# Patient Record
Sex: Male | Born: 1945 | Race: White | Hispanic: No | Marital: Married | Smoking: Former smoker
Health system: Southern US, Community
[De-identification: ages and names within clinical notes are randomized; demographics above are authoritative.]

## PROBLEM LIST (undated history)

## (undated) DIAGNOSIS — D649 Anemia, unspecified: Secondary | ICD-10-CM

## (undated) DIAGNOSIS — E46 Unspecified protein-calorie malnutrition: Secondary | ICD-10-CM

## (undated) DIAGNOSIS — J189 Pneumonia, unspecified organism: Secondary | ICD-10-CM

## (undated) DIAGNOSIS — I2699 Other pulmonary embolism without acute cor pulmonale: Secondary | ICD-10-CM

## (undated) DIAGNOSIS — Z8489 Family history of other specified conditions: Secondary | ICD-10-CM

## (undated) DIAGNOSIS — IMO0001 Reserved for inherently not codable concepts without codable children: Secondary | ICD-10-CM

## (undated) DIAGNOSIS — K269 Duodenal ulcer, unspecified as acute or chronic, without hemorrhage or perforation: Secondary | ICD-10-CM

## (undated) DIAGNOSIS — A0472 Enterocolitis due to Clostridium difficile, not specified as recurrent: Secondary | ICD-10-CM

## (undated) DIAGNOSIS — IMO0002 Reserved for concepts with insufficient information to code with codable children: Secondary | ICD-10-CM

## (undated) DIAGNOSIS — C3490 Malignant neoplasm of unspecified part of unspecified bronchus or lung: Secondary | ICD-10-CM

## (undated) DIAGNOSIS — K823 Fistula of gallbladder: Secondary | ICD-10-CM

## (undated) DIAGNOSIS — E119 Type 2 diabetes mellitus without complications: Secondary | ICD-10-CM

## (undated) DIAGNOSIS — E7800 Pure hypercholesterolemia, unspecified: Secondary | ICD-10-CM

## (undated) DIAGNOSIS — Z828 Family history of other disabilities and chronic diseases leading to disablement, not elsewhere classified: Secondary | ICD-10-CM

## (undated) DIAGNOSIS — K3184 Gastroparesis: Secondary | ICD-10-CM

## (undated) DIAGNOSIS — R945 Abnormal results of liver function studies: Secondary | ICD-10-CM

## (undated) DIAGNOSIS — C439 Malignant melanoma of skin, unspecified: Secondary | ICD-10-CM

## (undated) DIAGNOSIS — J449 Chronic obstructive pulmonary disease, unspecified: Secondary | ICD-10-CM

## (undated) DIAGNOSIS — R7989 Other specified abnormal findings of blood chemistry: Secondary | ICD-10-CM

## (undated) HISTORY — DX: Enterocolitis due to Clostridium difficile, not specified as recurrent: A04.72

## (undated) HISTORY — PX: PYLOROPLASTY: SHX418

## (undated) HISTORY — PX: OTHER SURGICAL HISTORY: SHX169

## (undated) HISTORY — DX: Pure hypercholesterolemia, unspecified: E78.00

## (undated) HISTORY — DX: Abnormal results of liver function studies: R94.5

## (undated) HISTORY — DX: Duodenal ulcer, unspecified as acute or chronic, without hemorrhage or perforation: K26.9

## (undated) HISTORY — DX: Pneumonia, unspecified organism: J18.9

## (undated) HISTORY — DX: Other pulmonary embolism without acute cor pulmonale: I26.99

## (undated) HISTORY — DX: Anemia, unspecified: D64.9

## (undated) HISTORY — PX: JEJUNOSTOMY FEEDING TUBE: SUR737

## (undated) HISTORY — DX: Unspecified protein-calorie malnutrition: E46

## (undated) HISTORY — PX: CHOLECYSTECTOMY: SHX55

## (undated) HISTORY — DX: Other specified abnormal findings of blood chemistry: R79.89

## (undated) HISTORY — DX: Malignant melanoma of skin, unspecified: C43.9

## (undated) HISTORY — DX: Fistula of gallbladder: K82.3

## (undated) HISTORY — DX: Chronic obstructive pulmonary disease, unspecified: J44.9

## (undated) HISTORY — DX: Type 2 diabetes mellitus without complications: E11.9

---

## 2014-06-27 ENCOUNTER — Telehealth: Payer: Self-pay | Admitting: Internal Medicine

## 2014-06-27 ENCOUNTER — Ambulatory Visit (INDEPENDENT_AMBULATORY_CARE_PROVIDER_SITE_OTHER): Payer: BC Managed Care – PPO | Admitting: Internal Medicine

## 2014-06-27 ENCOUNTER — Ambulatory Visit (INDEPENDENT_AMBULATORY_CARE_PROVIDER_SITE_OTHER)
Admission: RE | Admit: 2014-06-27 | Discharge: 2014-06-27 | Disposition: A | Discharge status: Home / Self Care | Payer: BC Managed Care – PPO | Source: Ambulatory Visit | Attending: Internal Medicine | Admitting: Internal Medicine

## 2014-06-27 ENCOUNTER — Encounter: Payer: Self-pay | Admitting: Internal Medicine

## 2014-06-27 VITALS — BP 114/76 | HR 88 | Temp 97.7°F | Ht 68.0 in | Wt 173.0 lb

## 2014-06-27 DIAGNOSIS — J189 Pneumonia, unspecified organism: Secondary | ICD-10-CM

## 2014-06-27 DIAGNOSIS — J449 Chronic obstructive pulmonary disease, unspecified: Secondary | ICD-10-CM

## 2014-06-27 MED ORDER — BUDESONIDE-FORMOTEROL FUMARATE 160-4.5 MCG/ACT IN AERO: INHALATION_SPRAY | RESPIRATORY_TRACT | Status: DC

## 2014-06-27 NOTE — Progress Notes (Signed)
   Subjective:    Patient ID: Daryl Vega, male    DOB: 1946/01/28  MRN: [id number]  HPI  76yowm quit smoking 06/12/14 referred by [facility] office 06/27/2014 for eval of ? Recurrent pna   06/27/2014 1st [place] [facility] office visit/ Tikia Skilton  Chief Complaint  Patient presents with  . Pulmonary Consult    Referred per Dr. [name].  Pt states that he has had PNA x 2 recently. He c/o cough and SOB for the past 2 months.  Cough is now non prod.   all started 8-10  weeks ago prior to that  No limitation then fiancee got pna then pt got sick rx levaquin for ? pna but still still tired cough /congestion with nasty mucus then seen again with flare cough/fever rx 06/13/14 doxy/prednisone Last fever over a week prior to [place] c/o tired,  Some cough/wheezing but that's  Much better and breathing just about back to baseline  No obvious other patterns in day to day or daytime variabilty or assoc chronic cough or cp or chest tightness, subjective wheeze overt sinus or hb symptoms. No unusual exp hx or h/o childhood pna/ asthma or knowledge of premature birth.  Sleeping ok without nocturnal  or early am exacerbation  of respiratory  c/o's or need for noct saba. Also denies any obvious fluctuation of symptoms with weather or environmental changes or other aggravating or alleviating factors except as outlined above   Current Medications, Allergies, Complete Past Medical History, Past Surgical History, Family History, and Social History were reviewed in [employer] record.           Review of Systems  Constitutional: Negative for fever, chills, activity change, appetite change and unexpected weight change.  HENT: Positive for congestion. Negative for dental problem, postnasal drip, rhinorrhea, sneezing, sore throat, trouble swallowing and voice change.   Eyes: Negative for visual disturbance.  Respiratory: Negative for cough, choking and shortness of breath.     Cardiovascular: Negative for chest pain and leg swelling.  Gastrointestinal: Negative for nausea, vomiting and abdominal pain.  Genitourinary: Negative for difficulty urinating.  Musculoskeletal: Negative for arthralgias.  Skin: Negative for rash.  Psychiatric/Behavioral: Negative for behavioral problems and confusion.       Objective:   Physical Exam  amb wm nad  Wt Readings from Last 3 Encounters:  06/27/14 173 lb (78.472 kg)      HEENT mild turbinate edema.  Edentulous Oropharynx no thrush or excess pnd or cobblestoning.  No JVD or cervical adenopathy. Mild accessory muscle hypertrophy. Trachea midline, nl thryroid. Chest was hyperinflated by percussion with diminished breath sounds and moderate increased exp time without wheeze. Hoover sign positive at mid inspiration. Regular rate and rhythm without murmur gallop or rub or increase P2 or edema.  Abd: no hsm, nl excursion. Ext warm without cyanosis or clubbing.    cxr 06/13/14  Report: infiltrate thruout the RUL  CXR  06/27/2014 : Vague Ill-defined density in the medial right upper lung. A lesion cannot be excluded in this area. Based on the history of smoking, recommend further evaluation with a chest CT to exclude a lesion in this area.  Hyperinflation consistent with emphysematous changes.       Assessment & Plan:

## 2014-06-27 NOTE — Patient Instructions (Addendum)
Symbicort 160 Take 2 puffs first thing in am and then another 2 puffs about 12 hours later.   Work on inhaler technique:  relax and gently blow all the way out then take a nice smooth deep breath back in, triggering the inhaler at same time you start breathing in.  Hold for up to 5 seconds if you can.  Rinse and gargle with water when done  Please remember to go to the  x-ray department downstairs for your tests - we will call you with the results when they are available.  Please schedule a follow up office visit in 2 weeks, sooner if needed

## 2014-06-27 NOTE — Telephone Encounter (Signed)
Spoke with Daryl Vega, she is informing MW of call report on 7/14 cxr: IMPRESSION:  Ill-defined density in the medial right upper lung. A lesion cannot  be excluded in this area. Based on the history of smoking, recommend  further evaluation with a chest CT to exclude a lesion in this area.  Hyperinflation consistent with emphysematous changes.   MW please advise, thank you.

## 2014-06-28 DIAGNOSIS — J449 Chronic obstructive pulmonary disease, unspecified: Secondary | ICD-10-CM | POA: Insufficient documentation

## 2014-06-28 NOTE — Assessment & Plan Note (Signed)
?   Onset, confirmed on cxr from [facility] on 06/13/14 involving the entire RUL by report > min residual medially on 06/27/14   without comparisons in system  He is by report much improved but not back to baseline and almost completely better symptomatically so best bet is just to follow radiographically for now and work on airway symtoms - see copd

## 2014-06-28 NOTE — Progress Notes (Signed)
Quick Note:  Spoke with pt and notified of results per Dr. Wert. Pt verbalized understanding and denied any questions.  ______ 

## 2014-06-28 NOTE — Assessment & Plan Note (Signed)
DDX of  difficult airways management all start with A and  include Adherence, Ace Inhibitors, Acid Reflux, Active Sinus Disease, Alpha 1 Antitripsin deficiency, Anxiety masquerading as Airways dz,  ABPA,  allergy(esp in young), Aspiration (esp in elderly), Adverse effects of DPI,  Active smokers, plus two Bs  = Bronchiectasis and Beta blocker use..and one C= CHF  Adherence is always the initial "prime suspect" and is a multilayered concern that requires a "trust but verify" approach in every patient - starting with knowing how to use medications, especially inhalers, correctly, keeping up with refills and understanding the fundamental difference between maintenance and prns vs those medications only taken for a very short course and then stopped and not refilled.  The proper method of use, as well as anticipated side effects, of a metered-dose inhaler are discussed and demonstrated to the patient. Improved effectiveness after extensive coaching during this visit to a level of approximately  75% so rec symbiocrt 160 2 bid trial  ? Active smoking > says not.

## 2014-07-11 ENCOUNTER — Ambulatory Visit (INDEPENDENT_AMBULATORY_CARE_PROVIDER_SITE_OTHER)
Admission: RE | Admit: 2014-07-11 | Discharge: 2014-07-11 | Disposition: A | Discharge status: Home / Self Care | Payer: BC Managed Care – PPO | Source: Ambulatory Visit | Attending: Internal Medicine | Admitting: Internal Medicine

## 2014-07-11 ENCOUNTER — Encounter: Payer: Self-pay | Admitting: Internal Medicine

## 2014-07-11 ENCOUNTER — Ambulatory Visit (INDEPENDENT_AMBULATORY_CARE_PROVIDER_SITE_OTHER): Payer: BC Managed Care – PPO | Admitting: Internal Medicine

## 2014-07-11 VITALS — BP 118/70 | HR 88 | Temp 98.8°F | Ht 69.0 in | Wt 175.0 lb

## 2014-07-11 DIAGNOSIS — J449 Chronic obstructive pulmonary disease, unspecified: Secondary | ICD-10-CM

## 2014-07-11 DIAGNOSIS — J189 Pneumonia, unspecified organism: Secondary | ICD-10-CM

## 2014-07-11 NOTE — Patient Instructions (Addendum)
Please remember to go to the   x-ray department downstairs for your tests - we will call you with the results when they are available.  Continue symbicort 160 Take 2 puffs first thing in am and then another 2 puffs about 12 hours later.   Work on inhaler technique:  relax and gently blow all the way out then take a nice smooth deep breath back in, triggering the inhaler at same time you start breathing in.  Hold for up to 5 seconds if you can.  Rinse and gargle with water when done.  Congratulations on not smoking, this is the most important aspect of your care! Late add : rec Ct with contrast and sinus ct

## 2014-07-11 NOTE — Progress Notes (Signed)
Subjective:    Patient ID: Daryl Vega, male    DOB: Feb 14, 1946  MRN: [id number]    Brief patient profile:  67yowm quit smoking 06/12/14 referred by [facility] office 06/27/2014 for eval of ? Recurrent pna   06/27/2014 1st [place] [facility] office visit/ Daryl Vega  Chief Complaint  Patient presents with  . Pulmonary Consult    Referred per Dr. [name].  Pt states that he has had PNA x 2 recently. He c/o cough and SOB for the past 2 months.  Cough is now non prod.   all started 8-10  weeks ago prior to that  No limitation then fiancee got pna then pt got sick rx levaquin for ? pna but still still tired cough /congestion with nasty mucus then seen again with flare cough/fever rx 06/13/14 doxy/prednisone Last fever over a week prior to OV   rec Symbicort 160 Take 2 puffs first thing in am and then another 2 puffs about 12 hours later.  Work on inhaler technique  Please remember to go to the  x-ray department downstairs for your tests    07/11/2014 f/u ov/Daryl Vega re: f/u pna  Chief Complaint  Patient presents with  . Follow-up    Pt states that his SOB and cough have improved some, but not resovled. Cough is non prod. He c/o rhinitis x 3 days.    Main c/o tired,  Some cough/wheezing but that's  Much better and breathing just about back to baseline  No obvious day to day or daytime variabilty or assoc chronic cough or cp or chest tightness, subjective wheeze overt sinus or hb symptoms. No unusual exp hx or h/o childhood pna/ asthma or knowledge of premature birth.  Sleeping ok without nocturnal  or early am exacerbation  of respiratory  c/o's or need for noct saba. Also denies any obvious fluctuation of symptoms with weather or environmental changes or other aggravating or alleviating factors except as outlined above   Current Medications, Allergies, Complete Past Medical History, Past Surgical History, Family History, and Social History were reviewed in [employer] record.  ROS  The following are not active complaints unless bolded sore throat, dysphagia, dental problems, itching, sneezing,  nasal congestion or excess/ purulent secretions, ear ache,   fever, chills, sweats, unintended wt loss, pleuritic or exertional cp, hemoptysis,  orthopnea pnd or leg swelling, presyncope, palpitations, heartburn, abdominal pain, anorexia, nausea, vomiting, diarrhea  or change in bowel or urinary habits, change in stools or urine, dysuria,hematuria,  rash, arthralgias, visual complaints, headache, numbness weakness or ataxia or problems with walking or coordination,  change in mood/affect or memory.                      Objective:   Physical Exam  amb wm nad  Wt Readings from Last 3 Encounters:  07/11/14 175 lb (79.379 kg)  06/27/14 173 lb (78.472 kg)         HEENT mild turbinate edema.  Edentulous Oropharynx no thrush or excess pnd or cobblestoning.  No JVD or cervical adenopathy. Mild accessory muscle hypertrophy. Trachea midline, nl thryroid. Chest was hyperinflated by percussion with diminished breath sounds and moderate increased exp time with insp/exp rhonchi R > L anteriorly. Hoover sign positive at mid inspiration. Regular rate and rhythm without murmur gallop or rub or increase P2 or edema.  Abd: no hsm, nl excursion. Ext warm without cyanosis or clubbing.      CXR  07/11/2014 :  Persistent RUL density medially       Assessment & Plan:

## 2014-07-11 NOTE — Assessment & Plan Note (Addendum)
DDX of  difficult airways management all start with A and  include Adherence, Ace Inhibitors, Acid Reflux, Active Sinus Disease, Alpha 1 Antitripsin deficiency, Anxiety masquerading as Airways dz,  ABPA,  allergy(esp in young), Aspiration (esp in elderly), Adverse effects of DPI,  Active smokers, plus two Bs  = Bronchiectasis and Beta blocker use..and one C= CHF  Adherence is always the initial "prime suspect" and is a multilayered concern that requires a "trust but verify" approach in every patient - starting with knowing how to use medications, especially inhalers, correctly, keeping up with refills and understanding the fundamental difference between maintenance and prns vs those medications only taken for a very short course and then stopped and not refilled.  The proper method of use, as well as anticipated side effects, of a metered-dose inhaler are discussed and demonstrated to the patient. Improved effectiveness after extensive coaching during this visit to a level of approximately  75% so needs to keep working on it.   ? Active smoking > denies x 4 weeks only, reinforced  ? Active sinus dz > check sinus CT next

## 2014-07-11 NOTE — Assessment & Plan Note (Signed)
?   Onset, confirmed on cxr from [facility] on 06/13/14 involving the entire RUL by report > min residual medially on 06/27/14   without comparisons in system> persistent density on cxr  07/11/2014 > CT chest rec

## 2014-07-12 ENCOUNTER — Other Ambulatory Visit: Payer: Self-pay | Admitting: Internal Medicine

## 2014-07-12 DIAGNOSIS — J189 Pneumonia, unspecified organism: Secondary | ICD-10-CM

## 2014-07-12 NOTE — Progress Notes (Signed)
Quick Note:  Spoke with pt and notified of results per Dr. Melvyn Novas. Pt verbalized understanding and denied any questions. Orders sent to [facility] ______

## 2014-07-17 ENCOUNTER — Ambulatory Visit (INDEPENDENT_AMBULATORY_CARE_PROVIDER_SITE_OTHER)
Admission: RE | Admit: 2014-07-17 | Discharge: 2014-07-17 | Disposition: A | Discharge status: Home / Self Care | Payer: BC Managed Care – PPO | Source: Ambulatory Visit | Attending: Internal Medicine | Admitting: Internal Medicine

## 2014-07-17 ENCOUNTER — Other Ambulatory Visit (INDEPENDENT_AMBULATORY_CARE_PROVIDER_SITE_OTHER): Payer: BC Managed Care – PPO

## 2014-07-17 ENCOUNTER — Encounter: Payer: Self-pay | Admitting: Internal Medicine

## 2014-07-17 DIAGNOSIS — R059 Cough, unspecified: Secondary | ICD-10-CM | POA: Insufficient documentation

## 2014-07-17 DIAGNOSIS — J189 Pneumonia, unspecified organism: Secondary | ICD-10-CM

## 2014-07-17 DIAGNOSIS — R05 Cough: Secondary | ICD-10-CM | POA: Insufficient documentation

## 2014-07-17 LAB — BASIC METABOLIC PANEL
BUN: 12 mg/dL (ref 6–23)
CHLORIDE: 108 meq/L (ref 96–112)
CO2: 24 mEq/L (ref 19–32)
Calcium: 9.2 mg/dL (ref 8.4–10.5)
Creatinine, Ser: 0.9 mg/dL (ref 0.4–1.5)
GFR: 85.09 mL/min (ref >60.00)
Glucose, Bld: 128 mg/dL — ABNORMAL HIGH (ref 70–99)
POTASSIUM: 4.4 meq/L (ref 3.5–5.1)
Sodium: 142 mEq/L (ref 135–145)

## 2014-07-17 MED ORDER — IOHEXOL 300 MG/ML  SOLN
80.0000 mL | Freq: Once | INTRAMUSCULAR | Status: AC | PRN
  Administered 2014-07-17: 80 mL via INTRAVENOUS

## 2014-07-20 ENCOUNTER — Ambulatory Visit (HOSPITAL_COMMUNITY)
Admission: RE | Admit: 2014-07-20 | Discharge: 2014-07-20 | Disposition: A | Discharge status: Home / Self Care | Payer: BC Managed Care – PPO | Source: Ambulatory Visit | Attending: Internal Medicine | Admitting: Internal Medicine

## 2014-07-20 ENCOUNTER — Encounter (HOSPITAL_COMMUNITY)
Admission: RE | Disposition: A | Discharge status: Home / Self Care | Payer: Self-pay | Source: Ambulatory Visit | Attending: Internal Medicine

## 2014-07-20 DIAGNOSIS — J449 Chronic obstructive pulmonary disease, unspecified: Secondary | ICD-10-CM | POA: Insufficient documentation

## 2014-07-20 DIAGNOSIS — J4489 Other specified chronic obstructive pulmonary disease: Secondary | ICD-10-CM | POA: Insufficient documentation

## 2014-07-20 DIAGNOSIS — C3410 Malignant neoplasm of upper lobe, unspecified bronchus or lung: Secondary | ICD-10-CM | POA: Diagnosis not present

## 2014-07-20 DIAGNOSIS — R918 Other nonspecific abnormal finding of lung field: Secondary | ICD-10-CM

## 2014-07-20 DIAGNOSIS — R222 Localized swelling, mass and lump, trunk: Secondary | ICD-10-CM | POA: Diagnosis present

## 2014-07-20 HISTORY — PX: VIDEO BRONCHOSCOPY: SHX5072

## 2014-07-20 LAB — GLUCOSE, CAPILLARY: Glucose-Capillary: 163 mg/dL — ABNORMAL HIGH (ref 70–99)

## 2014-07-20 SURGERY — VIDEO BRONCHOSCOPY WITHOUT FLUORO
Anesthesia: Moderate Sedation | Laterality: Bilateral

## 2014-07-20 MED ORDER — MIDAZOLAM HCL 5 MG/ML IJ SOLN: 1.0000 mg | Freq: Once | INTRAMUSCULAR | Status: DC

## 2014-07-20 MED ORDER — LIDOCAINE HCL 1 % IJ SOLN
INTRAMUSCULAR | Status: DC | PRN
  Administered 2014-07-20: 6 mL via RESPIRATORY_TRACT

## 2014-07-20 MED ORDER — MEPERIDINE HCL 25 MG/ML IJ SOLN
INTRAMUSCULAR | Status: DC | PRN
  Administered 2014-07-20: 50 mg via INTRAVENOUS

## 2014-07-20 MED ORDER — MEPERIDINE HCL 100 MG/ML IJ SOLN
INTRAMUSCULAR | Status: AC
  Filled 2014-07-20: qty 2

## 2014-07-20 MED ORDER — LIDOCAINE HCL 2 % EX GEL
CUTANEOUS | Status: DC | PRN
  Administered 2014-07-20: 1

## 2014-07-20 MED ORDER — EPINEPHRINE HCL 0.1 MG/ML IJ SOSY
PREFILLED_SYRINGE | INTRAMUSCULAR | Status: DC | PRN
  Administered 2014-07-20: 1 via ENDOTRACHEOPULMONARY

## 2014-07-20 MED ORDER — MEPERIDINE HCL 100 MG/ML IJ SOLN: 100.0000 mg | Freq: Once | INTRAMUSCULAR | Status: DC

## 2014-07-20 MED ORDER — SODIUM CHLORIDE 0.9 % IV SOLN
INTRAVENOUS | Status: DC
  Administered 2014-07-20: 08:00:00 via INTRAVENOUS

## 2014-07-20 MED ORDER — PHENYLEPHRINE HCL 0.25 % NA SOLN: 1.0000 | Freq: Four times a day (QID) | NASAL | Status: DC | PRN

## 2014-07-20 MED ORDER — MIDAZOLAM HCL 10 MG/2ML IJ SOLN
INTRAMUSCULAR | Status: AC
  Filled 2014-07-20: qty 4

## 2014-07-20 MED ORDER — LIDOCAINE HCL 2 % EX GEL: 1.0000 "application " | Freq: Once | CUTANEOUS | Status: DC

## 2014-07-20 MED ORDER — MIDAZOLAM HCL 10 MG/2ML IJ SOLN
INTRAMUSCULAR | Status: DC | PRN
  Administered 2014-07-20 (×2): 2.5 mg via INTRAVENOUS

## 2014-07-20 MED ORDER — PHENYLEPHRINE HCL 0.25 % NA SOLN
NASAL | Status: DC | PRN
  Administered 2014-07-20: 2 via NASAL

## 2014-07-20 NOTE — Progress Notes (Signed)
Video bronchoscopy performed Intervention bronchial washings Intervention bronchial biopsy Pt tolerated well  Kathie Dike RRT

## 2014-07-20 NOTE — Discharge Instructions (Signed)
Flexible Bronchoscopy, Care After These instructions give you information on caring for yourself after your procedure. Your doctor may also give you more specific instructions. Call your doctor if you have any problems or questions after your procedure. HOME CARE  Do not eat or drink anything for 2 hours after your procedure. If you try to eat or drink before the medicine wears off, food or drink could go into your lungs. You could also burn yourself.  After 2 hours have passed and when you can cough and gag normally, you may eat soft food and drink liquids slowly.  The day after the test, you may eat your normal diet.  You may do your normal activities.  Keep all doctor visits. GET HELP RIGHT AWAY IF:  You get more and more short of breath.  You get light-headed.  You feel like you are going to pass out (faint).  You have chest pain.  You have new problems that worry you.  You cough up more than a little blood.  You cough up more blood than before. MAKE SURE YOU:  Understand these instructions.  Will watch your condition.  Will get help right away if you are not doing well or get worse. Document Released: 09/28/2009 Document Revised: 12/06/2013 Document Reviewed: 08/05/2013 [facility] Patient Information 2015 [place], [place]. This information is not intended to replace advice given to you by your health care provider. Make sure you discuss any questions you have with your health care provider.  Do not eat or drink until after 1015 today 07/20/2014

## 2014-07-20 NOTE — Op Note (Signed)
Bronchoscopy Procedure Note  Date of Operation: 07/20/2014   Pre-op Diagnosis: Lung mass  Post-op Diagnosis: same  Surgeon: Christinia Gully  Anesthesia: Monitored Local Anesthesia with Sedation  Operation: Video Flexible fiberoptic bronchoscopy, diagnostic   Findings: exophytic mass obst RUL and extending to R lat tracheal wall and 50% obst BI  Specimen: washings rul/ endbronchial bx x 6  Estimated Blood Loss: 50 cc   Complications: mod bleeding controlled with epi  Indications and History: See updated H and P same date. The risks, benefits, complications, treatment options and expected outcomes were discussed with the patient.  The possibilities of reaction to medication, pulmonary aspiration, perforation of a viscus, bleeding, failure to diagnose a condition and creating a complication requiring transfusion or operation were discussed with the patient who freely signed the consent.    Description of Procedure: The patient was re-examined in the bronchoscopy suite and the site of surgery properly noted/marked.  The patient was identified  and the procedure verified as Flexible Fiberoptic Bronchoscopy.  A Time Out was held and the above information confirmed.   After the induction of topical nasopharyngeal anesthesia, the patient was positioned  and the bronchoscope was passed through the R naris. The vocal cords were visualized and  1% buffered lidocaine 5 ml was topically placed onto the cords. The cords were nl. The scope was then passed into the trachea.  1% buffered lidocaine given topically. Airways inspected bilaterally to the subsegmental level with the following findings:  All airways nl x exophytic mass obst RUL 100% and extending proximally to R lat wall of trachea and to BI with 50% obst  Procedures 1) Washings RUL 2) Endobronchial bx RUL x 6, bleeding controlled with topical epi     The Patient was taken to the Endoscopy Recovery area in satisfactory  condition.  Attestation: I performed the procedure.  Christinia Gully, MD Pulmonary and [place] [phone] After 5:30 PM or weekends, call [phone]

## 2014-07-20 NOTE — Progress Notes (Signed)
Patient off oxygen for 30 minutes, O2 sats range 88-93% ,spoken with Dr Melvyn Novas ok to discharge.

## 2014-07-20 NOTE — H&P (Signed)
Brief patient profile:  67yowm quit smoking 06/12/14 referred by [facility] office 06/27/2014 for eval of ? Recurrent pna     06/27/2014 1st [place] [facility] office visit/ Rhyanna Sorce   Chief Complaint   Patient presents with   .  Pulmonary Consult       Referred per Dr. [name].  Pt states that he has had PNA x 2 recently. He c/o cough and SOB for the past 2 months.  Cough is now non prod.     all started 8-10  Weeks.prior to OV  -  No limitations then fiancee got pna then pt got sick rx levaquin for ? pna but still still tired cough /congestion with nasty mucus then seen again with flare cough/fever rx 06/13/14 doxy/prednisone Last fever over a week prior to OV    rec Symbicort 160 Take 2 puffs first thing in am and then another 2 puffs about 12 hours later.   Work on inhaler technique   Please remember to go to the  x-ray department downstairs for your tests       07/11/2014 f/u ov/Aram Domzalski re: f/u pna   Chief Complaint   Patient presents with   .  Follow-up       Pt states that his SOB and cough have improved some, but not resovled. Cough is non prod. He c/o rhinitis x 3 days.       Main c/o tired,  Some cough/wheezing but that's  Much better and breathing just about back to baseline   No obvious day to day or daytime variabilty or assoc chronic cough or cp or chest tightness, subjective wheeze overt sinus or hb symptoms. No unusual exp hx or h/o childhood pna/ asthma or knowledge of premature birth.   Sleeping ok without nocturnal  or early am exacerbation  of respiratory  c/o's or need for noct saba. Also denies any obvious fluctuation of symptoms with weather or environmental changes or other aggravating or alleviating factors except as outlined above    Current Medications, Allergies, Complete Past Medical History, Past Surgical History, Family History, and Social History were reviewed in [employer] record.   ROS  The following are not active  complaints unless bolded sore throat, dysphagia, dental problems, itching, sneezing,  nasal congestion or excess/ purulent secretions, ear ache,   fever, chills, sweats, unintended wt loss, pleuritic or exertional cp, hemoptysis,  orthopnea pnd or leg swelling, presyncope, palpitations, heartburn, abdominal pain, anorexia, nausea, vomiting, diarrhea  or change in bowel or urinary habits, change in stools or urine, dysuria,hematuria,  rash, arthralgias, visual complaints, headache, numbness weakness or ataxia or problems with walking or coordination,  change in mood/affect or memory.                            Objective:     Physical Exam   amb wm nad    Wt Readings from Last 3 Encounters:   07/11/14  175 lb (79.379 kg)   06/27/14  173 lb (78.472 kg)             HEENT mild turbinate edema.  Edentulous Oropharynx no thrush or excess pnd or cobblestoning.  No JVD or cervical adenopathy. Mild accessory muscle hypertrophy. Trachea midline, nl thryroid. Chest was hyperinflated by percussion with diminished breath sounds and moderate increased exp time with insp/exp rhonchi R > L anteriorly. Hoover sign positive at mid inspiration. Regular rate and rhythm without murmur  gallop or rub or increase P2 or edema.  Abd: no hsm, nl excursion. Ext warm without cyanosis or clubbing.         CXR  07/11/2014 :   Persistent RUL density medially          Assessment & Plan:               COPD mixed type    DDX of  difficult airways management all start with A and  include Adherence, Ace Inhibitors, Acid Reflux, Active Sinus Disease, Alpha 1 Antitripsin deficiency, Anxiety masquerading as Airways dz,  ABPA,  allergy(esp in young), Aspiration (esp in elderly), Adverse effects of DPI,  Active smokers, plus two Bs  = Bronchiectasis and Beta blocker use..and one C= CHF   Adherence is always the initial "prime suspect" and is a multilayered concern that requires a "trust but verify"  approach in every patient - starting with knowing how to use medications, especially inhalers, correctly, keeping up with refills and understanding the fundamental difference between maintenance and prns vs those medications only taken for a very short course and then stopped and not refilled.   The proper method of use, as well as anticipated side effects, of a metered-dose inhaler are discussed and demonstrated to the patient. Improved effectiveness after extensive coaching during this visit to a level of approximately  75% so needs to keep working on it.    ? Active smoking > denies x 4 weeks only, reinforced   ? Active sinus dz > check sinus CT next            CAP (community acquired pneumonia) -     ? Onset, confirmed on cxr from [facility] on 06/13/14 involving the entire RUL by report > min residual medially on 06/27/14   without comparisons in system> persistent density on cxr  07/11/2014 > CT chest rec                             Patient Instructions      Please remember to go to the   x-ray department downstairs for your tests - we will call you with the results when they are available.   Continue symbicort 160 Take 2 puffs first thing in am and then another 2 puffs about 12 hours later.    Work on inhaler technique:  relax and gently blow all the way out then take a nice smooth deep breath back in, triggering the inhaler at same time you start breathing in.  Hold for up to 5 seconds if you can.  Rinse and gargle with water when done.   Congratulations on not smoking, this is the most important aspect of your care! Late add : rec Ct with contrast and sinus ct > c/w RUL obst  Discussed in detail all the  indications, usual  risks and alternatives  relative to the benefits with patient who agrees to proceed with bronchoscopy with biopsy.     07/20/2014 Day of FOB, no change hx or exam - does take prn ibuprofen - last dose 4 am x 2    Christinia Gully, MD Pulmonary and [place] [phone] After 5:30 PM or weekends, call [phone]

## 2014-07-21 ENCOUNTER — Encounter (HOSPITAL_COMMUNITY): Payer: Self-pay | Admitting: Internal Medicine

## 2014-07-21 ENCOUNTER — Telehealth: Payer: Self-pay | Admitting: *Deleted

## 2014-07-21 ENCOUNTER — Other Ambulatory Visit: Payer: Self-pay | Admitting: Internal Medicine

## 2014-07-21 DIAGNOSIS — R918 Other nonspecific abnormal finding of lung field: Secondary | ICD-10-CM

## 2014-07-21 NOTE — Telephone Encounter (Signed)
Called pt with appt for [place] on 07/27/14 at 1:30 labs 2:00 Dr. Julien Nordmann and 3:00 with Rad Onc.  He verbalized understanding of appt time and place.

## 2014-07-24 ENCOUNTER — Other Ambulatory Visit: Payer: Self-pay | Admitting: Medical Oncology

## 2014-07-24 DIAGNOSIS — R918 Other nonspecific abnormal finding of lung field: Secondary | ICD-10-CM

## 2014-07-26 ENCOUNTER — Telehealth: Payer: Self-pay | Admitting: *Deleted

## 2014-07-26 ENCOUNTER — Encounter (HOSPITAL_COMMUNITY)
Admission: RE | Admit: 2014-07-26 | Discharge: 2014-07-26 | Disposition: A | Discharge status: Home / Self Care | Payer: BC Managed Care – PPO | Source: Ambulatory Visit | Attending: Internal Medicine | Admitting: Internal Medicine

## 2014-07-26 ENCOUNTER — Telehealth: Payer: Self-pay | Admitting: Internal Medicine

## 2014-07-26 ENCOUNTER — Encounter: Payer: Self-pay | Admitting: Internal Medicine

## 2014-07-26 DIAGNOSIS — R918 Other nonspecific abnormal finding of lung field: Secondary | ICD-10-CM

## 2014-07-26 DIAGNOSIS — R222 Localized swelling, mass and lump, trunk: Secondary | ICD-10-CM | POA: Insufficient documentation

## 2014-07-26 LAB — GLUCOSE, CAPILLARY: GLUCOSE-CAPILLARY: 151 mg/dL — AB (ref 70–99)

## 2014-07-26 MED ORDER — AMOXICILLIN-POT CLAVULANATE 875-125 MG PO TABS: 1.0000 | ORAL_TABLET | Freq: Two times a day (BID) | ORAL | Status: DC

## 2014-07-26 MED ORDER — FLUDEOXYGLUCOSE F - 18 (FDG) INJECTION
8.5000 | Freq: Once | INTRAVENOUS | Status: AC | PRN
  Administered 2014-07-26: 8.5 via INTRAVENOUS

## 2014-07-26 NOTE — Telephone Encounter (Signed)
Augmentin 875 mg take one pill twice daily  X 10 days - take at breakfast and supper with large glass of water.  It would help reduce the usual side effects (diarrhea and yeast infections) if you ate cultured yogurt at lunch.   mucinex dm 2 q12 h prn, use tussionex only as a backup to mucinex dm

## 2014-07-26 NOTE — Telephone Encounter (Signed)
Spoke with the pt to give PET scan results  He verbalized understanding of these results  He states "feels like I am getting PNA again" C/o prod cough with moderate green sputum that started just yesterday  Had some CP with cough yesterday  No fever  Please advise any recs thanks! No Known Allergies

## 2014-07-26 NOTE — Telephone Encounter (Signed)
Called and spoke with pt and he is aware of MW recs.  Pt voiced his understanding of MW recs and will take the medications as requested.

## 2014-07-26 NOTE — Progress Notes (Signed)
Quick Note:  Spoke with the pt and notified of results ______ 

## 2014-07-26 NOTE — Telephone Encounter (Signed)
Called pt to update him regarding appt time.  He verbalized understanding.

## 2014-07-27 ENCOUNTER — Telehealth: Payer: Self-pay | Admitting: Internal Medicine

## 2014-07-27 ENCOUNTER — Other Ambulatory Visit: Payer: BC Managed Care – PPO

## 2014-07-27 ENCOUNTER — Encounter: Payer: Self-pay | Admitting: Radiation Oncology

## 2014-07-27 ENCOUNTER — Encounter: Payer: BC Managed Care – PPO | Admitting: *Deleted

## 2014-07-27 ENCOUNTER — Encounter: Payer: Self-pay | Admitting: *Deleted

## 2014-07-27 ENCOUNTER — Ambulatory Visit
Admission: RE | Admit: 2014-07-27 | Discharge: 2014-07-27 | Disposition: A | Discharge status: Home / Self Care | Payer: BC Managed Care – PPO | Source: Ambulatory Visit | Attending: Radiation Oncology | Admitting: Radiation Oncology

## 2014-07-27 ENCOUNTER — Encounter: Payer: Self-pay | Admitting: Internal Medicine

## 2014-07-27 ENCOUNTER — Ambulatory Visit: Payer: BC Managed Care – PPO | Attending: Physical Therapy | Admitting: Physical Therapy

## 2014-07-27 ENCOUNTER — Ambulatory Visit: Payer: BC Managed Care – PPO | Admitting: Internal Medicine

## 2014-07-27 ENCOUNTER — Ambulatory Visit (HOSPITAL_BASED_OUTPATIENT_CLINIC_OR_DEPARTMENT_OTHER): Payer: BC Managed Care – PPO | Admitting: Internal Medicine

## 2014-07-27 VITALS — BP 102/68 | HR 117 | Temp 99.2°F | Resp 21 | Ht 69.0 in | Wt 168.5 lb

## 2014-07-27 DIAGNOSIS — F17200 Nicotine dependence, unspecified, uncomplicated: Secondary | ICD-10-CM

## 2014-07-27 DIAGNOSIS — C3410 Malignant neoplasm of upper lobe, unspecified bronchus or lung: Secondary | ICD-10-CM

## 2014-07-27 DIAGNOSIS — C771 Secondary and unspecified malignant neoplasm of intrathoracic lymph nodes: Secondary | ICD-10-CM

## 2014-07-27 DIAGNOSIS — IMO0001 Reserved for inherently not codable concepts without codable children: Secondary | ICD-10-CM | POA: Insufficient documentation

## 2014-07-27 DIAGNOSIS — Z87891 Personal history of nicotine dependence: Secondary | ICD-10-CM | POA: Diagnosis not present

## 2014-07-27 DIAGNOSIS — R52 Pain, unspecified: Secondary | ICD-10-CM

## 2014-07-27 DIAGNOSIS — C3490 Malignant neoplasm of unspecified part of unspecified bronchus or lung: Secondary | ICD-10-CM | POA: Diagnosis not present

## 2014-07-27 DIAGNOSIS — R5381 Other malaise: Secondary | ICD-10-CM | POA: Insufficient documentation

## 2014-07-27 DIAGNOSIS — R918 Other nonspecific abnormal finding of lung field: Secondary | ICD-10-CM

## 2014-07-27 DIAGNOSIS — C770 Secondary and unspecified malignant neoplasm of lymph nodes of head, face and neck: Secondary | ICD-10-CM

## 2014-07-27 DIAGNOSIS — C3411 Malignant neoplasm of upper lobe, right bronchus or lung: Secondary | ICD-10-CM

## 2014-07-27 MED ORDER — HYDROCODONE-ACETAMINOPHEN 5-325 MG PO TABS: 1.0000 | ORAL_TABLET | Freq: Four times a day (QID) | ORAL | Status: DC | PRN

## 2014-07-27 NOTE — Progress Notes (Signed)
[place] [phone]   [phone] Multidisciplinary thoracic oncology clinic ([place])  CONSULT NOTE  REFERRING PHYSICIAN:Dr. Christinia Gully  REASON FOR CONSULTATION:  68 years old white male recently diagnosed with lung cancer.  HPI Daryl Vega is a 68 y.o. male was past medical history significant for pneumonia, COPD, diabetes mellitus as well as history of melanoma status post resection from the face and cheek in 2003. The patient also has long history of heavy smoking. The patient mentions that he had pneumonia twice over the last few months one in March of 2015 and the other one was in June of 2015. Recently had chest x-ray on 06/28/2012 for evaluation of his persistent shortness of breath and weight loss. The chest x-ray showed ill-defined density in the medial right upper lung. He was referred to Dr. Melvyn Novas and CT scan of the chest with contrast was performed on 07/17/2014. It showed a right suprahilar mass is seen measuring 3.0 x 4.0 cm which encompasses the right mainstem bronchus and causes obstruction of the central right upper lobe bronchus. This is suspicious for primary bronchogenic carcinoma. There is ill-defined opacity in the anterior right upper lobe containing air bronchograms as well as other patchy and nodular areas of opacity in the right upper lobe. This is likely due to postobstructive pneumonitis, with neoplasm considered less likely. Small less than 1 cm right paratracheal mediastinal lymph nodes are  seen, largest measuring 7 mm. There is also a poorly defined focus of opacity with a somewhat nodular appearance in the anterior left lower lobe measuring 18 mm. On 07/20/2014 the patient underwent a video flexible fiberoptic bronchoscopy under the care of Dr. Melvyn Novas and it showed exophytic mass obstructing the right upper lobe and extending into the right lateral tracheal wall with 50% obstruction. Washing of the right upper lobe as well  as endobronchial biopsies were performed.  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 A PET scan was performed on 07/26/2014 and it showed hypermetabolic right hilar mass was hypermetabolic See the total mediastinum and right supraclavicular lymph nodes, most consistent with primary bronchogenic carcinoma. Given the apparent distance of less than 2 CM from the carina the findings are worrisome for T3, N3, M0 lung cancer. There was also complete obstruction of the right upper lobe bronchus with marked narrowing of the bronchus intermedius and right lower lobe bronchus progressive postobstructive pneumonitis in the right upper lobe with new postobstructive changes in the right lower lobe.there was also persistent groundglass and nodular airspace disease in the left lower lobe. Dr. Melvyn Novas kindly referred the patient to me today for evaluation and recommendation regarding treatment of his condition. When seen today the patient continues to complain of cough productive of yellowish sputum with occasional blood-tinged sputum as well as shortness of breath at baseline and increased with exertion. He also has around 10 pounds of weight loss over the last 2 months. He denied having any significant nausea or vomiting or change in his bowel movement. The patient has occasional headache but no blurry vision.  Family history significant for a mother who had cancer of unknown type and heart disease. Father died during his sleep. The patient is single and was accompanied by his [place]. He has a son and daughter. He is a Administrator and currently off work. He has a history of smoking 2 pack per day for around 60 years and quit in June of 2015. He has no history of alcohol  or drug abuse.  HPI  Past Medical History  Diagnosis Date  . DM (diabetes mellitus)   . Pneumonia   . Hypercholesteremia   . Melanoma     Past Surgical History  Procedure Laterality Date  . Video  bronchoscopy Bilateral 07/20/2014    Procedure: VIDEO BRONCHOSCOPY WITHOUT FLUORO;  Surgeon: Tanda Rockers, MD;  Location: [facility];  Service: Cardiopulmonary;  Laterality: Bilateral;    Family History  Problem Relation Age of Onset  . Heart disease Mother   . Cancer Mother     ? type    Social History History  Substance Use Topics  . Smoking status: Former Smoker -- 2.00 packs/day for 50 years    Types: Cigarettes, Cigars    Quit date: 06/12/2014  . Smokeless tobacco: Current User    Types: Chew  . Alcohol Use: No    No Known Allergies  Current Outpatient Prescriptions  Medication Sig Dispense Refill  . atorvastatin (LIPITOR) 80 MG tablet Take 80 mg by mouth daily.      . budesonide-formoterol (SYMBICORT) 160-4.5 MCG/ACT inhaler Take 2 puffs first thing in am and then another 2 puffs about 12 hours later.      . chlorpheniramine-HYDROcodone (TUSSIONEX) 10-8 MG/5ML LQCR Take 5 mLs by mouth every 12 (twelve) hours as needed for cough.      . Dapagliflozin Propanediol (FARXIGA) 5 MG TABS Take 5 mg by mouth 2 (two) times daily.      [name] MILK THISTLE PO Take by mouth daily.      . Pseudoeph-Doxylamine-DM-APAP (NYQUIL PO) Take by mouth as needed.      [name] amoxicillin-clavulanate (AUGMENTIN) 875-125 MG per tablet Take 1 tablet by mouth 2 (two) times daily.  20 tablet  0  . metFORMIN (GLUCOPHAGE) 500 MG tablet Take 500 mg by mouth 2 (two) times daily with a meal.       No current facility-administered medications for this visit.    Review of Systems  Constitutional: positive for anorexia, fatigue and weight loss Eyes: negative Ears, nose, mouth, throat, and face: negative Respiratory: positive for cough, dyspnea on exertion, hemoptysis and pleurisy/chest pain Cardiovascular: negative Gastrointestinal: negative Genitourinary:negative Integument/breast: negative Hematologic/lymphatic: negative Musculoskeletal:negative Neurological: negative Behavioral/Psych:  negative Endocrine: negative Allergic/Immunologic: negative  Physical Exam  EPP:IRJJO, healthy, no distress, well nourished, well developed and anxious SKIN: skin color, texture, turgor are normal, no rashes or significant lesions HEAD: Normocephalic, No masses, lesions, tenderness or abnormalities EYES: normal, PERRLA EARS: External ears normal, Canals clear OROPHARYNX:no exudate, no erythema and lips, buccal mucosa, and tongue normal  NECK: supple, no adenopathy, no JVD LYMPH:  no palpable lymphadenopathy, no hepatosplenomegaly LUNGS: prolonged expiratory phase, expiratory wheezes bilaterally, scattered rales bilaterally HEART: regular rate & rhythm, no murmurs and no gallops ABDOMEN:abdomen soft, non-tender, normal bowel sounds and no masses or organomegaly BACK: Back symmetric, no curvature., No CVA tenderness EXTREMITIES:no joint deformities, effusion, or inflammation, no edema, no skin discoloration  NEURO: alert & oriented x 3 with fluent speech, no focal motor/sensory deficits  PERFORMANCE STATUS: ECOG 1-2  LABORATORY DATA: No results found for this basename: WBC, HGB, HCT, MCV, PLT      Chemistry      Component Value Date/Time   NA 142 07/17/2014 0904   K 4.4 07/17/2014 0904   CL 108 07/17/2014 0904   CO2 24 07/17/2014 0904   BUN 12 07/17/2014 0904   CREATININE 0.9 07/17/2014 0904      Component Value Date/Time   CALCIUM 9.2  07/17/2014 2458       RADIOGRAPHIC STUDIES: Dg Chest 2 View  07/12/2014   CLINICAL DATA:  Followup pneumonia, diabetes  EXAM: CHEST  2 VIEW  COMPARISON:  06/27/2014  FINDINGS: Cardiomediastinal silhouette is stable. Persistent ill-defined density in right upper lobe paratracheal. Further correlation with enhanced CT scan of the chest is recommended to exclude mass or adenopathy. Hyperinflation again noted. No segmental infiltrate or pulmonary edema.  IMPRESSION: Persistent ill-defined density in right upper lobe paratracheal suspicious for mass or  adenopathy. Further correlation with enhanced CT scan of the chest is again recommended. No segmental infiltrate or pulmonary edema.   Electronically Signed   By: Lahoma Crocker M.D.   On: 07/12/2014 08:20   Ct Chest W Contrast  07/17/2014   CLINICAL DATA:  Recent pneumonia. Persistent abnormal right lung opacity on chest radiograph.  EXAM: CT CHEST WITH CONTRAST  TECHNIQUE: Multidetector CT imaging of the chest was performed during intravenous contrast administration.  CONTRAST:  9mL OMNIPAQUE IOHEXOL 300 MG/ML  SOLN  COMPARISON:  Chest radiograph on 07/11/2014  FINDINGS: A right suprahilar mass is seen measuring 3.0 x 4.0 cm on image 25, which encompasses the right mainstem bronchus and causes obstruction of the central right upper lobe bronchus. This is suspicious for primary bronchogenic carcinoma.  There is ill-defined opacity in the anterior right upper lobe containing air bronchograms as well as other patchy and nodular areas of opacity in the right upper lobe. This is likely due to postobstructive pneumonitis, with neoplasm considered less likely.  Small less than 1 cm right paratracheal mediastinal lymph nodes are seen, largest measuring 7 mm on image 20. No pathologically enlarged nodes are seen elsewhere within the thorax. No evidence of pleural or pericardial effusion.  There is also a poorly defined focus of opacity with a somewhat nodular appearance in the anterior left lower lobe on image 36 measuring 18 mm. This may represent an infectious or inflammatory process, however neoplasm cannot definitely be excluded.  Both adrenal glands are normal in appearance. No suspicious bone lesions identified.  IMPRESSION: 4 cm right suprahilar mass, highly suspicious for primary bronchogenic carcinoma. Probable postobstructive pneumonitis in the right upper lobe, with neoplasm considered less likely.  Small less than 1 cm right paratracheal mediastinal lymph nodes. No pathologically enlarged mediastinal lymph nodes  identified. PET-CT scan recommended for further evaluation.  18 mm ill-defined opacity in the anterior left lower lobe, which may represent infectious or inflammatory process, although neoplasm cannot definitely be excluded. This can also be further assessed at time of PET-CT, or with follow-up chest CT in 2-3 months.   Electronically Signed   By: Earle Gell M.D.   On: 07/17/2014 12:51   Nm Pet Image Initial (pi) Skull Base To Thigh  07/26/2014   CLINICAL DATA:  Initial treatment strategy for lung nodule.  EXAM: NUCLEAR MEDICINE PET SKULL BASE TO THIGH  TECHNIQUE: 8.5 mCi F-18 FDG was injected intravenously. Full-ring PET imaging was performed from the skull base to thigh after the radiotracer. CT data was obtained and used for attenuation correction and anatomic localization.  FASTING BLOOD GLUCOSE:  Value: 151 mg/dl  COMPARISON:  CT chest 07/17/2014.  FINDINGS: NECK  No hypermetabolic lymph nodes in the neck. CT images show no acute findings.  CHEST  A right supraclavicular lymph node measures 9 mm (CT image 42) with an SUV max of 2.8. Mediastinal lymph nodes measure up to 12 mm in the low right paratracheal station with an SUV max  of 3.8. Right hilar mass was better measured on 07/17/2014 and has an SUV max of 7.2.  When coronal images from diagnostic examination performed 07/17/2014 are reviewed, the right hilar mass appears to come within 1.7 cm of the carina. Right upper lobe bronchus is completely obstructed with marked progression of postobstructive consolidation and pneumonitis in the right upper lobe, which has an SUV max of 9.2 anteriorly. There is narrowing of the bronchus intermedius and right lower lobe bronchus as well, with new postobstructive pneumonitis in the right lower lobe.  CT images show atherosclerotic calcification of the arterial vasculature, including three-vessel involvement of the coronary arteries. Heart size normal. Tiny amount of pericardial fluid may be physiologic. No pleural  fluid. Respiratory motion degrades image quality upon review of the lung windows. There is persistent ground-glass airspace disease in the left lower lobe. Nodular consolidation in the left lower lobe (series 8, image 57) is seen as well.  ABDOMEN/PELVIS  No abnormal hypermetabolism within the liver, adrenal glands, spleen or pancreas. No hypermetabolic lymph nodes.  CT images show the liver to be grossly unremarkable. A large stone fills the fundus of the gallbladder, measuring approximately 3.6 cm. A 1.6 cm low density nodule in the right adrenal gland is stable. Left adrenal gland is unremarkable. Low-attenuation lesions in the kidneys measure up to 1.5 cm on the right and are difficult to definitively characterize without post-contrast imaging. Tiny stones in the right kidney. Spleen, pancreas, stomach and bowel are grossly unremarkable.  Atherosclerotic calcification of the arterial vasculature without abdominal aortic aneurysm. Small bilateral inguinal hernias contain fat. No free fluid.  SKELETON  No abnormal osseous hypermetabolism.  IMPRESSION: 1. Hypermetabolic right hilar mass with hypermetabolic ipsilateral mediastinal and right supraclavicular lymph nodes, most consistent with primary bronchogenic carcinoma. Given apparent distance of less than 2 cm from the carina, findings are worrisome for T3 N3 M0 or stage IIIB disease. 2. Complete obstruction of the right upper lobe bronchus with marked narrowing of the bronchus intermedius and right lower lobe bronchus. Progressive postobstructive pneumonitis in the right upper lobe with new postobstructive changes in the right lower lobe. 3. Persistent ground-glass and nodular airspace disease in the left lower lobe, better imaged on 07/17/2014. 4. Tiny amount of pericardial fluid may be physiologic. 5. Three-vessel coronary artery calcification. 6. Right adrenal adenoma. 7. Tiny right renal stones. 8. Small bilateral inguinal hernias contain fat.    Electronically Signed   By: Lorin Picket M.D.   On: 07/26/2014 09:16   [place] Cm  07/17/2014   CLINICAL DATA:  Persistent cough.  EXAM: CT PARANASAL SINUS WITHOUT CONTRAST  TECHNIQUE: Multidetector CT images of the paranasal sinuses were obtained using the standard protocol without intravenous contrast.  COMPARISON:  None.  FINDINGS: The paranasal sinuses are clear of acute process. Small polyps or mucous retention cysts are noted in the ethmoid sinuses. No air-fluid levels. There is a hypoplastic right maxillary sinus. The mastoid air cells and middle ear cavities are clear. Hearing aids are noted in both ear canals.  The visualized brain is unremarkable. No extra-axial fluid collections. The globes are intact.  IMPRESSION: 1. Small scattered polyps or mucous retention cysts in the ethmoid sinuses. 2. No findings for acute sinusitis. 3. Hypoplastic right maxillary sinus.   Electronically Signed   By: Kalman Jewels M.D.   On: 07/17/2014 12:40    ASSESSMENT: This is a very pleasant 68 years old white male recently diagnosed with stage IIIB (T3, N3, M0)  non-small cell lung cancer presenting with right upper lobe obstructing mass as well as mediastinal and right supraclavicular lymphadenopathy diagnosed in August of 2015. The patient also has suspicious groundglass opacity on the left lung.    PLAN: I had a lengthy discussion with the patient and his fianc today about his current disease is stage, prognosis and treatment options.I gave the patient the option of palliative care and hospice referral versus consideration of a course of systemic chemotherapy as well as palliative radiation to the obstructing right upper lobe lung mass. I will complete the staging workup by ordering MRI of the brain to rule out brain metastasis. The patient is interested in proceeding with systemic treatment and radiation. He could be also considered for concurrent chemoradiation but with the suspicious  lesion on the left lung, I think the patient would benefit from a course of full dose systemic chemotherapy after the palliative radiation. He will initially undergo a course of palliative radiotherapy to the right upper lobe lung mass. He was seen earlier today by Dr. [name] for discussion of this option. This is expected to last around 3 weeks. I would consider the patient for systemic chemotherapy with carboplatin and Abraxane after completion of his palliative radiation. I discussed with the patient adverse effect of this treatment including but not limited to alopecia, myelosuppression, nausea and vomiting, peripheral neuropathy, liver or renal dysfunction. For pain management I started the patient on Norco 5/325 mg by mouth every 6 hours as needed. I would see the patient back for followup visit in 3 weeks for reevaluation and more detailed discussion of his systemic treatment options. For smoke cessation I strongly encouraged the patient to quit smoking and he started working on his smoke cessation since June of 2015. I also recommended for the patient to stay off work at least during the course of his chemotherapy and radiation. He was advised to call immediately if he has any concerning symptoms in the interval. The patient was seen during his multidisciplinary thoracic oncology clinic today by medical oncology, radiation oncology, social worker and physical therapist. The patient voices understanding of current disease status and treatme nt options and is in agreement with the current care plan.  All questions were answered. The patient knows to call the clinic with any problems, questions or concerns. We can certainly see the patient much sooner if necessary.  Thank you so much for allowing me to participate in the care of [place]. I will continue to follow up the patient with you and assist in his care.  I spent 55 minutes counseling the patient face to face. The total time  spent in the appointment was 80 minutes.  Disclaimer: This note was dictated with voice recognition software. Similar sounding words can inadvertently be transcribed and may not be corrected upon review.   Janeice Stegall K. 07/27/2014, 4:06 PM

## 2014-07-27 NOTE — Progress Notes (Signed)
[place] Clinical Social Work  Clinical Social Work met with patient after [facility] manager and briefly discussed Clinical Social Work role and [employer] support programs/services.  Clinical Social Work encouraged patient to call with any additional questions or concerns.  Mr. Cabello currently experiencing many physical symptoms he reports may be attributed to pnuemonia- unable to share information with CSW at this time.   Polo Riley, MSW, LCSW, OSW-C Clinical Social Worker [facility] [phone]

## 2014-07-27 NOTE — Telephone Encounter (Signed)
, °

## 2014-07-27 NOTE — Progress Notes (Signed)
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 male who is seen out of the courtesy of Dr. Christinia Gully for an opinion concerning radiation therapy as part of the multidisciplinary thoracic oncology clinic. Patient presented with a several month history of cough and shortness of breath. More recently this progressed and the patient underwent evaluation..  The chest CT scan showed a 4 cm right suprahilar mass suspicious for malignancy. He was also noted to have postobstructive pneumonitis in the right upper lung region. The patient underwent bronchoscopy and the right upper lobe bronchus was completely obstructed by tumor. In addition approximately 50% of the bronchus intermedius was obstructed. Biopsy of the right upper lobe orifice was performed which revealed squamous cell carcinoma. The patient underwent a PET scan yesterday. It showed the right hilar mass to be hypermetabolic with associated ipsilateral mediastinal and right supraclavicular lymphadenopathy. This was noted to have complete obstruction of the right upper lobe bronchus with marked narrowing of the bronchus intermedius and right lower lobe bronchus. There was noted to have pneumonitis in the right upper lobe as well as postobstructive changes in the right lower lobe which was new compared to most recent chest CT scan. A prescription for Augmentin has been placed in light of the PET scan findings. The patient has not started this medication as of yet.   PREVIOUS RADIATION THERAPY: No  PAST MEDICAL HISTORY:  has a past medical history of DM (diabetes  mellitus); Pneumonia; Hypercholesteremia; and Melanoma.     PAST SURGICAL HISTORY: Past Surgical History  Procedure Laterality Date  . Video bronchoscopy Bilateral 07/20/2014    Procedure: VIDEO BRONCHOSCOPY WITHOUT FLUORO;  Surgeon: Tanda Rockers, MD;  Location: [facility];  Service: Cardiopulmonary;  Laterality: Bilateral;    FAMILY HISTORY: family history includes Cancer in his mother; Heart disease in his mother.  SOCIAL HISTORY:  reports that he quit smoking about 6 weeks ago. His smoking use included Cigarettes and Cigars. He has a 100 pack-year smoking history. His smokeless tobacco use includes Chew. He reports that he does not drink alcohol or use illicit drugs. 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 He is engaged and accompanied by his fianc on evaluation today  ALLERGIES: Review of patient's allergies indicates no known allergies.  MEDICATIONS:  Current Outpatient Prescriptions  Medication Sig Dispense Refill  . amoxicillin-clavulanate (AUGMENTIN) 875-125 MG per tablet Take 1 tablet by mouth 2 (two) times daily.  20 tablet  0  . atorvastatin (LIPITOR) 80 MG tablet Take 80 mg by mouth daily.      . budesonide-formoterol (SYMBICORT) 160-4.5 MCG/ACT inhaler Take 2 puffs first thing in am and then another 2 puffs about 12 hours later.      . chlorpheniramine-HYDROcodone (TUSSIONEX) 10-8 MG/5ML LQCR Take 5 mLs by mouth every 12 (twelve) hours as needed for cough.      . Dapagliflozin Propanediol (FARXIGA) 5 MG TABS Take 5 mg by mouth 2 (two) times daily.      . metFORMIN (GLUCOPHAGE) 500 MG tablet Take 500 mg by mouth 2 (two) times daily with a meal.      .  MILK THISTLE PO Take by mouth daily.      . Pseudoeph-Doxylamine-DM-APAP (NYQUIL PO) Take by mouth as needed.       No current facility-administered medications for this encounter.    REVIEW OF SYSTEMS:  A 15 point review of systems is documented in the electronic medical record. This was  obtained by the nursing staff. However, I reviewed this with the patient to discuss relevant findings and make appropriate changes. He feels poorly today with significant fatigue. Patient is also developed significant coughing and coughing up green sputum. He developed a fever to 101.8 over the weekend. He has sinus pressure/headaches no double vision or blurred vision. He has pain along the right anterior chest area particularly with coughing. He required a wheelchair to be transported into the Department today.   PHYSICAL EXAM:  Vitals - 1 value per visit 8/84/1660  SYSTOLIC 630  DIASTOLIC 68  Pulse 160  Temperature 99.2  Respirations 21  Weight (lb) 168.5  Height 5\' 9"   BMI 24.87  VISIT REPORT    This is a pleasant 68 year old gentleman sitting in Hospital wheelchair who appears ill. He coughs frequently during the evaluation. He responds appropriately to questions. Evaluation of the pupils reveals them to be equal round and reactive to light. The extraocular eye movements are intact. The tongue is midline. No secondary infection noted the oral cavity or posterior pharynx. Patient is edentulous. He has scars along the left upper face and forehead region from prior melanoma surgery. Examination of the neck and supraclavicular region reveals no palpable adenopathy. The axillary areas are free of adenopathy. Examination of the lungs reveals significant upper airway congestion. Patient has wheezing in the right lung field with expiration, mildly decreased breath sounds in the right lung field. The left lung is clear except for upper airway noise. The heart has a regular rhythm with increased rate. The abdomen is soft and nontender with normal bowel sounds. On neurological examination motor strength is 5 out of 5 in the proximal and distal muscle groups of the upper and lower extremities. Peripheral pulses are good. No appreciable edema noted in the extremities.   ECOG = 1    1 - Symptomatic but  completely ambulatory (Restricted in physically strenuous activity but ambulatory and able to carry out work of a light or sedentary nature. For example, light housework, office work)  LABORATORY DATA:  No results found for this basename: WBC, HGB, HCT, MCV, PLT, NEUTROABS   Lab Results  Component Value Date   NA 142 07/17/2014   K 4.4 07/17/2014   CL 108 07/17/2014   CO2 24 07/17/2014   GLUCOSE 128* 07/17/2014   CREATININE 0.9 07/17/2014   CALCIUM 9.2 07/17/2014      RADIOGRAPHY: Dg Chest 2 View  07/12/2014   CLINICAL DATA:  Followup pneumonia, diabetes  EXAM: CHEST  2 VIEW  COMPARISON:  06/27/2014  FINDINGS: Cardiomediastinal silhouette is stable. Persistent ill-defined density in right upper lobe paratracheal. Further correlation with enhanced CT scan of the chest is recommended to exclude mass or adenopathy. Hyperinflation again noted. No segmental infiltrate or pulmonary edema.  IMPRESSION: Persistent ill-defined density in right upper lobe paratracheal suspicious for mass or adenopathy. Further correlation with enhanced CT scan of the chest is again recommended. No segmental infiltrate or pulmonary edema.   Electronically Signed   By: Lahoma Crocker M.D.   On: 07/12/2014 08:20   Ct Chest W Contrast  07/17/2014   CLINICAL DATA:  Recent pneumonia. Persistent abnormal  right lung opacity on chest radiograph.  EXAM: CT CHEST WITH CONTRAST  TECHNIQUE: Multidetector CT imaging of the chest was performed during intravenous contrast administration.  CONTRAST:  55mL OMNIPAQUE IOHEXOL 300 MG/ML  SOLN  COMPARISON:  Chest radiograph on 07/11/2014  FINDINGS: A right suprahilar mass is seen measuring 3.0 x 4.0 cm on image 25, which encompasses the right mainstem bronchus and causes obstruction of the central right upper lobe bronchus. This is suspicious for primary bronchogenic carcinoma.  There is ill-defined opacity in the anterior right upper lobe containing air bronchograms as well as other patchy and nodular areas of  opacity in the right upper lobe. This is likely due to postobstructive pneumonitis, with neoplasm considered less likely.  Small less than 1 cm right paratracheal mediastinal lymph nodes are seen, largest measuring 7 mm on image 20. No pathologically enlarged nodes are seen elsewhere within the thorax. No evidence of pleural or pericardial effusion.  There is also a poorly defined focus of opacity with a somewhat nodular appearance in the anterior left lower lobe on image 36 measuring 18 mm. This may represent an infectious or inflammatory process, however neoplasm cannot definitely be excluded.  Both adrenal glands are normal in appearance. No suspicious bone lesions identified.  IMPRESSION: 4 cm right suprahilar mass, highly suspicious for primary bronchogenic carcinoma. Probable postobstructive pneumonitis in the right upper lobe, with neoplasm considered less likely.  Small less than 1 cm right paratracheal mediastinal lymph nodes. No pathologically enlarged mediastinal lymph nodes identified. PET-CT scan recommended for further evaluation.  18 mm ill-defined opacity in the anterior left lower lobe, which may represent infectious or inflammatory process, although neoplasm cannot definitely be excluded. This can also be further assessed at time of PET-CT, or with follow-up chest CT in 2-3 months.   Electronically Signed   By: Earle Gell M.D.   On: 07/17/2014 12:51   Nm Pet Image Initial (pi) Skull Base To Thigh  07/26/2014   CLINICAL DATA:  Initial treatment strategy for lung nodule.  EXAM: NUCLEAR MEDICINE PET SKULL BASE TO THIGH  TECHNIQUE: 8.5 mCi F-18 FDG was injected intravenously. Full-ring PET imaging was performed from the skull base to thigh after the radiotracer. CT data was obtained and used for attenuation correction and anatomic localization.  FASTING BLOOD GLUCOSE:  Value: 151 mg/dl  COMPARISON:  CT chest 07/17/2014.  FINDINGS: NECK  No hypermetabolic lymph nodes in the neck. CT images show no  acute findings.  CHEST  A right supraclavicular lymph node measures 9 mm (CT image 42) with an SUV max of 2.8. Mediastinal lymph nodes measure up to 12 mm in the low right paratracheal station with an SUV max of 3.8. Right hilar mass was better measured on 07/17/2014 and has an SUV max of 7.2.  When coronal images from diagnostic examination performed 07/17/2014 are reviewed, the right hilar mass appears to come within 1.7 cm of the carina. Right upper lobe bronchus is completely obstructed with marked progression of postobstructive consolidation and pneumonitis in the right upper lobe, which has an SUV max of 9.2 anteriorly. There is narrowing of the bronchus intermedius and right lower lobe bronchus as well, with new postobstructive pneumonitis in the right lower lobe.  CT images show atherosclerotic calcification of the arterial vasculature, including three-vessel involvement of the coronary arteries. Heart size normal. Tiny amount of pericardial fluid may be physiologic. No pleural fluid. Respiratory motion degrades image quality upon review of the lung windows. There is persistent ground-glass airspace  disease in the left lower lobe. Nodular consolidation in the left lower lobe (series 8, image 57) is seen as well.  ABDOMEN/PELVIS  No abnormal hypermetabolism within the liver, adrenal glands, spleen or pancreas. No hypermetabolic lymph nodes.  CT images show the liver to be grossly unremarkable. A large stone fills the fundus of the gallbladder, measuring approximately 3.6 cm. A 1.6 cm low density nodule in the right adrenal gland is stable. Left adrenal gland is unremarkable. Low-attenuation lesions in the kidneys measure up to 1.5 cm on the right and are difficult to definitively characterize without post-contrast imaging. Tiny stones in the right kidney. Spleen, pancreas, stomach and bowel are grossly unremarkable.  Atherosclerotic calcification of the arterial vasculature without abdominal aortic aneurysm.  Small bilateral inguinal hernias contain fat. No free fluid.  SKELETON  No abnormal osseous hypermetabolism.  IMPRESSION: 1. Hypermetabolic right hilar mass with hypermetabolic ipsilateral mediastinal and right supraclavicular lymph nodes, most consistent with primary bronchogenic carcinoma. Given apparent distance of less than 2 cm from the carina, findings are worrisome for T3 N3 M0 or stage IIIB disease. 2. Complete obstruction of the right upper lobe bronchus with marked narrowing of the bronchus intermedius and right lower lobe bronchus. Progressive postobstructive pneumonitis in the right upper lobe with new postobstructive changes in the right lower lobe. 3. Persistent ground-glass and nodular airspace disease in the left lower lobe, better imaged on 07/17/2014. 4. Tiny amount of pericardial fluid may be physiologic. 5. Three-vessel coronary artery calcification. 6. Right adrenal adenoma. 7. Tiny right renal stones. 8. Small bilateral inguinal hernias contain fat.   Electronically Signed   By: Lorin Picket M.D.   On: 07/26/2014 09:16   [place] Cm  07/17/2014   CLINICAL DATA:  Persistent cough.  EXAM: CT PARANASAL SINUS WITHOUT CONTRAST  TECHNIQUE: Multidetector CT images of the paranasal sinuses were obtained using the standard protocol without intravenous contrast.  COMPARISON:  None.  FINDINGS: The paranasal sinuses are clear of acute process. Small polyps or mucous retention cysts are noted in the ethmoid sinuses. No air-fluid levels. There is a hypoplastic right maxillary sinus. The mastoid air cells and middle ear cavities are clear. Hearing aids are noted in both ear canals.  The visualized brain is unremarkable. No extra-axial fluid collections. The globes are intact.  IMPRESSION: 1. Small scattered polyps or mucous retention cysts in the ethmoid sinuses. 2. No findings for acute sinusitis. 3. Hypoplastic right maxillary sinus.   Electronically Signed   By: Kalman Jewels M.D.    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 Patient would be at good candidate for a short course of palliative radiation therapy directed at the right central chest area in light of his bronchial obstruction. At this time the patient is coughing too much and quite symptomatic from his pneumonia to allow for simulation. I will postpone simulation planning until early next week. Patient will tentatively start his radiation therapy in one week on August 20. Anticipate 2-3 weeks of radiation therapy as part of his management.   PLAN:Simulation on August 18. He will also be scheduled for a MRI of the brain to complete his staging workup.  I spent 60 minutes minutes face to face with the patient and more than 50% of that time was spent in counseling and/or coordination of care.   ------------------------------------------------  Blair Promise, PhD, MD

## 2014-07-28 ENCOUNTER — Telehealth: Payer: Self-pay | Admitting: *Deleted

## 2014-07-28 DIAGNOSIS — C3490 Malignant neoplasm of unspecified part of unspecified bronchus or lung: Secondary | ICD-10-CM | POA: Insufficient documentation

## 2014-07-28 NOTE — Telephone Encounter (Signed)
CALLED PATIENT TO INFORM OF TEST FOR 08-04-14, SPOKE WITH PATIENT AND HE IS AWARE OF THIS TEST

## 2014-07-31 ENCOUNTER — Telehealth: Payer: Self-pay | Admitting: Internal Medicine

## 2014-07-31 ENCOUNTER — Ambulatory Visit (INDEPENDENT_AMBULATORY_CARE_PROVIDER_SITE_OTHER)
Admission: RE | Admit: 2014-07-31 | Discharge: 2014-07-31 | Disposition: A | Discharge status: Home / Self Care | Payer: BC Managed Care – PPO | Source: Ambulatory Visit | Attending: Internal Medicine | Admitting: Internal Medicine

## 2014-07-31 ENCOUNTER — Encounter: Payer: Self-pay | Admitting: Internal Medicine

## 2014-07-31 ENCOUNTER — Ambulatory Visit (INDEPENDENT_AMBULATORY_CARE_PROVIDER_SITE_OTHER): Payer: BC Managed Care – PPO | Admitting: Internal Medicine

## 2014-07-31 VITALS — BP 100/62 | HR 100 | Temp 97.1°F | Ht 69.0 in | Wt 171.0 lb

## 2014-07-31 DIAGNOSIS — R918 Other nonspecific abnormal finding of lung field: Secondary | ICD-10-CM

## 2014-07-31 DIAGNOSIS — R222 Localized swelling, mass and lump, trunk: Secondary | ICD-10-CM

## 2014-07-31 DIAGNOSIS — J4489 Other specified chronic obstructive pulmonary disease: Secondary | ICD-10-CM

## 2014-07-31 DIAGNOSIS — J449 Chronic obstructive pulmonary disease, unspecified: Secondary | ICD-10-CM

## 2014-07-31 DIAGNOSIS — J189 Pneumonia, unspecified organism: Secondary | ICD-10-CM

## 2014-07-31 MED ORDER — HYDROCODONE-ACETAMINOPHEN 5-325 MG PO TABS: ORAL_TABLET | ORAL | Status: DC

## 2014-07-31 MED ORDER — METHYLPREDNISOLONE ACETATE 80 MG/ML IJ SUSP
120.0000 mg | Freq: Once | INTRAMUSCULAR | Status: AC
  Administered 2014-07-31: 120 mg via INTRAMUSCULAR

## 2014-07-31 NOTE — Patient Instructions (Addendum)
Depomedrol 120 mg IM today  For cough or pain> take narco up to 2 every 5  hours and call if not effective(do not take tylenol)   Finish augmentin   Call if a medication you are taking is not on this list so we complete  For indigestion or heartburn or stomach pain try maalox plus or pepto bismol  Please remember to go to the x-ray department downstairs for your tests - we will call you with the results when they are available.

## 2014-07-31 NOTE — Assessment & Plan Note (Signed)
-   FOB 07/20/2014 > exophytic mass obst rul/50% obst BI with R lateral tracheal wall involvement>  POS Sq cell ca > PET 07/26/2014 >>> c/w IIIB> MTOC to see 07/27/14> RT to start 08/03/14 >>>  See concerns above > needs RT ASAP

## 2014-07-31 NOTE — Telephone Encounter (Signed)
Called spoke with family medicine. They reports pt is very SOB. appt scheduled to see MW today. Nothing furhter needed

## 2014-07-31 NOTE — Telephone Encounter (Signed)
Called # provided to call and was advised i had the wrong # Called [phone]. Spoke with Dewaine Oats. She was calling to update pt medication list. I have done so. Will forward to MW as an Pharmacist, hospital

## 2014-07-31 NOTE — Assessment & Plan Note (Signed)
?   Onset, confirmed on cxr from [facility] on 06/13/14 involving the entire RUL by report > min residual medially on 06/27/14   without comparisons in system> persistent density on cxr  07/11/2014 > CT chest 07/17/2014 ? obst RUL > FOB 07/20/14 >>> completely obst RUL  He is not going to be able to clear the pna until RT starts this week, in meantime rx with augmentin should be adequate but needs better symptom control with oxycodone up to 2 every 4 hr prn, reviewed

## 2014-07-31 NOTE — Assessment & Plan Note (Signed)
The proper method of use, as well as anticipated side effects, of a metered-dose inhaler are discussed and demonstrated to the patient. Improved effectiveness after extensive coaching during this visit to a level of approximately  90% so should continue symbicort 160 2bid  Depomedrol 120 IM today

## 2014-07-31 NOTE — Progress Notes (Signed)
Subjective:    Patient ID: Daryl Vega, male    DOB: 1946/08/04  MRN: [id number]    Brief patient profile:  52 yowm quit smoking 06/12/14 referred by [facility] 06/27/2014 for eval of ? Recurrent pna   06/27/2014 1st [facility] office visit/ [facility]  Chief Complaint  Patient presents with  . Pulmonary Consult    Referred per Dr. [name].  Pt states that he has had PNA x 2 recently. He c/o cough and SOB for the past 2 months.  Cough is now non prod.   all started 8-10  weeks ago prior to that  No limitation then fiancee got pna then pt got sick rx levaquin for ? pna but still still tired cough /congestion with nasty mucus then seen again with flare cough/fever rx 06/13/14 doxy/prednisone Last fever over a week prior to OV   rec Symbicort 160 Take 2 puffs first thing in am and then another 2 puffs about 12 hours later.  Work on inhaler technique     07/11/2014 f/u ov/[facility] re: f/u pna  Chief Complaint  Patient presents with  . Follow-up    Pt states that his SOB and cough have improved some, but not resovled. Cough is non prod. He c/o rhinitis x 3 days.   Main c/o tired,  Some cough/wheezing but that's  Much better and breathing just about back to baseline rec - FOB 07/20/2014 > exophytic mass obst rul/50% obst BI with R lateral tracheal wall involvement>  POS Sq cell ca > PET 07/26/2014 >>> c/w IIIB> MTOC to see 07/27/14> RT to start 08/03/14     07/31/2014 f/u ov/[facility] re: f/u sp bronchoscopy/ RT/Onc eval  Chief Complaint  Patient presents with  . Acute Visit    Increased SOB, prod cough with green mucus, temp up to 101.8 x 4 days ago, and right side sharp chest pain.   started on augmentin pm 8/14, main c/o harsh cough and pain on R side with coughing and hasn't started any oxycodone yet "they told me not to use it"  -  Thoroughly confused with instructions from different sources, eg still not taking glucophage as was instructed to hold it am of fob)  Comfortable  at rest if not coughing   Fasting am labs today at Dr Teofilo Pod office    No obvious day to day or daytime variabilty or assoc   chest tightness, subjective wheeze overt sinus or hb symptoms. No unusual exp hx or h/o childhood pna/ asthma or knowledge of premature birth.  Sleeping ok without nocturnal  or early am exacerbation  of respiratory  c/o's or need for noct saba. Also denies any obvious fluctuation of symptoms with weather or environmental changes or other aggravating or alleviating factors except as outlined above   Current Medications, Allergies, Complete Past Medical History, Past Surgical History, Family History, and Social History were reviewed in [employer] record.  ROS  The following are not active complaints unless bolded sore throat, dysphagia, dental problems, itching, sneezing,  nasal congestion or excess/ purulent secretions, ear ache,   fever, chills, sweats, unintended wt loss,  exertional cp, hemoptysis,  orthopnea pnd or leg swelling, presyncope, palpitations, heartburn, abdominal pain, anorexia, nausea, vomiting, diarrhea  or change in bowel or urinary habits, change in stools or urine, dysuria,hematuria,  rash, arthralgias, visual complaints, headache, numbness weakness or ataxia or problems with walking or coordination,  change in mood/affect or memory.  Objective:   Physical Exam  amb wm nad in wheelchair   07/31/2014  Wt Readings from Last 3 Encounters:  07/11/14 175 lb (79.379 kg)  06/27/14 173 lb (78.472 kg)         HEENT mild turbinate edema.  Edentulous Oropharynx no thrush or excess pnd or cobblestoning.  No JVD or cervical adenopathy. Mild accessory muscle hypertrophy. Trachea midline, nl thryroid. Chest was hyperinflated by percussion with diminished breath sounds and moderate increased exp time with insp/exp rhonchi R > L anteriorly. Hoover sign positive at mid inspiration. Regular rate and rhythm without  murmur gallop or rub or increase P2 or edema.  Abd: no hsm, nl excursion. Ext warm without cyanosis or clubbing.      CXR  07/31/2014 :  1. Dense consolidation in the right upper lobe reflects post  obstruction change, likely postobstructive pneumonitis. Secondary  pneumonia is possible. The extent of the consolidation is similar to  the recent PET-CT.  2. No other areas of lung consolidation. No pulmonary edema. Mild  basilar subsegmental atelectasis. No pleural effusion.       Assessment & Plan:

## 2014-08-01 ENCOUNTER — Ambulatory Visit (HOSPITAL_COMMUNITY)
Admission: RE | Admit: 2014-08-01 | Discharge: 2014-08-01 | Disposition: A | Discharge status: Home / Self Care | Payer: BC Managed Care – PPO | Source: Ambulatory Visit | Attending: Radiation Oncology | Admitting: Radiation Oncology

## 2014-08-01 ENCOUNTER — Ambulatory Visit
Admission: RE | Admit: 2014-08-01 | Discharge: 2014-08-01 | Disposition: A | Discharge status: Home / Self Care | Payer: BC Managed Care – PPO | Source: Ambulatory Visit | Attending: Radiation Oncology | Admitting: Radiation Oncology

## 2014-08-01 ENCOUNTER — Other Ambulatory Visit: Payer: Self-pay | Admitting: Internal Medicine

## 2014-08-01 ENCOUNTER — Ambulatory Visit (HOSPITAL_COMMUNITY)
Admission: RE | Admit: 2014-08-01 | Discharge: 2014-08-01 | Disposition: A | Discharge status: Home / Self Care | Payer: BC Managed Care – PPO | Source: Ambulatory Visit | Attending: Internal Medicine | Admitting: Internal Medicine

## 2014-08-01 ENCOUNTER — Ambulatory Visit (HOSPITAL_COMMUNITY): Payer: BC Managed Care – PPO

## 2014-08-01 VITALS — BP 123/76 | HR 91 | Temp 98.1°F | Ht 69.0 in | Wt 168.0 lb

## 2014-08-01 DIAGNOSIS — Z9889 Other specified postprocedural states: Secondary | ICD-10-CM | POA: Insufficient documentation

## 2014-08-01 DIAGNOSIS — R059 Cough, unspecified: Secondary | ICD-10-CM | POA: Diagnosis not present

## 2014-08-01 DIAGNOSIS — Z85820 Personal history of malignant melanoma of skin: Secondary | ICD-10-CM | POA: Diagnosis not present

## 2014-08-01 DIAGNOSIS — F411 Generalized anxiety disorder: Secondary | ICD-10-CM | POA: Insufficient documentation

## 2014-08-01 DIAGNOSIS — R109 Unspecified abdominal pain: Secondary | ICD-10-CM | POA: Diagnosis not present

## 2014-08-01 DIAGNOSIS — C3410 Malignant neoplasm of upper lobe, unspecified bronchus or lung: Secondary | ICD-10-CM | POA: Diagnosis not present

## 2014-08-01 DIAGNOSIS — R5383 Other fatigue: Secondary | ICD-10-CM

## 2014-08-01 DIAGNOSIS — M25519 Pain in unspecified shoulder: Secondary | ICD-10-CM | POA: Insufficient documentation

## 2014-08-01 DIAGNOSIS — Z1389 Encounter for screening for other disorder: Secondary | ICD-10-CM | POA: Insufficient documentation

## 2014-08-01 DIAGNOSIS — R918 Other nonspecific abnormal finding of lung field: Secondary | ICD-10-CM

## 2014-08-01 DIAGNOSIS — E7800 Pure hypercholesterolemia, unspecified: Secondary | ICD-10-CM | POA: Insufficient documentation

## 2014-08-01 DIAGNOSIS — C3411 Malignant neoplasm of upper lobe, right bronchus or lung: Secondary | ICD-10-CM

## 2014-08-01 DIAGNOSIS — Z510 Encounter for antineoplastic radiation therapy: Secondary | ICD-10-CM | POA: Insufficient documentation

## 2014-08-01 DIAGNOSIS — R5381 Other malaise: Secondary | ICD-10-CM | POA: Insufficient documentation

## 2014-08-01 DIAGNOSIS — R599 Enlarged lymph nodes, unspecified: Secondary | ICD-10-CM | POA: Diagnosis not present

## 2014-08-01 DIAGNOSIS — E119 Type 2 diabetes mellitus without complications: Secondary | ICD-10-CM | POA: Insufficient documentation

## 2014-08-01 DIAGNOSIS — R05 Cough: Secondary | ICD-10-CM | POA: Diagnosis not present

## 2014-08-01 MED ORDER — GADOBENATE DIMEGLUMINE 529 MG/ML IV SOLN
15.0000 mL | Freq: Once | INTRAVENOUS | Status: AC | PRN
  Administered 2014-08-01: 15 mL via INTRAVENOUS

## 2014-08-01 MED ORDER — PROCHLORPERAZINE MALEATE 10 MG PO TABS: 10.0000 mg | ORAL_TABLET | Freq: Four times a day (QID) | ORAL | Status: DC | PRN

## 2014-08-01 NOTE — Progress Notes (Signed)
Daryl Vega is here with his daughter for [facility] nurse evaluation.  He reports having a cough and also has stomach pain with nausea.  Dr. [name] was notified.

## 2014-08-01 NOTE — Progress Notes (Signed)
  Radiation Oncology         (336) [phone] ________________________________  Name: Daryl Vega MRN: [id number]  Date: 08/01/2014  DOB: [birth year]  SIMULATION AND TREATMENT PLANNING NOTE  DIAGNOSIS:  Lung cancer   Primary site: Lung (Right)   Staging method: AJCC 7th Edition   Clinical: Stage IIIB (T3, N3, M0)    Summary: Stage IIIB (T3, N3, M0)   NARRATIVE:  The patient was brought to the [facility].  Identity was confirmed.  All relevant records and images related to the planned course of therapy were reviewed.  The patient freely provided informed written consent to proceed with treatment after reviewing the details related to the planned course of therapy. The consent form was witnessed and verified by the simulation staff.  Then, the patient was set-up in a stable reproducible  supine position for radiation therapy.  CT images were obtained.  Surface markings were placed.  The CT images were loaded into the planning software.  Then the target and avoidance structures were contoured.  Treatment planning then occurred.  The radiation prescription was entered and confirmed.  Then, I designed and supervised the construction of a total of 3 medically necessary complex treatment devices.  I have requested : 3D Simulation  I have requested a DVH of the following structures: Heart, lungs, GTV, PTV.  I have ordered:dose calc.  PLAN:  The patient will receive 35 Gy in 14 fractions.  ________________________________  -----------------------------------  Blair Promise, PhD, MD

## 2014-08-02 ENCOUNTER — Telehealth: Payer: Self-pay | Admitting: Oncology

## 2014-08-02 DIAGNOSIS — Z510 Encounter for antineoplastic radiation therapy: Secondary | ICD-10-CM | POA: Diagnosis not present

## 2014-08-02 NOTE — Telephone Encounter (Signed)
Called [place] and let him know the good results per Dr. [name] on his MRI from yesterday.  [place] verbalized understanding.  He said he is feeling better today with less nausea.

## 2014-08-03 ENCOUNTER — Ambulatory Visit
Admission: RE | Admit: 2014-08-03 | Discharge: 2014-08-03 | Disposition: A | Discharge status: Home / Self Care | Payer: BC Managed Care – PPO | Source: Ambulatory Visit | Attending: Radiation Oncology | Admitting: Radiation Oncology

## 2014-08-03 DIAGNOSIS — Z510 Encounter for antineoplastic radiation therapy: Secondary | ICD-10-CM | POA: Diagnosis not present

## 2014-08-03 DIAGNOSIS — C3411 Malignant neoplasm of upper lobe, right bronchus or lung: Secondary | ICD-10-CM

## 2014-08-03 NOTE — Progress Notes (Signed)
  Radiation Oncology         [phone] ________________________________  Name: Daryl Vega MRN: [id number]  Date: 08/03/2014  DOB: [birth year]  Simulation Verification Note  Status: outpatient  NARRATIVE: The patient was brought to the treatment unit and placed in the planned treatment position. The clinical setup was verified. Then port films were obtained and uploaded to the radiation oncology medical record software.  The treatment beams were carefully compared against the planned radiation fields. The position location and shape of the radiation fields was reviewed. They targeted volume of tissue appears to be appropriately covered by the radiation beams. Organs at risk appear to be excluded as planned.  Based on my personal review, I approved the simulation verification. The patient's treatment will proceed as planned.  -----------------------------------  Blair Promise, PhD, MD

## 2014-08-04 ENCOUNTER — Ambulatory Visit
Admission: RE | Admit: 2014-08-04 | Discharge: 2014-08-04 | Disposition: A | Discharge status: Home / Self Care | Payer: BC Managed Care – PPO | Source: Ambulatory Visit | Attending: Radiation Oncology | Admitting: Radiation Oncology

## 2014-08-04 ENCOUNTER — Ambulatory Visit (HOSPITAL_COMMUNITY): Admission: RE | Admit: 2014-08-04 | Payer: BC Managed Care – PPO | Source: Ambulatory Visit

## 2014-08-04 ENCOUNTER — Telehealth: Payer: Self-pay | Admitting: Oncology

## 2014-08-04 ENCOUNTER — Telehealth: Payer: Self-pay | Admitting: Internal Medicine

## 2014-08-04 ENCOUNTER — Ambulatory Visit (HOSPITAL_COMMUNITY): Payer: BC Managed Care – PPO

## 2014-08-04 DIAGNOSIS — Z510 Encounter for antineoplastic radiation therapy: Secondary | ICD-10-CM | POA: Diagnosis not present

## 2014-08-04 MED ORDER — AMOXICILLIN-POT CLAVULANATE 875-125 MG PO TABS: 1.0000 | ORAL_TABLET | Freq: Two times a day (BID) | ORAL | Status: DC

## 2014-08-04 NOTE — Telephone Encounter (Signed)
Left a message for Dr. Gustavus Bryant office regarding Spencer's request for a refill on his Augmentin.  Mindy from Dr. Gustavus Bryant office called back and will ask Dr. Melvyn Novas.

## 2014-08-04 NOTE — Telephone Encounter (Signed)
Called Daryl Vega to let him know Dr. Gustavus Bryant office had called and were deciding on refilling the Augmentin.  Advised him to call the on-call doctor or go to the ER over the weekend if he felt worse. Daryl Vega verbalized agreement.

## 2014-08-04 NOTE — Progress Notes (Signed)
Daryl Vega is asking for a refill on Augmentin.  He finished it yesterday.  He said that he felt better when taking it.  He is coughing frequently which wakes him up at night gasping for air.  He reports the cough is the same, it is not worse.  He is bringing up white sputum today and yesterday.  It was green before yesterday.  He is audibly wheezing and he says this is not new.  His oxygen saturation is 95% on room air.  Called Dr. Gustavus Bryant office and left a message asking if Dr. Melvyn Novas would like to refill the Augmentin.  Seward Grater that Dr. Gustavus Bryant office has been called and he feels comfortable going home and being called when Dr. Gustavus Bryant office calls back.

## 2014-08-04 NOTE — Telephone Encounter (Signed)
Fine to give another 10 days Augmentin 875 mg take one pill twice daily  X 10 days - take at breakfast and supper with large glass of water.  It would help reduce the usual side effects (diarrhea and yeast infections) if you ate cultured yogurt at lunch.

## 2014-08-04 NOTE — Telephone Encounter (Signed)
Called spoke with Santiago Glad from Dr. Lois Huxley office. She reports pt stopped by their office and told them he needs refill on augment. Dr. [name] is in a procedure so she called [place]. She reports pt is still coughing quit a bit-prod w/ white phlem, audible wheezing. Pt finished the augmentin yesterday and reports he felt better when he was taking this. Please advise MW thanks  No Known Allergies    --call pt when calling back

## 2014-08-04 NOTE — Telephone Encounter (Signed)
Called and spoke with pt and he is aware of augmentin that has been sent to his pharmacy and nothing further is needed.

## 2014-08-07 ENCOUNTER — Ambulatory Visit
Admission: RE | Admit: 2014-08-07 | Discharge: 2014-08-07 | Disposition: A | Discharge status: Home / Self Care | Payer: BC Managed Care – PPO | Source: Ambulatory Visit | Attending: Radiation Oncology | Admitting: Radiation Oncology

## 2014-08-07 DIAGNOSIS — R918 Other nonspecific abnormal finding of lung field: Secondary | ICD-10-CM

## 2014-08-07 DIAGNOSIS — Z510 Encounter for antineoplastic radiation therapy: Secondary | ICD-10-CM | POA: Diagnosis not present

## 2014-08-07 MED ORDER — RADIAPLEXRX EX GEL
Freq: Once | CUTANEOUS | Status: AC
  Administered 2014-08-07: 12:00:00 via TOPICAL

## 2014-08-07 NOTE — Progress Notes (Signed)
Daryl Vega was given the Radiation Therapy and You book and discussed potential side effects/management of fatigue, skin irritation, throat changes and cough.  He was given radiaplex gel and was instructed to apply it to his right chest/right back, twice a day after treatment and at bedtime.  He was educated about under treat day with Dr. [name] on Tuesday's.  He was given a calender and was advised to call with any questions or concerns.

## 2014-08-08 ENCOUNTER — Ambulatory Visit
Admission: RE | Admit: 2014-08-08 | Discharge: 2014-08-08 | Disposition: A | Discharge status: Home / Self Care | Payer: BC Managed Care – PPO | Source: Ambulatory Visit | Attending: Radiation Oncology | Admitting: Radiation Oncology

## 2014-08-08 ENCOUNTER — Encounter: Payer: Self-pay | Admitting: Radiation Oncology

## 2014-08-08 VITALS — BP 107/78 | HR 98 | Temp 97.8°F | Resp 16 | Ht 68.0 in | Wt 164.7 lb

## 2014-08-08 DIAGNOSIS — C3411 Malignant neoplasm of upper lobe, right bronchus or lung: Secondary | ICD-10-CM

## 2014-08-08 DIAGNOSIS — Z510 Encounter for antineoplastic radiation therapy: Secondary | ICD-10-CM | POA: Diagnosis not present

## 2014-08-08 MED ORDER — HYDROCODONE-ACETAMINOPHEN 5-325 MG PO TABS: ORAL_TABLET | ORAL | Status: DC

## 2014-08-08 MED ORDER — PROCHLORPERAZINE MALEATE 10 MG PO TABS: 10.0000 mg | ORAL_TABLET | Freq: Four times a day (QID) | ORAL | Status: DC | PRN

## 2014-08-08 MED ORDER — LORAZEPAM 0.5 MG PO TABS: 0.5000 mg | ORAL_TABLET | Freq: Three times a day (TID) | ORAL | Status: DC

## 2014-08-08 NOTE — Progress Notes (Signed)
Daryl Vega has completed 4/14 fractions to his right chest.  He reports pain in his left lower abdomen that feels like needles.  He notices it more when getting up from a sitting position.  He is rating it at a 5/10 and takes norco twice a day.  He Has lost 4 lbs since 08/01/14.  He reports a poor appetitive.  Orthostatic vitals done: bp sitting 107/78, hr 98 and standing bp 94/67, hr 101.  He reports a productive cough with clear mucus.  He is taking augmentin.  He denies hemoptysis.  He reports shortness of breath.  His oxygen saturation is 92% today.

## 2014-08-08 NOTE — Progress Notes (Signed)
  Radiation Oncology         [phone] ________________________________  Name: Renaldo Gornick MRN: [id number]  Date: 08/08/2014  DOB: 1946/09/25  Weekly Radiation Therapy Management  Lung cancer   Primary site: Lung (Right)   Staging method: AJCC 7th Edition   Clinical: Stage IIIB (T3, N3, M0)    Summary: Stage IIIB (T3, N3, M0)   Current Dose: 10 Gy     Planned Dose:  35 Gy  Narrative . . . . . . . . The patient presents for routine under treatment assessment.                                   The patient continues to have a lot of coughing but no hemoptysis. He also complains of anxiety and wife says he is fidgety.                                 Set-up films were reviewed.                                 The chart was checked. Physical Findings. . .  height is 5\' 8"  (1.727 m) and weight is 164 lb 11.2 oz (74.707 kg). His oral temperature is 97.8 F (36.6 C). His blood pressure is 107/78 and his pulse is 98. His respiration is 16 and oxygen saturation is 92%. . Decreased breath sounds are noted in the right upper lung field area. No wheezing Impression . . . . . . . The patient is tolerating radiation. Plan . . . . . . . . . . . . Continue treatment as planned. I refilled his pain medication as well as Compazine. I've also given him low-dose Ativan in light of his anxiety and agitation. He will obtain additional cough medicine through his pulmonologist.  ________________________________   Blair Promise, PhD, MD

## 2014-08-09 ENCOUNTER — Ambulatory Visit
Admission: RE | Admit: 2014-08-09 | Discharge: 2014-08-09 | Disposition: A | Discharge status: Home / Self Care | Payer: BC Managed Care – PPO | Source: Ambulatory Visit | Attending: Radiation Oncology | Admitting: Radiation Oncology

## 2014-08-09 DIAGNOSIS — Z510 Encounter for antineoplastic radiation therapy: Secondary | ICD-10-CM | POA: Diagnosis not present

## 2014-08-10 ENCOUNTER — Ambulatory Visit
Admission: RE | Admit: 2014-08-10 | Discharge: 2014-08-10 | Disposition: A | Discharge status: Home / Self Care | Payer: BC Managed Care – PPO | Source: Ambulatory Visit | Attending: Radiation Oncology | Admitting: Radiation Oncology

## 2014-08-10 DIAGNOSIS — Z510 Encounter for antineoplastic radiation therapy: Secondary | ICD-10-CM | POA: Diagnosis not present

## 2014-08-11 ENCOUNTER — Ambulatory Visit
Admission: RE | Admit: 2014-08-11 | Discharge: 2014-08-11 | Disposition: A | Discharge status: Home / Self Care | Payer: BC Managed Care – PPO | Source: Ambulatory Visit | Attending: Radiation Oncology | Admitting: Radiation Oncology

## 2014-08-11 DIAGNOSIS — Z510 Encounter for antineoplastic radiation therapy: Secondary | ICD-10-CM | POA: Diagnosis not present

## 2014-08-14 ENCOUNTER — Ambulatory Visit
Admission: RE | Admit: 2014-08-14 | Discharge: 2014-08-14 | Disposition: A | Discharge status: Home / Self Care | Payer: BC Managed Care – PPO | Source: Ambulatory Visit | Attending: Radiation Oncology | Admitting: Radiation Oncology

## 2014-08-14 DIAGNOSIS — Z510 Encounter for antineoplastic radiation therapy: Secondary | ICD-10-CM | POA: Diagnosis not present

## 2014-08-15 ENCOUNTER — Ambulatory Visit
Admission: RE | Admit: 2014-08-15 | Discharge: 2014-08-15 | Disposition: A | Discharge status: Home / Self Care | Payer: BC Managed Care – PPO | Source: Ambulatory Visit | Attending: Radiation Oncology | Admitting: Radiation Oncology

## 2014-08-15 ENCOUNTER — Encounter: Payer: Self-pay | Admitting: Radiation Oncology

## 2014-08-15 ENCOUNTER — Ambulatory Visit: Payer: BC Managed Care – PPO | Admitting: Nutrition

## 2014-08-15 VITALS — BP 118/77 | HR 98 | Temp 97.8°F | Resp 16 | Ht 68.0 in | Wt 166.7 lb

## 2014-08-15 DIAGNOSIS — C3411 Malignant neoplasm of upper lobe, right bronchus or lung: Secondary | ICD-10-CM

## 2014-08-15 DIAGNOSIS — Z510 Encounter for antineoplastic radiation therapy: Secondary | ICD-10-CM | POA: Diagnosis not present

## 2014-08-15 NOTE — Progress Notes (Signed)
68 year old male diagnosed with lung cancer receiving radiation therapy.  Past medical history includes diabetes, pneumonia, hypercholesterolemia, and melanoma.  Medications include Lipitor, vitamin D3, Amaryl, Ativan, Glucophage, Prilosec, and Compazine.  Labs include glucose 151 on August 12.  Height: 68 inches. Weight: 166.7 pounds Usual body weight 200-210 pounds March 2015.  BMI: 25.35.  Patient reports 35-40 pound weight loss secondary to poor appetite and taste alterations.  Patient complains of nausea.  However, he has only vomited several times.  Only takes nausea medication twice a day. He does report fatigue.  Patient has to force himself to eat meals.  Typically eats cereal or sausage for breakfast along with juice and milk.  Patient grazes the rest of the day.  He enjoys donuts and cookies.  He drinks whole milk.  Nutrition Diagnosis: Unintended weight loss related to inadequate oral intake as evidenced by 16% weight loss from usual body weight.  Intervention: Educated patient on increasing calories and protein throughout the day.  Fact sheets were provided. Recommended patient consume oral nutrition supplements twice a day.  Provided samples. Educated patient on strategies for eating with nausea.  Fact sheets provided. Questions were answered.  Teach back method used.  Contact information was given.  Monitoring, Evaluation, Goals: Patient will increase oral intake to minimize further weight loss.  Next visit: Patient will contact me for questions or concerns.  **Disclaimer: This note was dictated with voice recognition software. Similar sounding words can inadvertently be transcribed and this note may contain transcription errors which may not have been corrected upon publication of note.**

## 2014-08-15 NOTE — Progress Notes (Signed)
Daryl Vega has completed 9/14 fractions to his right chest.  He reports pain after treatment in both of his shoulders.  He is taking norco twice a day.  He reports his cough is a little better.  He reports coughing up clear sputum with occasional green sputum.  He continues to take Augmentin and has 2 left.  He reports a sore throat.  He reports that he is not eating very much.  His weight is up 2 lbs today and he will see the dietician.  His skin is red on his right chest and right upper back.  Advised him to start using radiaplex.  He reports fatigue.  He reports his shortness of breath with activity is worse.  His oxygen saturation today was 98% on room air.

## 2014-08-15 NOTE — Progress Notes (Signed)
  Radiation Oncology         [phone] ________________________________  Name: Daryl Vega MRN: [id number]  Date: 08/15/2014  DOB: 1946-07-16  Weekly Radiation Therapy Management  Lung cancer   Primary site: Lung (Right)   Staging method: AJCC 7th Edition   Clinical: Stage IIIB (T3, N3, M0)   Summary: Stage IIIB (T3, N3, M0)    Current Dose: 22.5 Gy     Planned Dose:  35 Gy  Narrative . . . . . . . . The patient presents for routine under treatment assessment.                                   The patient is without complaint he does feel he is breathing a little better at this time. He denies any esophageal problems.                                 Set-up films were reviewed.                                 The chart was checked. Physical Findings. . .  height is 5\' 8"  (1.727 m) and weight is 166 lb 11.2 oz (75.615 kg). His oral temperature is 97.8 F (36.6 C). His blood pressure is 118/77 and his pulse is 98. His respiration is 16 and oxygen saturation is 98%. . The lungs are clear. The heart has regular rhythm and rate.  No wheezing appreciated Impression . . . . . . . The patient is tolerating radiation. Plan . . . . . . . . . . . . Continue treatment as planned. He will continue on Augmentin for the next couple of days.  ________________________________   Blair Promise, PhD, MD

## 2014-08-16 ENCOUNTER — Telehealth: Payer: Self-pay | Admitting: Internal Medicine

## 2014-08-16 ENCOUNTER — Ambulatory Visit
Admission: RE | Admit: 2014-08-16 | Discharge: 2014-08-16 | Disposition: A | Discharge status: Home / Self Care | Payer: BC Managed Care – PPO | Source: Ambulatory Visit | Attending: Radiation Oncology | Admitting: Radiation Oncology

## 2014-08-16 ENCOUNTER — Other Ambulatory Visit (HOSPITAL_BASED_OUTPATIENT_CLINIC_OR_DEPARTMENT_OTHER): Payer: BC Managed Care – PPO

## 2014-08-16 ENCOUNTER — Encounter: Payer: Self-pay | Admitting: Internal Medicine

## 2014-08-16 ENCOUNTER — Ambulatory Visit (HOSPITAL_BASED_OUTPATIENT_CLINIC_OR_DEPARTMENT_OTHER): Payer: BC Managed Care – PPO | Admitting: Internal Medicine

## 2014-08-16 VITALS — BP 129/74 | HR 85 | Temp 97.9°F | Resp 21 | Ht 68.0 in | Wt 165.1 lb

## 2014-08-16 DIAGNOSIS — C3410 Malignant neoplasm of upper lobe, unspecified bronchus or lung: Secondary | ICD-10-CM

## 2014-08-16 DIAGNOSIS — R918 Other nonspecific abnormal finding of lung field: Secondary | ICD-10-CM

## 2014-08-16 DIAGNOSIS — Z510 Encounter for antineoplastic radiation therapy: Secondary | ICD-10-CM | POA: Diagnosis not present

## 2014-08-16 DIAGNOSIS — R109 Unspecified abdominal pain: Secondary | ICD-10-CM

## 2014-08-16 DIAGNOSIS — C3491 Malignant neoplasm of unspecified part of right bronchus or lung: Secondary | ICD-10-CM

## 2014-08-16 LAB — CBC WITH DIFFERENTIAL/PLATELET
BASO%: 1.9 % (ref 0.0–2.0)
Basophils Absolute: 0.1 10*3/uL (ref 0.0–0.1)
EOS ABS: 0.4 10*3/uL (ref 0.0–0.5)
EOS%: 5.8 % (ref 0.0–7.0)
HEMATOCRIT: 40.3 % (ref 38.4–49.9)
HEMOGLOBIN: 12.8 g/dL — AB (ref 13.0–17.1)
LYMPH#: 1 10*3/uL (ref 0.9–3.3)
LYMPH%: 13.6 % — ABNORMAL LOW (ref 14.0–49.0)
MCH: 27.4 pg (ref 27.2–33.4)
MCHC: 31.7 g/dL — ABNORMAL LOW (ref 32.0–36.0)
MCV: 86.4 fL (ref 79.3–98.0)
MONO#: 0.7 10*3/uL (ref 0.1–0.9)
MONO%: 8.6 % (ref 0.0–14.0)
NEUT%: 70.1 % (ref 39.0–75.0)
NEUTROS ABS: 5.3 10*3/uL (ref 1.5–6.5)
PLATELETS: 241 10*3/uL (ref 140–400)
RBC: 4.66 10*6/uL (ref 4.20–5.82)
RDW: 18 % — ABNORMAL HIGH (ref 11.0–14.6)
WBC: 7.6 10*3/uL (ref 4.0–10.3)

## 2014-08-16 LAB — COMPREHENSIVE METABOLIC PANEL (CC13)
ALT: 25 U/L (ref 0–55)
ANION GAP: 10 meq/L (ref 3–11)
AST: 12 U/L (ref 5–34)
Albumin: 2.9 g/dL — ABNORMAL LOW (ref 3.5–5.0)
Alkaline Phosphatase: 118 U/L (ref 40–150)
BUN: 17.4 mg/dL (ref 7.0–26.0)
CALCIUM: 9 mg/dL (ref 8.4–10.4)
CHLORIDE: 104 meq/L (ref 98–109)
CO2: 24 meq/L (ref 22–29)
Creatinine: 0.8 mg/dL (ref 0.7–1.3)
GLUCOSE: 106 mg/dL (ref 70–140)
Potassium: 3.8 mEq/L (ref 3.5–5.1)
Sodium: 138 mEq/L (ref 136–145)
TOTAL PROTEIN: 7 g/dL (ref 6.4–8.3)
Total Bilirubin: 0.41 mg/dL (ref 0.20–1.20)

## 2014-08-16 MED ORDER — OXYCODONE-ACETAMINOPHEN 5-325 MG PO TABS
ORAL_TABLET | ORAL | Status: AC
  Filled 2014-08-16: qty 1

## 2014-08-16 MED ORDER — OXYCODONE-ACETAMINOPHEN 5-325 MG PO TABS
1.0000 | ORAL_TABLET | ORAL | Status: AC
Start: 2014-08-16 — End: 2014-08-16
  Administered 2014-08-16: 1 via ORAL

## 2014-08-16 NOTE — Progress Notes (Signed)
Per Dr Vista Mink, give 1 tablet percocet for 5/10 stomach pain.

## 2014-08-16 NOTE — Progress Notes (Signed)
Called back to the machine because Daryl Vega was acting groggy.  He reports feeling sick to his stomach this morning.  He took metformin, glimepiride, norco, ativan and compazine this morning.  He said that is what he usually takes in the morning.  Advised him not to take the compazine and ativan together.  He said he did eat breakfast and did not throw up.  Checked his blood sugar and it was 72.  He said that this is low for him.  Gave him a coke to drink.  He said he feels OK to have treatment today.  He does not want to see the doctor or go to the ER.  He has an appointment with Dr. Julien Nordmann this afternoon.

## 2014-08-16 NOTE — Progress Notes (Signed)
[facility] [phone]   [phone]) [phone]  OFFICE PROGRESS NOTE  [name], MD [place] [address]  DIAGNOSIS: Stage IIIB (T3, N3, M0) non-small cell lung cancer presenting with right upper lobe obstructing mass as well as mediastinal and right supraclavicular lymphadenopathy diagnosed in August of 2015.  PRIOR THERAPY: None  CURRENT THERAPY: 1) palliative radiotherapy to the obstructing lung mass in the right upper lobe under the care of Dr. [name] expected to be completed on 08/23/2014. 2) systemic chemotherapy with carboplatin for AUC of 5 on day 1 and Abraxane 100 mg/M2 on days 1, 8 and 15 every 3 weeks. First dose 08/30/2014.  INTERVAL HISTORY: Daryl Vega 68 y.o. male returns to the clinic today for followup visit accompanied by his girlfriend. The patient is feeling fine today with no specific complaints except for mild right upper quadrant abdominal pain that started an hour before the visit. He is tolerating his course of palliative radiotherapy fairly well with no significant adverse effects. He continues to have shortness of breath at baseline and increased with exertion. He denied having any significant chest pain. He has no fever or chills, no nausea or vomiting. He had MRI of the brain performed recently. He is here today for evaluation and discussion of his treatment options.  MEDICAL HISTORY: Past Medical History  Diagnosis Date  . DM (diabetes mellitus)   . Pneumonia   . Hypercholesteremia   . Melanoma     ALLERGIES:  has No Known Allergies.  MEDICATIONS:  Current Outpatient Prescriptions  Medication Sig Dispense Refill  . amoxicillin-clavulanate (AUGMENTIN) 875-125 MG per tablet Take 1 tablet by mouth 2 (two) times daily.  20 tablet  0  . atorvastatin (LIPITOR) 80 MG tablet Take 80 mg by mouth daily.      . budesonide-formoterol (SYMBICORT) 160-4.5 MCG/ACT inhaler Take 2  puffs first thing in am and then another 2 puffs about 12 hours later.      . Cholecalciferol (VITAMIN D3 PO) Take 10,000 Units by mouth daily.      . Cinnamon 500 MG capsule Take 500 mg by mouth daily.      . Dapagliflozin Propanediol (FARXIGA) 5 MG TABS Take 5 mg by mouth 2 (two) times daily.      [name] glimepiride (AMARYL) 4 MG tablet Take 4 mg by mouth 2 (two) times daily. Take before meals      . hyaluronate sodium (RADIAPLEXRX) GEL Apply 1 application topically once.      [name] HYDROcodone-acetaminophen (NORCO) 5-325 MG per tablet 1-2 up to every 4 hours for pain or cough  40 tablet  0  . LORazepam (ATIVAN) 0.5 MG tablet Take 1 tablet (0.5 mg total) by mouth every 8 (eight) hours.  30 tablet  0  . metFORMIN (GLUCOPHAGE) 500 MG tablet Take 500 mg by mouth 2 (two) times daily with a meal.      . omeprazole (PRILOSEC) 20 MG capsule Take 20 mg by mouth daily.      . prochlorperazine (COMPAZINE) 10 MG tablet Take 1 tablet (10 mg total) by mouth every 6 (six) hours as needed for nausea or vomiting.  30 tablet  0  . temazepam (RESTORIL) 15 MG capsule Take 15 mg by mouth at bedtime as needed for sleep.       No current facility-administered medications for this visit.    SURGICAL HISTORY:  Past Surgical History  Procedure Laterality  Date  . Video bronchoscopy Bilateral 07/20/2014    Procedure: VIDEO BRONCHOSCOPY WITHOUT FLUORO;  Surgeon: Tanda Rockers, MD;  Location: [facility];  Service: Cardiopulmonary;  Laterality: Bilateral;    REVIEW OF SYSTEMS:  Constitutional: positive for fatigue Eyes: negative Ears, nose, mouth, throat, and face: negative Respiratory: positive for cough and dyspnea on exertion Cardiovascular: negative Gastrointestinal: negative Genitourinary:negative Integument/breast: negative Hematologic/lymphatic: negative Musculoskeletal:negative Neurological: negative Behavioral/Psych: negative Endocrine: negative Allergic/Immunologic: negative   PHYSICAL EXAMINATION:  General appearance: alert, cooperative, fatigued and no distress Head: Normocephalic, without obvious abnormality, atraumatic Neck: no adenopathy, no JVD, supple, symmetrical, trachea midline and thyroid not enlarged, symmetric, no tenderness/mass/nodules Lymph nodes: Cervical, supraclavicular, and axillary nodes normal. Resp: rales RML and RUL and wheezes RML and RUL Back: symmetric, no curvature. ROM normal. No CVA tenderness. Cardio: regular rate and rhythm, S1, S2 normal, no murmur, click, rub or gallop GI: soft, non-tender; bowel sounds normal; no masses,  no organomegaly Extremities: extremities normal, atraumatic, no cyanosis or edema Neurologic: Alert and oriented X 3, normal strength and tone. Normal symmetric reflexes. Normal coordination and gait  ECOG PERFORMANCE STATUS: 1 - Symptomatic but completely ambulatory  Blood pressure 129/74, pulse 85, temperature 97.9 F (36.6 C), temperature source Oral, resp. rate 21, height 5\' 8"  (1.727 m), weight 165 lb 1.6 oz (74.889 kg), SpO2 97.00%.  LABORATORY DATA: Lab Results  Component Value Date   WBC 7.6 08/16/2014   HGB 12.8* 08/16/2014   HCT 40.3 08/16/2014   MCV 86.4 08/16/2014   PLT 241 08/16/2014      Chemistry      Component Value Date/Time   NA 142 07/17/2014 0904   K 4.4 07/17/2014 0904   CL 108 07/17/2014 0904   CO2 24 07/17/2014 0904   BUN 12 07/17/2014 0904   CREATININE 0.9 07/17/2014 0904      Component Value Date/Time   CALCIUM 9.2 07/17/2014 0904       RADIOGRAPHIC STUDIES: Dg Eye Foreign Body  08/01/2014   CLINICAL DATA:  Implanted hearing aids.  Pre MRI evaluation.  EXAM: ORBITS FOR FOREIGN BODY - 2 VIEW  COMPARISON:  Sinus CT 07/17/2014  FINDINGS: Evidence for bilateral electronic hearing aid devices. No radiopaque foreign bodies or metallic structures in the region of the orbits. Small metallic structures associated with the maxilla.  IMPRESSION: No evidence of metallic foreign body within the orbits.  Bilateral hearing aid  devices.   Electronically Signed   By: Markus Daft M.D.   On: 08/01/2014 11:59   Dg Chest 2 View  07/31/2014   CLINICAL DATA:  Cough.  Short of breath.  Fever.  EXAM: CHEST  2 VIEW  COMPARISON:  PET-CT, 07/26/2014.  Chest radiograph, 07/11/2014.  FINDINGS: Dense consolidation in the right upper lobe reflects postobstructive pneumonitis. Lobe could be secondarily infected. Mild reticular lung base opacity, mostly on the left, is likely subsegmental atelectasis. No other areas of lung consolidation. No pleural effusion.  Known right hilar mass is obscured by the contiguous right upper lobe consolidation. No left hilar mass. Mild thickening along the right peritracheal stripe. Cardiac silhouette is normal in size.  Bony thorax is intact.  IMPRESSION: 1. Dense consolidation in the right upper lobe reflects post obstruction change, likely postobstructive pneumonitis. Secondary pneumonia is possible. The extent of the consolidation is similar to the recent PET-CT. 2. No other areas of lung consolidation. No pulmonary edema. Mild basilar subsegmental atelectasis. No pleural effusion.   Electronically Signed   By: Lajean Manes  M.D.   On: 07/31/2014 13:09   Mr Kizzie Fantasia Contrast  08/01/2014   CLINICAL DATA:  68 year old male with confusion and recent diagnosis of lung cancer. Staging. Subsequent encounter.  EXAM: MRI HEAD WITHOUT AND WITH CONTRAST  TECHNIQUE: Multiplanar, multiecho pulse sequences of the brain and surrounding structures were obtained without and with intravenous contrast.  CONTRAST:  29mL MULTIHANCE GADOBENATE DIMEGLUMINE 529 MG/ML IV SOLN  COMPARISON:  Paranasal sinus CT 07/17/2014.  FINDINGS: No abnormal enhancement identified. No midline shift, mass effect, or evidence of intracranial mass lesion.  Susceptibility artifact related to dental hardware, mildly degrades some portions of this exam. Cerebral volume is within normal limits for age. No restricted diffusion to suggest acute infarction. No  ventriculomegaly, extra-axial collection or acute intracranial hemorrhage. Cervicomedullary junction and pituitary are within normal limits. Major intracranial vascular flow voids are preserved, dominant distal left vertebral artery. Pearline Cables and white matter signal is within normal limits for age throughout the brain.  Degenerative disc and endplate degeneration at C3-C4 resulting in cervical spinal stenosis, appears to be mild.  Visualized bone marrow signal is within normal limits. Visible internal auditory structures appear normal. Trace inferior right mastoid effusion. Negative visible paranasal sinuses. Postoperative changes to the globes. Visualized scalp soft tissues are within normal limits.  IMPRESSION: No acute or metastatic intracranial abnormality.   Electronically Signed   By: Lars Pinks M.D.   On: 08/01/2014 20:29   Nm Pet Image Initial (pi) Skull Base To Thigh  07/26/2014   CLINICAL DATA:  Initial treatment strategy for lung nodule.  EXAM: NUCLEAR MEDICINE PET SKULL BASE TO THIGH  TECHNIQUE: 8.5 mCi F-18 FDG was injected intravenously. Full-ring PET imaging was performed from the skull base to thigh after the radiotracer. CT data was obtained and used for attenuation correction and anatomic localization.  FASTING BLOOD GLUCOSE:  Value: 151 mg/dl  COMPARISON:  CT chest 07/17/2014.  FINDINGS: NECK  No hypermetabolic lymph nodes in the neck. CT images show no acute findings.  CHEST  A right supraclavicular lymph node measures 9 mm (CT image 42) with an SUV max of 2.8. Mediastinal lymph nodes measure up to 12 mm in the low right paratracheal station with an SUV max of 3.8. Right hilar mass was better measured on 07/17/2014 and has an SUV max of 7.2.  When coronal images from diagnostic examination performed 07/17/2014 are reviewed, the right hilar mass appears to come within 1.7 cm of the carina. Right upper lobe bronchus is completely obstructed with marked progression of postobstructive consolidation and  pneumonitis in the right upper lobe, which has an SUV max of 9.2 anteriorly. There is narrowing of the bronchus intermedius and right lower lobe bronchus as well, with new postobstructive pneumonitis in the right lower lobe.  CT images show atherosclerotic calcification of the arterial vasculature, including three-vessel involvement of the coronary arteries. Heart size normal. Tiny amount of pericardial fluid may be physiologic. No pleural fluid. Respiratory motion degrades image quality upon review of the lung windows. There is persistent ground-glass airspace disease in the left lower lobe. Nodular consolidation in the left lower lobe (series 8, image 57) is seen as well.  ABDOMEN/PELVIS  No abnormal hypermetabolism within the liver, adrenal glands, spleen or pancreas. No hypermetabolic lymph nodes.  CT images show the liver to be grossly unremarkable. A large stone fills the fundus of the gallbladder, measuring approximately 3.6 cm. A 1.6 cm low density nodule in the right adrenal gland is stable. Left adrenal gland  is unremarkable. Low-attenuation lesions in the kidneys measure up to 1.5 cm on the right and are difficult to definitively characterize without post-contrast imaging. Tiny stones in the right kidney. Spleen, pancreas, stomach and bowel are grossly unremarkable.  Atherosclerotic calcification of the arterial vasculature without abdominal aortic aneurysm. Small bilateral inguinal hernias contain fat. No free fluid.  SKELETON  No abnormal osseous hypermetabolism.  IMPRESSION: 1. Hypermetabolic right hilar mass with hypermetabolic ipsilateral mediastinal and right supraclavicular lymph nodes, most consistent with primary bronchogenic carcinoma. Given apparent distance of less than 2 cm from the carina, findings are worrisome for T3 N3 M0 or stage IIIB disease. 2. Complete obstruction of the right upper lobe bronchus with marked narrowing of the bronchus intermedius and right lower lobe bronchus.  Progressive postobstructive pneumonitis in the right upper lobe with new postobstructive changes in the right lower lobe. 3. Persistent ground-glass and nodular airspace disease in the left lower lobe, better imaged on 07/17/2014. 4. Tiny amount of pericardial fluid may be physiologic. 5. Three-vessel coronary artery calcification. 6. Right adrenal adenoma. 7. Tiny right renal stones. 8. Small bilateral inguinal hernias contain fat.   Electronically Signed   By: Lorin Picket M.D.   On: 07/26/2014 09:16    ASSESSMENT AND PLAN: This is a very pleasant 68 years old white male recently diagnosed with a stage IIIB non-small cell lung cancer currently undergoing a short course of palliative radiotherapy to the obstructing mass in the right upper lobe which is expected to be completed next week. I have a lengthy discussion with the patient and his girlfriend today about his current disease status and treatment options. I recommended for the patient systemic chemotherapy with carboplatin for AUC of 5 on day 1 and Abraxane 100 mg/M2 on days 1, 8 and 15 every 3 weeks. He was also given him the option of palliative care and hospice referral. The patient is interested in proceeding with systemic chemotherapy and he gives a verbal consent for the treatment. I reminded him with the adverse effect of this treatment including but not limited to alopecia, myelosuppression, nausea and vomiting, peripheral neuropathy, liver or renal dysfunction. I will arrange for the patient to have a chemotherapy education class before starting the first cycle of his treatment on 08/30/2014. I will call his pharmacy with prescription for Compazine 10 mg by mouth every 6 hours as needed for nausea. He would come back for followup visit in 3 weeks for evaluation and management any adverse effect of his treatment. For pain management, the patient was given Percocet 5/325 mg one tablet daily for the abdominal pain. He was advised to call  immediately if he has any concerning symptoms in the interval. The patient voices understanding of current disease status and treatment options and is in agreement with the current care plan.  All questions were answered. The patient knows to call the clinic with any problems, questions or concerns. We can certainly see the patient much sooner if necessary.  I spent 20 minutes counseling the patient face to face. The total time spent in the appointment was 30 minutes.  Disclaimer: This note was dictated with voice recognition software. Similar sounding words can inadvertently be transcribed and may not be corrected upon review.

## 2014-08-16 NOTE — Telephone Encounter (Signed)
Pt confirmed labs/ov per 09/02 POF, sent msg to add chemo, gave pt AVS....KJ

## 2014-08-17 ENCOUNTER — Ambulatory Visit
Admission: RE | Admit: 2014-08-17 | Discharge: 2014-08-17 | Disposition: A | Discharge status: Home / Self Care | Payer: BC Managed Care – PPO | Source: Ambulatory Visit | Attending: Radiation Oncology | Admitting: Radiation Oncology

## 2014-08-17 ENCOUNTER — Telehealth: Payer: Self-pay | Admitting: *Deleted

## 2014-08-17 ENCOUNTER — Encounter: Payer: Self-pay | Admitting: Radiation Oncology

## 2014-08-17 VITALS — BP 112/76 | HR 102 | Temp 98.3°F | Resp 20 | Ht 68.0 in | Wt 165.3 lb

## 2014-08-17 DIAGNOSIS — Z510 Encounter for antineoplastic radiation therapy: Secondary | ICD-10-CM | POA: Diagnosis not present

## 2014-08-17 DIAGNOSIS — C3411 Malignant neoplasm of upper lobe, right bronchus or lung: Secondary | ICD-10-CM

## 2014-08-17 MED ORDER — LORAZEPAM 0.5 MG PO TABS: 0.5000 mg | ORAL_TABLET | Freq: Three times a day (TID) | ORAL | Status: DC

## 2014-08-17 MED ORDER — PROCHLORPERAZINE MALEATE 10 MG PO TABS: 10.0000 mg | ORAL_TABLET | Freq: Four times a day (QID) | ORAL | Status: DC | PRN

## 2014-08-17 MED ORDER — LEVOFLOXACIN 750 MG PO TABS: 750.0000 mg | ORAL_TABLET | Freq: Every day | ORAL | Status: DC

## 2014-08-17 NOTE — Telephone Encounter (Signed)
Per staff message and POF I have scheduled appts. Advised scheduler of appts. Advised scheduler to move lab due the first treatment needs to start before 12pm.  JMW

## 2014-08-17 NOTE — Progress Notes (Signed)
Daryl Vega has completed 11/14 fractions to his right chest.  He reports pain in his right upper abdomen at a 6/10.  He was seen by Dr. Julien Nordmann yesterday and was told it may be his gallbladder.  He denies an increase in coughing and shortness of breath.  He finished Augmentin yesterday.  He reports he feels weak and ran into a door this morning.  He has a cut over his left eyebrow.  He denies a headache and dizziness.

## 2014-08-17 NOTE — Progress Notes (Signed)
  Radiation Oncology         [phone] ________________________________  Name: Daryl Vega MRN: [id number]  Date: 08/17/2014  DOB: 06-Mar-1946  Weekly Radiation Therapy Management  Lung cancer   Primary site: Lung (Right)   Staging method: AJCC 7th Edition   Clinical: Stage IIIB (T3, N3, M0)    Summary: Stage IIIB (T3, N3, M0)   Current Dose: 27.5 Gy     Planned Dose:  35 Gy  Narrative . . . . . . . . The patient presents for routine under treatment assessment.                                   The patient asked to be seen again this week. He is overall feeling poorly. He denies any obvious chills or fever. He has finished his Augmentin prescription. Since completion of this prescription he feels he has gotten worse. He complains of abdominal pain but no significant nausea constipation or diarrhea. On treatment planning CT scan today the patient's right upper lung is collapsed or shows persistent significant pneumonia.                                 Set-up films were reviewed.                                 The chart was checked. Physical Findings. . .  height is 5\' 8"  (1.727 m) and weight is 165 lb 4.8 oz (74.98 kg). His oral temperature is 98.3 F (36.8 C). His blood pressure is 112/76 and his pulse is 102. His respiration is 20 and oxygen saturation is 96%. . The lungs are clear. The heart has a regular rhythm and rate. The abdomen is soft and nontender with with normal bowel sounds. No rebound or guarding Impression . . . . . . . The patient is tolerating radiation. Plan . . . . . . . . . . . . Continue treatment as planned. Patient had refill on his Ativan and Compazine. I will place him on Levaquin for  persistent pneumonia.  ________________________________   Blair Promise, PhD, MD

## 2014-08-18 ENCOUNTER — Ambulatory Visit
Admission: RE | Admit: 2014-08-18 | Discharge: 2014-08-18 | Disposition: A | Discharge status: Home / Self Care | Payer: BC Managed Care – PPO | Source: Ambulatory Visit | Attending: Radiation Oncology | Admitting: Radiation Oncology

## 2014-08-18 DIAGNOSIS — Z510 Encounter for antineoplastic radiation therapy: Secondary | ICD-10-CM | POA: Diagnosis not present

## 2014-08-22 ENCOUNTER — Ambulatory Visit
Admission: RE | Admit: 2014-08-22 | Discharge: 2014-08-22 | Disposition: A | Discharge status: Home / Self Care | Payer: BC Managed Care – PPO | Source: Ambulatory Visit | Attending: Radiation Oncology | Admitting: Radiation Oncology

## 2014-08-22 DIAGNOSIS — Z510 Encounter for antineoplastic radiation therapy: Secondary | ICD-10-CM | POA: Diagnosis not present

## 2014-08-23 ENCOUNTER — Encounter: Payer: Self-pay | Admitting: Radiation Oncology

## 2014-08-23 ENCOUNTER — Ambulatory Visit
Admission: RE | Admit: 2014-08-23 | Discharge: 2014-08-23 | Disposition: A | Discharge status: Home / Self Care | Payer: BC Managed Care – PPO | Source: Ambulatory Visit | Attending: Radiation Oncology | Admitting: Radiation Oncology

## 2014-08-23 VITALS — BP 106/72 | HR 108 | Temp 97.7°F | Resp 20 | Wt 165.0 lb

## 2014-08-23 DIAGNOSIS — C3411 Malignant neoplasm of upper lobe, right bronchus or lung: Secondary | ICD-10-CM

## 2014-08-23 DIAGNOSIS — Z510 Encounter for antineoplastic radiation therapy: Secondary | ICD-10-CM | POA: Diagnosis not present

## 2014-08-23 MED ORDER — LORAZEPAM 0.5 MG PO TABS
0.5000 mg | ORAL_TABLET | Freq: Three times a day (TID) | ORAL | Status: DC
Start: 2014-08-23 — End: 2014-09-18

## 2014-08-23 MED ORDER — PROCHLORPERAZINE MALEATE 10 MG PO TABS: 10.0000 mg | ORAL_TABLET | Freq: Four times a day (QID) | ORAL | Status: DC | PRN

## 2014-08-23 MED ORDER — HYDROCODONE-ACETAMINOPHEN 5-325 MG PO TABS: ORAL_TABLET | ORAL | Status: DC

## 2014-08-23 NOTE — Progress Notes (Signed)
  Radiation Oncology         [phone] ________________________________  Name: Daryl Vega MRN: [id number]  Date: 08/23/2014  DOB: [birth year]  Weekly Radiation Therapy Management  Current Dose: 35 Gy     Planned Dose:  35 Gy  Narrative . . . . . . . . The patient presents for routine under treatment assessment.                                   The patient is is happy to complete his radiation therapy today. He denies any swallowing problems. He continues to have dyspnea on exertion. He denies any hemoptysis or significant cough at this time.                                 Set-up films were reviewed.                                 The chart was checked. Physical Findings. . .  weight is 165 lb (74.844 kg). His oral temperature is 97.7 F (36.5 C). His blood pressure is 106/72 and his pulse is 108. His respiration is 20 and oxygen saturation is 96%. . The lungs are clear. The heart has a regular rhythm and rate.. Impression . . . . . . . The patient is tolerating radiation. Plan . . . . . . . . . . . [name] routine followup in one month. Patient will proceed with chemotherapy in mid  September.  ________________________________   Blair Promise, PhD, MD

## 2014-08-23 NOTE — Progress Notes (Addendum)
Weekly rad tx rt chest,14/14 completed ,  occasional cough stated coughs up whitish phelgm, c/o abdomen pain right lower area, stated he only slept 2 hours last night, pain 3-4 on 10 scale, appetite still good, yesterday ate sausage dog, 1/2 chicken sandwich, yogurt, ice cream, cantelope, hamburger,  No c/o nausea Ate sausage dog, cereal, bites of cantelope,drinking water, l/2 cheese sandwich today, says he feels better now than yeterday, room air sats 96%  Sats, fatigued, has f/u appt for Oct 12,2015 @ 4:40pm, has completed levaquin and Augmentin   1:28 PM

## 2014-08-24 ENCOUNTER — Other Ambulatory Visit: Payer: BC Managed Care – PPO

## 2014-08-30 ENCOUNTER — Other Ambulatory Visit (HOSPITAL_BASED_OUTPATIENT_CLINIC_OR_DEPARTMENT_OTHER): Payer: BC Managed Care – PPO

## 2014-08-30 ENCOUNTER — Ambulatory Visit (HOSPITAL_BASED_OUTPATIENT_CLINIC_OR_DEPARTMENT_OTHER): Payer: BC Managed Care – PPO

## 2014-08-30 ENCOUNTER — Other Ambulatory Visit: Payer: Self-pay | Admitting: Internal Medicine

## 2014-08-30 VITALS — BP 111/75 | HR 100 | Temp 98.5°F | Resp 18

## 2014-08-30 DIAGNOSIS — Z23 Encounter for immunization: Secondary | ICD-10-CM

## 2014-08-30 DIAGNOSIS — C3491 Malignant neoplasm of unspecified part of right bronchus or lung: Secondary | ICD-10-CM

## 2014-08-30 DIAGNOSIS — C3411 Malignant neoplasm of upper lobe, right bronchus or lung: Secondary | ICD-10-CM

## 2014-08-30 DIAGNOSIS — Z5111 Encounter for antineoplastic chemotherapy: Secondary | ICD-10-CM

## 2014-08-30 DIAGNOSIS — C3410 Malignant neoplasm of upper lobe, unspecified bronchus or lung: Secondary | ICD-10-CM

## 2014-08-30 DIAGNOSIS — C3490 Malignant neoplasm of unspecified part of unspecified bronchus or lung: Secondary | ICD-10-CM

## 2014-08-30 LAB — CBC WITH DIFFERENTIAL/PLATELET
BASO%: 0.5 % (ref 0.0–2.0)
Basophils Absolute: 0.1 10*3/uL (ref 0.0–0.1)
EOS%: 1.9 % (ref 0.0–7.0)
Eosinophils Absolute: 0.2 10*3/uL (ref 0.0–0.5)
HEMATOCRIT: 40.4 % (ref 38.4–49.9)
HGB: 13 g/dL (ref 13.0–17.1)
LYMPH#: 0.6 10*3/uL — AB (ref 0.9–3.3)
LYMPH%: 5.1 % — ABNORMAL LOW (ref 14.0–49.0)
MCH: 27.6 pg (ref 27.2–33.4)
MCHC: 32.2 g/dL (ref 32.0–36.0)
MCV: 85.7 fL (ref 79.3–98.0)
MONO#: 1.1 10*3/uL — AB (ref 0.1–0.9)
MONO%: 9.5 % (ref 0.0–14.0)
NEUT#: 9.8 10*3/uL — ABNORMAL HIGH (ref 1.5–6.5)
NEUT%: 83 % — AB (ref 39.0–75.0)
Platelets: 159 10*3/uL (ref 140–400)
RBC: 4.71 10*6/uL (ref 4.20–5.82)
RDW: 17.9 % — AB (ref 11.0–14.6)
WBC: 11.8 10*3/uL — AB (ref 4.0–10.3)

## 2014-08-30 LAB — COMPREHENSIVE METABOLIC PANEL (CC13)
ALBUMIN: 3.2 g/dL — AB (ref 3.5–5.0)
ALT: 15 U/L (ref 0–55)
ANION GAP: 13 meq/L — AB (ref 3–11)
AST: 12 U/L (ref 5–34)
Alkaline Phosphatase: 129 U/L (ref 40–150)
BUN: 14.9 mg/dL (ref 7.0–26.0)
CALCIUM: 10 mg/dL (ref 8.4–10.4)
CHLORIDE: 107 meq/L (ref 98–109)
CO2: 20 mEq/L — ABNORMAL LOW (ref 22–29)
Creatinine: 0.7 mg/dL (ref 0.7–1.3)
Glucose: 67 mg/dl — ABNORMAL LOW (ref 70–140)
POTASSIUM: 3.6 meq/L (ref 3.5–5.1)
Sodium: 139 mEq/L (ref 136–145)
TOTAL PROTEIN: 7.5 g/dL (ref 6.4–8.3)
Total Bilirubin: 0.92 mg/dL (ref 0.20–1.20)

## 2014-08-30 MED ORDER — INFLUENZA VAC SPLIT QUAD 0.5 ML IM SUSY
0.5000 mL | PREFILLED_SYRINGE | Freq: Once | INTRAMUSCULAR | Status: AC
  Administered 2014-08-30: 0.5 mL via INTRAMUSCULAR
  Filled 2014-08-30: qty 0.5

## 2014-08-30 MED ORDER — DEXAMETHASONE SODIUM PHOSPHATE 20 MG/5ML IJ SOLN
20.0000 mg | Freq: Once | INTRAMUSCULAR | Status: AC
  Administered 2014-08-30: 20 mg via INTRAVENOUS

## 2014-08-30 MED ORDER — ONDANSETRON 16 MG/50ML IVPB (CHCC)
16.0000 mg | Freq: Once | INTRAVENOUS | Status: AC
  Administered 2014-08-30: 16 mg via INTRAVENOUS

## 2014-08-30 MED ORDER — SODIUM CHLORIDE 0.9 % IV SOLN
504.5000 mg | Freq: Once | INTRAVENOUS | Status: AC
  Administered 2014-08-30: 500 mg via INTRAVENOUS
  Filled 2014-08-30: qty 50

## 2014-08-30 MED ORDER — ONDANSETRON 16 MG/50ML IVPB (CHCC)
INTRAVENOUS | Status: AC
  Filled 2014-08-30: qty 16

## 2014-08-30 MED ORDER — SODIUM CHLORIDE 0.9 % IV SOLN
Freq: Once | INTRAVENOUS | Status: AC
  Administered 2014-08-30: 10:00:00 via INTRAVENOUS

## 2014-08-30 MED ORDER — PACLITAXEL PROTEIN-BOUND CHEMO INJECTION 100 MG
100.0000 mg/m2 | Freq: Once | INTRAVENOUS | Status: AC
  Administered 2014-08-30: 200 mg via INTRAVENOUS
  Filled 2014-08-30: qty 40

## 2014-08-30 MED ORDER — DEXAMETHASONE SODIUM PHOSPHATE 20 MG/5ML IJ SOLN
INTRAMUSCULAR | Status: AC
  Filled 2014-08-30: qty 5

## 2014-08-30 NOTE — Patient Instructions (Signed)
[facility] Discharge Instructions for Patients Receiving Chemotherapy  Today you received the following chemotherapy agents Abraxane, Carboplatin.  To help prevent nausea and vomiting after your treatment, we encourage you to take your nausea medication Compazine 10 mg as ordered   If you develop nausea and vomiting that is not controlled by your nausea medication, call the clinic.   BELOW ARE SYMPTOMS THAT SHOULD BE REPORTED IMMEDIATELY:  *FEVER GREATER THAN 100.5 F  *CHILLS WITH OR WITHOUT FEVER  NAUSEA AND VOMITING THAT IS NOT CONTROLLED WITH YOUR NAUSEA MEDICATION  *UNUSUAL SHORTNESS OF BREATH  *UNUSUAL BRUISING OR BLEEDING  TENDERNESS IN MOUTH AND THROAT WITH OR WITHOUT PRESENCE OF ULCERS  *URINARY PROBLEMS  *BOWEL PROBLEMS  UNUSUAL RASH Items with * indicate a potential emergency and should be followed up as soon as possible.  Feel free to call the clinic you have any questions or concerns. The clinic phone number is [phone].

## 2014-08-30 NOTE — Progress Notes (Signed)
Discharged at 29 with male friend, ambulatory in no distress.

## 2014-08-31 ENCOUNTER — Telehealth: Payer: Self-pay | Admitting: *Deleted

## 2014-08-31 ENCOUNTER — Telehealth: Payer: Self-pay | Admitting: Internal Medicine

## 2014-08-31 NOTE — Telephone Encounter (Signed)
Spoke with pt today for post chemo follow up.  Stated he was doing fine.  Denied nausea/vomiting, stated good appetite and drinking lots of fluids as tolerated.  Has mild problem with constipation - instructed pt to eat dietary foods such as prunes, drink prune juice, take benefiber , and/or stool softener to help with bowel.  Reinforced need to increase water intake.  Stated bladder functions fine.  Denied pain.  Gave pt appt date and time for next f/u appt  09/06/14.  Pt understood to call office with any new issues.

## 2014-08-31 NOTE — Telephone Encounter (Signed)
Daryl Vega returning call.  [phone]

## 2014-08-31 NOTE — Telephone Encounter (Signed)
Called spoke with Cecille Rubin. She reports pt had bronch done by [facility] on 8/6. [employer] denied it, stated they did not feel this type of procedure is medical necessary. Cecille Rubin wants [place] to do an appeal on members behalf to try and get this approved. The letter needs to state why this was medically necessary, include any medical records, reasons this procedure was chose over anything else.  Letter needs to be sent to [facility] at fax # [phone]. Per Cecille Rubin they also require an appeal form for this as well. The form has been placed in [facility] look at. Cecille Rubin would like an update  Please advise [facility] thanks

## 2014-08-31 NOTE — Telephone Encounter (Signed)
LMTCB for [employer]. Need to know what procedure was denied, what the appeal letter needs to state, and where to fax it to.

## 2014-09-01 ENCOUNTER — Telehealth: Payer: Self-pay | Admitting: Oncology

## 2014-09-01 ENCOUNTER — Telehealth: Payer: Self-pay | Admitting: Medical Oncology

## 2014-09-01 ENCOUNTER — Encounter: Payer: Self-pay | Admitting: Medical Oncology

## 2014-09-01 NOTE — Telephone Encounter (Signed)
Called Daryl Vega back and advised him that per Dr. [name], to check with Dr. Earlie Server about returning to work on Monday because he is still receiving chemotherapy treatments.  Daryl Vega verbalized agreement.

## 2014-09-01 NOTE — Telephone Encounter (Addendum)
Requests return to work note. Will pick it up Monday. I sent note to [facility]. Pt still taking pain med . I told pt to call back Monday re Mohamed's decision.

## 2014-09-01 NOTE — Telephone Encounter (Signed)
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 [name] releasing him to go back to work.  She said he is feeling good.  Advised her that Dr. [name] will be notified and that we will call her back.

## 2014-09-01 NOTE — Telephone Encounter (Signed)
Called Daryl Vega back to clarify questions about going back to work on Monday.  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  He denies any dizziness and falls.  He is taking 2 HYDROcodone-acetaminophen (NORCO) 5-325 MG per tablets per day.  He is getting chemotherapy once a week for the next 8 weeks and said he felt fine after his last infusion.  Will forward information to Dr. [name]

## 2014-09-06 ENCOUNTER — Ambulatory Visit (HOSPITAL_BASED_OUTPATIENT_CLINIC_OR_DEPARTMENT_OTHER): Payer: BC Managed Care – PPO | Admitting: Physician Assistant

## 2014-09-06 ENCOUNTER — Other Ambulatory Visit (HOSPITAL_BASED_OUTPATIENT_CLINIC_OR_DEPARTMENT_OTHER): Payer: BC Managed Care – PPO

## 2014-09-06 ENCOUNTER — Other Ambulatory Visit: Payer: BC Managed Care – PPO

## 2014-09-06 ENCOUNTER — Encounter: Payer: Self-pay | Admitting: Physician Assistant

## 2014-09-06 ENCOUNTER — Ambulatory Visit (HOSPITAL_BASED_OUTPATIENT_CLINIC_OR_DEPARTMENT_OTHER): Payer: BC Managed Care – PPO

## 2014-09-06 ENCOUNTER — Telehealth: Payer: Self-pay | Admitting: Internal Medicine

## 2014-09-06 ENCOUNTER — Telehealth: Payer: Self-pay | Admitting: *Deleted

## 2014-09-06 VITALS — BP 119/67 | HR 98 | Temp 98.2°F | Resp 18 | Ht 68.0 in | Wt 165.2 lb

## 2014-09-06 DIAGNOSIS — C3410 Malignant neoplasm of upper lobe, unspecified bronchus or lung: Secondary | ICD-10-CM

## 2014-09-06 DIAGNOSIS — C3491 Malignant neoplasm of unspecified part of right bronchus or lung: Secondary | ICD-10-CM

## 2014-09-06 DIAGNOSIS — Z5111 Encounter for antineoplastic chemotherapy: Secondary | ICD-10-CM

## 2014-09-06 DIAGNOSIS — C3411 Malignant neoplasm of upper lobe, right bronchus or lung: Secondary | ICD-10-CM

## 2014-09-06 DIAGNOSIS — C3490 Malignant neoplasm of unspecified part of unspecified bronchus or lung: Secondary | ICD-10-CM

## 2014-09-06 LAB — CBC WITH DIFFERENTIAL/PLATELET
BASO%: 0.9 % (ref 0.0–2.0)
Basophils Absolute: 0 10*3/uL (ref 0.0–0.1)
EOS%: 3 % (ref 0.0–7.0)
Eosinophils Absolute: 0.2 10*3/uL (ref 0.0–0.5)
HCT: 36.4 % — ABNORMAL LOW (ref 38.4–49.9)
HGB: 12 g/dL — ABNORMAL LOW (ref 13.0–17.1)
LYMPH#: 0.7 10*3/uL — AB (ref 0.9–3.3)
LYMPH%: 13.3 % — ABNORMAL LOW (ref 14.0–49.0)
MCH: 28.1 pg (ref 27.2–33.4)
MCHC: 33 g/dL (ref 32.0–36.0)
MCV: 85.2 fL (ref 79.3–98.0)
MONO#: 0.4 10*3/uL (ref 0.1–0.9)
MONO%: 7.7 % (ref 0.0–14.0)
NEUT#: 4 10*3/uL (ref 1.5–6.5)
NEUT%: 75.1 % — ABNORMAL HIGH (ref 39.0–75.0)
Platelets: 216 10*3/uL (ref 140–400)
RBC: 4.27 10*6/uL (ref 4.20–5.82)
RDW: 17.4 % — AB (ref 11.0–14.6)
WBC: 5.3 10*3/uL (ref 4.0–10.3)

## 2014-09-06 LAB — COMPREHENSIVE METABOLIC PANEL (CC13)
ALBUMIN: 3.1 g/dL — AB (ref 3.5–5.0)
ALT: 21 U/L (ref 0–55)
AST: 13 U/L (ref 5–34)
Alkaline Phosphatase: 90 U/L (ref 40–150)
Anion Gap: 10 mEq/L (ref 3–11)
BUN: 12.9 mg/dL (ref 7.0–26.0)
CALCIUM: 9.5 mg/dL (ref 8.4–10.4)
CHLORIDE: 108 meq/L (ref 98–109)
CO2: 23 mEq/L (ref 22–29)
Creatinine: 0.8 mg/dL (ref 0.7–1.3)
Glucose: 151 mg/dl — ABNORMAL HIGH (ref 70–140)
POTASSIUM: 3.7 meq/L (ref 3.5–5.1)
Sodium: 141 mEq/L (ref 136–145)
Total Bilirubin: 0.51 mg/dL (ref 0.20–1.20)
Total Protein: 7 g/dL (ref 6.4–8.3)

## 2014-09-06 MED ORDER — ONDANSETRON 8 MG/NS 50 ML IVPB
INTRAVENOUS | Status: AC
  Filled 2014-09-06: qty 8

## 2014-09-06 MED ORDER — DEXAMETHASONE SODIUM PHOSPHATE 10 MG/ML IJ SOLN
INTRAMUSCULAR | Status: AC
  Filled 2014-09-06: qty 1

## 2014-09-06 MED ORDER — PROCHLORPERAZINE MALEATE 10 MG PO TABS: 10.0000 mg | ORAL_TABLET | Freq: Four times a day (QID) | ORAL | Status: DC | PRN

## 2014-09-06 MED ORDER — HYDROCODONE-ACETAMINOPHEN 5-325 MG PO TABS
ORAL_TABLET | ORAL | Status: DC
Start: 2014-09-06 — End: 2014-09-27

## 2014-09-06 MED ORDER — SODIUM CHLORIDE 0.9 % IV SOLN
Freq: Once | INTRAVENOUS | Status: AC
  Administered 2014-09-06: 11:00:00 via INTRAVENOUS

## 2014-09-06 MED ORDER — PACLITAXEL PROTEIN-BOUND CHEMO INJECTION 100 MG
100.0000 mg/m2 | Freq: Once | INTRAVENOUS | Status: AC
  Administered 2014-09-06: 200 mg via INTRAVENOUS
  Filled 2014-09-06: qty 40

## 2014-09-06 MED ORDER — ONDANSETRON 8 MG/50ML IVPB (CHCC)
8.0000 mg | Freq: Once | INTRAVENOUS | Status: AC
  Administered 2014-09-06: 8 mg via INTRAVENOUS

## 2014-09-06 MED ORDER — DEXAMETHASONE SODIUM PHOSPHATE 10 MG/ML IJ SOLN
10.0000 mg | Freq: Once | INTRAMUSCULAR | Status: AC
  Administered 2014-09-06: 10 mg via INTRAVENOUS

## 2014-09-06 NOTE — Telephone Encounter (Signed)
Message copied by Reyah Streeter, [place] on Wed Sep 06, 2014 10:38 AM ------      Message from: Curt Bears      Created: Fri Sep 01, 2014  9:03 PM       Yes. No driving.      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  He is still taking pain med. Radiation nurse called and said Dr [name] thought pt was still a little shaky and probably should not go back to work at this point.       Do you agree?            Colletta Maryland please call him Monday with answer.       ------

## 2014-09-06 NOTE — Progress Notes (Addendum)
[facility] [phone]   [phone]  SHARED VISIT PROGRESS NOTE  [name], MD [place] [address]  DIAGNOSIS: Stage IIIB (T3, N3, M0) non-small cell lung cancer, squamous cell carcinoma presenting with right upper lobe obstructing mass as well as mediastinal and right supraclavicular lymphadenopathy diagnosed in August of 2015.  PRIOR THERAPY: status post palliative radiotherapy to the obstructing lung mass in the right upper lobe under the care of Dr. [name]  completed on 08/23/2014.  CURRENT THERAPY: 1)  systemic chemotherapy with carboplatin for AUC of 5 on day 1 and Abraxane 100 mg/M2 on days 1, 8 and 15 every 3 weeks. First dose 08/30/2014.  INTERVAL HISTORY: Daryl Vega 68 y.o. male returns to the clinic today for followup visit accompanied by his girlfriend. He is status post day 1 of cycle #1. He reports constipation that is well managed with Hardin Negus capsules. He alo reports some fatigue and generalized malaise. He has had decreased appetite, stating that food just doesn't taste right. He reports cough, occasionally productive of green secretions not associated with fever or chills.ain that started an hour before the visit. He is tolerating his course of palliative radiotherapy fairly well with no significant adverse effects. He continues to have shortness of breath at baseline and increased with exertion. He denied having any significant chest pain. He has no fever or chills, no nausea or vomiting. 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  He has noticed a significant improvement in his breathing.   MEDICAL HISTORY: Past Medical History  Diagnosis Date  . DM (diabetes mellitus)   . Pneumonia   . Hypercholesteremia   . Melanoma     ALLERGIES:  has No Known Allergies.  MEDICATIONS:  Current Outpatient Prescriptions  Medication Sig Dispense Refill  .  atorvastatin (LIPITOR) 80 MG tablet Take 80 mg by mouth daily.      . budesonide-formoterol (SYMBICORT) 160-4.5 MCG/ACT inhaler Take 2 puffs first thing in am and then another 2 puffs about 12 hours later.      . Cholecalciferol (VITAMIN D3 PO) Take 10,000 Units by mouth daily.      . Cinnamon 500 MG capsule Take 500 mg by mouth daily.      . Dapagliflozin Propanediol (FARXIGA) 5 MG TABS Take 5 mg by mouth 2 (two) times daily.      [name] glimepiride (AMARYL) 4 MG tablet Take 4 mg by mouth 2 (two) times daily. Take before meals      . hyaluronate sodium (RADIAPLEXRX) GEL Apply 1 application topically once.      [name] HYDROcodone-acetaminophen (NORCO) 5-325 MG per tablet 1-2 up to every 4 hours for pain or cough  40 tablet  0  . LORazepam (ATIVAN) 0.5 MG tablet Take 1 tablet (0.5 mg total) by mouth every 8 (eight) hours.  30 tablet  0  . metFORMIN (GLUCOPHAGE) 500 MG tablet Take 500 mg by mouth 2 (two) times daily with a meal.      . omeprazole (PRILOSEC) 20 MG capsule Take 20 mg by mouth daily.      . prochlorperazine (COMPAZINE) 10 MG tablet Take 1 tablet (10 mg total) by mouth every 6 (six) hours as needed for nausea or vomiting.  30 tablet  1  . temazepam (RESTORIL) 15 MG capsule Take 15 mg by mouth at bedtime as needed for sleep.  No current facility-administered medications for this visit.    SURGICAL HISTORY:  Past Surgical History  Procedure Laterality Date  . Video bronchoscopy Bilateral 07/20/2014    Procedure: VIDEO BRONCHOSCOPY WITHOUT FLUORO;  Surgeon: Tanda Rockers, MD;  Location: [facility];  Service: Cardiopulmonary;  Laterality: Bilateral;    REVIEW OF SYSTEMS:  Constitutional: positive for fatigue and malaise Eyes: negative Ears, nose, mouth, throat, and face: negative Respiratory: positive for cough and dyspnea on exertion Cardiovascular: negative Gastrointestinal: positive for constipation Genitourinary:negative Integument/breast: negative Hematologic/lymphatic:  negative Musculoskeletal:negative Neurological: negative Behavioral/Psych: negative Endocrine: negative Allergic/Immunologic: negative   PHYSICAL EXAMINATION: General appearance: alert, cooperative, fatigued and no distress Head: Normocephalic, without obvious abnormality, atraumatic Neck: no adenopathy, no JVD, supple, symmetrical, trachea midline and thyroid not enlarged, symmetric, no tenderness/mass/nodules Lymph nodes: Cervical, supraclavicular, and axillary nodes normal. Resp: rales RML and RUL and wheezes RML and RUL Back: symmetric, no curvature. ROM normal. No CVA tenderness. Cardio: regular rate and rhythm, S1, S2 normal, no murmur, click, rub or gallop GI: soft, non-tender; bowel sounds normal; no masses,  no organomegaly Extremities: extremities normal, atraumatic, no cyanosis or edema Neurologic: Alert and oriented X 3, normal strength and tone. Normal symmetric reflexes. Normal coordination and gait  ECOG PERFORMANCE STATUS: 1 - Symptomatic but completely ambulatory  Blood pressure 119/67, pulse 98, temperature 98.2 F (36.8 C), temperature source Oral, resp. rate 18, height 5\' 8"  (1.727 m), weight 165 lb 3.2 oz (74.934 kg).  LABORATORY DATA: Lab Results  Component Value Date   WBC 5.3 09/06/2014   HGB 12.0* 09/06/2014   HCT 36.4* 09/06/2014   MCV 85.2 09/06/2014   PLT 216 09/06/2014      Chemistry      Component Value Date/Time   NA 141 09/06/2014 0905   NA 142 07/17/2014 0904   K 3.7 09/06/2014 0905   K 4.4 07/17/2014 0904   CL 108 07/17/2014 0904   CO2 23 09/06/2014 0905   CO2 24 07/17/2014 0904   BUN 12.9 09/06/2014 0905   BUN 12 07/17/2014 0904   CREATININE 0.8 09/06/2014 0905   CREATININE 0.9 07/17/2014 0904      Component Value Date/Time   CALCIUM 9.5 09/06/2014 0905   CALCIUM 9.2 07/17/2014 0904   ALKPHOS 90 09/06/2014 0905   AST 13 09/06/2014 0905   ALT 21 09/06/2014 0905   BILITOT 0.51 09/06/2014 0905       RADIOGRAPHIC STUDIES: Dg Eye Foreign Body  08/01/2014    CLINICAL DATA:  Implanted hearing aids.  Pre MRI evaluation.  EXAM: ORBITS FOR FOREIGN BODY - 2 VIEW  COMPARISON:  Sinus CT 07/17/2014  FINDINGS: Evidence for bilateral electronic hearing aid devices. No radiopaque foreign bodies or metallic structures in the region of the orbits. Small metallic structures associated with the maxilla.  IMPRESSION: No evidence of metallic foreign body within the orbits.  Bilateral hearing aid devices.   Electronically Signed   By: Markus Daft M.D.   On: 08/01/2014 11:59   Dg Chest 2 View  07/31/2014   CLINICAL DATA:  Cough.  Short of breath.  Fever.  EXAM: CHEST  2 VIEW  COMPARISON:  PET-CT, 07/26/2014.  Chest radiograph, 07/11/2014.  FINDINGS: Dense consolidation in the right upper lobe reflects postobstructive pneumonitis. Lobe could be secondarily infected. Mild reticular lung base opacity, mostly on the left, is likely subsegmental atelectasis. No other areas of lung consolidation. No pleural effusion.  Known right hilar mass is obscured by the contiguous right upper lobe consolidation.  No left hilar mass. Mild thickening along the right peritracheal stripe. Cardiac silhouette is normal in size.  Bony thorax is intact.  IMPRESSION: 1. Dense consolidation in the right upper lobe reflects post obstruction change, likely postobstructive pneumonitis. Secondary pneumonia is possible. The extent of the consolidation is similar to the recent PET-CT. 2. No other areas of lung consolidation. No pulmonary edema. Mild basilar subsegmental atelectasis. No pleural effusion.   Electronically Signed   By: Lajean Manes M.D.   On: 07/31/2014 13:09   Mr Jeri Cos SV Contrast  08/01/2014   CLINICAL DATA:  68 year old male with confusion and recent diagnosis of lung cancer. Staging. Subsequent encounter.  EXAM: MRI HEAD WITHOUT AND WITH CONTRAST  TECHNIQUE: Multiplanar, multiecho pulse sequences of the brain and surrounding structures were obtained without and with intravenous contrast.   CONTRAST:  25mL MULTIHANCE GADOBENATE DIMEGLUMINE 529 MG/ML IV SOLN  COMPARISON:  Paranasal sinus CT 07/17/2014.  FINDINGS: No abnormal enhancement identified. No midline shift, mass effect, or evidence of intracranial mass lesion.  Susceptibility artifact related to dental hardware, mildly degrades some portions of this exam. Cerebral volume is within normal limits for age. No restricted diffusion to suggest acute infarction. No ventriculomegaly, extra-axial collection or acute intracranial hemorrhage. Cervicomedullary junction and pituitary are within normal limits. Major intracranial vascular flow voids are preserved, dominant distal left vertebral artery. Pearline Cables and white matter signal is within normal limits for age throughout the brain.  Degenerative disc and endplate degeneration at C3-C4 resulting in cervical spinal stenosis, appears to be mild.  Visualized bone marrow signal is within normal limits. Visible internal auditory structures appear normal. Trace inferior right mastoid effusion. Negative visible paranasal sinuses. Postoperative changes to the globes. Visualized scalp soft tissues are within normal limits.  IMPRESSION: No acute or metastatic intracranial abnormality.   Electronically Signed   By: Lars Pinks M.D.   On: 08/01/2014 20:29   Nm Pet Image Initial (pi) Skull Base To Thigh  07/26/2014   CLINICAL DATA:  Initial treatment strategy for lung nodule.  EXAM: NUCLEAR MEDICINE PET SKULL BASE TO THIGH  TECHNIQUE: 8.5 mCi F-18 FDG was injected intravenously. Full-ring PET imaging was performed from the skull base to thigh after the radiotracer. CT data was obtained and used for attenuation correction and anatomic localization.  FASTING BLOOD GLUCOSE:  Value: 151 mg/dl  COMPARISON:  CT chest 07/17/2014.  FINDINGS: NECK  No hypermetabolic lymph nodes in the neck. CT images show no acute findings.  CHEST  A right supraclavicular lymph node measures 9 mm (CT image 42) with an SUV max of 2.8. Mediastinal  lymph nodes measure up to 12 mm in the low right paratracheal station with an SUV max of 3.8. Right hilar mass was better measured on 07/17/2014 and has an SUV max of 7.2.  When coronal images from diagnostic examination performed 07/17/2014 are reviewed, the right hilar mass appears to come within 1.7 cm of the carina. Right upper lobe bronchus is completely obstructed with marked progression of postobstructive consolidation and pneumonitis in the right upper lobe, which has an SUV max of 9.2 anteriorly. There is narrowing of the bronchus intermedius and right lower lobe bronchus as well, with new postobstructive pneumonitis in the right lower lobe.  CT images show atherosclerotic calcification of the arterial vasculature, including three-vessel involvement of the coronary arteries. Heart size normal. Tiny amount of pericardial fluid may be physiologic. No pleural fluid. Respiratory motion degrades image quality upon review of the lung windows. There is  persistent ground-glass airspace disease in the left lower lobe. Nodular consolidation in the left lower lobe (series 8, image 57) is seen as well.  ABDOMEN/PELVIS  No abnormal hypermetabolism within the liver, adrenal glands, spleen or pancreas. No hypermetabolic lymph nodes.  CT images show the liver to be grossly unremarkable. A large stone fills the fundus of the gallbladder, measuring approximately 3.6 cm. A 1.6 cm low density nodule in the right adrenal gland is stable. Left adrenal gland is unremarkable. Low-attenuation lesions in the kidneys measure up to 1.5 cm on the right and are difficult to definitively characterize without post-contrast imaging. Tiny stones in the right kidney. Spleen, pancreas, stomach and bowel are grossly unremarkable.  Atherosclerotic calcification of the arterial vasculature without abdominal aortic aneurysm. Small bilateral inguinal hernias contain fat. No free fluid.  SKELETON  No abnormal osseous hypermetabolism.  IMPRESSION:  1. Hypermetabolic right hilar mass with hypermetabolic ipsilateral mediastinal and right supraclavicular lymph nodes, most consistent with primary bronchogenic carcinoma. Given apparent distance of less than 2 cm from the carina, findings are worrisome for T3 N3 M0 or stage IIIB disease. 2. Complete obstruction of the right upper lobe bronchus with marked narrowing of the bronchus intermedius and right lower lobe bronchus. Progressive postobstructive pneumonitis in the right upper lobe with new postobstructive changes in the right lower lobe. 3. Persistent ground-glass and nodular airspace disease in the left lower lobe, better imaged on 07/17/2014. 4. Tiny amount of pericardial fluid may be physiologic. 5. Three-vessel coronary artery calcification. 6. Right adrenal adenoma. 7. Tiny right renal stones. 8. Small bilateral inguinal hernias contain fat.   Electronically Signed   By: Lorin Picket M.D.   On: 07/26/2014 09:16    ASSESSMENT AND PLAN: This is a very pleasant 68 years old white male recently diagnosed with a stage IIIB non-small cell lung cancer status post a short course of palliative radiotherapy to the obstructing mass in the right upper lobe. He is currently being treated with systemic chemotherapy with carboplatin for AUC of 5 on day 1 and Abraxane 100 mg/M2 on days 1, 8 and 15 every 3 weeks. He is status post day 1 of cycle# 1.The patient was discussed with and also seen by Dr. Julien Nordmann. He will continue with his chemotherapy as scheduled and return in 2 weeks for another symptom management visit at the start of cycle #2.REgarding returning to work, he was advised to see how he tolerates chemotherapy and perhaps to wait until after the first restaging CT scan. He was agreeable to this recommendation. He was advised to call immediately if he has any concerning symptoms in the interval. The patient voices understanding of current disease status and treatment options and is in agreement with the  current care plan.  All questions were answered. The patient knows to call the clinic with any problems, questions or concerns. We can certainly see the patient much sooner if necessary.  Carlton Adam PA-C  ADDENDUM: Hematology/Oncology Attending:  I had a face to face encounter with the patient. I recommended to his care plan. This is a very pleasant 68 years old white male with history of stage IIIB non-small cell lung cancer, squamous cell carcinoma status post short course of palliative radiotherapy to the obstructing right upper lobe lung mass. He is currently undergoing systemic chemotherapy with carboplatin and Abraxane status post 1 weekly treatment and tolerating his treatment fairly well. 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 I strongly advise him not to go back to work at this point until completion of his treatment and significant improvement of his disease. He would come back for followup visit in 2 weeks with the start of cycle #2 of his chemotherapy. He was advised to call immediately if he has any concerning symptoms in the interval.   Disclaimer: This note was dictated with voice recognition software. Similar sounding words can inadvertently be transcribed and may not be corrected upon review. Eilleen Kempf., MD 09/11/2014

## 2014-09-06 NOTE — Patient Instructions (Signed)
[facility] Discharge Instructions for Patients Receiving Chemotherapy  Today you received the following chemotherapy agents Abraxane   To help prevent nausea and vomiting after your treatment, we encourage you to take your nausea medication Compazine 10 mg every 6 hours as needed   If you develop nausea and vomiting that is not controlled by your nausea medication, call the clinic.   BELOW ARE SYMPTOMS THAT SHOULD BE REPORTED IMMEDIATELY:  *FEVER GREATER THAN 100.5 F  *CHILLS WITH OR WITHOUT FEVER  NAUSEA AND VOMITING THAT IS NOT CONTROLLED WITH YOUR NAUSEA MEDICATION  *UNUSUAL SHORTNESS OF BREATH  *UNUSUAL BRUISING OR BLEEDING  TENDERNESS IN MOUTH AND THROAT WITH OR WITHOUT PRESENCE OF ULCERS  *URINARY PROBLEMS  *BOWEL PROBLEMS  UNUSUAL RASH Items with * indicate a potential emergency and should be followed up as soon as possible.  Feel free to call the clinic you have any questions or concerns. The clinic phone number is [phone].

## 2014-09-06 NOTE — Telephone Encounter (Signed)
Daryl Vega is following up on this- [phone]

## 2014-09-06 NOTE — Telephone Encounter (Signed)
Let them know I am sending his office visits which speak for themselves - he had a suspected post obstructive pneumonia by CT proved to be a squamous cell lung ca at FOB: the standard way to evaluate this was correctly used

## 2014-09-06 NOTE — Telephone Encounter (Signed)
gv adn printed appt sched and avs for pt for Sept thru Dec.....sed added tx.

## 2014-09-06 NOTE — Telephone Encounter (Signed)
Please advise if this has been taken care of. Thanks.

## 2014-09-06 NOTE — Telephone Encounter (Signed)
Pt is in the office today for a f/u visit.  Awilda Metro addressed not driving with patient at his f/u visit.

## 2014-09-06 NOTE — Telephone Encounter (Signed)
Per MW- fax the last ov note  This was done and nothing further needed

## 2014-09-06 NOTE — Telephone Encounter (Signed)
Called spoke with Cecille Rubin and discussed MW's response with her.  Cecille Rubin verbalized her understanding but stated that her employer is the broker for pt's employer.  [employer] is requiring that we prove the medical necessity for the bronchoscopy - the ov notes and form obtained by Mindy on 9.17.15 will need to be faxed directly to [facility].  Because the fax was obtained nearly 1 week ago, I have reprinted this and filled it out to the best of my ability and have dictated MW's response into the "comments" section.  The form does request CPT codes and a claim ID number, will need to speak with Vallarie Mare when she returns to the office tomorrow -- also needs date of notification of payment so we may need to call [employer] to obtain this info.  CT Chest and ov notes printed and attached to the form. Lori requests update once this is completed. Will place form in MW's lookat.

## 2014-09-08 ENCOUNTER — Telehealth: Payer: Self-pay | Admitting: Internal Medicine

## 2014-09-08 NOTE — Telephone Encounter (Signed)
lmtcb x1 for Daryl Vega

## 2014-09-10 NOTE — Patient Instructions (Signed)
Continue with lab and chemotherapy as scheduled Follow up in 2 weeks

## 2014-09-11 NOTE — Telephone Encounter (Signed)
[name] returned call & can be reached at number previously left.  Daryl Vega

## 2014-09-11 NOTE — Telephone Encounter (Signed)
Called spoke with laurie. She wanted to make sure we have faxed all the info in. I advised her it was taken care of per 9/17 phone note. Nothing further needed

## 2014-09-13 ENCOUNTER — Other Ambulatory Visit (HOSPITAL_BASED_OUTPATIENT_CLINIC_OR_DEPARTMENT_OTHER): Payer: BC Managed Care – PPO

## 2014-09-13 ENCOUNTER — Ambulatory Visit (HOSPITAL_BASED_OUTPATIENT_CLINIC_OR_DEPARTMENT_OTHER): Payer: BC Managed Care – PPO

## 2014-09-13 VITALS — BP 108/75 | HR 99 | Temp 97.6°F | Resp 18

## 2014-09-13 DIAGNOSIS — C3410 Malignant neoplasm of upper lobe, unspecified bronchus or lung: Secondary | ICD-10-CM

## 2014-09-13 DIAGNOSIS — C3491 Malignant neoplasm of unspecified part of right bronchus or lung: Secondary | ICD-10-CM

## 2014-09-13 DIAGNOSIS — C3411 Malignant neoplasm of upper lobe, right bronchus or lung: Secondary | ICD-10-CM

## 2014-09-13 DIAGNOSIS — Z5111 Encounter for antineoplastic chemotherapy: Secondary | ICD-10-CM

## 2014-09-13 LAB — CBC WITH DIFFERENTIAL/PLATELET
BASO%: 1.2 % (ref 0.0–2.0)
BASOS ABS: 0.1 10*3/uL (ref 0.0–0.1)
EOS ABS: 0 10*3/uL (ref 0.0–0.5)
EOS%: 0.9 % (ref 0.0–7.0)
HCT: 36.3 % — ABNORMAL LOW (ref 38.4–49.9)
HEMOGLOBIN: 12.3 g/dL — AB (ref 13.0–17.1)
LYMPH%: 22.6 % (ref 14.0–49.0)
MCH: 28.7 pg (ref 27.2–33.4)
MCHC: 33.9 g/dL (ref 32.0–36.0)
MCV: 84.8 fL (ref 79.3–98.0)
MONO#: 0.4 10*3/uL (ref 0.1–0.9)
MONO%: 8.8 % (ref 0.0–14.0)
NEUT%: 66.5 % (ref 39.0–75.0)
NEUTROS ABS: 2.9 10*3/uL (ref 1.5–6.5)
PLATELETS: 213 10*3/uL (ref 140–400)
RBC: 4.28 10*6/uL (ref 4.20–5.82)
RDW: 16.1 % — ABNORMAL HIGH (ref 11.0–14.6)
WBC: 4.3 10*3/uL (ref 4.0–10.3)
lymph#: 1 10*3/uL (ref 0.9–3.3)
nRBC: 1 % — ABNORMAL HIGH (ref 0–0)

## 2014-09-13 LAB — COMPREHENSIVE METABOLIC PANEL (CC13)
ALK PHOS: 84 U/L (ref 40–150)
ALT: 21 U/L (ref 0–55)
ANION GAP: 6 meq/L (ref 3–11)
AST: 11 U/L (ref 5–34)
Albumin: 3.3 g/dL — ABNORMAL LOW (ref 3.5–5.0)
BILIRUBIN TOTAL: 0.48 mg/dL (ref 0.20–1.20)
BUN: 11.8 mg/dL (ref 7.0–26.0)
CO2: 24 meq/L (ref 22–29)
CREATININE: 0.7 mg/dL (ref 0.7–1.3)
Calcium: 9.2 mg/dL (ref 8.4–10.4)
Chloride: 110 mEq/L — ABNORMAL HIGH (ref 98–109)
Glucose: 78 mg/dl (ref 70–140)
Potassium: 3.8 mEq/L (ref 3.5–5.1)
Sodium: 139 mEq/L (ref 136–145)
Total Protein: 6.9 g/dL (ref 6.4–8.3)

## 2014-09-13 LAB — TECHNOLOGIST REVIEW

## 2014-09-13 MED ORDER — ONDANSETRON 8 MG/50ML IVPB (CHCC)
8.0000 mg | Freq: Once | INTRAVENOUS | Status: AC
  Administered 2014-09-13: 8 mg via INTRAVENOUS

## 2014-09-13 MED ORDER — DEXAMETHASONE SODIUM PHOSPHATE 10 MG/ML IJ SOLN
INTRAMUSCULAR | Status: AC
  Filled 2014-09-13: qty 1

## 2014-09-13 MED ORDER — SODIUM CHLORIDE 0.9 % IV SOLN
Freq: Once | INTRAVENOUS | Status: AC
  Administered 2014-09-13: 14:00:00 via INTRAVENOUS

## 2014-09-13 MED ORDER — DEXAMETHASONE SODIUM PHOSPHATE 10 MG/ML IJ SOLN
10.0000 mg | Freq: Once | INTRAMUSCULAR | Status: AC
  Administered 2014-09-13: 10 mg via INTRAVENOUS

## 2014-09-13 MED ORDER — ONDANSETRON 8 MG/NS 50 ML IVPB
INTRAVENOUS | Status: AC
  Filled 2014-09-13: qty 8

## 2014-09-13 MED ORDER — PACLITAXEL PROTEIN-BOUND CHEMO INJECTION 100 MG
100.0000 mg/m2 | Freq: Once | INTRAVENOUS | Status: AC
  Administered 2014-09-13: 200 mg via INTRAVENOUS
  Filled 2014-09-13: qty 40

## 2014-09-13 NOTE — Patient Instructions (Signed)
[facility] Discharge Instructions for Patients Receiving Chemotherapy  Today you received the following chemotherapy agents: Abraxane.  To help prevent nausea and vomiting after your treatment, we encourage you to take your nausea medication as prescribed.   If you develop nausea and vomiting that is not controlled by your nausea medication, call the clinic.   BELOW ARE SYMPTOMS THAT SHOULD BE REPORTED IMMEDIATELY:  *FEVER GREATER THAN 100.5 F  *CHILLS WITH OR WITHOUT FEVER  NAUSEA AND VOMITING THAT IS NOT CONTROLLED WITH YOUR NAUSEA MEDICATION  *UNUSUAL SHORTNESS OF BREATH  *UNUSUAL BRUISING OR BLEEDING  TENDERNESS IN MOUTH AND THROAT WITH OR WITHOUT PRESENCE OF ULCERS  *URINARY PROBLEMS  *BOWEL PROBLEMS  UNUSUAL RASH Items with * indicate a potential emergency and should be followed up as soon as possible.  Feel free to call the clinic you have any questions or concerns. The clinic phone number is [phone].

## 2014-09-18 ENCOUNTER — Telehealth: Payer: Self-pay | Admitting: Oncology

## 2014-09-18 NOTE — Telephone Encounter (Signed)
Called in refill per Dr. [name] to [facility], [place] for LORazepam (ATIVAN) 0.5 MG tablet 09/18/2014 Sig - Route: Take 0.5 mg by mouth every 8 (eight) hours. 30 tablets. No refills.  Called Mr. Degollado and let him know that it had been called in.

## 2014-09-19 ENCOUNTER — Other Ambulatory Visit (HOSPITAL_BASED_OUTPATIENT_CLINIC_OR_DEPARTMENT_OTHER): Payer: BC Managed Care – PPO

## 2014-09-19 ENCOUNTER — Encounter: Payer: Self-pay | Admitting: Physician Assistant

## 2014-09-19 ENCOUNTER — Telehealth: Payer: Self-pay | Admitting: Physician Assistant

## 2014-09-19 ENCOUNTER — Ambulatory Visit (HOSPITAL_BASED_OUTPATIENT_CLINIC_OR_DEPARTMENT_OTHER): Payer: BC Managed Care – PPO | Admitting: Physician Assistant

## 2014-09-19 VITALS — BP 116/75 | HR 119 | Temp 98.2°F | Resp 17 | Ht 68.0 in | Wt 165.0 lb

## 2014-09-19 DIAGNOSIS — C3491 Malignant neoplasm of unspecified part of right bronchus or lung: Secondary | ICD-10-CM

## 2014-09-19 DIAGNOSIS — C3411 Malignant neoplasm of upper lobe, right bronchus or lung: Secondary | ICD-10-CM

## 2014-09-19 DIAGNOSIS — R918 Other nonspecific abnormal finding of lung field: Secondary | ICD-10-CM

## 2014-09-19 LAB — COMPREHENSIVE METABOLIC PANEL (CC13)
ALT: 32 U/L (ref 0–55)
ANION GAP: 7 meq/L (ref 3–11)
AST: 15 U/L (ref 5–34)
Albumin: 3.4 g/dL — ABNORMAL LOW (ref 3.5–5.0)
Alkaline Phosphatase: 107 U/L (ref 40–150)
BUN: 16 mg/dL (ref 7.0–26.0)
CHLORIDE: 108 meq/L (ref 98–109)
CO2: 22 meq/L (ref 22–29)
CREATININE: 0.9 mg/dL (ref 0.7–1.3)
Calcium: 9.3 mg/dL (ref 8.4–10.4)
Glucose: 227 mg/dl — ABNORMAL HIGH (ref 70–140)
Potassium: 3.9 mEq/L (ref 3.5–5.1)
Sodium: 137 mEq/L (ref 136–145)
Total Bilirubin: 0.64 mg/dL (ref 0.20–1.20)
Total Protein: 6.9 g/dL (ref 6.4–8.3)

## 2014-09-19 LAB — CBC WITH DIFFERENTIAL/PLATELET
BASO%: 1.3 % (ref 0.0–2.0)
BASOS ABS: 0.1 10*3/uL (ref 0.0–0.1)
EOS%: 0.6 % (ref 0.0–7.0)
Eosinophils Absolute: 0 10*3/uL (ref 0.0–0.5)
HEMATOCRIT: 37.2 % — AB (ref 38.4–49.9)
HGB: 12.3 g/dL — ABNORMAL LOW (ref 13.0–17.1)
LYMPH#: 0.9 10*3/uL (ref 0.9–3.3)
LYMPH%: 22 % (ref 14.0–49.0)
MCH: 28.3 pg (ref 27.2–33.4)
MCHC: 33.1 g/dL (ref 32.0–36.0)
MCV: 85.4 fL (ref 79.3–98.0)
MONO#: 0.3 10*3/uL (ref 0.1–0.9)
MONO%: 7.7 % (ref 0.0–14.0)
NEUT#: 2.7 10*3/uL (ref 1.5–6.5)
NEUT%: 68.4 % (ref 39.0–75.0)
Platelets: 207 10*3/uL (ref 140–400)
RBC: 4.35 10*6/uL (ref 4.20–5.82)
RDW: 17.8 % — ABNORMAL HIGH (ref 11.0–14.6)
WBC: 3.9 10*3/uL — AB (ref 4.0–10.3)

## 2014-09-19 LAB — TECHNOLOGIST REVIEW

## 2014-09-19 NOTE — Telephone Encounter (Signed)
, °

## 2014-09-19 NOTE — Progress Notes (Addendum)
[facility] Telephone:([phone]) [phone]   Fax:(336) [phone]  SHARED VISIT PROGRESS NOTE  Daryl Ou, MD [place] [address]  DIAGNOSIS: Stage IIIB (T3, N3, M0) non-small cell lung cancer, squamous cell carcinoma presenting with right upper lobe obstructing mass as well as mediastinal and right supraclavicular lymphadenopathy diagnosed in August of 2015.  PRIOR THERAPY: status post palliative radiotherapy to the obstructing lung mass in the right upper lobe under the care of Dr. [name]  completed on 08/23/2014.  CURRENT THERAPY: 1)  systemic chemotherapy with carboplatin for AUC of 5 on day 1 and Abraxane 100 mg/M2 on days 1, 8 and 15 every 3 weeks. First dose 08/30/2014. Status post 1 cycle.  INTERVAL HISTORY: Daryl Vega 68 y.o. male returns to the clinic today for followup visit unaccompanied.  He is status post 1 cycle of his systemic chemotherapy with carboplatin and Abraxane.Daryl Vega He reports constipation that is well managed with Hardin Negus capsules. He presents to proceed with cycle #2. He also reports some fatigue and generalized malaise. His appetite has improved slightly. He continues to have shortness of breath at baseline and increased with exertion. He denied having any significant chest pain. He has no fever or chills, no nausea or vomiting. 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  He has noticed a significant improvement in his breathing.   MEDICAL HISTORY: Past Medical History  Diagnosis Date  . DM (diabetes mellitus)   . Pneumonia   . Hypercholesteremia   . Melanoma     ALLERGIES:  has No Known Allergies.  MEDICATIONS:  Current Outpatient Prescriptions  Medication Sig Dispense Refill  . atorvastatin (LIPITOR) 80 MG tablet Take 80 mg by mouth daily.      . budesonide-formoterol (SYMBICORT) 160-4.5 MCG/ACT inhaler Take 2 puffs first thing in am and then another 2  puffs about 12 hours later.      . Cholecalciferol (VITAMIN D3 PO) Take 10,000 Units by mouth daily.      . Cinnamon 500 MG capsule Take 500 mg by mouth daily.      . Dapagliflozin Propanediol (FARXIGA) 5 MG TABS Take 5 mg by mouth 2 (two) times daily.      Daryl Vega glimepiride (AMARYL) 4 MG tablet Take 4 mg by mouth 2 (two) times daily. Take before meals      . hyaluronate sodium (RADIAPLEXRX) GEL Apply 1 application topically once.      Daryl Vega HYDROcodone-acetaminophen (NORCO) 5-325 MG per tablet 1-2 up to every 4 hours for pain or cough  40 tablet  0  . LORazepam (ATIVAN) 0.5 MG tablet Take 0.5 mg by mouth every 8 (eight) hours. Called in to [facility], [place] per Dr. [name].      . metFORMIN (GLUCOPHAGE) 500 MG tablet Take 500 mg by mouth 2 (two) times daily with a meal.      . omeprazole (PRILOSEC) 20 MG capsule Take 20 mg by mouth daily.      . prochlorperazine (COMPAZINE) 10 MG tablet Take 1 tablet (10 mg total) by mouth every 6 (six) hours as needed for nausea or vomiting.  30 tablet  1  . temazepam (RESTORIL) 15 MG capsule Take 15 mg by mouth at bedtime as needed for sleep.       No current facility-administered medications for this visit.    SURGICAL HISTORY:  Past Surgical History  Procedure Laterality Date  .  Video bronchoscopy Bilateral 07/20/2014    Procedure: VIDEO BRONCHOSCOPY WITHOUT FLUORO;  Surgeon: Tanda Rockers, MD;  Location: [facility];  Service: Cardiopulmonary;  Laterality: Bilateral;    REVIEW OF SYSTEMS:  Constitutional: positive for fatigue and malaise Eyes: negative Ears, nose, mouth, throat, and face: negative Respiratory: positive for cough and dyspnea on exertion Cardiovascular: negative Gastrointestinal: positive for constipation Genitourinary:negative Integument/breast: negative Hematologic/lymphatic: negative Musculoskeletal:negative Neurological: negative Behavioral/Psych: negative Endocrine: negative Allergic/Immunologic: negative   PHYSICAL  EXAMINATION: General appearance: alert, cooperative, fatigued and no distress Head: Normocephalic, without obvious abnormality, atraumatic Neck: no adenopathy, no JVD, supple, symmetrical, trachea midline and thyroid not enlarged, symmetric, no tenderness/mass/nodules Lymph nodes: Cervical, supraclavicular, and axillary nodes normal. Resp: rales RML and RUL and wheezes RML and RUL Back: symmetric, no curvature. ROM normal. No CVA tenderness. Cardio: regular rate and rhythm, S1, S2 normal, no murmur, click, rub or gallop GI: soft, non-tender; bowel sounds normal; no masses,  no organomegaly Extremities: extremities normal, atraumatic, no cyanosis or edema Neurologic: Alert and oriented X 3, normal strength and tone. Normal symmetric reflexes. Normal coordination and gait  ECOG PERFORMANCE STATUS: 1 - Symptomatic but completely ambulatory  Blood pressure 116/75, pulse 119, temperature 98.2 F (36.8 C), temperature source Oral, resp. rate 17, height 5\' 8"  (1.727 m), weight 165 lb (74.844 kg), SpO2 96.00%.  LABORATORY DATA: Lab Results  Component Value Date   WBC 3.9* 09/19/2014   HGB 12.3* 09/19/2014   HCT 37.2* 09/19/2014   MCV 85.4 09/19/2014   PLT 207 09/19/2014      Chemistry      Component Value Date/Time   NA 139 09/13/2014 1247   NA 142 07/17/2014 0904   K 3.8 09/13/2014 1247   K 4.4 07/17/2014 0904   CL 108 07/17/2014 0904   CO2 24 09/13/2014 1247   CO2 24 07/17/2014 0904   BUN 11.8 09/13/2014 1247   BUN 12 07/17/2014 0904   CREATININE 0.7 09/13/2014 1247   CREATININE 0.9 07/17/2014 0904      Component Value Date/Time   CALCIUM 9.2 09/13/2014 1247   CALCIUM 9.2 07/17/2014 0904   ALKPHOS 84 09/13/2014 1247   AST 11 09/13/2014 1247   ALT 21 09/13/2014 1247   BILITOT 0.48 09/13/2014 1247       RADIOGRAPHIC STUDIES: Dg Eye Foreign Body  08/01/2014   CLINICAL DATA:  Implanted hearing aids.  Pre MRI evaluation.  EXAM: ORBITS FOR FOREIGN BODY - 2 VIEW  COMPARISON:  Sinus CT 07/17/2014   FINDINGS: Evidence for bilateral electronic hearing aid devices. No radiopaque foreign bodies or metallic structures in the region of the orbits. Small metallic structures associated with the maxilla.  IMPRESSION: No evidence of metallic foreign body within the orbits.  Bilateral hearing aid devices.   Electronically Signed   By: Markus Daft M.D.   On: 08/01/2014 11:59   Dg Chest 2 View  07/31/2014   CLINICAL DATA:  Cough.  Short of breath.  Fever.  EXAM: CHEST  2 VIEW  COMPARISON:  PET-CT, 07/26/2014.  Chest radiograph, 07/11/2014.  FINDINGS: Dense consolidation in the right upper lobe reflects postobstructive pneumonitis. Lobe could be secondarily infected. Mild reticular lung base opacity, mostly on the left, is likely subsegmental atelectasis. No other areas of lung consolidation. No pleural effusion.  Known right hilar mass is obscured by the contiguous right upper lobe consolidation. No left hilar mass. Mild thickening along the right peritracheal stripe. Cardiac silhouette is normal in size.  Bony thorax is intact.  IMPRESSION: 1. Dense consolidation in the right upper lobe reflects post obstruction change, likely postobstructive pneumonitis. Secondary pneumonia is possible. The extent of the consolidation is similar to the recent PET-CT. 2. No other areas of lung consolidation. No pulmonary edema. Mild basilar subsegmental atelectasis. No pleural effusion.   Electronically Signed   By: Lajean Manes M.D.   On: 07/31/2014 13:09   Mr Jeri Cos QM Contrast  08/01/2014   CLINICAL DATA:  68 year old male with confusion and recent diagnosis of lung cancer. Staging. Subsequent encounter.  EXAM: MRI HEAD WITHOUT AND WITH CONTRAST  TECHNIQUE: Multiplanar, multiecho pulse sequences of the brain and surrounding structures were obtained without and with intravenous contrast.  CONTRAST:  33mL MULTIHANCE GADOBENATE DIMEGLUMINE 529 MG/ML IV SOLN  COMPARISON:  Paranasal sinus CT 07/17/2014.  FINDINGS: No abnormal  enhancement identified. No midline shift, mass effect, or evidence of intracranial mass lesion.  Susceptibility artifact related to dental hardware, mildly degrades some portions of this exam. Cerebral volume is within normal limits for age. No restricted diffusion to suggest acute infarction. No ventriculomegaly, extra-axial collection or acute intracranial hemorrhage. Cervicomedullary junction and pituitary are within normal limits. Major intracranial vascular flow voids are preserved, dominant distal left vertebral artery. Pearline Cables and white matter signal is within normal limits for age throughout the brain.  Degenerative disc and endplate degeneration at C3-C4 resulting in cervical spinal stenosis, appears to be mild.  Visualized bone marrow signal is within normal limits. Visible internal auditory structures appear normal. Trace inferior right mastoid effusion. Negative visible paranasal sinuses. Postoperative changes to the globes. Visualized scalp soft tissues are within normal limits.  IMPRESSION: No acute or metastatic intracranial abnormality.   Electronically Signed   By: Lars Pinks M.D.   On: 08/01/2014 20:29   Nm Pet Image Initial (pi) Skull Base To Thigh  07/26/2014   CLINICAL DATA:  Initial treatment strategy for lung nodule.  EXAM: NUCLEAR MEDICINE PET SKULL BASE TO THIGH  TECHNIQUE: 8.5 mCi F-18 FDG was injected intravenously. Full-ring PET imaging was performed from the skull base to thigh after the radiotracer. CT data was obtained and used for attenuation correction and anatomic localization.  FASTING BLOOD GLUCOSE:  Value: 151 mg/dl  COMPARISON:  CT chest 07/17/2014.  FINDINGS: NECK  No hypermetabolic lymph nodes in the neck. CT images show no acute findings.  CHEST  A right supraclavicular lymph node measures 9 mm (CT image 42) with an SUV max of 2.8. Mediastinal lymph nodes measure up to 12 mm in the low right paratracheal station with an SUV max of 3.8. Right hilar mass was better measured on  07/17/2014 and has an SUV max of 7.2.  When coronal images from diagnostic examination performed 07/17/2014 are reviewed, the right hilar mass appears to come within 1.7 cm of the carina. Right upper lobe bronchus is completely obstructed with marked progression of postobstructive consolidation and pneumonitis in the right upper lobe, which has an SUV max of 9.2 anteriorly. There is narrowing of the bronchus intermedius and right lower lobe bronchus as well, with new postobstructive pneumonitis in the right lower lobe.  CT images show atherosclerotic calcification of the arterial vasculature, including three-vessel involvement of the coronary arteries. Heart size normal. Tiny amount of pericardial fluid may be physiologic. No pleural fluid. Respiratory motion degrades image quality upon review of the lung windows. There is persistent ground-glass airspace disease in the left lower lobe. Nodular consolidation in the left lower lobe (series 8, image 57) is seen as  well.  ABDOMEN/PELVIS  No abnormal hypermetabolism within the liver, adrenal glands, spleen or pancreas. No hypermetabolic lymph nodes.  CT images show the liver to be grossly unremarkable. A large stone fills the fundus of the gallbladder, measuring approximately 3.6 cm. A 1.6 cm low density nodule in the right adrenal gland is stable. Left adrenal gland is unremarkable. Low-attenuation lesions in the kidneys measure up to 1.5 cm on the right and are difficult to definitively characterize without post-contrast imaging. Tiny stones in the right kidney. Spleen, pancreas, stomach and bowel are grossly unremarkable.  Atherosclerotic calcification of the arterial vasculature without abdominal aortic aneurysm. Small bilateral inguinal hernias contain fat. No free fluid.  SKELETON  No abnormal osseous hypermetabolism.  IMPRESSION: 1. Hypermetabolic right hilar mass with hypermetabolic ipsilateral mediastinal and right supraclavicular lymph nodes, most consistent  with primary bronchogenic carcinoma. Given apparent distance of less than 2 cm from the carina, findings are worrisome for T3 N3 M0 or stage IIIB disease. 2. Complete obstruction of the right upper lobe bronchus with marked narrowing of the bronchus intermedius and right lower lobe bronchus. Progressive postobstructive pneumonitis in the right upper lobe with new postobstructive changes in the right lower lobe. 3. Persistent ground-glass and nodular airspace disease in the left lower lobe, better imaged on 07/17/2014. 4. Tiny amount of pericardial fluid may be physiologic. 5. Three-vessel coronary artery calcification. 6. Right adrenal adenoma. 7. Tiny right renal stones. 8. Small bilateral inguinal hernias contain fat.   Electronically Signed   By: Lorin Picket M.D.   On: 07/26/2014 09:16    ASSESSMENT AND PLAN: This is a very pleasant 68 years old white male recently diagnosed with a stage IIIB non-small cell lung cancer status post a short course of palliative radiotherapy to the obstructing mass in the right upper lobe. He is currently being treated with systemic chemotherapy with carboplatin for AUC of 5 on day 1 and Abraxane 100 mg/M2 on days 1, 8 and 15 every 3 weeks. He is status post day 1 of cycle# 1.The patient was discussed with and also seen by Dr. Julien Nordmann. He will proceed with cycle #2 as scheduled. He will return in 3 weeks for another symptom management visit at the start of cycle #3. Regarding returning to work, he was again advised to see how he tolerates chemotherapy and perhaps to wait until after the first restaging CT scan. He was agreeable to this recommendation. He was advised to call immediately if he has any concerning symptoms in the interval. The patient voices understanding of current disease status and treatment options and is in agreement with the current care plan.  All questions were answered. The patient knows to call the clinic with any problems, questions or concerns. We  can certainly see the patient much sooner if necessary.  Disclaimer: This note was dictated with voice recognition software. Similar sounding words can inadvertently be transcribed and may not be corrected upon review. Carlton Adam, PA-C 09/19/2014  ADDENDUM: Hematology/Oncology Attending: I had a face to face encounter with the patient. I recommended his care plan. This is a very pleasant 68 years old white male with history of stage IIIB non-small cell lung cancer, squamous cell carcinoma. The patient is currently undergoing systemic chemotherapy with carboplatin and Abraxane status post 1 cycle. He tolerated the first cycle of his treatment fairly well except for mild fatigue. He has significant improvement in his breathing.  Will proceed with cycle #2 today as scheduled. The patient would come back  for followup visit in 3 weeks with the next cycle of his treatment. He was advised to call immediately if he has any concerning symptoms in the interval.  Disclaimer: This note was dictated with voice recognition software. Similar sounding words can inadvertently be transcribed and may be missed upon review. Eilleen Kempf., MD 09/23/2014

## 2014-09-20 ENCOUNTER — Ambulatory Visit (HOSPITAL_BASED_OUTPATIENT_CLINIC_OR_DEPARTMENT_OTHER): Payer: BC Managed Care – PPO

## 2014-09-20 ENCOUNTER — Other Ambulatory Visit: Payer: BC Managed Care – PPO

## 2014-09-20 ENCOUNTER — Telehealth: Payer: Self-pay | Admitting: *Deleted

## 2014-09-20 VITALS — BP 106/69 | HR 103 | Temp 98.8°F | Resp 20

## 2014-09-20 DIAGNOSIS — C3411 Malignant neoplasm of upper lobe, right bronchus or lung: Secondary | ICD-10-CM

## 2014-09-20 DIAGNOSIS — Z5111 Encounter for antineoplastic chemotherapy: Secondary | ICD-10-CM

## 2014-09-20 MED ORDER — ONDANSETRON 16 MG/50ML IVPB (CHCC)
16.0000 mg | Freq: Once | INTRAVENOUS | Status: AC
  Administered 2014-09-20: 16 mg via INTRAVENOUS

## 2014-09-20 MED ORDER — SODIUM CHLORIDE 0.9 % IV SOLN: Freq: Once | INTRAVENOUS | Status: DC

## 2014-09-20 MED ORDER — PACLITAXEL PROTEIN-BOUND CHEMO INJECTION 100 MG
100.0000 mg/m2 | Freq: Once | INTRAVENOUS | Status: AC
  Administered 2014-09-20: 200 mg via INTRAVENOUS
  Filled 2014-09-20: qty 40

## 2014-09-20 MED ORDER — DEXAMETHASONE SODIUM PHOSPHATE 20 MG/5ML IJ SOLN
INTRAMUSCULAR | Status: AC
  Filled 2014-09-20: qty 5

## 2014-09-20 MED ORDER — DEXAMETHASONE SODIUM PHOSPHATE 20 MG/5ML IJ SOLN
20.0000 mg | Freq: Once | INTRAMUSCULAR | Status: AC
  Administered 2014-09-20: 20 mg via INTRAVENOUS

## 2014-09-20 MED ORDER — ONDANSETRON 16 MG/50ML IVPB (CHCC)
INTRAVENOUS | Status: AC
  Filled 2014-09-20: qty 16

## 2014-09-20 MED ORDER — CARBOPLATIN CHEMO INJECTION 600 MG/60ML
504.5000 mg | Freq: Once | INTRAVENOUS | Status: AC
  Administered 2014-09-20: 500 mg via INTRAVENOUS
  Filled 2014-09-20: qty 50

## 2014-09-20 NOTE — Patient Instructions (Signed)
[facility] Discharge Instructions for Patients Receiving Chemotherapy  Today you received the following chemotherapy agents: Abraxane and Carboplatin.  To help prevent nausea and vomiting after your treatment, we encourage you to take your nausea medication: Compazine 10mg  every 6 hours.   If you develop nausea and vomiting that is not controlled by your nausea medication, call the clinic.   BELOW ARE SYMPTOMS THAT SHOULD BE REPORTED IMMEDIATELY:  *FEVER GREATER THAN 100.5 F  *CHILLS WITH OR WITHOUT FEVER  NAUSEA AND VOMITING THAT IS NOT CONTROLLED WITH YOUR NAUSEA MEDICATION  *UNUSUAL SHORTNESS OF BREATH  *UNUSUAL BRUISING OR BLEEDING  TENDERNESS IN MOUTH AND THROAT WITH OR WITHOUT PRESENCE OF ULCERS  *URINARY PROBLEMS  *BOWEL PROBLEMS  UNUSUAL RASH Items with * indicate a potential emergency and should be followed up as soon as possible.  Feel free to call the clinic you have any questions or concerns. The clinic phone number is [phone].

## 2014-09-20 NOTE — Telephone Encounter (Signed)
I have adjusted 10/28 appt

## 2014-09-22 NOTE — Patient Instructions (Signed)
Continue labs and chemotherapy as scheduled Follow up in 3 weeks

## 2014-09-25 ENCOUNTER — Encounter: Payer: Self-pay | Admitting: Internal Medicine

## 2014-09-25 ENCOUNTER — Encounter: Payer: Self-pay | Admitting: Radiation Oncology

## 2014-09-25 ENCOUNTER — Ambulatory Visit
Admission: RE | Admit: 2014-09-25 | Discharge: 2014-09-25 | Disposition: A | Discharge status: Home / Self Care | Payer: BC Managed Care – PPO | Source: Ambulatory Visit | Attending: Radiation Oncology | Admitting: Radiation Oncology

## 2014-09-25 ENCOUNTER — Encounter: Payer: Self-pay | Admitting: Oncology

## 2014-09-25 VITALS — BP 109/69 | HR 113 | Temp 97.4°F | Resp 20 | Wt 162.4 lb

## 2014-09-25 DIAGNOSIS — C3411 Malignant neoplasm of upper lobe, right bronchus or lung: Secondary | ICD-10-CM

## 2014-09-25 HISTORY — DX: Reserved for concepts with insufficient information to code with codable children: IMO0002

## 2014-09-25 HISTORY — DX: Reserved for inherently not codable concepts without codable children: IMO0001

## 2014-09-25 MED ORDER — LORAZEPAM 0.5 MG PO TABS: 0.5000 mg | ORAL_TABLET | Freq: Three times a day (TID) | ORAL | Status: DC

## 2014-09-25 NOTE — Progress Notes (Signed)
  Radiation Oncology         [phone] ________________________________  Name: Daryl Vega MRN: [id number]  Date: 09/25/2014  DOB: May 04, 1946  End of Treatment Note  Diagnosis:     Stage IIIB (T3, N3, M0) non-small cell lung cancer, squamous cell carcinoma presenting with right upper lobe obstructing mass as well as mediastinal and right supraclavicular lymphadenopathy diagnosed in August of 2015.    Indication for treatment:  Cough, breathing problems and right upper lobe bronchial obstruction       Radiation treatment dates:   August 20 through September 9  Site/dose:   Right upper central chest 35 gray in 14 fractions  Beams/energy:   3-D conformal therapy  Narrative: The patient tolerated radiation treatment relatively well.   The patient's coughing improved significantly during the course of treatment.  Plan: The patient has completed radiation treatment. The patient will return to radiation oncology clinic for routine followup in one month. I advised them to call or return sooner if they have any questions or concerns related to their recovery or treatment.  -----------------------------------  Blair Promise, PhD, MD

## 2014-09-25 NOTE — Progress Notes (Signed)
Patient for one month follow up completion of radiation for non-small cell lung cancer-squamous cell carcinoma.Denies pain.Has been "sick on stomach and feeling bad"Patient is courrently under going chemotherapy which is due again this Wednesday.Has compazine and lorazepam.requesting refill on lorazepam.No longer coughing up blood.

## 2014-09-25 NOTE — Progress Notes (Signed)
Radiation Oncology         [phone] ________________________________  Name: Daryl Vega MRN: [id number]  Date: 09/25/2014  DOB: [birth year]  Follow-Up Visit Note  CC: [name], MD  Tanda Rockers, MD    ICD-9-CM ICD-10-CM  1. Malignant neoplasm of upper lobe of right lung 162.3 C34.11    Diagnosis:   Stage IIIB (T3, N3, M0) non-small cell lung cancer, squamous cell carcinoma presenting with right upper lobe obstructing mass as well as mediastinal and right supraclavicular lymphadenopathy diagnosed in August of 2015.   Interval Since Last Radiation:  1  months  Narrative:  The patient returns today for routine follow-up.  He completed palliative radiation therapy to the right upper lobe for bronchial obstruction. Patient admits his coughing has improved. He denies any hemoptysis. His breathing has not improved appreciably however. Patient has had some nausea over the past few days related to his chemotherapy. I did refill his Ativan today.                             ALLERGIES:  has No Known Allergies.  Meds: Current Outpatient Prescriptions  Medication Sig Dispense Refill  . atorvastatin (LIPITOR) 80 MG tablet Take 80 mg by mouth daily.      . budesonide-formoterol (SYMBICORT) 160-4.5 MCG/ACT inhaler Take 2 puffs first thing in am and then another 2 puffs about 12 hours later.      . Cholecalciferol (VITAMIN D3 PO) Take 10,000 Units by mouth daily.      . Cinnamon 500 MG capsule Take 500 mg by mouth daily.      . Dapagliflozin Propanediol (FARXIGA) 5 MG TABS Take 5 mg by mouth 2 (two) times daily.      [name] glimepiride (AMARYL) 4 MG tablet Take 4 mg by mouth 2 (two) times daily. Take before meals      . hyaluronate sodium (RADIAPLEXRX) GEL Apply 1 application topically once.      [name] HYDROcodone-acetaminophen (NORCO) 5-325 MG per tablet 1-2 up to every 4 hours for pain or cough  40 tablet  0  . LORazepam (ATIVAN) 0.5 MG tablet Take 1 tablet (0.5 mg total) by mouth every  8 (eight) hours. Called in to [facility], [place] per Dr. [name].  30 tablet  0  . metFORMIN (GLUCOPHAGE) 500 MG tablet Take 500 mg by mouth 2 (two) times daily with a meal.      . omeprazole (PRILOSEC) 20 MG capsule Take 20 mg by mouth daily.      . prochlorperazine (COMPAZINE) 10 MG tablet Take 1 tablet (10 mg total) by mouth every 6 (six) hours as needed for nausea or vomiting.  30 tablet  1  . temazepam (RESTORIL) 15 MG capsule Take 15 mg by mouth at bedtime as needed for sleep.       No current facility-administered medications for this encounter.    Physical Findings: The patient is in no acute distress. Patient is alert and oriented.  weight is 162 lb 6.4 oz (73.664 kg). His temperature is 97.4 F (36.3 C). His blood pressure is 109/69 and his pulse is 113. His respiration is 20 and oxygen saturation is 97%. .  No palpable supraclavicular or axillary adenopathy. The lungs are clear to auscultation. The heart has a regular rhythm and rate  Lab Findings: Lab Results  Component Value Date   WBC 3.9* 09/19/2014   HGB 12.3* 09/19/2014   HCT 37.2*  09/19/2014   MCV 85.4 09/19/2014   PLT 207 09/19/2014    Radiographic Findings: No results found.  Impression:  The patient is recovering from the effects of radiation.  Coughing improved with treatment  Plan:  When necessary followup in radiation oncology. Patient will continue close followup with medical oncology with ongoing chemotherapy.  ____________________________________ Blair Promise, MD

## 2014-09-27 ENCOUNTER — Ambulatory Visit (HOSPITAL_BASED_OUTPATIENT_CLINIC_OR_DEPARTMENT_OTHER): Payer: BC Managed Care – PPO

## 2014-09-27 ENCOUNTER — Other Ambulatory Visit: Payer: Self-pay | Admitting: Nurse Practitioner

## 2014-09-27 ENCOUNTER — Other Ambulatory Visit (HOSPITAL_BASED_OUTPATIENT_CLINIC_OR_DEPARTMENT_OTHER): Payer: BC Managed Care – PPO

## 2014-09-27 VITALS — BP 106/73 | HR 96 | Temp 98.3°F | Resp 19

## 2014-09-27 DIAGNOSIS — C3411 Malignant neoplasm of upper lobe, right bronchus or lung: Secondary | ICD-10-CM

## 2014-09-27 DIAGNOSIS — C3490 Malignant neoplasm of unspecified part of unspecified bronchus or lung: Secondary | ICD-10-CM

## 2014-09-27 DIAGNOSIS — Z5111 Encounter for antineoplastic chemotherapy: Secondary | ICD-10-CM

## 2014-09-27 DIAGNOSIS — C3491 Malignant neoplasm of unspecified part of right bronchus or lung: Secondary | ICD-10-CM

## 2014-09-27 LAB — COMPREHENSIVE METABOLIC PANEL (CC13)
ALK PHOS: 85 U/L (ref 40–150)
ALT: 28 U/L (ref 0–55)
AST: 16 U/L (ref 5–34)
Albumin: 3.4 g/dL — ABNORMAL LOW (ref 3.5–5.0)
Anion Gap: 10 mEq/L (ref 3–11)
BUN: 12.4 mg/dL (ref 7.0–26.0)
CALCIUM: 9.6 mg/dL (ref 8.4–10.4)
CHLORIDE: 108 meq/L (ref 98–109)
CO2: 20 mEq/L — ABNORMAL LOW (ref 22–29)
Creatinine: 0.8 mg/dL (ref 0.7–1.3)
Glucose: 150 mg/dl — ABNORMAL HIGH (ref 70–140)
POTASSIUM: 3.3 meq/L — AB (ref 3.5–5.1)
SODIUM: 138 meq/L (ref 136–145)
Total Bilirubin: 0.67 mg/dL (ref 0.20–1.20)
Total Protein: 6.6 g/dL (ref 6.4–8.3)

## 2014-09-27 LAB — CBC WITH DIFFERENTIAL/PLATELET
BASO%: 0.6 % (ref 0.0–2.0)
BASOS ABS: 0 10*3/uL (ref 0.0–0.1)
EOS%: 0.8 % (ref 0.0–7.0)
Eosinophils Absolute: 0 10*3/uL (ref 0.0–0.5)
HCT: 32.1 % — ABNORMAL LOW (ref 38.4–49.9)
HGB: 11.1 g/dL — ABNORMAL LOW (ref 13.0–17.1)
LYMPH#: 0.9 10*3/uL (ref 0.9–3.3)
LYMPH%: 18.1 % (ref 14.0–49.0)
MCH: 28.5 pg (ref 27.2–33.4)
MCHC: 34.6 g/dL (ref 32.0–36.0)
MCV: 82.5 fL (ref 79.3–98.0)
MONO#: 0.5 10*3/uL (ref 0.1–0.9)
MONO%: 9.5 % (ref 0.0–14.0)
NEUT#: 3.5 10*3/uL (ref 1.5–6.5)
NEUT%: 71 % (ref 39.0–75.0)
Platelets: 144 10*3/uL (ref 140–400)
RBC: 3.89 10*6/uL — AB (ref 4.20–5.82)
RDW: 16.8 % — AB (ref 11.0–14.6)
WBC: 4.9 10*3/uL (ref 4.0–10.3)

## 2014-09-27 LAB — TECHNOLOGIST REVIEW

## 2014-09-27 MED ORDER — DEXAMETHASONE SODIUM PHOSPHATE 10 MG/ML IJ SOLN
INTRAMUSCULAR | Status: AC
  Filled 2014-09-27: qty 1

## 2014-09-27 MED ORDER — PACLITAXEL PROTEIN-BOUND CHEMO INJECTION 100 MG
100.0000 mg/m2 | Freq: Once | INTRAVENOUS | Status: AC
  Administered 2014-09-27: 200 mg via INTRAVENOUS
  Filled 2014-09-27: qty 40

## 2014-09-27 MED ORDER — ONDANSETRON 8 MG/NS 50 ML IVPB
INTRAVENOUS | Status: AC
  Filled 2014-09-27: qty 8

## 2014-09-27 MED ORDER — DEXAMETHASONE SODIUM PHOSPHATE 10 MG/ML IJ SOLN
10.0000 mg | Freq: Once | INTRAMUSCULAR | Status: AC
  Administered 2014-09-27: 10 mg via INTRAVENOUS

## 2014-09-27 MED ORDER — HYDROCODONE-ACETAMINOPHEN 5-325 MG PO TABS
ORAL_TABLET | ORAL | Status: DC
Start: 2014-09-27 — End: 2014-10-11

## 2014-09-27 MED ORDER — SODIUM CHLORIDE 0.9 % IV SOLN
Freq: Once | INTRAVENOUS | Status: AC
Start: 2014-09-27 — End: 2014-09-27
  Administered 2014-09-27: 10:00:00 via INTRAVENOUS

## 2014-09-27 MED ORDER — ONDANSETRON 8 MG/50ML IVPB (CHCC)
8.0000 mg | Freq: Once | INTRAVENOUS | Status: AC
  Administered 2014-09-27: 8 mg via INTRAVENOUS

## 2014-09-27 MED ORDER — PROCHLORPERAZINE MALEATE 10 MG PO TABS: 10.0000 mg | ORAL_TABLET | Freq: Four times a day (QID) | ORAL | Status: DC | PRN

## 2014-09-27 NOTE — Patient Instructions (Signed)
[facility] Discharge Instructions for Patients Receiving Chemotherapy  Today you received the following chemotherapy agents: Abraxane  To help prevent nausea and vomiting after your treatment, we encourage you to take your nausea medication as prescribed by your physician.   If you develop nausea and vomiting that is not controlled by your nausea medication, call the clinic.   BELOW ARE SYMPTOMS THAT SHOULD BE REPORTED IMMEDIATELY:  *FEVER GREATER THAN 100.5 F  *CHILLS WITH OR WITHOUT FEVER  NAUSEA AND VOMITING THAT IS NOT CONTROLLED WITH YOUR NAUSEA MEDICATION  *UNUSUAL SHORTNESS OF BREATH  *UNUSUAL BRUISING OR BLEEDING  TENDERNESS IN MOUTH AND THROAT WITH OR WITHOUT PRESENCE OF ULCERS  *URINARY PROBLEMS  *BOWEL PROBLEMS  UNUSUAL RASH Items with * indicate a potential emergency and should be followed up as soon as possible.  Feel free to call the clinic you have any questions or concerns. The clinic phone number is [phone].

## 2014-10-04 ENCOUNTER — Ambulatory Visit (HOSPITAL_BASED_OUTPATIENT_CLINIC_OR_DEPARTMENT_OTHER): Payer: BC Managed Care – PPO

## 2014-10-04 ENCOUNTER — Other Ambulatory Visit (HOSPITAL_BASED_OUTPATIENT_CLINIC_OR_DEPARTMENT_OTHER): Payer: BC Managed Care – PPO

## 2014-10-04 VITALS — BP 120/73 | HR 100 | Temp 97.5°F | Resp 18

## 2014-10-04 DIAGNOSIS — Z5111 Encounter for antineoplastic chemotherapy: Secondary | ICD-10-CM

## 2014-10-04 DIAGNOSIS — C3411 Malignant neoplasm of upper lobe, right bronchus or lung: Secondary | ICD-10-CM

## 2014-10-04 DIAGNOSIS — C3491 Malignant neoplasm of unspecified part of right bronchus or lung: Secondary | ICD-10-CM

## 2014-10-04 LAB — COMPREHENSIVE METABOLIC PANEL
ALT: 17 U/L (ref 0–53)
AST: 13 U/L (ref 0–37)
Albumin: 3.5 g/dL (ref 3.5–5.2)
Alkaline Phosphatase: 77 U/L (ref 39–117)
BILIRUBIN TOTAL: 0.4 mg/dL (ref 0.3–1.2)
BUN: 11 mg/dL (ref 6–23)
CO2: 23 mEq/L (ref 19–32)
Calcium: 9.1 mg/dL (ref 8.4–10.5)
Chloride: 104 mEq/L (ref 96–112)
Creatinine, Ser: 0.77 mg/dL (ref 0.50–1.35)
Glucose, Bld: 151 mg/dL — ABNORMAL HIGH (ref 70–99)
Potassium: 3.8 mEq/L (ref 3.5–5.3)
Sodium: 139 mEq/L (ref 135–145)
Total Protein: 6.5 g/dL (ref 6.0–8.3)

## 2014-10-04 LAB — CBC WITH DIFFERENTIAL/PLATELET
BASO%: 1 % (ref 0.0–2.0)
BASOS ABS: 0 10*3/uL (ref 0.0–0.1)
EOS%: 1 % (ref 0.0–7.0)
Eosinophils Absolute: 0 10*3/uL (ref 0.0–0.5)
HEMATOCRIT: 29.4 % — AB (ref 38.4–49.9)
HEMOGLOBIN: 9.9 g/dL — AB (ref 13.0–17.1)
LYMPH#: 1 10*3/uL (ref 0.9–3.3)
LYMPH%: 24.8 % (ref 14.0–49.0)
MCH: 28.7 pg (ref 27.2–33.4)
MCHC: 33.7 g/dL (ref 32.0–36.0)
MCV: 85.2 fL (ref 79.3–98.0)
MONO#: 0.4 10*3/uL (ref 0.1–0.9)
MONO%: 9.8 % (ref 0.0–14.0)
NEUT#: 2.6 10*3/uL (ref 1.5–6.5)
NEUT%: 63.4 % (ref 39.0–75.0)
Platelets: 182 10*3/uL (ref 140–400)
RBC: 3.45 10*6/uL — ABNORMAL LOW (ref 4.20–5.82)
RDW: 18 % — ABNORMAL HIGH (ref 11.0–14.6)
WBC: 4.1 10*3/uL (ref 4.0–10.3)
nRBC: 3 % — ABNORMAL HIGH (ref 0–0)

## 2014-10-04 LAB — TECHNOLOGIST REVIEW

## 2014-10-04 MED ORDER — SODIUM CHLORIDE 0.9 % IV SOLN
Freq: Once | INTRAVENOUS | Status: AC
  Administered 2014-10-04: 10:00:00 via INTRAVENOUS

## 2014-10-04 MED ORDER — ONDANSETRON 8 MG/50ML IVPB (CHCC)
8.0000 mg | Freq: Once | INTRAVENOUS | Status: AC
  Administered 2014-10-04: 8 mg via INTRAVENOUS

## 2014-10-04 MED ORDER — DEXAMETHASONE SODIUM PHOSPHATE 10 MG/ML IJ SOLN
10.0000 mg | Freq: Once | INTRAMUSCULAR | Status: AC
  Administered 2014-10-04: 10 mg via INTRAVENOUS

## 2014-10-04 MED ORDER — DEXAMETHASONE SODIUM PHOSPHATE 10 MG/ML IJ SOLN
INTRAMUSCULAR | Status: AC
  Filled 2014-10-04: qty 1

## 2014-10-04 MED ORDER — PACLITAXEL PROTEIN-BOUND CHEMO INJECTION 100 MG
100.0000 mg/m2 | Freq: Once | INTRAVENOUS | Status: AC
  Administered 2014-10-04: 200 mg via INTRAVENOUS
  Filled 2014-10-04: qty 40

## 2014-10-04 MED ORDER — ONDANSETRON 8 MG/NS 50 ML IVPB
INTRAVENOUS | Status: AC
  Filled 2014-10-04: qty 8

## 2014-10-04 NOTE — Patient Instructions (Signed)
[facility] Discharge Instructions for Patients Receiving Chemotherapy  Today you received the following chemotherapy agents: Abraxane  To help prevent nausea and vomiting after your treatment, we encourage you to take your nausea medication as prescribed by your physician.   If you develop nausea and vomiting that is not controlled by your nausea medication, call the clinic.   BELOW ARE SYMPTOMS THAT SHOULD BE REPORTED IMMEDIATELY:  *FEVER GREATER THAN 100.5 F  *CHILLS WITH OR WITHOUT FEVER  NAUSEA AND VOMITING THAT IS NOT CONTROLLED WITH YOUR NAUSEA MEDICATION  *UNUSUAL SHORTNESS OF BREATH  *UNUSUAL BRUISING OR BLEEDING  TENDERNESS IN MOUTH AND THROAT WITH OR WITHOUT PRESENCE OF ULCERS  *URINARY PROBLEMS  *BOWEL PROBLEMS  UNUSUAL RASH Items with * indicate a potential emergency and should be followed up as soon as possible.  Feel free to call the clinic you have any questions or concerns. The clinic phone number is [phone].

## 2014-10-10 ENCOUNTER — Telehealth: Payer: Self-pay | Admitting: Internal Medicine

## 2014-10-10 NOTE — Telephone Encounter (Addendum)
Spoke with Daryl Vega.  States that [facility] never received the appeal that was faxed on 09/06/14--Laurie states that she spoke with [facility] on 09/11/14 and was advised that this was faxed.  Daryl Vega requests that we just re-fax her the appeal paperwork and she will ensure that someone with [employer] receives this.  This has been refaxed.  Fax # [phone] ATTN: Daryl Vega Nothing further needed.

## 2014-10-11 ENCOUNTER — Ambulatory Visit (HOSPITAL_BASED_OUTPATIENT_CLINIC_OR_DEPARTMENT_OTHER): Payer: BC Managed Care – PPO

## 2014-10-11 ENCOUNTER — Telehealth: Payer: Self-pay | Admitting: Internal Medicine

## 2014-10-11 ENCOUNTER — Ambulatory Visit (HOSPITAL_BASED_OUTPATIENT_CLINIC_OR_DEPARTMENT_OTHER): Payer: BC Managed Care – PPO | Admitting: Physician Assistant

## 2014-10-11 ENCOUNTER — Other Ambulatory Visit: Payer: BC Managed Care – PPO

## 2014-10-11 ENCOUNTER — Other Ambulatory Visit (HOSPITAL_BASED_OUTPATIENT_CLINIC_OR_DEPARTMENT_OTHER): Payer: BC Managed Care – PPO

## 2014-10-11 VITALS — BP 124/62 | HR 103 | Temp 98.3°F | Resp 18 | Ht 68.0 in | Wt 170.9 lb

## 2014-10-11 DIAGNOSIS — C3490 Malignant neoplasm of unspecified part of unspecified bronchus or lung: Secondary | ICD-10-CM

## 2014-10-11 DIAGNOSIS — R5383 Other fatigue: Secondary | ICD-10-CM

## 2014-10-11 DIAGNOSIS — C3411 Malignant neoplasm of upper lobe, right bronchus or lung: Secondary | ICD-10-CM

## 2014-10-11 DIAGNOSIS — Z5111 Encounter for antineoplastic chemotherapy: Secondary | ICD-10-CM

## 2014-10-11 DIAGNOSIS — R0602 Shortness of breath: Secondary | ICD-10-CM

## 2014-10-11 DIAGNOSIS — R918 Other nonspecific abnormal finding of lung field: Secondary | ICD-10-CM

## 2014-10-11 LAB — COMPREHENSIVE METABOLIC PANEL (CC13)
ALBUMIN: 3.3 g/dL — AB (ref 3.5–5.0)
ALK PHOS: 74 U/L (ref 40–150)
ALT: 18 U/L (ref 0–55)
AST: 10 U/L (ref 5–34)
Anion Gap: 8 mEq/L (ref 3–11)
BILIRUBIN TOTAL: 0.47 mg/dL (ref 0.20–1.20)
BUN: 12.3 mg/dL (ref 7.0–26.0)
CO2: 21 mEq/L — ABNORMAL LOW (ref 22–29)
Calcium: 9 mg/dL (ref 8.4–10.4)
Chloride: 110 mEq/L — ABNORMAL HIGH (ref 98–109)
Creatinine: 0.8 mg/dL (ref 0.7–1.3)
Glucose: 263 mg/dl — ABNORMAL HIGH (ref 70–140)
POTASSIUM: 3.7 meq/L (ref 3.5–5.1)
Sodium: 139 mEq/L (ref 136–145)
TOTAL PROTEIN: 6.1 g/dL — AB (ref 6.4–8.3)

## 2014-10-11 LAB — CBC WITH DIFFERENTIAL/PLATELET
BASO%: 1.2 % (ref 0.0–2.0)
BASOS ABS: 0.1 10*3/uL (ref 0.0–0.1)
EOS%: 1.1 % (ref 0.0–7.0)
Eosinophils Absolute: 0 10*3/uL (ref 0.0–0.5)
HEMATOCRIT: 29 % — AB (ref 38.4–49.9)
HEMOGLOBIN: 9.9 g/dL — AB (ref 13.0–17.1)
LYMPH%: 23.4 % (ref 14.0–49.0)
MCH: 29.8 pg (ref 27.2–33.4)
MCHC: 34.1 g/dL (ref 32.0–36.0)
MCV: 87.2 fL (ref 79.3–98.0)
MONO#: 0.4 10*3/uL (ref 0.1–0.9)
MONO%: 9.9 % (ref 0.0–14.0)
NEUT#: 2.6 10*3/uL (ref 1.5–6.5)
NEUT%: 64.4 % (ref 39.0–75.0)
Platelets: 201 10*3/uL (ref 140–400)
RBC: 3.33 10*6/uL — ABNORMAL LOW (ref 4.20–5.82)
RDW: 20.1 % — ABNORMAL HIGH (ref 11.0–14.6)
WBC: 4.1 10*3/uL (ref 4.0–10.3)
lymph#: 0.9 10*3/uL (ref 0.9–3.3)

## 2014-10-11 LAB — TECHNOLOGIST REVIEW

## 2014-10-11 MED ORDER — LORAZEPAM 0.5 MG PO TABS: 0.5000 mg | ORAL_TABLET | Freq: Three times a day (TID) | ORAL | Status: DC | PRN

## 2014-10-11 MED ORDER — ONDANSETRON 16 MG/50ML IVPB (CHCC)
16.0000 mg | Freq: Once | INTRAVENOUS | Status: AC
  Administered 2014-10-11: 16 mg via INTRAVENOUS

## 2014-10-11 MED ORDER — ALTEPLASE 2 MG IJ SOLR
2.0000 mg | Freq: Once | INTRAMUSCULAR | Status: DC | PRN
  Filled 2014-10-11: qty 2

## 2014-10-11 MED ORDER — HEPARIN SOD (PORK) LOCK FLUSH 100 UNIT/ML IV SOLN
250.0000 [IU] | Freq: Once | INTRAVENOUS | Status: DC | PRN
  Filled 2014-10-11: qty 5

## 2014-10-11 MED ORDER — HYDROCODONE-ACETAMINOPHEN 5-325 MG PO TABS: ORAL_TABLET | ORAL | Status: DC

## 2014-10-11 MED ORDER — DEXAMETHASONE SODIUM PHOSPHATE 20 MG/5ML IJ SOLN
INTRAMUSCULAR | Status: AC
  Filled 2014-10-11: qty 5

## 2014-10-11 MED ORDER — PACLITAXEL PROTEIN-BOUND CHEMO INJECTION 100 MG
100.0000 mg/m2 | Freq: Once | INTRAVENOUS | Status: AC
  Administered 2014-10-11: 200 mg via INTRAVENOUS
  Filled 2014-10-11: qty 40

## 2014-10-11 MED ORDER — SODIUM CHLORIDE 0.9 % IV SOLN
Freq: Once | INTRAVENOUS | Status: AC
  Administered 2014-10-11: 13:00:00 via INTRAVENOUS

## 2014-10-11 MED ORDER — ONDANSETRON 16 MG/50ML IVPB (CHCC)
INTRAVENOUS | Status: AC
  Filled 2014-10-11: qty 16

## 2014-10-11 MED ORDER — SODIUM CHLORIDE 0.9 % IV SOLN
500.0000 mg | Freq: Once | INTRAVENOUS | Status: AC
  Administered 2014-10-11: 500 mg via INTRAVENOUS
  Filled 2014-10-11: qty 50

## 2014-10-11 MED ORDER — SODIUM CHLORIDE 0.9 % IJ SOLN
3.0000 mL | INTRAMUSCULAR | Status: DC | PRN
Start: 2014-10-11 — End: 2014-10-11
  Filled 2014-10-11: qty 10

## 2014-10-11 MED ORDER — HEPARIN SOD (PORK) LOCK FLUSH 100 UNIT/ML IV SOLN
500.0000 [IU] | Freq: Once | INTRAVENOUS | Status: DC | PRN
  Filled 2014-10-11: qty 5

## 2014-10-11 MED ORDER — DEXAMETHASONE SODIUM PHOSPHATE 20 MG/5ML IJ SOLN
20.0000 mg | Freq: Once | INTRAMUSCULAR | Status: AC
  Administered 2014-10-11: 20 mg via INTRAVENOUS

## 2014-10-11 MED ORDER — SODIUM CHLORIDE 0.9 % IJ SOLN
10.0000 mL | INTRAMUSCULAR | Status: DC | PRN
  Filled 2014-10-11: qty 10

## 2014-10-11 NOTE — Patient Instructions (Signed)
[facility] Discharge Instructions for Patients Receiving Chemotherapy  Today you received the following chemotherapy agents Carboplatin, Abraxane  To help prevent nausea and vomiting after your treatment, we encourage you to take your nausea medication as directed.   If you develop nausea and vomiting that is not controlled by your nausea medication, call the clinic.   BELOW ARE SYMPTOMS THAT SHOULD BE REPORTED IMMEDIATELY:  *FEVER GREATER THAN 100.5 F  *CHILLS WITH OR WITHOUT FEVER  NAUSEA AND VOMITING THAT IS NOT CONTROLLED WITH YOUR NAUSEA MEDICATION  *UNUSUAL SHORTNESS OF BREATH  *UNUSUAL BRUISING OR BLEEDING  TENDERNESS IN MOUTH AND THROAT WITH OR WITHOUT PRESENCE OF ULCERS  *URINARY PROBLEMS  *BOWEL PROBLEMS  UNUSUAL RASH Items with * indicate a potential emergency and should be followed up as soon as possible.  Feel free to call the clinic you have any questions or concerns. The clinic phone number is [phone].

## 2014-10-11 NOTE — Telephone Encounter (Addendum)
I spoke with Daryl Vega and she states the issue is that form is blank that was faxed to her and [employer] for the appeal and that is why the appeal was not processed. She states all sections need to be completed and there needs to be a letter stating why the Bronch was medically necessary, state that without the bronch they would not have know the pt had cancer. I have printed a copy of the form that we have in the pt chart and am working on completing the form. There are several questions that I am unable to answer and will need to call billing in the AM. I have the form with me and will follow-up on this. [place] Bing, CMA

## 2014-10-11 NOTE — Progress Notes (Addendum)
[facility] [phone]   [phone]  SHARED VISIT PROGRESS NOTE  [name], MD [place] [address]  DIAGNOSIS: Stage IIIB (T3, N3, M0) non-small cell lung cancer, squamous cell carcinoma presenting with right upper lobe obstructing mass as well as mediastinal and right supraclavicular lymphadenopathy diagnosed in August of 2015.  PRIOR THERAPY: status post palliative radiotherapy to the obstructing lung mass in the right upper lobe under the care of Dr. [name]  completed on 08/23/2014.  CURRENT THERAPY: 1)  systemic chemotherapy with carboplatin for AUC of 5 on day 1 and Abraxane 100 mg/M2 on days 1, 8 and 15 every 3 weeks. First dose 08/30/2014. Status post 2 cycles.  INTERVAL HISTORY: Daryl Vega 68 y.o. male returns to the clinic today for followup visit unaccompanied.  He is status post 2 cycles of his systemic chemotherapy with carboplatin and Abraxane.[name] He reports constipation that is well managed with Hardin Negus capsules. He presents to proceed with cycle #3. He also reports some fatigue and generalized malaise. His appetite has improved slightly. He continues to have shortness of breath at baseline and increased with exertion. He requests refill prescriptions for his hydrocodone tablets and Ativan. He denied having any significant chest pain. He has no fever or chills, no nausea or vomiting. 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  He has noticed a significant improvement in his breathing.   MEDICAL HISTORY: Past Medical History  Diagnosis Date  . DM (diabetes mellitus)   . Pneumonia   . Hypercholesteremia   . Melanoma   . Radiation 08/03/14-08/23/14    36 gray to right chest    ALLERGIES:  has No Known Allergies.  MEDICATIONS:  Current Outpatient Prescriptions  Medication Sig Dispense Refill  . atorvastatin (LIPITOR) 80 MG tablet Take 80 mg by  mouth daily.      . budesonide-formoterol (SYMBICORT) 160-4.5 MCG/ACT inhaler Take 2 puffs first thing in am and then another 2 puffs about 12 hours later.      . Cholecalciferol (VITAMIN D3 PO) Take 10,000 Units by mouth daily.      . Cinnamon 500 MG capsule Take 500 mg by mouth daily.      . Dapagliflozin Propanediol (FARXIGA) 5 MG TABS Take 5 mg by mouth 2 (two) times daily.      [name] glimepiride (AMARYL) 4 MG tablet Take 4 mg by mouth 2 (two) times daily. Take before meals      . hyaluronate sodium (RADIAPLEXRX) GEL Apply 1 application topically once.      [name] HYDROcodone-acetaminophen (NORCO) 5-325 MG per tablet 1-2 up to every 4 hours for pain or cough  40 tablet  0  . LORazepam (ATIVAN) 0.5 MG tablet Take 1 tablet (0.5 mg total) by mouth every 8 (eight) hours as needed for anxiety.  30 tablet  0  . metFORMIN (GLUCOPHAGE) 500 MG tablet Take 500 mg by mouth 2 (two) times daily with a meal.      . prochlorperazine (COMPAZINE) 10 MG tablet Take 1 tablet (10 mg total) by mouth every 6 (six) hours as needed for nausea or vomiting.  30 tablet  1  . temazepam (RESTORIL) 15 MG capsule Take 15 mg by mouth at bedtime as needed for sleep.      [name] omeprazole (PRILOSEC) 20 MG capsule Take 20 mg by mouth daily.       No  current facility-administered medications for this visit.   Facility-Administered Medications Ordered in Other Visits  Medication Dose Route Frequency Provider Last Rate Last Dose  . alteplase (CATHFLO ACTIVASE) injection 2 mg  2 mg Intracatheter Once PRN Carlton Adam, PA-C      . heparin lock flush 100 unit/mL  500 Units Intracatheter Once PRN Waldron Gerry E Dell Hurtubise, PA-C      . heparin lock flush 100 unit/mL  250 Units Intracatheter Once PRN Derico Mitton E Willine Schwalbe, PA-C      . sodium chloride 0.9 % injection 10 mL  10 mL Intracatheter PRN Oree Hislop E Amire Gossen, PA-C      . sodium chloride 0.9 % injection 3 mL  3 mL Intravenous PRN Carlton Adam, PA-C        SURGICAL HISTORY:  Past Surgical  History  Procedure Laterality Date  . Video bronchoscopy Bilateral 07/20/2014    Procedure: VIDEO BRONCHOSCOPY WITHOUT FLUORO;  Surgeon: Tanda Rockers, MD;  Location: [facility];  Service: Cardiopulmonary;  Laterality: Bilateral;    REVIEW OF SYSTEMS:  Constitutional: positive for fatigue and malaise Eyes: negative Ears, nose, mouth, throat, and face: negative Respiratory: positive for cough and dyspnea on exertion Cardiovascular: negative Gastrointestinal: positive for constipation Genitourinary:negative Integument/breast: negative Hematologic/lymphatic: negative Musculoskeletal:negative Neurological: negative Behavioral/Psych: negative Endocrine: negative Allergic/Immunologic: negative   PHYSICAL EXAMINATION: General appearance: alert, cooperative, fatigued and no distress Head: Normocephalic, without obvious abnormality, atraumatic Neck: no adenopathy, no JVD, supple, symmetrical, trachea midline and thyroid not enlarged, symmetric, no tenderness/mass/nodules Lymph nodes: Cervical, supraclavicular, and axillary nodes normal. Resp: rales RML and RUL and wheezes RML and RUL Back: symmetric, no curvature. ROM normal. No CVA tenderness. Cardio: regular rate and rhythm, S1, S2 normal, no murmur, click, rub or gallop GI: soft, non-tender; bowel sounds normal; no masses,  no organomegaly Extremities: extremities normal, atraumatic, no cyanosis or edema Neurologic: Alert and oriented X 3, normal strength and tone. Normal symmetric reflexes. Normal coordination and gait  ECOG PERFORMANCE STATUS: 1 - Symptomatic but completely ambulatory  Blood pressure 124/62, pulse 103, temperature 98.3 F (36.8 C), temperature source Oral, resp. rate 18, height 5\' 8"  (1.727 m), weight 170 lb 14.4 oz (77.52 kg), SpO2 98.00%.  LABORATORY DATA: Lab Results  Component Value Date   WBC 4.1 10/11/2014   HGB 9.9* 10/11/2014   HCT 29.0* 10/11/2014   MCV 87.2 10/11/2014   PLT 201 10/11/2014       Chemistry      Component Value Date/Time   NA 139 10/11/2014 0937   NA 139 10/04/2014 0904   K 3.7 10/11/2014 0937   K 3.8 10/04/2014 0904   CL 104 10/04/2014 0904   CO2 21* 10/11/2014 0937   CO2 23 10/04/2014 0904   BUN 12.3 10/11/2014 0937   BUN 11 10/04/2014 0904   CREATININE 0.8 10/11/2014 0937   CREATININE 0.77 10/04/2014 0904      Component Value Date/Time   CALCIUM 9.0 10/11/2014 0937   CALCIUM 9.1 10/04/2014 0904   ALKPHOS 74 10/11/2014 0937   ALKPHOS 77 10/04/2014 0904   AST 10 10/11/2014 0937   AST 13 10/04/2014 0904   ALT 18 10/11/2014 0937   ALT 17 10/04/2014 0904   BILITOT 0.47 10/11/2014 0937   BILITOT 0.4 10/04/2014 0904       RADIOGRAPHIC STUDIES: Dg Eye Foreign Body  08/01/2014   CLINICAL DATA:  Implanted hearing aids.  Pre MRI evaluation.  EXAM: ORBITS FOR FOREIGN BODY - 2 VIEW  COMPARISON:  Sinus  CT 07/17/2014  FINDINGS: Evidence for bilateral electronic hearing aid devices. No radiopaque foreign bodies or metallic structures in the region of the orbits. Small metallic structures associated with the maxilla.  IMPRESSION: No evidence of metallic foreign body within the orbits.  Bilateral hearing aid devices.   Electronically Signed   By: Markus Daft M.D.   On: 08/01/2014 11:59   Dg Chest 2 View  07/31/2014   CLINICAL DATA:  Cough.  Short of breath.  Fever.  EXAM: CHEST  2 VIEW  COMPARISON:  PET-CT, 07/26/2014.  Chest radiograph, 07/11/2014.  FINDINGS: Dense consolidation in the right upper lobe reflects postobstructive pneumonitis. Lobe could be secondarily infected. Mild reticular lung base opacity, mostly on the left, is likely subsegmental atelectasis. No other areas of lung consolidation. No pleural effusion.  Known right hilar mass is obscured by the contiguous right upper lobe consolidation. No left hilar mass. Mild thickening along the right peritracheal stripe. Cardiac silhouette is normal in size.  Bony thorax is intact.  IMPRESSION: 1. Dense consolidation  in the right upper lobe reflects post obstruction change, likely postobstructive pneumonitis. Secondary pneumonia is possible. The extent of the consolidation is similar to the recent PET-CT. 2. No other areas of lung consolidation. No pulmonary edema. Mild basilar subsegmental atelectasis. No pleural effusion.   Electronically Signed   By: Lajean Manes M.D.   On: 07/31/2014 13:09   Mr Daryl Vega [place] Contrast  08/01/2014   CLINICAL DATA:  68 year old male with confusion and recent diagnosis of lung cancer. Staging. Subsequent encounter.  EXAM: MRI HEAD WITHOUT AND WITH CONTRAST  TECHNIQUE: Multiplanar, multiecho pulse sequences of the brain and surrounding structures were obtained without and with intravenous contrast.  CONTRAST:  60mL MULTIHANCE GADOBENATE DIMEGLUMINE 529 MG/ML IV SOLN  COMPARISON:  Paranasal sinus CT 07/17/2014.  FINDINGS: No abnormal enhancement identified. No midline shift, mass effect, or evidence of intracranial mass lesion.  Susceptibility artifact related to dental hardware, mildly degrades some portions of this exam. Cerebral volume is within normal limits for age. No restricted diffusion to suggest acute infarction. No ventriculomegaly, extra-axial collection or acute intracranial hemorrhage. Cervicomedullary junction and pituitary are within normal limits. Major intracranial vascular flow voids are preserved, dominant distal left vertebral artery. Pearline Cables and white matter signal is within normal limits for age throughout the brain.  Degenerative disc and endplate degeneration at C3-C4 resulting in cervical spinal stenosis, appears to be mild.  Visualized bone marrow signal is within normal limits. Visible internal auditory structures appear normal. Trace inferior right mastoid effusion. Negative visible paranasal sinuses. Postoperative changes to the globes. Visualized scalp soft tissues are within normal limits.  IMPRESSION: No acute or metastatic intracranial abnormality.   Electronically  Signed   By: Lars Pinks M.D.   On: 08/01/2014 20:29   Nm Pet Image Initial (pi) Skull Base To Thigh  07/26/2014   CLINICAL DATA:  Initial treatment strategy for lung nodule.  EXAM: NUCLEAR MEDICINE PET SKULL BASE TO THIGH  TECHNIQUE: 8.5 mCi F-18 FDG was injected intravenously. Full-ring PET imaging was performed from the skull base to thigh after the radiotracer. CT data was obtained and used for attenuation correction and anatomic localization.  FASTING BLOOD GLUCOSE:  Value: 151 mg/dl  COMPARISON:  CT chest 07/17/2014.  FINDINGS: NECK  No hypermetabolic lymph nodes in the neck. CT images show no acute findings.  CHEST  A right supraclavicular lymph node measures 9 mm (CT image 42) with an SUV max of 2.8. Mediastinal lymph nodes  measure up to 12 mm in the low right paratracheal station with an SUV max of 3.8. Right hilar mass was better measured on 07/17/2014 and has an SUV max of 7.2.  When coronal images from diagnostic examination performed 07/17/2014 are reviewed, the right hilar mass appears to come within 1.7 cm of the carina. Right upper lobe bronchus is completely obstructed with marked progression of postobstructive consolidation and pneumonitis in the right upper lobe, which has an SUV max of 9.2 anteriorly. There is narrowing of the bronchus intermedius and right lower lobe bronchus as well, with new postobstructive pneumonitis in the right lower lobe.  CT images show atherosclerotic calcification of the arterial vasculature, including three-vessel involvement of the coronary arteries. Heart size normal. Tiny amount of pericardial fluid may be physiologic. No pleural fluid. Respiratory motion degrades image quality upon review of the lung windows. There is persistent ground-glass airspace disease in the left lower lobe. Nodular consolidation in the left lower lobe (series 8, image 57) is seen as well.  ABDOMEN/PELVIS  No abnormal hypermetabolism within the liver, adrenal glands, spleen or pancreas. No  hypermetabolic lymph nodes.  CT images show the liver to be grossly unremarkable. A large stone fills the fundus of the gallbladder, measuring approximately 3.6 cm. A 1.6 cm low density nodule in the right adrenal gland is stable. Left adrenal gland is unremarkable. Low-attenuation lesions in the kidneys measure up to 1.5 cm on the right and are difficult to definitively characterize without post-contrast imaging. Tiny stones in the right kidney. Spleen, pancreas, stomach and bowel are grossly unremarkable.  Atherosclerotic calcification of the arterial vasculature without abdominal aortic aneurysm. Small bilateral inguinal hernias contain fat. No free fluid.  SKELETON  No abnormal osseous hypermetabolism.  IMPRESSION: 1. Hypermetabolic right hilar mass with hypermetabolic ipsilateral mediastinal and right supraclavicular lymph nodes, most consistent with primary bronchogenic carcinoma. Given apparent distance of less than 2 cm from the carina, findings are worrisome for T3 N3 M0 or stage IIIB disease. 2. Complete obstruction of the right upper lobe bronchus with marked narrowing of the bronchus intermedius and right lower lobe bronchus. Progressive postobstructive pneumonitis in the right upper lobe with new postobstructive changes in the right lower lobe. 3. Persistent ground-glass and nodular airspace disease in the left lower lobe, better imaged on 07/17/2014. 4. Tiny amount of pericardial fluid may be physiologic. 5. Three-vessel coronary artery calcification. 6. Right adrenal adenoma. 7. Tiny right renal stones. 8. Small bilateral inguinal hernias contain fat.   Electronically Signed   By: Lorin Picket M.D.   On: 07/26/2014 09:16    ASSESSMENT AND PLAN: This is a very pleasant 68 years old white male recently diagnosed with a stage IIIB non-small cell lung cancer status post a short course of palliative radiotherapy to the obstructing mass in the right upper lobe. He is currently being treated with  systemic chemotherapy with carboplatin for AUC of 5 on day 1 and Abraxane 100 mg/M2 on days 1, 8 and 15 every 3 weeks. He is status post 2 cycles.The patient was discussed with and also seen by Dr. Julien Nordmann. He will proceed with cycle #3 as scheduled. He will return in 3 weeks for another symptom management visit at the start of cycle #4 with a restaging CT of the chest with contrast to reevaluate his disease. Regarding returning to work, he was again advised to see how he tolerates chemotherapy and perhaps to wait until after the first restaging CT scan. He was agreeable to this  recommendation. He was advised to call immediately if he has any concerning symptoms in the interval. The patient voices understanding of current disease status and treatment options and is in agreement with the current care plan.  All questions were answered. The patient knows to call the clinic with any problems, questions or concerns. We can certainly see the patient much sooner if necessary.  Disclaimer: This note was dictated with voice recognition software. Similar sounding words can inadvertently be transcribed and may not be corrected upon review.  Carlton Adam, PA-C 10/11/2014  ADDENDUM: Hematology/Oncology Attending: I had a face to face encounter with the patient. I recommended his care plan.  This is a very pleasant 68 years old white male with history of stage IIIB non-small cell lung cancer status post short course of palliative radiotherapy and currently undergoing systemic chemotherapy with carboplatin and Abraxane status post 2 cycles. The patient is rating his treatment well except for mild fatigue. He denied having any significant nausea or vomiting. He denied having any chest pain but continues to have shortness breath with exertion. I recommended for the patient to proceed with cycle #3 today as scheduled. He would come back for followup visit in 3 weeks with the start of cycle #4 after repeating CT  scan of the chest for restaging of his disease. He was advised to call immediately if he has any concerning symptoms in the interval.  Disclaimer: This note was dictated with voice recognition software. Similar sounding words can inadvertently be transcribed and may be missed upon review. Eilleen Kempf., MD 10/15/2014

## 2014-10-12 NOTE — Telephone Encounter (Signed)
This has been given to Maryann Conners to follow-up on. Per TD close phone note.[place] Bing, CMA

## 2014-10-12 NOTE — Telephone Encounter (Signed)
Maryann Conners spoke with me directly regarding this patient. I have the number of [employer] for Inpatient [phone]. This is the person that Cecille Rubin will need to speak with about the billing concerns and any possible appeals needed. This is out of our hands now. All information needed for this should come from [employer].   Cecille Rubin has been told the above information and given Terrial Rhodes number. Nothing more needed from our office.

## 2014-10-14 NOTE — Patient Instructions (Signed)
Continue labs and chemotherapy is scheduled Follow-up in 3 weeks with restaging CT scan of the chest to reevaluate your disease

## 2014-10-17 ENCOUNTER — Telehealth: Payer: Self-pay | Admitting: Internal Medicine

## 2014-10-17 MED ORDER — BUDESONIDE-FORMOTEROL FUMARATE 160-4.5 MCG/ACT IN AERO
INHALATION_SPRAY | RESPIRATORY_TRACT | Status: DC
Start: 2014-10-17 — End: 2015-01-11

## 2014-10-17 NOTE — Telephone Encounter (Signed)
Called pt. He needed refill on his symbicort. I have done so. Nothing further needed

## 2014-10-18 ENCOUNTER — Ambulatory Visit (HOSPITAL_BASED_OUTPATIENT_CLINIC_OR_DEPARTMENT_OTHER): Payer: BC Managed Care – PPO

## 2014-10-18 ENCOUNTER — Other Ambulatory Visit (HOSPITAL_BASED_OUTPATIENT_CLINIC_OR_DEPARTMENT_OTHER): Payer: BC Managed Care – PPO

## 2014-10-18 DIAGNOSIS — C3411 Malignant neoplasm of upper lobe, right bronchus or lung: Secondary | ICD-10-CM

## 2014-10-18 DIAGNOSIS — Z5111 Encounter for antineoplastic chemotherapy: Secondary | ICD-10-CM

## 2014-10-18 DIAGNOSIS — C3491 Malignant neoplasm of unspecified part of right bronchus or lung: Secondary | ICD-10-CM

## 2014-10-18 LAB — COMPREHENSIVE METABOLIC PANEL (CC13)
ALBUMIN: 3.3 g/dL — AB (ref 3.5–5.0)
ALK PHOS: 85 U/L (ref 40–150)
ALT: 13 U/L (ref 0–55)
AST: 13 U/L (ref 5–34)
Anion Gap: 12 mEq/L — ABNORMAL HIGH (ref 3–11)
BILIRUBIN TOTAL: 0.49 mg/dL (ref 0.20–1.20)
BUN: 10.5 mg/dL (ref 7.0–26.0)
CO2: 18 mEq/L — ABNORMAL LOW (ref 22–29)
Calcium: 9.2 mg/dL (ref 8.4–10.4)
Chloride: 110 mEq/L — ABNORMAL HIGH (ref 98–109)
Creatinine: 0.9 mg/dL (ref 0.7–1.3)
GLUCOSE: 193 mg/dL — AB (ref 70–140)
POTASSIUM: 3.5 meq/L (ref 3.5–5.1)
SODIUM: 140 meq/L (ref 136–145)
TOTAL PROTEIN: 6.2 g/dL — AB (ref 6.4–8.3)

## 2014-10-18 LAB — CBC WITH DIFFERENTIAL/PLATELET
BASO%: 0.9 % (ref 0.0–2.0)
Basophils Absolute: 0 10*3/uL (ref 0.0–0.1)
EOS ABS: 0 10*3/uL (ref 0.0–0.5)
EOS%: 0.8 % (ref 0.0–7.0)
HCT: 27.4 % — ABNORMAL LOW (ref 38.4–49.9)
HGB: 9.3 g/dL — ABNORMAL LOW (ref 13.0–17.1)
LYMPH%: 22.7 % (ref 14.0–49.0)
MCH: 29.8 pg (ref 27.2–33.4)
MCHC: 34 g/dL (ref 32.0–36.0)
MCV: 87.7 fL (ref 79.3–98.0)
MONO#: 0.3 10*3/uL (ref 0.1–0.9)
MONO%: 8.9 % (ref 0.0–14.0)
NEUT#: 2.4 10*3/uL (ref 1.5–6.5)
NEUT%: 66.7 % (ref 39.0–75.0)
Platelets: 173 10*3/uL (ref 140–400)
RBC: 3.12 10*6/uL — AB (ref 4.20–5.82)
RDW: 20.7 % — ABNORMAL HIGH (ref 11.0–14.6)
WBC: 3.6 10*3/uL — ABNORMAL LOW (ref 4.0–10.3)
lymph#: 0.8 10*3/uL — ABNORMAL LOW (ref 0.9–3.3)

## 2014-10-18 MED ORDER — ONDANSETRON 8 MG/NS 50 ML IVPB
INTRAVENOUS | Status: AC
  Filled 2014-10-18: qty 8

## 2014-10-18 MED ORDER — DEXAMETHASONE SODIUM PHOSPHATE 10 MG/ML IJ SOLN
10.0000 mg | Freq: Once | INTRAMUSCULAR | Status: AC
  Administered 2014-10-18: 10 mg via INTRAVENOUS

## 2014-10-18 MED ORDER — DEXAMETHASONE SODIUM PHOSPHATE 10 MG/ML IJ SOLN
INTRAMUSCULAR | Status: AC
  Filled 2014-10-18: qty 1

## 2014-10-18 MED ORDER — PACLITAXEL PROTEIN-BOUND CHEMO INJECTION 100 MG
100.0000 mg/m2 | Freq: Once | INTRAVENOUS | Status: AC
  Administered 2014-10-18: 200 mg via INTRAVENOUS
  Filled 2014-10-18: qty 40

## 2014-10-18 MED ORDER — SODIUM CHLORIDE 0.9 % IV SOLN
Freq: Once | INTRAVENOUS | Status: AC
  Administered 2014-10-18: 10:00:00 via INTRAVENOUS

## 2014-10-18 MED ORDER — ONDANSETRON 8 MG/50ML IVPB (CHCC)
8.0000 mg | Freq: Once | INTRAVENOUS | Status: AC
  Administered 2014-10-18: 8 mg via INTRAVENOUS

## 2014-10-18 NOTE — Patient Instructions (Signed)
[facility] Discharge Instructions for Patients Receiving Chemotherapy  Today you received the following chemotherapy agents Abraxane.  To help prevent nausea and vomiting after your treatment, we encourage you to take your nausea medication.   If you develop nausea and vomiting that is not controlled by your nausea medication, call the clinic.   BELOW ARE SYMPTOMS THAT SHOULD BE REPORTED IMMEDIATELY:  *FEVER GREATER THAN 100.5 F  *CHILLS WITH OR WITHOUT FEVER  NAUSEA AND VOMITING THAT IS NOT CONTROLLED WITH YOUR NAUSEA MEDICATION  *UNUSUAL SHORTNESS OF BREATH  *UNUSUAL BRUISING OR BLEEDING  TENDERNESS IN MOUTH AND THROAT WITH OR WITHOUT PRESENCE OF ULCERS  *URINARY PROBLEMS  *BOWEL PROBLEMS  UNUSUAL RASH Items with * indicate a potential emergency and should be followed up as soon as possible.  Feel free to call the clinic you have any questions or concerns. The clinic phone number is [phone].

## 2014-10-22 ENCOUNTER — Observation Stay (HOSPITAL_COMMUNITY)
Admission: EM | Admit: 2014-10-22 | Discharge: 2014-10-23 | Disposition: A | Discharge status: Home / Self Care | Payer: BC Managed Care – PPO | Attending: Internal Medicine | Admitting: Internal Medicine

## 2014-10-22 ENCOUNTER — Encounter (HOSPITAL_COMMUNITY): Payer: Self-pay | Admitting: Emergency Medicine

## 2014-10-22 ENCOUNTER — Emergency Department (HOSPITAL_COMMUNITY): Payer: BC Managed Care – PPO

## 2014-10-22 DIAGNOSIS — G4700 Insomnia, unspecified: Secondary | ICD-10-CM | POA: Diagnosis not present

## 2014-10-22 DIAGNOSIS — R0602 Shortness of breath: Principal | ICD-10-CM | POA: Insufficient documentation

## 2014-10-22 DIAGNOSIS — E780 Pure hypercholesterolemia: Secondary | ICD-10-CM | POA: Diagnosis not present

## 2014-10-22 DIAGNOSIS — Z923 Personal history of irradiation: Secondary | ICD-10-CM | POA: Insufficient documentation

## 2014-10-22 DIAGNOSIS — E119 Type 2 diabetes mellitus without complications: Secondary | ICD-10-CM | POA: Diagnosis not present

## 2014-10-22 DIAGNOSIS — C3490 Malignant neoplasm of unspecified part of unspecified bronchus or lung: Secondary | ICD-10-CM | POA: Insufficient documentation

## 2014-10-22 DIAGNOSIS — Z87891 Personal history of nicotine dependence: Secondary | ICD-10-CM | POA: Insufficient documentation

## 2014-10-22 DIAGNOSIS — C3431 Malignant neoplasm of lower lobe, right bronchus or lung: Secondary | ICD-10-CM

## 2014-10-22 DIAGNOSIS — R000 Tachycardia, unspecified: Secondary | ICD-10-CM | POA: Diagnosis present

## 2014-10-22 DIAGNOSIS — Z79899 Other long term (current) drug therapy: Secondary | ICD-10-CM | POA: Insufficient documentation

## 2014-10-22 DIAGNOSIS — E785 Hyperlipidemia, unspecified: Secondary | ICD-10-CM | POA: Insufficient documentation

## 2014-10-22 DIAGNOSIS — J449 Chronic obstructive pulmonary disease, unspecified: Secondary | ICD-10-CM | POA: Insufficient documentation

## 2014-10-22 DIAGNOSIS — R918 Other nonspecific abnormal finding of lung field: Secondary | ICD-10-CM | POA: Diagnosis present

## 2014-10-22 DIAGNOSIS — I471 Supraventricular tachycardia: Secondary | ICD-10-CM

## 2014-10-22 HISTORY — DX: Malignant neoplasm of unspecified part of unspecified bronchus or lung: C34.90

## 2014-10-22 LAB — BASIC METABOLIC PANEL
Anion gap: 19 — ABNORMAL HIGH (ref 5–15)
BUN: 14 mg/dL (ref 6–23)
CHLORIDE: 100 meq/L (ref 96–112)
CO2: 18 mEq/L — ABNORMAL LOW (ref 19–32)
Calcium: 9.6 mg/dL (ref 8.4–10.5)
Creatinine, Ser: 0.77 mg/dL (ref 0.50–1.35)
GFR calc non Af Amer: >90 mL/min (ref >90)
Glucose, Bld: 154 mg/dL — ABNORMAL HIGH (ref 70–99)
POTASSIUM: 3 meq/L — AB (ref 3.7–5.3)
Sodium: 137 mEq/L (ref 137–147)

## 2014-10-22 LAB — I-STAT TROPONIN, ED: TROPONIN I, POC: 0 ng/mL (ref 0.00–0.08)

## 2014-10-22 LAB — CBC
HCT: 30.9 % — ABNORMAL LOW (ref 39.0–52.0)
HEMOGLOBIN: 10.6 g/dL — AB (ref 13.0–17.0)
MCH: 29.8 pg (ref 26.0–34.0)
MCHC: 34.3 g/dL (ref 30.0–36.0)
MCV: 86.8 fL (ref 78.0–100.0)
Platelets: 181 10*3/uL (ref 150–400)
RBC: 3.56 MIL/uL — AB (ref 4.22–5.81)
RDW: 19.4 % — ABNORMAL HIGH (ref 11.5–15.5)
WBC: 3.8 10*3/uL — AB (ref 4.0–10.5)

## 2014-10-22 MED ORDER — SODIUM CHLORIDE 0.9 % IJ SOLN: 3.0000 mL | Freq: Two times a day (BID) | INTRAMUSCULAR | Status: DC

## 2014-10-22 MED ORDER — ENOXAPARIN SODIUM 30 MG/0.3ML ~~LOC~~ SOLN
30.0000 mg | SUBCUTANEOUS | Status: DC
  Administered 2014-10-22: 30 mg via SUBCUTANEOUS
  Filled 2014-10-22 (×2): qty 0.3

## 2014-10-22 MED ORDER — ONDANSETRON HCL 4 MG/2ML IJ SOLN
4.0000 mg | Freq: Four times a day (QID) | INTRAMUSCULAR | Status: DC | PRN
Start: 2014-10-22 — End: 2014-10-23
  Administered 2014-10-22: 4 mg via INTRAVENOUS
  Filled 2014-10-22: qty 2

## 2014-10-22 MED ORDER — LORAZEPAM 2 MG/ML IJ SOLN
0.5000 mg | INTRAMUSCULAR | Status: DC | PRN
  Administered 2014-10-22 – 2014-10-23 (×2): 0.5 mg via INTRAVENOUS
  Filled 2014-10-22 (×2): qty 1

## 2014-10-22 MED ORDER — IOHEXOL 350 MG/ML SOLN
100.0000 mL | Freq: Once | INTRAVENOUS | Status: AC | PRN
  Administered 2014-10-22: 100 mL via INTRAVENOUS

## 2014-10-22 MED ORDER — IPRATROPIUM-ALBUTEROL 0.5-2.5 (3) MG/3ML IN SOLN
3.0000 mL | RESPIRATORY_TRACT | Status: DC
  Administered 2014-10-22 – 2014-10-23 (×3): 3 mL via RESPIRATORY_TRACT
  Filled 2014-10-22 (×3): qty 3

## 2014-10-22 MED ORDER — ATORVASTATIN CALCIUM 80 MG PO TABS
80.0000 mg | ORAL_TABLET | Freq: Every day | ORAL | Status: DC
  Administered 2014-10-22 – 2014-10-23 (×2): 80 mg via ORAL
  Filled 2014-10-22 (×2): qty 1

## 2014-10-22 MED ORDER — TEMAZEPAM 15 MG PO CAPS
15.0000 mg | ORAL_CAPSULE | Freq: Every evening | ORAL | Status: DC | PRN
  Administered 2014-10-22: 15 mg via ORAL
  Filled 2014-10-22: qty 1

## 2014-10-22 MED ORDER — OXYCODONE HCL 5 MG PO TABS
5.0000 mg | ORAL_TABLET | ORAL | Status: DC | PRN
  Administered 2014-10-23 (×2): 5 mg via ORAL
  Filled 2014-10-22 (×2): qty 1

## 2014-10-22 MED ORDER — ACETAMINOPHEN 650 MG RE SUPP: 650.0000 mg | Freq: Four times a day (QID) | RECTAL | Status: DC | PRN

## 2014-10-22 MED ORDER — ACETAMINOPHEN 325 MG PO TABS: 650.0000 mg | ORAL_TABLET | Freq: Four times a day (QID) | ORAL | Status: DC | PRN

## 2014-10-22 MED ORDER — PANTOPRAZOLE SODIUM 40 MG PO TBEC
40.0000 mg | DELAYED_RELEASE_TABLET | Freq: Every day | ORAL | Status: DC
  Administered 2014-10-23: 40 mg via ORAL
  Filled 2014-10-22: qty 1

## 2014-10-22 MED ORDER — BUDESONIDE-FORMOTEROL FUMARATE 160-4.5 MCG/ACT IN AERO
2.0000 | INHALATION_SPRAY | Freq: Two times a day (BID) | RESPIRATORY_TRACT | Status: DC
  Administered 2014-10-23: 2 via RESPIRATORY_TRACT
  Filled 2014-10-22: qty 6

## 2014-10-22 MED ORDER — ALUM & MAG HYDROXIDE-SIMETH 200-200-20 MG/5ML PO SUSP: 30.0000 mL | Freq: Four times a day (QID) | ORAL | Status: DC | PRN

## 2014-10-22 MED ORDER — HYDROMORPHONE HCL 1 MG/ML IJ SOLN
0.5000 mg | INTRAMUSCULAR | Status: DC | PRN
  Administered 2014-10-22 – 2014-10-23 (×3): 1 mg via INTRAVENOUS
  Filled 2014-10-22 (×3): qty 1

## 2014-10-22 MED ORDER — ONDANSETRON HCL 4 MG PO TABS: 4.0000 mg | ORAL_TABLET | Freq: Four times a day (QID) | ORAL | Status: DC | PRN

## 2014-10-22 MED ORDER — SODIUM CHLORIDE 0.9 % IV SOLN
INTRAVENOUS | Status: DC
  Administered 2014-10-22: 75 mL/h via INTRAVENOUS
  Administered 2014-10-23: 11:00:00 via INTRAVENOUS

## 2014-10-22 NOTE — ED Notes (Signed)
Pt has lung CA and has had progressive SOB since yesterday. R lung is collapsed per family. Alert and oriented.

## 2014-10-22 NOTE — H&P (Signed)
[facility] Admission History and Physical       Daryl Vega WVP:[id number] DOB: November 03, 1946 DOA: 10/22/2014  Referring physician: EDP PCP: [name], MD  Specialists:   Chief Complaint:  SOB  HPI: Daryl Vega is a 68 y.o. male with Stage III b Lung Cancer S/P Radiation Rx, and currently on Chemo Rx with last Chemo Rx  4 days ago who presents to the ED with complaints of increased and persistent SOB x 1 day.  He denies fevers and chills and chest pain.   A CTA of the chest was negative for a pulmonary embolism and revealed treatment related changes.  He was also found to have tachycardia and was referred for Observation.       Review of Systems:  Constitutional: No Weight Loss, No Weight Gain, Night Sweats, Fevers, Chills, Dizziness, Fatigue, or Generalized Weakness HEENT: No Headaches, Difficulty Swallowing,Tooth/Dental Problems,Sore Throat,  No Sneezing, Rhinitis, Ear Ache, Nasal Congestion, or Post Nasal Drip,  Cardio-vascular:  No Chest pain, Orthopnea, PND, Edema in Lower Extremities, Anasarca, Dizziness, Palpitations  Resp:   +Dyspnea, No DOE,  +Non-Productive Cough, No Hemoptysis, No Wheezing.    GI: No Heartburn, Indigestion, Abdominal Pain, Nausea, Vomiting, Diarrhea, Hematemesis, Hematochezia, Melena, Change in Bowel Habits,  +Loss of Appetite  GU: No Dysuria, Change in Color of Urine, No Urgency or Frequency, No Flank pain.  Musculoskeletal: No Joint Pain or Swelling, No Decreased Range of Motion, No Back Pain.  Neurologic: No Syncope, No Seizures, Muscle Weakness, Paresthesia, Vision Disturbance or Loss, No Diplopia, No Vertigo, No Difficulty Walking,  Skin: No Rash or Lesions. Psych: No Change in Mood or Affect, No Depression or Anxiety, No Memory loss, No Confusion, or Hallucinations   Past Medical History  Diagnosis Date  . DM (diabetes mellitus)   . Pneumonia   . Hypercholesteremia   . Melanoma   . Radiation 08/03/14-08/23/14    35 gray to  right chest  . Lung cancer     Past Surgical History  Procedure Laterality Date  . Video bronchoscopy Bilateral 07/20/2014    Procedure: VIDEO BRONCHOSCOPY WITHOUT FLUORO;  Surgeon: Tanda Rockers, MD;  Location: [facility];  Service: Cardiopulmonary;  Laterality: Bilateral;       Prior to Admission medications   Medication Sig Start Date End Date Taking? Authorizing Provider  atorvastatin (LIPITOR) 80 MG tablet Take 80 mg by mouth daily.   Yes Historical Provider, MD  budesonide-formoterol (SYMBICORT) 160-4.5 MCG/ACT inhaler Take 2 puffs first thing in am and then another 2 puffs about 12 hours later. 10/17/14  Yes Tanda Rockers, MD  Cholecalciferol (VITAMIN D3 PO) Take 10,000 Units by mouth daily.   Yes Historical Provider, MD  Cinnamon 500 MG capsule Take 500 mg by mouth daily.   Yes Historical Provider, MD  Dapagliflozin Propanediol (FARXIGA) 5 MG TABS Take 5 mg by mouth 2 (two) times daily.   Yes Historical Provider, MD  glimepiride (AMARYL) 4 MG tablet Take 4 mg by mouth 2 (two) times daily. Take before meals   Yes Historical Provider, MD  hyaluronate sodium (RADIAPLEXRX) GEL Apply 1 application topically once.   Yes Historical Provider, MD  HYDROcodone-acetaminophen (NORCO) 5-325 MG per tablet 1-2 up to every 4 hours for pain or cough Patient taking differently: 2 tablets. 1-2 up to every 4 hours for pain or cough 10/11/14  Yes Adrena E Johnson, PA-C  LORazepam (ATIVAN) 0.5 MG tablet Take 1 tablet (0.5 mg total) by mouth every 8 (eight) hours  as needed for anxiety. 10/11/14  Yes Adrena E Johnson, PA-C  metFORMIN (GLUCOPHAGE) 500 MG tablet Take 500 mg by mouth 2 (two) times daily with a meal.   Yes Historical Provider, MD  omeprazole (PRILOSEC) 20 MG capsule Take 20 mg by mouth daily.   Yes Historical Provider, MD  prochlorperazine (COMPAZINE) 10 MG tablet Take 1 tablet (10 mg total) by mouth every 6 (six) hours as needed for nausea or vomiting. 09/27/14  Yes Curt Bears, MD    temazepam (RESTORIL) 15 MG capsule Take 15 mg by mouth at bedtime as needed for sleep (sleep).    Yes Historical Provider, MD      No Known Allergies   Social History:  reports that he quit smoking about 4 months ago. His smoking use included Cigarettes and Cigars. He has a 100 pack-year smoking history. His smokeless tobacco use includes Chew. He reports that he does not drink alcohol or use illicit drugs.     Family History  Problem Relation Age of Onset  . Heart disease Mother   . Cancer Mother     ? type       Physical Exam:  GEN:  Pleasant Thin Ill Apearing  68 y.o. Caucasian male examined  and in no acute distress; cooperative with exam Filed Vitals:   10/22/14 1628 10/22/14 1641 10/22/14 1817 10/22/14 1951  BP: 128/74  125/88 105/63  Pulse: 119  119 112  Temp:      TempSrc:      Resp:   21 19  SpO2: 95% 98% 99% 96%   Blood pressure 105/63, pulse 112, temperature 97.4 F (36.3 C), temperature source Oral, resp. rate 19, SpO2 96 %. PSYCH: He is alert and oriented x4; does not appear anxious does not appear depressed; affect is normal HEENT: Normocephalic and Atraumatic, Mucous membranes pink; PERRLA; EOM intact; Fundi:  Benign;  No scleral icterus, Nares: Patent, Oropharynx: Clear,     Neck:  FROM, No Cervical Lymphadenopathy nor Thyromegaly or Carotid Bruit; No JVD; Breasts:: Not examined CHEST WALL: No tenderness CHEST: Normal respiration, clear to auscultation bilaterally HEART: Tachycardic Regular rate and rhythm; no murmurs rubs or gallops BACK: No kyphosis or scoliosis; No CVA tenderness ABDOMEN: Positive Bowel Sounds, Scaphoid, Soft Non-Tender; No Masses, No Organomegaly. Rectal Exam: Not done EXTREMITIES: No Cyanosis, Clubbing, or Edema; No Ulcerations. Genitalia: not examined PULSES: 2+ and symmetric SKIN: Normal hydration no rash or ulceration CNS:  Alert and Oriented X 4, No Focal Deficits Vascular: pulses palpable throughout    Labs on Admission:   Basic Metabolic Panel:  Recent Labs Lab 10/18/14 0915 10/22/14 1637  NA 140 137  K 3.5 3.0*  CL  --  100  CO2 18* 18*  GLUCOSE 193* 154*  BUN 10.5 14  CREATININE 0.9 0.77  CALCIUM 9.2 9.6   Liver Function Tests:  Recent Labs Lab 10/18/14 0915  AST 13  ALT 13  ALKPHOS 85  BILITOT 0.49  PROT 6.2*  ALBUMIN 3.3*   No results for input(s): LIPASE, AMYLASE in the last 168 hours. No results for input(s): AMMONIA in the last 168 hours. CBC:  Recent Labs Lab 10/18/14 0915 10/22/14 1637  WBC 3.6* 3.8*  NEUTROABS 2.4  --   HGB 9.3* 10.6*  HCT 27.4* 30.9*  MCV 87.7 86.8  PLT 173 181   Cardiac Enzymes: No results for input(s): CKTOTAL, CKMB, CKMBINDEX, TROPONINI in the last 168 hours.  BNP (last 3 results) No results for input(s): PROBNP in the  last 8760 hours. CBG: No results for input(s): GLUCAP in the last 168 hours.  Radiological Exams on Admission: Dg Chest 2 View (if Patient Has Fever And/or Copd)  10/22/2014   CLINICAL DATA:  Pt has lung CA (diagnosed in June) and has had progressive SOB since yesterday, pt tries to cough but struggles with getting a full cough out (more like short grunts) Hx DM - on meds and ex-smoker.  EXAM: CHEST  2 VIEW  COMPARISON:  07/31/2014  FINDINGS: Linear opacity with associated volume loss extends superiorly and laterally from the right hilum retracting the hilar structures superiorly. This is consistent with treatment related scarring.  Remainder of the lungs is clear. Lungs are mildly hyperexpanded. No pleural effusion or pneumothorax.  Cardiac silhouette is normal in size and configuration. Aorta is mildly uncoiled. No mediastinal masses. Left hilum is unremarkable.  IMPRESSION: 1. No acute cardiopulmonary disease. 2. 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   Electronically Signed   By: Lajean Manes M.D.   On: 10/22/2014 17:12   Ct Angio Chest Pe W/cm &/or Wo Cm  10/22/2014   CLINICAL DATA:   Patient with history of lung cancer and progressive shortness of breath.  EXAM: CT ANGIOGRAPHY CHEST WITH CONTRAST  TECHNIQUE: Multidetector CT imaging of the chest was performed using the standard protocol during bolus administration of intravenous contrast. Multiplanar CT image reconstructions and MIPs were obtained to evaluate the vascular anatomy.  CONTRAST:  140mL OMNIPAQUE IOHEXOL 350 MG/ML SOLN  COMPARISON:  CT chest 07/17/2014; PET-CT 07/26/2014.  FINDINGS: Motion artifact limits evaluation of the distal segmental and subsegmental pulmonary arteries particularly within the bilateral lower lobes. Within the above limitation, no definite evidence for pulmonary embolism.  Visualized thyroid is unremarkable. No enlarged axillary, mediastinal or hilar lymphadenopathy. Unchanged 9 mm left periaortic lymph node (image 67; series 4). There is a small amount of pericardial fluid.  There is persistent narrowing of the right upper lobe bronchus. Interval decrease in size of right suprahilar mass measuring 2.2 x 2.2 cm, previously 4.0 x 3.0 cm. Additionally there has been interval retraction of right upper lobe with increased consolidation, favored to represent post radiation change. Within the superior aspect of the right lower lobe there is associated retraction. Additionally there is suggestion of a 7 mm irregular nodule (image 34; series 7). Persistent ground-glass opacity within the left lower lobe. Additionally there is peripheral bandlike consolidative and ground-glass opacity within the left lower lobe. Small right pleural effusion. No definite pneumothorax.  Visualization of the upper abdomen demonstrates no focal abnormality. There is circumferential wall thickening of the mid and distal esophagus. Small amount of fluid visualize within the esophagus. No aggressive or acute appearing osseous lesions.  Review of the MIP images confirms the above findings.  IMPRESSION: Motion artifact limits evaluation of the  distal segmental and subsegmental pulmonary arteries, particularly within the bilateral lower lobes. Within the above limitation, no evidence for pulmonary embolism.  Interval decrease in size of right suprahilar mass.  Interval retraction of the right upper lobe with associated consolidation, favored to be post treatment in etiology. Attention on follow-up is recommended.  Interval development of retraction within the superior segment of the right lower lobe however there is suggestion of a central 7 mm nodule. Findings may be posttreatment in etiology however the possibility of metastatic disease to this location is not excluded. Recommend attention on follow-up.  Persistent nonspecific ground-glass and bandlike consolidative opacity within the left lower lobe.  This may represent scarring, treatment effect or potentially an infectious/inflammatory process. Attention on follow-up is recommended.  Small pericardial effusion.  Small right pleural effusion.  Wall thickening of the esophagus as can be seen with esophagitis. Fluid within the esophagus as can be seen with reflux.   Electronically Signed   By: Lovey Newcomer M.D.   On: 10/22/2014 18:29     EKG: Independently reviewed.    Assessment/Plan:   68 y.o. male with   Principal Problem:   1.   Shortness of breath- due to Lung Ca, and COPD, however Sats are good without O2 supp   DOUNebs   O2 PRN        Active Problems:   2.   Sinus tachycardia   Check TSH        3.   COPD mixed type   DUONeb     O2 PRN        4.   Lung mass/ Sq cell ca 100% obst RUL with Daryl lateral wall and BI 50% obst    Completed Radiation Rx   On Chemo Rx , last Chemo Rx 10/18/2014     5.   Diabetes mellitus   Hold Oral Anti-Hypoglycemics especially Metformin   SSI Coverage PRN   Check HbA1C     6.   Hyperlipidemia   Continue Atorvastatin Rx     7.   DVT Prophylaxis   Lovenox     Code Status:   FULL CODE  Family Communication:    Wife at  Bedside Disposition Plan:      Observation  Time spent:  [address] [facility] Pager [phone]   If [place] Please Contact the Day Rounding Team MD for [facility]  If 7PM-7AM, Please Contact Night-Floor Coverage  [email or web address] Password [facility] 10/22/2014, 8:28 PM

## 2014-10-22 NOTE — Progress Notes (Signed)
Chaplain referred to see pt while rounding. Daryl Vega was in a slight panic when chaplain arrived. He stated his panic had lessen from the time he arrived. Chaplain talked with him and through a modified relaxation session his panic was lessened and he began breathing more evenly. He stated that he was receiving excellent care and appreciated the staff's attention to his needs. Daryl Vega states he is an outpatient at the [facility] He attributed his panic to not being able to breath or cough up anything when he coughs. Chaplains are available should Daryl Vega desire further spiritual and emotional care.  [name]. [name], [place]

## 2014-10-22 NOTE — Plan of Care (Signed)
Problem: Phase I Progression Outcomes Goal: Hemodynamically stable Outcome: Completed/Met Date Met:  10/22/14

## 2014-10-22 NOTE — Plan of Care (Signed)
Problem: Phase I Progression Outcomes Goal: Pain controlled Outcome: Completed/Met Date Met:  10/22/14

## 2014-10-22 NOTE — Plan of Care (Signed)
Problem: Phase I Progression Outcomes Goal: Dyspnea controlled at rest Outcome: Completed/Met Date Met:  10/22/14     

## 2014-10-22 NOTE — ED Notes (Signed)
Called floor to state patient was about to be transported to floor. Secretary informed room "has not been zapped". Will call back when room is ready.

## 2014-10-22 NOTE — ED Provider Notes (Signed)
CSN: [id number]     Arrival date & time 10/22/14  1617 History   First MD Initiated Contact with Patient 10/22/14 1626     Chief Complaint  Patient presents with  . Lung Cancer  . Shortness of Breath     (Consider location/radiation/quality/duration/timing/severity/associated sxs/prior Treatment) Patient is a 68 y.o. male presenting with shortness of breath. The history is provided by the patient.  Shortness of Breath Severity:  Moderate Onset quality:  Gradual Duration:  2 days Timing:  Constant Progression:  Unchanged Chronicity:  New Context: not URI   Relieved by:  Nothing Worsened by:  Nothing tried Associated symptoms: no abdominal pain, no cough, no fever and no vomiting     Past Medical History  Diagnosis Date  . DM (diabetes mellitus)   . Pneumonia   . Hypercholesteremia   . Melanoma   . Radiation 08/03/14-08/23/14    35 gray to right chest  . Lung cancer    Past Surgical History  Procedure Laterality Date  . Video bronchoscopy Bilateral 07/20/2014    Procedure: VIDEO BRONCHOSCOPY WITHOUT FLUORO;  Surgeon: Tanda Rockers, MD;  Location: [facility];  Service: Cardiopulmonary;  Laterality: Bilateral;   Family History  Problem Relation Age of Onset  . Heart disease Mother   . Cancer Mother     ? type   History  Substance Use Topics  . Smoking status: Former Smoker -- 2.00 packs/day for 50 years    Types: Cigarettes, Cigars    Quit date: 06/12/2014  . Smokeless tobacco: Current User    Types: Chew  . Alcohol Use: No    Review of Systems  Constitutional: Negative for fever.  Respiratory: Negative for cough and shortness of breath.   Gastrointestinal: Negative for vomiting and abdominal pain.  All other systems reviewed and are negative.     Allergies  Review of patient's allergies indicates no known allergies.  Home Medications   Prior to Admission medications   Medication Sig Start Date End Date Taking? Authorizing Provider  atorvastatin  (LIPITOR) 80 MG tablet Take 80 mg by mouth daily.   Yes Historical Provider, MD  budesonide-formoterol (SYMBICORT) 160-4.5 MCG/ACT inhaler Take 2 puffs first thing in am and then another 2 puffs about 12 hours later. 10/17/14  Yes Tanda Rockers, MD  Cholecalciferol (VITAMIN D3 PO) Take 10,000 Units by mouth daily.   Yes Historical Provider, MD  Cinnamon 500 MG capsule Take 500 mg by mouth daily.   Yes Historical Provider, MD  Dapagliflozin Propanediol (FARXIGA) 5 MG TABS Take 5 mg by mouth 2 (two) times daily.   Yes Historical Provider, MD  glimepiride (AMARYL) 4 MG tablet Take 4 mg by mouth 2 (two) times daily. Take before meals   Yes Historical Provider, MD  hyaluronate sodium (RADIAPLEXRX) GEL Apply 1 application topically once.   Yes Historical Provider, MD  HYDROcodone-acetaminophen (NORCO) 5-325 MG per tablet 1-2 up to every 4 hours for pain or cough Patient taking differently: 2 tablets. 1-2 up to every 4 hours for pain or cough 10/11/14  Yes Adrena E Johnson, PA-C  LORazepam (ATIVAN) 0.5 MG tablet Take 1 tablet (0.5 mg total) by mouth every 8 (eight) hours as needed for anxiety. 10/11/14  Yes Adrena E Johnson, PA-C  metFORMIN (GLUCOPHAGE) 500 MG tablet Take 500 mg by mouth 2 (two) times daily with a meal.   Yes Historical Provider, MD  omeprazole (PRILOSEC) 20 MG capsule Take 20 mg by mouth daily.   Yes Historical Provider,  MD  prochlorperazine (COMPAZINE) 10 MG tablet Take 1 tablet (10 mg total) by mouth every 6 (six) hours as needed for nausea or vomiting. 09/27/14  Yes Curt Bears, MD  temazepam (RESTORIL) 15 MG capsule Take 15 mg by mouth at bedtime as needed for sleep (sleep).    Yes Historical Provider, MD   BP 128/74 mmHg  Pulse 119  Temp(Src) 97.4 F (36.3 C) (Oral)  Resp 20  SpO2 98% Physical Exam  Constitutional: He is oriented to person, place, and time. He appears well-developed and well-nourished. No distress.  HENT:  Head: Normocephalic and atraumatic.   Mouth/Throat: Oropharynx is clear and moist. No oropharyngeal exudate.  Eyes: EOM are normal. Pupils are equal, round, and reactive to light.  Neck: Normal range of motion. Neck supple.  Cardiovascular: Regular rhythm.  Tachycardia present.  Exam reveals no friction rub.   No murmur heard. Pulmonary/Chest: Effort normal and breath sounds normal. No respiratory distress. He has no wheezes. He has no rales.  Abdominal: Soft. He exhibits no distension. There is no tenderness. There is no rebound.  Musculoskeletal: Normal range of motion. He exhibits no edema.  Neurological: He is alert and oriented to person, place, and time.  Skin: No rash noted. He is not diaphoretic.  Nursing note and vitals reviewed.   ED Course  Procedures (including critical care time) Labs Review Labs Reviewed  BASIC METABOLIC PANEL - Abnormal; Notable for the following:    Potassium 3.0 (*)    CO2 18 (*)    Glucose, Bld 154 (*)    Anion gap 19 (*)    All other components within normal limits  CBC - Abnormal; Notable for the following:    WBC 3.8 (*)    RBC 3.56 (*)    Hemoglobin 10.6 (*)    HCT 30.9 (*)    RDW 19.4 (*)    All other components within normal limits  I-STAT TROPOININ, ED    Imaging Review Dg Chest 2 View (if Patient Has Fever And/or Copd)  10/22/2014   CLINICAL DATA:  Pt has lung CA (diagnosed in June) and has had progressive SOB since yesterday, pt tries to cough but struggles with getting a full cough out (more like short grunts) Hx DM - on meds and ex-smoker.  EXAM: CHEST  2 VIEW  COMPARISON:  07/31/2014  FINDINGS: Linear opacity with associated volume loss extends superiorly and laterally from the right hilum retracting the hilar structures superiorly. This is consistent with treatment related scarring.  Remainder of the lungs is clear. Lungs are mildly hyperexpanded. No pleural effusion or pneumothorax.  Cardiac silhouette is normal in size and configuration. Aorta is mildly uncoiled. No  mediastinal masses. Left hilum is unremarkable.  IMPRESSION: 1. No acute cardiopulmonary disease. 2. 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   Electronically Signed   By: Lajean Manes M.D.   On: 10/22/2014 17:12     EKG Interpretation   Date/Time:  Sunday October 22 2014 16:37:03 EST Ventricular Rate:  115 PR Interval:  148 QRS Duration: 100 QT Interval:  425 QTC Calculation: 588 R Axis:   61 Text Interpretation:  Sinus tachycardia Borderline repolarization  abnormality Prolonged QT interval Baseline wander in lead(s) V1 No prior  for comparison Confirmed by Mingo Amber  MD, Alisah Grandberry ([id number]) on 10/22/2014 4:45:41  PM      MDM   Final diagnoses:  SOB (shortness of breath)    68 year old male with history of stage III  lung cancer currently on chemotherapy presents with shortness of breath since yesterday. Denies chest pain, fever, productive cough, hemoptysis. He's having some mild labored respirations here on exam. Lung sounds are clear. Chest x-ray shows some right lung scarring without any gross pneumothorax. With his tachycardia almost over 120, will scan for PE. No hx of PE. PE scan negative. I spoke with Dr. Alen Blew who agreed that admission is reasonable for observation. Admitted by Dr. Arnoldo Morale.    Evelina Bucy, MD 10/22/14 2221

## 2014-10-22 NOTE — Plan of Care (Signed)
Problem: Phase I Progression Outcomes Goal: O2 sats > or equal 90% or at baseline Outcome: Completed/Met Date Met:  10/22/14

## 2014-10-23 ENCOUNTER — Encounter (HOSPITAL_COMMUNITY): Payer: Self-pay | Admitting: *Deleted

## 2014-10-23 ENCOUNTER — Ambulatory Visit (HOSPITAL_COMMUNITY): Admission: RE | Admit: 2014-10-23 | Payer: BC Managed Care – PPO | Source: Ambulatory Visit

## 2014-10-23 DIAGNOSIS — R918 Other nonspecific abnormal finding of lung field: Secondary | ICD-10-CM

## 2014-10-23 LAB — CBC
HCT: 24.5 % — ABNORMAL LOW (ref 39.0–52.0)
Hemoglobin: 8.5 g/dL — ABNORMAL LOW (ref 13.0–17.0)
MCH: 30.1 pg (ref 26.0–34.0)
MCHC: 34.7 g/dL (ref 30.0–36.0)
MCV: 86.9 fL (ref 78.0–100.0)
Platelets: 179 10*3/uL (ref 150–400)
RBC: 2.82 MIL/uL — ABNORMAL LOW (ref 4.22–5.81)
RDW: 19.7 % — AB (ref 11.5–15.5)
WBC: 3 10*3/uL — ABNORMAL LOW (ref 4.0–10.5)

## 2014-10-23 LAB — GLUCOSE, CAPILLARY
Glucose-Capillary: 111 mg/dL — ABNORMAL HIGH (ref 70–99)
Glucose-Capillary: 245 mg/dL — ABNORMAL HIGH (ref 70–99)
Glucose-Capillary: 192 mg/dL — ABNORMAL HIGH (ref 70–99)

## 2014-10-23 LAB — BASIC METABOLIC PANEL
ANION GAP: 14 (ref 5–15)
BUN: 9 mg/dL (ref 6–23)
CALCIUM: 8.5 mg/dL (ref 8.4–10.5)
CO2: 22 meq/L (ref 19–32)
CREATININE: 0.83 mg/dL (ref 0.50–1.35)
Chloride: 104 mEq/L (ref 96–112)
GFR calc Af Amer: >90 mL/min (ref >90)
GFR calc non Af Amer: 89 mL/min — ABNORMAL LOW (ref >90)
Glucose, Bld: 125 mg/dL — ABNORMAL HIGH (ref 70–99)
Potassium: 3 mEq/L — ABNORMAL LOW (ref 3.7–5.3)
Sodium: 140 mEq/L (ref 137–147)

## 2014-10-23 LAB — TSH: TSH: 1.37 u[IU]/mL (ref 0.350–4.500)

## 2014-10-23 LAB — HEMOGLOBIN A1C
HEMOGLOBIN A1C: 7.3 % — AB (ref <5.7)
Mean Plasma Glucose: 163 mg/dL — ABNORMAL HIGH (ref <117)

## 2014-10-23 MED ORDER — INSULIN ASPART 100 UNIT/ML ~~LOC~~ SOLN
0.0000 [IU] | Freq: Three times a day (TID) | SUBCUTANEOUS | Status: DC
Start: 2014-10-23 — End: 2014-10-23
  Administered 2014-10-23: 3 [IU] via SUBCUTANEOUS

## 2014-10-23 MED ORDER — IPRATROPIUM-ALBUTEROL 0.5-2.5 (3) MG/3ML IN SOLN
3.0000 mL | Freq: Three times a day (TID) | RESPIRATORY_TRACT | Status: DC
  Administered 2014-10-23: 3 mL via RESPIRATORY_TRACT
  Filled 2014-10-23 (×2): qty 3

## 2014-10-23 MED ORDER — POTASSIUM CHLORIDE ER 10 MEQ PO TBCR: 20.0000 meq | EXTENDED_RELEASE_TABLET | Freq: Two times a day (BID) | ORAL | Status: DC

## 2014-10-23 NOTE — Progress Notes (Signed)
UR completed 

## 2014-10-23 NOTE — Progress Notes (Signed)
Pt much improved after having a second BM around 0530.  No longer complaining of epigastric or back pain, denies nausea now.  Pt did have some high anxiety/confused periods earlier in shift after respiratory tx.  Pt o2 sats remained 96 to 99% on Ra since admission.  Requesting an increase in his diet from clears.  He has tolerated clears well this AM.

## 2014-10-23 NOTE — Discharge Summary (Signed)
Physician Discharge Summary  Daryl Vega [facility] IRJ:[id number] DOB: October 09, 1946 DOA: 10/22/2014  PCP: [name], MD  Admit date: 10/22/2014 Discharge date: 10/23/2014  Time spent: >30 minutes  Recommendations for Outpatient Follow-up:  Follow resp status and symptoms resolution Patient needs to avoid second hand smoking  Discharge Diagnoses:  Principal Problem:   Shortness of breath Active Problems:   COPD mixed type   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 and BI 50% obst    Sinus tachycardia   Diabetes mellitus   Hyperlipidemia   Discharge Condition: stable and improved. Will discharge home with family care and follow up with PCP in 1 week.  Diet recommendation: low carb diet  Filed Weights   10/22/14 2040  Weight: 70.988 kg (156 lb 8 oz)    History of present illness:  68 y.o. male with Stage III b Lung Cancer S/P Radiation Rx, and currently on Chemo Rx with last Chemo Rx 4 days prior to admission; presented to the ED with complaints of increased and persistent SOB x 1 day. He denies fevers, cough, N/V, chills and chest pain. A CTA of the chest done in ED, was negative for pulmonary embolism and revealed treatment related changes.    Hospital Course:  1. Shortness of breath- due to Lung Ca, and COPD, however Sats are good without O2 supplementation and no signs of infiltrates or superimposed infection on CXR. -no fever, no tachypnea and no elevation of WBC's -patient responded to use of nebulizer treatments and no smoking environment -advise to avoid second hand smoking -discussed use of humidifier during cold temp; to prevent dry air from heating -follow up with PCP in 1 week  2. Sinus tachycardia: slight dehydration and use of albuterol -improved with IVF's -patient and family educated about correct use of rescue inhalers -TSH WNL  3. COPD mixed type: no acute exacerbation to  required steroids. -will continue correct use of home inhaler regimen -patient advised to avoid second hand smoking  4. 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 and BI 50% obst  -patient has Completed Radiation Rx; repeat CT chest with signs of improvement (decrease size of suprahilar mass) -continue follow up with oncology and continue checmotherapy  5. Diabetes mellitus: continue low carbohydrates diet -patient advise to continue home hypoglycemic regimen and to follow with PCP  6. Hyperlipidemia: -Continue lipitor   7-insomnia: continue restoril  Procedures:  See below for x-ray reports   Consultations:  None  Discharge Exam: Filed Vitals:   10/23/14 0530  BP: 105/60  Pulse: 105  Temp: 97.8 F (36.6 C)  Resp: 18    General: no CP, no SOB. Reports he is feeling back to baseline and will like to be discharge. No fever Cardiovascular: mild tachycardia (HR105); no rubs, no gallops, no murmurs  Respiratory: no wheezing; scattered rhonchi Abd: soft, NT, ND, positive BS Neuro: non focal deficit appreciated  Discharge Instructions You were cared for by a hospitalist during your hospital stay. If you have any questions about your discharge medications or the care you received while you were in the hospital after you are discharged, you can call the unit and asked to speak with the hospitalist on call if the hospitalist that took care of you is not available. Once you are discharged, your primary care physician will handle any further medical issues. Please note that NO REFILLS for any discharge medications will be authorized once you are discharged, as it  is imperative that you return to your primary care physician (or establish a relationship with a primary care physician if you do not have one) for your aftercare needs so that they can reassess your need for medications and monitor your lab values.  Discharge  Instructions    Discharge instructions    Complete by:  As directed   Take medications as prescribed Maintain good hydration Avoid second hand smoking, extreme temperatures and maintain good level of humidity with upcoming use of heating Arrange follow up with PCP in 1 week          Current Discharge Medication List    START taking these medications   Details  potassium chloride (K-DUR) 10 MEQ tablet Take 2 tablets (20 mEq total) by mouth 2 (two) times daily. Qty: 12 tablet, Refills: 0      CONTINUE these medications which have NOT CHANGED   Details  atorvastatin (LIPITOR) 80 MG tablet Take 80 mg by mouth daily.    budesonide-formoterol (SYMBICORT) 160-4.5 MCG/ACT inhaler Take 2 puffs first thing in am and then another 2 puffs about 12 hours later. Qty: 1 Inhaler, Refills: 3    Cholecalciferol (VITAMIN D3 PO) Take 10,000 Units by mouth daily.    Cinnamon 500 MG capsule Take 500 mg by mouth daily.    Dapagliflozin Propanediol (FARXIGA) 5 MG TABS Take 5 mg by mouth 2 (two) times daily.    glimepiride (AMARYL) 4 MG tablet Take 4 mg by mouth 2 (two) times daily. Take before meals    hyaluronate sodium (RADIAPLEXRX) GEL Apply 1 application topically once.    HYDROcodone-acetaminophen (NORCO) 5-325 MG per tablet 1-2 up to every 4 hours for pain or cough Qty: 40 tablet, Refills: 0   Associated Diagnoses: Malignant neoplasm of lung, unspecified laterality, unspecified part of lung    LORazepam (ATIVAN) 0.5 MG tablet Take 1 tablet (0.5 mg total) by mouth every 8 (eight) hours as needed for anxiety. Qty: 30 tablet, Refills: 0    metFORMIN (GLUCOPHAGE) 500 MG tablet Take 500 mg by mouth 2 (two) times daily with a meal.    omeprazole (PRILOSEC) 20 MG capsule Take 20 mg by mouth daily.    prochlorperazine (COMPAZINE) 10 MG tablet Take 1 tablet (10 mg total) by mouth every 6 (six) hours as needed for nausea or vomiting. Qty: 30 tablet, Refills: 1   Associated Diagnoses: Malignant  neoplasm of lung, unspecified laterality, unspecified part of lung    temazepam (RESTORIL) 15 MG capsule Take 15 mg by mouth at bedtime as needed for sleep (sleep).        No Known Allergies Follow-up Information    Follow up with [name], MD. Schedule an appointment as soon as possible for a visit in 1 week.   Specialty:  Family Medicine   Contact information:   [place] [address] [place] [address] [phone]        The results of significant diagnostics from this hospitalization (including imaging, microbiology, ancillary and laboratory) are listed below for reference.    Significant Diagnostic Studies: Dg Chest 2 View (if Patient Has Fever And/or Copd)  10/22/2014   CLINICAL DATA:  Pt has lung CA (diagnosed in June) and has had progressive SOB since yesterday, pt tries to cough but struggles with getting a full cough out (more like short grunts) Hx DM - on meds and ex-smoker.  EXAM: CHEST  2 VIEW  COMPARISON:  07/31/2014  FINDINGS: Linear opacity with associated  volume loss extends superiorly and laterally from the right hilum retracting the hilar structures superiorly. This is consistent with treatment related scarring.  Remainder of the lungs is clear. Lungs are mildly hyperexpanded. No pleural effusion or pneumothorax.  Cardiac silhouette is normal in size and configuration. Aorta is mildly uncoiled. No mediastinal masses. Left hilum is unremarkable.  IMPRESSION: 1. No acute cardiopulmonary disease. 2. 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   Electronically Signed   By: Lajean Manes M.D.   On: 10/22/2014 17:12   Ct Angio Chest Pe W/cm &/or Wo Cm  10/22/2014   CLINICAL DATA:  Patient with history of lung cancer and progressive shortness of breath.  EXAM: CT ANGIOGRAPHY CHEST WITH CONTRAST  TECHNIQUE: Multidetector CT imaging of the chest was performed using the standard protocol during bolus administration  of intravenous contrast. Multiplanar CT image reconstructions and MIPs were obtained to evaluate the vascular anatomy.  CONTRAST:  155mL OMNIPAQUE IOHEXOL 350 MG/ML SOLN  COMPARISON:  CT chest 07/17/2014; PET-CT 07/26/2014.  FINDINGS: Motion artifact limits evaluation of the distal segmental and subsegmental pulmonary arteries particularly within the bilateral lower lobes. Within the above limitation, no definite evidence for pulmonary embolism.  Visualized thyroid is unremarkable. No enlarged axillary, mediastinal or hilar lymphadenopathy. Unchanged 9 mm left periaortic lymph node (image 67; series 4). There is a small amount of pericardial fluid.  There is persistent narrowing of the right upper lobe bronchus. Interval decrease in size of right suprahilar mass measuring 2.2 x 2.2 cm, previously 4.0 x 3.0 cm. Additionally there has been interval retraction of right upper lobe with increased consolidation, favored to represent post radiation change. Within the superior aspect of the right lower lobe there is associated retraction. Additionally there is suggestion of a 7 mm irregular nodule (image 34; series 7). Persistent ground-glass opacity within the left lower lobe. Additionally there is peripheral bandlike consolidative and ground-glass opacity within the left lower lobe. Small right pleural effusion. No definite pneumothorax.  Visualization of the upper abdomen demonstrates no focal abnormality. There is circumferential wall thickening of the mid and distal esophagus. Small amount of fluid visualize within the esophagus. No aggressive or acute appearing osseous lesions.  Review of the MIP images confirms the above findings.  IMPRESSION: Motion artifact limits evaluation of the distal segmental and subsegmental pulmonary arteries, particularly within the bilateral lower lobes. Within the above limitation, no evidence for pulmonary embolism.  Interval decrease in size of right suprahilar mass.  Interval  retraction of the right upper lobe with associated consolidation, favored to be post treatment in etiology. Attention on follow-up is recommended.  Interval development of retraction within the superior segment of the right lower lobe however there is suggestion of a central 7 mm nodule. Findings may be posttreatment in etiology however the possibility of metastatic disease to this location is not excluded. Recommend attention on follow-up.  Persistent nonspecific ground-glass and bandlike consolidative opacity within the left lower lobe. This may represent scarring, treatment effect or potentially an infectious/inflammatory process. Attention on follow-up is recommended.  Small pericardial effusion.  Small right pleural effusion.  Wall thickening of the esophagus as can be seen with esophagitis. Fluid within the esophagus as can be seen with reflux.   Electronically Signed   By: Lovey Newcomer M.D.   On: 10/22/2014 18:29   Labs: Basic Metabolic Panel:  Recent Labs Lab 10/18/14 0915 10/22/14 1637 10/23/14 0415  NA 140 137 140  K 3.5 3.0* 3.0*  CL  --  100 104  CO2 18* 18* 22  GLUCOSE 193* 154* 125*  BUN 10.5 14 9   CREATININE 0.9 0.77 0.83  CALCIUM 9.2 9.6 8.5   Liver Function Tests:  Recent Labs Lab 10/18/14 0915  AST 13  ALT 13  ALKPHOS 85  BILITOT 0.49  PROT 6.2*  ALBUMIN 3.3*   CBC:  Recent Labs Lab 10/18/14 0915 10/22/14 1637 10/23/14 0415  WBC 3.6* 3.8* 3.0*  NEUTROABS 2.4  --   --   HGB 9.3* 10.6* 8.5*  HCT 27.4* 30.9* 24.5*  MCV 87.7 86.8 86.9  PLT 173 181 179   CBG:  Recent Labs Lab 10/22/14 2046 10/23/14 0941 10/23/14 1156  GLUCAP 111* 245* 192*    Signed:  Barton Dubois  [facility] 10/23/2014, 1:24 PM

## 2014-10-25 ENCOUNTER — Other Ambulatory Visit (HOSPITAL_BASED_OUTPATIENT_CLINIC_OR_DEPARTMENT_OTHER): Payer: BC Managed Care – PPO

## 2014-10-25 ENCOUNTER — Telehealth: Payer: Self-pay | Admitting: *Deleted

## 2014-10-25 ENCOUNTER — Telehealth: Payer: Self-pay | Admitting: Internal Medicine

## 2014-10-25 ENCOUNTER — Other Ambulatory Visit: Payer: Self-pay | Admitting: *Deleted

## 2014-10-25 ENCOUNTER — Ambulatory Visit (HOSPITAL_BASED_OUTPATIENT_CLINIC_OR_DEPARTMENT_OTHER): Payer: BC Managed Care – PPO | Admitting: Nurse Practitioner

## 2014-10-25 ENCOUNTER — Encounter: Payer: Self-pay | Admitting: Nurse Practitioner

## 2014-10-25 ENCOUNTER — Ambulatory Visit (HOSPITAL_BASED_OUTPATIENT_CLINIC_OR_DEPARTMENT_OTHER): Payer: BC Managed Care – PPO

## 2014-10-25 DIAGNOSIS — Z5111 Encounter for antineoplastic chemotherapy: Secondary | ICD-10-CM

## 2014-10-25 DIAGNOSIS — C3490 Malignant neoplasm of unspecified part of unspecified bronchus or lung: Secondary | ICD-10-CM

## 2014-10-25 DIAGNOSIS — R197 Diarrhea, unspecified: Secondary | ICD-10-CM

## 2014-10-25 DIAGNOSIS — D6481 Anemia due to antineoplastic chemotherapy: Secondary | ICD-10-CM

## 2014-10-25 DIAGNOSIS — C3411 Malignant neoplasm of upper lobe, right bronchus or lung: Secondary | ICD-10-CM

## 2014-10-25 DIAGNOSIS — T451X5A Adverse effect of antineoplastic and immunosuppressive drugs, initial encounter: Secondary | ICD-10-CM

## 2014-10-25 DIAGNOSIS — C3491 Malignant neoplasm of unspecified part of right bronchus or lung: Secondary | ICD-10-CM

## 2014-10-25 DIAGNOSIS — J449 Chronic obstructive pulmonary disease, unspecified: Secondary | ICD-10-CM

## 2014-10-25 DIAGNOSIS — E876 Hypokalemia: Secondary | ICD-10-CM

## 2014-10-25 LAB — COMPREHENSIVE METABOLIC PANEL (CC13)
ALT: 17 U/L (ref 0–55)
ANION GAP: 9 meq/L (ref 3–11)
AST: 14 U/L (ref 5–34)
Albumin: 3.3 g/dL — ABNORMAL LOW (ref 3.5–5.0)
Alkaline Phosphatase: 75 U/L (ref 40–150)
BILIRUBIN TOTAL: 0.72 mg/dL (ref 0.20–1.20)
BUN: 10.8 mg/dL (ref 7.0–26.0)
CALCIUM: 8.4 mg/dL (ref 8.4–10.4)
CHLORIDE: 108 meq/L (ref 98–109)
CO2: 22 meq/L (ref 22–29)
CREATININE: 0.9 mg/dL (ref 0.7–1.3)
GLUCOSE: 205 mg/dL — AB (ref 70–140)
Potassium: 2.8 mEq/L — CL (ref 3.5–5.1)
Sodium: 139 mEq/L (ref 136–145)
Total Protein: 6.3 g/dL — ABNORMAL LOW (ref 6.4–8.3)

## 2014-10-25 LAB — CBC WITH DIFFERENTIAL/PLATELET
BASO%: 1.3 % (ref 0.0–2.0)
BASOS ABS: 0 10*3/uL (ref 0.0–0.1)
EOS%: 0.5 % (ref 0.0–7.0)
Eosinophils Absolute: 0 10*3/uL (ref 0.0–0.5)
HEMATOCRIT: 26.8 % — AB (ref 38.4–49.9)
HGB: 9 g/dL — ABNORMAL LOW (ref 13.0–17.1)
LYMPH%: 15.9 % (ref 14.0–49.0)
MCH: 30 pg (ref 27.2–33.4)
MCHC: 33.7 g/dL (ref 32.0–36.0)
MCV: 89 fL (ref 79.3–98.0)
MONO#: 0.3 10*3/uL (ref 0.1–0.9)
MONO%: 8.6 % (ref 0.0–14.0)
NEUT#: 2.8 10*3/uL (ref 1.5–6.5)
NEUT%: 73.7 % (ref 39.0–75.0)
Platelets: 214 10*3/uL (ref 140–400)
RBC: 3.01 10*6/uL — ABNORMAL LOW (ref 4.20–5.82)
RDW: 21.6 % — ABNORMAL HIGH (ref 11.0–14.6)
WBC: 3.8 10*3/uL — AB (ref 4.0–10.3)
lymph#: 0.6 10*3/uL — ABNORMAL LOW (ref 0.9–3.3)

## 2014-10-25 LAB — TECHNOLOGIST REVIEW

## 2014-10-25 MED ORDER — POTASSIUM CHLORIDE CRYS ER 20 MEQ PO TBCR: 20.0000 meq | EXTENDED_RELEASE_TABLET | ORAL | Status: DC

## 2014-10-25 MED ORDER — HYDROCODONE-ACETAMINOPHEN 5-325 MG PO TABS: ORAL_TABLET | ORAL | Status: DC

## 2014-10-25 MED ORDER — ONDANSETRON 8 MG/50ML IVPB (CHCC)
8.0000 mg | Freq: Once | INTRAVENOUS | Status: AC
  Administered 2014-10-25: 8 mg via INTRAVENOUS

## 2014-10-25 MED ORDER — LORAZEPAM 0.5 MG PO TABS: 0.5000 mg | ORAL_TABLET | Freq: Three times a day (TID) | ORAL | Status: DC | PRN

## 2014-10-25 MED ORDER — POTASSIUM CHLORIDE CRYS ER 20 MEQ PO TBCR
40.0000 meq | EXTENDED_RELEASE_TABLET | Freq: Once | ORAL | Status: AC
  Administered 2014-10-25: 40 meq via ORAL
  Filled 2014-10-25: qty 2

## 2014-10-25 MED ORDER — PACLITAXEL PROTEIN-BOUND CHEMO INJECTION 100 MG
100.0000 mg/m2 | Freq: Once | INTRAVENOUS | Status: AC
  Administered 2014-10-25: 200 mg via INTRAVENOUS
  Filled 2014-10-25: qty 40

## 2014-10-25 MED ORDER — SODIUM CHLORIDE 0.9 % IV SOLN
Freq: Once | INTRAVENOUS | Status: AC
  Administered 2014-10-25: 10:00:00 via INTRAVENOUS

## 2014-10-25 MED ORDER — DEXAMETHASONE SODIUM PHOSPHATE 10 MG/ML IJ SOLN
10.0000 mg | Freq: Once | INTRAMUSCULAR | Status: AC
  Administered 2014-10-25: 10 mg via INTRAVENOUS

## 2014-10-25 MED ORDER — DEXAMETHASONE SODIUM PHOSPHATE 10 MG/ML IJ SOLN
INTRAMUSCULAR | Status: AC
  Filled 2014-10-25: qty 1

## 2014-10-25 MED ORDER — ONDANSETRON 8 MG/NS 50 ML IVPB
INTRAVENOUS | Status: AC
  Filled 2014-10-25: qty 8

## 2014-10-25 MED ORDER — PROCHLORPERAZINE MALEATE 10 MG PO TABS: 10.0000 mg | ORAL_TABLET | Freq: Four times a day (QID) | ORAL | Status: DC | PRN

## 2014-10-25 NOTE — Assessment & Plan Note (Signed)
Reviewed all of patient's lab results with both patient and his wife today in detail.  Also reviewed recent chest CT results as well.  It does appear that there has been overall improvement with interval decrease in the size of a right suprahilar mass.  Patient will proceed today with cycle 3, day 15 of the Abraxane only portion of his carboplatin/Abraxane chemotherapy regimen.  He has plans to return for cycle 4, day 1 of the same regimen on 11/01/2014.  He will return on 11/08/2014 for labs/follow up visit/chemotherapy as well.

## 2014-10-25 NOTE — Assessment & Plan Note (Signed)
Potassium today was 2.8.  This is most likely secondary to diarrhea/dehydration. Patient was given potassium 40 mEq orally while at the [facility] today.  Also, patient is getting any new prescription for potassium 20 mEq twice a day to take at home as well.

## 2014-10-25 NOTE — Assessment & Plan Note (Signed)
Patient has been experiencing some intermittent diarrhea for the past several days.  He states he is having between 3 and 6 diarrhea episodes per 24 hour period.  He has not been taking any Imodium as previously directed.  Ice patient to try Imodium over-the-counter as directed.

## 2014-10-25 NOTE — Assessment & Plan Note (Signed)
Chemotherapy associated anemia; with hemoglobin improved from 8.5 up to 9.0.  Patient denies any worsening issues with either fatigue or shortness of breath.  Will continue to monitor closely.

## 2014-10-25 NOTE — Telephone Encounter (Signed)
K 2.8, Per Dr Vista Mink, rx written for 20meq daily x 7 days.  He is currently taking 66meq BID x 3 days.  Per Dr Vista Mink, pt is to start the 7 days supply once she is finished with his current K rx.  Infusion RN will communicate this to patient.

## 2014-10-25 NOTE — Progress Notes (Signed)
SYMPTOM MANAGEMENT CLINIC   HPI: Daryl Vega 68 y.o. male diagnosed with lung cancer.  Currently undergoing carboplatin/Abraxane chemotherapy regimen.  Patient was admitted with complaint of increased dyspnea secondary to both COPD and lung cancer from 10/22/2014 to 10/23/2014.  He is here for post admission followup; as well as to initiate cycle 3, day 15 of his carboplatin/Abraxane chemotherapy regimen.  Patient states he is doing much better since release from the hospital.  He states that both his energy level and his appetite remains stable.  He is complaining of some chronic diarrhea for the past few days.  He feels he may be slightly dehydrated from the chronic diarrhea.  He states he is not taking any over-the-counter antidiarrheal medications whatsoever.  He denies any recent fevers or chills.  He also denies any recent nausea or vomiting.   HPI  CURRENT THERAPY: Upcoming Treatment Dates - LUNG Abraxane / Carboplatin q21d Days with orders from any treatment category:  11/01/2014      SCHEDULING COMMUNICATION      ondansetron (ZOFRAN) IVPB 16 mg      Dexamethasone Sodium Phosphate (DECADRON) injection 20 mg      PACLitaxel-protein bound (ABRAXANE) chemo infusion 200 mg      CARBOplatin (PARAPLATIN) 500 mg in sodium chloride 0.9 % 250 mL chemo infusion      sodium chloride 0.9 % injection 10 mL      heparin lock flush 100 unit/mL      heparin lock flush 100 unit/mL      alteplase (CATHFLO ACTIVASE) injection 2 mg      sodium chloride 0.9 % injection 3 mL      0.9 %  sodium chloride infusion      TREATMENT CONDITIONS 11/08/2014      SCHEDULING COMMUNICATION      ondansetron (ZOFRAN) IVPB 8 mg      dexamethasone (DECADRON) injection 10 mg      PACLitaxel-protein bound (ABRAXANE) chemo infusion 200 mg      sodium chloride 0.9 % injection 10 mL      heparin lock flush 100 unit/mL      heparin lock flush 100 unit/mL      alteplase (CATHFLO ACTIVASE) injection 2 mg      sodium chloride 0.9 % injection 3 mL      0.9 %  sodium chloride infusion      TREATMENT CONDITIONS 11/15/2014      SCHEDULING COMMUNICATION      ondansetron (ZOFRAN) IVPB 8 mg      dexamethasone (DECADRON) injection 10 mg      PACLitaxel-protein bound (ABRAXANE) chemo infusion 200 mg      sodium chloride 0.9 % injection 10 mL      heparin lock flush 100 unit/mL      heparin lock flush 100 unit/mL      alteplase (CATHFLO ACTIVASE) injection 2 mg      sodium chloride 0.9 % injection 3 mL      0.9 %  sodium chloride infusion      TREATMENT CONDITIONS    ROS  Past Medical History  Diagnosis Date  . DM (diabetes mellitus)   . Pneumonia   . Hypercholesteremia   . Melanoma   . Radiation 08/03/14-08/23/14    35 gray to right chest  . Lung cancer     Past Surgical History  Procedure Laterality Date  . Video bronchoscopy Bilateral 07/20/2014    Procedure: VIDEO BRONCHOSCOPY WITHOUT FLUORO;  Surgeon:  Tanda Rockers, MD;  Location: [facility];  Service: Cardiopulmonary;  Laterality: Bilateral;    has Post obstructive pna; COPD mixed type; Cough; Lung mass/ Sq cell ca 100% obst RUL with Daryl lateral wall and BI 50% obst ; Lung cancer; Shortness of breath; SOB (shortness of breath); Sinus tachycardia; Diabetes mellitus; Hyperlipidemia; Malignant neoplasm of lower lobe of right lung; Hypokalemia; Diarrhea; and Anemia associated with chemotherapy on his problem list.     has No Known Allergies.    Medication List       This list is accurate as of: 10/25/14 11:59 AM.  Always use your most recent med list.               atorvastatin 80 MG tablet  Commonly known as:  LIPITOR  Take 80 mg by mouth daily.     budesonide-formoterol 160-4.5 MCG/ACT inhaler  Commonly known as:  SYMBICORT  Take 2 puffs first thing in am and then another 2 puffs about 12 hours later.     Cinnamon 500 MG capsule  Take 500 mg by mouth daily.     FARXIGA 5 MG Tabs tablet  Generic drug:  dapagliflozin  propanediol  Take 5 mg by mouth 2 (two) times daily.     glimepiride 4 MG tablet  Commonly known as:  AMARYL  Take 4 mg by mouth 2 (two) times daily. Take before meals     hyaluronate sodium Gel  Apply 1 application topically once.     HYDROcodone-acetaminophen 5-325 MG per tablet  Commonly known as:  NORCO  1-2 up to every 4 hours for pain or cough     LORazepam 0.5 MG tablet  Commonly known as:  ATIVAN  Take 1 tablet (0.5 mg total) by mouth every 8 (eight) hours as needed for anxiety.     metFORMIN 500 MG tablet  Commonly known as:  GLUCOPHAGE  Take 500 mg by mouth 2 (two) times daily with a meal.     omeprazole 20 MG capsule  Commonly known as:  PRILOSEC  Take 20 mg by mouth daily.     potassium chloride 10 MEQ tablet  Commonly known as:  K-DUR  Take 2 tablets (20 mEq total) by mouth 2 (two) times daily.     potassium chloride SA 20 MEQ tablet  Commonly known as:  K-DUR,KLOR-CON  Take 1 tablet (20 mEq total) by mouth as directed. 20meq daily x 7 days.  Take for 7 days after completion of current rx for potassium     prochlorperazine 10 MG tablet  Commonly known as:  COMPAZINE  Take 1 tablet (10 mg total) by mouth every 6 (six) hours as needed for nausea or vomiting.     temazepam 15 MG capsule  Commonly known as:  RESTORIL  Take 15 mg by mouth at bedtime as needed for sleep (sleep).     VITAMIN D3 PO  Take 10,000 Units by mouth daily.         PHYSICAL EXAMINATION  Vitals: BP   Physical Exam  LABORATORY DATA:. Appointment on 10/25/2014  Component Date Value Ref Range Status  . WBC 10/25/2014 3.8* 4.0 - 10.3 10e3/uL Final  . NEUT# 10/25/2014 2.8  1.5 - 6.5 10e3/uL Final  . HGB 10/25/2014 9.0* 13.0 - 17.1 g/dL Final  . HCT 10/25/2014 26.8* 38.4 - 49.9 % Final  . Platelets 10/25/2014 214  140 - 400 10e3/uL Final  . MCV 10/25/2014 89.0  79.3 - 98.0 fL Final  .  MCH 10/25/2014 30.0  27.2 - 33.4 pg Final  . MCHC 10/25/2014 33.7  32.0 - 36.0 g/dL Final  . RBC  10/25/2014 3.01* 4.20 - 5.82 10e6/uL Final  . RDW 10/25/2014 21.6* 11.0 - 14.6 % Final  . lymph# 10/25/2014 0.6* 0.9 - 3.3 10e3/uL Final  . MONO# 10/25/2014 0.3  0.1 - 0.9 10e3/uL Final  . Eosinophils Absolute 10/25/2014 0.0  0.0 - 0.5 10e3/uL Final  . Basophils Absolute 10/25/2014 0.0  0.0 - 0.1 10e3/uL Final  . NEUT% 10/25/2014 73.7  39.0 - 75.0 % Final  . LYMPH% 10/25/2014 15.9  14.0 - 49.0 % Final  . MONO% 10/25/2014 8.6  0.0 - 14.0 % Final  . EOS% 10/25/2014 0.5  0.0 - 7.0 % Final  . BASO% 10/25/2014 1.3  0.0 - 2.0 % Final  . Sodium 10/25/2014 139  136 - 145 mEq/L Final  . Potassium 10/25/2014 2.8* 3.5 - 5.1 mEq/L Final  . Chloride 10/25/2014 108  98 - 109 mEq/L Final  . CO2 10/25/2014 22  22 - 29 mEq/L Final  . Glucose 10/25/2014 205* 70 - 140 mg/dl Final  . BUN 10/25/2014 10.8  7.0 - 26.0 mg/dL Final  . Creatinine 10/25/2014 0.9  0.7 - 1.3 mg/dL Final  . Total Bilirubin 10/25/2014 0.72  0.20 - 1.20 mg/dL Final  . Alkaline Phosphatase 10/25/2014 75  40 - 150 U/L Final  . AST 10/25/2014 14  5 - 34 U/L Final  . ALT 10/25/2014 17  0 - 55 U/L Final  . Total Protein 10/25/2014 6.3* 6.4 - 8.3 g/dL Final  . Albumin 10/25/2014 3.3* 3.5 - 5.0 g/dL Final  . Calcium 10/25/2014 8.4  8.4 - 10.4 mg/dL Final  . Anion Gap 10/25/2014 9  3 - 11 mEq/L Final  . Technologist Review 10/25/2014 few Metas and Myelocytes present   Final  Admission on 10/22/2014, Discharged on 10/23/2014  Component Date Value Ref Range Status  . Sodium 10/22/2014 137  137 - 147 mEq/L Final  . Potassium 10/22/2014 3.0* 3.7 - 5.3 mEq/L Final  . Chloride 10/22/2014 100  96 - 112 mEq/L Final  . CO2 10/22/2014 18* 19 - 32 mEq/L Final  . Glucose, Bld 10/22/2014 154* 70 - 99 mg/dL Final  . BUN 10/22/2014 14  6 - 23 mg/dL Final  . Creatinine, Ser 10/22/2014 0.77  0.50 - 1.35 mg/dL Final  . Calcium 10/22/2014 9.6  8.4 - 10.5 mg/dL Final  . GFR calc non Af Amer 10/22/2014 >90  >90 mL/min Final  . GFR calc Af Amer 10/22/2014  >90  >90 mL/min Final   Comment: (NOTE) The eGFR has been calculated using the CKD EPI equation. This calculation has not been validated in all clinical situations. eGFR's persistently <90 mL/min signify possible Chronic Kidney Disease.   . Anion gap 10/22/2014 19* 5 - 15 Final  . WBC 10/22/2014 3.8* 4.0 - 10.5 K/uL Final  . RBC 10/22/2014 3.56* 4.22 - 5.81 MIL/uL Final  . Hemoglobin 10/22/2014 10.6* 13.0 - 17.0 g/dL Final  . HCT 10/22/2014 30.9* 39.0 - 52.0 % Final  . MCV 10/22/2014 86.8  78.0 - 100.0 fL Final  . MCH 10/22/2014 29.8  26.0 - 34.0 pg Final  . MCHC 10/22/2014 34.3  30.0 - 36.0 g/dL Final  . RDW 10/22/2014 19.4* 11.5 - 15.5 % Final  . Platelets 10/22/2014 181  150 - 400 K/uL Final  . Troponin i, poc 10/22/2014 0.00  0.00 - 0.08 ng/mL Final  . Comment  3 10/22/2014          Final   Comment: Due to the release kinetics of cTnI, a negative result within the first hours of the onset of symptoms does not rule out myocardial infarction with certainty. If myocardial infarction is still suspected, repeat the test at appropriate intervals.   [name] TSH 10/23/2014 1.370  0.350 - 4.500 uIU/mL Final   Performed at [facility]  . Sodium 10/23/2014 140  137 - 147 mEq/L Final  . Potassium 10/23/2014 3.0* 3.7 - 5.3 mEq/L Final  . Chloride 10/23/2014 104  96 - 112 mEq/L Final  . CO2 10/23/2014 22  19 - 32 mEq/L Final  . Glucose, Bld 10/23/2014 125* 70 - 99 mg/dL Final  . BUN 10/23/2014 9  6 - 23 mg/dL Final  . Creatinine, Ser 10/23/2014 0.83  0.50 - 1.35 mg/dL Final  . Calcium 10/23/2014 8.5  8.4 - 10.5 mg/dL Final  . GFR calc non Af Amer 10/23/2014 89* >90 mL/min Final  . GFR calc Af Amer 10/23/2014 >90  >90 mL/min Final   Comment: (NOTE) The eGFR has been calculated using the CKD EPI equation. This calculation has not been validated in all clinical situations. eGFR's persistently <90 mL/min signify possible Chronic Kidney Disease.   . Anion gap 10/23/2014 14  5 - 15 Final   . WBC 10/23/2014 3.0* 4.0 - 10.5 K/uL Final  . RBC 10/23/2014 2.82* 4.22 - 5.81 MIL/uL Final  . Hemoglobin 10/23/2014 8.5* 13.0 - 17.0 g/dL Final   Comment: DELTA CHECK NOTED REPEATED TO VERIFY   . HCT 10/23/2014 24.5* 39.0 - 52.0 % Final  . MCV 10/23/2014 86.9  78.0 - 100.0 fL Final  . MCH 10/23/2014 30.1  26.0 - 34.0 pg Final  . MCHC 10/23/2014 34.7  30.0 - 36.0 g/dL Final  . RDW 10/23/2014 19.7* 11.5 - 15.5 % Final  . Platelets 10/23/2014 179  150 - 400 K/uL Final  . Glucose-Capillary 10/22/2014 111* 70 - 99 mg/dL Final  . Hgb A1c MFr Bld 10/23/2014 7.3* <5.7 % Final   Comment: (NOTE)                                                                       According to the ADA Clinical Practice Recommendations for 2011, when HbA1c is used as a screening test:  >=6.5%   Diagnostic of Diabetes Mellitus           (if abnormal result is confirmed) 5.7-6.4%   Increased risk of developing Diabetes Mellitus References:Diagnosis and Classification of Diabetes Mellitus,Diabetes BMWU,1324,40(NUUVO 1):S62-S69 and Standards of Medical Care in         Diabetes - 2011,Diabetes Care,2011,34 (Suppl 1):S11-S61.   . Mean Plasma Glucose 10/23/2014 163* <117 mg/dL Final   Performed at [facility]  . Glucose-Capillary 10/23/2014 245* 70 - 99 mg/dL Final  . Glucose-Capillary 10/23/2014 192* 70 - 99 mg/dL Final     RADIOGRAPHIC STUDIES: Dg Chest 2 View (if Patient Has Fever And/or Copd)  10/22/2014   CLINICAL DATA:  Pt has lung CA (diagnosed in June) and has had progressive SOB since yesterday, pt tries to cough but struggles with getting a full cough out (more like short grunts) Hx DM -  on meds and ex-smoker.  EXAM: CHEST  2 VIEW  COMPARISON:  07/31/2014  FINDINGS: Linear opacity with associated volume loss extends superiorly and laterally from the right hilum retracting the hilar structures superiorly. This is consistent with treatment related scarring.  Remainder of the lungs is clear. Lungs  are mildly hyperexpanded. No pleural effusion or pneumothorax.  Cardiac silhouette is normal in size and configuration. Aorta is mildly uncoiled. No mediastinal masses. Left hilum is unremarkable.  IMPRESSION: 1. No acute cardiopulmonary disease. 2. 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   Electronically Signed   By: Lajean Manes M.D.   On: 10/22/2014 17:12   Ct Angio Chest Pe W/cm &/or Wo Cm  10/22/2014   CLINICAL DATA:  Patient with history of lung cancer and progressive shortness of breath.  EXAM: CT ANGIOGRAPHY CHEST WITH CONTRAST  TECHNIQUE: Multidetector CT imaging of the chest was performed using the standard protocol during bolus administration of intravenous contrast. Multiplanar CT image reconstructions and MIPs were obtained to evaluate the vascular anatomy.  CONTRAST:  154mL OMNIPAQUE IOHEXOL 350 MG/ML SOLN  COMPARISON:  CT chest 07/17/2014; PET-CT 07/26/2014.  FINDINGS: Motion artifact limits evaluation of the distal segmental and subsegmental pulmonary arteries particularly within the bilateral lower lobes. Within the above limitation, no definite evidence for pulmonary embolism.  Visualized thyroid is unremarkable. No enlarged axillary, mediastinal or hilar lymphadenopathy. Unchanged 9 mm left periaortic lymph node (image 67; series 4). There is a small amount of pericardial fluid.  There is persistent narrowing of the right upper lobe bronchus. Interval decrease in size of right suprahilar mass measuring 2.2 x 2.2 cm, previously 4.0 x 3.0 cm. Additionally there has been interval retraction of right upper lobe with increased consolidation, favored to represent post radiation change. Within the superior aspect of the right lower lobe there is associated retraction. Additionally there is suggestion of a 7 mm irregular nodule (image 34; series 7). Persistent ground-glass opacity within the left lower lobe. Additionally there is peripheral bandlike  consolidative and ground-glass opacity within the left lower lobe. Small right pleural effusion. No definite pneumothorax.  Visualization of the upper abdomen demonstrates no focal abnormality. There is circumferential wall thickening of the mid and distal esophagus. Small amount of fluid visualize within the esophagus. No aggressive or acute appearing osseous lesions.  Review of the MIP images confirms the above findings.  IMPRESSION: Motion artifact limits evaluation of the distal segmental and subsegmental pulmonary arteries, particularly within the bilateral lower lobes. Within the above limitation, no evidence for pulmonary embolism.  Interval decrease in size of right suprahilar mass.  Interval retraction of the right upper lobe with associated consolidation, favored to be post treatment in etiology. Attention on follow-up is recommended.  Interval development of retraction within the superior segment of the right lower lobe however there is suggestion of a central 7 mm nodule. Findings may be posttreatment in etiology however the possibility of metastatic disease to this location is not excluded. Recommend attention on follow-up.  Persistent nonspecific ground-glass and bandlike consolidative opacity within the left lower lobe. This may represent scarring, treatment effect or potentially an infectious/inflammatory process. Attention on follow-up is recommended.  Small pericardial effusion.  Small right pleural effusion.  Wall thickening of the esophagus as can be seen with esophagitis. Fluid within the esophagus as can be seen with reflux.   Electronically Signed   By: Lovey Newcomer M.D.   On: 10/22/2014 18:29    ASSESSMENT/PLAN:  Lung cancer: Reviewed all of patient's lab results with both patient and his wife today in detail.  Also reviewed recent chest CT results as well.  It does appear that there has been overall improvement with interval decrease in the size of a right suprahilar mass.  Patient  will proceed today with cycle 3, day 15 of the Abraxane only portion of his carboplatin/Abraxane chemotherapy regimen.  He has plans to return for cycle 4, day 1 of the same regimen on 11/01/2014.  He will return on 11/08/2014 for labs/follow up visit/chemotherapy as well.     Diarrhea: Patient has been experiencing some intermittent diarrhea for the past several days.  He states he is having between 3 and 6 diarrhea episodes per 24 hour period.  He has not been taking any Imodium as previously directed.  Ice patient to try Imodium over-the-counter as directed.   Hypokalemia: Potassium today was 2.8.  This is most likely secondary to diarrhea/dehydration. Patient was given potassium 40 mEq orally while at the [facility] today.  Also, patient is getting any new prescription for potassium 20 mEq twice a day to take at home as well.   Anemia: Chemotherapy associated anemia; with hemoglobin improved from 8.5 up to 9.0.  Patient denies any worsening issues with either fatigue or shortness of breath.  Will continue to monitor closely.  Patient stated understanding of all instructions; and was in agreement with this plan of care. The patient knows to call the clinic with any problems, questions or concerns.   This was a shared visit with Dr. Julien Nordmann today.  Total time spent with patient was 25 minutes;  with greater than 75 percent of that time spent in face to face counseling regarding his symptoms, and coordination of care and follow up.  Disclaimer: This note was dictated with voice recognition software. Similar sounding words can inadvertently be transcribed and may not be corrected upon review.   Drue Second, NP 10/25/2014   ADDENDUM: Hematology/Oncology Attending: I had a face to face encounter with the patient. I recommended his care plan. This is a very pleasant 68 years old white male with stage IIIB non-small cell lung cancer, squamous cell carcinoma. The patient is currently  undergoing systemic chemotherapy with carboplatin and Abraxane status post 3 cycles. He had a recent CT angiogram performed during a visit to the emergency department that showed improvement in his disease. I discussed the scan results with the patient today. I recommended for him to proceed with 3 more cycles of the same regimen. He would come back for follow-up visit in 2 weeks for reevaluation. The patient was advised to call immediately if he has any concerning symptoms in the interval.  Disclaimer: This note was dictated with voice recognition software. Similar sounding words can inadvertently be transcribed and may be missed upon review. Eilleen Kempf., MD 10/28/2014

## 2014-10-25 NOTE — Patient Instructions (Signed)
[facility] Discharge Instructions for Patients Receiving Chemotherapy  Today you received the following chemotherapy agents Abraxane  To help prevent nausea and vomiting after your treatment, we encourage you to take your nausea medication    If you develop nausea and vomiting that is not controlled by your nausea medication, call the clinic.   BELOW ARE SYMPTOMS THAT SHOULD BE REPORTED IMMEDIATELY:  *FEVER GREATER THAN 100.5 F  *CHILLS WITH OR WITHOUT FEVER  NAUSEA AND VOMITING THAT IS NOT CONTROLLED WITH YOUR NAUSEA MEDICATION  *UNUSUAL SHORTNESS OF BREATH  *UNUSUAL BRUISING OR BLEEDING  TENDERNESS IN MOUTH AND THROAT WITH OR WITHOUT PRESENCE OF ULCERS  *URINARY PROBLEMS  *BOWEL PROBLEMS  UNUSUAL RASH Items with * indicate a potential emergency and should be followed up as soon as possible.  Feel free to call the clinic you have any questions or concerns. The clinic phone number is [phone].

## 2014-10-27 ENCOUNTER — Telehealth: Payer: Self-pay | Admitting: Oncology

## 2014-10-27 NOTE — Telephone Encounter (Signed)
Pt reports his right ear is stopped up and he is having trouble breathing on the " right side of my lung". He does not sound to be in acute distress. He denies fever, chills, pain. I recommended he go to urgent care for evaluation . Chemo given on 10/25/14. 'I 'll see what i can do".

## 2014-10-27 NOTE — Telephone Encounter (Signed)
Daryl Vega called and was wondering if we can call in some antibiotics for him.  He said he has a "stopped up right ear and trouble breathing on his right side."  Advised him that he would need to see a doctor for antibiotics and that Dr. [name] is out of the office today.  Advised that he either see his primary care doctor or contact Dr. Worthy Flank office.  He said he would like to talk to Dr. Worthy Flank nurse.  Transferred him to Shauna Hugh, Therapist, sports.

## 2014-10-28 ENCOUNTER — Encounter (HOSPITAL_COMMUNITY): Payer: Self-pay | Admitting: *Deleted

## 2014-10-28 ENCOUNTER — Emergency Department (HOSPITAL_COMMUNITY): Payer: BC Managed Care – PPO

## 2014-10-28 ENCOUNTER — Emergency Department (HOSPITAL_COMMUNITY)
Admission: EM | Admit: 2014-10-28 | Discharge: 2014-10-28 | Disposition: A | Discharge status: Home / Self Care | Payer: BC Managed Care – PPO | Attending: Emergency Medicine | Admitting: Emergency Medicine

## 2014-10-28 DIAGNOSIS — Y9389 Activity, other specified: Secondary | ICD-10-CM | POA: Diagnosis not present

## 2014-10-28 DIAGNOSIS — C3490 Malignant neoplasm of unspecified part of unspecified bronchus or lung: Secondary | ICD-10-CM

## 2014-10-28 DIAGNOSIS — Z85820 Personal history of malignant melanoma of skin: Secondary | ICD-10-CM | POA: Diagnosis not present

## 2014-10-28 DIAGNOSIS — S0990XA Unspecified injury of head, initial encounter: Secondary | ICD-10-CM | POA: Insufficient documentation

## 2014-10-28 DIAGNOSIS — E780 Pure hypercholesterolemia: Secondary | ICD-10-CM | POA: Diagnosis not present

## 2014-10-28 DIAGNOSIS — R0602 Shortness of breath: Secondary | ICD-10-CM | POA: Insufficient documentation

## 2014-10-28 DIAGNOSIS — R0981 Nasal congestion: Secondary | ICD-10-CM

## 2014-10-28 DIAGNOSIS — W19XXXA Unspecified fall, initial encounter: Secondary | ICD-10-CM

## 2014-10-28 DIAGNOSIS — W06XXXA Fall from bed, initial encounter: Secondary | ICD-10-CM | POA: Diagnosis not present

## 2014-10-28 DIAGNOSIS — Y92092 Bedroom in other non-institutional residence as the place of occurrence of the external cause: Secondary | ICD-10-CM | POA: Insufficient documentation

## 2014-10-28 DIAGNOSIS — Z79899 Other long term (current) drug therapy: Secondary | ICD-10-CM | POA: Diagnosis not present

## 2014-10-28 DIAGNOSIS — Z87891 Personal history of nicotine dependence: Secondary | ICD-10-CM | POA: Insufficient documentation

## 2014-10-28 DIAGNOSIS — W01198A Fall on same level from slipping, tripping and stumbling with subsequent striking against other object, initial encounter: Secondary | ICD-10-CM | POA: Diagnosis not present

## 2014-10-28 DIAGNOSIS — E876 Hypokalemia: Secondary | ICD-10-CM | POA: Diagnosis not present

## 2014-10-28 DIAGNOSIS — Y998 Other external cause status: Secondary | ICD-10-CM | POA: Insufficient documentation

## 2014-10-28 DIAGNOSIS — Z8701 Personal history of pneumonia (recurrent): Secondary | ICD-10-CM | POA: Diagnosis not present

## 2014-10-28 DIAGNOSIS — J3489 Other specified disorders of nose and nasal sinuses: Secondary | ICD-10-CM | POA: Diagnosis not present

## 2014-10-28 LAB — I-STAT CHEM 8, ED
BUN: 10 mg/dL (ref 6–23)
CREATININE: 0.8 mg/dL (ref 0.50–1.35)
Calcium, Ion: 1.2 mmol/L (ref 1.13–1.30)
Chloride: 102 mEq/L (ref 96–112)
Glucose, Bld: 106 mg/dL — ABNORMAL HIGH (ref 70–99)
HCT: 26 % — ABNORMAL LOW (ref 39.0–52.0)
Hemoglobin: 8.8 g/dL — ABNORMAL LOW (ref 13.0–17.0)
POTASSIUM: 3.2 meq/L — AB (ref 3.7–5.3)
SODIUM: 140 meq/L (ref 137–147)
TCO2: 24 mmol/L (ref 0–100)

## 2014-10-28 LAB — CBC
HCT: 24.8 % — ABNORMAL LOW (ref 39.0–52.0)
HEMOGLOBIN: 8.6 g/dL — AB (ref 13.0–17.0)
MCH: 30.5 pg (ref 26.0–34.0)
MCHC: 34.7 g/dL (ref 30.0–36.0)
MCV: 87.9 fL (ref 78.0–100.0)
PLATELETS: 235 10*3/uL (ref 150–400)
RBC: 2.82 MIL/uL — AB (ref 4.22–5.81)
RDW: 20 % — ABNORMAL HIGH (ref 11.5–15.5)
WBC: 3.2 10*3/uL — AB (ref 4.0–10.5)

## 2014-10-28 MED ORDER — POTASSIUM CHLORIDE CRYS ER 20 MEQ PO TBCR
40.0000 meq | EXTENDED_RELEASE_TABLET | Freq: Once | ORAL | Status: AC
  Administered 2014-10-28: 40 meq via ORAL
  Filled 2014-10-28: qty 2

## 2014-10-28 MED ORDER — ONDANSETRON 4 MG PO TBDP
4.0000 mg | ORAL_TABLET | Freq: Once | ORAL | Status: AC
  Administered 2014-10-28: 4 mg via ORAL
  Filled 2014-10-28: qty 1

## 2014-10-28 MED ORDER — POTASSIUM CHLORIDE CRYS ER 20 MEQ PO TBCR: 20.0000 meq | EXTENDED_RELEASE_TABLET | Freq: Every day | ORAL | Status: DC

## 2014-10-28 MED ORDER — IPRATROPIUM BROMIDE 0.03 % NA SOLN
2.0000 | Freq: Once | NASAL | Status: AC
  Administered 2014-10-28: 2 via NASAL
  Filled 2014-10-28: qty 30

## 2014-10-28 NOTE — ED Notes (Signed)
Pt ambulating independently w/ steady gait on d/c in no acute distress, A&Ox4. D/c instructions reviewed w/ pt and family - pt and family deny any further questions or concerns at present. Rx given x2  

## 2014-10-28 NOTE — ED Notes (Signed)
Patient transported to CT 

## 2014-10-28 NOTE — ED Notes (Signed)
Pt reports shortness of breath and nasal congestion pt noted this a.m. - pt took a nebulizer treatment approx 2300 yesterday evening w/ minimal improvement in symptoms. Per pt's family pt also fell out of bed yesterday evening approx 2am. Pt w/ hx of lung CA, last chemo treatment on Wednesday, receives them weekly. Pt denies any chest pain, or fever.

## 2014-10-28 NOTE — ED Provider Notes (Addendum)
CSN: [id number]     Arrival date & time 10/28/14  0007 History   First MD Initiated Contact with Patient 10/28/14 0013     Chief Complaint  Patient presents with  . Shortness of Breath  . Nasal Congestion     (Consider location/radiation/quality/duration/timing/severity/associated sxs/prior Treatment) HPI Comments: Patient complaining of nasal congestion and hard time breathing through  his nose  has not taken any medication for symptoms    Also reports falling out of bed last night hitting his head on the floor  No LOC but persistent HA since.  Currently in the middle of chemotherapy for lung cancer-- denying chest SOB cough, fever, chest pain   Patient is a 68 y.o. male presenting with shortness of breath. The history is provided by the patient.  Shortness of Breath Severity:  Mild Onset quality:  Gradual Duration:  12 hours Timing:  Constant Progression:  Unchanged Chronicity:  New Relieved by:  None tried Worsened by:  Nothing tried Ineffective treatments:  None tried Associated symptoms: headaches and sore throat   Associated symptoms: no cough, no ear pain, no fever, no neck pain and no wheezing   Sore throat:    Severity:  Mild   Onset quality:  Gradual   Duration:  1 day   Timing:  Constant   Progression:  Unchanged Risk factors: hx of cancer     Past Medical History  Diagnosis Date  . DM (diabetes mellitus)   . Pneumonia   . Hypercholesteremia   . Melanoma   . Radiation 08/03/14-08/23/14    35 gray to right chest  . Lung cancer    Past Surgical History  Procedure Laterality Date  . Video bronchoscopy Bilateral 07/20/2014    Procedure: VIDEO BRONCHOSCOPY WITHOUT FLUORO;  Surgeon: Tanda Rockers, MD;  Location: [facility];  Service: Cardiopulmonary;  Laterality: Bilateral;   Family History  Problem Relation Age of Onset  . Heart disease Mother   . Cancer Mother     ? type   History  Substance Use Topics  . Smoking status: Former Smoker -- 2.00  packs/day for 50 years    Types: Cigarettes, Cigars    Quit date: 06/12/2014  . Smokeless tobacco: Current User    Types: Chew  . Alcohol Use: No    Review of Systems  Constitutional: Negative for fever and chills.  HENT: Positive for congestion, rhinorrhea and sore throat. Negative for ear pain.   Respiratory: Negative for cough, shortness of breath and wheezing.   Musculoskeletal: Negative for back pain, arthralgias and neck pain.  Neurological: Positive for headaches.  All other systems reviewed and are negative.     Allergies  Review of patient's allergies indicates no known allergies.  Home Medications   Prior to Admission medications   Medication Sig Start Date End Date Taking? Authorizing Provider  atorvastatin (LIPITOR) 80 MG tablet Take 80 mg by mouth daily.   Yes Historical Provider, MD  budesonide-formoterol (SYMBICORT) 160-4.5 MCG/ACT inhaler Take 2 puffs first thing in am and then another 2 puffs about 12 hours later. 10/17/14  Yes Tanda Rockers, MD  Cholecalciferol (VITAMIN D3 PO) Take 10,000 Units by mouth daily.   Yes Historical Provider, MD  Cinnamon 500 MG capsule Take 500 mg by mouth daily.   Yes Historical Provider, MD  Dapagliflozin Propanediol (FARXIGA) 5 MG TABS Take 5 mg by mouth 2 (two) times daily.   Yes Historical Provider, MD  glimepiride (AMARYL) 4 MG tablet Take 4 mg by  mouth 2 (two) times daily. Take before meals   Yes Historical Provider, MD  LORazepam (ATIVAN) 0.5 MG tablet Take 1 tablet (0.5 mg total) by mouth every 8 (eight) hours as needed for anxiety. 10/25/14  Yes Drue Second, NP  omeprazole (PRILOSEC) 20 MG capsule Take 20 mg by mouth daily.   Yes Historical Provider, MD  prochlorperazine (COMPAZINE) 10 MG tablet Take 1 tablet (10 mg total) by mouth every 6 (six) hours as needed for nausea or vomiting. 10/25/14  Yes Drue Second, NP  temazepam (RESTORIL) 15 MG capsule Take 15 mg by mouth at bedtime as needed for sleep (sleep).    Yes  Historical Provider, MD  hyaluronate sodium (RADIAPLEXRX) GEL Apply 1 application topically once.    Historical Provider, MD  metFORMIN (GLUCOPHAGE) 500 MG tablet Take 500 mg by mouth 2 (two) times daily with a meal.    Historical Provider, MD  potassium chloride (K-DUR) 10 MEQ tablet Take 2 tablets (20 mEq total) by mouth 2 (two) times daily. 10/23/14 10/26/14  Barton Dubois, MD  potassium chloride SA (K-DUR,KLOR-CON) 20 MEQ tablet Take 1 tablet (20 mEq total) by mouth daily. 10/28/14   Garald Balding, NP  potassium chloride SA (K-DUR,KLOR-CON) 20 MEQ tablet Take 1 tablet (20 mEq total) by mouth daily. 10/28/14   Garald Balding, NP   BP 121/64 mmHg  Pulse 95  Temp(Src) 97.4 F (36.3 C) (Oral)  Resp 16  SpO2 95% Physical Exam  Constitutional: He is oriented to person, place, and time. He appears well-developed and well-nourished. He appears distressed.  HENT:  Head: Normocephalic.  Right Ear: External ear normal.  Left Ear: External ear normal.  Eyes: Pupils are equal, round, and reactive to light.  Neck: Normal range of motion.  Cardiovascular: Normal rate and regular rhythm.   Abdominal: Soft.  Musculoskeletal: Normal range of motion. He exhibits no edema or tenderness.  Neurological: He is alert and oriented to person, place, and time.  Skin: There is pallor.  Nursing note and vitals reviewed.   ED Course  Procedures (including critical care time) Labs Review Labs Reviewed  CBC - Abnormal; Notable for the following:    WBC 3.2 (*)    RBC 2.82 (*)    Hemoglobin 8.6 (*)    HCT 24.8 (*)    RDW 20.0 (*)    All other components within normal limits  I-STAT CHEM 8, ED - Abnormal; Notable for the following:    Potassium 3.2 (*)    Glucose, Bld 106 (*)    Hemoglobin 8.8 (*)    HCT 26.0 (*)    All other components within normal limits    Imaging Review Dg Chest 2 View  10/28/2014   CLINICAL DATA:  Initial evaluation for shortness of breath chest congestion, personal history  of lung cancer  EXAM: CHEST  2 VIEW  COMPARISON:  10/22/2014 CT scan and 10/22/2014 plain film  FINDINGS: Right upper lobe parenchymal retraction related to radiation therapy stable. Right lung otherwise clear.  Heart size and vascular pattern are normal. Left lung is clear. No effusion. No change from prior study.  IMPRESSION: No active cardiopulmonary disease.   Electronically Signed   By: Skipper Cliche M.D.   On: 10/28/2014 01:20   Ct Head Wo Contrast  10/28/2014   CLINICAL DATA:  Initial evaluation after fall out of bed 24 hr ago, personal history of lung cancer  EXAM: CT HEAD WITHOUT CONTRAST  TECHNIQUE: Contiguous axial images were obtained from  the base of the skull through the vertex without intravenous contrast.  COMPARISON:  07/17/2014, and 08/01/2014  FINDINGS: Mild to moderate diffuse atrophy. No evidence of mass infarct hemorrhage or extra-axial fluid. No hydrocephalus. No significant opacification the visualized portions of the paranasal sinuses. No skull fracture.  IMPRESSION: No acute findings   Electronically Signed   By: Skipper Cliche M.D.   On: 10/28/2014 01:40     EKG Interpretation None      MDM   Final diagnoses:  Fall  SOB (shortness of breath)  Nasal congestion  Hypokalemia         Garald Balding, NP 10/28/14 2141  Merryl Hacker, MD 10/30/14 1623  Garald Balding, NP 11/02/14 [place], MD 11/04/14 [phone]

## 2014-11-01 ENCOUNTER — Other Ambulatory Visit (HOSPITAL_BASED_OUTPATIENT_CLINIC_OR_DEPARTMENT_OTHER): Payer: BLUE CROSS/BLUE SHIELD

## 2014-11-01 ENCOUNTER — Ambulatory Visit (HOSPITAL_BASED_OUTPATIENT_CLINIC_OR_DEPARTMENT_OTHER): Payer: BLUE CROSS/BLUE SHIELD

## 2014-11-01 ENCOUNTER — Telehealth: Payer: Self-pay | Admitting: Medical Oncology

## 2014-11-01 DIAGNOSIS — G4700 Insomnia, unspecified: Secondary | ICD-10-CM

## 2014-11-01 DIAGNOSIS — Z5111 Encounter for antineoplastic chemotherapy: Secondary | ICD-10-CM

## 2014-11-01 DIAGNOSIS — C3491 Malignant neoplasm of unspecified part of right bronchus or lung: Secondary | ICD-10-CM

## 2014-11-01 DIAGNOSIS — C3411 Malignant neoplasm of upper lobe, right bronchus or lung: Secondary | ICD-10-CM

## 2014-11-01 LAB — CBC WITH DIFFERENTIAL/PLATELET
BASO%: 1 % (ref 0.0–2.0)
BASOS ABS: 0 10*3/uL (ref 0.0–0.1)
EOS ABS: 0 10*3/uL (ref 0.0–0.5)
EOS%: 0.8 % (ref 0.0–7.0)
HCT: 29.4 % — ABNORMAL LOW (ref 38.4–49.9)
HGB: 9.7 g/dL — ABNORMAL LOW (ref 13.0–17.1)
LYMPH%: 22.3 % (ref 14.0–49.0)
MCH: 29.4 pg (ref 27.2–33.4)
MCHC: 33 g/dL (ref 32.0–36.0)
MCV: 89.1 fL (ref 79.3–98.0)
MONO#: 0.5 10*3/uL (ref 0.1–0.9)
MONO%: 11.3 % (ref 0.0–14.0)
NEUT%: 64.6 % (ref 39.0–75.0)
NEUTROS ABS: 2.6 10*3/uL (ref 1.5–6.5)
NRBC: 1 % — AB (ref 0–0)
PLATELETS: 254 10*3/uL (ref 140–400)
RBC: 3.3 10*6/uL — ABNORMAL LOW (ref 4.20–5.82)
RDW: 21.1 % — AB (ref 11.0–14.6)
WBC: 4 10*3/uL (ref 4.0–10.3)
lymph#: 0.9 10*3/uL (ref 0.9–3.3)

## 2014-11-01 LAB — COMPREHENSIVE METABOLIC PANEL (CC13)
ALT: 14 U/L (ref 0–55)
AST: 15 U/L (ref 5–34)
Albumin: 3.6 g/dL (ref 3.5–5.0)
Alkaline Phosphatase: 80 U/L (ref 40–150)
Anion Gap: 11 mEq/L (ref 3–11)
BUN: 7.7 mg/dL (ref 7.0–26.0)
CALCIUM: 9.9 mg/dL (ref 8.4–10.4)
CO2: 22 meq/L (ref 22–29)
Chloride: 107 mEq/L (ref 98–109)
Creatinine: 1 mg/dL (ref 0.7–1.3)
Glucose: 143 mg/dl — ABNORMAL HIGH (ref 70–140)
Potassium: 3.9 mEq/L (ref 3.5–5.1)
SODIUM: 141 meq/L (ref 136–145)
TOTAL PROTEIN: 6.6 g/dL (ref 6.4–8.3)
Total Bilirubin: 0.68 mg/dL (ref 0.20–1.20)

## 2014-11-01 LAB — TECHNOLOGIST REVIEW: Technologist Review: 2

## 2014-11-01 MED ORDER — TEMAZEPAM 15 MG PO CAPS: 15.0000 mg | ORAL_CAPSULE | Freq: Every evening | ORAL | Status: DC | PRN

## 2014-11-01 MED ORDER — PACLITAXEL PROTEIN-BOUND CHEMO INJECTION 100 MG
100.0000 mg/m2 | Freq: Once | INTRAVENOUS | Status: AC
  Administered 2014-11-01: 200 mg via INTRAVENOUS
  Filled 2014-11-01: qty 40

## 2014-11-01 MED ORDER — DEXAMETHASONE SODIUM PHOSPHATE 20 MG/5ML IJ SOLN
20.0000 mg | Freq: Once | INTRAMUSCULAR | Status: AC
  Administered 2014-11-01: 20 mg via INTRAVENOUS

## 2014-11-01 MED ORDER — ONDANSETRON 16 MG/50ML IVPB (CHCC)
16.0000 mg | Freq: Once | INTRAVENOUS | Status: AC
  Administered 2014-11-01: 16 mg via INTRAVENOUS

## 2014-11-01 MED ORDER — SODIUM CHLORIDE 0.9 % IV SOLN
Freq: Once | INTRAVENOUS | Status: AC
  Administered 2014-11-01: 10:00:00 via INTRAVENOUS

## 2014-11-01 MED ORDER — CARBOPLATIN CHEMO INJECTION 600 MG/60ML
504.5000 mg | Freq: Once | INTRAVENOUS | Status: AC
  Administered 2014-11-01: 500 mg via INTRAVENOUS
  Filled 2014-11-01: qty 50

## 2014-11-01 MED ORDER — DEXAMETHASONE SODIUM PHOSPHATE 20 MG/5ML IJ SOLN
INTRAMUSCULAR | Status: AC
  Filled 2014-11-01: qty 5

## 2014-11-01 MED ORDER — ONDANSETRON 16 MG/50ML IVPB (CHCC)
INTRAVENOUS | Status: AC
  Filled 2014-11-01: qty 16

## 2014-11-01 NOTE — Patient Instructions (Signed)
[facility] Discharge Instructions for Patients Receiving Chemotherapy  Today you received the following chemotherapy agents abraxane/carboplatin.   To help prevent nausea and vomiting after your treatment, we encourage you to take your nausea medication as directed   If you develop nausea and vomiting that is not controlled by your nausea medication, call the clinic.   BELOW ARE SYMPTOMS THAT SHOULD BE REPORTED IMMEDIATELY:  *FEVER GREATER THAN 100.5 F  *CHILLS WITH OR WITHOUT FEVER  NAUSEA AND VOMITING THAT IS NOT CONTROLLED WITH YOUR NAUSEA MEDICATION  *UNUSUAL SHORTNESS OF BREATH  *UNUSUAL BRUISING OR BLEEDING  TENDERNESS IN MOUTH AND THROAT WITH OR WITHOUT PRESENCE OF ULCERS  *URINARY PROBLEMS  *BOWEL PROBLEMS  UNUSUAL RASH Items with * indicate a potential emergency and should be followed up as soon as possible.  Feel free to call the clinic you have any questions or concerns. The clinic phone number is [phone].

## 2014-11-01 NOTE — Telephone Encounter (Signed)
Per pts pharmacist, Restoril last filled 2014. Pt cannot remember if he took Restoril. Per Julien Nordmann I called in rx for restoril and pt notified.

## 2014-11-08 ENCOUNTER — Other Ambulatory Visit (HOSPITAL_BASED_OUTPATIENT_CLINIC_OR_DEPARTMENT_OTHER): Payer: BLUE CROSS/BLUE SHIELD

## 2014-11-08 ENCOUNTER — Ambulatory Visit (HOSPITAL_BASED_OUTPATIENT_CLINIC_OR_DEPARTMENT_OTHER): Payer: BLUE CROSS/BLUE SHIELD

## 2014-11-08 ENCOUNTER — Ambulatory Visit (HOSPITAL_BASED_OUTPATIENT_CLINIC_OR_DEPARTMENT_OTHER): Payer: BLUE CROSS/BLUE SHIELD | Admitting: Physician Assistant

## 2014-11-08 ENCOUNTER — Telehealth: Payer: Self-pay | Admitting: Internal Medicine

## 2014-11-08 ENCOUNTER — Other Ambulatory Visit: Payer: BC Managed Care – PPO

## 2014-11-08 ENCOUNTER — Encounter: Payer: Self-pay | Admitting: Physician Assistant

## 2014-11-08 VITALS — BP 123/71 | HR 107 | Temp 97.6°F | Resp 18 | Ht 68.0 in | Wt 155.7 lb

## 2014-11-08 DIAGNOSIS — C3491 Malignant neoplasm of unspecified part of right bronchus or lung: Secondary | ICD-10-CM

## 2014-11-08 DIAGNOSIS — C3431 Malignant neoplasm of lower lobe, right bronchus or lung: Secondary | ICD-10-CM

## 2014-11-08 DIAGNOSIS — C3411 Malignant neoplasm of upper lobe, right bronchus or lung: Secondary | ICD-10-CM

## 2014-11-08 DIAGNOSIS — Z5111 Encounter for antineoplastic chemotherapy: Secondary | ICD-10-CM

## 2014-11-08 LAB — CBC WITH DIFFERENTIAL/PLATELET
BASO%: 1.1 % (ref 0.0–2.0)
BASOS ABS: 0 10*3/uL (ref 0.0–0.1)
EOS%: 0.3 % (ref 0.0–7.0)
Eosinophils Absolute: 0 10*3/uL (ref 0.0–0.5)
HEMATOCRIT: 29 % — AB (ref 38.4–49.9)
HEMOGLOBIN: 9.8 g/dL — AB (ref 13.0–17.1)
LYMPH#: 0.7 10*3/uL — AB (ref 0.9–3.3)
LYMPH%: 17.1 % (ref 14.0–49.0)
MCH: 30.3 pg (ref 27.2–33.4)
MCHC: 33.8 g/dL (ref 32.0–36.0)
MCV: 89.7 fL (ref 79.3–98.0)
MONO#: 0.4 10*3/uL (ref 0.1–0.9)
MONO%: 8.7 % (ref 0.0–14.0)
NEUT#: 3.1 10*3/uL (ref 1.5–6.5)
NEUT%: 72.8 % (ref 39.0–75.0)
PLATELETS: 228 10*3/uL (ref 140–400)
RBC: 3.24 10*6/uL — ABNORMAL LOW (ref 4.20–5.82)
RDW: 22.1 % — ABNORMAL HIGH (ref 11.0–14.6)
WBC: 4.3 10*3/uL (ref 4.0–10.3)

## 2014-11-08 LAB — COMPREHENSIVE METABOLIC PANEL (CC13)
ALT: 16 U/L (ref 0–55)
ANION GAP: 11 meq/L (ref 3–11)
AST: 13 U/L (ref 5–34)
Albumin: 3.6 g/dL (ref 3.5–5.0)
Alkaline Phosphatase: 77 U/L (ref 40–150)
BUN: 19.7 mg/dL (ref 7.0–26.0)
CALCIUM: 9.4 mg/dL (ref 8.4–10.4)
CO2: 21 meq/L — AB (ref 22–29)
CREATININE: 1 mg/dL (ref 0.7–1.3)
Chloride: 104 mEq/L (ref 98–109)
Glucose: 195 mg/dl — ABNORMAL HIGH (ref 70–140)
Potassium: 3.4 mEq/L — ABNORMAL LOW (ref 3.5–5.1)
Sodium: 136 mEq/L (ref 136–145)
Total Bilirubin: 0.73 mg/dL (ref 0.20–1.20)
Total Protein: 6.5 g/dL (ref 6.4–8.3)

## 2014-11-08 MED ORDER — ONDANSETRON 8 MG/NS 50 ML IVPB
INTRAVENOUS | Status: AC
  Filled 2014-11-08: qty 8

## 2014-11-08 MED ORDER — ONDANSETRON 8 MG/50ML IVPB (CHCC)
8.0000 mg | Freq: Once | INTRAVENOUS | Status: AC
  Administered 2014-11-08: 8 mg via INTRAVENOUS

## 2014-11-08 MED ORDER — HYDROCODONE-ACETAMINOPHEN 5-325 MG PO TABS: 1.0000 | ORAL_TABLET | Freq: Four times a day (QID) | ORAL | Status: DC | PRN

## 2014-11-08 MED ORDER — DEXAMETHASONE SODIUM PHOSPHATE 10 MG/ML IJ SOLN
INTRAMUSCULAR | Status: AC
  Filled 2014-11-08: qty 1

## 2014-11-08 MED ORDER — LORAZEPAM 0.5 MG PO TABS: ORAL_TABLET | ORAL | Status: DC

## 2014-11-08 MED ORDER — PACLITAXEL PROTEIN-BOUND CHEMO INJECTION 100 MG
100.0000 mg/m2 | Freq: Once | INTRAVENOUS | Status: AC
  Administered 2014-11-08: 200 mg via INTRAVENOUS
  Filled 2014-11-08: qty 40

## 2014-11-08 MED ORDER — SODIUM CHLORIDE 0.9 % IV SOLN
Freq: Once | INTRAVENOUS | Status: AC
  Administered 2014-11-08: 11:00:00 via INTRAVENOUS

## 2014-11-08 MED ORDER — DEXAMETHASONE SODIUM PHOSPHATE 10 MG/ML IJ SOLN
10.0000 mg | Freq: Once | INTRAMUSCULAR | Status: AC
  Administered 2014-11-08: 10 mg via INTRAVENOUS

## 2014-11-08 NOTE — Progress Notes (Addendum)
[facility] [phone]   [phone]  SHARED VISIT PROGRESS NOTE  [name], MD [place] [address]  DIAGNOSIS: Stage IIIB (T3, N3, M0) non-small cell lung cancer, squamous cell carcinoma presenting with right upper lobe obstructing mass as well as mediastinal and right supraclavicular lymphadenopathy diagnosed in August of 2015.  PRIOR THERAPY: status post palliative radiotherapy to the obstructing lung mass in the right upper lobe under the care of Dr. [name]  completed on 08/23/2014.  CURRENT THERAPY: 1)  systemic chemotherapy with carboplatin for AUC of 5 on day 1 and Abraxane 100 mg/M2 on days 1, 8 and 15 every 3 weeks. First dose 08/30/2014. Status post 3 cycles, as well as day 1 of cycle #4.  INTERVAL HISTORY: [name] Cart 68 y.o. male returns to the clinic today for followup visit accompanied by his friend.  He is status post 3 cycles as well as day 1 of cycle #4 of his systemic chemotherapy with carboplatin and Abraxane. He reports anxiety and is taking his Ativan 2 to 3 times daily, mostly 3 times daily.  He presents to proceed with day 8 cycle #4. He also reports some fatigue and generalized malaise. His appetite has improved slightly. He continues to have shortness of breath at baseline and increased with exertion. He requests refill prescriptions for his hydrocodone tablets and Ativan. He denied having any significant chest pain. He has no fever or chills, no nausea or vomiting.   MEDICAL HISTORY: Past Medical History  Diagnosis Date  . DM (diabetes mellitus)   . Pneumonia   . Hypercholesteremia   . Melanoma   . Radiation 08/03/14-08/23/14    35 gray to right chest  . Lung cancer     ALLERGIES:  has No Known Allergies.  MEDICATIONS:  Current Outpatient Prescriptions  Medication Sig Dispense Refill  . atorvastatin (LIPITOR) 80 MG tablet Take 80 mg by mouth daily.    .  budesonide-formoterol (SYMBICORT) 160-4.5 MCG/ACT inhaler Take 2 puffs first thing in am and then another 2 puffs about 12 hours later. 1 Inhaler 3  . Cholecalciferol (VITAMIN D3 PO) Take 10,000 Units by mouth daily.    . Cinnamon 500 MG capsule Take 500 mg by mouth daily.    . Dapagliflozin Propanediol (FARXIGA) 5 MG TABS Take 5 mg by mouth 2 (two) times daily.    [name] glimepiride (AMARYL) 4 MG tablet Take 4 mg by mouth 2 (two) times daily. Take before meals    . hyaluronate sodium (RADIAPLEXRX) GEL Apply 1 application topically once.    [name] LORazepam (ATIVAN) 0.5 MG tablet Take 1 table by mouth every 8 to 12 hours as needed for anxiety 40 tablet 0  . metFORMIN (GLUCOPHAGE) 500 MG tablet Take 500 mg by mouth 2 (two) times daily with a meal.    . omeprazole (PRILOSEC) 20 MG capsule Take 20 mg by mouth daily.    . potassium chloride SA (K-DUR,KLOR-CON) 20 MEQ tablet Take 1 tablet (20 mEq total) by mouth daily. 20 tablet 0  . prochlorperazine (COMPAZINE) 10 MG tablet Take 1 tablet (10 mg total) by mouth every 6 (six) hours as needed for nausea or vomiting. 30 tablet 1  . temazepam (RESTORIL) 15 MG capsule Take 1 capsule (15 mg total) by mouth at bedtime as needed for sleep. 20 capsule 0  . HYDROcodone-acetaminophen (NORCO) 5-325 MG per tablet Take 1 tablet by mouth every 6 (six)  hours as needed for moderate pain. 30 tablet 0  . potassium chloride (K-DUR) 10 MEQ tablet Take 2 tablets (20 mEq total) by mouth 2 (two) times daily. 12 tablet 0  . potassium chloride SA (K-DUR,KLOR-CON) 20 MEQ tablet Take 1 tablet (20 mEq total) by mouth daily. (Patient not taking: Reported on 11/08/2014) 7 tablet 0   No current facility-administered medications for this visit.    SURGICAL HISTORY:  Past Surgical History  Procedure Laterality Date  . Video bronchoscopy Bilateral 07/20/2014    Procedure: VIDEO BRONCHOSCOPY WITHOUT FLUORO;  Surgeon: Tanda Rockers, MD;  Location: [facility];  Service: Cardiopulmonary;   Laterality: Bilateral;    REVIEW OF SYSTEMS:  Constitutional: positive for fatigue and malaise Eyes: negative Ears, nose, mouth, throat, and face: negative Respiratory: positive for dyspnea on exertion Cardiovascular: negative Gastrointestinal: negative Genitourinary:negative Integument/breast: negative Hematologic/lymphatic: negative Musculoskeletal:negative Neurological: negative Behavioral/Psych: positive for anxiety Endocrine: negative Allergic/Immunologic: negative   PHYSICAL EXAMINATION: General appearance: alert, cooperative, fatigued and no distress Head: Normocephalic, without obvious abnormality, atraumatic Neck: no adenopathy, no JVD, supple, symmetrical, trachea midline and thyroid not enlarged, symmetric, no tenderness/mass/nodules Lymph nodes: Cervical, supraclavicular, and axillary nodes normal. Resp: rales RML and RUL and wheezes RML and RUL Back: symmetric, no curvature. ROM normal. No CVA tenderness. Cardio: regular rate and rhythm, S1, S2 normal, no murmur, click, rub or gallop GI: soft, non-tender; bowel sounds normal; no masses,  no organomegaly Extremities: extremities normal, atraumatic, no cyanosis or edema Neurologic: Alert and oriented X 3, normal strength and tone. Normal symmetric reflexes. Normal coordination and gait Skin: dry and cracked in general with a raw area in the web space between the second and third fingers, no evidence of infection  ECOG PERFORMANCE STATUS: 1 - Symptomatic but completely ambulatory  Blood pressure 123/71, pulse 107, temperature 97.6 F (36.4 C), temperature source Oral, resp. rate 18, height 5\' 8"  (1.727 m), weight 155 lb 11.2 oz (70.625 kg), SpO2 99 %.  LABORATORY DATA: Lab Results  Component Value Date   WBC 4.3 11/08/2014   HGB 9.8* 11/08/2014   HCT 29.0* 11/08/2014   MCV 89.7 11/08/2014   PLT 228 11/08/2014      Chemistry      Component Value Date/Time   NA 136 11/08/2014 0933   NA 140 10/28/2014 0202   K  3.4* 11/08/2014 0933   K 3.2* 10/28/2014 0202   CL 102 10/28/2014 0202   CO2 21* 11/08/2014 0933   CO2 22 10/23/2014 0415   BUN 19.7 11/08/2014 0933   BUN 10 10/28/2014 0202   CREATININE 1.0 11/08/2014 0933   CREATININE 0.80 10/28/2014 0202      Component Value Date/Time   CALCIUM 9.4 11/08/2014 0933   CALCIUM 8.5 10/23/2014 0415   ALKPHOS 77 11/08/2014 0933   ALKPHOS 77 10/04/2014 0904   AST 13 11/08/2014 0933   AST 13 10/04/2014 0904   ALT 16 11/08/2014 0933   ALT 17 10/04/2014 0904   BILITOT 0.73 11/08/2014 0933   BILITOT 0.4 10/04/2014 0904       RADIOGRAPHIC STUDIES: Dg Eye Foreign Body  08/01/2014   CLINICAL DATA:  Implanted hearing aids.  Pre MRI evaluation.  EXAM: ORBITS FOR FOREIGN BODY - 2 VIEW  COMPARISON:  Sinus CT 07/17/2014  FINDINGS: Evidence for bilateral electronic hearing aid devices. No radiopaque foreign bodies or metallic structures in the region of the orbits. Small metallic structures associated with the maxilla.  IMPRESSION: No evidence of metallic foreign body within the  orbits.  Bilateral hearing aid devices.   Electronically Signed   By: Markus Daft M.D.   On: 08/01/2014 11:59   Dg Chest 2 View  07/31/2014   CLINICAL DATA:  Cough.  Short of breath.  Fever.  EXAM: CHEST  2 VIEW  COMPARISON:  PET-CT, 07/26/2014.  Chest radiograph, 07/11/2014.  FINDINGS: Dense consolidation in the right upper lobe reflects postobstructive pneumonitis. Lobe could be secondarily infected. Mild reticular lung base opacity, mostly on the left, is likely subsegmental atelectasis. No other areas of lung consolidation. No pleural effusion.  Known right hilar mass is obscured by the contiguous right upper lobe consolidation. No left hilar mass. Mild thickening along the right peritracheal stripe. Cardiac silhouette is normal in size.  Bony thorax is intact.  IMPRESSION: 1. Dense consolidation in the right upper lobe reflects post obstruction change, likely postobstructive pneumonitis.  Secondary pneumonia is possible. The extent of the consolidation is similar to the recent PET-CT. 2. No other areas of lung consolidation. No pulmonary edema. Mild basilar subsegmental atelectasis. No pleural effusion.   Electronically Signed   By: Lajean Manes M.D.   On: 07/31/2014 13:09   Mr Jeri Cos ZO Contrast  08/01/2014   CLINICAL DATA:  68 year old male with confusion and recent diagnosis of lung cancer. Staging. Subsequent encounter.  EXAM: MRI HEAD WITHOUT AND WITH CONTRAST  TECHNIQUE: Multiplanar, multiecho pulse sequences of the brain and surrounding structures were obtained without and with intravenous contrast.  CONTRAST:  76mL MULTIHANCE GADOBENATE DIMEGLUMINE 529 MG/ML IV SOLN  COMPARISON:  Paranasal sinus CT 07/17/2014.  FINDINGS: No abnormal enhancement identified. No midline shift, mass effect, or evidence of intracranial mass lesion.  Susceptibility artifact related to dental hardware, mildly degrades some portions of this exam. Cerebral volume is within normal limits for age. No restricted diffusion to suggest acute infarction. No ventriculomegaly, extra-axial collection or acute intracranial hemorrhage. Cervicomedullary junction and pituitary are within normal limits. Major intracranial vascular flow voids are preserved, dominant distal left vertebral artery. Pearline Cables and white matter signal is within normal limits for age throughout the brain.  Degenerative disc and endplate degeneration at C3-C4 resulting in cervical spinal stenosis, appears to be mild.  Visualized bone marrow signal is within normal limits. Visible internal auditory structures appear normal. Trace inferior right mastoid effusion. Negative visible paranasal sinuses. Postoperative changes to the globes. Visualized scalp soft tissues are within normal limits.  IMPRESSION: No acute or metastatic intracranial abnormality.   Electronically Signed   By: Lars Pinks M.D.   On: 08/01/2014 20:29   Nm Pet Image Initial (pi) Skull Base To  Thigh  07/26/2014   CLINICAL DATA:  Initial treatment strategy for lung nodule.  EXAM: NUCLEAR MEDICINE PET SKULL BASE TO THIGH  TECHNIQUE: 8.5 mCi F-18 FDG was injected intravenously. Full-ring PET imaging was performed from the skull base to thigh after the radiotracer. CT data was obtained and used for attenuation correction and anatomic localization.  FASTING BLOOD GLUCOSE:  Value: 151 mg/dl  COMPARISON:  CT chest 07/17/2014.  FINDINGS: NECK  No hypermetabolic lymph nodes in the neck. CT images show no acute findings.  CHEST  A right supraclavicular lymph node measures 9 mm (CT image 42) with an SUV max of 2.8. Mediastinal lymph nodes measure up to 12 mm in the low right paratracheal station with an SUV max of 3.8. Right hilar mass was better measured on 07/17/2014 and has an SUV max of 7.2.  When coronal images from diagnostic examination performed 07/17/2014  are reviewed, the right hilar mass appears to come within 1.7 cm of the carina. Right upper lobe bronchus is completely obstructed with marked progression of postobstructive consolidation and pneumonitis in the right upper lobe, which has an SUV max of 9.2 anteriorly. There is narrowing of the bronchus intermedius and right lower lobe bronchus as well, with new postobstructive pneumonitis in the right lower lobe.  CT images show atherosclerotic calcification of the arterial vasculature, including three-vessel involvement of the coronary arteries. Heart size normal. Tiny amount of pericardial fluid may be physiologic. No pleural fluid. Respiratory motion degrades image quality upon review of the lung windows. There is persistent ground-glass airspace disease in the left lower lobe. Nodular consolidation in the left lower lobe (series 8, image 57) is seen as well.  ABDOMEN/PELVIS  No abnormal hypermetabolism within the liver, adrenal glands, spleen or pancreas. No hypermetabolic lymph nodes.  CT images show the liver to be grossly unremarkable. A large  stone fills the fundus of the gallbladder, measuring approximately 3.6 cm. A 1.6 cm low density nodule in the right adrenal gland is stable. Left adrenal gland is unremarkable. Low-attenuation lesions in the kidneys measure up to 1.5 cm on the right and are difficult to definitively characterize without post-contrast imaging. Tiny stones in the right kidney. Spleen, pancreas, stomach and bowel are grossly unremarkable.  Atherosclerotic calcification of the arterial vasculature without abdominal aortic aneurysm. Small bilateral inguinal hernias contain fat. No free fluid.  SKELETON  No abnormal osseous hypermetabolism.  IMPRESSION: 1. Hypermetabolic right hilar mass with hypermetabolic ipsilateral mediastinal and right supraclavicular lymph nodes, most consistent with primary bronchogenic carcinoma. Given apparent distance of less than 2 cm from the carina, findings are worrisome for T3 N3 M0 or stage IIIB disease. 2. Complete obstruction of the right upper lobe bronchus with marked narrowing of the bronchus intermedius and right lower lobe bronchus. Progressive postobstructive pneumonitis in the right upper lobe with new postobstructive changes in the right lower lobe. 3. Persistent ground-glass and nodular airspace disease in the left lower lobe, better imaged on 07/17/2014. 4. Tiny amount of pericardial fluid may be physiologic. 5. Three-vessel coronary artery calcification. 6. Right adrenal adenoma. 7. Tiny right renal stones. 8. Small bilateral inguinal hernias contain fat.   Electronically Signed   By: Lorin Picket M.D.   On: 07/26/2014 09:16    ASSESSMENT AND PLAN: This is a very pleasant 68 years old white male recently diagnosed with a stage IIIB non-small cell lung cancer status post a short course of palliative radiotherapy to the obstructing mass in the right upper lobe. He is currently being treated with systemic chemotherapy with carboplatin for AUC of 5 on day 1 and Abraxane 100 mg/M2 on days 1,  8 and 15 every 3 weeks. He is status post 3 cycles.The patient was discussed with and also seen by Dr. Julien Nordmann. His recent CT scan showed interval improvement in his disease. He will proceed with cycle  #4 as scheduled. He will return in 2 weeks for another symptom management visit at the start of cycle #5. He was given refill prescriptions for his hydrocodone tablets ( 5/325 mg #30 x 0 refills) and Ativan (0.5 mg #40 x 0 refills) tablets.  He was advised to call immediately if he has any concerning symptoms in the interval. The patient voices understanding of current disease status and treatment options and is in agreement with the current care plan.  All questions were answered. The patient knows to call the clinic  with any problems, questions or concerns. We can certainly see the patient much sooner if necessary.  Disclaimer: This note was dictated with voice recognition software. Similar sounding words can inadvertently be transcribed and may not be corrected upon review.  Carlton Adam, PA-C 11/08/2014  ADDENDUM: Hematology/Oncology Attending: I had a face to face encounter with the patient. I recommended his care plan. This is a very pleasant 68 years old white male with a stage IIIB non-small cell lung cancer, squamous cell carcinoma. The patient is currently undergoing systemic chemotherapy with carboplatin and Abraxane and currently undergoing cycle #4. The most recent CT scan of the chest showed evidence for disease improvement. I discussed the scan results with the patient and his girlfriend today. I recommended for him to continue his current treatment with carboplatin and Abraxane as scheduled. He would come back for follow-up visit in 2 weeks with the start of cycle #5. He was given refill of hydrocodone and Ativan. The patient was advised to call immediately if he has any concerning symptoms in the interval.  Disclaimer: This note was dictated with voice recognition software.  Similar sounding words can inadvertently be transcribed and may be missed upon review. Eilleen Kempf., MD 11/09/2014

## 2014-11-08 NOTE — Patient Instructions (Signed)
Continue with labs and chemotherapy as scheduled Follow up in 2 weeks for another symptom management visit

## 2014-11-08 NOTE — Patient Instructions (Signed)
[facility] Discharge Instructions for Patients Receiving Chemotherapy  Today you received the following chemotherapy agents Abraxane.  To help prevent nausea and vomiting after your treatment, we encourage you to take your nausea medication as prescribed.   If you develop nausea and vomiting that is not controlled by your nausea medication, call the clinic.   BELOW ARE SYMPTOMS THAT SHOULD BE REPORTED IMMEDIATELY:  *FEVER GREATER THAN 100.5 F  *CHILLS WITH OR WITHOUT FEVER  NAUSEA AND VOMITING THAT IS NOT CONTROLLED WITH YOUR NAUSEA MEDICATION  *UNUSUAL SHORTNESS OF BREATH  *UNUSUAL BRUISING OR BLEEDING  TENDERNESS IN MOUTH AND THROAT WITH OR WITHOUT PRESENCE OF ULCERS  *URINARY PROBLEMS  *BOWEL PROBLEMS  UNUSUAL RASH Items with * indicate a potential emergency and should be followed up as soon as possible.  Feel free to call the clinic you have any questions or concerns. The clinic phone number is [phone].

## 2014-11-08 NOTE — Telephone Encounter (Signed)
lvm for pt regarding to DEc appts....mailed pt appt sched/avs and letter

## 2014-11-15 ENCOUNTER — Ambulatory Visit: Payer: BC Managed Care – PPO | Admitting: Physician Assistant

## 2014-11-15 ENCOUNTER — Other Ambulatory Visit (HOSPITAL_BASED_OUTPATIENT_CLINIC_OR_DEPARTMENT_OTHER): Payer: Medicare Other

## 2014-11-15 ENCOUNTER — Telehealth: Payer: Self-pay | Admitting: *Deleted

## 2014-11-15 ENCOUNTER — Ambulatory Visit (HOSPITAL_BASED_OUTPATIENT_CLINIC_OR_DEPARTMENT_OTHER): Payer: Medicare Other

## 2014-11-15 DIAGNOSIS — C3411 Malignant neoplasm of upper lobe, right bronchus or lung: Secondary | ICD-10-CM

## 2014-11-15 DIAGNOSIS — Z5111 Encounter for antineoplastic chemotherapy: Secondary | ICD-10-CM

## 2014-11-15 DIAGNOSIS — C3491 Malignant neoplasm of unspecified part of right bronchus or lung: Secondary | ICD-10-CM

## 2014-11-15 LAB — COMPREHENSIVE METABOLIC PANEL (CC13)
ALT: 19 U/L (ref 0–55)
AST: 15 U/L (ref 5–34)
Albumin: 3.4 g/dL — ABNORMAL LOW (ref 3.5–5.0)
Alkaline Phosphatase: 92 U/L (ref 40–150)
Anion Gap: 12 mEq/L — ABNORMAL HIGH (ref 3–11)
BUN: 14.4 mg/dL (ref 7.0–26.0)
CALCIUM: 9.3 mg/dL (ref 8.4–10.4)
CHLORIDE: 101 meq/L (ref 98–109)
CO2: 25 mEq/L (ref 22–29)
CREATININE: 0.9 mg/dL (ref 0.7–1.3)
Glucose: 122 mg/dl (ref 70–140)
Potassium: 3.1 mEq/L — ABNORMAL LOW (ref 3.5–5.1)
SODIUM: 139 meq/L (ref 136–145)
Total Bilirubin: 0.65 mg/dL (ref 0.20–1.20)
Total Protein: 6.5 g/dL (ref 6.4–8.3)

## 2014-11-15 LAB — CBC WITH DIFFERENTIAL/PLATELET
BASO%: 0.8 % (ref 0.0–2.0)
Basophils Absolute: 0 10*3/uL (ref 0.0–0.1)
EOS%: 0.2 % (ref 0.0–7.0)
Eosinophils Absolute: 0 10*3/uL (ref 0.0–0.5)
HEMATOCRIT: 29.8 % — AB (ref 38.4–49.9)
HGB: 9.9 g/dL — ABNORMAL LOW (ref 13.0–17.1)
LYMPH#: 0.9 10*3/uL (ref 0.9–3.3)
LYMPH%: 20.5 % (ref 14.0–49.0)
MCH: 30.7 pg (ref 27.2–33.4)
MCHC: 33.3 g/dL (ref 32.0–36.0)
MCV: 92.3 fL (ref 79.3–98.0)
MONO#: 0.4 10*3/uL (ref 0.1–0.9)
MONO%: 10.3 % (ref 0.0–14.0)
NEUT#: 2.9 10*3/uL (ref 1.5–6.5)
NEUT%: 68.2 % (ref 39.0–75.0)
Platelets: 210 10*3/uL (ref 140–400)
RBC: 3.23 10*6/uL — ABNORMAL LOW (ref 4.20–5.82)
RDW: 22.9 % — AB (ref 11.0–14.6)
WBC: 4.3 10*3/uL (ref 4.0–10.3)

## 2014-11-15 MED ORDER — ONDANSETRON 8 MG/50ML IVPB (CHCC)
8.0000 mg | Freq: Once | INTRAVENOUS | Status: AC
  Administered 2014-11-15: 8 mg via INTRAVENOUS

## 2014-11-15 MED ORDER — DEXAMETHASONE SODIUM PHOSPHATE 10 MG/ML IJ SOLN
10.0000 mg | Freq: Once | INTRAMUSCULAR | Status: AC
  Administered 2014-11-15: 10 mg via INTRAVENOUS

## 2014-11-15 MED ORDER — DEXAMETHASONE SODIUM PHOSPHATE 10 MG/ML IJ SOLN
INTRAMUSCULAR | Status: AC
  Filled 2014-11-15: qty 1

## 2014-11-15 MED ORDER — ONDANSETRON 8 MG/NS 50 ML IVPB
INTRAVENOUS | Status: AC
  Filled 2014-11-15: qty 8

## 2014-11-15 MED ORDER — SODIUM CHLORIDE 0.9 % IV SOLN
Freq: Once | INTRAVENOUS | Status: AC
  Administered 2014-11-15: 10:00:00 via INTRAVENOUS

## 2014-11-15 MED ORDER — PACLITAXEL PROTEIN-BOUND CHEMO INJECTION 100 MG
100.0000 mg/m2 | Freq: Once | INTRAVENOUS | Status: AC
  Administered 2014-11-15: 200 mg via INTRAVENOUS
  Filled 2014-11-15: qty 40

## 2014-11-15 NOTE — Telephone Encounter (Signed)
Pt already takes 60meq daily of K.  Per Dr Vista Mink okay to take 83meq daily x 7 days then resume 86meq.  Infusion RN Mayra Reel will inform pt.

## 2014-11-15 NOTE — Patient Instructions (Addendum)
[facility] Discharge Instructions for Patients Receiving Chemotherapy  Today you received the following chemotherapy agents Abraxane.  To help prevent nausea and vomiting after your treatment, we encourage you to take your nausea medication as prescribed.   If you develop nausea and vomiting that is not controlled by your nausea medication, call the clinic.   BELOW ARE SYMPTOMS THAT SHOULD BE REPORTED IMMEDIATELY:  *FEVER GREATER THAN 100.5 F  *CHILLS WITH OR WITHOUT FEVER  NAUSEA AND VOMITING THAT IS NOT CONTROLLED WITH YOUR NAUSEA MEDICATION  *UNUSUAL SHORTNESS OF BREATH  *UNUSUAL BRUISING OR BLEEDING  TENDERNESS IN MOUTH AND THROAT WITH OR WITHOUT PRESENCE OF ULCERS  *URINARY PROBLEMS  *BOWEL PROBLEMS  UNUSUAL RASH Items with * indicate a potential emergency and should be followed up as soon as possible.  Feel free to call the clinic you have any questions or concerns. The clinic phone number is [phone].   Hypokalemia Hypokalemia means that the amount of potassium in the blood is lower than normal.Potassium is a chemical, called an electrolyte, that helps regulate the amount of fluid in the body. It also stimulates muscle contraction and helps nerves function properly.Most of the body's potassium is inside of cells, and only a very small amount is in the blood. Because the amount in the blood is so small, minor changes can be life-threatening. CAUSES  Antibiotics.  Diarrhea or vomiting.  Using laxatives too much, which can cause diarrhea.  Chronic kidney disease.  Water pills (diuretics).  Eating disorders (bulimia).  Low magnesium level.  Sweating a lot. SIGNS AND SYMPTOMS  Weakness.  Constipation.  Fatigue.  Muscle cramps.  Mental confusion.  Skipped heartbeats or irregular heartbeat (palpitations).  Tingling or numbness. DIAGNOSIS  Your health care provider can diagnose hypokalemia with blood tests. In addition to  checking your potassium level, your health care provider may also check other lab tests. TREATMENT Hypokalemia can be treated with potassium supplements taken by mouth or adjustments in your current medicines. If your potassium level is very low, you may need to get potassium through a vein (IV) and be monitored in the hospital. A diet high in potassium is also helpful. Foods high in potassium are:  Nuts, such as peanuts and pistachios.  Seeds, such as sunflower seeds and pumpkin seeds.  Peas, lentils, and lima beans.  Whole grain and bran cereals and breads.  Fresh fruit and vegetables, such as apricots, avocado, bananas, cantaloupe, kiwi, oranges, tomatoes, asparagus, and potatoes.  Orange and tomato juices.  Red meats.  Fruit yogurt. HOME CARE INSTRUCTIONS  Take all medicines as prescribed by your health care provider.  Maintain a healthy diet by including nutritious food, such as fruits, vegetables, nuts, whole grains, and lean meats.  If you are taking a laxative, be sure to follow the directions on the label. SEEK MEDICAL CARE IF:  Your weakness gets worse.  You feel your heart pounding or racing.  You are vomiting or having diarrhea.  You are diabetic and having trouble keeping your blood glucose in the normal range. SEEK IMMEDIATE MEDICAL CARE IF:  You have chest pain, shortness of breath, or dizziness.  You are vomiting or having diarrhea for more than 2 days.  You faint. MAKE SURE YOU:   Understand these instructions.  Will watch your condition.  Will get help right away if you are not doing well or get worse. Document Released: 12/01/2005 Document Revised: 09/21/2013 Document Reviewed: 06/03/2013 [facility] Patient Information 2015 [place], [place]. This information  is not intended to replace advice given to you by your health care provider. Make sure you discuss any questions you have with your health care provider.  Increase K DUR tabs to   1 tab by mouth,  twice a day for  7 days, then resume one tab by mouth every dayPotassium Salts tablets, extended-release tablets or capsules Qu es este medicamento? El POTASIO es una sal natural importante para el funcionamiento normal del corazn, los msculos y los nervios. Se encuentra en muchos alimentos y generalmente se suministra a travs de [place]. Este medicamento se [place] para tratar niveles bajos de potasio. Este medicamento puede ser utilizado para otros usos; si tiene alguna pregunta consulte con su proveedor de atencin mdica o con su farmacutico. MARCAS COMERCIALES DISPONIBLES: ED-K+10, Glu-K, K-10, K-8, K-Dur, K-Tab, Kaon-CL, Klor-Con, Klor-Con M10, Klor-Con M15, Klor-Con M20, Klotrix, Micro-K, Micro-K Extencaps, Slow-K Qu le debo informar a mi profesional de la salud antes de tomar este medicamento? Necesita saber si usted presenta alguno de los siguientes problemas o situaciones: -deshidratacin -diarrea -latidos cardiacos irregulares -enfermedad renal -lceras estomacales u otros problemas estomacales -una reaccin alrgica o inusual a las sales de potasio, a otros medicamentos, alimentos, Scientist, water quality o conservantes -si est embarazada o buscando quedar embarazada -si est amamantando a un beb Cmo debo Insurance account manager medicamento? Tome este medicamento por va oral con un vaso lleno de agua. Siga las instrucciones de la etiqueta del [place]. Tomar con alimentos. No chupe, triture ni [employer]. Si tiene dificultad para tragarlo, pregunte a su farmacutico cmo lo puede tomar. Tome sus dosis a intervalos regulares. No tome su medicamento con una frecuencia mayor a la indicada. No deje de tomarlo excepto si as lo indica su mdico. Hable con su pediatra para informarse acerca del uso de este medicamento en nios. Puede requerir atencin especial. Sobredosis: Pngase en contacto inmediatamente con un centro toxicolgico o una sala de urgencia si usted cree  que haya tomado demasiado medicamento. ATENCIN: [employer] es solo para usted. No comparta este medicamento con nadie. Qu sucede si me olvido de una dosis? Si olvida una dosis, tmela lo antes posible. Si es casi la hora de la prxima dosis, tome slo esa dosis. No tome dosis adicionales o dobles. Qu puede interactuar con este medicamento? No tome esta medicina con ninguno de los siguientes medicamentos: -eplerenona -sulfonato sdico de poliestireno Esta medicina tambin puede interactuar con los siguientes medicamentos: -medicamentos para la alta presin sangunea o enfermedad cardiaca, como lisinopril, losartn, quinapril, valsartn -medicamentos para resfros o alergias -medicamentos para la inflamacin, tales como ibuprofeno, indometacina -medicamentos para la enfermedad de Parkinson -medicamentos para el estmago, tales como metoclopramida, diciclomina, glucopirrolato -algunos diurticos Puede ser que esta lista no menciona todas las posibles interacciones. Informe a su profesional de [employer]	[place] de [employer] productos a base de hierbas, medicamentos de [place] o suplementos nutritivos que est tomando. Si usted fuma, consume bebidas alcohlicas o si utiliza drogas ilegales, indqueselo tambin a su profesional de [employer]	[place]. Algunas sustancias pueden interactuar con su medicamento. A qu debo estar atento al usar [employer]? Visite a su mdico o a su profesional de la salud para chequear su evolucin peridicamente. Necesitar realizarse anlisis de laboratorio regularmente. Puede ser necesario seguir una dieta especial mientras est tomando este medicamento. Consulte a su mdico acerca de esto. Qu efectos secundarios puedo tener al [employer] este medicamento? Efectos secundarios que debe informar a su mdico o a Barrister's clerk de la salud tan pronto  como sea posible: -reacciones alrgicas como erupcin cutnea, picazn o urticarias, hinchazn de la cara, labios o  lengua -heces de color oscuro o con aspecto alquitranado -acidez de estmago -pulso cardiaco irregular -hormigueo o entumecimiento de manos o pies -dolor para tragar -cansancio o debilidad inusual Efectos secundarios que, por lo general, no requieren atencin mdica (debe informarlos a su mdico o a su profesional de la salud si persisten o si son molestos): -diarrea -nuseas -gas de estmago -vmito Puede ser que esta lista no menciona todos los posibles efectos secundarios. Comunquese a su mdico por asesoramiento mdico [employer] Usted puede informar los efectos secundarios a la FDA por telfono al 1-800-FDA-1088. Dnde debo guardar mi medicina? Mantngala fuera del alcance de los nios. Gurdela a temperatura ambiente de entre 15 y 17 grados C (17 y 48 grados F). Mantenga el envase bien cerrado para proteger el medicamento de la luz y de la humedad. Deseche todo el medicamento que no haya utilizado, despus de la fecha de vencimiento. ATENCIN: Este folleto es un resumen. Puede ser que no cubra toda la posible informacin. Si usted tiene preguntas acerca de esta medicina, consulte con su mdico, su farmacutico o su profesional de Technical sales engineer.  2015, [employer]. (2008-04-28 12:54:36)

## 2014-11-15 NOTE — Progress Notes (Signed)
Pt instructed per Dr. Julien Nordmann  To increse  K-Dur tabs to  One tablet, twice a day for 7 days then continue on one tab by mouth for  After 7 days.

## 2014-11-15 NOTE — Progress Notes (Signed)
Quick Note:  Call patient with the result and order K dur 20 meq po qd X 7 days ______

## 2014-11-15 NOTE — Telephone Encounter (Signed)
-----   Message from Curt Bears, MD sent at 11/15/2014 11:36 AM EST ----- Call patient with the result and order K dur 20 meq po qd X 7 days

## 2014-11-21 ENCOUNTER — Telehealth: Payer: Self-pay | Admitting: Physician Assistant

## 2014-11-21 ENCOUNTER — Other Ambulatory Visit (HOSPITAL_BASED_OUTPATIENT_CLINIC_OR_DEPARTMENT_OTHER): Payer: Medicare Other

## 2014-11-21 ENCOUNTER — Ambulatory Visit (HOSPITAL_BASED_OUTPATIENT_CLINIC_OR_DEPARTMENT_OTHER): Payer: BC Managed Care – PPO | Admitting: Physician Assistant

## 2014-11-21 ENCOUNTER — Encounter: Payer: Self-pay | Admitting: Physician Assistant

## 2014-11-21 VITALS — BP 118/69 | HR 118 | Temp 97.5°F | Resp 18 | Ht 68.0 in | Wt 154.1 lb

## 2014-11-21 DIAGNOSIS — C3491 Malignant neoplasm of unspecified part of right bronchus or lung: Secondary | ICD-10-CM

## 2014-11-21 DIAGNOSIS — C3431 Malignant neoplasm of lower lobe, right bronchus or lung: Secondary | ICD-10-CM

## 2014-11-21 DIAGNOSIS — R630 Anorexia: Secondary | ICD-10-CM

## 2014-11-21 LAB — COMPREHENSIVE METABOLIC PANEL (CC13)
ALK PHOS: 95 U/L (ref 40–150)
ALT: 18 U/L (ref 0–55)
AST: 15 U/L (ref 5–34)
Albumin: 3.6 g/dL (ref 3.5–5.0)
Anion Gap: 13 mEq/L — ABNORMAL HIGH (ref 3–11)
BILIRUBIN TOTAL: 0.83 mg/dL (ref 0.20–1.20)
BUN: 14.5 mg/dL (ref 7.0–26.0)
CO2: 22 mEq/L (ref 22–29)
CREATININE: 0.9 mg/dL (ref 0.7–1.3)
Calcium: 9.8 mg/dL (ref 8.4–10.4)
Chloride: 104 mEq/L (ref 98–109)
EGFR: 86 mL/min/{1.73_m2} — AB (ref >90)
Glucose: 150 mg/dl — ABNORMAL HIGH (ref 70–140)
Potassium: 3.8 mEq/L (ref 3.5–5.1)
Sodium: 139 mEq/L (ref 136–145)
TOTAL PROTEIN: 6.8 g/dL (ref 6.4–8.3)

## 2014-11-21 LAB — CBC WITH DIFFERENTIAL/PLATELET
BASO%: 2.1 % — ABNORMAL HIGH (ref 0.0–2.0)
Basophils Absolute: 0.1 10*3/uL (ref 0.0–0.1)
EOS%: 0.3 % (ref 0.0–7.0)
Eosinophils Absolute: 0 10*3/uL (ref 0.0–0.5)
HCT: 29.7 % — ABNORMAL LOW (ref 38.4–49.9)
HGB: 10 g/dL — ABNORMAL LOW (ref 13.0–17.1)
LYMPH%: 25.3 % (ref 14.0–49.0)
MCH: 31 pg (ref 27.2–33.4)
MCHC: 33.7 g/dL (ref 32.0–36.0)
MCV: 92 fL (ref 79.3–98.0)
MONO#: 0.4 10*3/uL (ref 0.1–0.9)
MONO%: 10.4 % (ref 0.0–14.0)
NEUT%: 61.9 % (ref 39.0–75.0)
NEUTROS ABS: 2.1 10*3/uL (ref 1.5–6.5)
NRBC: 0 % (ref 0–0)
Platelets: 214 10*3/uL (ref 140–400)
RBC: 3.23 10*6/uL — ABNORMAL LOW (ref 4.20–5.82)
RDW: 20.1 % — AB (ref 11.0–14.6)
WBC: 3.4 10*3/uL — ABNORMAL LOW (ref 4.0–10.3)
lymph#: 0.9 10*3/uL (ref 0.9–3.3)

## 2014-11-21 MED ORDER — HYDROCODONE-ACETAMINOPHEN 5-325 MG PO TABS
1.0000 | ORAL_TABLET | Freq: Four times a day (QID) | ORAL | Status: DC | PRN
Start: 2014-11-21 — End: 2014-12-21

## 2014-11-21 MED ORDER — METHYLPREDNISOLONE (PAK) 4 MG PO TABS
ORAL_TABLET | ORAL | Status: DC
Start: 2014-11-21 — End: 2014-12-21

## 2014-11-21 NOTE — Telephone Encounter (Signed)
Gave avs & cal forDec. Sent mess to sch tx. °

## 2014-11-21 NOTE — Progress Notes (Addendum)
[facility] [phone]   [phone]  SHARED VISIT PROGRESS NOTE  Daryl Ou, MD [name] [address]  DIAGNOSIS: Stage IIIB (T3, N3, M0) non-small cell lung cancer, squamous cell carcinoma presenting with right upper lobe obstructing mass as well as mediastinal and right supraclavicular lymphadenopathy diagnosed in August of 2015.  PRIOR THERAPY: status post palliative radiotherapy to the obstructing lung mass in the right upper lobe under the care of Dr. [name]  completed on 08/23/2014.  CURRENT THERAPY: 1)  systemic chemotherapy with carboplatin for AUC of 5 on day 1 and Abraxane 100 mg/M2 on days 1, 8 and 15 every 3 weeks. First dose 08/30/2014. Status post 4 cycles.   INTERVAL HISTORY: Daryl Vega 68 y.o. male returns to the clinic today for followup visit accompanied by his wife. He reports that they got married 2 weeks ago.  He is status post 4 cycles of his systemic chemotherapy with carboplatin and Abraxane.He complains of right mid back and bilateral wrist pain for the past few weeks. 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 His ain medication does help and he requests a refill for the Norco.  He presents prior to proceeding  with day 1 cycle #5. He also reports some fatigue and generalized malaise. His appetite has decreased slightly. He continues to have shortness of breath at baseline and increased with exertion. He denied having any significant chest pain. He has no fever or chills, no nausea or vomiting.   MEDICAL HISTORY: Past Medical History  Diagnosis Date  . DM (diabetes mellitus)   . Pneumonia   . Hypercholesteremia   . Melanoma   . Radiation 08/03/14-08/23/14    36 gray to right chest  . Lung cancer     ALLERGIES:  has No Known Allergies.  MEDICATIONS:  Current Outpatient Prescriptions  Medication Sig Dispense Refill  . atorvastatin (LIPITOR) 80 MG  tablet Take 80 mg by mouth daily.    . Cholecalciferol (VITAMIN D3 PO) Take 10,000 Units by mouth daily.    . Cinnamon 500 MG capsule Take 500 mg by mouth daily.    . Dapagliflozin Propanediol (FARXIGA) 5 MG TABS Take 5 mg by mouth 2 (two) times daily.    [name] glimepiride (AMARYL) 4 MG tablet Take 4 mg by mouth 2 (two) times daily. Take before meals    . hyaluronate sodium (RADIAPLEXRX) GEL Apply 1 application topically once.    [name] HYDROcodone-acetaminophen (NORCO) 5-325 MG per tablet Take 1 tablet by mouth every 6 (six) hours as needed for moderate pain. 30 tablet 0  . LORazepam (ATIVAN) 0.5 MG tablet Take 1 table by mouth every 8 to 12 hours as needed for anxiety 40 tablet 0  . omeprazole (PRILOSEC) 20 MG capsule Take 20 mg by mouth daily.    . potassium chloride SA (K-DUR,KLOR-CON) 20 MEQ tablet Take 1 tablet (20 mEq total) by mouth daily. 7 tablet 0  . budesonide-formoterol (SYMBICORT) 160-4.5 MCG/ACT inhaler Take 2 puffs first thing in am and then another 2 puffs about 12 hours later. (Patient not taking: Reported on 11/21/2014) 1 Inhaler 3  . metFORMIN (GLUCOPHAGE) 500 MG tablet Take 500 mg by mouth 2 (two) times daily with a meal.    . methylPREDNIsolone (MEDROL DOSPACK) 4 MG tablet follow package directions 21 tablet 0  . potassium chloride (K-DUR) 10 MEQ tablet Take 2 tablets (20 mEq total) by mouth  2 (two) times daily. 12 tablet 0  . potassium chloride SA (K-DUR,KLOR-CON) 20 MEQ tablet Take 1 tablet (20 mEq total) by mouth daily. (Patient not taking: Reported on 11/21/2014) 20 tablet 0  . prochlorperazine (COMPAZINE) 10 MG tablet Take 1 tablet (10 mg total) by mouth every 6 (six) hours as needed for nausea or vomiting. (Patient not taking: Reported on 11/21/2014) 30 tablet 1  . temazepam (RESTORIL) 15 MG capsule Take 1 capsule (15 mg total) by mouth at bedtime as needed for sleep. (Patient not taking: Reported on 11/21/2014) 20 capsule 0   No current facility-administered medications for this  visit.    SURGICAL HISTORY:  Past Surgical History  Procedure Laterality Date  . Video bronchoscopy Bilateral 07/20/2014    Procedure: VIDEO BRONCHOSCOPY WITHOUT FLUORO;  Surgeon: Tanda Rockers, MD;  Location: [facility];  Service: Cardiopulmonary;  Laterality: Bilateral;    REVIEW OF SYSTEMS:  Constitutional: positive for anorexia, fatigue, malaise and weight loss Eyes: negative Ears, nose, mouth, throat, and face: negative Respiratory: positive for dyspnea on exertion Cardiovascular: negative Gastrointestinal: negative Genitourinary:negative Integument/breast: negative Hematologic/lymphatic: negative Musculoskeletal:positive for arthralgias and back pain Neurological: negative Behavioral/Psych: positive for anxiety Endocrine: negative Allergic/Immunologic: negative   PHYSICAL EXAMINATION: General appearance: alert, cooperative, fatigued and no distress Head: Normocephalic, without obvious abnormality, atraumatic Neck: no adenopathy, no JVD, supple, symmetrical, trachea midline and thyroid not enlarged, symmetric, no tenderness/mass/nodules Lymph nodes: Cervical, supraclavicular, and axillary nodes normal. Resp: clear to auscultation bilaterally Back: symmetric, no curvature. ROM normal. No CVA tenderness., point tenderness right posterior rib cage Cardio: regular rate and rhythm, S1, S2 normal, no murmur, click, rub or gallop GI: soft, non-tender; bowel sounds normal; no masses,  no organomegaly Extremities: extremities normal, atraumatic, no cyanosis or edema Neurologic: Alert and oriented X 3, normal strength and tone. Normal symmetric reflexes. Normal coordination and gait Skin: dry and cracked in general with a raw area in the web space between the second and third fingers, no evidence of infection  ECOG PERFORMANCE STATUS: 1 - Symptomatic but completely ambulatory  Blood pressure 118/69, pulse 118, temperature 97.5 F (36.4 C), temperature source Oral, resp. rate 18,  height 5\' 8"  (1.727 m), weight 154 lb 1.6 oz (69.899 kg).  LABORATORY DATA: Lab Results  Component Value Date   WBC 3.4* 11/21/2014   HGB 10.0* 11/21/2014   HCT 29.7* 11/21/2014   MCV 92.0 11/21/2014   PLT 214 11/21/2014      Chemistry      Component Value Date/Time   NA 139 11/21/2014 1440   NA 140 10/28/2014 0202   K 3.8 11/21/2014 1440   K 3.2* 10/28/2014 0202   CL 102 10/28/2014 0202   CO2 22 11/21/2014 1440   CO2 22 10/23/2014 0415   BUN 14.5 11/21/2014 1440   BUN 10 10/28/2014 0202   CREATININE 0.9 11/21/2014 1440   CREATININE 0.80 10/28/2014 0202      Component Value Date/Time   CALCIUM 9.8 11/21/2014 1440   CALCIUM 8.5 10/23/2014 0415   ALKPHOS 95 11/21/2014 1440   ALKPHOS 77 10/04/2014 0904   AST 15 11/21/2014 1440   AST 13 10/04/2014 0904   ALT 18 11/21/2014 1440   ALT 17 10/04/2014 0904   BILITOT 0.83 11/21/2014 1440   BILITOT 0.4 10/04/2014 0904       RADIOGRAPHIC STUDIES: Dg Eye Foreign Body  08/01/2014   CLINICAL DATA:  Implanted hearing aids.  Pre MRI evaluation.  EXAM: ORBITS FOR FOREIGN BODY - 2 VIEW  COMPARISON:  Sinus CT 07/17/2014  FINDINGS: Evidence for bilateral electronic hearing aid devices. No radiopaque foreign bodies or metallic structures in the region of the orbits. Small metallic structures associated with the maxilla.  IMPRESSION: No evidence of metallic foreign body within the orbits.  Bilateral hearing aid devices.   Electronically Signed   By: Markus Daft M.D.   On: 08/01/2014 11:59   Dg Chest 2 View  07/31/2014   CLINICAL DATA:  Cough.  Short of breath.  Fever.  EXAM: CHEST  2 VIEW  COMPARISON:  PET-CT, 07/26/2014.  Chest radiograph, 07/11/2014.  FINDINGS: Dense consolidation in the right upper lobe reflects postobstructive pneumonitis. Lobe could be secondarily infected. Mild reticular lung base opacity, mostly on the left, is likely subsegmental atelectasis. No other areas of lung consolidation. No pleural effusion.  Known right hilar  mass is obscured by the contiguous right upper lobe consolidation. No left hilar mass. Mild thickening along the right peritracheal stripe. Cardiac silhouette is normal in size.  Bony thorax is intact.  IMPRESSION: 1. Dense consolidation in the right upper lobe reflects post obstruction change, likely postobstructive pneumonitis. Secondary pneumonia is possible. The extent of the consolidation is similar to the recent PET-CT. 2. No other areas of lung consolidation. No pulmonary edema. Mild basilar subsegmental atelectasis. No pleural effusion.   Electronically Signed   By: Lajean Manes M.D.   On: 07/31/2014 13:09   Mr Jeri Cos HE Contrast  08/01/2014   CLINICAL DATA:  68 year old male with confusion and recent diagnosis of lung cancer. Staging. Subsequent encounter.  EXAM: MRI HEAD WITHOUT AND WITH CONTRAST  TECHNIQUE: Multiplanar, multiecho pulse sequences of the brain and surrounding structures were obtained without and with intravenous contrast.  CONTRAST:  29mL MULTIHANCE GADOBENATE DIMEGLUMINE 529 MG/ML IV SOLN  COMPARISON:  Paranasal sinus CT 07/17/2014.  FINDINGS: No abnormal enhancement identified. No midline shift, mass effect, or evidence of intracranial mass lesion.  Susceptibility artifact related to dental hardware, mildly degrades some portions of this exam. Cerebral volume is within normal limits for age. No restricted diffusion to suggest acute infarction. No ventriculomegaly, extra-axial collection or acute intracranial hemorrhage. Cervicomedullary junction and pituitary are within normal limits. Major intracranial vascular flow voids are preserved, dominant distal left vertebral artery. Pearline Cables and white matter signal is within normal limits for age throughout the brain.  Degenerative disc and endplate degeneration at C3-C4 resulting in cervical spinal stenosis, appears to be mild.  Visualized bone marrow signal is within normal limits. Visible internal auditory structures appear normal. Trace  inferior right mastoid effusion. Negative visible paranasal sinuses. Postoperative changes to the globes. Visualized scalp soft tissues are within normal limits.  IMPRESSION: No acute or metastatic intracranial abnormality.   Electronically Signed   By: Lars Pinks M.D.   On: 08/01/2014 20:29   Nm Pet Image Initial (pi) Skull Base To Thigh  07/26/2014   CLINICAL DATA:  Initial treatment strategy for lung nodule.  EXAM: NUCLEAR MEDICINE PET SKULL BASE TO THIGH  TECHNIQUE: 8.5 mCi F-18 FDG was injected intravenously. Full-ring PET imaging was performed from the skull base to thigh after the radiotracer. CT data was obtained and used for attenuation correction and anatomic localization.  FASTING BLOOD GLUCOSE:  Value: 151 mg/dl  COMPARISON:  CT chest 07/17/2014.  FINDINGS: NECK  No hypermetabolic lymph nodes in the neck. CT images show no acute findings.  CHEST  A right supraclavicular lymph node measures 9 mm (CT image 42) with an SUV max of 2.8.  Mediastinal lymph nodes measure up to 12 mm in the low right paratracheal station with an SUV max of 3.8. Right hilar mass was better measured on 07/17/2014 and has an SUV max of 7.2.  When coronal images from diagnostic examination performed 07/17/2014 are reviewed, the right hilar mass appears to come within 1.7 cm of the carina. Right upper lobe bronchus is completely obstructed with marked progression of postobstructive consolidation and pneumonitis in the right upper lobe, which has an SUV max of 9.2 anteriorly. There is narrowing of the bronchus intermedius and right lower lobe bronchus as well, with new postobstructive pneumonitis in the right lower lobe.  CT images show atherosclerotic calcification of the arterial vasculature, including three-vessel involvement of the coronary arteries. Heart size normal. Tiny amount of pericardial fluid may be physiologic. No pleural fluid. Respiratory motion degrades image quality upon review of the lung windows. There is  persistent ground-glass airspace disease in the left lower lobe. Nodular consolidation in the left lower lobe (series 8, image 57) is seen as well.  ABDOMEN/PELVIS  No abnormal hypermetabolism within the liver, adrenal glands, spleen or pancreas. No hypermetabolic lymph nodes.  CT images show the liver to be grossly unremarkable. A large stone fills the fundus of the gallbladder, measuring approximately 3.6 cm. A 1.6 cm low density nodule in the right adrenal gland is stable. Left adrenal gland is unremarkable. Low-attenuation lesions in the kidneys measure up to 1.5 cm on the right and are difficult to definitively characterize without post-contrast imaging. Tiny stones in the right kidney. Spleen, pancreas, stomach and bowel are grossly unremarkable.  Atherosclerotic calcification of the arterial vasculature without abdominal aortic aneurysm. Small bilateral inguinal hernias contain fat. No free fluid.  SKELETON  No abnormal osseous hypermetabolism.  IMPRESSION: 1. Hypermetabolic right hilar mass with hypermetabolic ipsilateral mediastinal and right supraclavicular lymph nodes, most consistent with primary bronchogenic carcinoma. Given apparent distance of less than 2 cm from the carina, findings are worrisome for T3 N3 M0 or stage IIIB disease. 2. Complete obstruction of the right upper lobe bronchus with marked narrowing of the bronchus intermedius and right lower lobe bronchus. Progressive postobstructive pneumonitis in the right upper lobe with new postobstructive changes in the right lower lobe. 3. Persistent ground-glass and nodular airspace disease in the left lower lobe, better imaged on 07/17/2014. 4. Tiny amount of pericardial fluid may be physiologic. 5. Three-vessel coronary artery calcification. 6. Right adrenal adenoma. 7. Tiny right renal stones. 8. Small bilateral inguinal hernias contain fat.   Electronically Signed   By: Lorin Picket M.D.   On: 07/26/2014 09:16    ASSESSMENT AND PLAN: This  is a very pleasant 68 years old white male recently diagnosed with a stage IIIB non-small cell lung cancer status post a short course of palliative radiotherapy to the obstructing mass in the right upper lobe. He is currently being treated with systemic chemotherapy with carboplatin for AUC of 5 on day 1 and Abraxane 100 mg/M2 on days 1, 8 and 15 every 3 weeks. He is status post 4 cycles.The patient was discussed with and also seen by Dr. Julien Nordmann. His recent CT scan showed interval improvement in his disease. He will proceed with cycle  #5 as scheduled. He will return in 3 weeks for another symptom management visit at the start of cycle #6. He was given refill prescriptions for his hydrocodone tablets ( 5/325 mg #30 x 0 refills). A prescription for Medrol Dosepak was sent to his pharmacy of record via  E scribed for  appetite stimulation and this may also calm his joints down and offer some relief from his wrist pain and posterior rib pain. He was advised to call immediately if he has any concerning symptoms in the interval. The patient voices understanding of current disease status and treatment options and is in agreement with the current care plan.  All questions were answered. The patient knows to call the clinic with any problems, questions or concerns. We can certainly see the patient much sooner if necessary.  Disclaimer: This note was dictated with voice recognition software. Similar sounding words can inadvertently be transcribed and may not be corrected upon review.  Carlton Adam, PA-C 11/21/2014  ADDENDUM: Hematology/Oncology Attending: I had a face to face encounter with the patient today. I recommended his care plan. This is a very pleasant 68 years old white male with history of stage IIIB non-small cell lung cancer, squamous cell carcinoma. He is currently undergoing systemic chemotherapy was carboplatin and Abraxane status post 4 cycles. He is tolerating his treatment fairly well  with no significant adverse effect septa for mild fatigue. He recently got married to his girlfriend 2 weeks ago. He complains of lack of appetite and lost few pounds recently. I recommended for the patient to proceed with his systemic chemotherapy as scheduled. He will start cycle #5 tomorrow and the patient would come back for follow-up visit in 3 weeks with the next cycle of his treatment. For the lack of appetite, we will start the patient on Medrol Dosepak.[name] He was advised to call immediately if he has any concerning symptoms in the interval.  Disclaimer: This note was dictated with voice recognition software. Similar sounding words can inadvertently be transcribed and may be missed upon review. Eilleen Kempf., MD 11/21/2014

## 2014-11-21 NOTE — Patient Instructions (Signed)
Take the medrol dosepak as prescribed for appetite stimulation. This may also offer some relief from your joint pains Continue with labs and chemotherapy as scheduled Follow up in 3 weeks, prior to the start of your next scheduled cycle of chemotherapy

## 2014-11-22 ENCOUNTER — Other Ambulatory Visit: Payer: BC Managed Care – PPO

## 2014-11-22 ENCOUNTER — Telehealth: Payer: Self-pay | Admitting: *Deleted

## 2014-11-22 ENCOUNTER — Ambulatory Visit (HOSPITAL_BASED_OUTPATIENT_CLINIC_OR_DEPARTMENT_OTHER): Payer: Medicare Other

## 2014-11-22 DIAGNOSIS — Z5111 Encounter for antineoplastic chemotherapy: Secondary | ICD-10-CM

## 2014-11-22 DIAGNOSIS — C3411 Malignant neoplasm of upper lobe, right bronchus or lung: Secondary | ICD-10-CM

## 2014-11-22 MED ORDER — ONDANSETRON 16 MG/50ML IVPB (CHCC)
16.0000 mg | Freq: Once | INTRAVENOUS | Status: AC
  Administered 2014-11-22: 16 mg via INTRAVENOUS

## 2014-11-22 MED ORDER — ONDANSETRON 16 MG/50ML IVPB (CHCC)
INTRAVENOUS | Status: AC
  Filled 2014-11-22: qty 16

## 2014-11-22 MED ORDER — SODIUM CHLORIDE 0.9 % IV SOLN
Freq: Once | INTRAVENOUS | Status: AC
  Administered 2014-11-22: 10:00:00 via INTRAVENOUS

## 2014-11-22 MED ORDER — OXYCODONE-ACETAMINOPHEN 5-325 MG PO TABS
1.0000 | ORAL_TABLET | Freq: Once | ORAL | Status: AC
  Administered 2014-11-22: 1 via ORAL

## 2014-11-22 MED ORDER — DEXAMETHASONE SODIUM PHOSPHATE 20 MG/5ML IJ SOLN
INTRAMUSCULAR | Status: AC
  Filled 2014-11-22: qty 5

## 2014-11-22 MED ORDER — OXYCODONE-ACETAMINOPHEN 5-325 MG PO TABS
ORAL_TABLET | ORAL | Status: AC
  Filled 2014-11-22: qty 1

## 2014-11-22 MED ORDER — PACLITAXEL PROTEIN-BOUND CHEMO INJECTION 100 MG
100.0000 mg/m2 | Freq: Once | INTRAVENOUS | Status: AC
  Administered 2014-11-22: 200 mg via INTRAVENOUS
  Filled 2014-11-22: qty 40

## 2014-11-22 MED ORDER — DEXAMETHASONE SODIUM PHOSPHATE 20 MG/5ML IJ SOLN
20.0000 mg | Freq: Once | INTRAMUSCULAR | Status: AC
  Administered 2014-11-22: 20 mg via INTRAVENOUS

## 2014-11-22 MED ORDER — SODIUM CHLORIDE 0.9 % IV SOLN
500.0000 mg | Freq: Once | INTRAVENOUS | Status: AC
  Administered 2014-11-22: 500 mg via INTRAVENOUS
  Filled 2014-11-22: qty 50

## 2014-11-22 NOTE — Progress Notes (Signed)
@   1000 am pt. C/o right upper quadrant pain. He takes hydrocodone/apap at home but did not take this morning. Notified Dr. Julien Nordmann and received order for oxycodone/apap 5/325. This was given to pt.  Pt. Voiced understanding.  @ 1130 pt. Sleeping. Appears comfortable.

## 2014-11-22 NOTE — Telephone Encounter (Signed)
Per staff message and POF I have scheduled appts. Advised scheduler of appts. JMW  

## 2014-11-22 NOTE — Patient Instructions (Signed)
[facility] Discharge Instructions for Patients Receiving Chemotherapy  Today you received the following chemotherapy agents: Abraxane and Carboplatin.  To help prevent nausea and vomiting after your treatment, we encourage you to take your nausea medication: Compazine 10 mg every 6 hours.   If you develop nausea and vomiting that is not controlled by your nausea medication, call the clinic.   BELOW ARE SYMPTOMS THAT SHOULD BE REPORTED IMMEDIATELY:  *FEVER GREATER THAN 100.5 F  *CHILLS WITH OR WITHOUT FEVER  NAUSEA AND VOMITING THAT IS NOT CONTROLLED WITH YOUR NAUSEA MEDICATION  *UNUSUAL SHORTNESS OF BREATH  *UNUSUAL BRUISING OR BLEEDING  TENDERNESS IN MOUTH AND THROAT WITH OR WITHOUT PRESENCE OF ULCERS  *URINARY PROBLEMS  *BOWEL PROBLEMS  UNUSUAL RASH Items with * indicate a potential emergency and should be followed up as soon as possible.  Feel free to call the clinic you have any questions or concerns. The clinic phone number is [phone].

## 2014-11-29 ENCOUNTER — Telehealth: Payer: Self-pay | Admitting: *Deleted

## 2014-11-29 ENCOUNTER — Ambulatory Visit: Payer: BC Managed Care – PPO

## 2014-11-29 ENCOUNTER — Other Ambulatory Visit: Payer: BC Managed Care – PPO

## 2014-11-29 ENCOUNTER — Encounter (HOSPITAL_COMMUNITY): Payer: Self-pay | Admitting: Family Medicine

## 2014-11-29 ENCOUNTER — Inpatient Hospital Stay (HOSPITAL_COMMUNITY)
Admission: EM | Admit: 2014-11-29 | Discharge: 2014-12-21 | DRG: 326 | Disposition: A | Discharge status: Home Health | Payer: Medicare Other | Attending: Internal Medicine | Admitting: Internal Medicine

## 2014-11-29 DIAGNOSIS — K921 Melena: Secondary | ICD-10-CM

## 2014-11-29 DIAGNOSIS — E876 Hypokalemia: Secondary | ICD-10-CM | POA: Diagnosis not present

## 2014-11-29 DIAGNOSIS — J811 Chronic pulmonary edema: Secondary | ICD-10-CM

## 2014-11-29 DIAGNOSIS — C3490 Malignant neoplasm of unspecified part of unspecified bronchus or lung: Secondary | ICD-10-CM

## 2014-11-29 DIAGNOSIS — Z79899 Other long term (current) drug therapy: Secondary | ICD-10-CM

## 2014-11-29 DIAGNOSIS — Z9221 Personal history of antineoplastic chemotherapy: Secondary | ICD-10-CM

## 2014-11-29 DIAGNOSIS — D696 Thrombocytopenia, unspecified: Secondary | ICD-10-CM | POA: Diagnosis present

## 2014-11-29 DIAGNOSIS — N390 Urinary tract infection, site not specified: Secondary | ICD-10-CM | POA: Diagnosis not present

## 2014-11-29 DIAGNOSIS — K823 Fistula of gallbladder: Secondary | ICD-10-CM | POA: Diagnosis present

## 2014-11-29 DIAGNOSIS — K264 Chronic or unspecified duodenal ulcer with hemorrhage: Secondary | ICD-10-CM | POA: Diagnosis not present

## 2014-11-29 DIAGNOSIS — C3411 Malignant neoplasm of upper lobe, right bronchus or lung: Secondary | ICD-10-CM | POA: Diagnosis present

## 2014-11-29 DIAGNOSIS — R571 Hypovolemic shock: Secondary | ICD-10-CM | POA: Diagnosis present

## 2014-11-29 DIAGNOSIS — J9601 Acute respiratory failure with hypoxia: Secondary | ICD-10-CM | POA: Diagnosis not present

## 2014-11-29 DIAGNOSIS — A0472 Enterocolitis due to Clostridium difficile, not specified as recurrent: Secondary | ICD-10-CM | POA: Diagnosis not present

## 2014-11-29 DIAGNOSIS — J449 Chronic obstructive pulmonary disease, unspecified: Secondary | ICD-10-CM | POA: Diagnosis not present

## 2014-11-29 DIAGNOSIS — K260 Acute duodenal ulcer with hemorrhage: Secondary | ICD-10-CM | POA: Diagnosis present

## 2014-11-29 DIAGNOSIS — A047 Enterocolitis due to Clostridium difficile: Secondary | ICD-10-CM | POA: Diagnosis not present

## 2014-11-29 DIAGNOSIS — C3400 Malignant neoplasm of unspecified main bronchus: Secondary | ICD-10-CM | POA: Diagnosis not present

## 2014-11-29 DIAGNOSIS — K922 Gastrointestinal hemorrhage, unspecified: Secondary | ICD-10-CM | POA: Diagnosis present

## 2014-11-29 DIAGNOSIS — E785 Hyperlipidemia, unspecified: Secondary | ICD-10-CM | POA: Diagnosis present

## 2014-11-29 DIAGNOSIS — E43 Unspecified severe protein-calorie malnutrition: Secondary | ICD-10-CM | POA: Diagnosis present

## 2014-11-29 DIAGNOSIS — Z9889 Other specified postprocedural states: Secondary | ICD-10-CM

## 2014-11-29 DIAGNOSIS — Z923 Personal history of irradiation: Secondary | ICD-10-CM

## 2014-11-29 DIAGNOSIS — B961 Klebsiella pneumoniae [K. pneumoniae] as the cause of diseases classified elsewhere: Secondary | ICD-10-CM | POA: Diagnosis not present

## 2014-11-29 DIAGNOSIS — C3401 Malignant neoplasm of right main bronchus: Secondary | ICD-10-CM | POA: Diagnosis not present

## 2014-11-29 DIAGNOSIS — I10 Essential (primary) hypertension: Secondary | ICD-10-CM | POA: Diagnosis present

## 2014-11-29 DIAGNOSIS — E872 Acidosis: Secondary | ICD-10-CM | POA: Diagnosis present

## 2014-11-29 DIAGNOSIS — K920 Hematemesis: Secondary | ICD-10-CM | POA: Diagnosis not present

## 2014-11-29 DIAGNOSIS — R000 Tachycardia, unspecified: Secondary | ICD-10-CM | POA: Diagnosis present

## 2014-11-29 DIAGNOSIS — R739 Hyperglycemia, unspecified: Secondary | ICD-10-CM | POA: Diagnosis present

## 2014-11-29 DIAGNOSIS — E1165 Type 2 diabetes mellitus with hyperglycemia: Secondary | ICD-10-CM | POA: Diagnosis present

## 2014-11-29 DIAGNOSIS — Z87891 Personal history of nicotine dependence: Secondary | ICD-10-CM | POA: Diagnosis not present

## 2014-11-29 DIAGNOSIS — D62 Acute posthemorrhagic anemia: Secondary | ICD-10-CM | POA: Diagnosis not present

## 2014-11-29 DIAGNOSIS — D649 Anemia, unspecified: Secondary | ICD-10-CM | POA: Diagnosis present

## 2014-11-29 DIAGNOSIS — E119 Type 2 diabetes mellitus without complications: Secondary | ICD-10-CM

## 2014-11-29 DIAGNOSIS — Z6823 Body mass index (BMI) 23.0-23.9, adult: Secondary | ICD-10-CM

## 2014-11-29 DIAGNOSIS — J9811 Atelectasis: Secondary | ICD-10-CM

## 2014-11-29 DIAGNOSIS — R0603 Acute respiratory distress: Secondary | ICD-10-CM

## 2014-11-29 DIAGNOSIS — D500 Iron deficiency anemia secondary to blood loss (chronic): Secondary | ICD-10-CM | POA: Diagnosis present

## 2014-11-29 DIAGNOSIS — R918 Other nonspecific abnormal finding of lung field: Secondary | ICD-10-CM | POA: Diagnosis present

## 2014-11-29 DIAGNOSIS — N3000 Acute cystitis without hematuria: Secondary | ICD-10-CM | POA: Diagnosis not present

## 2014-11-29 HISTORY — DX: Family history of other disabilities and chronic diseases leading to disablement, not elsewhere classified: Z82.8

## 2014-11-29 HISTORY — DX: Family history of other specified conditions: Z84.89

## 2014-11-29 LAB — HEMOGLOBIN A1C
HEMOGLOBIN A1C: 6.1 % — AB (ref <5.7)
Mean Plasma Glucose: 128 mg/dL — ABNORMAL HIGH (ref <117)

## 2014-11-29 LAB — I-STAT CHEM 8, ED
BUN: 26 mg/dL — ABNORMAL HIGH (ref 6–23)
CALCIUM ION: 1.05 mmol/L — AB (ref 1.13–1.30)
Chloride: 102 mEq/L (ref 96–112)
Creatinine, Ser: 0.8 mg/dL (ref 0.50–1.35)
Glucose, Bld: 232 mg/dL — ABNORMAL HIGH (ref 70–99)
HCT: 18 % — ABNORMAL LOW (ref 39.0–52.0)
HEMOGLOBIN: 6.1 g/dL — AB (ref 13.0–17.0)
Potassium: 3.1 mEq/L — ABNORMAL LOW (ref 3.7–5.3)
SODIUM: 136 meq/L — AB (ref 137–147)
TCO2: 18 mmol/L (ref 0–100)

## 2014-11-29 LAB — GLUCOSE, CAPILLARY
GLUCOSE-CAPILLARY: 159 mg/dL — AB (ref 70–99)
GLUCOSE-CAPILLARY: 134 mg/dL — AB (ref 70–99)

## 2014-11-29 LAB — COMPREHENSIVE METABOLIC PANEL
ALK PHOS: 58 U/L (ref 39–117)
ALT: 11 U/L (ref 0–53)
ANION GAP: 13 (ref 5–15)
AST: 10 U/L (ref 0–37)
Albumin: 2.9 g/dL — ABNORMAL LOW (ref 3.5–5.2)
BUN: 30 mg/dL — AB (ref 6–23)
CHLORIDE: 97 meq/L (ref 96–112)
CO2: 24 meq/L (ref 19–32)
Calcium: 8.7 mg/dL (ref 8.4–10.5)
Creatinine, Ser: 0.9 mg/dL (ref 0.50–1.35)
GFR calc non Af Amer: 86 mL/min — ABNORMAL LOW (ref >90)
GLUCOSE: 250 mg/dL — AB (ref 70–99)
POTASSIUM: 3.3 meq/L — AB (ref 3.7–5.3)
Sodium: 134 mEq/L — ABNORMAL LOW (ref 137–147)
Total Bilirubin: 0.6 mg/dL (ref 0.3–1.2)
Total Protein: 5.6 g/dL — ABNORMAL LOW (ref 6.0–8.3)

## 2014-11-29 LAB — HEPATIC FUNCTION PANEL
ALT: 11 U/L (ref 0–53)
AST: 10 U/L (ref 0–37)
Albumin: 2.6 g/dL — ABNORMAL LOW (ref 3.5–5.2)
Alkaline Phosphatase: 48 U/L (ref 39–117)
BILIRUBIN DIRECT: 0.3 mg/dL (ref 0.0–0.3)
BILIRUBIN TOTAL: 1.6 mg/dL — AB (ref 0.3–1.2)
Indirect Bilirubin: 1.3 mg/dL — ABNORMAL HIGH (ref 0.3–0.9)
Total Protein: 4.9 g/dL — ABNORMAL LOW (ref 6.0–8.3)

## 2014-11-29 LAB — CBC WITH DIFFERENTIAL/PLATELET
Basophils Absolute: 0 10*3/uL (ref 0.0–0.1)
Basophils Relative: 0 % (ref 0–1)
Eosinophils Absolute: 0 10*3/uL (ref 0.0–0.7)
Eosinophils Relative: 0 % (ref 0–5)
HCT: 17.4 % — ABNORMAL LOW (ref 39.0–52.0)
HEMOGLOBIN: 5.9 g/dL — AB (ref 13.0–17.0)
LYMPHS ABS: 0.4 10*3/uL — AB (ref 0.7–4.0)
LYMPHS PCT: 9 % — AB (ref 12–46)
MCH: 31.4 pg (ref 26.0–34.0)
MCHC: 33.9 g/dL (ref 30.0–36.0)
MCV: 92.6 fL (ref 78.0–100.0)
Monocytes Absolute: 0.4 10*3/uL (ref 0.1–1.0)
Monocytes Relative: 8 % (ref 3–12)
NEUTROS ABS: 3.8 10*3/uL (ref 1.7–7.7)
NEUTROS PCT: 82 % — AB (ref 43–77)
Platelets: 200 10*3/uL (ref 150–400)
RBC: 1.88 MIL/uL — AB (ref 4.22–5.81)
RDW: 18.8 % — ABNORMAL HIGH (ref 11.5–15.5)
WBC: 4.7 10*3/uL (ref 4.0–10.5)

## 2014-11-29 LAB — TSH: TSH: 0.951 u[IU]/mL (ref 0.350–4.500)

## 2014-11-29 LAB — ABO/RH: ABO/RH(D): B POS

## 2014-11-29 LAB — TROPONIN I

## 2014-11-29 LAB — CBC
HCT: 22.5 % — ABNORMAL LOW (ref 39.0–52.0)
HEMOGLOBIN: 7.7 g/dL — AB (ref 13.0–17.0)
MCH: 30.6 pg (ref 26.0–34.0)
MCHC: 34.2 g/dL (ref 30.0–36.0)
MCV: 89.3 fL (ref 78.0–100.0)
PLATELETS: 143 10*3/uL — AB (ref 150–400)
RBC: 2.52 MIL/uL — ABNORMAL LOW (ref 4.22–5.81)
RDW: 18.2 % — ABNORMAL HIGH (ref 11.5–15.5)
WBC: 3.6 10*3/uL — ABNORMAL LOW (ref 4.0–10.5)

## 2014-11-29 LAB — MRSA PCR SCREENING: MRSA by PCR: NEGATIVE

## 2014-11-29 LAB — LIPASE, BLOOD: Lipase: 41 U/L (ref 11–59)

## 2014-11-29 LAB — APTT: aPTT: 24 seconds (ref 24–37)

## 2014-11-29 LAB — PREPARE RBC (CROSSMATCH)

## 2014-11-29 LAB — MAGNESIUM: Magnesium: 1.8 mg/dL (ref 1.5–2.5)

## 2014-11-29 LAB — PROTIME-INR
INR: 1.21 (ref 0.00–1.49)
Prothrombin Time: 15.5 seconds — ABNORMAL HIGH (ref 11.6–15.2)

## 2014-11-29 LAB — POC OCCULT BLOOD, ED: Fecal Occult Bld: POSITIVE — AB

## 2014-11-29 MED ORDER — HYDROCODONE-ACETAMINOPHEN 5-325 MG PO TABS
1.0000 | ORAL_TABLET | Freq: Four times a day (QID) | ORAL | Status: DC | PRN
  Administered 2014-11-29: 1 via ORAL
  Filled 2014-11-29: qty 1

## 2014-11-29 MED ORDER — MAGNESIUM SULFATE 2 GM/50ML IV SOLN
2.0000 g | Freq: Once | INTRAVENOUS | Status: AC
  Administered 2014-11-29: 2 g via INTRAVENOUS
  Filled 2014-11-29: qty 50

## 2014-11-29 MED ORDER — INSULIN ASPART 100 UNIT/ML ~~LOC~~ SOLN: 0.0000 [IU] | Freq: Three times a day (TID) | SUBCUTANEOUS | Status: DC

## 2014-11-29 MED ORDER — SODIUM CHLORIDE 0.9 % IV SOLN
80.0000 mg | Freq: Once | INTRAVENOUS | Status: AC
  Administered 2014-11-29: 80 mg via INTRAVENOUS
  Filled 2014-11-29: qty 80

## 2014-11-29 MED ORDER — SODIUM CHLORIDE 0.9 % IV SOLN
Freq: Once | INTRAVENOUS | Status: AC
  Administered 2014-11-29: 10 mL/h via INTRAVENOUS

## 2014-11-29 MED ORDER — PANTOPRAZOLE SODIUM 40 MG IV SOLR: 40.0000 mg | Freq: Two times a day (BID) | INTRAVENOUS | Status: DC

## 2014-11-29 MED ORDER — SODIUM CHLORIDE 0.9 % IV SOLN
INTRAVENOUS | Status: DC
  Administered 2014-11-29 – 2014-11-30 (×3): via INTRAVENOUS
  Filled 2014-11-29 (×8): qty 1000

## 2014-11-29 MED ORDER — SODIUM CHLORIDE 0.9 % IJ SOLN
3.0000 mL | Freq: Two times a day (BID) | INTRAMUSCULAR | Status: DC
  Administered 2014-11-29 – 2014-12-06 (×12): 3 mL via INTRAVENOUS

## 2014-11-29 MED ORDER — OXYCODONE HCL 5 MG PO TABS: 5.0000 mg | ORAL_TABLET | ORAL | Status: DC | PRN

## 2014-11-29 MED ORDER — TEMAZEPAM 15 MG PO CAPS: 15.0000 mg | ORAL_CAPSULE | Freq: Every evening | ORAL | Status: DC | PRN

## 2014-11-29 MED ORDER — INSULIN ASPART 100 UNIT/ML ~~LOC~~ SOLN
0.0000 [IU] | Freq: Four times a day (QID) | SUBCUTANEOUS | Status: DC
  Administered 2014-11-29: 2 [IU] via SUBCUTANEOUS
  Administered 2014-11-29 – 2014-11-30 (×2): 1 [IU] via SUBCUTANEOUS
  Administered 2014-11-30: 2 [IU] via SUBCUTANEOUS

## 2014-11-29 MED ORDER — BUDESONIDE-FORMOTEROL FUMARATE 160-4.5 MCG/ACT IN AERO
2.0000 | INHALATION_SPRAY | Freq: Two times a day (BID) | RESPIRATORY_TRACT | Status: AC
  Administered 2014-11-29 – 2014-12-08 (×12): 2 via RESPIRATORY_TRACT
  Filled 2014-11-29 (×2): qty 6

## 2014-11-29 MED ORDER — LEVALBUTEROL HCL 0.63 MG/3ML IN NEBU: 0.6300 mg | INHALATION_SOLUTION | Freq: Four times a day (QID) | RESPIRATORY_TRACT | Status: DC | PRN

## 2014-11-29 MED ORDER — SODIUM CHLORIDE 0.9 % IV BOLUS (SEPSIS)
1000.0000 mL | Freq: Once | INTRAVENOUS | Status: AC
  Administered 2014-11-29: 1000 mL via INTRAVENOUS

## 2014-11-29 MED ORDER — ONDANSETRON HCL 4 MG/2ML IJ SOLN: 4.0000 mg | Freq: Four times a day (QID) | INTRAMUSCULAR | Status: DC | PRN

## 2014-11-29 MED ORDER — ONDANSETRON HCL 4 MG/2ML IJ SOLN
4.0000 mg | Freq: Once | INTRAMUSCULAR | Status: AC
  Administered 2014-11-29: 4 mg via INTRAVENOUS
  Filled 2014-11-29: qty 2

## 2014-11-29 MED ORDER — CHLORHEXIDINE GLUCONATE 0.12 % MT SOLN
15.0000 mL | Freq: Two times a day (BID) | OROMUCOSAL | Status: DC
  Administered 2014-11-29 – 2014-11-30 (×4): 15 mL via OROMUCOSAL
  Filled 2014-11-29 (×3): qty 15

## 2014-11-29 MED ORDER — PANTOPRAZOLE SODIUM 40 MG IV SOLR
40.0000 mg | Freq: Once | INTRAVENOUS | Status: AC
  Administered 2014-11-29: 40 mg via INTRAVENOUS
  Filled 2014-11-29: qty 40

## 2014-11-29 MED ORDER — PROCHLORPERAZINE MALEATE 10 MG PO TABS
10.0000 mg | ORAL_TABLET | Freq: Four times a day (QID) | ORAL | Status: DC | PRN
  Filled 2014-11-29: qty 1

## 2014-11-29 MED ORDER — CETYLPYRIDINIUM CHLORIDE 0.05 % MT LIQD: 7.0000 mL | Freq: Two times a day (BID) | OROMUCOSAL | Status: DC

## 2014-11-29 MED ORDER — PANTOPRAZOLE SODIUM 40 MG IV SOLR
8.0000 mg/h | INTRAVENOUS | Status: DC
  Administered 2014-11-29 – 2014-11-30 (×3): 8 mg/h via INTRAVENOUS
  Filled 2014-11-29 (×6): qty 80

## 2014-11-29 MED ORDER — SODIUM CHLORIDE 0.9 % IV SOLN: INTRAVENOUS | Status: DC

## 2014-11-29 MED ORDER — LORAZEPAM 0.5 MG PO TABS: 0.5000 mg | ORAL_TABLET | Freq: Four times a day (QID) | ORAL | Status: DC | PRN

## 2014-11-29 MED ORDER — SODIUM CHLORIDE 0.9 % IV SOLN
1000.0000 mL | INTRAVENOUS | Status: DC
  Administered 2014-11-29: 1000 mL via INTRAVENOUS

## 2014-11-29 MED ORDER — ONDANSETRON HCL 4 MG PO TABS: 4.0000 mg | ORAL_TABLET | Freq: Four times a day (QID) | ORAL | Status: DC | PRN

## 2014-11-29 NOTE — ED Notes (Signed)
Per EMS, patient vomited and had blood in emesis. Unknown if it is dark or bright red. EMS reports when moving patient from his bed to stretcher, he was weak.

## 2014-11-29 NOTE — Consult Note (Signed)
Consultation  Referring Provider:  [facility]    Primary Care Physician:  [name], MD Primary Gastroenterologist: none        Reason for Consultation:  GI bleed             HPI:   [name] is a 68 y.o. male with a history of Stage IIIB (T3, N3, M0) non-small cell lung cancer diagnosed August 2015. He is s/p radiation, currently undergoing chemotherapy. He presented to ED this last night with weakness, hematemesis and falls at home (? Syncopal episode).  Hgb around baseline (10) on 11/21/14, it was 5.9 in ED last night. BUN 30.   Patient fell in bathroom last night. After returning to bed he vomited a large amount of dark red blood. He admits to black stools over last few days. No NSAID use. No history of PUD. No abdominal pain. His appetite has been poor resulting in weight loss of 60 pounds since September. No chronic GI complaints other than recent development of constipation after starting pain medication.  No [place] of GI diseases.  Past Medical History  Diagnosis Date  . DM (diabetes mellitus)   . Pneumonia   . Hypercholesteremia   . Melanoma   . Radiation 08/03/14-08/23/14    35 gray to right chest  . Lung cancer   . FH: chemotherapy     Past Surgical History  Procedure Laterality Date  . Video bronchoscopy Bilateral 07/20/2014    Procedure: VIDEO BRONCHOSCOPY WITHOUT FLUORO;  Surgeon: Tanda Rockers, MD;  Location: [facility];  Service: Cardiopulmonary;  Laterality: Bilateral;    Family History  Problem Relation Age of Onset  . Heart disease Mother   . Cancer Mother     ? type     History  Substance Use Topics  . Smoking status: Former Smoker -- 2.00 packs/day for 50 years    Types: Cigarettes, Cigars    Quit date: 06/12/2014  . Smokeless tobacco: Current User    Types: Chew  . Alcohol Use: No    Prior to Admission medications   Medication Sig Start Date End Date Taking? Authorizing Provider  atorvastatin (LIPITOR) 80 MG tablet Take 80 mg  by mouth daily.   Yes Historical Provider, MD  budesonide-formoterol (SYMBICORT) 160-4.5 MCG/ACT inhaler Take 2 puffs first thing in am and then another 2 puffs about 12 hours later. Patient taking differently: Inhale 2 puffs into the lungs 2 (two) times daily as needed.  10/17/14  Yes Tanda Rockers, MD  Cholecalciferol (VITAMIN D3 PO) Take 10,000 Units by mouth daily.   Yes Historical Provider, MD  Cinnamon 500 MG capsule Take 500 mg by mouth daily.   Yes Historical Provider, MD  Dapagliflozin Propanediol (FARXIGA) 5 MG TABS Take 5 mg by mouth 2 (two) times daily.   Yes Historical Provider, MD  glimepiride (AMARYL) 4 MG tablet Take 4 mg by mouth 2 (two) times daily. Take before meals   Yes Historical Provider, MD  HYDROcodone-acetaminophen (NORCO) 5-325 MG per tablet Take 1 tablet by mouth every 6 (six) hours as needed for moderate pain. 11/21/14  Yes Carlton Adam, PA-C  LORazepam (ATIVAN) 0.5 MG tablet Take 1 table by mouth every 8 to 12 hours as needed for anxiety 11/08/14  Yes Adrena E Johnson, PA-C  metFORMIN (GLUCOPHAGE) 500 MG tablet Take 500 mg by mouth 2 (two) times daily with a meal.   Yes Historical Provider, MD  methylPREDNIsolone (MEDROL DOSPACK) 4 MG tablet  follow package directions 11/21/14  Yes Adrena E Johnson, PA-C  omeprazole (PRILOSEC) 20 MG capsule Take 20 mg by mouth daily.   Yes Historical Provider, MD  potassium chloride SA (K-DUR,KLOR-CON) 20 MEQ tablet Take 1 tablet (20 mEq total) by mouth daily. 10/28/14  Yes Garald Balding, NP  PRESCRIPTION MEDICATION once a week.   Yes Historical Provider, MD  prochlorperazine (COMPAZINE) 10 MG tablet Take 1 tablet (10 mg total) by mouth every 6 (six) hours as needed for nausea or vomiting. 10/25/14  Yes Drue Second, NP  temazepam (RESTORIL) 15 MG capsule Take 1 capsule (15 mg total) by mouth at bedtime as needed for sleep. 11/01/14  Yes Curt Bears, MD  potassium chloride (K-DUR) 10 MEQ tablet Take 2 tablets (20 mEq total) by  mouth 2 (two) times daily. 10/23/14 10/26/14  Barton Dubois, MD  potassium chloride SA (K-DUR,KLOR-CON) 20 MEQ tablet Take 1 tablet (20 mEq total) by mouth daily. Patient not taking: Reported on 11/21/2014 10/28/14   Garald Balding, NP    Current Facility-Administered Medications  Medication Dose Route Frequency Provider Last Rate Last Dose  . [START ON 11/30/2014] antiseptic oral rinse (CPC / CETYLPYRIDINIUM CHLORIDE 0.05%) solution 7 mL  7 mL Mouth Rinse q12n4p [name], MD      . budesonide-formoterol (SYMBICORT) 160-4.5 MCG/ACT inhaler 2 puff  2 puff Inhalation BID [name], MD      . chlorhexidine (PERIDEX) 0.12 % solution 15 mL  15 mL Mouth Rinse BID [name], MD      . HYDROcodone-acetaminophen (NORCO/VICODIN) 5-325 MG per tablet 1 tablet  1 tablet Oral Q6H PRN [name], MD      . insulin aspart (novoLOG) injection 0-9 Units  0-9 Units Subcutaneous QID [name], MD      . levalbuterol (XOPENEX) nebulizer solution 0.63 mg  0.63 mg Nebulization Q6H PRN [name], MD      . LORazepam (ATIVAN) tablet 0.5 mg  0.5 mg Oral Q6H PRN [name], MD      . ondansetron (ZOFRAN) tablet 4 mg  4 mg Oral Q6H PRN [name], MD       Or  . ondansetron (ZOFRAN) injection 4 mg  4 mg Intravenous Q6H PRN [name], MD      . oxyCODONE (Oxy IR/ROXICODONE) immediate release tablet 5 mg  5 mg Oral Q4H PRN [name], MD      . pantoprazole (PROTONIX) 80 mg in sodium chloride 0.9 % 250 mL (0.32 mg/mL) infusion  8 mg/hr Intravenous Continuous [name], MD 25 mL/hr at 11/29/14 1000 8 mg/hr at 11/29/14 1000  . [START ON 12/02/2014] pantoprazole (PROTONIX) injection 40 mg  40 mg Intravenous Q12H [name], MD      . prochlorperazine (COMPAZINE) tablet 10 mg  10 mg Oral Q6H PRN [name], MD      . sodium chloride 0.9 % 1,000 mL with potassium chloride 60 mEq infusion   Intravenous Continuous [name], MD 100 mL/hr at 11/29/14 0930    . sodium chloride 0.9 % injection 3 mL   3 mL Intravenous Q12H [name], MD   3 mL at 11/29/14 0953  . temazepam (RESTORIL) capsule 15 mg  15 mg Oral QHS PRN [name], MD        Allergies as of 11/29/2014  . (No Known Allergies)    Review of Systems:    All systems reviewed and negative except where noted in HPI.    Physical Exam:  Vital signs in last 24 hours: Temp:  [98 F (36.7 C)-98.6 F (37 C)] 98.3 F (36.8 C) (12/16 1135) Pulse Rate:  [106-120] 106 (12/16 1135) Resp:  [13-24] 16 (12/16 1135) BP: (78-95)/(56-69) 91/61 mmHg (12/16 1135) SpO2:  [92 %-100 %] 97 % (12/16 1135) Weight:  [155 lb (70.308 kg)] 155 lb (70.308 kg) (12/16 7619) Last BM Date: 11/29/14 General:   Pleasant white male in NAD Head:  Normocephalic and atraumatic. Eyes:   No icterus.   Conjunctiva pale. Ears:  Normal auditory acuity. Neck:  Supple; no masses felt Lungs:  Respirations even and unlabored. RUL rhonchi.  Heart:  Regular rate and rhythm Abdomen:  Soft, nondistended, nontender. Normal bowel sounds. No appreciable masses or hepatomegaly.  Msk:  Symmetrical without gross deformities.  Extremities:  Without edema. Neurologic:  Alert and  oriented x4;  grossly normal neurologically. Skin:  Intact without significant lesions or rashes. Cervical Nodes:  No significant cervical adenopathy. Psych:  Alert and cooperative. Normal affect.  LAB RESULTS:  Recent Labs  11/29/14 0644 11/29/14 0713  WBC 4.7  --   HGB 5.9* 6.1*  HCT 17.4* 18.0*  PLT 200  --    BMET  Recent Labs  11/29/14 0644 11/29/14 0713  NA 134* 136*  K 3.3* 3.1*  CL 97 102  CO2 24  --   GLUCOSE 250* 232*  BUN 30* 26*  CREATININE 0.90 0.80  CALCIUM 8.7  --    LFT  Recent Labs  11/29/14 0644  PROT 5.6*  ALBUMIN 2.9*  AST 10  ALT 11  ALKPHOS 58  BILITOT 0.6   PT/INR  Recent Labs  11/29/14 0644  LABPROT 15.5*  INR 1.21    PREVIOUS ENDOSCOPIES:            none   Impression / Plan:   54. 68 year old male with NSCLC, s/p radiation  and undergoing chemo since September. Patient presents with episode of hematemesis, black stools over last few days (heme +) and profound anemia. Rule out PUD, AVMs, or neoplasm. Radiation injury of esophagus seem unlikely based on location of treatment and absence of esophageal symptoms.  Mallory Weiss tear possible given preceding melena this seems unlikely. Patient will need EGD, probably in am. Continue PPI drip.  2. Anemia of acute blood loss (hgb 5.9).  He is getting second unit of blood now.    Thanks   LOS: 0 days   Tye Savoy  11/29/2014, 11:53 AM    ________________________________________________________________________  [facility] MD note:  I personally examined the patient, reviewed the data and agree with the assessment and plan described above.  He takes a fair amount of NSAID per his report.  Planning on EGD tomorrow morning.  IV PPI for now.   Owens Loffler, MD [facility] Pager [phone]

## 2014-11-29 NOTE — H&P (Addendum)
[facility] History and Physical  Daryl Vega HYQ:[id number] DOB: [birth year] DOA: 11/29/2014  Referring physician: * PCP: [name], MD   Chief Complaint: Hematemesis HPI:  68 y.o. male with a PMHx of DM2, HLD, melanoma, NSCLC currently undergoing radiation/chemotherapy, who presents to the ED with complaints of lightheadedness , hematemesis, melena, syncopal episode with standing which has been ongoing for ~1week and caused him to fall back into the bathroom wall this morning around 3:30 am. Pt's wife reports that when she came to assist him, he was on the floor of the bathroom. The wife and son had left him on the floor.  Reports that he's been feeling weak for some time, since starting chemo. He had 2 episodes of melena over the course of the last 2 days. One episode of bloody hematemesis this morning. They called the oncology office 2 days ago but never heard back.  Last chemotherapy was on 11/22/14, and is scheduled for an infusion today. Oncologist is Dr. Aundra Dubin.    Pt denies currently feeling nauseated or any ongoing emesis. Patient does not recall when was her last colonoscopy or endoscopy. He denies taking any aspirin or NSAIDs at home. Fecal occult blood positive. Denies fever, chills, HA, vision changes, tinnitus, syncope, CP, SOB, abd pain, constipation, obstipation, hematochezia, hematuria, dysuria, paresthesias, numbness, or focal weakness.    Wife reports that since starting chemo he hasn't had an appetite, uses compazine for nausea. Wife also reports that pt looks more pale than normal.  Hemoglobin has been trending down, 10.0 on 12/8, 5.9 today. Platelet count is 200. Blood pressure was soft with systolic in the 95M upon arrival. Patient is emaciated and a proton extra in the ER. Currently receiving 2 units of blood    Review of Systems  Constitutional: Positive for appetite change (decreased). Negative for fever and chills.  HENT: Negative for hearing loss,  nosebleeds and tinnitus.  Eyes: Negative for visual disturbance.  Respiratory: Negative for shortness of breath.  Cardiovascular: Positive for syncope. Negative for chest pain and palpitations.  Gastrointestinal: Positive for nausea, vomiting, diarrhea and blood in stool (melena). Negative for abdominal pain.  Genitourinary: Negative for dysuria, urgency, hematuria and flank pain.  Musculoskeletal: Negative for myalgias, back pain and arthralgias.  Skin: Positive for pallor. Negative for color change.  Neurological: Positive for dizziness, weakness and light-headedness. Negative for syncope, numbness and headaches.  Psychiatric/Behavioral: Negative for confusion.   10 Systems reviewed and are negative for acute change except as noted in the HPI.       Past Medical History  Diagnosis Date  . DM (diabetes mellitus)   . Pneumonia   . Hypercholesteremia   . Melanoma   . Radiation 08/03/14-08/23/14    35 gray to right chest  . Lung cancer   . FH: chemotherapy      Past Surgical History  Procedure Laterality Date  . Video bronchoscopy Bilateral 07/20/2014    Procedure: VIDEO BRONCHOSCOPY WITHOUT FLUORO;  Surgeon: Tanda Rockers, MD;  Location: [facility];  Service: Cardiopulmonary;  Laterality: Bilateral;      Social History:  reports that he quit smoking about 5 months ago. His smoking use included Cigarettes and Cigars. He has a 100 pack-year smoking history. His smokeless tobacco use includes Chew. He reports that he does not drink alcohol or use illicit drugs.    No Known Allergies  Family History  Problem Relation Age of Onset  . Heart disease Mother   . Cancer Mother     ?  type     Prior to Admission medications   Medication Sig Start Date End Date Taking? Authorizing Provider  atorvastatin (LIPITOR) 80 MG tablet Take 80 mg by mouth daily.   Yes Historical Provider, MD  budesonide-formoterol (SYMBICORT) 160-4.5 MCG/ACT inhaler Take 2 puffs first thing in am and  then another 2 puffs about 12 hours later. Patient taking differently: Inhale 2 puffs into the lungs 2 (two) times daily as needed.  10/17/14  Yes Tanda Rockers, MD  Cholecalciferol (VITAMIN D3 PO) Take 10,000 Units by mouth daily.   Yes Historical Provider, MD  Cinnamon 500 MG capsule Take 500 mg by mouth daily.   Yes Historical Provider, MD  Dapagliflozin Propanediol (FARXIGA) 5 MG TABS Take 5 mg by mouth 2 (two) times daily.   Yes Historical Provider, MD  glimepiride (AMARYL) 4 MG tablet Take 4 mg by mouth 2 (two) times daily. Take before meals   Yes Historical Provider, MD  HYDROcodone-acetaminophen (NORCO) 5-325 MG per tablet Take 1 tablet by mouth every 6 (six) hours as needed for moderate pain. 11/21/14  Yes Carlton Adam, PA-C  LORazepam (ATIVAN) 0.5 MG tablet Take 1 table by mouth every 8 to 12 hours as needed for anxiety 11/08/14  Yes Adrena E Johnson, PA-C  metFORMIN (GLUCOPHAGE) 500 MG tablet Take 500 mg by mouth 2 (two) times daily with a meal.   Yes Historical Provider, MD  methylPREDNIsolone (MEDROL DOSPACK) 4 MG tablet follow package directions 11/21/14  Yes Adrena E Johnson, PA-C  omeprazole (PRILOSEC) 20 MG capsule Take 20 mg by mouth daily.   Yes Historical Provider, MD  potassium chloride SA (K-DUR,KLOR-CON) 20 MEQ tablet Take 1 tablet (20 mEq total) by mouth daily. 10/28/14  Yes Garald Balding, NP  PRESCRIPTION MEDICATION once a week.   Yes Historical Provider, MD  prochlorperazine (COMPAZINE) 10 MG tablet Take 1 tablet (10 mg total) by mouth every 6 (six) hours as needed for nausea or vomiting. 10/25/14  Yes Drue Second, NP  temazepam (RESTORIL) 15 MG capsule Take 1 capsule (15 mg total) by mouth at bedtime as needed for sleep. 11/01/14  Yes Curt Bears, MD  potassium chloride (K-DUR) 10 MEQ tablet Take 2 tablets (20 mEq total) by mouth 2 (two) times daily. 10/23/14 10/26/14  Barton Dubois, MD  potassium chloride SA (K-DUR,KLOR-CON) 20 MEQ tablet Take 1 tablet (20 mEq  total) by mouth daily. Patient not taking: Reported on 11/21/2014 10/28/14   Garald Balding, NP     Physical Exam: Filed Vitals:   11/29/14 0900 11/29/14 1000 11/29/14 1040 11/29/14 1045  BP: 94/69 94/63 93/59  93/59  Pulse: 113 110 107 110  Temp:   98.4 F (36.9 C) 98.4 F (36.9 C)  TempSrc:   Oral Oral  Resp: 16 21 16 19   Height:      Weight:      SpO2: 92% 99% 97% 98%   Constitutional: He is oriented to person, place, and time. He appears well-developed. Non-toxic appearance. No distress.  Thin frail appearing male with pallor, tachycardic in the 110s and hypotensive BP 89/63  HENT:  Head: Normocephalic and atraumatic. Head is without raccoon's eyes, without Battle's sign and without contusion.  Mouth/Throat: Oropharynx is clear and moist and mucous membranes are normal.  [place]/AT, no scalp TTP or crepitus Dried blood at nares, no ongoing epistaxis  Eyes: Conjunctivae and EOM are normal. Pupils are equal, round, and reactive to light. Right eye exhibits no discharge. Left eye exhibits no discharge.  PERRL, EOMI  Neck: Normal range of motion. Neck supple.  Cardiovascular: Regular rhythm, normal heart sounds and intact distal pulses. Tachycardia present. Exam reveals no gallop and no friction rub.  No murmur heard. Tachycardic, regular rhythm, no m/r/g  Pulmonary/Chest: Effort normal. No respiratory distress. He has no decreased breath sounds. He has no wheezes. He has rhonchi in the right upper field and the right middle field. He has no rales.  RUF/RMF rhonchi with all other fields clear to auscultation No increased WOB, SpO2 100% on RA  Abdominal: Soft. Normal appearance and bowel sounds are normal. He exhibits no distension. There is no tenderness. There is no rigidity, no rebound, no guarding, no tenderness at McBurney's point and negative Murphy's sign.  Soft, NTND, +BS throughout, no r/g/r, neg murphy's, neg mcburney's  Genitourinary: Rectal exam shows no external  hemorrhoid, no internal hemorrhoid, no fissure, no mass, no tenderness and anal tone normal. Guaiac positive stool.  Darker greenish brown/black stool visualized on digital exam, FOBT+, normal tone, no tenderness, no masses, no hemorrhoids or fissure.  Musculoskeletal: Normal range of motion.  Strength 5/5 in all extremities Sensation grossly intact in all extremities Distal pulses intact  Neurological: He is alert and oriented to person, place, and time. He has normal strength. No sensory deficit.  A&O x4 CN 2-12 grossly intact Sensation and strength intact  Skin: Skin is warm, dry and intact. No rash noted. There is pallor.  No abrasions or bruising +Pallor  Psychiatric: He has a normal mood and affect.  Nursing note and vitals reviewed.     Labs on Admission:    Basic Metabolic Panel:  Recent Labs Lab 11/29/14 0644 11/29/14 0713  NA 134* 136*  K 3.3* 3.1*  CL 97 102  CO2 24  --   GLUCOSE 250* 232*  BUN 30* 26*  CREATININE 0.90 0.80  CALCIUM 8.7  --    Liver Function Tests:  Recent Labs Lab 11/29/14 0644  AST 10  ALT 11  ALKPHOS 58  BILITOT 0.6  PROT 5.6*  ALBUMIN 2.9*    Recent Labs Lab 11/29/14 0644  LIPASE 41   No results for input(s): AMMONIA in the last 168 hours. CBC:  Recent Labs Lab 11/29/14 0644 11/29/14 0713  WBC 4.7  --   NEUTROABS 3.8  --   HGB 5.9* 6.1*  HCT 17.4* 18.0*  MCV 92.6  --   PLT 200  --    Cardiac Enzymes: No results for input(s): CKTOTAL, CKMB, CKMBINDEX, TROPONINI in the last 168 hours.  BNP (last 3 results) No results for input(s): PROBNP in the last 8760 hours.    CBG: No results for input(s): GLUCAP in the last 168 hours.  Radiological Exams on Admission: No results found.  EKG: Independently reviewed. Assessment/Plan Active Problems:   Anemia   GI bleed  Hematemesis/melena Hemoglobin has dropped significantly, but patient also receiving chemotherapy Start the patient on Protonix drip Follow  CBC closely Transfusion of 2 units of packed red blood cells [facility] contacted Keep the patient nothing by mouth for now, until seen by gastroenterology  Diabetes Hold farxiga, metformin Start the patient on sliding scale insulin  ,Hypovolemic shock Hopefully will improve with blood transfusions  Dyslipidemia Hold Lipitor  COPD continue Symbicort    Code Status:   full Family Communication: bedside Disposition Plan: admit   Time spent: 70 mins   [facility] Pager [phone]  If 7PM-7AM, please contact night-coverage [email or web address] Password [email or web address] 11/29/2014, 10:56 AM

## 2014-11-29 NOTE — Progress Notes (Signed)
MD [name] at bedside and advised patient plan is to do EGD tomorrow at 10:15.

## 2014-11-29 NOTE — ED Notes (Signed)
Bed: JK82 Expected date: 11/29/14 Expected time: 5:41 AM Means of arrival: Ambulance Comments: Vomiting blood, weak

## 2014-11-29 NOTE — ED Provider Notes (Signed)
The patient has a history of lung cancer undergoing chemotherapy. He noticed some dark stools the last couple of days. This morning he woke up feeling very weak and had a near syncopal event. He did vomit blood as well. Denies any abdominal pain he has not noticed any bright red blood in his stool. He denies any history of prior GI bleeding. Physical Exam  BP 117/61 mmHg  Pulse 87  Temp(Src) 98.2 F (36.8 C) (Oral)  Resp 18  Ht 5\' 7"  (1.702 m)  Wt 173 lb (78.472 kg)  BMI 27.09 kg/m2  SpO2 100%  Physical Exam  Constitutional: No distress.  HENT:  Head: Normocephalic and atraumatic.  Right Ear: External ear normal.  Left Ear: External ear normal.  Some dried blood in the oropharynx and left nares  Eyes: Conjunctivae are normal. Right eye exhibits no discharge. Left eye exhibits no discharge. No scleral icterus.  Neck: Neck supple. No tracheal deviation present.  Cardiovascular: Normal rate.  Pulmonary/Chest: Effort normal. No stridor. No respiratory distress.  Abdominal: Soft. Bowel sounds are normal. He exhibits no distension. There is no tenderness. There is no rebound.  Musculoskeletal: He exhibits no edema.  Neurological: He is alert. Cranial nerve deficit: no gross deficits.  Skin: Skin is warm and dry. No rash noted. He is not diaphoretic. There is pallor.  Psychiatric: He has a normal mood and affect.  Nursing note and vitals reviewed.   ED Course  Procedures CRITICAL CARE Performed by: GUYQI,HKV Total critical care time: 30 Critical care time was exclusive of separately billable procedures and treating other patients. Critical care was necessary to treat or prevent imminent or life-threatening deterioration. Critical care was time spent personally by me on the following activities: development of treatment plan with patient and/or surrogate as well as nursing, discussions with consultants, evaluation of patient's response to treatment, examination of patient,  obtaining history from patient or surrogate, ordering and performing treatments and interventions, ordering and review of laboratory studies, ordering and review of radiographic studies, pulse oximetry and re-evaluation of patient's condition.  MDM Patient's hemoglobin is significantly decreased. Patient appears to be having an upper GI bleed. I have contacted the blood bank. Plan to transfuse 2 units initially. Patient will be admitted to the stepdown unit. Consult with gastroenterology     Dorie Rank, MD 11/29/14 [phone]

## 2014-11-29 NOTE — ED Provider Notes (Signed)
CSN: [id number]     Arrival date & time 11/29/14  0559 History   First MD Initiated Contact with Patient 11/29/14 [phone]     Chief Complaint  Patient presents with  . Hematemesis  . Weakness    Generalized     (Consider location/radiation/quality/duration/timing/severity/associated sxs/prior Treatment) HPI Comments: Daryl Vega is a 68 y.o. male with a PMHx of DM2, HLD, melanoma, NSCLC currently undergoing radiation/chemotherapy, who presents to the ED with complaints of lightheadedness with standing which has been ongoing for ~1week and caused him to fall back into the bathroom wall this morning around 3:30 am. Pt's wife reports that when she came to assist him, he was on the floor of the bathroom. Pt states he hit his back and nothing else, and denies pain. Reports that he's been feeling weak for some time, since starting chemo. Last infusion 11/22/14, and is scheduled for an infusion today. Oncologist is Dr. Aundra Dubin. Pt reports that when he returned to bed, he felt nauseated and vomited a large amount of bloody emesis. Wife states it wasn't bright red but it wasn't dark either. Somewhat maroon. This occurred once and has since subsided. Pt denies currently feeling nauseated or any ongoing emesis. Wife reports some darker stools recently, cannot describe any further characteristics, and pt states he didn't look at his stool. Denies fever, chills, HA, vision changes, tinnitus, syncope, CP, SOB, abd pain, constipation, obstipation, hematochezia, hematuria, dysuria, paresthesias, numbness, or focal weakness. Has not tried anything for his symptoms today. Wife reports that since starting chemo he hasn't had an appetite, uses compazine for nausea. Wife also reports that pt looks more pale than normal  Patient is a 68 y.o. male presenting with dizziness. The history is provided by the patient and the spouse. No language interpreter was used.  Dizziness Quality:  Lightheadedness Severity:   Mild Onset quality:  Gradual Duration:  1 week Timing:  Intermittent Progression:  Unchanged Chronicity:  New Context: standing up   Relieved by:  Lying down Worsened by:  Standing up Ineffective treatments:  None tried Associated symptoms: blood in stool (melena), diarrhea, nausea, syncope, vomiting and weakness   Associated symptoms: no chest pain, no headaches, no hearing loss, no palpitations, no shortness of breath, no tinnitus and no vision changes     Past Medical History  Diagnosis Date  . DM (diabetes mellitus)   . Pneumonia   . Hypercholesteremia   . Melanoma   . Radiation 08/03/14-08/23/14    35 gray to right chest  . Lung cancer   . FH: chemotherapy    Past Surgical History  Procedure Laterality Date  . Video bronchoscopy Bilateral 07/20/2014    Procedure: VIDEO BRONCHOSCOPY WITHOUT FLUORO;  Surgeon: Tanda Rockers, MD;  Location: [facility];  Service: Cardiopulmonary;  Laterality: Bilateral;   Family History  Problem Relation Age of Onset  . Heart disease Mother   . Cancer Mother     ? type   History  Substance Use Topics  . Smoking status: Former Smoker -- 2.00 packs/day for 50 years    Types: Cigarettes, Cigars    Quit date: 06/12/2014  . Smokeless tobacco: Current User    Types: Chew  . Alcohol Use: No    Review of Systems  Constitutional: Positive for appetite change (decreased). Negative for fever and chills.  HENT: Negative for hearing loss, nosebleeds and tinnitus.   Eyes: Negative for visual disturbance.  Respiratory: Negative for shortness of breath.   Cardiovascular: Positive for syncope.  Negative for chest pain and palpitations.  Gastrointestinal: Positive for nausea, vomiting, diarrhea and blood in stool (melena). Negative for abdominal pain.  Genitourinary: Negative for dysuria, urgency, hematuria and flank pain.  Musculoskeletal: Negative for myalgias, back pain and arthralgias.  Skin: Positive for pallor. Negative for color change.   Neurological: Positive for dizziness, weakness and light-headedness. Negative for syncope, numbness and headaches.  Psychiatric/Behavioral: Negative for confusion.   10 Systems reviewed and are negative for acute change except as noted in the HPI.    Allergies  Review of patient's allergies indicates no known allergies.  Home Medications   Prior to Admission medications   Medication Sig Start Date End Date Taking? Authorizing Provider  atorvastatin (LIPITOR) 80 MG tablet Take 80 mg by mouth daily.    Historical Provider, MD  budesonide-formoterol (SYMBICORT) 160-4.5 MCG/ACT inhaler Take 2 puffs first thing in am and then another 2 puffs about 12 hours later. Patient not taking: Reported on 11/21/2014 10/17/14   Tanda Rockers, MD  Cholecalciferol (VITAMIN D3 PO) Take 10,000 Units by mouth daily.    Historical Provider, MD  Cinnamon 500 MG capsule Take 500 mg by mouth daily.    Historical Provider, MD  Dapagliflozin Propanediol (FARXIGA) 5 MG TABS Take 5 mg by mouth 2 (two) times daily.    Historical Provider, MD  glimepiride (AMARYL) 4 MG tablet Take 4 mg by mouth 2 (two) times daily. Take before meals    Historical Provider, MD  hyaluronate sodium (RADIAPLEXRX) GEL Apply 1 application topically once.    Historical Provider, MD  HYDROcodone-acetaminophen (NORCO) 5-325 MG per tablet Take 1 tablet by mouth every 6 (six) hours as needed for moderate pain. 11/21/14   Carlton Adam, PA-C  LORazepam (ATIVAN) 0.5 MG tablet Take 1 table by mouth every 8 to 12 hours as needed for anxiety 11/08/14   Adrena E Johnson, PA-C  metFORMIN (GLUCOPHAGE) 500 MG tablet Take 500 mg by mouth 2 (two) times daily with a meal.    Historical Provider, MD  methylPREDNIsolone (MEDROL DOSPACK) 4 MG tablet follow package directions 11/21/14   Carlton Adam, PA-C  omeprazole (PRILOSEC) 20 MG capsule Take 20 mg by mouth daily.    Historical Provider, MD  potassium chloride (K-DUR) 10 MEQ tablet Take 2 tablets (20 mEq  total) by mouth 2 (two) times daily. 10/23/14 10/26/14  Barton Dubois, MD  potassium chloride SA (K-DUR,KLOR-CON) 20 MEQ tablet Take 1 tablet (20 mEq total) by mouth daily. Patient not taking: Reported on 11/21/2014 10/28/14   Garald Balding, NP  potassium chloride SA (K-DUR,KLOR-CON) 20 MEQ tablet Take 1 tablet (20 mEq total) by mouth daily. 10/28/14   Garald Balding, NP  prochlorperazine (COMPAZINE) 10 MG tablet Take 1 tablet (10 mg total) by mouth every 6 (six) hours as needed for nausea or vomiting. Patient not taking: Reported on 11/21/2014 10/25/14   Drue Second, NP  temazepam (RESTORIL) 15 MG capsule Take 1 capsule (15 mg total) by mouth at bedtime as needed for sleep. Patient not taking: Reported on 11/21/2014 11/01/14   Curt Bears, MD   BP 89/63 mmHg  Pulse 120  Temp(Src) 98 F (36.7 C) (Oral)  Resp 18  Ht 5\' 8"  (1.727 m)  Wt 155 lb (70.308 kg)  BMI 23.57 kg/m2  SpO2 100% Physical Exam  Constitutional: He is oriented to person, place, and time. He appears well-developed.  Non-toxic appearance. No distress.  Thin frail appearing male with pallor, tachycardic in the 110s and  hypotensive BP 89/63  HENT:  Head: Normocephalic and atraumatic. Head is without raccoon's eyes, without Battle's sign and without contusion.  Mouth/Throat: Oropharynx is clear and moist and mucous membranes are normal.  [place]/AT, no scalp TTP or crepitus Dried blood at nares, no ongoing epistaxis  Eyes: Conjunctivae and EOM are normal. Pupils are equal, round, and reactive to light. Right eye exhibits no discharge. Left eye exhibits no discharge.  PERRL, EOMI  Neck: Normal range of motion. Neck supple.  Cardiovascular: Regular rhythm, normal heart sounds and intact distal pulses.  Tachycardia present.  Exam reveals no gallop and no friction rub.   No murmur heard. Tachycardic, regular rhythm, no m/Daryl/g  Pulmonary/Chest: Effort normal. No respiratory distress. He has no decreased breath sounds. He has no  wheezes. He has rhonchi in the right upper field and the right middle field. He has no rales.  RUF/RMF rhonchi with all other fields clear to auscultation No increased WOB, SpO2 100% on RA  Abdominal: Soft. Normal appearance and bowel sounds are normal. He exhibits no distension. There is no tenderness. There is no rigidity, no rebound, no guarding, no tenderness at McBurney's point and negative Murphy's sign.  Soft, NTND, +BS throughout, no Daryl/g/Daryl, neg murphy's, neg mcburney's  Genitourinary: Rectal exam shows no external hemorrhoid, no internal hemorrhoid, no fissure, no mass, no tenderness and anal tone normal. Guaiac positive stool.  Darker greenish brown/black stool visualized on digital exam, FOBT+, normal tone, no tenderness, no masses, no hemorrhoids or fissure.   Musculoskeletal: Normal range of motion.  Strength 5/5 in all extremities Sensation grossly intact in all extremities Distal pulses intact  Neurological: He is alert and oriented to person, place, and time. He has normal strength. No sensory deficit.  A&O x4 CN 2-12 grossly intact Sensation and strength intact  Skin: Skin is warm, dry and intact. No rash noted. There is pallor.  No abrasions or bruising +Pallor  Psychiatric: He has a normal mood and affect.  Nursing note and vitals reviewed.   ED Course  Procedures (including critical care time) Labs Review Labs Reviewed  CBC WITH DIFFERENTIAL - Abnormal; Notable for the following:    RBC 1.88 (*)    Hemoglobin 5.9 (*)    HCT 17.4 (*)    RDW 18.8 (*)    Neutrophils Relative % 82 (*)    Lymphocytes Relative 9 (*)    Lymphs Abs 0.4 (*)    All other components within normal limits  COMPREHENSIVE METABOLIC PANEL - Abnormal; Notable for the following:    Sodium 134 (*)    Potassium 3.3 (*)    Glucose, Bld 250 (*)    BUN 30 (*)    Total Protein 5.6 (*)    Albumin 2.9 (*)    GFR calc non Af Amer 86 (*)    All other components within normal limits  PROTIME-INR -  Abnormal; Notable for the following:    Prothrombin Time 15.5 (*)    All other components within normal limits  I-STAT CHEM 8, ED - Abnormal; Notable for the following:    Sodium 136 (*)    Potassium 3.1 (*)    BUN 26 (*)    Glucose, Bld 232 (*)    Calcium, Ion 1.05 (*)    Hemoglobin 6.1 (*)    HCT 18.0 (*)    All other components within normal limits  POC OCCULT BLOOD, ED - Abnormal; Notable for the following:    Fecal Occult Bld POSITIVE (*)  All other components within normal limits  LIPASE, BLOOD  APTT  OCCULT BLOOD X 1 CARD TO LAB, STOOL  TYPE AND SCREEN  PREPARE RBC (CROSSMATCH)  ABO/RH    Imaging Review No results found.   EKG Interpretation   Date/Time:  Wednesday November 29 2014 06:05:35 EST Ventricular Rate:  121 PR Interval:  108 QRS Duration: 99 QT Interval:  409 QTC Calculation: 580 Daryl Axis:   72 Text Interpretation:  Sinus tachycardia Borderline ST depression, diffuse  leads Abnormal T, consider ischemia, diffuse leads Prolonged QT interval  No significant change since last tracing Confirmed by KNAPP  MD-J, JON  ([id number]) on 11/29/2014 6:19:49 AM      MDM   Final diagnoses:  Anemia, unspecified anemia type  Hematemesis with nausea    68 y.o. male here with lightheadedness. Appears pale. Tachycardic and hypotensive. Currently no longer vomiting and stable at this time. Will start IVs and give fluid boluses. Will obtain labs and coags. Will monitor closely. Likely anemia, likely admission.   7:22 AM CBC w/diff showing Hgb 5.9, will transfuse. Chem 8 showing mildly low Na 136, mildly low K 3.1. CMP pending. Lipase pending. APTT and INR WNL. EKG with sinus tachy. FOBT+. BPs improved with 1L fluid up to 94/61, HR 116. Will consult hospitalist for admission. Likely will need GI support for endoscopy since likely upper GI bleed. Protonix and zofran given. Pt stable.   7:42 AM Dr. Allyson Sabal returning page. Will admit. Requested that GI be consulted now.    7:51 AM Dr. Germain Osgood of GI returning page. Will come see pt. Please see Dr. Allyson Sabal and Dr. Mignon Pine notes for ongoing documentation of care. Pt stable and family updated at this time.  BP 94/61 mmHg  Pulse 116  Temp(Src) 98 F (36.7 C) (Oral)  Resp 24  Ht 5\' 8"  (1.727 m)  Wt 155 lb (70.308 kg)  BMI 23.57 kg/m2  SpO2 99%  Meds ordered this encounter  Medications  . sodium chloride 0.9 % bolus 1,000 mL    Sig:   . ondansetron (ZOFRAN) injection 4 mg    Sig:   . 0.9 %  sodium chloride infusion    Sig:   . pantoprazole (PROTONIX) injection 40 mg    Sig:   . 0.9 %  sodium chloride infusion    Sig:   . PRESCRIPTION MEDICATION    Sig: once a week.     [place], [place] 11/29/14 [phone]

## 2014-11-29 NOTE — Telephone Encounter (Signed)
Dewaine Oats called and stated that patient is in the hospital and would not be making it to treatment today 11/29/14.

## 2014-11-29 NOTE — Progress Notes (Signed)
Utilization review completed.  

## 2014-11-29 NOTE — ED Notes (Signed)
Patient states he fell this morning while ambulating back from restroom. Unknown about if patient lost conscious. Patients spouse reports pt was holding on her shoulders from behind her while ambulating. All of sudden, he feel. Pt's spouse thinks he hit his back on the toilet and toilet paper holder. Vomited started right after the fall. Also, states he fell 4 times yesterday. Spouse attempted to speak with oncologist yesterday concerning patient falling and being weak.

## 2014-11-30 ENCOUNTER — Encounter (HOSPITAL_COMMUNITY): Payer: Self-pay | Admitting: Gastroenterology

## 2014-11-30 ENCOUNTER — Inpatient Hospital Stay (HOSPITAL_COMMUNITY): Payer: Medicare Other

## 2014-11-30 ENCOUNTER — Encounter (HOSPITAL_COMMUNITY)
Admission: EM | Disposition: A | Discharge status: Home Health | Payer: Self-pay | Source: Home / Self Care | Attending: Internal Medicine

## 2014-11-30 ENCOUNTER — Inpatient Hospital Stay (HOSPITAL_COMMUNITY): Payer: Medicare Other | Admitting: *Deleted

## 2014-11-30 DIAGNOSIS — E43 Unspecified severe protein-calorie malnutrition: Secondary | ICD-10-CM | POA: Diagnosis present

## 2014-11-30 DIAGNOSIS — K264 Chronic or unspecified duodenal ulcer with hemorrhage: Secondary | ICD-10-CM | POA: Diagnosis present

## 2014-11-30 DIAGNOSIS — K823 Fistula of gallbladder: Secondary | ICD-10-CM | POA: Diagnosis not present

## 2014-11-30 DIAGNOSIS — J9601 Acute respiratory failure with hypoxia: Secondary | ICD-10-CM | POA: Diagnosis not present

## 2014-11-30 DIAGNOSIS — C3401 Malignant neoplasm of right main bronchus: Secondary | ICD-10-CM

## 2014-11-30 HISTORY — PX: LAPAROTOMY: SHX154

## 2014-11-30 HISTORY — PX: ESOPHAGOGASTRODUODENOSCOPY: SHX5428

## 2014-11-30 LAB — CBC WITH DIFFERENTIAL/PLATELET
Basophils Absolute: 0 10*3/uL (ref 0.0–0.1)
Basophils Relative: 0 % (ref 0–1)
EOS ABS: 0 10*3/uL (ref 0.0–0.7)
Eosinophils Relative: 0 % (ref 0–5)
HCT: 22.9 % — ABNORMAL LOW (ref 39.0–52.0)
Hemoglobin: 7.5 g/dL — ABNORMAL LOW (ref 13.0–17.0)
LYMPHS PCT: 26 % (ref 12–46)
Lymphs Abs: 0.7 10*3/uL (ref 0.7–4.0)
MCH: 28.7 pg (ref 26.0–34.0)
MCHC: 32.8 g/dL (ref 30.0–36.0)
MCV: 87.7 fL (ref 78.0–100.0)
Monocytes Absolute: 0.2 10*3/uL (ref 0.1–1.0)
Monocytes Relative: 8 % (ref 3–12)
NEUTROS PCT: 66 % (ref 43–77)
Neutro Abs: 1.7 10*3/uL (ref 1.7–7.7)
Platelets: 80 10*3/uL — ABNORMAL LOW (ref 150–400)
RBC: 2.61 MIL/uL — ABNORMAL LOW (ref 4.22–5.81)
RDW: 18.4 % — ABNORMAL HIGH (ref 11.5–15.5)
WBC: 2.6 10*3/uL — ABNORMAL LOW (ref 4.0–10.5)

## 2014-11-30 LAB — BLOOD GAS, ARTERIAL
Acid-base deficit: 12.1 mmol/L — ABNORMAL HIGH (ref 0.0–2.0)
BICARBONATE: 13.5 meq/L — AB (ref 20.0–24.0)
Drawn by: 42624
FIO2: 0.4 %
MECHVT: 500 mL
O2 Saturation: 98.7 %
PCO2 ART: 31.1 mmHg — AB (ref 35.0–45.0)
PEEP: 5 cmH2O
PH ART: 7.261 — AB (ref 7.350–7.450)
Patient temperature: 98.6
RATE: 14 resp/min
TCO2: 12.7 mmol/L (ref 0–100)
pO2, Arterial: 124 mmHg — ABNORMAL HIGH (ref 80.0–100.0)

## 2014-11-30 LAB — COMPREHENSIVE METABOLIC PANEL
ALK PHOS: 41 U/L (ref 39–117)
ALK PHOS: 47 U/L (ref 39–117)
ALT: 9 U/L (ref 0–53)
ALT: 97 U/L — ABNORMAL HIGH (ref 0–53)
AST: 10 U/L (ref 0–37)
AST: 97 U/L — ABNORMAL HIGH (ref 0–37)
Albumin: 2.1 g/dL — ABNORMAL LOW (ref 3.5–5.2)
Albumin: 2.1 g/dL — ABNORMAL LOW (ref 3.5–5.2)
Anion gap: 12 (ref 5–15)
Anion gap: 14 (ref 5–15)
BUN: 26 mg/dL — ABNORMAL HIGH (ref 6–23)
BUN: 25 mg/dL — AB (ref 6–23)
CHLORIDE: 105 meq/L (ref 96–112)
CO2: 18 meq/L — AB (ref 19–32)
CO2: 14 mEq/L — ABNORMAL LOW (ref 19–32)
Calcium: 7.2 mg/dL — ABNORMAL LOW (ref 8.4–10.5)
Calcium: 6.6 mg/dL — ABNORMAL LOW (ref 8.4–10.5)
Chloride: 112 mEq/L (ref 96–112)
Creatinine, Ser: 0.87 mg/dL (ref 0.50–1.35)
Creatinine, Ser: 0.75 mg/dL (ref 0.50–1.35)
GFR calc Af Amer: >90 mL/min (ref >90)
GFR calc non Af Amer: >90 mL/min (ref >90)
GFR, EST NON AFRICAN AMERICAN: 87 mL/min — AB (ref >90)
GLUCOSE: 167 mg/dL — AB (ref 70–99)
Glucose, Bld: 250 mg/dL — ABNORMAL HIGH (ref 70–99)
POTASSIUM: 3.7 meq/L (ref 3.7–5.3)
POTASSIUM: 4.6 meq/L (ref 3.7–5.3)
SODIUM: 135 meq/L — AB (ref 137–147)
SODIUM: 140 meq/L (ref 137–147)
Total Bilirubin: 0.8 mg/dL (ref 0.3–1.2)
Total Bilirubin: 1.8 mg/dL — ABNORMAL HIGH (ref 0.3–1.2)
Total Protein: 4.1 g/dL — ABNORMAL LOW (ref 6.0–8.3)
Total Protein: 4 g/dL — ABNORMAL LOW (ref 6.0–8.3)

## 2014-11-30 LAB — POCT I-STAT 7, (LYTES, BLD GAS, ICA,H+H)
ACID-BASE DEFICIT: 10 mmol/L — AB (ref 0.0–2.0)
BICARBONATE: 17.3 meq/L — AB (ref 20.0–24.0)
Calcium, Ion: 1.16 mmol/L (ref 1.13–1.30)
HEMATOCRIT: 19 % — AB (ref 39.0–52.0)
Hemoglobin: 6.5 g/dL — CL (ref 13.0–17.0)
O2 Saturation: 100 %
PCO2 ART: 44.7 mmHg (ref 35.0–45.0)
PO2 ART: 284 mmHg — AB (ref 80.0–100.0)
Patient temperature: 37.1
Potassium: 4.4 mEq/L (ref 3.7–5.3)
Sodium: 137 mEq/L (ref 137–147)
TCO2: 19 mmol/L (ref 0–100)
pH, Arterial: 7.195 — CL (ref 7.350–7.450)

## 2014-11-30 LAB — CBC
HCT: 16.8 % — ABNORMAL LOW (ref 39.0–52.0)
HCT: 33.6 % — ABNORMAL LOW (ref 39.0–52.0)
HEMOGLOBIN: 5.7 g/dL — AB (ref 13.0–17.0)
Hemoglobin: 11.3 g/dL — ABNORMAL LOW (ref 13.0–17.0)
MCH: 30.5 pg (ref 26.0–34.0)
MCH: 29.2 pg (ref 26.0–34.0)
MCHC: 33.9 g/dL (ref 30.0–36.0)
MCHC: 33.6 g/dL (ref 30.0–36.0)
MCV: 89.8 fL (ref 78.0–100.0)
MCV: 86.8 fL (ref 78.0–100.0)
PLATELETS: 127 10*3/uL — AB (ref 150–400)
Platelets: 86 10*3/uL — ABNORMAL LOW (ref 150–400)
RBC: 1.87 MIL/uL — AB (ref 4.22–5.81)
RBC: 3.87 MIL/uL — AB (ref 4.22–5.81)
RDW: 19.2 % — ABNORMAL HIGH (ref 11.5–15.5)
RDW: 15.3 % (ref 11.5–15.5)
WBC: 3.1 10*3/uL — ABNORMAL LOW (ref 4.0–10.5)
WBC: 4.2 10*3/uL (ref 4.0–10.5)

## 2014-11-30 LAB — LACTIC ACID, PLASMA: Lactic Acid, Venous: 1.3 mmol/L (ref 0.5–2.2)

## 2014-11-30 LAB — GLUCOSE, CAPILLARY
GLUCOSE-CAPILLARY: 122 mg/dL — AB (ref 70–99)
GLUCOSE-CAPILLARY: 162 mg/dL — AB (ref 70–99)
GLUCOSE-CAPILLARY: 231 mg/dL — AB (ref 70–99)
Glucose-Capillary: 212 mg/dL — ABNORMAL HIGH (ref 70–99)

## 2014-11-30 LAB — PREPARE RBC (CROSSMATCH)

## 2014-11-30 LAB — PROTIME-INR
INR: 1.32 (ref 0.00–1.49)
PROTHROMBIN TIME: 16.5 s — AB (ref 11.6–15.2)

## 2014-11-30 SURGERY — LAPAROTOMY, EXPLORATORY
Anesthesia: General | Site: Abdomen

## 2014-11-30 SURGERY — EGD (ESOPHAGOGASTRODUODENOSCOPY)
Anesthesia: General

## 2014-11-30 MED ORDER — SUCCINYLCHOLINE CHLORIDE 20 MG/ML IJ SOLN
INTRAMUSCULAR | Status: DC | PRN
  Administered 2014-11-30: 100 mg via INTRAVENOUS

## 2014-11-30 MED ORDER — MIDAZOLAM HCL 2 MG/2ML IJ SOLN
INTRAMUSCULAR | Status: AC
  Filled 2014-11-30: qty 2

## 2014-11-30 MED ORDER — SODIUM CHLORIDE 0.9 % IV SOLN
Freq: Once | INTRAVENOUS | Status: AC
  Administered 2014-11-30: 07:00:00 via INTRAVENOUS

## 2014-11-30 MED ORDER — FENTANYL CITRATE 0.05 MG/ML IJ SOLN: 50.0000 ug | Freq: Once | INTRAMUSCULAR | Status: DC

## 2014-11-30 MED ORDER — FENTANYL CITRATE 0.05 MG/ML IJ SOLN
INTRAMUSCULAR | Status: AC
  Filled 2014-11-30: qty 2

## 2014-11-30 MED ORDER — CEFAZOLIN SODIUM-DEXTROSE 2-3 GM-% IV SOLR: 2.0000 g | Freq: Once | INTRAVENOUS | Status: DC

## 2014-11-30 MED ORDER — PROPOFOL 10 MG/ML IV BOLUS
INTRAVENOUS | Status: DC | PRN
  Administered 2014-11-30: 100 mg via INTRAVENOUS

## 2014-11-30 MED ORDER — FENTANYL BOLUS VIA INFUSION
25.0000 ug | INTRAVENOUS | Status: DC | PRN
  Filled 2014-11-30: qty 25

## 2014-11-30 MED ORDER — SODIUM CHLORIDE 0.9 % IV BOLUS (SEPSIS)
500.0000 mL | Freq: Once | INTRAVENOUS | Status: AC
  Administered 2014-11-30: 500 mL via INTRAVENOUS

## 2014-11-30 MED ORDER — LACTATED RINGERS IV SOLN
INTRAVENOUS | Status: DC | PRN
  Administered 2014-11-30: 13:00:00 via INTRAVENOUS

## 2014-11-30 MED ORDER — CEFAZOLIN SODIUM-DEXTROSE 2-3 GM-% IV SOLR
INTRAVENOUS | Status: DC | PRN
  Administered 2014-11-30: 2 g via INTRAVENOUS

## 2014-11-30 MED ORDER — LIDOCAINE HCL 1 % IJ SOLN
INTRAMUSCULAR | Status: DC | PRN
  Administered 2014-11-30: 50 mg via INTRADERMAL

## 2014-11-30 MED ORDER — MORPHINE SULFATE 2 MG/ML IJ SOLN
2.0000 mg | INTRAMUSCULAR | Status: DC | PRN
  Administered 2014-12-01 (×6): 2 mg via INTRAVENOUS
  Administered 2014-12-02 (×2): 4 mg via INTRAVENOUS
  Filled 2014-11-30 (×2): qty 1
  Filled 2014-11-30: qty 2
  Filled 2014-11-30 (×2): qty 1
  Filled 2014-11-30: qty 2
  Filled 2014-11-30 (×2): qty 1

## 2014-11-30 MED ORDER — MIDAZOLAM HCL 5 MG/5ML IJ SOLN
INTRAMUSCULAR | Status: DC | PRN
  Administered 2014-11-30: 1 mg via INTRAVENOUS
  Administered 2014-11-30: 2 mg via INTRAVENOUS
  Administered 2014-11-30: 1 mg via INTRAVENOUS

## 2014-11-30 MED ORDER — SODIUM CHLORIDE 0.9 % IV SOLN
INTRAVENOUS | Status: DC | PRN
  Administered 2014-11-30: 14:00:00 via INTRAVENOUS

## 2014-11-30 MED ORDER — FENTANYL CITRATE 0.05 MG/ML IJ SOLN
INTRAMUSCULAR | Status: AC
Start: 2014-11-30 — End: 2014-11-30
  Filled 2014-11-30: qty 2

## 2014-11-30 MED ORDER — CHLORHEXIDINE GLUCONATE 0.12 % MT SOLN
15.0000 mL | Freq: Two times a day (BID) | OROMUCOSAL | Status: DC
  Administered 2014-12-01 – 2014-12-21 (×32): 15 mL via OROMUCOSAL
  Filled 2014-11-30 (×40): qty 15

## 2014-11-30 MED ORDER — SODIUM CHLORIDE 0.9 % IJ SOLN
INTRAMUSCULAR | Status: DC | PRN
  Administered 2014-11-30: 4 mL

## 2014-11-30 MED ORDER — INSULIN ASPART 100 UNIT/ML ~~LOC~~ SOLN
0.0000 [IU] | SUBCUTANEOUS | Status: DC
  Administered 2014-11-30 (×2): 5 [IU] via SUBCUTANEOUS
  Administered 2014-12-01 (×4): 3 [IU] via SUBCUTANEOUS
  Administered 2014-12-01: 2 [IU] via SUBCUTANEOUS
  Administered 2014-12-01 – 2014-12-02 (×2): 3 [IU] via SUBCUTANEOUS
  Administered 2014-12-02 (×2): 2 [IU] via SUBCUTANEOUS
  Administered 2014-12-02: 3 [IU] via SUBCUTANEOUS
  Administered 2014-12-02 – 2014-12-03 (×3): 2 [IU] via SUBCUTANEOUS
  Administered 2014-12-03: 3 [IU] via SUBCUTANEOUS
  Administered 2014-12-03 (×2): 2 [IU] via SUBCUTANEOUS
  Administered 2014-12-03: 3 [IU] via SUBCUTANEOUS
  Administered 2014-12-03: 2 [IU] via SUBCUTANEOUS
  Administered 2014-12-04 (×2): 3 [IU] via SUBCUTANEOUS
  Administered 2014-12-04: 2 [IU] via SUBCUTANEOUS
  Administered 2014-12-04 – 2014-12-05 (×8): 3 [IU] via SUBCUTANEOUS
  Administered 2014-12-06 (×3): 5 [IU] via SUBCUTANEOUS
  Administered 2014-12-06 – 2014-12-07 (×3): 3 [IU] via SUBCUTANEOUS
  Administered 2014-12-07 (×2): 2 [IU] via SUBCUTANEOUS
  Administered 2014-12-07: 5 [IU] via SUBCUTANEOUS
  Administered 2014-12-07: 3 [IU] via SUBCUTANEOUS
  Administered 2014-12-07: 5 [IU] via SUBCUTANEOUS
  Administered 2014-12-07 – 2014-12-08 (×2): 3 [IU] via SUBCUTANEOUS
  Administered 2014-12-08 (×2): 2 [IU] via SUBCUTANEOUS
  Administered 2014-12-08: 3 [IU] via SUBCUTANEOUS
  Administered 2014-12-09 – 2014-12-12 (×14): 2 [IU] via SUBCUTANEOUS
  Administered 2014-12-13 (×4): 3 [IU] via SUBCUTANEOUS
  Administered 2014-12-13 – 2014-12-14 (×3): 2 [IU] via SUBCUTANEOUS
  Administered 2014-12-14 (×2): 3 [IU] via SUBCUTANEOUS
  Administered 2014-12-14 – 2014-12-15 (×3): 2 [IU] via SUBCUTANEOUS
  Administered 2014-12-15 (×4): 3 [IU] via SUBCUTANEOUS
  Administered 2014-12-15 – 2014-12-16 (×2): 2 [IU] via SUBCUTANEOUS
  Administered 2014-12-16: 3 [IU] via SUBCUTANEOUS
  Administered 2014-12-16: 5 [IU] via SUBCUTANEOUS
  Administered 2014-12-16 – 2014-12-17 (×2): 2 [IU] via SUBCUTANEOUS
  Administered 2014-12-17 (×2): 3 [IU] via SUBCUTANEOUS
  Administered 2014-12-17 (×3): 2 [IU] via SUBCUTANEOUS
  Administered 2014-12-18: 3 [IU] via SUBCUTANEOUS
  Administered 2014-12-18: 5 [IU] via SUBCUTANEOUS
  Administered 2014-12-18 – 2014-12-19 (×4): 3 [IU] via SUBCUTANEOUS
  Administered 2014-12-19 (×2): 2 [IU] via SUBCUTANEOUS
  Administered 2014-12-19 (×2): 3 [IU] via SUBCUTANEOUS
  Administered 2014-12-19: 2 [IU] via SUBCUTANEOUS
  Administered 2014-12-20 (×3): 3 [IU] via SUBCUTANEOUS
  Administered 2014-12-20: 2 [IU] via SUBCUTANEOUS
  Administered 2014-12-20: 3 [IU] via SUBCUTANEOUS
  Administered 2014-12-20: 2 [IU] via SUBCUTANEOUS
  Administered 2014-12-21: 5 [IU] via SUBCUTANEOUS
  Administered 2014-12-21: 2 [IU] via SUBCUTANEOUS
  Administered 2014-12-21: 5 [IU] via SUBCUTANEOUS

## 2014-11-30 MED ORDER — CISATRACURIUM BESYLATE 20 MG/10ML IV SOLN
INTRAVENOUS | Status: AC
  Filled 2014-11-30: qty 10

## 2014-11-30 MED ORDER — SODIUM CHLORIDE 0.9 % IV SOLN
INTRAVENOUS | Status: DC
  Administered 2014-11-30 – 2014-12-01 (×2): via INTRAVENOUS

## 2014-11-30 MED ORDER — FENTANYL CITRATE 0.05 MG/ML IJ SOLN
INTRAMUSCULAR | Status: DC | PRN
  Administered 2014-11-30 (×2): 50 ug via INTRAVENOUS
  Administered 2014-11-30: 100 ug via INTRAVENOUS
  Administered 2014-11-30 (×2): 50 ug via INTRAVENOUS

## 2014-11-30 MED ORDER — LABETALOL HCL 5 MG/ML IV SOLN
20.0000 mg | INTRAVENOUS | Status: DC | PRN
  Filled 2014-11-30: qty 4

## 2014-11-30 MED ORDER — SODIUM CHLORIDE 0.9 % IV SOLN
INTRAVENOUS | Status: DC | PRN
  Administered 2014-11-30 (×2): via INTRAVENOUS

## 2014-11-30 MED ORDER — ONDANSETRON HCL 4 MG/2ML IJ SOLN
4.0000 mg | INTRAMUSCULAR | Status: DC | PRN
Start: 2014-11-30 — End: 2014-12-21
  Administered 2014-12-01 – 2014-12-12 (×6): 4 mg via INTRAVENOUS
  Filled 2014-11-30 (×6): qty 2

## 2014-11-30 MED ORDER — CETYLPYRIDINIUM CHLORIDE 0.05 % MT LIQD
7.0000 mL | Freq: Four times a day (QID) | OROMUCOSAL | Status: DC
  Administered 2014-12-01 (×2): 7 mL via OROMUCOSAL

## 2014-11-30 MED ORDER — SODIUM CHLORIDE 0.9 % IV SOLN: Freq: Once | INTRAVENOUS | Status: DC

## 2014-11-30 MED ORDER — PANTOPRAZOLE SODIUM 40 MG IV SOLR
40.0000 mg | Freq: Two times a day (BID) | INTRAVENOUS | Status: DC
  Administered 2014-11-30 – 2014-12-15 (×30): 40 mg via INTRAVENOUS
  Filled 2014-11-30 (×30): qty 40

## 2014-11-30 MED ORDER — PHENYLEPHRINE HCL 10 MG/ML IJ SOLN
INTRAMUSCULAR | Status: AC
  Filled 2014-11-30: qty 1

## 2014-11-30 MED ORDER — SODIUM CHLORIDE 0.9 % IV SOLN
INTRAVENOUS | Status: DC | PRN
  Administered 2014-11-30: 13:00:00 via INTRAVENOUS

## 2014-11-30 MED ORDER — PHENYLEPHRINE HCL 10 MG/ML IJ SOLN
INTRAMUSCULAR | Status: DC | PRN
  Administered 2014-11-30 (×2): 100 ug via INTRAVENOUS
  Administered 2014-11-30: 80 ug via INTRAVENOUS

## 2014-11-30 MED ORDER — ONDANSETRON HCL 4 MG PO TABS: 4.0000 mg | ORAL_TABLET | Freq: Four times a day (QID) | ORAL | Status: DC | PRN

## 2014-11-30 MED ORDER — CEFAZOLIN SODIUM 1-5 GM-% IV SOLN
1.0000 g | Freq: Four times a day (QID) | INTRAVENOUS | Status: AC
  Administered 2014-11-30 – 2014-12-01 (×3): 1 g via INTRAVENOUS
  Filled 2014-11-30 (×4): qty 50

## 2014-11-30 MED ORDER — SODIUM CHLORIDE 0.9 % IV SOLN
25.0000 ug/h | INTRAVENOUS | Status: DC
  Administered 2014-11-30: 50 ug/h via INTRAVENOUS
  Administered 2014-11-30: 100 ug/h via INTRAVENOUS
  Administered 2014-11-30: 50 ug/h via INTRAVENOUS
  Administered 2014-11-30: 100 ug/h via INTRAVENOUS
  Administered 2014-11-30: 50 ug/h via INTRAVENOUS
  Administered 2014-12-01: 250 ug/h via INTRAVENOUS
  Filled 2014-11-30 (×2): qty 50

## 2014-11-30 MED ORDER — CHLORHEXIDINE GLUCONATE 0.12 % MT SOLN: 15.0000 mL | Freq: Two times a day (BID) | OROMUCOSAL | Status: DC

## 2014-11-30 MED ORDER — MIDAZOLAM HCL 2 MG/2ML IJ SOLN
1.0000 mg | INTRAMUSCULAR | Status: DC | PRN
  Administered 2014-11-30 – 2014-12-01 (×3): 2 mg via INTRAVENOUS
  Filled 2014-11-30 (×2): qty 2

## 2014-11-30 MED ORDER — LACTATED RINGERS IV SOLN
INTRAVENOUS | Status: DC | PRN
Start: 2014-11-30 — End: 2014-11-30
  Administered 2014-11-30: 16:00:00 via INTRAVENOUS

## 2014-11-30 MED ORDER — LIDOCAINE HCL (CARDIAC) 20 MG/ML IV SOLN
INTRAVENOUS | Status: AC
  Filled 2014-11-30: qty 5

## 2014-11-30 MED ORDER — CISATRACURIUM BESYLATE (PF) 10 MG/5ML IV SOLN
INTRAVENOUS | Status: DC | PRN
  Administered 2014-11-30: 4 mg via INTRAVENOUS
  Administered 2014-11-30: 10 mg via INTRAVENOUS
  Administered 2014-11-30: 4 mg via INTRAVENOUS
  Administered 2014-11-30 (×2): 6 mg via INTRAVENOUS

## 2014-11-30 MED ORDER — SODIUM CHLORIDE 0.9 % IV SOLN
INTRAVENOUS | Status: DC
  Administered 2014-11-30: 10 mL via INTRAVENOUS
  Administered 2014-12-05 – 2014-12-17 (×2): via INTRAVENOUS

## 2014-11-30 MED ORDER — 0.9 % SODIUM CHLORIDE (POUR BTL) OPTIME
TOPICAL | Status: DC | PRN
  Administered 2014-11-30 (×6): 1000 mL

## 2014-11-30 SURGICAL SUPPLY — 55 items
APPLICATOR COTTON TIP 6IN STRL (MISCELLANEOUS) ×6 IMPLANT
APPLIER CLIP 5 13 M/L LIGAMAX5 (MISCELLANEOUS) ×3
APPLIER CLIP ROT 10 11.4 M/L (STAPLE) ×3
BAG URINE DRAINAGE (UROLOGICAL SUPPLIES) ×3 IMPLANT
BLADE EXTENDED COATED 6.5IN (ELECTRODE) ×3 IMPLANT
BLADE HEX COATED 2.75 (ELECTRODE) ×3 IMPLANT
CANISTER SUCT 3000ML (MISCELLANEOUS) IMPLANT
CATH FOLEY 2WAY SLVR  5CC 22FR (CATHETERS) ×2
CATH FOLEY 2WAY SLVR 30CC 24FR (CATHETERS) ×3 IMPLANT
CATH FOLEY 2WAY SLVR 5CC 22FR (CATHETERS) ×1 IMPLANT
CATH ROBINSON RED A/P 14FR (CATHETERS) ×3 IMPLANT
CLIP APPLIE 5 13 M/L LIGAMAX5 (MISCELLANEOUS) ×1 IMPLANT
CLIP APPLIE ROT 10 11.4 M/L (STAPLE) ×1 IMPLANT
COVER MAYO STAND STRL (DRAPES) ×3 IMPLANT
DRAIN CHANNEL 19F RND (DRAIN) ×3 IMPLANT
DRAPE LAPAROSCOPIC ABDOMINAL (DRAPES) ×3 IMPLANT
DRAPE UTILITY XL STRL (DRAPES) ×3 IMPLANT
DRAPE WARM FLUID 44X44 (DRAPE) ×3 IMPLANT
ELECT REM PT RETURN 9FT ADLT (ELECTROSURGICAL) ×3
ELECTRODE REM PT RTRN 9FT ADLT (ELECTROSURGICAL) ×1 IMPLANT
EVACUATOR DRAINAGE 10X20 100CC (DRAIN) ×1 IMPLANT
EVACUATOR SILICONE 100CC (DRAIN) ×2
GAUZE SPONGE 4X4 12PLY STRL (GAUZE/BANDAGES/DRESSINGS) ×3 IMPLANT
GLOVE ECLIPSE 8.0 STRL XLNG CF (GLOVE) ×6 IMPLANT
GLOVE INDICATOR 8.0 STRL GRN (GLOVE) ×9 IMPLANT
GOWN STRL REUS W/TWL XL LVL3 (GOWN DISPOSABLE) ×6 IMPLANT
KIT BASIN OR (CUSTOM PROCEDURE TRAY) ×3 IMPLANT
NS IRRIG 1000ML POUR BTL (IV SOLUTION) ×15 IMPLANT
PACK GENERAL/GYN (CUSTOM PROCEDURE TRAY) ×3 IMPLANT
SPONGE LAP 18X18 X RAY DECT (DISPOSABLE) ×6 IMPLANT
STAPLER VISISTAT 35W (STAPLE) ×3 IMPLANT
SUCTION POOLE TIP (SUCTIONS) ×3 IMPLANT
SUT ETHILON 3 0 PS 1 (SUTURE) ×3 IMPLANT
SUT ETHILON 4 0 PS 2 18 (SUTURE) ×3 IMPLANT
SUT PDS AB 1 CTX 36 (SUTURE) IMPLANT
SUT PDS AB 1 TP1 96 (SUTURE) ×6 IMPLANT
SUT PROLENE 4 0 RB 1 (SUTURE) ×10
SUT PROLENE 4-0 RB1 .5 CRCL 36 (SUTURE) ×5 IMPLANT
SUT SILK 2 0 (SUTURE) ×2
SUT SILK 2 0 SH CR/8 (SUTURE) ×3 IMPLANT
SUT SILK 2 0SH CR/8 30 (SUTURE) ×12 IMPLANT
SUT SILK 2-0 18XBRD TIE 12 (SUTURE) ×1 IMPLANT
SUT SILK 3 0 (SUTURE) ×2
SUT SILK 3 0 SH CR/8 (SUTURE) ×3 IMPLANT
SUT SILK 3-0 18XBRD TIE 12 (SUTURE) ×1 IMPLANT
SUT VIC AB 2-0 SH 27 (SUTURE) ×2
SUT VIC AB 2-0 SH 27X BRD (SUTURE) ×1 IMPLANT
SUT VIC AB 3-0 SH 18 (SUTURE) IMPLANT
SUT VICRYL 2 0 18  UND BR (SUTURE)
SUT VICRYL 2 0 18 UND BR (SUTURE) IMPLANT
SYRINGE 10CC LL (SYRINGE) ×3 IMPLANT
SYRINGE 60CC LL (MISCELLANEOUS) ×3 IMPLANT
TOWEL OR 17X26 10 PK STRL BLUE (TOWEL DISPOSABLE) ×6 IMPLANT
TOWEL OR NON WOVEN STRL DISP B (DISPOSABLE) ×3 IMPLANT
TRAY FOLEY CATH 14FRSI W/METER (CATHETERS) ×3 IMPLANT

## 2014-11-30 NOTE — Progress Notes (Signed)
CRITICAL VALUE ALERT  Critical value received:  5.7 hgb  Date of notification:  11/30/14  Time of notification:  0413  Critical value read back:Yes.    Nurse who received alert:  Cecile Sheerer, RN  NP notified (1st page): Lamar Blinks, NP  Time of first page: [phone]  See blood transfusion orders

## 2014-11-30 NOTE — Progress Notes (Signed)
   11/30/14 0000  Vitals  BP (!) 81/47 mmHg  MAP (mmHg) 57  Pulse Rate 91  ECG Heart Rate 94  Resp 15  Oxygen Therapy  SpO2 95 %   NP on call notified of blood pressure. Order given for 500cc NS bolus.

## 2014-11-30 NOTE — Op Note (Signed)
Operative Note  Daryl Vega male 68 y.o. 11/30/2014  PREOPERATIVE DX:  Bleeding duodenal ulcer  POSTOPERATIVE DX:  Same with cholecystoduodenal fistula  PROCEDURE:   Emergency exploratory laparotomy, oversewing of posterior bleeding duodenal ulcer, Heineke Mikulicz pyloroplasty, cholecystectomy, repair of right hepatic artery, repair of cholecystoduodenal fistula and placement of duodenostomy tube, feeding jejunostomy.         Surgeon: Odis Hollingshead   Assistants: Erroll Luna, M.D.  Anesthesia: General endotracheal anesthesia  Indications:   This is a 68 year old male with lung cancer currently being treated with chemotherapy. He was admitted with an upper GI bleed. Upper endoscopy was performed which demonstrated duodenal ulcer with an actively bleeding blood vessel that could not be controlled with endoscopic intervention. He was in hemorrhagic shock. He is now brought to the operating room for emergency  laparotomy.    Procedure Detail:  He was already intubated and endoscopy. Brought to the operating and placed upon the operating table and general anesthetic was given. Foley catheter was inserted. The abdomen was widely sterilely prepped and draped. An upper midline incision was made and extended just below the umbilicus. Skin, subcutaneous tissue, fascia, and peritoneum were divided. Upon entering the peritoneal cavity and the transverse colon was discolored bu intraluminal blood. A self-retaining retractor was placed. The gallbladder is noted to have a very large stone in it. The gallbladder is densely adherent to the anterior part of the second portion the duodenum. This area is very fibrotic and adherent to the portal triad structures. I carefully dissected the gallbladder free from the duodenum.  A cholecystoduodenal fistula was noted.  I performed a Kocher maneuver. And then made an incision in the anterior aspect of the first portion of duodenum and extended this  through the pylorus onto the stomach. There is very dense fibrotic ulcerated duodenal areas anteriorly. Deep posteriorly at the junction of the first and second portions of the duodenum was an actively bleeding ulcer. This appeared to be an ulcer that had eroded into the gastroduodenal artery. I controlled the bleeding with interrupted 2-0 silk sutures.  Once this was done, a Heineke-Mikulicz pyloroplasty was performed to close the defect using interrupted 2-0 silk sutures.  Next, I started the cholecystectomy given the fact that there was a cholecystoduodenal fistula. A large stone was present but I believe the fistula was secondary to chronic ulcer disease. I isolated the cystic duct and created a window around it. It was divided and ligated with silk suture and a clip. I then began at the fundus and dissected the gallbladder free from the liver. A cystic artery was identified and appeared to be fairly friable and very close to the right hepatic artery. This was clipped but the lateral aspect of the right hepatic artery  began bleeding. I repaired the small lateral defect in the right hepatic artery with interrupted 4-0 Prolene sutures.  Following this, the cholecystoduodenal fistula was approached. This whole area was very fibrotic. I did not feel that primary closure would be successful. Subsequently a 96 French Foley was placed into the fistula tract. I then primarily closed as much as the fistula defect in the duodenum as I could using interrupted silk sutures. A pursestring suture of 0 Vicryl was placed around the duodenal area and tied loosely to the 22 [place] Foley. This was then brought out the right upper quadrant anterior abdominal wall. 5 cc was placed in the balloon. A stab incision was made in the right lower quadrant. A 19 Blake  drain was then placed into the peritoneal cavity adjacent to the cholecystoduodenal fistula repair.  The abdominal cavity was then copiously irrigated with saline  solution. The area was hemostatic. There is no evidence of bowel injury or bile leak at this time. I decided to perform a feeding jejunostomy as I do not think he is going to be able to have oral intake for a prolonged period of time. The ligament of Treitz was identified and I went approximately 40-50 cm distal to this. A pursestring suture was in place in the antimesenteric aspect of the jejunum. A stab incision was made in the left mid abdomen. A 14 French red rubber tube was passed into the abdomen. Small extra holes were cut into it. An enterotomy was made and the tube was placed into the jejunum and threaded approximately 15-20 cm distally. The pursestring suture was then tied down. A 2 cm Witzel maneuver was done. I then anchored the jejunum to the anterior abdominal wall with silk sutures.  The jejunostomy tube, the Blake drain, and the duodenostomy tube were all anchored to the abdominal wall with nylon suture. The NG tube was verified in the stomach. The fascia was then closed with running double looped #1 PDS suture. Needle sponge and its counts reported be correct. Subcutaneous tissues irrigated and the skin was closed with staples.  He tolerated the procedure well and was taken to the intensive care unit intubated and in critical condition.  Estimated Blood Loss:  2000 ml         Drains: duodenostomy, jejunostomy, 19 Blake         Specimens: gallbladder

## 2014-11-30 NOTE — Transfer of Care (Addendum)
Immediate Anesthesia Transfer of Care Note  Patient: Daryl Vega  Procedure(s) Performed: Procedure(s): ESOPHAGOGASTRODUODENOSCOPY (EGD) (N/A)  Patient Location: ICU 1231  Anesthesia Type:General  Level of Consciousness: sedated and unresponsive  Airway & Oxygen Therapy: Patient remains intubated per anesthesia plan  Post-op Assessment: Post -op Vital signs reviewed and stable  Post vital signs: Reviewed and stable  Complications: No apparent anesthesia complications

## 2014-11-30 NOTE — Progress Notes (Signed)
eLink Physician-Brief Progress Note Patient Name: Slayton Lubitz DOB: [birth year] MRN: [id number]   Date of Service  11/30/2014  HPI/Events of Note  Consult received from [name]; massive GI bleed from cholecystoduodenal ulcer/fistula now resected, vented post op.  Received 8 U PRBC and 4 U FFP  eICU Interventions  Cbc and CMET now Vent orders and sedation orders placed On ground PCCM MD to see Will change primary service to PCCM     Intervention Category Evaluation Type: New Patient Evaluation  MCQUAID, DOUGLAS 11/30/2014, 5:57 PM

## 2014-11-30 NOTE — Progress Notes (Signed)
DIAGNOSIS: Stage IIIB (T3, N3, M0) non-small cell lung cancer, squamous cell carcinoma presenting with right upper lobe obstructing mass as well as mediastinal and right supraclavicular lymphadenopathy diagnosed in August of 2015.  PRIOR THERAPY: status post palliative radiotherapy to the obstructing lung mass in the right upper lobe under the care of Dr. [name] completed on 08/23/2014.  CURRENT THERAPY: 1) systemic chemotherapy with carboplatin for AUC of 5 on day 1 and Abraxane 100 mg/M2 on days 1, 8 and 15 every 3 weeks. First dose 08/30/2014. Status post 4 cycles.   Subjective: The patient is seen and examined today. He continues to complain of increasing fatigue and weakness. He was admitted yesterday with severe anemia secondary to gastrointestinal bleed. He is scheduled to have upper endoscopy by Dr. [name] later today. He denied having any fever or chills, no nausea or vomiting.  Objective: Vital signs in last 24 hours: Temp:  [97.7 F (36.5 C)-99 F (37.2 C)] 97.7 F (36.5 C) (12/17 0745) Pulse Rate:  [89-122] 105 (12/17 0800) Resp:  [11-45] 18 (12/17 0800) BP: (72-108)/(34-69) 90/54 mmHg (12/17 0800) SpO2:  [91 %-99 %] 97 % (12/17 0842)  Intake/Output from previous day: 12/16 0701 - 12/17 0700 In: 4634.2 [P.O.:320; I.V.:2734.7; Blood:1429.5; IV Piggyback:150] Out: 200 [Urine:200] Intake/Output this shift: Total I/O In: 418.5 [I.V.:125; Blood:293.5] Out: 300 [Urine:300]  General appearance: alert, cooperative, fatigued and no distress Resp: clear to auscultation bilaterally Cardio: regular rate and rhythm, S1, S2 normal, no murmur, click, rub or gallop GI: soft, non-tender; bowel sounds normal; no masses,  no organomegaly Extremities: extremities normal, atraumatic, no cyanosis or edema  Lab Results:   Recent Labs  11/29/14 1556 11/30/14 0326  WBC 3.6* 3.1*  HGB 7.7* 5.7*  HCT 22.5* 16.8*  PLT 143* 127*   BMET  Recent Labs  11/29/14 0644 11/29/14 0713  11/30/14 0326  NA 134* 136* 135*  K 3.3* 3.1* 3.7  CL 97 102 105  CO2 24  --  18*  GLUCOSE 250* 232* 167*  BUN 30* 26* 26*  CREATININE 0.90 0.80 0.87  CALCIUM 8.7  --  7.2*    Studies/Results: No results found.  Medications: I have reviewed the patient's current medications.  Assessment/Plan: 1) stage IIIB non-small cell lung cancer currently undergoing systemic chemotherapy was carboplatin and Abraxane status post 4 cycles and currently undergoing cycle #5. I would hold his systemic chemotherapy for now until improvement in his condition. 2) severe anemia secondary to gastrointestinal bleed. The patient is scheduled for upper endoscopy today. His wife told the emergency department physician and hospitalist that she had called the [place] for 2 days before the admission regarding rectal bleeding. I checked with the desk nurses as well as the triage nurses regarding any missed messages from the wife and they indicated that they have not received any communication from the wife since his last visit to the office a week before. The patient may need PRBCs transfusion to keep his hemoglobin above 8.0 g/dL. Thank you for taking good care of Mr. Marple, I will continue to follow up the patient with you and assist in his management on as-needed basis.  LOS: 1 day    Philipe Laswell K. 11/30/2014

## 2014-11-30 NOTE — Interval H&P Note (Signed)
History and Physical Interval Note:  11/30/2014 12:21 PM  Daryl Vega  has presented today for surgery, with the diagnosis of hematemesis, anemia  The various methods of treatment have been discussed with the patient and family. After consideration of risks, benefits and other options for treatment, the patient has consented to  Procedure(s): ESOPHAGOGASTRODUODENOSCOPY (EGD) (N/A) as a surgical intervention .  The patient's history has been reviewed, patient examined, no change in status, stable for surgery.  I have reviewed the patient's chart and labs.  Questions were answered to the patient's satisfaction.     [name]

## 2014-11-30 NOTE — Anesthesia Postprocedure Evaluation (Signed)
Anesthesia Post Note  Patient: Daryl Vega  Procedure(s) Performed: Procedure(s) (LRB): ESOPHAGOGASTRODUODENOSCOPY (EGD) (N/A)  Anesthesia type: General  Patient location: ICU  Post pain: Sedated and intubated  Post assessment: Post-op Vital signs reviewed  Last Vitals: BP 168/98 mmHg  Pulse 125  Temp(Src) 37.1 C (Oral)  Resp 22  Ht 5\' 8"  (1.727 m)  Wt 155 lb (70.308 kg)  BMI 23.57 kg/m2  SpO2 97%  Post vital signs: Reviewed  Level of consciousness: sedated  Complications: No apparent anesthesia complications

## 2014-11-30 NOTE — Anesthesia Preprocedure Evaluation (Addendum)
Anesthesia Evaluation  Patient identified by MRN, date of birth, ID band Patient awake    Reviewed: Allergy & Precautions, H&P , NPO status , Patient's Chart, lab work & pertinent test results  Airway Mallampati: II  TM Distance: >3 FB Neck ROM: Full    Dental no notable dental hx.    Pulmonary shortness of breath, pneumonia -, COPDformer smoker,  breath sounds clear to auscultation  Pulmonary exam normal       Cardiovascular negative cardio ROS  Rhythm:Regular Rate:Normal     Neuro/Psych negative neurological ROS  negative psych ROS   GI/Hepatic negative GI ROS, Neg liver ROS,   Endo/Other  diabetes  Renal/GU negative Renal ROS     Musculoskeletal negative musculoskeletal ROS (+)   Abdominal   Peds  Hematology  (+) anemia ,   Anesthesia Other Findings   Reproductive/Obstetrics negative OB ROS                           Anesthesia Physical Anesthesia Plan  ASA: III and emergent  Anesthesia Plan: General   Post-op Pain Management:    Induction: Intravenous and Rapid sequence  Airway Management Planned: Oral ETT  Additional Equipment:   Intra-op Plan:   Post-operative Plan: Extubation in OR  Informed Consent: I have reviewed the patients History and Physical, chart, labs and discussed the procedure including the risks, benefits and alternatives for the proposed anesthesia with the patient or authorized representative who has indicated his/her understanding and acceptance.   Dental advisory given  Plan Discussed with: CRNA  Anesthesia Plan Comments:       Anesthesia Quick Evaluation

## 2014-11-30 NOTE — Consult Note (Signed)
Reason for Consult:  Bleeding duodenal ulcer Referring Physician: Dr. Jacqulyn Cane Sivertson is an 68 y.o. male.  HPI:  I was asked to perform an emergency consultation in this gentleman because of a briskly bleeding duodenal ulcer.  He has non-small cell lung cancer and is undergoing chemotherapy. He was having some hematemesis and melena. He was admitted to the hospital yesterday. He was noted to be anemic. He is brought to endoscopy today and noted to have a bleeding duodenal ulcer with a visible vessel. Endoscopic maneuvers were attempted to control the bleeding but the bleeding became more brisk. I was asked to see him at this time. He is intubated and in the endoscopy at this time.  Past Medical History  Diagnosis Date  . DM (diabetes mellitus)   . Pneumonia   . Hypercholesteremia   . Melanoma   . Radiation 08/03/14-08/23/14    35 gray to right chest  . Lung cancer   . FH: chemotherapy     Past Surgical History  Procedure Laterality Date  . Video bronchoscopy Bilateral 07/20/2014    Procedure: VIDEO BRONCHOSCOPY WITHOUT FLUORO;  Surgeon: Tanda Rockers, MD;  Location: [facility];  Service: Cardiopulmonary;  Laterality: Bilateral;    Family History  Problem Relation Age of Onset  . Heart disease Mother   . Cancer Mother     ? type    Social History:  reports that he quit smoking about 5 months ago. His smoking use included Cigarettes and Cigars. He has a 100 pack-year smoking history. His smokeless tobacco use includes Chew. He reports that he does not drink alcohol or use illicit drugs.  Allergies: No Known Allergies  Prior to Admission medications   Medication Sig Start Date End Date Taking? Authorizing Provider  atorvastatin (LIPITOR) 80 MG tablet Take 80 mg by mouth daily.   Yes Historical Provider, MD  budesonide-formoterol (SYMBICORT) 160-4.5 MCG/ACT inhaler Take 2 puffs first thing in am and then another 2 puffs about 12 hours later. Patient taking differently:  Inhale 2 puffs into the lungs 2 (two) times daily as needed.  10/17/14  Yes Tanda Rockers, MD  Cholecalciferol (VITAMIN D3 PO) Take 10,000 Units by mouth daily.   Yes Historical Provider, MD  Cinnamon 500 MG capsule Take 500 mg by mouth daily.   Yes Historical Provider, MD  Dapagliflozin Propanediol (FARXIGA) 5 MG TABS Take 5 mg by mouth 2 (two) times daily.   Yes Historical Provider, MD  glimepiride (AMARYL) 4 MG tablet Take 4 mg by mouth 2 (two) times daily. Take before meals   Yes Historical Provider, MD  HYDROcodone-acetaminophen (NORCO) 5-325 MG per tablet Take 1 tablet by mouth every 6 (six) hours as needed for moderate pain. 11/21/14  Yes Carlton Adam, PA-C  LORazepam (ATIVAN) 0.5 MG tablet Take 1 table by mouth every 8 to 12 hours as needed for anxiety 11/08/14  Yes Adrena E Johnson, PA-C  metFORMIN (GLUCOPHAGE) 500 MG tablet Take 500 mg by mouth 2 (two) times daily with a meal.   Yes Historical Provider, MD  methylPREDNIsolone (MEDROL DOSPACK) 4 MG tablet follow package directions 11/21/14  Yes Adrena E Johnson, PA-C  omeprazole (PRILOSEC) 20 MG capsule Take 20 mg by mouth daily.   Yes Historical Provider, MD  potassium chloride SA (K-DUR,KLOR-CON) 20 MEQ tablet Take 1 tablet (20 mEq total) by mouth daily. 10/28/14  Yes Garald Balding, NP  PRESCRIPTION MEDICATION once a week.   Yes Historical Provider, MD  prochlorperazine (COMPAZINE) 10 MG tablet Take 1 tablet (10 mg total) by mouth every 6 (six) hours as needed for nausea or vomiting. 10/25/14  Yes Drue Second, NP  temazepam (RESTORIL) 15 MG capsule Take 1 capsule (15 mg total) by mouth at bedtime as needed for sleep. 11/01/14  Yes Curt Bears, MD  potassium chloride (K-DUR) 10 MEQ tablet Take 2 tablets (20 mEq total) by mouth 2 (two) times daily. 10/23/14 10/26/14  Barton Dubois, MD  potassium chloride SA (K-DUR,KLOR-CON) 20 MEQ tablet Take 1 tablet (20 mEq total) by mouth daily. Patient not taking: Reported on 11/21/2014 10/28/14    Garald Balding, NP     Results for orders placed or performed during the hospital encounter of 11/29/14 (from the past 48 hour(s))  CBC with Differential     Status: Abnormal   Collection Time: 11/29/14  6:44 AM  Result Value Ref Range   WBC 4.7 4.0 - 10.5 K/uL   RBC 1.88 (L) 4.22 - 5.81 MIL/uL   Hemoglobin 5.9 (LL) 13.0 - 17.0 g/dL    Comment: REPEATED TO VERIFY CRITICAL RESULT CALLED TO, READ BACK BY AND VERIFIED WITH: T.NEILSON RN AT 0705 ON 12.16.15 BY SHUEA    HCT 17.4 (L) 39.0 - 52.0 %   MCV 92.6 78.0 - 100.0 fL   MCH 31.4 26.0 - 34.0 pg   MCHC 33.9 30.0 - 36.0 g/dL   RDW 18.8 (H) 11.5 - 15.5 %   Platelets 200 150 - 400 K/uL   Neutrophils Relative % 82 (H) 43 - 77 %   Neutro Abs 3.8 1.7 - 7.7 K/uL   Lymphocytes Relative 9 (L) 12 - 46 %   Lymphs Abs 0.4 (L) 0.7 - 4.0 K/uL   Monocytes Relative 8 3 - 12 %   Monocytes Absolute 0.4 0.1 - 1.0 K/uL   Eosinophils Relative 0 0 - 5 %   Eosinophils Absolute 0.0 0.0 - 0.7 K/uL   Basophils Relative 0 0 - 1 %   Basophils Absolute 0.0 0.0 - 0.1 K/uL  Comprehensive metabolic panel     Status: Abnormal   Collection Time: 11/29/14  6:44 AM  Result Value Ref Range   Sodium 134 (L) 137 - 147 mEq/L   Potassium 3.3 (L) 3.7 - 5.3 mEq/L   Chloride 97 96 - 112 mEq/L   CO2 24 19 - 32 mEq/L   Glucose, Bld 250 (H) 70 - 99 mg/dL   BUN 30 (H) 6 - 23 mg/dL   Creatinine, Ser 0.90 0.50 - 1.35 mg/dL   Calcium 8.7 8.4 - 10.5 mg/dL   Total Protein 5.6 (L) 6.0 - 8.3 g/dL   Albumin 2.9 (L) 3.5 - 5.2 g/dL   AST 10 0 - 37 U/L   ALT 11 0 - 53 U/L   Alkaline Phosphatase 58 39 - 117 U/L   Total Bilirubin 0.6 0.3 - 1.2 mg/dL   GFR calc non Af Amer 86 (L) >90 mL/min   GFR calc Af Amer >90 >90 mL/min    Comment: (NOTE) The eGFR has been calculated using the CKD EPI equation. This calculation has not been validated in all clinical situations. eGFR's persistently <90 mL/min signify possible Chronic Kidney Disease.    Anion gap 13 5 - 15  Lipase, blood      Status: None   Collection Time: 11/29/14  6:44 AM  Result Value Ref Range   Lipase 41 11 - 59 U/L  APTT     Status: None  Collection Time: 11/29/14  6:44 AM  Result Value Ref Range   aPTT 24 24 - 37 seconds  Protime-INR     Status: Abnormal   Collection Time: 11/29/14  6:44 AM  Result Value Ref Range   Prothrombin Time 15.5 (H) 11.6 - 15.2 seconds   INR 1.21 0.00 - 1.49  Type and screen     Status: None (Preliminary result)   Collection Time: 11/29/14  6:47 AM  Result Value Ref Range   ABO/RH(D) B POS    Antibody Screen NEG    Sample Expiration 12/02/2014    Unit Number [id number]    Blood Component Type RED CELLS,LR    Unit division 00    Status of Unit ISSUED,FINAL    Transfusion Status OK TO TRANSFUSE    Crossmatch Result Compatible    Unit Number [id number]    Blood Component Type RED CELLS,LR    Unit division 00    Status of Unit ISSUED,FINAL    Transfusion Status OK TO TRANSFUSE    Crossmatch Result Compatible    Unit Number [id number]    Blood Component Type RED CELLS,LR    Unit division 00    Status of Unit ISSUED    Transfusion Status OK TO TRANSFUSE    Crossmatch Result Compatible    Unit Number [id number]    Blood Component Type RED CELLS,LR    Unit division 00    Status of Unit ISSUED    Transfusion Status OK TO TRANSFUSE    Crossmatch Result Compatible    Unit Number [id number]    Blood Component Type RED CELLS,LR    Unit division 00    Status of Unit ISSUED    Transfusion Status OK TO TRANSFUSE    Crossmatch Result Compatible    Unit Number [id number]    Blood Component Type RED CELLS,LR    Unit division 00    Status of Unit ISSUED    Transfusion Status OK TO TRANSFUSE    Crossmatch Result Compatible    Unit Number [id number]    Blood Component Type RED CELLS,LR    Unit division 00    Status of Unit ALLOCATED    Transfusion Status OK TO TRANSFUSE    Crossmatch Result Compatible    Unit Number [id number]    Blood  Component Type RED CELLS,LR    Unit division 00    Status of Unit ALLOCATED    Transfusion Status OK TO TRANSFUSE    Crossmatch Result Compatible   ABO/Rh     Status: None   Collection Time: 11/29/14  6:57 AM  Result Value Ref Range   ABO/RH(D) B POS   I-Stat Chem 8, ED     Status: Abnormal   Collection Time: 11/29/14  7:13 AM  Result Value Ref Range   Sodium 136 (L) 137 - 147 mEq/L   Potassium 3.1 (L) 3.7 - 5.3 mEq/L   Chloride 102 96 - 112 mEq/L   BUN 26 (H) 6 - 23 mg/dL   Creatinine, Ser 0.80 0.50 - 1.35 mg/dL   Glucose, Bld 232 (H) 70 - 99 mg/dL   Calcium, Ion 1.05 (L) 1.13 - 1.30 mmol/L   TCO2 18 0 - 100 mmol/L   Hemoglobin 6.1 (LL) 13.0 - 17.0 g/dL   HCT 18.0 (L) 39.0 - 52.0 %   Comment NOTIFIED PHYSICIAN   POC occult blood, ED     Status: Abnormal   Collection Time: 11/29/14  7:24 AM  Result Value Ref Range  Fecal Occult Bld POSITIVE (A) NEGATIVE  Prepare RBC     Status: None   Collection Time: 11/29/14  7:30 AM  Result Value Ref Range   Order Confirmation ORDER PROCESSED BY BLOOD BANK   MRSA PCR Screening     Status: None   Collection Time: 11/29/14  9:03 AM  Result Value Ref Range   MRSA by PCR NEGATIVE NEGATIVE    Comment:        The GeneXpert MRSA Assay (FDA approved for NASAL specimens only), is one component of a comprehensive MRSA colonization surveillance program. It is not intended to diagnose MRSA infection nor to guide or monitor treatment for MRSA infections.   Glucose, capillary     Status: Abnormal   Collection Time: 11/29/14  1:07 PM  Result Value Ref Range   Glucose-Capillary 159 (H) 70 - 99 mg/dL   Comment 1 Documented in Chart    Comment 2 Notify RN   Hepatic function panel     Status: Abnormal   Collection Time: 11/29/14  3:54 PM  Result Value Ref Range   Total Protein 4.9 (L) 6.0 - 8.3 g/dL   Albumin 2.6 (L) 3.5 - 5.2 g/dL   AST 10 0 - 37 U/L   ALT 11 0 - 53 U/L   Alkaline Phosphatase 48 39 - 117 U/L   Total Bilirubin 1.6 (H) 0.3  - 1.2 mg/dL   Bilirubin, Direct 0.3 0.0 - 0.3 mg/dL   Indirect Bilirubin 1.3 (H) 0.3 - 0.9 mg/dL  Magnesium     Status: None   Collection Time: 11/29/14  3:54 PM  Result Value Ref Range   Magnesium 1.8 1.5 - 2.5 mg/dL  Troponin I     Status: None   Collection Time: 11/29/14  3:54 PM  Result Value Ref Range   Troponin I <0.30 <0.30 ng/mL    Comment:        Due to the release kinetics of cTnI, a negative result within the first hours of the onset of symptoms does not rule out myocardial infarction with certainty. If myocardial infarction is still suspected, repeat the test at appropriate intervals.   Hemoglobin A1c     Status: Abnormal   Collection Time: 11/29/14  3:54 PM  Result Value Ref Range   Hgb A1c MFr Bld 6.1 (H) <5.7 %    Comment: (NOTE)                                                                       According to the ADA Clinical Practice Recommendations for 2011, when HbA1c is used as a screening test:  >=6.5%   Diagnostic of Diabetes Mellitus           (if abnormal result is confirmed) 5.7-6.4%   Increased risk of developing Diabetes Mellitus References:Diagnosis and Classification of Diabetes Mellitus,Diabetes JOAC,1660,63(KZSWF 1):S62-S69 and Standards of Medical Care in         Diabetes - 2011,Diabetes UXNA,3557,32 (Suppl 1):S11-S61.    Mean Plasma Glucose 128 (H) <117 mg/dL    Comment: Performed at [facility]  CBC     Status: Abnormal   Collection Time: 11/29/14  3:56 PM  Result Value Ref Range   WBC 3.6 (L) 4.0 -  10.5 K/uL   RBC 2.52 (L) 4.22 - 5.81 MIL/uL   Hemoglobin 7.7 (L) 13.0 - 17.0 g/dL    Comment: DELTA CHECK NOTED POST TRANSFUSION SPECIMEN    HCT 22.5 (L) 39.0 - 52.0 %   MCV 89.3 78.0 - 100.0 fL   MCH 30.6 26.0 - 34.0 pg   MCHC 34.2 30.0 - 36.0 g/dL   RDW 18.2 (H) 11.5 - 15.5 %   Platelets 143 (L) 150 - 400 K/uL    Comment: DELTA CHECK NOTED POST TRANSFUSION SPECIMEN SPECIMEN CHECKED FOR CLOTS   TSH     Status: None    Collection Time: 11/29/14  3:57 PM  Result Value Ref Range   TSH 0.951 0.350 - 4.500 uIU/mL    Comment: Performed at [facility]  Glucose, capillary     Status: Abnormal   Collection Time: 11/29/14  4:14 PM  Result Value Ref Range   Glucose-Capillary 134 (H) 70 - 99 mg/dL   Comment 1 Notify RN    Comment 2 Documented in Chart   Troponin I     Status: None   Collection Time: 11/29/14 10:11 PM  Result Value Ref Range   Troponin I <0.30 <0.30 ng/mL    Comment:        Due to the release kinetics of cTnI, a negative result within the first hours of the onset of symptoms does not rule out myocardial infarction with certainty. If myocardial infarction is still suspected, repeat the test at appropriate intervals.   Glucose, capillary     Status: Abnormal   Collection Time: 11/30/14 12:13 AM  Result Value Ref Range   Glucose-Capillary 122 (H) 70 - 99 mg/dL  Comprehensive metabolic panel     Status: Abnormal   Collection Time: 11/30/14  3:26 AM  Result Value Ref Range   Sodium 135 (L) 137 - 147 mEq/L   Potassium 3.7 3.7 - 5.3 mEq/L   Chloride 105 96 - 112 mEq/L   CO2 18 (L) 19 - 32 mEq/L   Glucose, Bld 167 (H) 70 - 99 mg/dL   BUN 26 (H) 6 - 23 mg/dL   Creatinine, Ser 0.87 0.50 - 1.35 mg/dL   Calcium 7.2 (L) 8.4 - 10.5 mg/dL   Total Protein 4.1 (L) 6.0 - 8.3 g/dL   Albumin 2.1 (L) 3.5 - 5.2 g/dL   AST 10 0 - 37 U/L   ALT 9 0 - 53 U/L   Alkaline Phosphatase 41 39 - 117 U/L   Total Bilirubin 0.8 0.3 - 1.2 mg/dL   GFR calc non Af Amer 87 (L) >90 mL/min   GFR calc Af Amer >90 >90 mL/min    Comment: (NOTE) The eGFR has been calculated using the CKD EPI equation. This calculation has not been validated in all clinical situations. eGFR's persistently <90 mL/min signify possible Chronic Kidney Disease.    Anion gap 12 5 - 15  CBC     Status: Abnormal   Collection Time: 11/30/14  3:26 AM  Result Value Ref Range   WBC 3.1 (L) 4.0 - 10.5 K/uL   RBC 1.87 (L) 4.22 - 5.81  MIL/uL   Hemoglobin 5.7 (LL) 13.0 - 17.0 g/dL    Comment: DELTA CHECK NOTED REPEATED TO VERIFY CRITICAL RESULT CALLED TO, READ BACK BY AND VERIFIED WITH: GRAHAM,T RN [phone] [id number] COVINGTON,N    HCT 16.8 (L) 39.0 - 52.0 %   MCV 89.8 78.0 - 100.0 fL   MCH 30.5 26.0 - 34.0  pg   MCHC 33.9 30.0 - 36.0 g/dL   RDW 19.2 (H) 11.5 - 15.5 %   Platelets 127 (L) 150 - 400 K/uL  Prepare RBC     Status: None   Collection Time: 11/30/14  5:00 AM  Result Value Ref Range   Order Confirmation ORDER PROCESSED BY BLOOD BANK   Glucose, capillary     Status: Abnormal   Collection Time: 11/30/14  5:53 AM  Result Value Ref Range   Glucose-Capillary 162 (H) 70 - 99 mg/dL  CBC with Differential     Status: Abnormal   Collection Time: 11/30/14 11:45 AM  Result Value Ref Range   WBC 2.6 (L) 4.0 - 10.5 K/uL   RBC 2.61 (L) 4.22 - 5.81 MIL/uL   Hemoglobin 7.5 (L) 13.0 - 17.0 g/dL    Comment: REPEATED TO VERIFY DELTA CHECK NOTED POST TRANSFUSION SPECIMEN    HCT 22.9 (L) 39.0 - 52.0 %   MCV 87.7 78.0 - 100.0 fL   MCH 28.7 26.0 - 34.0 pg   MCHC 32.8 30.0 - 36.0 g/dL   RDW 18.4 (H) 11.5 - 15.5 %   Platelets 80 (L) 150 - 400 K/uL    Comment: REPEATED TO VERIFY DELTA CHECK NOTED SPECIMEN CHECKED FOR CLOTS PLATELET COUNT CONFIRMED BY SMEAR POST TRANSFUSION SPECIMEN    Neutrophils Relative % 66 43 - 77 %   Lymphocytes Relative 26 12 - 46 %   Monocytes Relative 8 3 - 12 %   Eosinophils Relative 0 0 - 5 %   Basophils Relative 0 0 - 1 %   Neutro Abs 1.7 1.7 - 7.7 K/uL   Lymphs Abs 0.7 0.7 - 4.0 K/uL   Monocytes Absolute 0.2 0.1 - 1.0 K/uL   Eosinophils Absolute 0.0 0.0 - 0.7 K/uL   Basophils Absolute 0.0 0.0 - 0.1 K/uL   RBC Morphology POLYCHROMASIA PRESENT     Comment: RARE NRBCs   WBC Morphology MILD LEFT SHIFT (1-5% METAS, OCC MYELO, OCC BANDS)     No results found.  Review of Systems  Unable to perform ROS: intubated   Blood pressure 93/52, pulse 102, temperature 98 F (36.7 C), temperature  source Oral, resp. rate 22, height _0  (1.727 m), weight 155 lb (70.308 kg), SpO2 96 %. Physical Exam  Constitutional:  Elderly, pale appearing male.  HENT:  Endotracheal tube in place.  GI: Soft. He exhibits no distension and no mass.  Neurological:  Intubated and sedated.    Assessment/Plan: Bleeding duodenal ulcer with hemorrhagic shock. Unable to be controlled with endoscopic methods.  Plan: To the operating room for emergency exploratory laparotomy and oversewing of bleeding duodenal ulcer. I spoke with his wife about the procedure, rationale, and risks. The risks include but are not limited to bleeding, infection, wound healing problems, anesthesia, need for recurrent surgery, injury to intra-abdominal organs, death.  I spoke with her about the fact that given the emergency nature of the operation as well as the fact he is on chemotherapy his risks were increased. She seems to understand this and agrees with the plan.  Jett Fukuda J 11/30/2014, 2:02 PM

## 2014-11-30 NOTE — Progress Notes (Signed)
Pt taken to operating room due to uncontrolled bleeding of a duodenal bulb ulcer.

## 2014-11-30 NOTE — Progress Notes (Addendum)
[facility] PROGRESS NOTE  Daryl Vega LNL:[id number] DOB: 09/29/1946 DOA: 11/29/2014 PCP: [name], MD  Assessment/Plan: Active Problems:   Anemia   GI bleed     Hematemesis/melena Hemoglobin has dropped significantly again overnight after receiving 2 units of packed red blood cells yesterday, but patient also receiving chemotherapy. Discussed with oncology most likely the significant hemoglobin drop because of ongoing bleeding Continue proton extra Follow CBC closely Transfusion of 2 units of packed red blood cells today-total of 4 units this admission [facility] do EGD today    Diabetes Hold farxiga, metformin Start the patient on sliding scale insulin  ,Hypovolemic shock Hopefully will improve with blood transfusions, Receiving generous fluid boluses  Dyslipidemia Hold Lipitor  COPD continue Symbicort  Code Status: full Family Communication: family updated about patient's clinical progress Disposition Plan:  As above    Brief narrative: 68 y.o. male with a PMHx of DM2, HLD, melanoma, NSCLC currently undergoing radiation/chemotherapy, who presents to the ED with complaints of lightheadedness , hematemesis, melena, syncopal episode with standing which has been ongoing for ~1week and caused him to fall back into the bathroom wall this morning around 3:30 am. Pt's wife reports that when she came to assist him, he was on the floor of the bathroom. The wife and son had left him on the floor. Reports that he's been feeling weak for some time, since starting chemo. He had 2 episodes of melena over the course of the last 2 days. One episode of bloody hematemesis this morning. They called the oncology office 2 days ago but never heard back. Last chemotherapy was on 11/22/14, and is scheduled for an infusion today. Oncologist is Dr. Aundra Dubin.   Pt denies currently feeling nauseated or any ongoing emesis. Patient does not recall when was her last colonoscopy or  endoscopy. He denies taking any aspirin or NSAIDs at home. Fecal occult blood positive. Denies fever, chills, HA, vision changes, tinnitus, syncope, CP, SOB, abd pain, constipation, obstipation, hematochezia, hematuria, dysuria, paresthesias, numbness, or focal weakness.   Wife reports that since starting chemo he hasn't had an appetite, uses compazine for nausea. Wife also reports that pt looks more pale than normal.  Hemoglobin has been trending down, 10.0 on 12/8, 5.9 today. Platelet count is 200. Blood pressure was soft with systolic in the 40C upon arrival. Patient is emaciated and a proton extra in the ER. Currently receiving 2 units of blood  Consultants:  Gastroenterology  Oncology  Procedures:  EGD 12/17  Antibiotics: None  HPI/Subjective: Apparently the patient still has ongoing melena. Hemoglobin dropped again. Hypertensive this morning  Objective: Filed Vitals:   11/30/14 0800 11/30/14 0830 11/30/14 0842 11/30/14 0900  BP: 90/54 83/59  100/49  Pulse: 105 106  107  Temp:      TempSrc: Oral     Resp: 18 13  22   Height:      Weight:      SpO2: 96% 98% 97% 98%    Intake/Output Summary (Last 24 hours) at 11/30/14 1448 Last data filed at 11/30/14 0800  Gross per 24 hour  Intake 4382.66 ml  Output    500 ml  Net 3882.66 ml    Exam:  General: alert & oriented x 3 In NAD  Cardiovascular: RRR, nl S1 s2  Respiratory: Decreased breath sounds at the bases, scattered rhonchi, no crackles  Abdomen: soft +BS NT/ND, no masses palpable  Extremities: No cyanosis and no edema      Data Reviewed: Basic Metabolic  Panel:  Recent Labs Lab 11/29/14 0644 11/29/14 0713 11/29/14 1554 11/30/14 0326  NA 134* 136*  --  135*  K 3.3* 3.1*  --  3.7  CL 97 102  --  105  CO2 24  --   --  18*  GLUCOSE 250* 232*  --  167*  BUN 30* 26*  --  26*  CREATININE 0.90 0.80  --  0.87  CALCIUM 8.7  --   --  7.2*  MG  --   --  1.8  --     Liver Function Tests:  Recent  Labs Lab 11/29/14 0644 11/29/14 1554 11/30/14 0326  AST 10 10 10   ALT 11 11 9   ALKPHOS 58 48 41  BILITOT 0.6 1.6* 0.8  PROT 5.6* 4.9* 4.1*  ALBUMIN 2.9* 2.6* 2.1*    Recent Labs Lab 11/29/14 0644  LIPASE 41   No results for input(s): AMMONIA in the last 168 hours.  CBC:  Recent Labs Lab 11/29/14 0644 11/29/14 0713 11/29/14 1556 11/30/14 0326  WBC 4.7  --  3.6* 3.1*  NEUTROABS 3.8  --   --   --   HGB 5.9* 6.1* 7.7* 5.7*  HCT 17.4* 18.0* 22.5* 16.8*  MCV 92.6  --  89.3 89.8  PLT 200  --  143* 127*    Cardiac Enzymes:  Recent Labs Lab 11/29/14 1554 11/29/14 2211  TROPONINI <0.30 <0.30   BNP (last 3 results) No results for input(s): PROBNP in the last 8760 hours.   CBG:  Recent Labs Lab 11/29/14 1307 11/29/14 1614 11/30/14 0013 11/30/14 0553  GLUCAP 159* 134* 122* 162*    Recent Results (from the past 240 hour(s))  MRSA PCR Screening     Status: None   Collection Time: 11/29/14  9:03 AM  Result Value Ref Range Status   MRSA by PCR NEGATIVE NEGATIVE Final    Comment:        The GeneXpert MRSA Assay (FDA approved for NASAL specimens only), is one component of a comprehensive MRSA colonization surveillance program. It is not intended to diagnose MRSA infection nor to guide or monitor treatment for MRSA infections.      Studies: No results found.  Scheduled Meds: . antiseptic oral rinse  7 mL Mouth Rinse q12n4p  . budesonide-formoterol  2 puff Inhalation BID  . chlorhexidine  15 mL Mouth Rinse BID  . insulin aspart  0-9 Units Subcutaneous QID  . [START ON 12/02/2014] pantoprazole (PROTONIX) IV  40 mg Intravenous Q12H  . sodium chloride  3 mL Intravenous Q12H   Continuous Infusions: . pantoprozole (PROTONIX) infusion 8 mg/hr (11/30/14 0600)  . 0.9 % sodium chloride with kcl 100 mL/hr at 11/30/14 1610    Active Problems:   Anemia   GI bleed     Time spent: 40 minutes   [facility] Pager [phone]. If  7PM-7AM, please contact night-coverage at [email or web address], password [facility] 11/30/2014, 9:26 AM  LOS: 1 day

## 2014-11-30 NOTE — Progress Notes (Signed)
INITIAL NUTRITION ASSESSMENT  DOCUMENTATION CODES Per approved criteria  -Severe malnutrition in the context of chronic illness  Pt meets criteria for severe MALNUTRITION in the context of chronic illness as evidenced by PO intake < 75% for > one month, 25% body weight loss in < one year   INTERVENTION: -Recommend Boost Plus Q24H with diet advancement, each supplement provides 360 kcal, 14 gram protein -Recommend snacks BID -Recommend anti-emetics and use of oral rinses to assist with taste -RD to continue to monitor  NUTRITION DIAGNOSIS: Inadequate oral intake related to nausea/taste changes as evidenced by PO intake < 75%, 50 lb weight loss.   Goal: Pt to meet >/= 90% of their estimated nutrition needs    Monitor: Diet order, total protein/energy intake, labs, weight, supplement/snack acceptance  Reason for Assessment: MST  68 y.o. male  Admitting Dx: <principal problem not specified>  ASSESSMENT: 68 y.o. male with a PMHx of DM2, HLD, melanoma, NSCLC currently undergoing radiation/chemotherapy, who presents to the ED with complaints of lightheadedness , hematemesis, melena, syncopal episode with standing which has been ongoing for ~1week and caused him to fall back into the bathroom wall this morning around 3:30 am.  -Pt reported an unintentional wt loss of ~50 lbs since beginning of chemo and radiation treatments, approximately 8 months ago (25% body weight loss, severe for time frame) -Endorsed ongoing nausea and taste changes that have inhibited appetite. Pt consumes one Boost daily, and minimal intake of meals or snacks. Enjoys sweets and snacks vs meals -RD encouraged use of anti-emetics, as well as implementing use of oral rinses to assist with taste changes -Has been evaluated by outpatient oncology RD in 08/2014 for persistent nausea and vomiting. Was educated and provided nutrition handouts/resources. Has experienced ~10 lb weight loss since outpatient appointment. -Pt  NPO for EGD d/t possible GI bleed. -RD to order Boost once daily + snacks w/diet advancement. Pt willing to consume MagicCup and pudding inbetween meals  Height: Ht Readings from Last 1 Encounters:  11/29/14 5\' 8"  (1.727 m)    Weight: Wt Readings from Last 1 Encounters:  11/29/14 155 lb (70.308 kg)    Ideal Body Weight: 154 lb  % Ideal Body Weight: 100%  Wt Readings from Last 10 Encounters:  11/29/14 155 lb (70.308 kg)  11/21/14 154 lb 1.6 oz (69.899 kg)  11/08/14 155 lb 11.2 oz (70.625 kg)  10/22/14 156 lb 8 oz (70.988 kg)  10/11/14 170 lb 14.4 oz (77.52 kg)  09/25/14 162 lb 6.4 oz (73.664 kg)  09/19/14 165 lb (74.844 kg)  09/06/14 165 lb 3.2 oz (74.934 kg)  08/23/14 165 lb (74.844 kg)  08/17/14 165 lb 4.8 oz (74.98 kg)    Usual Body Weight: 200-210 lb  % Usual Body Weight: 75%  BMI:  Body mass index is 23.57 kg/(m^2).  Estimated Nutritional Needs: Kcal: 1900-2100 Protein: 85-100 gram Fluid: >/=2100 ml daily  Skin: WDL  Diet Order: Diet NPO time specified  EDUCATION NEEDS: -Education needs addressed   Intake/Output Summary (Last 24 hours) at 11/30/14 1153 Last data filed at 11/30/14 1050  Gross per 24 hour  Intake 4112.25 ml  Output   1000 ml  Net 3112.25 ml    Last BM: PTA   Labs:   Recent Labs Lab 11/29/14 0644 11/29/14 0713 11/29/14 1554 11/30/14 0326  NA 134* 136*  --  135*  K 3.3* 3.1*  --  3.7  CL 97 102  --  105  CO2 24  --   --  18*  BUN 30* 26*  --  26*  CREATININE 0.90 0.80  --  0.87  CALCIUM 8.7  --   --  7.2*  MG  --   --  1.8  --   GLUCOSE 250* 232*  --  167*    CBG (last 3)   Recent Labs  11/29/14 1614 11/30/14 0013 11/30/14 0553  GLUCAP 134* 122* 162*    Scheduled Meds: . [facility] Hold] antiseptic oral rinse  7 mL Mouth Rinse q12n4p  . [MAR Hold] budesonide-formoterol  2 puff Inhalation BID  . [MAR Hold] chlorhexidine  15 mL Mouth Rinse BID  . [MAR Hold] insulin aspart  0-9 Units Subcutaneous QID  . [MAR Hold]  pantoprazole (PROTONIX) IV  40 mg Intravenous Q12H  . [MAR Hold] sodium chloride  3 mL Intravenous Q12H    Continuous Infusions: . pantoprozole (PROTONIX) infusion 8 mg/hr (11/30/14 0600)  . 0.9 % sodium chloride with kcl 100 mL/hr at 11/30/14 7124    Past Medical History  Diagnosis Date  . DM (diabetes mellitus)   . Pneumonia   . Hypercholesteremia   . Melanoma   . Radiation 08/03/14-08/23/14    35 gray to right chest  . Lung cancer   . FH: chemotherapy     Past Surgical History  Procedure Laterality Date  . Video bronchoscopy Bilateral 07/20/2014    Procedure: VIDEO BRONCHOSCOPY WITHOUT FLUORO;  Surgeon: Tanda Rockers, MD;  Location: [facility] ENDOSCOPY;  Service: Cardiopulmonary;  Laterality: Bilateral;    [place] LDN Clinical Dietitian [phone]

## 2014-11-30 NOTE — Progress Notes (Signed)
eLink Physician-Brief Progress Note Patient Name: Daryl Vega DOB: [birth year] MRN: [id number]   Date of Service  11/30/2014  HPI/Events of Note  Pulling at tubes lines on vent despite fentanyl gtt Metabolic acidosis  eICU Interventions  Check lactic acid Repeat ABG in AM Add prn versed     Intervention Category Minor Interventions: Agitation / anxiety - evaluation and management  MCQUAID, DOUGLAS 11/30/2014, 7:22 PM

## 2014-11-30 NOTE — Op Note (Signed)
[facility] [place], [address]   ENDOSCOPY PROCEDURE REPORT  PATIENT: Daryl Vega, Daryl Vega  MR#: [id number] BIRTHDATE: January 28, 1946 , 70  yrs. old GENDER: male ENDOSCOPIST: [name], MD PROCEDURE DATE:  11/30/2014 PROCEDURE:  EGD w/ control of bleeding ASA CLASS:     Class III INDICATIONS:  melena, hematochezia, significant anemia. MEDICATIONS: Per Anesthesia TOPICAL ANESTHETIC: none  DESCRIPTION OF PROCEDURE: After the risks benefits and alternatives of the procedure were thoroughly explained, informed consent was obtained.  The Pentax Gastroscope [id number] endoscope was introduced through the mouth and advanced to the second portion of the duodenum , Without limitations.  The instrument was slowly withdrawn as the mucosa was fully examined.  There were several small, clean based ulcers in the distal stomach among pan-gastritis.  The distal stomach was biopsied and sent to pathology.  There was a large duodenal bulb ulcer with a very clear, medium to large sized non-bleeding visible vessel.  The surrounding mucosa around the ulcer was inflammed, heaped up.  I injected dilute epinephrine in several locations around the visible vessle.  7Fr Gold Probe was then used to attempt to destroy the vessel.  Cautery resulted in signficant, pulsatile bleeding from the vessel.  Visibility was nearly completely lost.  I attempted to place endoclip at the site, rather blindly.  This did not slow the bleeding.  I determined further endoscopic therapy to be futile and called general surgeon Jackolyn Confer to evaluated the patient. He was in endoscopy within 5 minutes and we agreed he needed exploratory laparotomy.  Retroflexed views revealed no abnormalities.     The scope was then withdrawn from the patient and the procedure completed.  COMPLICATIONS: There were no immediate complications.  ENDOSCOPIC IMPRESSION: There were several small, clean based  ulcers in the distal stomach among pan-gastritis.  The distal stomach was biopsied and sent to pathology.  There was a large duodenal bulb ulcer with a very clear, medium to large sized non-bleeding visible vessel.  The surrounding mucosa around the ulcer was inflammed, heaped up.  I injected dilute epinephrine in several locations around the visible vessle.  7Fr Gold Probe was then used to attempt to destroy the vessel.  Cautery resulted in signficant, pulsatile bleeding from the vessel.  Visibility was nearly completely lost.  I attempted to place endoclip at the site, rather blindly.  This did not slow the bleeding.  I determined further endoscopic therapy to be futile and called general surgeon Jackolyn Confer to evaluated the patient. He was in endoscopy within 5 minutes and we agreed he needed exploratory laparotomy  RECOMMENDATIONS: To the operating room for exploratory laparotomy for likely arterial bleeding at site of duodenal bulb ulcer.   eSigned:  [name], MD 11/30/2014 2:01 PM     PATIENT NAME:  Daryl Vega, Daryl Vega MR#: [id number]

## 2014-11-30 NOTE — Consult Note (Signed)
PULMONARY / CRITICAL CARE MEDICINE   Name: Daryl Vega MRN: [id number] DOB: 03/28/1946    ADMISSION DATE:  11/29/2014 CONSULTATION DATE:  11/29/2014  REFERRING MD :  Zella Richer  CHIEF COMPLAINT:  Post op vent management  INITIAL PRESENTATION: 68 y/o male admitted on 12/16 with a GI bleed.  He went for an endoscopy on 12/17 and was found to have a bleeding duodenal ulcer which rapidly progressed and he was taken to the OR on 12/17 emergently.  There he had an emergency exploratory laparotomy, oversewing of a posterior bleeding duodenal ulcer, pyloroplasty, cholecystectomy, repair of R hepatic artery, repair of cholecystoduodenal fistula and placement of a duodenostomy tube and feeding J-tube.  PCCM consulted for post op ventilator management.  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  has past surgical history that includes Video bronchoscopy (Bilateral, 07/20/2014). Prior to Admission medications   Medication Sig Start Date End Date Taking? Authorizing Provider  atorvastatin (LIPITOR) 80 MG tablet Take 80 mg by mouth daily.   Yes Historical Provider, MD  budesonide-formoterol (SYMBICORT) 160-4.5 MCG/ACT inhaler Take 2 puffs first thing in am and then another 2 puffs about 12 hours later. Patient taking differently: Inhale 2 puffs into the lungs 2 (two) times daily as needed.  10/17/14  Yes Tanda Rockers, MD  Cholecalciferol (VITAMIN D3 PO) Take 10,000 Units by mouth daily.   Yes Historical Provider, MD  Cinnamon 500 MG capsule Take 500 mg by mouth daily.   Yes Historical Provider, MD  Dapagliflozin Propanediol (FARXIGA) 5 MG TABS Take 5 mg by mouth 2 (two) times daily.   Yes Historical Provider, MD  glimepiride (AMARYL) 4 MG tablet Take 4 mg by mouth 2 (two) times daily. Take before meals   Yes Historical Provider, MD  HYDROcodone-acetaminophen (NORCO) 5-325 MG per tablet Take 1 tablet by mouth every 6 (six) hours as needed for moderate pain. 11/21/14  Yes Carlton Adam, PA-C  LORazepam (ATIVAN) 0.5 MG tablet Take 1 table by mouth every 8 to 12 hours as needed for anxiety 11/08/14  Yes Adrena E Johnson, PA-C  metFORMIN (GLUCOPHAGE) 500 MG tablet Take 500 mg by mouth 2 (two) times daily with a meal.   Yes Historical Provider, MD  methylPREDNIsolone (MEDROL DOSPACK) 4 MG tablet follow package directions 11/21/14  Yes Adrena E Johnson, PA-C  omeprazole (PRILOSEC) 20 MG capsule Take 20 mg by mouth daily.   Yes Historical Provider, MD  potassium chloride SA (K-DUR,KLOR-CON) 20 MEQ tablet Take 1 tablet (20 mEq total) by mouth daily. 10/28/14  Yes Garald Balding, NP  PRESCRIPTION MEDICATION once a week.   Yes Historical Provider, MD  prochlorperazine (COMPAZINE) 10 MG tablet Take 1 tablet (10 mg total) by mouth every 6 (six) hours as needed for nausea or vomiting. 10/25/14  Yes Drue Second, NP  temazepam (RESTORIL) 15 MG capsule Take 1 capsule (15 mg total) by mouth at bedtime as needed for sleep. 11/01/14  Yes Curt Bears, MD  potassium chloride (K-DUR) 10 MEQ tablet Take 2 tablets (20 mEq total) by mouth 2 (two) times daily. 10/23/14 10/26/14  Barton Dubois, MD  potassium chloride SA (K-DUR,KLOR-CON) 20 MEQ tablet Take 1 tablet (20 mEq total) by mouth daily. Patient not taking: Reported on 11/21/2014 10/28/14   Garald Balding, NP   No Known Allergies  FAMILY HISTORY:  has no family status  information on file.  SOCIAL HISTORY:  reports that he quit smoking about 5 months ago. His smoking use included Cigarettes and Cigars. He has a 100 pack-year smoking history. His smokeless tobacco use includes Chew. He reports that he does not drink alcohol or use illicit drugs.  REVIEW OF SYSTEMS:  Cannot obtain due to intubation  SUBJECTIVE:   VITAL SIGNS: Temp:  [97.6 F (36.4 C)-98.3 F (36.8 C)] 98 F (36.7 C) (12/17 1114) Pulse Rate:  [89-122] 102 (12/17 1114) Resp:  [13-45] 22 (12/17 1114) BP: (72-100)/(34-61) 93/52 mmHg (12/17 1114) SpO2:  [95 %-99 %] 96 % (12/17 1114) HEMODYNAMICS:   VENTILATOR SETTINGS: Vent Mode:  [-] PRVC Set Rate:  [14 bmp] 14 bmp Vt Set:  [500 mL] 500 mL PEEP:  [5 cmH20] 5 cmH20 Plateau Pressure:  [13 cmH20] 13 cmH20 INTAKE / OUTPUT:  Intake/Output Summary (Last 24 hours) at 11/30/14 1731 Last data filed at 11/30/14 1623  Gross per 24 hour  Intake 7023.25 ml  Output   4100 ml  Net 2923.25 ml    PHYSICAL EXAMINATION: General:  Pale elderly ill appearing man Neuro:  Sedated, moves UE's spontaneously and tracks on fentanyl gtt HEENT:  ETT in place, pupils small B, no lesions Cardiovascular:  Regular, no M Lungs:  L clear, decreased BS on the R Abdomen:  Soft, dressings intact, no BS Musculoskeletal:  No lesions, no edema Skin:  No rash  LABS:  CBC  Recent Labs Lab 11/29/14 1556 11/30/14 0326 11/30/14 1145 11/30/14 1429  WBC 3.6* 3.1* 2.6*  --   HGB 7.7* 5.7* 7.5* 6.5*  HCT 22.5* 16.8* 22.9* 19.0*  PLT 143* 127* 80*  --    Coag's  Recent Labs Lab 11/29/14 0644  APTT 24  INR 1.21   BMET  Recent Labs Lab 11/29/14 0644 11/29/14 0713 11/30/14 0326 11/30/14 1429  NA 134* 136* 135* 137  K 3.3* 3.1* 3.7 4.4  CL 97 102 105  --   CO2 24  --  18*  --   BUN 30* 26* 26*  --   CREATININE 0.90 0.80 0.87  --   GLUCOSE 250* 232* 167*  --    Electrolytes  Recent Labs Lab 11/29/14 0644 11/29/14 1554 11/30/14 0326   CALCIUM 8.7  --  7.2*  MG  --  1.8  --    Sepsis Markers No results for input(s): LATICACIDVEN, PROCALCITON, O2SATVEN in the last 168 hours. ABG  Recent Labs Lab 11/30/14 1429  PHART 7.195*  PCO2ART 44.7  PO2ART 284.0*   Liver Enzymes  Recent Labs Lab 11/29/14 0644 11/29/14 1554 11/30/14 0326  AST 10 10 10   ALT 11 11 9   ALKPHOS 58 48 41  BILITOT 0.6 1.6* 0.8  ALBUMIN 2.9* 2.6* 2.1*   Cardiac Enzymes  Recent Labs Lab 11/29/14 1554 11/29/14 2211  TROPONINI <0.30 <0.30   Glucose  Recent Labs Lab 11/29/14 1307 11/29/14 1614 11/30/14 0013 11/30/14 0553  GLUCAP 159* 134* 122* 162*    Imaging No results found.   ASSESSMENT / PLAN:  PULMONARY OETT 12/17 >>  A: To remain  intubated post operatively Stage IIIB Squamous Cell Lung cancer dx 07/2014, Rx carboplatin and AUC 08/2014 At risk TRALI P:   Full vent support Discuss extubation timing with surgery > is he to go back to OR? 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   - Inter-disciplinary family meet or Palliative Care meeting due by:  12/07/14.     TODAY'S SUMMARY:  To ICU on MV post-op repair of erosive DU involving GB and biliary tract. Massive GIB. Currently stable on MV, hypertensive.   Independent CC time 40 minutes   Baltazar Apo, MD, PhD 11/30/2014, 6:25 PM [facility] [phone] or if no answer [phone]

## 2014-11-30 NOTE — H&P (View-Only) (Signed)
Consultation  Referring Provider:  [facility]    Primary Care Physician:  [name], MD Primary Gastroenterologist: none        Reason for Consultation:  GI bleed             HPI:   Daryl Vega is a 68 y.o. male with a history of Stage IIIB (T3, N3, M0) non-small cell lung cancer diagnosed August 2015. He is s/p radiation, currently undergoing chemotherapy. He presented to ED this last night with weakness, hematemesis and falls at home (? Syncopal episode).  Hgb around baseline (10) on 11/21/14, it was 5.9 in ED last night. BUN 30.   Patient fell in bathroom last night. After returning to bed he vomited a large amount of dark red blood. He admits to black stools over last few days. No NSAID use. No history of PUD. No abdominal pain. His appetite has been poor resulting in weight loss of 60 pounds since September. No chronic GI complaints other than recent development of constipation after starting pain medication.  No Knott of GI diseases.  Past Medical History  Diagnosis Date  . DM (diabetes mellitus)   . Pneumonia   . Hypercholesteremia   . Melanoma   . Radiation 08/03/14-08/23/14    35 gray to right chest  . Lung cancer   . FH: chemotherapy     Past Surgical History  Procedure Laterality Date  . Video bronchoscopy Bilateral 07/20/2014    Procedure: VIDEO BRONCHOSCOPY WITHOUT FLUORO;  Surgeon: Tanda Rockers, MD;  Location: [facility];  Service: Cardiopulmonary;  Laterality: Bilateral;    Family History  Problem Relation Age of Onset  . Heart disease Mother   . Cancer Mother     ? type     History  Substance Use Topics  . Smoking status: Former Smoker -- 2.00 packs/day for 50 years    Types: Cigarettes, Cigars    Quit date: 06/12/2014  . Smokeless tobacco: Current User    Types: Chew  . Alcohol Use: No    Prior to Admission medications   Medication Sig Start Date End Date Taking? Authorizing Provider  atorvastatin (LIPITOR) 80 MG tablet Take 80 mg  by mouth daily.   Yes Historical Provider, MD  budesonide-formoterol (SYMBICORT) 160-4.5 MCG/ACT inhaler Take 2 puffs first thing in am and then another 2 puffs about 12 hours later. Patient taking differently: Inhale 2 puffs into the lungs 2 (two) times daily as needed.  10/17/14  Yes Tanda Rockers, MD  Cholecalciferol (VITAMIN D3 PO) Take 10,000 Units by mouth daily.   Yes Historical Provider, MD  Cinnamon 500 MG capsule Take 500 mg by mouth daily.   Yes Historical Provider, MD  Dapagliflozin Propanediol (FARXIGA) 5 MG TABS Take 5 mg by mouth 2 (two) times daily.   Yes Historical Provider, MD  glimepiride (AMARYL) 4 MG tablet Take 4 mg by mouth 2 (two) times daily. Take before meals   Yes Historical Provider, MD  HYDROcodone-acetaminophen (NORCO) 5-325 MG per tablet Take 1 tablet by mouth every 6 (six) hours as needed for moderate pain. 11/21/14  Yes Carlton Adam, PA-C  LORazepam (ATIVAN) 0.5 MG tablet Take 1 table by mouth every 8 to 12 hours as needed for anxiety 11/08/14  Yes Adrena E Johnson, PA-C  metFORMIN (GLUCOPHAGE) 500 MG tablet Take 500 mg by mouth 2 (two) times daily with a meal.   Yes Historical Provider, MD  methylPREDNIsolone (MEDROL DOSPACK) 4 MG tablet  follow package directions 11/21/14  Yes Adrena E Johnson, PA-C  omeprazole (PRILOSEC) 20 MG capsule Take 20 mg by mouth daily.   Yes Historical Provider, MD  potassium chloride SA (K-DUR,KLOR-CON) 20 MEQ tablet Take 1 tablet (20 mEq total) by mouth daily. 10/28/14  Yes Garald Balding, NP  PRESCRIPTION MEDICATION once a week.   Yes Historical Provider, MD  prochlorperazine (COMPAZINE) 10 MG tablet Take 1 tablet (10 mg total) by mouth every 6 (six) hours as needed for nausea or vomiting. 10/25/14  Yes Drue Second, NP  temazepam (RESTORIL) 15 MG capsule Take 1 capsule (15 mg total) by mouth at bedtime as needed for sleep. 11/01/14  Yes Curt Bears, MD  potassium chloride (K-DUR) 10 MEQ tablet Take 2 tablets (20 mEq total) by  mouth 2 (two) times daily. 10/23/14 10/26/14  Barton Dubois, MD  potassium chloride SA (K-DUR,KLOR-CON) 20 MEQ tablet Take 1 tablet (20 mEq total) by mouth daily. Patient not taking: Reported on 11/21/2014 10/28/14   Garald Balding, NP    Current Facility-Administered Medications  Medication Dose Route Frequency Provider Last Rate Last Dose  . [START ON 11/30/2014] antiseptic oral rinse (CPC / CETYLPYRIDINIUM CHLORIDE 0.05%) solution 7 mL  7 mL Mouth Rinse q12n4p [name], MD      . budesonide-formoterol (SYMBICORT) 160-4.5 MCG/ACT inhaler 2 puff  2 puff Inhalation BID [name], MD      . chlorhexidine (PERIDEX) 0.12 % solution 15 mL  15 mL Mouth Rinse BID [name], MD      . HYDROcodone-acetaminophen (NORCO/VICODIN) 5-325 MG per tablet 1 tablet  1 tablet Oral Q6H PRN [name], MD      . insulin aspart (novoLOG) injection 0-9 Units  0-9 Units Subcutaneous QID [name], MD      . levalbuterol (XOPENEX) nebulizer solution 0.63 mg  0.63 mg Nebulization Q6H PRN [name], MD      . LORazepam (ATIVAN) tablet 0.5 mg  0.5 mg Oral Q6H PRN [name], MD      . ondansetron (ZOFRAN) tablet 4 mg  4 mg Oral Q6H PRN [name], MD       Or  . ondansetron (ZOFRAN) injection 4 mg  4 mg Intravenous Q6H PRN [name], MD      . oxyCODONE (Oxy IR/ROXICODONE) immediate release tablet 5 mg  5 mg Oral Q4H PRN [name], MD      . pantoprazole (PROTONIX) 80 mg in sodium chloride 0.9 % 250 mL (0.32 mg/mL) infusion  8 mg/hr Intravenous Continuous [name], MD 25 mL/hr at 11/29/14 1000 8 mg/hr at 11/29/14 1000  . [START ON 12/02/2014] pantoprazole (PROTONIX) injection 40 mg  40 mg Intravenous Q12H [name], MD      . prochlorperazine (COMPAZINE) tablet 10 mg  10 mg Oral Q6H PRN [name], MD      . sodium chloride 0.9 % 1,000 mL with potassium chloride 60 mEq infusion   Intravenous Continuous [name], MD 100 mL/hr at 11/29/14 0930    . sodium chloride 0.9 % injection 3 mL   3 mL Intravenous Q12H [name], MD   3 mL at 11/29/14 0953  . temazepam (RESTORIL) capsule 15 mg  15 mg Oral QHS PRN [name], MD        Allergies as of 11/29/2014  . (No Known Allergies)    Review of Systems:    All systems reviewed and negative except where noted in HPI.    Physical Exam:  Vital signs in last 24 hours: Temp:  [98 F (36.7 C)-98.6 F (37 C)] 98.3 F (36.8 C) (12/16 1135) Pulse Rate:  [106-120] 106 (12/16 1135) Resp:  [13-24] 16 (12/16 1135) BP: (78-95)/(56-69) 91/61 mmHg (12/16 1135) SpO2:  [92 %-100 %] 97 % (12/16 1135) Weight:  [155 lb (70.308 kg)] 155 lb (70.308 kg) (12/16 5621) Last BM Date: 11/29/14 General:   Pleasant white male in NAD Head:  Normocephalic and atraumatic. Eyes:   No icterus.   Conjunctiva pale. Ears:  Normal auditory acuity. Neck:  Supple; no masses felt Lungs:  Respirations even and unlabored. RUL rhonchi.  Heart:  Regular rate and rhythm Abdomen:  Soft, nondistended, nontender. Normal bowel sounds. No appreciable masses or hepatomegaly.  Msk:  Symmetrical without gross deformities.  Extremities:  Without edema. Neurologic:  Alert and  oriented x4;  grossly normal neurologically. Skin:  Intact without significant lesions or rashes. Cervical Nodes:  No significant cervical adenopathy. Psych:  Alert and cooperative. Normal affect.  LAB RESULTS:  Recent Labs  11/29/14 0644 11/29/14 0713  WBC 4.7  --   HGB 5.9* 6.1*  HCT 17.4* 18.0*  PLT 200  --    BMET  Recent Labs  11/29/14 0644 11/29/14 0713  NA 134* 136*  K 3.3* 3.1*  CL 97 102  CO2 24  --   GLUCOSE 250* 232*  BUN 30* 26*  CREATININE 0.90 0.80  CALCIUM 8.7  --    LFT  Recent Labs  11/29/14 0644  PROT 5.6*  ALBUMIN 2.9*  AST 10  ALT 11  ALKPHOS 58  BILITOT 0.6   PT/INR  Recent Labs  11/29/14 0644  LABPROT 15.5*  INR 1.21    PREVIOUS ENDOSCOPIES:            none   Impression / Plan:   22. 68 year old male with NSCLC, s/p radiation  and undergoing chemo since September. Patient presents with episode of hematemesis, black stools over last few days (heme +) and profound anemia. Rule out PUD, AVMs, or neoplasm. Radiation injury of esophagus seem unlikely based on location of treatment and absence of esophageal symptoms.  Mallory Weiss tear possible given preceding melena this seems unlikely. Patient will need EGD, probably in am. Continue PPI drip.  2. Anemia of acute blood loss (hgb 5.9).  He is getting second unit of blood now.    Thanks   LOS: 0 days   Tye Savoy  11/29/2014, 11:53 AM    ________________________________________________________________________  Vel[facility] Heckler GI MD note:  I personally examined the patient, reviewed the data and agree with the assessment and plan described above.  He takes a fair amount of NSAID per his report.  Planning on EGD tomorrow morning.  IV PPI for now.   Owens Loffler, MD [facility] Pager [phone]

## 2014-12-01 ENCOUNTER — Encounter (HOSPITAL_COMMUNITY): Payer: Self-pay | Admitting: Gastroenterology

## 2014-12-01 DIAGNOSIS — K823 Fistula of gallbladder: Secondary | ICD-10-CM

## 2014-12-01 DIAGNOSIS — D649 Anemia, unspecified: Secondary | ICD-10-CM

## 2014-12-01 DIAGNOSIS — J9601 Acute respiratory failure with hypoxia: Secondary | ICD-10-CM

## 2014-12-01 LAB — CBC WITH DIFFERENTIAL/PLATELET
BASOS ABS: 0 10*3/uL (ref 0.0–0.1)
Basophils Relative: 0 % (ref 0–1)
EOS ABS: 0 10*3/uL (ref 0.0–0.7)
EOS PCT: 0 % (ref 0–5)
HCT: 25 % — ABNORMAL LOW (ref 39.0–52.0)
Hemoglobin: 8.5 g/dL — ABNORMAL LOW (ref 13.0–17.0)
Lymphocytes Relative: 10 % — ABNORMAL LOW (ref 12–46)
Lymphs Abs: 0.3 10*3/uL — ABNORMAL LOW (ref 0.7–4.0)
MCH: 29.5 pg (ref 26.0–34.0)
MCHC: 34 g/dL (ref 30.0–36.0)
MCV: 86.8 fL (ref 78.0–100.0)
Monocytes Absolute: 0.4 10*3/uL (ref 0.1–1.0)
Monocytes Relative: 11 % (ref 3–12)
NEUTROS PCT: 79 % — AB (ref 43–77)
Neutro Abs: 2.5 10*3/uL (ref 1.7–7.7)
Platelets: 71 10*3/uL — ABNORMAL LOW (ref 150–400)
RBC: 2.88 MIL/uL — AB (ref 4.22–5.81)
RDW: 16.5 % — ABNORMAL HIGH (ref 11.5–15.5)
WBC: 3.2 10*3/uL — ABNORMAL LOW (ref 4.0–10.5)

## 2014-12-01 LAB — BLOOD GAS, ARTERIAL
ACID-BASE DEFICIT: 11 mmol/L — AB (ref 0.0–2.0)
Acid-base deficit: 9.7 mmol/L — ABNORMAL HIGH (ref 0.0–2.0)
BICARBONATE: 14.5 meq/L — AB (ref 20.0–24.0)
Bicarbonate: 14.8 mEq/L — ABNORMAL LOW (ref 20.0–24.0)
Drawn by: 422461
FIO2: 0.4 %
FIO2: 0.3 %
MECHVT: 500 mL
Mode: POSITIVE
O2 Saturation: 99 %
O2 Saturation: 99 %
PATIENT TEMPERATURE: 97.8
PATIENT TEMPERATURE: 98.6
PEEP/CPAP: 5 cmH2O
PEEP/CPAP: 5 cmH2O
PH ART: 7.341 — AB (ref 7.350–7.450)
PO2 ART: 128 mmHg — AB (ref 80.0–100.0)
Pressure support: 5 cmH2O
RATE: 14 resp/min
TCO2: 14 mmol/L (ref 0–100)
TCO2: 13.7 mmol/L (ref 0–100)
pCO2 arterial: 33.5 mmHg — ABNORMAL LOW (ref 35.0–45.0)
pCO2 arterial: 27.6 mmHg — ABNORMAL LOW (ref 35.0–45.0)
pH, Arterial: 7.265 — ABNORMAL LOW (ref 7.350–7.450)
pO2, Arterial: 162 mmHg — ABNORMAL HIGH (ref 80.0–100.0)

## 2014-12-01 LAB — CBC
HCT: 22.5 % — ABNORMAL LOW (ref 39.0–52.0)
HEMATOCRIT: 30.5 % — AB (ref 39.0–52.0)
HEMATOCRIT: 29.1 % — AB (ref 39.0–52.0)
Hemoglobin: 10.4 g/dL — ABNORMAL LOW (ref 13.0–17.0)
Hemoglobin: 9.9 g/dL — ABNORMAL LOW (ref 13.0–17.0)
Hemoglobin: 7.9 g/dL — ABNORMAL LOW (ref 13.0–17.0)
MCH: 29.1 pg (ref 26.0–34.0)
MCH: 29.4 pg (ref 26.0–34.0)
MCH: 30.2 pg (ref 26.0–34.0)
MCHC: 34.1 g/dL (ref 30.0–36.0)
MCHC: 34 g/dL (ref 30.0–36.0)
MCHC: 35.1 g/dL (ref 30.0–36.0)
MCV: 85.4 fL (ref 78.0–100.0)
MCV: 86.4 fL (ref 78.0–100.0)
MCV: 85.9 fL (ref 78.0–100.0)
PLATELETS: 61 10*3/uL — AB (ref 150–400)
Platelets: 76 10*3/uL — ABNORMAL LOW (ref 150–400)
Platelets: 82 10*3/uL — ABNORMAL LOW (ref 150–400)
RBC: 3.57 MIL/uL — AB (ref 4.22–5.81)
RBC: 3.37 MIL/uL — ABNORMAL LOW (ref 4.22–5.81)
RBC: 2.62 MIL/uL — ABNORMAL LOW (ref 4.22–5.81)
RDW: 15.8 % — ABNORMAL HIGH (ref 11.5–15.5)
RDW: 16.3 % — ABNORMAL HIGH (ref 11.5–15.5)
RDW: 17 % — AB (ref 11.5–15.5)
WBC: 4.9 10*3/uL (ref 4.0–10.5)
WBC: 4.3 10*3/uL (ref 4.0–10.5)
WBC: 2.8 10*3/uL — ABNORMAL LOW (ref 4.0–10.5)

## 2014-12-01 LAB — BASIC METABOLIC PANEL
Anion gap: 11 (ref 5–15)
Anion gap: 12 (ref 5–15)
BUN: 25 mg/dL — ABNORMAL HIGH (ref 6–23)
BUN: 20 mg/dL (ref 6–23)
CHLORIDE: 114 meq/L — AB (ref 96–112)
CHLORIDE: 112 meq/L (ref 96–112)
CO2: 15 meq/L — AB (ref 19–32)
CO2: 17 meq/L — AB (ref 19–32)
CREATININE: 0.8 mg/dL (ref 0.50–1.35)
Calcium: 6.9 mg/dL — ABNORMAL LOW (ref 8.4–10.5)
Calcium: 7 mg/dL — ABNORMAL LOW (ref 8.4–10.5)
Creatinine, Ser: 0.93 mg/dL (ref 0.50–1.35)
GFR calc Af Amer: >90 mL/min (ref >90)
GFR calc non Af Amer: 85 mL/min — ABNORMAL LOW (ref >90)
GFR calc non Af Amer: >90 mL/min (ref >90)
GLUCOSE: 199 mg/dL — AB (ref 70–99)
Glucose, Bld: 187 mg/dL — ABNORMAL HIGH (ref 70–99)
POTASSIUM: 4.3 meq/L (ref 3.7–5.3)
Potassium: 3.4 mEq/L — ABNORMAL LOW (ref 3.7–5.3)
Sodium: 140 mEq/L (ref 137–147)
Sodium: 141 mEq/L (ref 137–147)

## 2014-12-01 LAB — GLUCOSE, CAPILLARY
GLUCOSE-CAPILLARY: 128 mg/dL — AB (ref 70–99)
GLUCOSE-CAPILLARY: 174 mg/dL — AB (ref 70–99)
GLUCOSE-CAPILLARY: 190 mg/dL — AB (ref 70–99)
GLUCOSE-CAPILLARY: 185 mg/dL — AB (ref 70–99)
Glucose-Capillary: 154 mg/dL — ABNORMAL HIGH (ref 70–99)
Glucose-Capillary: 164 mg/dL — ABNORMAL HIGH (ref 70–99)
Glucose-Capillary: 202 mg/dL — ABNORMAL HIGH (ref 70–99)

## 2014-12-01 LAB — PREPARE FRESH FROZEN PLASMA
UNIT DIVISION: 0
Unit division: 0

## 2014-12-01 LAB — MAGNESIUM: Magnesium: 1.2 mg/dL — ABNORMAL LOW (ref 1.5–2.5)

## 2014-12-01 LAB — PHOSPHORUS: Phosphorus: 2 mg/dL — ABNORMAL LOW (ref 2.3–4.6)

## 2014-12-01 LAB — CALCIUM, IONIZED: Calcium, Ion: 1.1 mmol/L — ABNORMAL LOW (ref 1.13–1.30)

## 2014-12-01 LAB — LACTIC ACID, PLASMA: Lactic Acid, Venous: 1.3 mmol/L (ref 0.5–2.2)

## 2014-12-01 MED ORDER — GLUCERNA 1.2 CAL PO LIQD
1000.0000 mL | ORAL | Status: DC
  Administered 2014-12-01: 1000 mL
  Filled 2014-12-01: qty 1000

## 2014-12-01 MED ORDER — SODIUM BICARBONATE 8.4 % IV SOLN
INTRAVENOUS | Status: DC
  Administered 2014-12-01 – 2014-12-02 (×2): via INTRAVENOUS
  Filled 2014-12-01 (×4): qty 150

## 2014-12-01 MED ORDER — POTASSIUM CHLORIDE 10 MEQ/50ML IV SOLN
10.0000 meq | INTRAVENOUS | Status: AC
  Administered 2014-12-01 – 2014-12-02 (×4): 10 meq via INTRAVENOUS
  Filled 2014-12-01 (×3): qty 50

## 2014-12-01 MED ORDER — MAGNESIUM SULFATE 2 GM/50ML IV SOLN
2.0000 g | Freq: Once | INTRAVENOUS | Status: AC
  Administered 2014-12-01: 2 g via INTRAVENOUS
  Filled 2014-12-01: qty 50

## 2014-12-01 NOTE — Progress Notes (Signed)
NUTRITION FOLLOW UP  Intervention:   As warranted: Initiate Glucerna 1.2 @ 20 ml/hr via Jtube and increase by 10 ml every 4 hours to goal rate of 55 ml/hr.   30 ml Prostat daily.    Tube feeding regimen provides 1684 kcal (98% of needs), 95 grams of protein, and 1063 ml of H2O.    Nutrition Dx:   Inadequate oral intake related to nausea/taste changes as evidenced by PO intake < 75%, 50 lb weight loss; ongoing, now r/t to NPO status  Goal:   TF to meet >/= 90% of their estimated nutrition needs    Monitor:   TF tolerance, total protein/energy intake, labs, weight, GI profile  Assessment:   12/17:  -Pt reported an unintentional wt loss of ~50 lbs since beginning of chemo and radiation treatments, approximately 8 months ago (25% body weight loss, severe for time frame) -Endorsed ongoing nausea and taste changes that have inhibited appetite. Pt consumes one Boost daily, and minimal intake of meals or snacks. Enjoys sweets and snacks vs meals -RD encouraged use of anti-emetics, as well as implementing use of oral rinses to assist with taste changes -Has been evaluated by outpatient oncology RD in 08/2014 for persistent nausea and vomiting. Was educated and provided nutrition handouts/resources. Has experienced ~10 lb weight loss since outpatient appointment. -Pt NPO for EGD d/t possible GI bleed. -RD to order Boost once daily + snacks w/diet advancement. Pt willing to consume MagicCup and pudding inbetween meals  12/18: -Per MD note, pt with bleeding duodenal bulb ulcer and fistula; duodenostomy tube and Jtube was placed -Glucerna 1.2 at 20 ml/hr infusing at 20 ml/hr per surgery recommendations. Providing 576 kcal, 15 gram protein, 386 ml free water -CBG elevated -Day 1 post op.Patient is currently intubated on ventilator support MV: 10.8 L/min Temp (24hrs), Avg:98 F (36.7 C), Min:97.6 F (36.4 C), Max:98.7 F (37.1 C)    Height: Ht Readings from Last 1 Encounters:  11/29/14  5\' 8"  (1.727 m)    Weight Status:   Wt Readings from Last 1 Encounters:  11/29/14 155 lb (70.308 kg)    Re-estimated needs:  Kcal: 1714 Protein: 85-100 gram Fluid: >/=2100 ml daily  Skin: drains in right and left abd  Diet Order: Diet NPO time specified   Intake/Output Summary (Last 24 hours) at 12/01/14 0904 Last data filed at 12/01/14 0600  Gross per 24 hour  Intake 8731.55 ml  Output   5025 ml  Net 3706.55 ml    Last BM: 12/17   Labs:   Recent Labs Lab 11/29/14 1554 11/30/14 0326 11/30/14 1429 11/30/14 1830 12/01/14 0425  NA  --  135* 137 140 140  K  --  3.7 4.4 4.6 4.3  CL  --  105  --  112 114*  CO2  --  18*  --  14* 15*  BUN  --  26*  --  25* 25*  CREATININE  --  0.87  --  0.75 0.93  CALCIUM  --  7.2*  --  6.6* 6.9*  MG 1.8  --   --   --   --   GLUCOSE  --  167*  --  250* 187*    CBG (last 3)   Recent Labs  11/30/14 2330 12/01/14 0447 12/01/14 0728  GLUCAP 202* 128* 154*    Scheduled Meds: . sodium chloride   Intravenous Once  . antiseptic oral rinse  7 mL Mouth Rinse QID  . budesonide-formoterol  2 puff Inhalation  BID  . chlorhexidine  15 mL Mouth Rinse BID  . chlorhexidine  15 mL Mouth Rinse BID  . fentaNYL  50 mcg Intravenous Once  . insulin aspart  0-15 Units Subcutaneous 6 times per day  . pantoprazole (PROTONIX) IV  40 mg Intravenous Q12H  . sodium chloride  3 mL Intravenous Q12H    Continuous Infusions: . sodium chloride 125 mL/hr at 12/01/14 0600  . sodium chloride 10 mL/hr at 12/01/14 0600  . feeding supplement (GLUCERNA 1.2 CAL) 1,000 mL (12/01/14 0859)  . fentaNYL infusion INTRAVENOUS Stopped (12/01/14 0819)  . 0.9 % sodium chloride with kcl 100 mL/hr at 12/01/14 [place] RD LDN Clinical Dietitian [phone]

## 2014-12-01 NOTE — Progress Notes (Signed)
PULMONARY / CRITICAL CARE MEDICINE   Name: Daryl Vega MRN: [id number] DOB: [birth year]    ADMISSION DATE:  11/29/2014 CONSULTATION DATE:  11/29/2014  REFERRING MD :  Zella Richer  CHIEF COMPLAINT:  Post op vent management  INITIAL PRESENTATION: 68 y/o male admitted on 12/16 with a GI bleed.  He went for an endoscopy on 12/17 and was found to have a bleeding duodenal ulcer which rapidly progressed and he was taken to the OR on 12/17 emergently.  There he had an emergency exploratory laparotomy, oversewing of a posterior bleeding duodenal ulcer, pyloroplasty, cholecystectomy, repair of R hepatic artery, repair of cholecystoduodenal fistula and placement of a duodenostomy tube and feeding J-tube.  PCCM consulted for post op ventilator management.  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, duodenal tube placed, J tube placed, pyloroplasty, oversewing of posterior bleeding duodenal ulcer, repair of R hepatic artery 12/18: extubated   SUBJECTIVE:   VITAL SIGNS: Temp:  [97.6 F (36.4 C)-98.7 F (37.1 C)] 98.6 F (37 C) (12/18 0800) Pulse Rate:  [102-132] 111 (12/18 0800) Resp:  [10-28] 13 (12/18 0800) BP: (90-168)/(49-98) 110/57 mmHg (12/18 0318) SpO2:  [95 %-98 %] 97 % (12/18 0800) Arterial Line BP: (94-202)/(49-96) 94/49 mmHg (12/18 0800) FiO2 (%):  [30 %-40 %] 30 % (12/18 0733) HEMODYNAMICS:   VENTILATOR SETTINGS: Vent Mode:  [-] CPAP FiO2 (%):  [30 %-40 %] 30 % Set Rate:  [14 bmp] 14 bmp Vt Set:  [500 mL] 500 mL PEEP:  [5 cmH20] 5 cmH20 Pressure Support:  [5 cmH20] 5 cmH20 Plateau Pressure:  [12 cmH20-14 cmH20] 12 cmH20 INTAKE / OUTPUT:  Intake/Output Summary (Last 24 hours) at 12/01/14 0848 Last data filed at 12/01/14 0600  Gross per 24 hour  Intake 8856.55 ml  Output   5025 ml  Net  3831.55 ml    PHYSICAL EXAMINATION: General:  Pale elderly ill appearing man, sleepy s/p extubation  Neuro:  Awakens to voice. Oriented. HEEN, pupils small B, no lesions Cardiovascular:  Regular, no M Lungs:  L clear, decreased BS on the R, RR a little increased s/p extubation when awake Abdomen:  Soft, dressings intact, no BS Musculoskeletal:  No lesions, no edema Skin:  No rash  LABS:  CBC  Recent Labs Lab 11/30/14 1145 11/30/14 1429 11/30/14 1830 12/01/14 0425  WBC 2.6*  --  4.2 4.9  HGB 7.5* 6.5* 11.3* 10.4*  HCT 22.9* 19.0* 33.6* 30.5*  PLT 80*  --  86* 76*   Coag's  Recent Labs Lab 11/29/14 0644 11/30/14 1830  APTT 24  --   INR 1.21 1.32   BMET  Recent Labs Lab 11/30/14 0326 11/30/14 1429 11/30/14 1830 12/01/14 0425  NA 135* 137 140 140  K 3.7 4.4 4.6 4.3  CL 105  --  112 114*  CO2 18*  --  14* 15*  BUN 26*  --  25* 25*  CREATININE 0.87  --  0.75 0.93  GLUCOSE 167*  --  250* 187*   Electrolytes  Recent Labs Lab 11/29/14 1554 11/30/14 0326 11/30/14 1830 12/01/14 0425  CALCIUM  --  7.2* 6.6* 6.9*  MG 1.8  --   --   --    Sepsis Markers  Recent Labs Lab 11/30/14 2009  LATICACIDVEN 1.3   ABG  Recent Labs Lab 11/30/14 1429 11/30/14 1755  12/01/14 0329  PHART 7.195* 7.261* 7.265*  PCO2ART 44.7 31.1* 33.5*  PO2ART 284.0* 124.0* 162.0*   Liver Enzymes  Recent Labs Lab 11/29/14 1554 11/30/14 0326 11/30/14 1830  AST 10 10 97*  ALT 11 9 97*  ALKPHOS 48 41 47  BILITOT 1.6* 0.8 1.8*  ALBUMIN 2.6* 2.1* 2.1*   Cardiac Enzymes  Recent Labs Lab 11/29/14 1554 11/29/14 2211  TROPONINI <0.30 <0.30   Glucose  Recent Labs Lab 11/30/14 0013 11/30/14 0553 11/30/14 1714 11/30/14 2012 11/30/14 2330 12/01/14 0447  GLUCAP 122* 162* 231* 212* 202* 128*    Imaging Portable Chest Xray  11/30/2014   CLINICAL DATA:  Acute respiratory failure and hypoxia following abdominal surgery today.  EXAM: PORTABLE CHEST - 1 VIEW   COMPARISON:  10/28/2014  FINDINGS: Endotracheal tube tip 2.4 cm above the carina. Right IJ introducer sheath tip: Is SVC. Nasogastric tube enters the stomach.  And in addition to the bandlike presumed scarring in the right upper lobe shown on the prior exam, There is abnormal interstitial accentuation bilaterally is and some subsegmental atelectasis at the left lung base. No overt airspace edema. Heart size within normal limits. Degenerative right glenohumeral arthropathy. No pneumothorax.  IMPRESSION: 1. Increased bilateral interstitial accentuation compared to the prior exam, possibly from noncardiogenic edema. No overt cardiomegaly. 2. Subsegmental atelectasis at the left lung base. 3. Continued scarring in the right upper lobe. 4. Tubes and lines appear satisfactorily positioned.   Electronically Signed   By: Herbie Baltimore M.D.   On: 11/30/2014 18:05     ASSESSMENT / PLAN:  PULMONARY OETT 12/17 >> 12/18 A:  Acute respiratory failure Stage IIIB Squamous Cell Lung cancer dx 07/2014, Rx carboplatin and AUC 08/2014 At risk TRALI Passed SBT. Surgical note states no plan for repeated surgery. Extubated this am 12/18.  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  PRN analgesia    FAMILY  - Updates: Spoke with Wife, son and daughter 12/17.   - Inter-disciplinary family meet or Palliative Care meeting due by:  12/07/14.   NP summary Note Hgb drifted about 1 gm overnight but hemodynamically stable. He does have a mixed anion gap and Non-anion gap acidosis in the setting of his hyperchloremia which is the basis of his metabolic derangements. He had adequate respiratory compensation for this and actually even a small super-imposed resp alk as his expected PCO2 range would be 29-33 and he was 27.6.[name] Went ahead w/ extubation as he passed his SBT. He does have  some increased resp effort s/p extubation but have added bicarb gtt for the non-AG component of his acidosis and expect as we correct it this issue will be rectified. Will cont to monitor in ICU setting. Will need serial chemistries and f/u CBC.   Erick Colace ACNP-BC [place] Pager # [phone] OR # [phone] if no answer

## 2014-12-01 NOTE — Progress Notes (Signed)
[facility] Progress Note    Since last GI note: EGD yesterday to treat large visible vessel within duodenal bulb ulcer, precipitated arterial bleeding into GI tract.  Proceeded to OR with [name].  I spoke with him about the findings, surgical treatments yesterday.  Objective: Vital signs in last 24 hours: Temp:  [97.6 F (36.4 C)-98.7 F (37.1 C)] 97.6 F (36.4 C) (12/18 0400) Pulse Rate:  [102-132] 111 (12/18 0700) Resp:  [10-28] 13 (12/18 0700) BP: (83-168)/(49-98) 110/57 mmHg (12/18 0318) SpO2:  [95 %-98 %] 98 % (12/18 0732) Arterial Line BP: (96-202)/(50-96) 96/50 mmHg (12/18 0700) FiO2 (%):  [30 %-40 %] 30 % (12/18 0733) Last BM Date: 11/30/14 General: intubated, sedated Heart: regular rate and rythm   Lab Results:  Recent Labs  11/30/14 1145 11/30/14 1429 11/30/14 1830 12/01/14 0425  WBC 2.6*  --  4.2 4.9  HGB 7.5* 6.5* 11.3* 10.4*  PLT 80*  --  86* 76*  MCV 87.7  --  86.8 85.4    Recent Labs  11/30/14 0326 11/30/14 1429 11/30/14 1830 12/01/14 0425  NA 135* 137 140 140  K 3.7 4.4 4.6 4.3  CL 105  --  112 114*  CO2 18*  --  14* 15*  GLUCOSE 167*  --  250* 187*  BUN 26*  --  25* 25*  CREATININE 0.87  --  0.75 0.93  CALCIUM 7.2*  --  6.6* 6.9*    Recent Labs  11/29/14 1554 11/30/14 0326 11/30/14 1830  PROT 4.9* 4.1* 4.0*  ALBUMIN 2.6* 2.1* 2.1*  AST 10 10 97*  ALT 11 9 97*  ALKPHOS 48 41 47  BILITOT 1.6* 0.8 1.8*  BILIDIR 0.3  --   --   IBILI 1.3*  --   --     Recent Labs  11/29/14 0644 11/30/14 1830  INR 1.21 1.32     Studies/Results: Portable Chest Xray  11/30/2014   CLINICAL DATA:  Acute respiratory failure and hypoxia following abdominal surgery today.  EXAM: PORTABLE CHEST - 1 VIEW  COMPARISON:  10/28/2014  FINDINGS: Endotracheal tube tip 2.4 cm above the carina. Right IJ introducer sheath tip: Is SVC. Nasogastric tube enters the stomach.  And in addition to the bandlike presumed scarring in the right upper  lobe shown on the prior exam, There is abnormal interstitial accentuation bilaterally is and some subsegmental atelectasis at the left lung base. No overt airspace edema. Heart size within normal limits. Degenerative right glenohumeral arthropathy. No pneumothorax.  IMPRESSION: 1. Increased bilateral interstitial accentuation compared to the prior exam, possibly from noncardiogenic edema. No overt cardiomegaly. 2. Subsegmental atelectasis at the left lung base. 3. Continued scarring in the right upper lobe. 4. Tubes and lines appear satisfactorily positioned.   Electronically Signed   By: Sherryl Barters M.D.   On: 11/30/2014 18:05     Medications: Scheduled Meds: . sodium chloride   Intravenous Once  . antiseptic oral rinse  7 mL Mouth Rinse QID  . budesonide-formoterol  2 puff Inhalation BID  .  ceFAZolin (ANCEF) IV  1 g Intravenous Q6H  . chlorhexidine  15 mL Mouth Rinse BID  . chlorhexidine  15 mL Mouth Rinse BID  . fentaNYL  50 mcg Intravenous Once  . insulin aspart  0-15 Units Subcutaneous 6 times per day  . pantoprazole (PROTONIX) IV  40 mg Intravenous Q12H  . sodium chloride  3 mL Intravenous Q12H   Continuous Infusions: . sodium chloride 125 mL/hr at 12/01/14 0600  .  sodium chloride 10 mL/hr at 12/01/14 0600  . feeding supplement (GLUCERNA 1.2 CAL)    . fentaNYL infusion INTRAVENOUS 175 mcg/hr (12/01/14 0730)  . 0.9 % sodium chloride with kcl 100 mL/hr at 12/01/14 0600   PRN Meds:.fentaNYL, HYDROcodone-acetaminophen, labetalol, levalbuterol, midazolam, morphine injection, ondansetron **OR** ondansetron (ZOFRAN) IV, oxyCODONE, prochlorperazine    Assessment/Plan: 68 y.o. male with duodenal bulb ulcer, bleeding; also choledochoduodenal fistula noted during the case, repaired, j tube placed  Recovering from surgery well so far but could take a long time to fully recover from this.  Looks like bleeding has successfully been stopped.  Greatly appreciate expeditious surgical  support after unsuccessful EGD yesterday.    [name], MD  12/01/2014, 7:58 AM [facility] Pager [phone]

## 2014-12-01 NOTE — Progress Notes (Signed)
[facility] Progress Note Patient Name: Daryl Vega DOB: December 28, 1945 MRN: [id number]   Date of Service  12/01/2014  HPI/Events of Note    eICU Interventions  Hypokalemia, repleted      Intervention Category Minor Interventions: Electrolytes abnormality - evaluation and management  Alto Gandolfo 12/01/2014, 7:46 PM

## 2014-12-01 NOTE — Progress Notes (Signed)
Wasted 130 mL of fentanyl in the med room sink with Jerrel Ivory, RN.

## 2014-12-01 NOTE — Progress Notes (Signed)
1 Day Post-Op  Subjective: Sedated on vent.  Opens eyes to voice.  Objective: Vital signs in last 24 hours: Temp:  [97.6 F (36.4 C)-98.7 F (37.1 C)] 97.6 F (36.4 C) (12/18 0400) Pulse Rate:  [102-132] 111 (12/18 0700) Resp:  [10-28] 13 (12/18 0700) BP: (83-168)/(49-98) 110/57 mmHg (12/18 0318) SpO2:  [95 %-98 %] 98 % (12/18 0732) Arterial Line BP: (96-202)/(50-96) 96/50 mmHg (12/18 0700) FiO2 (%):  [30 %-40 %] 30 % (12/18 0733) Last BM Date: 11/30/14  Intake/Output from previous day: 12/17 0701 - 12/18 0700 In: 9275.1 [I.V.:7057.6; Blood:2057.4; NG/GT:60; IV Piggyback:100] Out: 5325 [Urine:2750; Emesis/NG output:150; Drains:425; Blood:2000] Intake/Output this shift:    PE: General- In NAD Abdomen-soft, dressing dry, serous drain output, bilious duodenstomy output (as expected)  Lab Results:   Recent Labs  11/30/14 1830 12/01/14 0425  WBC 4.2 4.9  HGB 11.3* 10.4*  HCT 33.6* 30.5*  PLT 86* 76*   BMET  Recent Labs  11/30/14 1830 12/01/14 0425  NA 140 140  K 4.6 4.3  CL 112 114*  CO2 14* 15*  GLUCOSE 250* 187*  BUN 25* 25*  CREATININE 0.75 0.93  CALCIUM 6.6* 6.9*   PT/INR  Recent Labs  11/29/14 0644 11/30/14 1830  LABPROT 15.5* 16.5*  INR 1.21 1.32   Comprehensive Metabolic Panel:    Component Value Date/Time   NA 140 12/01/2014 0425   NA 140 11/30/2014 1830   NA 139 11/21/2014 1440   NA 139 11/15/2014 0912   K 4.3 12/01/2014 0425   K 4.6 11/30/2014 1830   K 3.8 11/21/2014 1440   K 3.1* 11/15/2014 0912   CL 114* 12/01/2014 0425   CL 112 11/30/2014 1830   CO2 15* 12/01/2014 0425   CO2 14* 11/30/2014 1830   CO2 22 11/21/2014 1440   CO2 25 11/15/2014 0912   BUN 25* 12/01/2014 0425   BUN 25* 11/30/2014 1830   BUN 14.5 11/21/2014 1440   BUN 14.4 11/15/2014 0912   CREATININE 0.93 12/01/2014 0425   CREATININE 0.75 11/30/2014 1830   CREATININE 0.9 11/21/2014 1440   CREATININE 0.9 11/15/2014 0912   GLUCOSE 187* 12/01/2014 0425   GLUCOSE  250* 11/30/2014 1830   GLUCOSE 150* 11/21/2014 1440   GLUCOSE 122 11/15/2014 0912   CALCIUM 6.9* 12/01/2014 0425   CALCIUM 6.6* 11/30/2014 1830   CALCIUM 9.8 11/21/2014 1440   CALCIUM 9.3 11/15/2014 0912   AST 97* 11/30/2014 1830   AST 10 11/30/2014 0326   AST 15 11/21/2014 1440   AST 15 11/15/2014 0912   ALT 97* 11/30/2014 1830   ALT 9 11/30/2014 0326   ALT 18 11/21/2014 1440   ALT 19 11/15/2014 0912   ALKPHOS 47 11/30/2014 1830   ALKPHOS 41 11/30/2014 0326   ALKPHOS 95 11/21/2014 1440   ALKPHOS 92 11/15/2014 0912   BILITOT 1.8* 11/30/2014 1830   BILITOT 0.8 11/30/2014 0326   BILITOT 0.83 11/21/2014 1440   BILITOT 0.65 11/15/2014 0912   PROT 4.0* 11/30/2014 1830   PROT 4.1* 11/30/2014 0326   PROT 6.8 11/21/2014 1440   PROT 6.5 11/15/2014 0912   ALBUMIN 2.1* 11/30/2014 1830   ALBUMIN 2.1* 11/30/2014 0326   ALBUMIN 3.6 11/21/2014 1440   ALBUMIN 3.4* 11/15/2014 0912     Studies/Results: Portable Chest Xray  11/30/2014   CLINICAL DATA:  Acute respiratory failure and hypoxia following abdominal surgery today.  EXAM: PORTABLE CHEST - 1 VIEW  COMPARISON:  10/28/2014  FINDINGS: Endotracheal tube tip 2.4 cm above  the carina. Right IJ introducer sheath tip: Is SVC. Nasogastric tube enters the stomach.  And in addition to the bandlike presumed scarring in the right upper lobe shown on the prior exam, There is abnormal interstitial accentuation bilaterally is and some subsegmental atelectasis at the left lung base. No overt airspace edema. Heart size within normal limits. Degenerative right glenohumeral arthropathy. No pneumothorax.  IMPRESSION: 1. Increased bilateral interstitial accentuation compared to the prior exam, possibly from noncardiogenic edema. No overt cardiomegaly. 2. Subsegmental atelectasis at the left lung base. 3. Continued scarring in the right upper lobe. 4. Tubes and lines appear satisfactorily positioned.   Electronically Signed   By: Sherryl Barters M.D.   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  Start j-tube feeds.   Errin Whitelaw J 12/01/2014

## 2014-12-02 DIAGNOSIS — R739 Hyperglycemia, unspecified: Secondary | ICD-10-CM

## 2014-12-02 DIAGNOSIS — C3400 Malignant neoplasm of unspecified main bronchus: Secondary | ICD-10-CM

## 2014-12-02 DIAGNOSIS — D649 Anemia, unspecified: Secondary | ICD-10-CM

## 2014-12-02 LAB — BASIC METABOLIC PANEL
Anion gap: 8 (ref 5–15)
BUN: 15 mg/dL (ref 6–23)
CALCIUM: 7.7 mg/dL — AB (ref 8.4–10.5)
CO2: 23 meq/L (ref 19–32)
CREATININE: 0.78 mg/dL (ref 0.50–1.35)
Chloride: 109 mEq/L (ref 96–112)
GFR calc Af Amer: >90 mL/min (ref >90)
GLUCOSE: 164 mg/dL — AB (ref 70–99)
Potassium: 3.5 mEq/L — ABNORMAL LOW (ref 3.7–5.3)
Sodium: 140 mEq/L (ref 137–147)

## 2014-12-02 LAB — CBC
HCT: 25.3 % — ABNORMAL LOW (ref 39.0–52.0)
HCT: 23.7 % — ABNORMAL LOW (ref 39.0–52.0)
HEMOGLOBIN: 8.4 g/dL — AB (ref 13.0–17.0)
Hemoglobin: 7.9 g/dL — ABNORMAL LOW (ref 13.0–17.0)
MCH: 29.1 pg (ref 26.0–34.0)
MCH: 29.4 pg (ref 26.0–34.0)
MCHC: 33.2 g/dL (ref 30.0–36.0)
MCHC: 33.3 g/dL (ref 30.0–36.0)
MCV: 87.5 fL (ref 78.0–100.0)
MCV: 88.1 fL (ref 78.0–100.0)
PLATELETS: 63 10*3/uL — AB (ref 150–400)
Platelets: 68 10*3/uL — ABNORMAL LOW (ref 150–400)
RBC: 2.89 MIL/uL — AB (ref 4.22–5.81)
RBC: 2.69 MIL/uL — ABNORMAL LOW (ref 4.22–5.81)
RDW: 18 % — ABNORMAL HIGH (ref 11.5–15.5)
RDW: 18.4 % — AB (ref 11.5–15.5)
WBC: 2.5 10*3/uL — ABNORMAL LOW (ref 4.0–10.5)
WBC: 2.2 10*3/uL — AB (ref 4.0–10.5)

## 2014-12-02 LAB — GLUCOSE, CAPILLARY
GLUCOSE-CAPILLARY: 237 mg/dL — AB (ref 70–99)
GLUCOSE-CAPILLARY: 139 mg/dL — AB (ref 70–99)
GLUCOSE-CAPILLARY: 135 mg/dL — AB (ref 70–99)
Glucose-Capillary: 164 mg/dL — ABNORMAL HIGH (ref 70–99)
Glucose-Capillary: 156 mg/dL — ABNORMAL HIGH (ref 70–99)
Glucose-Capillary: 146 mg/dL — ABNORMAL HIGH (ref 70–99)

## 2014-12-02 LAB — MAGNESIUM: Magnesium: 1.8 mg/dL (ref 1.5–2.5)

## 2014-12-02 LAB — CALCIUM, IONIZED: Calcium, Ion: 1.14 mmol/L (ref 1.13–1.30)

## 2014-12-02 LAB — LACTIC ACID, PLASMA: Lactic Acid, Venous: 0.9 mmol/L (ref 0.5–2.2)

## 2014-12-02 LAB — PHOSPHORUS: Phosphorus: 1.9 mg/dL — ABNORMAL LOW (ref 2.3–4.6)

## 2014-12-02 MED ORDER — POTASSIUM CHLORIDE 10 MEQ/50ML IV SOLN
10.0000 meq | INTRAVENOUS | Status: AC
  Administered 2014-12-02 (×2): 10 meq via INTRAVENOUS
  Filled 2014-12-02: qty 50

## 2014-12-02 MED ORDER — SODIUM PHOSPHATE 3 MMOLE/ML IV SOLN
20.0000 mmol | Freq: Once | INTRAVENOUS | Status: AC
  Administered 2014-12-02: 20 mmol via INTRAVENOUS
  Filled 2014-12-02: qty 6.67

## 2014-12-02 MED ORDER — GLUCERNA 1.2 CAL PO LIQD
1000.0000 mL | ORAL | Status: DC
  Administered 2014-12-02 (×2): 1000 mL
  Filled 2014-12-02: qty 1000

## 2014-12-02 MED ORDER — SODIUM CHLORIDE 0.45 % IV SOLN
INTRAVENOUS | Status: DC
  Administered 2014-12-02 – 2014-12-05 (×2): via INTRAVENOUS
  Administered 2014-12-06: 10 mL/h via INTRAVENOUS
  Administered 2014-12-09: 21:00:00 via INTRAVENOUS

## 2014-12-02 MED ORDER — HYDROMORPHONE HCL 1 MG/ML IJ SOLN
0.5000 mg | INTRAMUSCULAR | Status: DC | PRN
  Administered 2014-12-02 – 2014-12-03 (×6): 2 mg via INTRAVENOUS
  Administered 2014-12-03 (×2): 1 mg via INTRAVENOUS
  Administered 2014-12-03 – 2014-12-04 (×5): 2 mg via INTRAVENOUS
  Administered 2014-12-04: 1 mg via INTRAVENOUS
  Administered 2014-12-04: 2 mg via INTRAVENOUS
  Administered 2014-12-04 – 2014-12-05 (×7): 1 mg via INTRAVENOUS
  Filled 2014-12-02: qty 1
  Filled 2014-12-02: qty 2
  Filled 2014-12-02 (×5): qty 1
  Filled 2014-12-02: qty 2
  Filled 2014-12-02: qty 1
  Filled 2014-12-02 (×4): qty 2
  Filled 2014-12-02: qty 1
  Filled 2014-12-02: qty 2
  Filled 2014-12-02: qty 1
  Filled 2014-12-02 (×2): qty 2
  Filled 2014-12-02: qty 1
  Filled 2014-12-02 (×3): qty 2

## 2014-12-02 MED ORDER — MAGNESIUM SULFATE 2 GM/50ML IV SOLN
2.0000 g | Freq: Once | INTRAVENOUS | Status: AC
  Administered 2014-12-02: 2 g via INTRAVENOUS
  Filled 2014-12-02: qty 50

## 2014-12-02 NOTE — Progress Notes (Signed)
Subjective: The patient is seen and examined today. His son [name] was at the bedside. He is feeling fine today except for the pain on the right upper part of the abdomen at the incision site. He recently underwent an emergency exploratory laparotomy with oversewing of posterior bleeding duodenal ulcer, Heineke Mikulicz pyloroplasty, cholecystectomy, repair of right hepatic artery, repair of cholecystoduodenal fistula and placement of duodenostomy tube, feeding jejunostomy under the care of [name]. He is recovering slowly from this surgery and currently extubated but has a nasogastric tube suctioning. He denied having any fever or chills.  Objective: Vital signs in last 24 hours: Temp:  [98 F (36.7 C)-98.7 F (37.1 C)] 98 F (36.7 C) (12/19 0820) Pulse Rate:  [102-114] 103 (12/19 0700) Resp:  [9-29] 11 (12/19 0700) BP: (89-125)/(52-70) 98/54 mmHg (12/19 0700) SpO2:  [95 %-100 %] 96 % (12/19 0700) Arterial Line BP: (78)/(52) 78/52 mmHg (12/18 1000) Weight:  [165 lb 12.6 oz (75.2 kg)] 165 lb 12.6 oz (75.2 kg) (12/19 0300)  Intake/Output from previous day: 12/18 0701 - 12/19 0700 In: 3379.5 [I.V.:1993.5; NG/GT:450.3; IV Piggyback:606.7] Out: 2705 [Urine:1985; Drains:720] Intake/Output this shift:    General appearance: alert, cooperative, fatigued and no distress Resp: clear to auscultation bilaterally Cardio: regular rate and rhythm, S1, S2 normal, no murmur, click, rub or gallop GI: Tender at the incision site in the right upper quadrant Extremities: extremities normal, atraumatic, no cyanosis or edema  Lab Results:   Recent Labs  12/01/14 2245 12/02/14 0300  WBC 2.8* 2.5*  HGB 7.9* 8.4*  HCT 22.5* 25.3*  PLT 61* 68*   BMET  Recent Labs  12/01/14 1500 12/02/14 0300  NA 141 140  K 3.4* 3.5*  CL 112 109  CO2 17* 23  GLUCOSE 199* 164*  BUN 20 15  CREATININE 0.80 0.78  CALCIUM 7.0* 7.7*    Studies/Results: Portable Chest Xray  11/30/2014   CLINICAL DATA:   Acute respiratory failure and hypoxia following abdominal surgery today.  EXAM: PORTABLE CHEST - 1 VIEW  COMPARISON:  10/28/2014  FINDINGS: Endotracheal tube tip 2.4 cm above the carina. Right IJ introducer sheath tip: Is SVC. Nasogastric tube enters the stomach.  And in addition to the bandlike presumed scarring in the right upper lobe shown on the prior exam, There is abnormal interstitial accentuation bilaterally is and some subsegmental atelectasis at the left lung base. No overt airspace edema. Heart size within normal limits. Degenerative right glenohumeral arthropathy. No pneumothorax.  IMPRESSION: 1. Increased bilateral interstitial accentuation compared to the prior exam, possibly from noncardiogenic edema. No overt cardiomegaly. 2. Subsegmental atelectasis at the left lung base. 3. Continued scarring in the right upper lobe. 4. Tubes and lines appear satisfactorily positioned.   Electronically Signed   By: Sherryl Barters M.D.   On: 11/30/2014 18:05    Medications: I have reviewed the patient's current medications.  Assessment/Plan: 1) stage IIIB non-small cell lung cancer, squamous cell carcinoma status post 5 cycles of systemic chemotherapy with carboplatin and Abraxane. He tolerated the previous treatment well except for fatigue. Imaging studies after cycle #3 showed evidence for improvement in his disease. I would discontinue his chemotherapy at this point because of his current condition. He will be monitored closely for any disease progression with imaging studies on outpatient basis and few months. 2) bleeding duodenal ulcer: Status post emergency exploratory laparotomy, oversewing of posterior bleeding duodenal ulcer, Heineke Mikulicz pyloroplasty, cholecystectomy, repair of right hepatic artery, repair of cholecystoduodenal fistula and placement of duodenostomy  tube, feeding jejunostomy. I really appreciate the care provided by Dr. [name] and [name] for this patient. They really  saved his life with great team work. The patient is still in critical condition. The patient and his son understand that his condition is still serious. Thank you so much for taking good care of Mr. Daryl Vega, I will continue to follow up the patient with you and assist in his management on as-needed basis.   LOS: 3 days    Dynastee Brummell K. 12/02/2014

## 2014-12-02 NOTE — Progress Notes (Signed)
Pt has 400 cc dark red output from NG tube from 19:00 to 00:00.  Flushed NG tube.  Output is now clear.  Hgb has dropped from 8.5 to 7.9.  Called [facility] and was instructed by Dr. Jimmy Footman to continue to monitor and inform if 05:00 CBC demonstrates continued blood loss or if NG tube continues to produve dark red output.

## 2014-12-02 NOTE — Progress Notes (Signed)
PULMONARY / CRITICAL CARE MEDICINE   Name: Daryl Vega MRN: [id number] DOB: 1946/01/22    ADMISSION DATE:  11/29/2014 CONSULTATION DATE:  11/29/2014  REFERRING MD :  Zella Richer  CHIEF COMPLAINT:  Post op vent management  INITIAL PRESENTATION: 68 y/o male admitted on 12/16 with a GI bleed.  He went for an endoscopy on 12/17 and was found to have a bleeding duodenal ulcer which rapidly progressed and he was taken to the OR on 12/17 emergently.  There he had an emergency exploratory laparotomy, oversewing of a posterior bleeding duodenal ulcer, pyloroplasty, cholecystectomy, repair of R hepatic artery, repair of cholecystoduodenal fistula and placement of a duodenostomy tube and feeding J-tube.  PCCM consulted for post op ventilator management.  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, duodenal tube placed, J tube placed, pyloroplasty, oversewing of posterior bleeding duodenal ulcer, repair of R hepatic artery 12/18: extubated   SUBJECTIVE:  Complaining of pain at site of surgery  VITAL SIGNS: Temp:  [98 F (36.7 C)-98.7 F (37.1 C)] 98 F (36.7 C) (12/19 0820) Pulse Rate:  [102-114] 103 (12/19 0700) Resp:  [9-29] 11 (12/19 0700) BP: (89-125)/(52-70) 98/54 mmHg (12/19 0700) SpO2:  [95 %-100 %] 96 % (12/19 0700) Arterial Line BP: (78)/(52) 78/52 mmHg (12/18 1000) Weight:  [75.2 kg (165 lb 12.6 oz)] 75.2 kg (165 lb 12.6 oz) (12/19 0300) HEMODYNAMICS:   VENTILATOR SETTINGS:   INTAKE / OUTPUT:  Intake/Output Summary (Last 24 hours) at 12/02/14 0918 Last data filed at 12/02/14 0600  Gross per 24 hour  Intake 3082.34 ml  Output   2525 ml  Net 557.34 ml    PHYSICAL EXAMINATION: General:  Chronically ill appearing, no distress HEENT: NCAT, EOMi PULM: CTA B CV: RRR, no mgr AB: BS+,  duodenostomy tube, feeding J, JP drain all in place Ext: warm, no edema Neuro: A&Ox4, maew  LABS:  CBC  Recent Labs Lab 12/01/14 1500 12/01/14 2245 12/02/14 0300  WBC 3.2* 2.8* 2.5*  HGB 8.5* 7.9* 8.4*  HCT 25.0* 22.5* 25.3*  PLT 71* 61* 68*   Coag's  Recent Labs Lab 11/29/14 0644 11/30/14 1830  APTT 24  --   INR 1.21 1.32   BMET  Recent Labs Lab 12/01/14 0425 12/01/14 1500 12/02/14 0300  NA 140 141 140  K 4.3 3.4* 3.5*  CL 114* 112 109  CO2 15* 17* 23  BUN 25* 20 15  CREATININE 0.93 0.80 0.78  GLUCOSE 187* 199* 164*   Electrolytes  Recent Labs Lab 11/29/14 1554  12/01/14 0425 12/01/14 1500 12/02/14 0300  CALCIUM  --   < > 6.9* 7.0* 7.7*  MG 1.8  --   --  1.2* 1.8  PHOS  --   --   --  2.0* 1.9*  < > = values in this interval not displayed. Sepsis Markers  Recent Labs Lab 11/30/14 2009 12/01/14 1500 12/02/14 0446  LATICACIDVEN 1.3 1.3 0.9   ABG  Recent Labs Lab 11/30/14 1755 12/01/14 0329 12/01/14 0910  PHART 7.261* 7.265* 7.341*  PCO2ART 31.1* 33.5* 27.6*  PO2ART 124.0* 162.0* 128.0*   Liver Enzymes  Recent Labs Lab 11/29/14 1554 11/30/14 0326 11/30/14 1830  AST 10 10 97*  ALT 11 9 97*  ALKPHOS 48 41 47  BILITOT 1.6* 0.8 1.8*  ALBUMIN 2.6* 2.1* 2.1*   Cardiac  Enzymes  Recent Labs Lab 11/29/14 1554 11/29/14 2211  TROPONINI <0.30 <0.30   Glucose  Recent Labs Lab 12/01/14 1543 12/01/14 1932 12/01/14 2317 12/02/14 0419 12/02/14 0423 12/02/14 0834  GLUCAP 190* 185* 164* 237* 164* 156*    Imaging No results found.   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  PRN analgesia  > change to dilaudid 12/19   FAMILY  - Updates: Spoke son bedside 12/19  Roselie Awkward, MD [facility] Pager: [phone] Cell: [phone] If no response, call [phone]

## 2014-12-02 NOTE — Progress Notes (Signed)
2 Days Post-Op  Subjective: Extubated.  Has incisional pain.  Wants ice chips.  Conduct of operation discussed with him.  Objective: Vital signs in last 24 hours: Temp:  [98.4 F (36.9 C)-98.7 F (37.1 C)] 98.5 F (36.9 C) (12/19 0300) Pulse Rate:  [102-114] 103 (12/19 0700) Resp:  [9-29] 11 (12/19 0700) BP: (89-125)/(52-74) 98/54 mmHg (12/19 0700) SpO2:  [95 %-100 %] 96 % (12/19 0700) Arterial Line BP: (66-94)/(49-61) 78/52 mmHg (12/18 1000) FiO2 (%):  [30 %] 30 % (12/18 0800) Weight:  [165 lb 12.6 oz (75.2 kg)] 165 lb 12.6 oz (75.2 kg) (12/19 0300) Last BM Date: 11/30/14  Intake/Output from previous day: 12/18 0701 - 12/19 0700 In: 3379.5 [I.V.:1993.5; NG/GT:450.3; IV Piggyback:606.7] Out: 2705 [Urine:1985; Drains:720] Intake/Output this shift:    PE: General- In NAD Abdomen-soft, dressing dry, serous drain output, bilious duodenstomy output (as expected), hypoactive bowel sounds  Lab Results:   Recent Labs  12/01/14 2245 12/02/14 0300  WBC 2.8* 2.5*  HGB 7.9* 8.4*  HCT 22.5* 25.3*  PLT 61* 68*   BMET  Recent Labs  12/01/14 1500 12/02/14 0300  NA 141 140  K 3.4* 3.5*  CL 112 109  CO2 17* 23  GLUCOSE 199* 164*  BUN 20 15  CREATININE 0.80 0.78  CALCIUM 7.0* 7.7*   PT/INR  Recent Labs  11/30/14 1830  LABPROT 16.5*  INR 1.32   Comprehensive Metabolic Panel:    Component Value Date/Time   NA 140 12/02/2014 0300   NA 141 12/01/2014 1500   NA 139 11/21/2014 1440   NA 139 11/15/2014 0912   K 3.5* 12/02/2014 0300   K 3.4* 12/01/2014 1500   K 3.8 11/21/2014 1440   K 3.1* 11/15/2014 0912   CL 109 12/02/2014 0300   CL 112 12/01/2014 1500   CO2 23 12/02/2014 0300   CO2 17* 12/01/2014 1500   CO2 22 11/21/2014 1440   CO2 25 11/15/2014 0912   BUN 15 12/02/2014 0300   BUN 20 12/01/2014 1500   BUN 14.5 11/21/2014 1440   BUN 14.4 11/15/2014 0912   CREATININE 0.78 12/02/2014 0300   CREATININE 0.80 12/01/2014 1500   CREATININE 0.9 11/21/2014 1440   CREATININE 0.9 11/15/2014 0912   GLUCOSE 164* 12/02/2014 0300   GLUCOSE 199* 12/01/2014 1500   GLUCOSE 150* 11/21/2014 1440   GLUCOSE 122 11/15/2014 0912   CALCIUM 7.7* 12/02/2014 0300   CALCIUM 7.0* 12/01/2014 1500   CALCIUM 9.8 11/21/2014 1440   CALCIUM 9.3 11/15/2014 0912   AST 97* 11/30/2014 1830   AST 10 11/30/2014 0326   AST 15 11/21/2014 1440   AST 15 11/15/2014 0912   ALT 97* 11/30/2014 1830   ALT 9 11/30/2014 0326   ALT 18 11/21/2014 1440   ALT 19 11/15/2014 0912   ALKPHOS 47 11/30/2014 1830   ALKPHOS 41 11/30/2014 0326   ALKPHOS 95 11/21/2014 1440   ALKPHOS 92 11/15/2014 0912   BILITOT 1.8* 11/30/2014 1830   BILITOT 0.8 11/30/2014 0326   BILITOT 0.83 11/21/2014 1440   BILITOT 0.65 11/15/2014 0912   PROT 4.0* 11/30/2014 1830   PROT 4.1* 11/30/2014 0326   PROT 6.8 11/21/2014 1440   PROT 6.5 11/15/2014 0912   ALBUMIN 2.1* 11/30/2014 1830   ALBUMIN 2.1* 11/30/2014 0326   ALBUMIN 3.6 11/21/2014 1440   ALBUMIN 3.4* 11/15/2014 0912     Studies/Results: Portable Chest Xray  11/30/2014   CLINICAL DATA:  Acute respiratory failure and hypoxia following abdominal surgery today.  EXAM:  PORTABLE CHEST - 1 VIEW  COMPARISON:  10/28/2014  FINDINGS: Endotracheal tube tip 2.4 cm above the carina. Right IJ introducer sheath tip: Is SVC. Nasogastric tube enters the stomach.  And in addition to the bandlike presumed scarring in the right upper lobe shown on the prior exam, There is abnormal interstitial accentuation bilaterally is and some subsegmental atelectasis at the left lung base. No overt airspace edema. Heart size within normal limits. Degenerative right glenohumeral arthropathy. No pneumothorax.  IMPRESSION: 1. Increased bilateral interstitial accentuation compared to the prior exam, possibly from noncardiogenic edema. No overt cardiomegaly. 2. Subsegmental atelectasis at the left lung base. 3. Continued scarring in the right upper lobe. 4. Tubes and lines appear satisfactorily  positioned.   Electronically Signed   By: Sherryl Barters M.D.   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  Increase tube feeds slowly.   Allayna Erlich J 12/02/2014

## 2014-12-02 NOTE — Progress Notes (Signed)
[facility] ADULT ICU REPLACEMENT PROTOCOL FOR AM LAB REPLACEMENT ONLY  The patient does apply for the [facility] Adult ICU Electrolyte Replacment Protocol based on the criteria listed below:   1. Is GFR >/= 40 ml/min? Yes.    Patient's GFR today is >90 2. Is urine output >/= 0.5 ml/kg/hr for the last 6 hours? Yes.   Patient's UOP is 0.6 ml/kg/hr 3. Is BUN < 60 mg/dL? Yes.    Patient's BUN today is 15 4. Abnormal electrolyte(s): K+3.5  Mg 1.8  Phos 1.9 5. Ordered repletion with: protocol 6. If a panic level lab has been reported, has the CCM MD in charge been notified? No..   Physician:  Nicanor Bake [facility] 12/02/2014 4:58 AM

## 2014-12-03 ENCOUNTER — Inpatient Hospital Stay (HOSPITAL_COMMUNITY): Payer: Medicare Other

## 2014-12-03 LAB — CBC WITH DIFFERENTIAL/PLATELET
BASOS PCT: 0 % (ref 0–1)
Basophils Absolute: 0 10*3/uL (ref 0.0–0.1)
EOS PCT: 0 % (ref 0–5)
Eosinophils Absolute: 0 10*3/uL (ref 0.0–0.7)
HEMATOCRIT: 23.1 % — AB (ref 39.0–52.0)
HEMOGLOBIN: 7.9 g/dL — AB (ref 13.0–17.0)
Lymphocytes Relative: 12 % (ref 12–46)
Lymphs Abs: 0.3 10*3/uL — ABNORMAL LOW (ref 0.7–4.0)
MCH: 30.2 pg (ref 26.0–34.0)
MCHC: 34.2 g/dL (ref 30.0–36.0)
MCV: 88.2 fL (ref 78.0–100.0)
Monocytes Absolute: 0.3 10*3/uL (ref 0.1–1.0)
Monocytes Relative: 12 % (ref 3–12)
NEUTROS ABS: 2.2 10*3/uL (ref 1.7–7.7)
Neutrophils Relative %: 76 % (ref 43–77)
Platelets: 59 10*3/uL — ABNORMAL LOW (ref 150–400)
RBC: 2.62 MIL/uL — ABNORMAL LOW (ref 4.22–5.81)
RDW: 18.8 % — ABNORMAL HIGH (ref 11.5–15.5)
WBC: 2.8 10*3/uL — AB (ref 4.0–10.5)

## 2014-12-03 LAB — TYPE AND SCREEN
ABO/RH(D): B POS
Antibody Screen: NEGATIVE
UNIT DIVISION: 0
UNIT DIVISION: 0
UNIT DIVISION: 0
UNIT DIVISION: 0
UNIT DIVISION: 0
Unit division: 0
Unit division: 0
Unit division: 0
Unit division: 0
Unit division: 0
Unit division: 0

## 2014-12-03 LAB — GLUCOSE, CAPILLARY
GLUCOSE-CAPILLARY: 139 mg/dL — AB (ref 70–99)
GLUCOSE-CAPILLARY: 146 mg/dL — AB (ref 70–99)
GLUCOSE-CAPILLARY: 164 mg/dL — AB (ref 70–99)
Glucose-Capillary: 143 mg/dL — ABNORMAL HIGH (ref 70–99)
Glucose-Capillary: 142 mg/dL — ABNORMAL HIGH (ref 70–99)
Glucose-Capillary: 148 mg/dL — ABNORMAL HIGH (ref 70–99)

## 2014-12-03 LAB — BASIC METABOLIC PANEL
Anion gap: 9 (ref 5–15)
BUN: 13 mg/dL (ref 6–23)
CHLORIDE: 102 meq/L (ref 96–112)
CO2: 27 meq/L (ref 19–32)
CREATININE: 0.68 mg/dL (ref 0.50–1.35)
Calcium: 7.9 mg/dL — ABNORMAL LOW (ref 8.4–10.5)
GFR calc Af Amer: >90 mL/min (ref >90)
GFR calc non Af Amer: >90 mL/min (ref >90)
Glucose, Bld: 155 mg/dL — ABNORMAL HIGH (ref 70–99)
Potassium: 3.2 mEq/L — ABNORMAL LOW (ref 3.7–5.3)
Sodium: 138 mEq/L (ref 137–147)

## 2014-12-03 LAB — PHOSPHORUS: Phosphorus: 2.3 mg/dL (ref 2.3–4.6)

## 2014-12-03 LAB — MAGNESIUM: Magnesium: 1.7 mg/dL (ref 1.5–2.5)

## 2014-12-03 LAB — PROCALCITONIN: Procalcitonin: 0.43 ng/mL

## 2014-12-03 MED ORDER — GLUCERNA 1.2 CAL PO LIQD
1000.0000 mL | ORAL | Status: DC
  Administered 2014-12-03: 1000 mL
  Filled 2014-12-03 (×2): qty 1000

## 2014-12-03 MED ORDER — SODIUM PHOSPHATE 3 MMOLE/ML IV SOLN
10.0000 mmol | Freq: Once | INTRAVENOUS | Status: AC
  Administered 2014-12-03: 10 mmol via INTRAVENOUS
  Filled 2014-12-03: qty 3.33

## 2014-12-03 MED ORDER — VANCOMYCIN HCL IN DEXTROSE 750-5 MG/150ML-% IV SOLN
750.0000 mg | Freq: Once | INTRAVENOUS | Status: AC
  Administered 2014-12-03: 750 mg via INTRAVENOUS
  Filled 2014-12-03: qty 150

## 2014-12-03 MED ORDER — PIPERACILLIN-TAZOBACTAM 3.375 G IVPB
3.3750 g | Freq: Three times a day (TID) | INTRAVENOUS | Status: DC
  Administered 2014-12-03 – 2014-12-05 (×5): 3.375 g via INTRAVENOUS
  Filled 2014-12-03 (×4): qty 50

## 2014-12-03 MED ORDER — LEVALBUTEROL HCL 0.63 MG/3ML IN NEBU: 0.6300 mg | INHALATION_SOLUTION | RESPIRATORY_TRACT | Status: DC | PRN

## 2014-12-03 MED ORDER — POTASSIUM CHLORIDE 20 MEQ/15ML (10%) PO SOLN
30.0000 meq | ORAL | Status: AC
  Administered 2014-12-03 (×2): 30 meq
  Filled 2014-12-03 (×2): qty 30

## 2014-12-03 MED ORDER — PIPERACILLIN-TAZOBACTAM 3.375 G IVPB 30 MIN
3.3750 g | Freq: Once | INTRAVENOUS | Status: AC
  Administered 2014-12-03: 3.375 g via INTRAVENOUS
  Filled 2014-12-03: qty 50

## 2014-12-03 MED ORDER — MAGNESIUM SULFATE 2 GM/50ML IV SOLN
2.0000 g | Freq: Once | INTRAVENOUS | Status: AC
  Administered 2014-12-03: 2 g via INTRAVENOUS
  Filled 2014-12-03: qty 50

## 2014-12-03 MED ORDER — VANCOMYCIN HCL IN DEXTROSE 750-5 MG/150ML-% IV SOLN
750.0000 mg | Freq: Three times a day (TID) | INTRAVENOUS | Status: DC
  Administered 2014-12-03 – 2014-12-05 (×5): 750 mg via INTRAVENOUS
  Filled 2014-12-03 (×6): qty 150

## 2014-12-03 NOTE — Progress Notes (Signed)
3 Days Post-Op  Subjective: Sore in upper abdomen.  States he has passed gas.  Objective: Vital signs in last 24 hours: Temp:  [98 F (36.7 C)-99.5 F (37.5 C)] 98.2 F (36.8 C) (12/20 0300) Pulse Rate:  [103-120] 111 (12/20 0600) Resp:  [10-23] 11 (12/20 0600) BP: (87-116)/(45-67) 87/54 mmHg (12/20 0600) SpO2:  [88 %-97 %] 91 % (12/20 0600) FiO2 (%):  [35 %-50 %] 35 % (12/20 0600) Weight:  [161 lb 2.5 oz (73.1 kg)] 161 lb 2.5 oz (73.1 kg) (12/20 0300) Last BM Date: 11/30/14  Intake/Output from previous day: 12/19 0701 - 12/20 0700 In: 1780.4 [I.V.:687.1; NG/GT:790; IV Piggyback:303.3] Out: 2695 [Urine:1805; Emesis/NG output:450; Drains:440] Intake/Output this shift:    PE: General- In NAD Abdomen-soft, dressing dry, serous drain output, bilious duodenstomy output (as expected) Lab Results:   Recent Labs  12/02/14 0930 12/03/14 0400  WBC 2.2* 2.8*  HGB 7.9* 7.9*  HCT 23.7* 23.1*  PLT 63* 59*   BMET  Recent Labs  12/02/14 0300 12/03/14 0400  NA 140 138  K 3.5* 3.2*  CL 109 102  CO2 23 27  GLUCOSE 164* 155*  BUN 15 13  CREATININE 0.78 0.68  CALCIUM 7.7* 7.9*   PT/INR  Recent Labs  11/30/14 1830  LABPROT 16.5*  INR 1.32   Comprehensive Metabolic Panel:    Component Value Date/Time   NA 138 12/03/2014 0400   NA 140 12/02/2014 0300   NA 139 11/21/2014 1440   NA 139 11/15/2014 0912   K 3.2* 12/03/2014 0400   K 3.5* 12/02/2014 0300   K 3.8 11/21/2014 1440   K 3.1* 11/15/2014 0912   CL 102 12/03/2014 0400   CL 109 12/02/2014 0300   CO2 27 12/03/2014 0400   CO2 23 12/02/2014 0300   CO2 22 11/21/2014 1440   CO2 25 11/15/2014 0912   BUN 13 12/03/2014 0400   BUN 15 12/02/2014 0300   BUN 14.5 11/21/2014 1440   BUN 14.4 11/15/2014 0912   CREATININE 0.68 12/03/2014 0400   CREATININE 0.78 12/02/2014 0300   CREATININE 0.9 11/21/2014 1440   CREATININE 0.9 11/15/2014 0912   GLUCOSE 155* 12/03/2014 0400   GLUCOSE 164* 12/02/2014 0300   GLUCOSE 150*  11/21/2014 1440   GLUCOSE 122 11/15/2014 0912   CALCIUM 7.9* 12/03/2014 0400   CALCIUM 7.7* 12/02/2014 0300   CALCIUM 9.8 11/21/2014 1440   CALCIUM 9.3 11/15/2014 0912   AST 97* 11/30/2014 1830   AST 10 11/30/2014 0326   AST 15 11/21/2014 1440   AST 15 11/15/2014 0912   ALT 97* 11/30/2014 1830   ALT 9 11/30/2014 0326   ALT 18 11/21/2014 1440   ALT 19 11/15/2014 0912   ALKPHOS 47 11/30/2014 1830   ALKPHOS 41 11/30/2014 0326   ALKPHOS 95 11/21/2014 1440   ALKPHOS 92 11/15/2014 0912   BILITOT 1.8* 11/30/2014 1830   BILITOT 0.8 11/30/2014 0326   BILITOT 0.83 11/21/2014 1440   BILITOT 0.65 11/15/2014 0912   PROT 4.0* 11/30/2014 1830   PROT 4.1* 11/30/2014 0326   PROT 6.8 11/21/2014 1440   PROT 6.5 11/15/2014 0912   ALBUMIN 2.1* 11/30/2014 1830   ALBUMIN 2.1* 11/30/2014 0326   ALBUMIN 3.6 11/21/2014 1440   ALBUMIN 3.4* 11/15/2014 0912     Studies/Results: Dg Chest Port 1 View  12/03/2014   CLINICAL DATA:  Acute onset of respiratory distress. Personal history of smoking. Initial encounter.  EXAM: PORTABLE CHEST - 1 VIEW  COMPARISON:  Chest radiograph  performed 11/30/2014  FINDINGS: An enteric tube is seen extending below the diaphragm. A right IJ line is noted ending overlying the proximal to mid SVC.  The lungs are well-aerated. Right apical pleural parenchymal scarring is again noted. Increased interstitial markings are new from November, with mild right perihilar and left basilar airspace opacities. This could reflect mild pneumonia, given the patient's symptoms. There is no evidence of pleural effusion or pneumothorax.  The cardiomediastinal silhouette is within normal limits. No acute osseous abnormalities are seen.  IMPRESSION: Mild right perihilar and left basilar airspace opacities could reflect mild pneumonia, superimposed on the patient's chronic lung changes.   Electronically Signed   By: Garald Balding M.D.   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   Sakara Lehtinen J 12/03/2014

## 2014-12-03 NOTE — Progress Notes (Signed)
[facility] ADULT ICU REPLACEMENT PROTOCOL FOR AM LAB REPLACEMENT ONLY  The patient does apply for the [facility] Adult ICU Electrolyte Replacment Protocol based on the criteria listed below:   1. Is GFR >/= 40 ml/min? Yes.    Patient's GFR today is >90 2. Is urine output >/= 0.5 ml/kg/hr for the last 6 hours? Yes.   Patient's UOP is 0.6 ml/kg/hr 3. Is BUN < 60 mg/dL? Yes.    Patient's BUN today is 13 4. Abnormal electrolyte(s): K+3.2  Mg 1.7  Phos 2.3 5. Ordered replaced per protocol 6. If a panic level lab has been reported, has the CCM MD in charge been notified? No..   Physician:  Nicanor Bake Hilliard 12/03/2014 5:09 AM

## 2014-12-03 NOTE — Progress Notes (Addendum)
PULMONARY / CRITICAL CARE MEDICINE   Name: Daryl Vega MRN: [id number] DOB: 06-Jan-1946    ADMISSION DATE:  11/29/2014 CONSULTATION DATE:  11/29/2014  REFERRING MD :  Zella Richer  CHIEF COMPLAINT:  Post op vent management  INITIAL PRESENTATION: 68 y/o male admitted on 12/16 with a GI bleed.  He went for an endoscopy on 12/17 and was found to have a bleeding duodenal ulcer which rapidly progressed and he was taken to the OR on 12/17 emergently.  There he had an emergency exploratory laparotomy, oversewing of a posterior bleeding duodenal ulcer, pyloroplasty, cholecystectomy, repair of R hepatic artery, repair of cholecystoduodenal fistula and placement of a duodenostomy tube and feeding J-tube.  PCCM consulted for post op ventilator management.  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 pneumonia  SUBJECTIVE:  More hypoxemic, radiology calling pneumonia on CXR but patient denies cough or fever, denies dyspnea; he is not getting out of bed  VITAL SIGNS: Temp:  [98.2 F (36.8 C)-99.5 F (37.5 C)] 98.6 F (37 C) (12/20 0800) Pulse Rate:  [105-120] 112 (12/20 0900) Resp:  [10-23] 14 (12/20 0900) BP: (87-116)/(45-67) 105/52 mmHg (12/20 0900) SpO2:  [88 %-97 %] 91 % (12/20 0900) FiO2 (%):  [35 %-50 %] 35 % (12/20 0800) Weight:  [73.1 kg (161 lb 2.5 oz)] 73.1 kg (161 lb 2.5 oz) (12/20 0300) HEMODYNAMICS:   VENTILATOR SETTINGS: Vent Mode:  [-]  FiO2 (%):  [35 %-50 %] 35 % INTAKE / OUTPUT:  Intake/Output Summary (Last 24 hours) at 12/03/14 0940 Last data filed at 12/03/14 0909  Gross per 24 hour  Intake 1849.24 ml  Output   2880  ml  Net -1030.76 ml    PHYSICAL EXAMINATION: General:  Chronically ill appearing, no distress HEENT: NCAT, EOMi PULM: crackles R lung, otherwise clear CV: RRR, no mgr AB: BS+, duodenostomy tube, feeding J, JP drain all in place Ext: warm, no edema Neuro: A&Ox4, maew  LABS:  CBC  Recent Labs Lab 12/02/14 0300 12/02/14 0930 12/03/14 0400  WBC 2.5* 2.2* 2.8*  HGB 8.4* 7.9* 7.9*  HCT 25.3* 23.7* 23.1*  PLT 68* 63* 59*   Coag's  Recent Labs Lab 11/29/14 0644 11/30/14 1830  APTT 24  --   INR 1.21 1.32   BMET  Recent Labs Lab 12/01/14 1500 12/02/14 0300 12/03/14 0400  NA 141 140 138  K 3.4* 3.5* 3.2*  CL 112 109 102  CO2 17* 23 27  BUN 20 15 13   CREATININE 0.80 0.78 0.68  GLUCOSE 199* 164* 155*   Electrolytes  Recent Labs Lab 12/01/14 1500 12/02/14 0300 12/03/14 0400  CALCIUM 7.0* 7.7* 7.9*  MG 1.2* 1.8 1.7  PHOS 2.0* 1.9* 2.3   Sepsis Markers  Recent Labs Lab 11/30/14 2009 12/01/14 1500 12/02/14 0446  LATICACIDVEN 1.3 1.3 0.9   ABG  Recent Labs Lab 11/30/14 1755 12/01/14 0329 12/01/14 0910  PHART 7.261* 7.265* 7.341*  PCO2ART 31.1* 33.5* 27.6*  PO2ART 124.0* 162.0* 128.0*   Liver Enzymes  Recent Labs Lab 11/29/14 1554 11/30/14 0326 11/30/14 1830  AST 10 10 97*  ALT 11 9 97*  ALKPHOS 48 41 47  BILITOT 1.6* 0.8 1.8*  ALBUMIN 2.6*  2.1* 2.1*   Cardiac Enzymes  Recent Labs Lab 11/29/14 1554 11/29/14 2211  TROPONINI <0.30 <0.30   Glucose  Recent Labs Lab 12/02/14 1228 12/02/14 1653 12/02/14 2031 12/03/14 0040 12/03/14 0330 12/03/14 0813  GLUCAP 146* 139* 135* 146* 143* 142*    Imaging No results found.   ASSESSMENT / PLAN:  PULMONARY OETT 12/17 >> 12/18 A:  Acute respiratory failure with hypoxemia> ddx atelectasis vs pulm edema vs pneumonia; he is at risk for pneumonia given lung cancer, chemo, etc but he lacks fever or cough with mucus production, think CXR changes are chronic; favor atelectasis vs pulm  edema Stage IIIB Squamous Cell Lung cancer dx 07/2014, Rx carboplatin and AUC 08/2014  P:   See ID Diuresis (lasix x1) Pulm hygiene Strongly encouraged him to get OOB O2 as needed for O2 saturation > 90%  CARDIOVASCULAR CVL R IJ Cordis 12/17 >> A:  Hypertension P:  PRN labetalol Cont tele  Place peripheral IV, remove cordis  RENAL A:  Metabolic acidosis resolved P:   D/c bicarb gtt   GASTROINTESTINAL A:   Acute GI bleeding d/t Bleeding DU and 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 s/p undersewing of bleeding DU, repair of fistula via duodenostomy tube, jejunostomy 12/17 Protein calorie malnutrition P:   Npo Tube Feeding and Post op care per Gen surgery  HEMATOLOGIC A:   Massive GI bleed from duodenal ulcer  Thrombocytopenia after massive bleed P:  Trend CBC Transfuse for hgb < 7gm/dL PPI No blood thinners   INFECTIOUS A:  Concern for pneumonia (HCAP) 12/20, see my discussion above P:   Will treat empirically given high risk (leukopenia, hypoxemia, etc) but will plan to d/c abx quickly if he continues to not have fever, sputum production, etc  Blood culture 12/20 >  Vanc 12/20 > Zosyn 12/20 >  ENDOCRINE A:  DM2   P:   SSI  NEUROLOGIC A: Post op pain P: D/c IV gtts.  PRN analgesia  > changed to dilaudid 12/19   FAMILY  - Updates: Spoke wife bedside 12/20  Roselie Awkward, MD [place] [facility] Pager: [phone] Cell: [phone] If no response, call [phone]

## 2014-12-03 NOTE — Progress Notes (Signed)
ANTIBIOTIC CONSULT NOTE - INITIAL  Pharmacy Consult for Vancomycin and Zosyn Indication: HCAP  No Known Allergies  Patient Measurements: Height: 5\' 8"  (172.7 cm) Weight: 161 lb 2.5 oz (73.1 kg) IBW/kg (Calculated) : 68.4  Vital Signs: Temp: 98.6 F (37 C) (12/20 0800) Temp Source: Oral (12/20 0800) BP: 105/52 mmHg (12/20 0900) Pulse Rate: 112 (12/20 0900) Intake/Output from previous day: 12/19 0701 - 12/20 0700 In: 1820.4 [I.V.:697.1; NG/GT:820; IV Piggyback:303.3] Out: 2695 [Urine:1805; Emesis/NG output:450; Drains:440] Intake/Output from this shift: Total I/O In: 101.8 [I.V.:30; NG/GT:71.8] Out: 185 [Urine:185]  Labs:  Recent Labs  12/01/14 1500  12/02/14 0300 12/02/14 0930 12/03/14 0400  WBC 3.2*  < > 2.5* 2.2* 2.8*  HGB 8.5*  < > 8.4* 7.9* 7.9*  PLT 71*  < > 68* 63* 59*  CREATININE 0.80  --  0.78  --  0.68  < > = values in this interval not displayed. Estimated Creatinine Clearance: 86.7 mL/min (by C-G formula based on Cr of 0.68). No results for input(s): VANCOTROUGH, VANCOPEAK, VANCORANDOM, GENTTROUGH, GENTPEAK, GENTRANDOM, TOBRATROUGH, TOBRAPEAK, TOBRARND, AMIKACINPEAK, AMIKACINTROU, AMIKACIN in the last 72 hours.   Microbiology: Recent Results (from the past 720 hour(s))  MRSA PCR Screening     Status: None   Collection Time: 11/29/14  9:03 AM  Result Value Ref Range Status   MRSA by PCR NEGATIVE NEGATIVE Final    Comment:        The GeneXpert MRSA Assay (FDA approved for NASAL specimens only), is one component of a comprehensive MRSA colonization surveillance program. It is not intended to diagnose MRSA infection nor to guide or monitor treatment for MRSA infections.     Medical History: Past Medical History  Diagnosis Date  . DM (diabetes mellitus)   . Pneumonia   . Hypercholesteremia   . Melanoma   . Radiation 08/03/14-08/23/14    35 gray to right chest  . Lung cancer   . FH: chemotherapy     Medications:  Scheduled:  .  budesonide-formoterol  2 puff Inhalation BID  . chlorhexidine  15 mL Mouth Rinse BID  . insulin aspart  0-15 Units Subcutaneous 6 times per day  . pantoprazole (PROTONIX) IV  40 mg Intravenous Q12H  . sodium chloride  3 mL Intravenous Q12H  . sodium phosphate  Dextrose 5% IVPB  10 mmol Intravenous Once   Infusions:  . sodium chloride 10 mL/hr (12/03/14 0013)  . sodium chloride Stopped (12/02/14 0542)  . feeding supplement (GLUCERNA 1.2 CAL) 1,000 mL (12/03/14 0749)   PRN: HYDROcodone-acetaminophen, HYDROmorphone (DILAUDID) injection, labetalol, levalbuterol, ondansetron **OR** ondansetron (ZOFRAN) IV, prochlorperazine  Assessment: 68 y/o male with NSCLC, receiving chemo and XRT, admitted on 12/16 with a GI bleed. He went for an endoscopy on 12/17 and was found to have a bleeding duodenal ulcer which rapidly progressed and he was taken to the OR on 12/17 emergently. There he had an emergency exploratory laparotomy, oversewing of a posterior bleeding duodenal ulcer, pyloroplasty, cholecystectomy, repair of R hepatic artery, repair of cholecystoduodenal fistula and placement of a duodenostomy tube and feeding J-tube. He was extubated on 12/18. On 12/20, became more hypoxic, CXR shows nild right perihilar and left basilar airspace opacities could reflect mild pneumonia. Pharmacy consulted to dose vancomycin and zosyn for presumed HCAP.  12/20 >> Vancomycin >> 12/20 >> Zosyn >>  Tmax: 99.5 WBC: 2.8 (ANC 2.2) Renal: wnl, CrCl 86 CG, 90 N (UOP 1 ml/kg/hr)  No cultures ordered at present  Goal of Therapy:  Vancomycin  trough level 15-20 mcg/ml  Zosyn dose per indication, renal function  Plan:   Vancomycin 750mg  IV q8h Check trough at steady state Zosyn 3.375gm IV q8h (4hr extended infusions) Follow up renal function & cultures, clinical course  Peggyann Juba, PharmD, BCPS Pager: [phone] 12/03/2014,9:58 AM

## 2014-12-04 ENCOUNTER — Inpatient Hospital Stay (HOSPITAL_COMMUNITY): Payer: Medicare Other

## 2014-12-04 LAB — MAGNESIUM: Magnesium: 1.6 mg/dL (ref 1.5–2.5)

## 2014-12-04 LAB — GLUCOSE, CAPILLARY
GLUCOSE-CAPILLARY: 162 mg/dL — AB (ref 70–99)
GLUCOSE-CAPILLARY: 175 mg/dL — AB (ref 70–99)
Glucose-Capillary: 170 mg/dL — ABNORMAL HIGH (ref 70–99)
Glucose-Capillary: 120 mg/dL — ABNORMAL HIGH (ref 70–99)
Glucose-Capillary: 155 mg/dL — ABNORMAL HIGH (ref 70–99)
Glucose-Capillary: 164 mg/dL — ABNORMAL HIGH (ref 70–99)
Glucose-Capillary: 166 mg/dL — ABNORMAL HIGH (ref 70–99)
Glucose-Capillary: 148 mg/dL — ABNORMAL HIGH (ref 70–99)

## 2014-12-04 LAB — BASIC METABOLIC PANEL
ANION GAP: 14 (ref 5–15)
BUN: 16 mg/dL (ref 6–23)
CO2: 27 meq/L (ref 19–32)
Calcium: 8.5 mg/dL (ref 8.4–10.5)
Chloride: 99 mEq/L (ref 96–112)
Creatinine, Ser: 0.7 mg/dL (ref 0.50–1.35)
GFR calc non Af Amer: >90 mL/min (ref >90)
GLUCOSE: 127 mg/dL — AB (ref 70–99)
Potassium: 2.9 mEq/L — CL (ref 3.7–5.3)
Sodium: 140 mEq/L (ref 137–147)

## 2014-12-04 LAB — PHOSPHORUS: Phosphorus: 3.8 mg/dL (ref 2.3–4.6)

## 2014-12-04 LAB — CBC
HCT: 27.5 % — ABNORMAL LOW (ref 39.0–52.0)
HEMATOCRIT: 26.6 % — AB (ref 39.0–52.0)
HEMOGLOBIN: 8.6 g/dL — AB (ref 13.0–17.0)
Hemoglobin: 8.9 g/dL — ABNORMAL LOW (ref 13.0–17.0)
MCH: 29 pg (ref 26.0–34.0)
MCH: 29.2 pg (ref 26.0–34.0)
MCHC: 32.4 g/dL (ref 30.0–36.0)
MCHC: 32.3 g/dL (ref 30.0–36.0)
MCV: 89.6 fL (ref 78.0–100.0)
MCV: 90.2 fL (ref 78.0–100.0)
PLATELETS: 93 10*3/uL — AB (ref 150–400)
Platelets: 85 10*3/uL — ABNORMAL LOW (ref 150–400)
RBC: 3.07 MIL/uL — AB (ref 4.22–5.81)
RBC: 2.95 MIL/uL — ABNORMAL LOW (ref 4.22–5.81)
RDW: 18.5 % — ABNORMAL HIGH (ref 11.5–15.5)
RDW: 18.4 % — AB (ref 11.5–15.5)
WBC: 3 10*3/uL — ABNORMAL LOW (ref 4.0–10.5)
WBC: 3.6 10*3/uL — AB (ref 4.0–10.5)

## 2014-12-04 LAB — PROCALCITONIN: PROCALCITONIN: 0.37 ng/mL

## 2014-12-04 LAB — PROTIME-INR
INR: 1.42 (ref 0.00–1.49)
Prothrombin Time: 17.5 seconds — ABNORMAL HIGH (ref 11.6–15.2)

## 2014-12-04 LAB — APTT: aPTT: 35 seconds (ref 24–37)

## 2014-12-04 MED ORDER — POTASSIUM CHLORIDE 20 MEQ/15ML (10%) PO SOLN: 40.0000 meq | Freq: Once | ORAL | Status: DC

## 2014-12-04 MED ORDER — POTASSIUM CHLORIDE 10 MEQ/100ML IV SOLN
10.0000 meq | INTRAVENOUS | Status: AC
  Administered 2014-12-04 (×4): 10 meq via INTRAVENOUS
  Filled 2014-12-04 (×2): qty 100

## 2014-12-04 MED ORDER — SODIUM CHLORIDE 0.9 % IJ SOLN
10.0000 mL | Freq: Two times a day (BID) | INTRAMUSCULAR | Status: DC
  Administered 2014-12-04 – 2014-12-09 (×7): 10 mL

## 2014-12-04 MED ORDER — POTASSIUM CHLORIDE 20 MEQ/15ML (10%) PO SOLN
40.0000 meq | Freq: Two times a day (BID) | ORAL | Status: AC
  Administered 2014-12-04 (×2): 40 meq
  Filled 2014-12-04 (×2): qty 30

## 2014-12-04 MED ORDER — GLUCERNA 1.2 CAL PO LIQD
1000.0000 mL | ORAL | Status: DC
  Administered 2014-12-04: 1000 mL
  Filled 2014-12-04 (×2): qty 1000

## 2014-12-04 MED ORDER — SODIUM CHLORIDE 0.9 % IV BOLUS (SEPSIS)
1000.0000 mL | Freq: Once | INTRAVENOUS | Status: AC
  Administered 2014-12-04: 1000 mL via INTRAVENOUS

## 2014-12-04 MED ORDER — SODIUM CHLORIDE 0.9 % IJ SOLN
10.0000 mL | INTRAMUSCULAR | Status: DC | PRN
  Administered 2014-12-06 – 2014-12-11 (×5): 10 mL
  Administered 2014-12-12: 20 mL
  Administered 2014-12-13 – 2014-12-19 (×6): 10 mL
  Administered 2014-12-20: 20 mL
  Administered 2014-12-21: 10 mL
  Filled 2014-12-04 (×14): qty 40

## 2014-12-04 NOTE — Progress Notes (Signed)
Peripherally Inserted Central Catheter/Midline Placement  The IV Nurse has discussed with the patient and/or persons authorized to consent for the patient, the purpose of this procedure and the potential benefits and risks involved with this procedure.  The benefits include less needle sticks, lab draws from the catheter and patient may be discharged home with the catheter.  Risks include, but not limited to, infection, bleeding, blood clot (thrombus formation), and puncture of an artery; nerve damage and irregular heat beat.  Alternatives to this procedure were also discussed.  PICC/Midline Placement Documentation  PICC Triple Lumen 52/08/02 PICC Right Basilic 44 cm 0 cm (Active)  Indication for Insertion or Continuance of Line Prolonged intravenous therapies 12/04/2014  5:10 PM  Exposed Catheter (cm) 0 cm 12/04/2014  5:10 PM  Dressing Change Due 12/11/14 12/04/2014  5:10 PM       Biran Mayberry, Maricela Bo 12/04/2014, 5:11 PM

## 2014-12-04 NOTE — Progress Notes (Signed)
4 Days Post-Op  Subjective: Alert. very deconditioned. Feels bad, primarily due to dry mouth. Denies respiratory problems. Says abdominal pain not bad. Dark bloody stool per RN Duodenostomy tube draining gastroduodenal bilious contents. Right upper quadrant JP drain serosanguineous. J-tube feeds being tolerated. Currently not on pharmacologic DVT prophylaxis because of blood in stool.    Objective: Vital signs in last 24 hours: Temp:  [98.1 F (36.7 C)-99 F (37.2 C)] 98.3 F (36.8 C) (12/21 0400) Pulse Rate:  [106-120] 117 (12/21 0700) Resp:  [0-31] 14 (12/21 0700) BP: (86-109)/(52-79) 93/63 mmHg (12/21 0700) SpO2:  [91 %-99 %] 98 % (12/21 0700) FiO2 (%):  [30 %-40 %] 40 % (12/20 1230) Weight:  [160 lb 4.4 oz (72.7 kg)] 160 lb 4.4 oz (72.7 kg) (12/21 0400) Last BM Date: 11/30/14  Intake/Output from previous day: 12/20 0701 - 12/21 0700 In: 1881.8 [P.O.:150; I.V.:270; NG/GT:911.8; IV Piggyback:550] Out: 2800 [WUJWJ:1914; Emesis/NG output:700; Drains:805] Intake/Output this shift:    General appearance: Alert. Markedly deconditioned. Feeble. Answers questions appropriately. Skin warm and dry. Resp: clear to auscultation bilaterally GI: Abdomen soft. Nontender. Midline wound closed with staples and looks clean. Billy S drainage from duodenostomy tube. Serosanguineous drainage from right upper quadrant JP drain. Jejunostomy feeding tube left upper quadrant looks good.  Lab Results:  Results for orders placed or performed during the hospital encounter of 11/29/14 (from the past 24 hour(s))  Glucose, capillary     Status: Abnormal   Collection Time: 12/03/14  8:13 AM  Result Value Ref Range   Glucose-Capillary 142 (H) 70 - 99 mg/dL   Comment 1 Documented in Chart    Comment 2 Notify RN   Procalcitonin - Baseline     Status: None   Collection Time: 12/03/14 10:15 AM  Result Value Ref Range   Procalcitonin 0.43 ng/mL  Glucose, capillary     Status: Abnormal   Collection Time:  12/03/14 12:37 PM  Result Value Ref Range   Glucose-Capillary 164 (H) 70 - 99 mg/dL   Comment 1 Documented in Chart    Comment 2 Notify RN   Glucose, capillary     Status: Abnormal   Collection Time: 12/03/14  3:09 PM  Result Value Ref Range   Glucose-Capillary 148 (H) 70 - 99 mg/dL   Comment 1 Documented in Chart    Comment 2 Notify RN   Glucose, capillary     Status: Abnormal   Collection Time: 12/03/14  7:25 PM  Result Value Ref Range   Glucose-Capillary 139 (H) 70 - 99 mg/dL   Comment 1 Documented in Chart    Comment 2 Notify RN   Glucose, capillary     Status: Abnormal   Collection Time: 12/03/14 11:35 PM  Result Value Ref Range   Glucose-Capillary 175 (H) 70 - 99 mg/dL  Magnesium     Status: None   Collection Time: 12/04/14  3:46 AM  Result Value Ref Range   Magnesium 1.6 1.5 - 2.5 mg/dL  Phosphorus     Status: None   Collection Time: 12/04/14  3:46 AM  Result Value Ref Range   Phosphorus 3.8 2.3 - 4.6 mg/dL  Procalcitonin     Status: None   Collection Time: 12/04/14  3:46 AM  Result Value Ref Range   Procalcitonin 0.37 ng/mL  Glucose, capillary     Status: Abnormal   Collection Time: 12/04/14  4:44 AM  Result Value Ref Range   Glucose-Capillary 162 (H) 70 - 99 mg/dL  Studies/Results: Dg Chest Port 1 View  12/03/2014   CLINICAL DATA:  Acute onset of respiratory distress. Personal history of smoking. Initial encounter.  EXAM: PORTABLE CHEST - 1 VIEW  COMPARISON:  Chest radiograph performed 11/30/2014  FINDINGS: An enteric tube is seen extending below the diaphragm. A right IJ line is noted ending overlying the proximal to mid SVC.  The lungs are well-aerated. Right apical pleural parenchymal scarring is again noted. Increased interstitial markings are new from November, with mild right perihilar and left basilar airspace opacities. This could reflect mild pneumonia, given the patient's symptoms. There is no evidence of pleural effusion or pneumothorax.  The  cardiomediastinal silhouette is within normal limits. No acute osseous abnormalities are seen.  IMPRESSION: Mild right perihilar and left basilar airspace opacities could reflect mild pneumonia, superimposed on the patient's chronic lung changes.   Electronically Signed   By: Garald Balding M.D.   On: 12/03/2014 02:58    . budesonide-formoterol  2 puff Inhalation BID  . chlorhexidine  15 mL Mouth Rinse BID  . insulin aspart  0-15 Units Subcutaneous 6 times per day  . pantoprazole (PROTONIX) IV  40 mg Intravenous Q12H  . piperacillin-tazobactam (ZOSYN)  IV  3.375 g Intravenous Q8H  . sodium chloride  3 mL Intravenous Q12H  . vancomycin  750 mg Intravenous Q8H     Assessment/Plan: s/p Procedure(s): ESOPHAGOGASTRODUODENOSCOPY (EGD)  POD #4.  Oversew bleeding duodenal ulcer, pyloroplasty, cholecystectomy, repair of fistula with duodenostomy tube. Stable.   will need NG and all tubes for some time  will begin TNA today   PT consult  Hematochezia. Not sure whether this is old blood or new bleeding. Hemodynamics stable Check lab stat. Remains in ICU.  DVT prophylaxis. Holding pharmacologic interventions due to blood in stool.  @PROBHOSP @  LOS: 5 days    Daryl Vega M 12/04/2014  . .prob

## 2014-12-04 NOTE — Progress Notes (Addendum)
NUTRITION FOLLOW UP  Intervention:    As warranted per surgery:  Increase Glucerna 1.2 by 10 ml every 4 hours to goal rate of 65 ml/hr.   D/C Prostat daily.    Tube feeding goal regimen provides 1872 kcal (100% of needs), 94 grams of protein, and 1264 ml of H2O.   Nutrition Dx:   Inadequate oral intake related to nausea/taste changes as evidenced by PO intake < 75%, 50 lb weight loss; ongoing, now r/t to NPO status; ongoing  Goal:   TF to meet >/= 90% of their estimated nutrition needs; not met    Monitor:   TF tolerance, total protein/energy intake, labs, weight, GI profile  Assessment:   12/17:  -Pt reported an unintentional wt loss of ~50 lbs since beginning of chemo and radiation treatments, approximately 8 months ago (25% body weight loss, severe for time frame) -Endorsed ongoing nausea and taste changes that have inhibited appetite. Pt consumes one Boost daily, and minimal intake of meals or snacks. Enjoys sweets and snacks vs meals -RD encouraged use of anti-emetics, as well as implementing use of oral rinses to assist with taste changes -Has been evaluated by outpatient oncology RD in 08/2014 for persistent nausea and vomiting. Was educated and provided nutrition handouts/resources. Has experienced ~10 lb weight loss since outpatient appointment. -Pt NPO for EGD d/t possible GI bleed. -RD to order Boost once daily + snacks w/diet advancement. Pt willing to consume MagicCup and pudding inbetween meals  12/18: -Per MD note, pt with bleeding duodenal bulb ulcer and fistula; duodenostomy tube and Jtube was placed -Glucerna 1.2 at 20 ml/hr infusing at 20 ml/hr per surgery recommendations. Providing 576 kcal, 15 gram protein, 386 ml free water -CBG elevated -Day 1 post op.Patient is currently intubated on ventilator support MV: 10.8 L/min Temp (24hrs), Avg:98.5 F (36.9 C), Min:98.1 F (36.7 C), Max:99 F (37.2 C)  12/21: - Pt extubated 12/19. Tolerating TF at 40 ml/hr.  TF advanced this am per surgery. Spoke with RN. Residuals unable to be checked due to collapse of tube upon attempt. Coffee ground material draining from NG tube, does not look like TF. Bowel movements are loose and black.   Current TF is Glucerna 1.2 @ 40 ml/hr providing 1152 kcal (64% of estimated needs) and 58 g protein (68% of estimated needs).   Labs: Na WNL K low BUN WNL Mg and Phos WNL  Height: Ht Readings from Last 1 Encounters:  11/29/14 $RemoveB'5\' 8"'[id number]$  (1.727 m)    Weight Status:   Wt Readings from Last 1 Encounters:  12/04/14 160 lb 4.4 oz (72.7 kg)    Re-estimated needs:  Kcal: 1800-2000 Protein: 85-100 gram Fluid: >/=2100 ml daily  Skin: drains in right and left abd  Diet Order: Diet NPO time specified Except for: Ice Chips   Intake/Output Summary (Last 24 hours) at 12/04/14 1132 Last data filed at 12/04/14 1015  Gross per 24 hour  Intake   1790 ml  Output   2850 ml  Net  -1060 ml    Last BM: 12/21   Labs:   Recent Labs Lab 12/02/14 0300 12/03/14 0400 12/04/14 0346 12/04/14 0744  NA 140 138  --  140  K 3.5* 3.2*  --  2.9*  CL 109 102  --  99  CO2 23 27  --  27  BUN 15 13  --  16  CREATININE 0.78 0.68  --  0.70  CALCIUM 7.7* 7.9*  --  8.5  MG 1.8  1.7 1.6  --   PHOS 1.9* 2.3 3.8  --   GLUCOSE 164* 155*  --  127*    CBG (last 3)   Recent Labs  12/03/14 2335 12/04/14 0444 12/04/14 0735  GLUCAP 175* 162* 120*    Scheduled Meds: . budesonide-formoterol  2 puff Inhalation BID  . chlorhexidine  15 mL Mouth Rinse BID  . insulin aspart  0-15 Units Subcutaneous 6 times per day  . pantoprazole (PROTONIX) IV  40 mg Intravenous Q12H  . piperacillin-tazobactam (ZOSYN)  IV  3.375 g Intravenous Q8H  . potassium chloride  10 mEq Intravenous Q1 Hr x 4  . potassium chloride  40 mEq Per Tube BID  . sodium chloride  3 mL Intravenous Q12H  . vancomycin  750 mg Intravenous Q8H    Continuous Infusions: . sodium chloride 10 mL/hr at 12/04/14 0400  .  sodium chloride 10 mL/hr at 12/04/14 0300  . feeding supplement (GLUCERNA 1.2 CAL)      Laurette Schimke MS, RD, LDN

## 2014-12-04 NOTE — Progress Notes (Signed)
5 Days Post-Op  Subjective: Pt uncomfortable.  Sleepy, barely stays awake to answer my questions.  Pain in abdomen, c/o some nausea, no vomiting.  NG tube putting out 323mL/24hr but is bloody.  D tube with good drainage, JP drain with serosanguinous.  Tolerating tube feedings, but not yet at goal (goal is 42mL/hr)  Objective: Vital signs in last 24 hours: Temp:  [98.1 F (36.7 C)-99.3 F (37.4 C)] 99.2 F (37.3 C) (12/22 0000) Pulse Rate:  [113-125] 120 (12/21 1900) Resp:  [11-30] 15 (12/22 0554) BP: (85-136)/(52-72) 91/56 mmHg (12/22 0554) SpO2:  [97 %-100 %] 99 % (12/22 0554) Weight:  [157 lb 6.5 oz (71.4 kg)] 157 lb 6.5 oz (71.4 kg) (12/22 0400) Last BM Date: 12/05/14  Intake/Output from previous day: 12/21 0701 - 12/22 0700 In: 3841.3 [P.O.:270; I.V.:280; NG/GT:1281.3; IV CHENIDPOE:4235] Out: 3614 [Urine:1295; Emesis/NG output:375; Drains:1785; Stool:800] Intake/Output this shift:    General: pleasant, WD, malnourished white male who is laying in bed in NAD HEENT: head is normocephalic, atraumatic.  Sclera are noninjected.  PERRL.  Ears and nose without any masses or lesions.  Mouth is pink but dry Heart: regular, rate, and rhythm.  Normal s1,s2. No obvious murmurs, gallops, or rubs noted.  Palpable radial and pedal pulses bilaterally Lungs: CTAB, no wheezes, rhonchi, or rales noted.  Respiratory effort nonlabored, good effort. Abd: very thin, soft, mildly tender, ND, +BS, midline incision site clean with staples intact, JP drain with serosanguinous drainage, D tube with stool matter like output, J tube in LUQ Ext:  No tenderness to calfs Psych: A&Ox3 with an appropriate affect.   Lab Results:   Recent Labs  12/05/14 12/05/14 0356  WBC 4.7 4.1  HGB 8.4* 8.2*  HCT 26.5* 26.1*  PLT 99* 101*    BMET  Recent Labs  12/04/14 0744 12/05/14 0356  NA 140 139  K 2.9* 2.8*  CL 99 104  CO2 27 28  GLUCOSE 127* 164*  BUN 16 15  CREATININE 0.70 0.72  CALCIUM 8.5 7.7*    PT/INR  Recent Labs  12/04/14 0744  LABPROT 17.5*  INR 1.42     Recent Labs Lab 11/29/14 0644 11/29/14 1554 11/30/14 0326 11/30/14 1830  AST 10 10 10  97*  ALT 11 11 9  97*  ALKPHOS 58 48 41 47  BILITOT 0.6 1.6* 0.8 1.8*  PROT 5.6* 4.9* 4.1* 4.0*  ALBUMIN 2.9* 2.6* 2.1* 2.1*     Lipase     Component Value Date/Time   LIPASE 41 11/29/2014 0644     Studies/Results: Dg Chest Port 1 View  12/05/2014   CLINICAL DATA:  Lung atelectatic change  EXAM: PORTABLE CHEST - 1 VIEW  COMPARISON:  December 04, 2014  FINDINGS: Tube and catheter positions are unchanged without pneumothorax. There is patchy infiltrate with volume loss right upper lobe. There is also patchy infiltrate left lower lobe. No new opacity. Heart is normal in size and contour. Pulmonary vascular is normal. No adenopathy.  IMPRESSION: Right upper lobe and left lower lobe infiltrate. Volume loss right upper lobe, stable. No change in cardiac silhouette. Tube and catheter positions unchanged. No pneumothorax.   Electronically Signed   By: Lowella Grip M.D.   On: 12/05/2014 07:16   Dg Chest Port 1 View  12/04/2014   CLINICAL DATA:  Central catheter placement  EXAM: PORTABLE CHEST - 1 VIEW  COMPARISON:  December 03, 2014  FINDINGS: Central catheter tip is in the right atrium, slightly beyond the cavoatrial junction. Nasogastric tube  tip and side port are below the diaphragm. Cordis has been removed from the right side. No pneumothorax. There is infiltrate and volume loss the right upper lobe, stable. There is focal infiltrate in the left lower lobe is well. Heart size and pulmonary vascularity are normal. No adenopathy.  IMPRESSION: Tube and catheter positions as described without pneumothorax. Volume loss with patchy infiltrate right upper lobe. Focal area of infiltrate left base as well.   Electronically Signed   By: Lowella Grip M.D.   On: 12/04/2014 19:01    Medications: . budesonide-formoterol  2 puff  Inhalation BID  . chlorhexidine  15 mL Mouth Rinse BID  . insulin aspart  0-15 Units Subcutaneous 6 times per day  . pantoprazole (PROTONIX) IV  40 mg Intravenous Q12H  . piperacillin-tazobactam (ZOSYN)  IV  3.375 g Intravenous Q8H  . potassium chloride  40 mEq Per Tube BID  . sodium chloride  10-40 mL Intracatheter Q12H  . sodium chloride  3 mL Intravenous Q12H  . vancomycin  750 mg Intravenous Q8H    Assessment/Plan 1.  Bleeding duodenal ulcer with cholecystoduodenal fistula -EGD for control of bleeding 11/30/16 [name] -POD #5 Emergency Ex Lap, oversewing of posterior bleeding DU, Heineke Mikulicz pyloroplasty, cholecystectomy, repair of right hepatic artery, repair of cholecystoduodenal fistula and placement of duodenostomy tube, feeding jejunostomy. 11/30/14, Dr. Jackolyn Confer 2.  Non small cell lung carcinoma, Stage IIIB (T3, N3, M0) undergoing chemotherapy and radiation therapy. (07/2014) 3.  AODM 4.  Hx of Melanoma 5.  Dyslipidemia 6.  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  Hypokalemia - 2.8 & Hypomagnesemia 1.4 - ordered K+ supplements, repeat labs after supplements have been given 8.  Hematochezia 9.  C.Diff positive - start liquid Flagyl  Plan: 1.  D/c NG tube, continue NPO and tube feeding 2.  Supplement K and Mg 3.  Ambulate and IS 4.  SCD's and dvt proph being held due to bleeding 5.  Increase TF rate to goal of 35mL/hr 6.  C. Diff positive - start oral flagyl per J tube 500mg  BID     LOS: 6 days    [place], [facility] 12/05/2014

## 2014-12-04 NOTE — Progress Notes (Signed)
PULMONARY / CRITICAL CARE MEDICINE   Name: Daryl Vega MRN: [id number] DOB: September 20, 1946    ADMISSION DATE:  11/29/2014 CONSULTATION DATE:  11/29/2014  REFERRING MD :  Zella Richer  CHIEF COMPLAINT:  Post op vent management  INITIAL PRESENTATION: 68 y/o male admitted on 12/16 with a GI bleed.  He went for an endoscopy on 12/17 and was found to have a bleeding duodenal ulcer which rapidly progressed and he was taken to the OR on 12/17 emergently.  There he had an emergency exploratory laparotomy, oversewing of a posterior bleeding duodenal ulcer, pyloroplasty, cholecystectomy, repair of R hepatic artery, repair of cholecystoduodenal fistula and placement of a duodenostomy tube and feeding J-tube.  PCCM consulted for post op ventilator management.  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 pneumonia  SUBJECTIVE:  RN reports 2+ dark stools and one bright red blood this am / overnight. Coffee ground material draining from NGT.  Low potassium.     VITAL SIGNS: Temp:  [98.1 F (36.7 C)-99 F (37.2 C)] 98.1 F (36.7 C) (12/21 0800) Pulse Rate:  [106-120] 120 (12/21 0800) Resp:  [0-31] 13 (12/21 0800) BP: (86-109)/(59-79) 101/64 mmHg (12/21 0800) SpO2:  [91 %-99 %] 98 % (12/21 0800) FiO2 (%):  [30 %-40 %] 40 % (12/20 1230) Weight:  [160 lb 4.4 oz (72.7 kg)] 160 lb 4.4 oz (72.7 kg) (12/21 0400)   HEMODYNAMICS:     VENTILATOR SETTINGS: Vent Mode:  [-]  FiO2 (%):  [30 %-40 %] 40 %   INTAKE / OUTPUT:  Intake/Output Summary (Last 24 hours) at 12/04/14 0939 Last data filed at 12/04/14 0800  Gross per 24 hour  Intake   1880 ml   Output   2615 ml  Net   -735 ml    PHYSICAL EXAMINATION: General:  Chronically ill appearing, no distress HEENT: NCAT, EOMi PULM:  Even/non-labored, lungs diminished but clear bilaterally  CV: RRR, no mgr AB: BS+, duodenostomy tube, feeding J, JP drain all in place Ext: warm, no edema Neuro: A&Ox4, maew  LABS:  CBC  Recent Labs Lab 12/02/14 0930 12/03/14 0400 12/04/14 0744  WBC 2.2* 2.8* 3.0*  HGB 7.9* 7.9* 8.9*  HCT 23.7* 23.1* 27.5*  PLT 63* 59* 93*   Coag's  Recent Labs Lab 11/29/14 0644 11/30/14 1830 12/04/14 0744  APTT 24  --  35  INR 1.21 1.32 1.42   BMET  Recent Labs Lab 12/02/14 0300 12/03/14 0400 12/04/14 0744  NA 140 138 140  K 3.5* 3.2* 2.9*  CL 109 102 99  CO2 23 27 27   BUN 15 13 16   CREATININE 0.78 0.68 0.70  GLUCOSE 164* 155* 127*   Electrolytes  Recent Labs Lab 12/02/14 0300 12/03/14 0400 12/04/14 0346 12/04/14 0744  CALCIUM 7.7* 7.9*  --  8.5  MG 1.8 1.7 1.6  --   PHOS 1.9* 2.3 3.8  --    Sepsis Markers  Recent Labs Lab 11/30/14 2009 12/01/14 1500 12/02/14 0446 12/03/14 1015 12/04/14 0346  LATICACIDVEN 1.3 1.3 0.9  --   --   PROCALCITON  --   --   --  0.43 0.37   ABG  Recent  Labs Lab 11/30/14 1755 12/01/14 0329 12/01/14 0910  PHART 7.261* 7.265* 7.341*  PCO2ART 31.1* 33.5* 27.6*  PO2ART 124.0* 162.0* 128.0*   Liver Enzymes  Recent Labs Lab 11/29/14 1554 11/30/14 0326 11/30/14 1830  AST 10 10 97*  ALT 11 9 97*  ALKPHOS 48 41 47  BILITOT 1.6* 0.8 1.8*  ALBUMIN 2.6* 2.1* 2.1*   Cardiac Enzymes  Recent Labs Lab 11/29/14 1554 11/29/14 2211  TROPONINI <0.30 <0.30   Glucose  Recent Labs Lab 12/03/14 1237 12/03/14 1509 12/03/14 1925 12/03/14 2335 12/04/14 0444 12/04/14 0735  GLUCAP 164* 148* 139* 175* 162* 120*    Imaging Dg Chest Port 1 View  12/03/2014   CLINICAL DATA:  Acute onset of respiratory distress. Personal history of smoking. Initial encounter.  EXAM: PORTABLE CHEST - 1  VIEW  COMPARISON:  Chest radiograph performed 11/30/2014  FINDINGS: An enteric tube is seen extending below the diaphragm. A right IJ line is noted ending overlying the proximal to mid SVC.  The lungs are well-aerated. Right apical pleural parenchymal scarring is again noted. Increased interstitial markings are new from November, with mild right perihilar and left basilar airspace opacities. This could reflect mild pneumonia, given the patient's symptoms. There is no evidence of pleural effusion or pneumothorax.  The cardiomediastinal silhouette is within normal limits. No acute osseous abnormalities are seen.  IMPRESSION: Mild right perihilar and left basilar airspace opacities could reflect mild pneumonia, superimposed on the patient's chronic lung changes.   Electronically Signed   By: Garald Balding M.D.   On: 12/03/2014 02:58     ASSESSMENT / PLAN:  PULMONARY OETT 12/17 >> 12/18 A:  Acute respiratory failure with hypoxemia - ddx atelectasis vs pulm edema vs pneumonia; he is at risk for pneumonia given lung cancer, chemo, etc but he lacks fever or cough with mucus production, think CXR changes are chronic; favor atelectasis vs pulm edema.   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 s/p undersewing of bleeding DU, repair of fistula via duodenostomy tube, jejunostomy 12/17 Protein calorie malnutrition P:    NPO x ice chips Tube Feeding and Post op care per Gen surgery Hold off on TNA / PICC line for now Advance TF per Nutrition  HEMATOLOGIC A:   Massive GI bleed from duodenal ulcer  Acute Blood Loss Anemia  Thrombocytopenia - after massive GI bleed P:  Trend CBC Transfuse for hgb < 7gm/dL PPI No blood thinners  Follow up CBC @ 1600 given multiple stools  INFECTIOUS A:   Concern for pneumonia (HCAP) - 12/20, see my discussion above P:   Treat empirically given high risk (leukopenia, hypoxemia, etc) but will plan to d/c abx quickly if he continues to not have fever, sputum production, etc Follow PCT, essentially negative thus far  Blood culture 12/20 >  Vanc 12/20 > Zosyn 12/20 >  ENDOCRINE A:   DM2   P:   SSI  NEUROLOGIC A:  Pain - Post op abd surgery Baseline Benzo Dependent - temazepam QHS + lorazepam PRN P: PRN analgesia  > changed to dilaudid 12/19   FAMILY  - Updates: No family available at bedside 12/21.     Noe Gens, NP-C [facility] Pgr: [phone] or [phone]    Attending:  I have seen and examined the patient with nurse practitioner/resident and agree with the note above.   His lungs are more clear today and he is oxygenating better.  I'm worried he is re-bleeding based on the blood in the NG and the bloody stool.  Will check stat CBC, place PICC in case pressors are needed.  Replace potassium today.  Also, I discussed code status with he and his son today as he has been slow to participate in his care and has lung cancer.  While I don't feel that this overall one year prognosis is grim, I want to make sure that he wants aggressive care as he has not been very willing to participate in therapy as straight forward as PT.  At this point he desires full code but will talk about it with his family.  Condition guarded.  Roselie Awkward, MD [place] [facility] Pager: [phone] Cell: [phone] If no response, call [phone]

## 2014-12-05 ENCOUNTER — Inpatient Hospital Stay (HOSPITAL_COMMUNITY): Payer: Medicare Other

## 2014-12-05 DIAGNOSIS — A047 Enterocolitis due to Clostridium difficile: Secondary | ICD-10-CM

## 2014-12-05 LAB — CLOSTRIDIUM DIFFICILE BY PCR: CDIFFPCR: POSITIVE — AB

## 2014-12-05 LAB — BASIC METABOLIC PANEL
Anion gap: 7 (ref 5–15)
BUN: 15 mg/dL (ref 6–23)
CHLORIDE: 104 meq/L (ref 96–112)
CO2: 28 mmol/L (ref 19–32)
Calcium: 7.7 mg/dL — ABNORMAL LOW (ref 8.4–10.5)
Creatinine, Ser: 0.72 mg/dL (ref 0.50–1.35)
GFR calc Af Amer: >90 mL/min (ref >90)
GFR calc non Af Amer: >90 mL/min (ref >90)
GLUCOSE: 164 mg/dL — AB (ref 70–99)
Potassium: 2.8 mmol/L — ABNORMAL LOW (ref 3.5–5.1)
Sodium: 139 mmol/L (ref 135–145)

## 2014-12-05 LAB — CBC
HEMATOCRIT: 26.5 % — AB (ref 39.0–52.0)
HEMATOCRIT: 26.1 % — AB (ref 39.0–52.0)
HEMOGLOBIN: 8.4 g/dL — AB (ref 13.0–17.0)
Hemoglobin: 8.2 g/dL — ABNORMAL LOW (ref 13.0–17.0)
MCH: 28.6 pg (ref 26.0–34.0)
MCH: 28.8 pg (ref 26.0–34.0)
MCHC: 31.4 g/dL (ref 30.0–36.0)
MCHC: 31.7 g/dL (ref 30.0–36.0)
MCV: 90.8 fL (ref 78.0–100.0)
MCV: 90.9 fL (ref 78.0–100.0)
Platelets: 99 10*3/uL — ABNORMAL LOW (ref 150–400)
Platelets: 101 10*3/uL — ABNORMAL LOW (ref 150–400)
RBC: 2.92 MIL/uL — ABNORMAL LOW (ref 4.22–5.81)
RBC: 2.87 MIL/uL — AB (ref 4.22–5.81)
RDW: 18.1 % — ABNORMAL HIGH (ref 11.5–15.5)
RDW: 18.1 % — ABNORMAL HIGH (ref 11.5–15.5)
WBC: 4.7 10*3/uL (ref 4.0–10.5)
WBC: 4.1 10*3/uL (ref 4.0–10.5)

## 2014-12-05 LAB — GLUCOSE, CAPILLARY
GLUCOSE-CAPILLARY: 161 mg/dL — AB (ref 70–99)
GLUCOSE-CAPILLARY: 163 mg/dL — AB (ref 70–99)
GLUCOSE-CAPILLARY: 169 mg/dL — AB (ref 70–99)
Glucose-Capillary: 183 mg/dL — ABNORMAL HIGH (ref 70–99)
Glucose-Capillary: 169 mg/dL — ABNORMAL HIGH (ref 70–99)
Glucose-Capillary: 169 mg/dL — ABNORMAL HIGH (ref 70–99)

## 2014-12-05 LAB — PROCALCITONIN: PROCALCITONIN: 0.38 ng/mL

## 2014-12-05 LAB — PHOSPHORUS: Phosphorus: 3.1 mg/dL (ref 2.3–4.6)

## 2014-12-05 LAB — MAGNESIUM: Magnesium: 1.4 mg/dL — ABNORMAL LOW (ref 1.5–2.5)

## 2014-12-05 LAB — POTASSIUM: POTASSIUM: 3.2 mmol/L — AB (ref 3.5–5.1)

## 2014-12-05 MED ORDER — MAGNESIUM SULFATE 2 GM/50ML IV SOLN
2.0000 g | Freq: Once | INTRAVENOUS | Status: AC
Start: 2014-12-05 — End: 2014-12-05
  Administered 2014-12-05: 2 g via INTRAVENOUS
  Filled 2014-12-05: qty 50

## 2014-12-05 MED ORDER — METRONIDAZOLE 50 MG/ML ORAL SUSPENSION
500.0000 mg | Freq: Three times a day (TID) | ORAL | Status: DC
  Administered 2014-12-05 (×3): 500 mg
  Filled 2014-12-05 (×6): qty 10

## 2014-12-05 MED ORDER — GLUCERNA 1.2 CAL PO LIQD
1000.0000 mL | ORAL | Status: DC
  Administered 2014-12-05 – 2014-12-13 (×12): 1000 mL
  Filled 2014-12-05 (×15): qty 1000

## 2014-12-05 MED ORDER — HYDROMORPHONE HCL 1 MG/ML IJ SOLN
0.5000 mg | INTRAMUSCULAR | Status: DC | PRN
Start: 2014-12-05 — End: 2014-12-06
  Administered 2014-12-05 (×5): 0.5 mg via INTRAVENOUS
  Administered 2014-12-06: 1 mg via INTRAVENOUS
  Filled 2014-12-05 (×6): qty 1

## 2014-12-05 MED ORDER — POTASSIUM CHLORIDE 10 MEQ/100ML IV SOLN
10.0000 meq | INTRAVENOUS | Status: AC
  Administered 2014-12-05 (×5): 10 meq via INTRAVENOUS
  Filled 2014-12-05 (×5): qty 100

## 2014-12-05 MED ORDER — POTASSIUM CHLORIDE 20 MEQ/15ML (10%) PO SOLN
40.0000 meq | Freq: Two times a day (BID) | ORAL | Status: DC
  Administered 2014-12-05 (×2): 40 meq
  Filled 2014-12-05 (×2): qty 30

## 2014-12-05 NOTE — Evaluation (Signed)
Physical Therapy Evaluation Patient Details Name: Daryl Vega MRN: [id number] DOB: [birth year] Today's Date: 12/05/2014   History of Present Illness    68 y/o male admitted on 12/16 with a GI bleed. He went for an endoscopy on 12/17 and was found to have a bleeding duodenal ulcer which rapidly progressed and he was taken to the OR on 12/17 emergently. There he had an emergency exploratory laparotomy, oversewing of a posterior bleeding duodenal ulcer, pyloroplasty, cholecystectomy, repair of R hepatic artery, repair of cholecystoduodenal fistula and placement of a duodenostomy tube and feeding J-tube  Clinical Impression  Pt cooperative but ltd by pain with movement and attempts to sit as well as by fatigue and elevated HR (132bpm).  Pt currently requires assist of 2 for all mobility tasks and would benefit from follow up rehab at SNF level to maximize safety and IND prior to return home.    Follow Up Recommendations SNF    Equipment Recommendations  Rolling walker with 5" wheels    Recommendations for Other Services OT consult     Precautions / Restrictions Precautions Precautions: Fall Precaution Comments: Multiple drains, tubes and catheters Restrictions Weight Bearing Restrictions: No Other Position/Activity Restrictions: WBAT      Mobility  Bed Mobility Overal bed mobility: Needs Assistance;+2 for physical assistance Bed Mobility: Supine to Sit;Sit to Supine     Supine to sit: Mod assist;+2 for physical assistance;+2 for safety/equipment Sit to supine: Mod assist;+2 for physical assistance;+2 for safety/equipment   General bed mobility comments: cues for log roll sequencing side ly to sit  Transfers Overall transfer level: Needs assistance Equipment used: Rolling walker (2 wheeled) Transfers: Sit to/from Stand Sit to Stand: Mod assist         General transfer comment: cues for transition position and use of UEs to self  assist  Ambulation/Gait Ambulation/Gait assistance: Min assist;Mod assist;+2 physical assistance;+2 safety/equipment Ambulation Distance (Feet): 8 Feet (and 4) Assistive device: Rolling walker (2 wheeled) Gait Pattern/deviations: Step-to pattern;Step-through pattern;Decreased step length - left;Decreased step length - right;Shuffle;Trunk flexed Gait velocity: decr   General Gait Details: cues for posture and position from RW - ltd by HR elevated to 132  Stairs            Wheelchair Mobility    Modified Rankin (Stroke Patients Only)       Balance                                             Pertinent Vitals/Pain Pain Assessment: 0-10 Pain Score: 7  Pain Location: buttocks Pain Descriptors / Indicators: Sore Pain Intervention(s): Limited activity within patient's tolerance;Monitored during session;Premedicated before session;Patient requesting pain meds-RN notified    Home Living Family/patient expects to be discharged to:: Private residence Living Arrangements: Spouse/significant other Available Help at Discharge: Family Type of Home: Mobile home Home Access: Stairs to enter Entrance Stairs-Rails: Right Entrance Stairs-Number of Steps: 4 Home Layout: One level Home Equipment: Centre - single point;Wheelchair - manual      Prior Function Level of Independence: Needs assistance         Comments: Pt reports hx of falls at home     Hand Dominance   Dominant Hand: Left    Extremity/Trunk Assessment   Upper Extremity Assessment: Generalized weakness           Lower Extremity Assessment: Generalized weakness  Cervical / Trunk Assessment: Normal  Communication   Communication: HOH  Cognition Arousal/Alertness: Awake/alert Behavior During Therapy: WFL for tasks assessed/performed Overall Cognitive Status: Within Functional Limits for tasks assessed                      General Comments      Exercises         Assessment/Plan    PT Assessment Patient needs continued PT services  PT Diagnosis Difficulty walking;Generalized weakness   PT Problem List Decreased strength;Decreased range of motion;Decreased activity tolerance;Decreased mobility;Decreased knowledge of use of DME;Pain  PT Treatment Interventions DME instruction;Gait training;Stair training;Functional mobility training;Therapeutic activities;Therapeutic exercise;Patient/family education   PT Goals (Current goals can be found in the Care Plan section) Acute Rehab PT Goals Patient Stated Goal: walk PT Goal Formulation: With patient Time For Goal Achievement: 12/19/14 Potential to Achieve Goals: Good    Frequency Min 3X/week   Barriers to discharge        Co-evaluation               End of Session Equipment Utilized During Treatment: Gait belt;Oxygen Activity Tolerance: Patient limited by fatigue;Patient limited by pain Patient left: in bed;with call bell/phone within reach Nurse Communication: Mobility status;Patient requests pain meds         Time: 4142-3953 PT Time Calculation (min) (ACUTE ONLY): 36 min   Charges:   PT Evaluation $Initial PT Evaluation Tier I: 1 Procedure PT Treatments $Gait Training: 8-22 mins $Therapeutic Activity: 8-22 mins   PT G Codes:          Ronson Hagins 12/05/2014, 1:28 PM

## 2014-12-05 NOTE — Care Management Note (Addendum)
Page 1 of 2   12/21/2014     2:33:52 PM CARE MANAGEMENT NOTE 12/21/2014  Patient:  Daryl Vega, Daryl Vega   Account Number:  [id number]  Date Initiated:  12/05/2014  Documentation initiated by:  Dessa Phi  Subjective/Objective Assessment:   68 y/o m admitted w/hematemesis.CX:[id number]     Action/Plan:   From home.   Anticipated DC Date:  12/21/2014   Anticipated DC Plan:  [facility]  In-house referral  Clinical Social Worker      DC Planning Services  CM consult      Choice offered to / List presented to:  C-3 Spouse   DME arranged  TUBE FEEDING  TUBE FEEDING PUMP      DME agency  [place] arranged  HH-1 RN  [place]      [place].   Status of service:  Completed, signed off Medicare Important Message given?  YES (If response is "NO", the following Medicare IM given date fields will be blank) Date Medicare IM given:  12/18/2014 Medicare IM given by:  Dessa Phi Date Additional Medicare IM given:  12/21/2014 Additional Medicare IM given by:  Dessa Phi  Discharge Disposition:  [place]  Per UR Regulation:  Reviewed for med. necessity/level of care/duration of stay  If discussed at [place] of Stay Meetings, dates discussed:   12/05/2014  12/19/2014  12/21/2014    Comments:  12/21/14 Dessa Phi RN BSN NCM [phone] 2:25P-Per spouse lots of confusion about the quotes for the formula from [facility], she was given a price quote, but then she lost the person she was talking to,when she called back, know one in the office could tell who she spoke to. Finally [facility] dme rep Pura Spice called me, & I went into patient's rm so Pura Spice could explain to her the final quote for the formula.  Spouse & [facility] dme rep will make arrangements for delivery of feeding pump,formula,teaching of supplies,& feeding schedule.Spouse frustrated about the process,  but agreeable to d/c home & cost of pump, & formula.MD/Nurse/[facility] updated-aware of spouse's concerns.Spouse able to transport home on own.  [facility] dme rep Lecretia-following on new formula-[employer]-will confirm out of pocket cost to spouse.MD updated.  12/20/14 Dessa Phi RN BSN NCM [phone] 2:50p-Just received call from [facility] dme rep checking inot TF, if able to qualify for insurance to cover since eating soft foods. MD notified of still checking with [facility] to confirm if able to provide another option.  noted additional [place] needs-HH aide,HH Education officer, museum, TF-Feeding pump, need specific feeding tube orders:see RD note. [facility] orders received.TC Kristen aware of orders.Noted d/c summary from attending yesterday.SX also following.  12/18/14 Dessa Phi RN BSN NCM [phone] [facility] chosen for [place].Recommend HHRN/HHPT.TC Kristen aware of referral.Await final [place] orders.  12-13-14 Sunday Spillers RN CM 1300 Recieved a call back from wife after hours, left a vm to call her back. Returned the call but no answer, left vm again. Anticipate home with [facility] now as physical status seems improved but need to confirm with wife ability to care for patient.  12-11-14 Sunday Spillers RN CM 1400 Recieved a referral for possible LTACH for this patient. Spoke with patient at bedside, explained LTACH to him, he stated he would defer decisions to his wife. Contacted wife to discuss, left her a vm. [facility]  and [facility] to review [facility], [facility] unable to offer a bed, [facility] ok to accept patient. Spoke with Dr. Doyle Askew and she states per surgery not ready for d/c at this time. Will continue to follow for d/c needs and await call back from wife.  12/06/14 CM notes PT eval recc is for SNF; CSW following. CM will monitor for change in disposiiton.  Mariane Masters, BSN, [place].  12/05/14 Dessa Phi RN BSN NCM [phone] pod#5 duodenal upcer, cholecystectomy,duodenostomy fistula repair,c diff+,TF,JP,ngt ordered for d/c, iv  abx.Current d/c plan home.

## 2014-12-05 NOTE — Progress Notes (Signed)
PULMONARY / CRITICAL CARE MEDICINE   Name: Daryl Vega MRN: [id number] DOB: 12-25-1945    ADMISSION DATE:  11/29/2014 CONSULTATION DATE:  11/29/2014  REFERRING MD :  Zella Richer  CHIEF COMPLAINT:  Post op vent management  INITIAL PRESENTATION: 68 y/o male admitted on 12/16 with a GI bleed.  He went for an endoscopy on 12/17 and was found to have a bleeding duodenal ulcer which rapidly progressed and he was taken to the OR on 12/17 emergently.  There he had an emergency exploratory laparotomy, oversewing of a posterior bleeding duodenal ulcer, pyloroplasty, cholecystectomy, repair of R hepatic artery, repair of cholecystoduodenal fistula and placement of a duodenostomy tube and feeding J-tube.  PCCM consulted for post op ventilator management.  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 Pneumonia 12/21 dark stool, Hgb stable 12/22 no more dark stool, c.diff pcr positive  SUBJECTIVE:  No more dark stool, c.diff pcr positive   VITAL SIGNS: Temp:  [98.1 F (36.7 C)-99.3 F (37.4 C)] 98.1 F (36.7 C) (12/22 0800) Pulse Rate:  [113-125] 120 (12/21 1900) Resp:  [12-30] 18 (12/22 0900) BP: (83-136)/(52-72) 87/60 mmHg (12/22 0900) SpO2:  [96 %-100 %] 98 % (12/22 0900) Weight:  [71.4 kg (157 lb 6.5 oz)] 71.4 kg (157 lb 6.5 oz) (12/22 0400)   HEMODYNAMICS:     VENTILATOR SETTINGS:     INTAKE / OUTPUT:  Intake/Output Summary (Last 24 hours) at 12/05/14 1042 Last data filed at 12/05/14 1009  Gross per 24 hour  Intake 3721.33 ml  Output   4310 ml  Net -588.67 ml    PHYSICAL EXAMINATION: General:  Chronically ill appearing,  no distress HEENT: NCAT, EOMi PULM:  CTA B today CV: RRR, no mgr AB: BS+, duodenostomy tube, feeding J, JP drain all in place; mild tenderness around incisions but no diffuse belly tenderness Ext: warm, no edema Neuro: A&Ox4, maew  LABS:  CBC  Recent Labs Lab 12/04/14 1650 12/05/14 12/05/14 0356  WBC 3.6* 4.7 4.1  HGB 8.6* 8.4* 8.2*  HCT 26.6* 26.5* 26.1*  PLT 85* 99* 101*   Coag's  Recent Labs Lab 11/29/14 0644 11/30/14 1830 12/04/14 0744  APTT 24  --  35  INR 1.21 1.32 1.42   BMET  Recent Labs Lab 12/03/14 0400 12/04/14 0744 12/05/14 0356  NA 138 140 139  K 3.2* 2.9* 2.8*  CL 102 99 104  CO2 27 27 28   BUN 13 16 15   CREATININE 0.68 0.70 0.72  GLUCOSE 155* 127* 164*   Electrolytes  Recent Labs Lab 12/03/14 0400 12/04/14 0346 12/04/14 0744 12/05/14 0356  CALCIUM 7.9*  --  8.5 7.7*  MG 1.7 1.6  --  1.4*  PHOS 2.3 3.8  --  3.1   Sepsis Markers  Recent Labs Lab 11/30/14 2009 12/01/14 1500 12/02/14 0446 12/03/14 1015 12/04/14 0346 12/05/14 0356  LATICACIDVEN 1.3 1.3 0.9  --   --   --   PROCALCITON  --   --   --  0.43 0.37 0.38   ABG  Recent Labs Lab 11/30/14 1755 12/01/14 0329 12/01/14 0910  PHART 7.261* 7.265* 7.341*  PCO2ART 31.1* 33.5* 27.6*  PO2ART  124.0* 162.0* 128.0*   Liver Enzymes  Recent Labs Lab 11/29/14 1554 11/30/14 0326 11/30/14 1830  AST 10 10 97*  ALT 11 9 97*  ALKPHOS 48 41 47  BILITOT 1.6* 0.8 1.8*  ALBUMIN 2.6* 2.1* 2.1*   Cardiac Enzymes  Recent Labs Lab 11/29/14 1554 11/29/14 2211  TROPONINI <0.30 <0.30   Glucose  Recent Labs Lab 12/04/14 1550 12/04/14 1715 12/04/14 1929 12/05/14 0026 12/05/14 0347 12/05/14 0831  GLUCAP 164* 166* 148* 163* 161* 183*    Imaging Dg Chest Port 1 View  12/04/2014   CLINICAL DATA:  Central catheter placement  EXAM: PORTABLE CHEST - 1 VIEW  COMPARISON:  December 03, 2014  FINDINGS: Central catheter tip is in the right atrium, slightly beyond the cavoatrial  junction. Nasogastric tube tip and side port are below the diaphragm. Cordis has been removed from the right side. No pneumothorax. There is infiltrate and volume loss the right upper lobe, stable. There is focal infiltrate in the left lower lobe is well. Heart size and pulmonary vascularity are normal. No adenopathy.  IMPRESSION: Tube and catheter positions as described without pneumothorax. Volume loss with patchy infiltrate right upper lobe. Focal area of infiltrate left base as well.   Electronically Signed   By: Lowella Grip M.D.   On: 12/04/2014 19:01     ASSESSMENT / PLAN:  PULMONARY OETT 12/17 >> 12/18 A:  Acute respiratory failure with hypoxemia - atelectasis, improving Stage IIIB Squamous Cell Lung cancer - dx 07/2014, Rx carboplatin and AUC 08/2014 P:   Stop antibiotics Pulm hygiene:  IS, mobilize as able O2 as needed for O2 saturation > 90% Continue home symbicort + xopenex PRN  CARDIOVASCULAR CVL R IJ Cordis 12/17 >>12/20 PICC 12/21 >> A:  Hypertension Tachycardia  P:  PRN labetalol Cont tele   RENAL A:  Metabolic acidosis resolved Hypokalemia  P:   Monitor BMET and UOP Replace electrolytes as needed  GASTROINTESTINAL A:   Acute GI Bleeding - d/t bleeding DU and 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 Not clear he actually has c diff colitis as 20% or more of hospitalized patients are colonized; he has no evidence  of colitis (no fever, belly pain, leukocytosis P:   Stop vanc/zosyn now Follow PCT, essentially negative thus far  Blood culture 12/20 >  C.diff pcr 12/22> positive Vanc 12/20 >12/22 Zosyn 12/20 > 12/22 Flagyl 12/22 > current plan is for 14 days, but if he doesn't have fever or leukocytosis would consider stopping as this may just be colonization  ENDOCRINE A:   DM2   P:   SSI  NEUROLOGIC A:  Pain - Post op abd surgery Baseline Benzo Dependent - temazepam QHS + lorazepam PRN Oversedated 12/22 from dilaudid P: PRN analgesia  > changed to dilaudid 12/19, decrease dose    FAMILY  - Updates: updated son at length 12/21   Transfer to [facility] 12/23, PCCM off  Roselie Awkward, MD [place] [facility]M Pager: [phone] Cell: [phone] If no response, call [phone]

## 2014-12-05 NOTE — Addendum Note (Signed)
Addendum  created 12/05/14 0707 by Garrel Ridgel, CRNA   Modules edited: Anesthesia Events, Narrator   Narrator:  Narrator: Event Log Edited

## 2014-12-05 NOTE — Progress Notes (Signed)
[facility] Progress Note Patient Name: Roran Wegner DOB: 23-May-1946 MRN: [id number]   Date of Service  12/05/2014  HPI/Events of Note  Hypokalemia and hypomag  eICU Interventions  Potassium and mag replaced     Intervention Category Intermediate Interventions: Electrolyte abnormality - evaluation and management  Zigmond Trela 12/05/2014, 5:20 AM

## 2014-12-06 ENCOUNTER — Other Ambulatory Visit: Payer: BC Managed Care – PPO

## 2014-12-06 DIAGNOSIS — J9601 Acute respiratory failure with hypoxia: Secondary | ICD-10-CM

## 2014-12-06 LAB — POTASSIUM: Potassium: 3.6 mmol/L (ref 3.5–5.1)

## 2014-12-06 LAB — CBC
HEMATOCRIT: 27 % — AB (ref 39.0–52.0)
HEMOGLOBIN: 8.6 g/dL — AB (ref 13.0–17.0)
MCH: 28.7 pg (ref 26.0–34.0)
MCHC: 31.9 g/dL (ref 30.0–36.0)
MCV: 90 fL (ref 78.0–100.0)
Platelets: 118 10*3/uL — ABNORMAL LOW (ref 150–400)
RBC: 3 MIL/uL — ABNORMAL LOW (ref 4.22–5.81)
RDW: 17.9 % — ABNORMAL HIGH (ref 11.5–15.5)
WBC: 4.4 10*3/uL (ref 4.0–10.5)

## 2014-12-06 LAB — GLUCOSE, CAPILLARY
GLUCOSE-CAPILLARY: 218 mg/dL — AB (ref 70–99)
Glucose-Capillary: 183 mg/dL — ABNORMAL HIGH (ref 70–99)
Glucose-Capillary: 200 mg/dL — ABNORMAL HIGH (ref 70–99)
Glucose-Capillary: 214 mg/dL — ABNORMAL HIGH (ref 70–99)
Glucose-Capillary: 206 mg/dL — ABNORMAL HIGH (ref 70–99)
Glucose-Capillary: 192 mg/dL — ABNORMAL HIGH (ref 70–99)

## 2014-12-06 LAB — BASIC METABOLIC PANEL
Anion gap: 6 (ref 5–15)
BUN: 19 mg/dL (ref 6–23)
CHLORIDE: 108 meq/L (ref 96–112)
CO2: 24 mmol/L (ref 19–32)
CREATININE: 0.73 mg/dL (ref 0.50–1.35)
Calcium: 7.7 mg/dL — ABNORMAL LOW (ref 8.4–10.5)
GFR calc Af Amer: >90 mL/min (ref >90)
GFR calc non Af Amer: >90 mL/min (ref >90)
GLUCOSE: 230 mg/dL — AB (ref 70–99)
Potassium: 2.9 mmol/L — ABNORMAL LOW (ref 3.5–5.1)
Sodium: 138 mmol/L (ref 135–145)

## 2014-12-06 LAB — MAGNESIUM
MAGNESIUM: 1.9 mg/dL (ref 1.5–2.5)
Magnesium: 1.4 mg/dL — ABNORMAL LOW (ref 1.5–2.5)

## 2014-12-06 MED ORDER — SODIUM CHLORIDE 0.9 % IV SOLN
INTRAVENOUS | Status: DC
  Administered 2014-12-06 – 2014-12-10 (×10): via INTRAVENOUS
  Filled 2014-12-06 (×23): qty 1000

## 2014-12-06 MED ORDER — MAGNESIUM SULFATE 4 GM/100ML IV SOLN
4.0000 g | Freq: Once | INTRAVENOUS | Status: AC
  Administered 2014-12-06: 4 g via INTRAVENOUS
  Filled 2014-12-06: qty 100

## 2014-12-06 MED ORDER — VANCOMYCIN 50 MG/ML ORAL SOLUTION: 125.0000 mg | Freq: Four times a day (QID) | ORAL | Status: DC

## 2014-12-06 MED ORDER — POTASSIUM CHLORIDE 20 MEQ/15ML (10%) PO SOLN
40.0000 meq | Freq: Three times a day (TID) | ORAL | Status: DC
  Administered 2014-12-06 – 2014-12-21 (×46): 40 meq
  Filled 2014-12-06 (×49): qty 30

## 2014-12-06 MED ORDER — MORPHINE SULFATE 2 MG/ML IJ SOLN
1.0000 mg | INTRAMUSCULAR | Status: DC | PRN
  Administered 2014-12-06 – 2014-12-19 (×63): 2 mg via INTRAVENOUS
  Filled 2014-12-06 (×65): qty 1

## 2014-12-06 MED ORDER — VANCOMYCIN 50 MG/ML ORAL SOLUTION
125.0000 mg | Freq: Four times a day (QID) | ORAL | Status: DC
  Administered 2014-12-06 – 2014-12-19 (×55): 125 mg
  Filled 2014-12-06 (×69): qty 2.5

## 2014-12-06 MED ORDER — HYDROCODONE-ACETAMINOPHEN 7.5-325 MG/15ML PO SOLN
10.0000 mL | ORAL | Status: DC | PRN
  Administered 2014-12-06 – 2014-12-12 (×17): 10 mL
  Filled 2014-12-06 (×17): qty 15

## 2014-12-06 NOTE — Progress Notes (Signed)
[facility] Progress Note Patient Name: Daryl Vega DOB: [birth year] MRN: [id number]   Date of Service  12/06/2014  HPI/Events of Note  Dilaudid -not working per RN  eICU Interventions  Change to morphine prn     Intervention Category Intermediate Interventions: Pain - evaluation and management  ALVA,RAKESH V. 12/06/2014, 2:17 AM

## 2014-12-06 NOTE — Progress Notes (Signed)
CSW reviewed chart and noted PT recommending SNF upon discharge.  CSW discussed with RN who reports that pt not yet appropriate for discharge planning as pt remains in step down unit, pt has duodenostomy tube, JP drain, rectal tube, and flexiseal and remains NPO with tube feeds.  CSW to follow up when pt more stable to assess for disposition needs. Full assessment to be completed at this time.  Alison Murray, MSW, [place] [employer] [phone]

## 2014-12-06 NOTE — Progress Notes (Signed)
6 Days Post-Op  Subjective: He says he hurts all over, and feels terrible. Despite feeling bad and being in a bad humor he looks pretty stable.  K+, Mag+, fluid balance is still an issue.  Objective: Vital signs in last 24 hours: Temp:  [97 F (36.1 C)-99.2 F (37.3 C)] 98.7 F (37.1 C) (12/23 0400) Resp:  [12-28] 22 (12/23 0600) BP: (84-114)/(52-82) 97/65 mmHg (12/23 0600) SpO2:  [96 %-100 %] 99 % (12/23 0600) Last BM Date: 12/05/14 1200 urine 1705 from duodenostomy tube, 1100 from the rectal tube Afebrile, Tachycardic K+2.9, mag 1.4 WBC 4.4 Drainage from the duodenostomy tube is not bilious. Intake/Output from previous day: 12/22 0701 - 12/23 0700 In: 2405 [I.V.:430; NG/GT:1475; IV Piggyback:500] Out: 5277 [Urine:1205; Emesis/NG output:100; Drains:1705; OEUMP:5361] Intake/Output this shift:    General appearance: alert, cooperative and just feels bad all over. Resp: clear to auscultation bilaterally and anterior GI: both tubes look fine, midline incision is OK, BS Hypoactive, having allot of diarrhea.  Lab Results:   Recent Labs  12/05/14 0356 12/06/14 0400  WBC 4.1 4.4  HGB 8.2* 8.6*  HCT 26.1* 27.0*  PLT 101* 118*    BMET  Recent Labs  12/05/14 0356 12/05/14 1600 12/06/14 0400  NA 139  --  138  K 2.8* 3.2* 2.9*  CL 104  --  108  CO2 28  --  24  GLUCOSE 164*  --  230*  BUN 15  --  19  CREATININE 0.72  --  0.73  CALCIUM 7.7*  --  7.7*   PT/INR  Recent Labs  12/04/14 0744  LABPROT 17.5*  INR 1.42     Recent Labs Lab 11/29/14 1554 11/30/14 0326 11/30/14 1830  AST 10 10 97*  ALT 11 9 97*  ALKPHOS 48 41 47  BILITOT 1.6* 0.8 1.8*  PROT 4.9* 4.1* 4.0*  ALBUMIN 2.6* 2.1* 2.1*     Lipase     Component Value Date/Time   LIPASE 41 11/29/2014 0644     Studies/Results: Dg Chest Port 1 View  12/05/2014   CLINICAL DATA:  Lung atelectatic change  EXAM: PORTABLE CHEST - 1 VIEW  COMPARISON:  December 04, 2014  FINDINGS: Tube and catheter  positions are unchanged without pneumothorax. There is patchy infiltrate with volume loss right upper lobe. There is also patchy infiltrate left lower lobe. No new opacity. Heart is normal in size and contour. Pulmonary vascular is normal. No adenopathy.  IMPRESSION: Right upper lobe and left lower lobe infiltrate. Volume loss right upper lobe, stable. No change in cardiac silhouette. Tube and catheter positions unchanged. No pneumothorax.   Electronically Signed   By: Lowella Grip M.D.   On: 12/05/2014 07:16   Dg Chest Port 1 View  12/04/2014   CLINICAL DATA:  Central catheter placement  EXAM: PORTABLE CHEST - 1 VIEW  COMPARISON:  December 03, 2014  FINDINGS: Central catheter tip is in the right atrium, slightly beyond the cavoatrial junction. Nasogastric tube tip and side port are below the diaphragm. Cordis has been removed from the right side. No pneumothorax. There is infiltrate and volume loss the right upper lobe, stable. There is focal infiltrate in the left lower lobe is well. Heart size and pulmonary vascularity are normal. No adenopathy.  IMPRESSION: Tube and catheter positions as described without pneumothorax. Volume loss with patchy infiltrate right upper lobe. Focal area of infiltrate left base as well.   Electronically Signed   By: Lowella Grip M.D.   On:  12/04/2014 19:01    Medications: . budesonide-formoterol  2 puff Inhalation BID  . chlorhexidine  15 mL Mouth Rinse BID  . insulin aspart  0-15 Units Subcutaneous 6 times per day  . magnesium sulfate 1 - 4 g bolus IVPB  4 g Intravenous Once  . pantoprazole (PROTONIX) IV  40 mg Intravenous Q12H  . potassium chloride  40 mEq Per Tube TID  . sodium chloride  10-40 mL Intracatheter Q12H  . sodium chloride  3 mL Intravenous Q12H  . vancomycin  125 mg Per Tube 4 times per day    Assessment/Plan 1. Bleeding duodenal ulcer with cholecystoduodenal fistula -EGD for control of bleeding 11/30/16 [name] -POD #5  Emergency Ex Lap, oversewing of posterior bleeding DU, Heineke Mikulicz pyloroplasty, cholecystectomy, repair of right hepatic artery, repair of cholecystoduodenal fistula and placement of duodenostomy tube, feeding jejunostomy. 11/30/14, Dr. Jackolyn Confer 2. Non small cell lung carcinoma, Stage IIIB (T3, N3, M0) undergoing chemotherapy and radiation therapy. (07/2014) 3. AODM 4. Hx of Melanoma 5. 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 Hypokalemia - 2.9 & Hypomagnesemia 1.4 - ordered K+ supplements, repeat labs after supplements have been given 8. Hematochezia 9. C.Diff positive - changing to oral vancomycin 10.  Tachycardia/hypotension 11.  SCD for DVT, he has not been on heparin because of the bleed.  Plan:  Dr. Dalbert Batman has increased his fluids for today, his tube feeding is up to goal, but he is almost 2 liters negative.  We have increased his PO KCL and giving IV Mag 4 grams today.  We have ordered repeat labs at 3 PM and in AM.  I am going to give him some ice chips, popsicle, and some flavor over ice chips.  OOB to chair, and we will get PT involved once he has the rectal feeding tube.       LOS: 7 days    Edna Rede 12/06/2014

## 2014-12-06 NOTE — Progress Notes (Signed)
Patient ID: Daryl Vega, male   DOB: 18-Jan-1946, 68 y.o.   MRN: [id number]  [facility] PROGRESS NOTE  [name] [facility] LYY:[id number] DOB: [birth year] DOA: 11/29/2014 PCP: [name], MD  Brief narrative: 68 y/o male admitted on 12/16 with a GI bleed. Underwent endoscopy on 12/17 and was found to have a bleeding duodenal ulcer which rapidly progressed and he was taken to the OR on 12/17 emergently.There, he had an emergency ex laparotomy, oversewing of a posterior bleeding duodenal ulcer, pyloroplasty, cholecystectomy, repair of R hepatic artery, repair of cholecystoduodenal fistula and placement of a duodenostomy tube and feeding J-tube. PCCM consulted for post op ventilator management.  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 Pneumonia 12/21 dark stool, Hgb stable 12-Dec-2023 no more dark stool, c.diff pcr positive  Assessment and Plan:   Acute respiratory failure with hypoxemia - atelectasis, improving Stage IIIB Squamous Cell Lung cancer - dx 07/2014, Rx carboplatin and AUC 08/2014 - VDRF, now extubated (since 12/18), respiratory status stable  - no fever and no leukocytosis  - continue pulm hygiene: IS, mobilize as able - O2 as needed for O2 saturation > 90% - continue home symbicort + xopenex PRN  CVL R IJ Cordis 12/17 >>12/20 PICC 12/21 >> - with HTN and episodes of tachycardia - continue monitoring on telemetry  - provide PRN labetalol   Metabolic acidosis resolved Hypokalemia  - with hypomagnesemia - will need to supplement (surgery team already placed orders)  - repeat BMP and Mg level in AM  Acute GI Bleeding - d/t  bleeding DU and 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 Diff - NPO x ice chips - Tube Feeding and Post op care per Gen surgery - Advance TF per Nutrition   Massive GI bleed from duodenal ulcer > 12/12/2023 no active bleeding Acute Blood Loss Anemia  Thrombocytopenia - improving - Transfuse for hgb < 7gm/dL - continue PPI  C.diff? Not clear he actually has c diff colitis as 20% or more of hospitalized patients are colonized No HCAP - continue oral vancomycin   DM2  - SSI  Pain - Post op abd surgery Baseline Benzo Dependent - temazepam QHS + lorazepam PRN Oversedated December 12, 2023 from dilaudid - PRN analgesia > changed to dilaudid 12/19, decrease dose   DVT prophylaxis - SCD's  Code Status: Full Family Communication: Pt at bedside Disposition Plan: keep in SDU   IV Access:   Peripheral IV Procedures and diagnostic studies:   Dg Chest Port 1 View  December 11, 2014  Right upper lobe and left lower lobe infiltrate. Volume loss right upper lobe, stable. No change in cardiac silhouette. Tube and catheter positions unchanged. No pneumothorax.   Dg Chest Port 1 View  12/04/2014   Tube and catheter positions as described without pneumothorax. Volume loss with patchy infiltrate right upper lobe. Focal area of infiltrate left base as well.     Medical Consultants:   Surgery  Other Consultants:   None  Anti-Infectives/microbiology data:   Oral Vancomycin  Blood culture 12/20 >  C.diff pcr December 12, 2023 positive Vanc 12/20 12/12/2023 Zosyn 12/20 > 12-12-2023 Flagyl  12/22 > current plan is for 14 days, but if he doesn't have fever or leukocytosis, consider stopping as this may just be colonization  [name], MD  [facility] Pager [phone]  If 7PM-7AM, please contact night-coverage [email or web address] Password [email or web address] 12/06/2014, 2:30 PM   LOS: 7 days    HPI/Subjective: No events overnight.   Objective: Filed Vitals:   12/06/14 0800 12/06/14 0839 12/06/14 1043 12/06/14 1311  BP: 97/63   93/63  Pulse:      Temp: 98.1 F (36.7 C)     TempSrc: Axillary     Resp: 24 24  20   Height:      Weight:      SpO2: 99% 99% 97% 97%    Intake/Output Summary (Last 24 hours) at 12/06/14 1430 Last data filed at 12/06/14 1300  Gross per 24 hour  Intake   2645 ml  Output   3015 ml  Net   -370 ml    Exam:   General:  Pt is alert, follows commands appropriately, not in acute distress  Cardiovascular: Regular rate and rhythm, no rubs, no gallops  Respiratory: Clear to auscultation bilaterally, no wheezing, no crackles, no rhonchi  Abdomen: Soft, non tender, no guarding  Extremities: No edema, pulses DP and PT palpable bilaterally   Data Reviewed: Basic Metabolic Panel:  Recent Labs Lab 12/01/14 1500 12/02/14 0300 12/03/14 0400 12/04/14 0346 12/04/14 0744 12/05/14 0356 12/05/14 1600 12/06/14 0400  NA 141 140 138  --  140 139  --  138  K 3.4* 3.5* 3.2*  --  2.9* 2.8* 3.2* 2.9*  CL 112 109 102  --  99 104  --  108  CO2 17* 23 27  --  27 28  --  24  GLUCOSE 199* 164* 155*  --  127* 164*  --  230*  BUN 20 15 13   --  16 15  --  19  CREATININE 0.80 0.78 0.68  --  0.70 0.72  --  0.73  CALCIUM 7.0* 7.7* 7.9*  --  8.5 7.7*  --  7.7*  MG 1.2* 1.8 1.7 1.6  --  1.4*  --  1.4*  PHOS 2.0* 1.9* 2.3 3.8  --  3.1  --   --    Liver Function Tests:  Recent Labs Lab 11/29/14 1554 11/30/14 0326 11/30/14 1830  AST 10 10 97*  ALT 11 9 97*  ALKPHOS 48 41 47  BILITOT 1.6* 0.8 1.8*  PROT 4.9* 4.1* 4.0*  ALBUMIN 2.6* 2.1* 2.1*   CBC:  Recent Labs Lab 11/30/14 1145  12/01/14 1500  12/03/14 0400 12/04/14 0744 12/04/14 1650 12/05/14 12/05/14 0356 12/06/14 0400  WBC 2.6*  < > 3.2*  < > 2.8* 3.0* 3.6* 4.7 4.1 4.4  NEUTROABS 1.7  --  2.5  --  2.2  --   --   --   --   --   HGB 7.5*  < > 8.5*  < > 7.9* 8.9* 8.6* 8.4* 8.2* 8.6*  HCT  22.9*  < > 25.0*  < > 23.1* 27.5* 26.6* 26.5* 26.1* 27.0*  MCV 87.7  < > 86.8  < > 88.2 89.6 90.2 90.8 90.9 90.0  PLT 80*  < > 71*  < > 59* 93* 85* 99* 101* 118*  < > = values in this interval not displayed. Cardiac Enzymes:  Recent Labs Lab 11/29/14 1554 11/29/14 2211  TROPONINI <0.30 <0.30   CBG:  Recent Labs Lab 12/05/14 1943 12/05/14 2343 12/06/14 0403 12/06/14  1740 12/06/14 1202  GLUCAP 169* 183* 218* 200* 214*    Recent Results (from the past 240 hour(s))  MRSA PCR Screening     Status: None   Collection Time: 11/29/14  9:03 AM  Result Value Ref Range Status   MRSA by PCR NEGATIVE NEGATIVE Final  Culture, blood (routine x 2)     Status: None (Preliminary result)   Collection Time: 12/03/14  9:54 AM  Result Value Ref Range Status   Specimen Description BLOOD RIGHT HAND  Final   Report Status PENDING  Incomplete  Culture, blood (routine x 2)     Status: None (Preliminary result)   Collection Time: 12/03/14 10:11 AM  Result Value Ref Range Status   Specimen Description BLOOD LEFT HAND  Final   Special Requests   Final    BOTTLES DRAWN AEROBIC AND ANAEROBIC 10CC BOTH BOTTLES   Report Status PENDING  Incomplete  Clostridium Difficile by PCR     Status: Abnormal   Collection Time: 12/04/14  6:23 PM  Result Value Ref Range Status   C difficile by pcr POSITIVE (A) NEGATIVE Final     Scheduled Meds: . budesonide-formoterol  2 puff Inhalation BID  . insulin aspart  0-15 Units Subcutaneous 6 times per day  . Pantoprazole IV  40 mg Intravenous Q12H  . potassium chloride  40 mEq Per Tube TID  . vancomycin  125 mg Per Tube 4 times per day   Continuous Infusions: . sodium chloride 10 mL/hr at 12/05/14 0216  . sodium chloride 10 mL/hr at 12/05/14 0217  . feeding supplement (GLUCERNA 1.2 CAL) 1,000 mL (12/06/14 0509)  . sodium chloride 0.9 % 1,000 mL with potassium chloride 40 mEq infusion 125 mL/hr at 12/06/14 8144

## 2014-12-06 NOTE — Progress Notes (Signed)
Inpatient Diabetes Program Recommendations  AACE/ADA: New Consensus Statement on Inpatient Glycemic Control (2013)  Target Ranges:  Prepandial:   less than 140 mg/dL      Peak postprandial:   less than 180 mg/dL (1-2 hours)      Critically ill patients:  140 - 180 mg/dL   Reason for Visit: Hyperglycemia  Results for Daryl Vega, Daryl Vega (MRN [id number]) as of 12/06/2014 16:01  Ref. Range 12/06/2014 04:03 12/06/2014 08:17 12/06/2014 12:02  Glucose-Capillary Latest Range: 70-99 mg/dL 218 (H) 200 (H) 214 (H)     Inpatient Diabetes Program Recommendations Insulin - Basal: If FBS >180 mg/dL, may benefit from low-dose basal insulin - Lantus 8 units QHS Insulin - Meal Coverage: Consider addition of Novolog 3 units Q4H for TF coverage  Note: Will continue to follow. Thank you. Lorenda Peck, RD, LDN, CDE Inpatient Diabetes Coordinator [phone]

## 2014-12-06 NOTE — Progress Notes (Signed)
NUTRITION FOLLOW UP  Intervention:    Continue Glucerna 1.2 at goal rate of 65 ml/hr.   Tube feeding goal regimen provides 1872 kcal (100% of needs), 94 grams of protein, and 1264 ml of H2O.   - Diet advancement per surgery as medically appropriate.   Nutrition Dx:   Inadequate oral intake related to nausea/taste changes as evidenced by PO intake < 75%, 50 lb weight loss; ongoing, now r/t to NPO status; ongoing  Goal:   TF to meet >/= 90% of their estimated nutrition needs; met   Monitor:   TF tolerance, total protein/energy intake, labs, weight, GI profile  Assessment:   68 y.o. male with a PMHx of DM2, HLD, melanoma, NSCLC currently undergoing radiation/chemotherapy, who presents to the ED with complaints of lightheadedness , hematemesis, melena, syncopal episode with standing which has been ongoing for ~1week and caused him to fall back into the bathroom wall this morning around 3:30 am.  12/17: -Pt reported an unintentional wt loss of ~50 lbs since beginning of chemo and radiation treatments, approximately 8 months ago (25% body weight loss, severe for time frame) -Endorsed ongoing nausea and taste changes that have inhibited appetite. Pt consumes one Boost daily, and minimal intake of meals or snacks. Enjoys sweets and snacks vs meals -RD encouraged use of anti-emetics, as well as implementing use of oral rinses to assist with taste changes -Has been evaluated by outpatient oncology RD in 08/2014 for persistent nausea and vomiting. Was educated and provided nutrition handouts/resources. Has experienced ~10 lb weight loss since outpatient appointment. -Pt NPO for EGD d/t possible GI bleed. -RD to order Boost once daily + snacks w/diet advancement. Pt willing to consume MagicCup and pudding inbetween meals  12/18: -Per MD note, pt with bleeding duodenal bulb ulcer and fistula; duodenostomy tube and Jtube was placed -Glucerna 1.2 at 20 ml/hr infusing at 20 ml/hr per surgery  recommendations. Providing 576 kcal, 15 gram protein, 386 ml free water -CBG elevated -Day 1 post op.Patient is currently intubated on ventilator support MV: 10.8 L/min Temp (24hrs), Avg:98.4 F (36.9 C), Min:97 F (36.1 C), Max:99.2 F (37.3 C)  12/21: - Pt extubated 12/19. Tolerating TF at 40 ml/hr. TF advanced this am per surgery. Spoke with RN. Residuals unable to be checked due to collapse of tube upon attempt. Coffee ground material draining from NG tube, does not look like TF. Bowel movements are loose and black.   12/23: - Pt tolerating TF at goal rate.  - Pt having diarrhea due to C.Diff - Surgery is allowing pt to have ice chips, flavor over ice, and popsicle which pt is tolerating. He said that he had some yogurt? this morning which he liked and tolerated. He was asking when he will be able to have his diet upgraded.  Will continue to monitor surgery notes.   Labs: Na WNL K low BUN WNL Mg Low  Height: Ht Readings from Last 1 Encounters:  11/29/14 _0  (1.727 m)    Weight Status:   Wt Readings from Last 1 Encounters:  12/05/14 157 lb 6.5 oz (71.4 kg)    Re-estimated needs:  Kcal: 1800-2000 Protein: 85-100 gram Fluid: >/=2000 ml daily  Skin: closed incision on abdomen  Diet Order: Diet NPO time specified Except for: Ice Chips, Other (See Comments)   Intake/Output Summary (Last 24 hours) at 12/06/14 1056 Last data filed at 12/06/14 0600  Gross per 24 hour  Intake   2050 ml  Output   3510 ml  Net  -1460 ml    Last BM: 12/23   Labs:   Recent Labs Lab 12/03/14 0400 12/04/14 0346 12/04/14 0744 12/05/14 0356 12/05/14 1600 12/06/14 0400  NA 138  --  140 139  --  138  K 3.2*  --  2.9* 2.8* 3.2* 2.9*  CL 102  --  99 104  --  108  CO2 27  --  27 28  --  24  BUN 13  --  16 15  --  19  CREATININE 0.68  --  0.70 0.72  --  0.73  CALCIUM 7.9*  --  8.5 7.7*  --  7.7*  MG 1.7 1.6  --  1.4*  --  1.4*  PHOS 2.3 3.8  --  3.1  --   --   GLUCOSE 155*  --   127* 164*  --  230*    CBG (last 3)   Recent Labs  12/05/14 1228 12/05/14 1649 12/05/14 1943  GLUCAP 169* 169* 169*    Scheduled Meds: . budesonide-formoterol  2 puff Inhalation BID  . chlorhexidine  15 mL Mouth Rinse BID  . insulin aspart  0-15 Units Subcutaneous 6 times per day  . pantoprazole (PROTONIX) IV  40 mg Intravenous Q12H  . potassium chloride  40 mEq Per Tube TID  . sodium chloride  10-40 mL Intracatheter Q12H  . sodium chloride  3 mL Intravenous Q12H  . vancomycin  125 mg Per Tube 4 times per day    Continuous Infusions: . sodium chloride 10 mL/hr at 12/05/14 0216  . sodium chloride 10 mL/hr at 12/05/14 0217  . feeding supplement (GLUCERNA 1.2 CAL) 1,000 mL (12/06/14 0509)  . sodium chloride 0.9 % 1,000 mL with potassium chloride 40 mEq infusion 125 mL/hr at 12/06/14 [place] MS, RD, LDN

## 2014-12-07 LAB — COMPREHENSIVE METABOLIC PANEL
ALBUMIN: 1.6 g/dL — AB (ref 3.5–5.2)
ALT: 19 U/L (ref 0–53)
AST: 17 U/L (ref 0–37)
Alkaline Phosphatase: 78 U/L (ref 39–117)
Anion gap: 4 — ABNORMAL LOW (ref 5–15)
BUN: 16 mg/dL (ref 6–23)
CHLORIDE: 114 meq/L — AB (ref 96–112)
CO2: 21 mmol/L (ref 19–32)
Calcium: 7.4 mg/dL — ABNORMAL LOW (ref 8.4–10.5)
Creatinine, Ser: 0.62 mg/dL (ref 0.50–1.35)
GFR calc Af Amer: >90 mL/min (ref >90)
GFR calc non Af Amer: >90 mL/min (ref >90)
Glucose, Bld: 230 mg/dL — ABNORMAL HIGH (ref 70–99)
Potassium: 3.9 mmol/L (ref 3.5–5.1)
Sodium: 139 mmol/L (ref 135–145)
Total Bilirubin: 0.7 mg/dL (ref 0.3–1.2)
Total Protein: 4.5 g/dL — ABNORMAL LOW (ref 6.0–8.3)

## 2014-12-07 LAB — GLUCOSE, CAPILLARY
GLUCOSE-CAPILLARY: 189 mg/dL — AB (ref 70–99)
GLUCOSE-CAPILLARY: 176 mg/dL — AB (ref 70–99)
Glucose-Capillary: 142 mg/dL — ABNORMAL HIGH (ref 70–99)
Glucose-Capillary: 149 mg/dL — ABNORMAL HIGH (ref 70–99)
Glucose-Capillary: 157 mg/dL — ABNORMAL HIGH (ref 70–99)
Glucose-Capillary: 213 mg/dL — ABNORMAL HIGH (ref 70–99)
Glucose-Capillary: 217 mg/dL — ABNORMAL HIGH (ref 70–99)

## 2014-12-07 LAB — AMYLASE, PERITONEAL FLUID: AMYLASE, PERITONEAL FLUID: 16 U/L

## 2014-12-07 LAB — CBC
HEMATOCRIT: 26.1 % — AB (ref 39.0–52.0)
Hemoglobin: 8.1 g/dL — ABNORMAL LOW (ref 13.0–17.0)
MCH: 28.2 pg (ref 26.0–34.0)
MCHC: 31 g/dL (ref 30.0–36.0)
MCV: 90.9 fL (ref 78.0–100.0)
PLATELETS: 125 10*3/uL — AB (ref 150–400)
RBC: 2.87 MIL/uL — AB (ref 4.22–5.81)
RDW: 18.3 % — ABNORMAL HIGH (ref 11.5–15.5)
WBC: 6.6 10*3/uL (ref 4.0–10.5)

## 2014-12-07 LAB — AMYLASE: Amylase: 76 U/L (ref 0–105)

## 2014-12-07 LAB — PREALBUMIN: Prealbumin: 7.3 mg/dL — ABNORMAL LOW (ref 17.0–34.0)

## 2014-12-07 LAB — MAGNESIUM: MAGNESIUM: 1.6 mg/dL (ref 1.5–2.5)

## 2014-12-07 LAB — CREATININE, FLUID (PLEURAL, PERITONEAL, JP DRAINAGE): CREAT FL: 0.7 mg/dL

## 2014-12-07 MED ORDER — WITCH HAZEL-GLYCERIN EX PADS
1.0000 "application " | MEDICATED_PAD | CUTANEOUS | Status: DC | PRN
  Administered 2014-12-15: 1 via TOPICAL
  Filled 2014-12-07: qty 100

## 2014-12-07 MED ORDER — BISMUTH SUBSALICYLATE 262 MG/15ML PO SUSP
30.0000 mL | Freq: Two times a day (BID) | ORAL | Status: DC
  Administered 2014-12-07 – 2014-12-21 (×26): 30 mL
  Filled 2014-12-07 (×4): qty 236

## 2014-12-07 MED ORDER — MAGNESIUM SULFATE 4 GM/100ML IV SOLN
4.0000 g | Freq: Once | INTRAVENOUS | Status: AC
  Administered 2014-12-07: 4 g via INTRAVENOUS
  Filled 2014-12-07: qty 100

## 2014-12-07 MED ORDER — HYDROCORTISONE ACE-PRAMOXINE 2.5-1 % RE CREA
1.0000 "application " | TOPICAL_CREAM | Freq: Four times a day (QID) | RECTAL | Status: DC | PRN
  Filled 2014-12-07: qty 30

## 2014-12-07 NOTE — Progress Notes (Signed)
Physical Therapy Treatment Patient Details Name: Daryl Vega MRN: [id number] DOB: 04/04/1946 Today's Date: 29-Dec-2014    History of Present Illness      PT Comments    Pt pleasant and eager to move from chair.  Increased activity tolerance from previous session.  Follow Up Recommendations  SNF     Equipment Recommendations  Rolling walker with 5" wheels    Recommendations for Other Services OT consult     Precautions / Restrictions Precautions Precautions: Fall Precaution Comments: Multiple drains, tubes and catheters Restrictions Weight Bearing Restrictions: No Other Position/Activity Restrictions: WBAT    Mobility  Bed Mobility Overal bed mobility: Needs Assistance;+2 for physical assistance Bed Mobility: Sit to Supine       Sit to supine: Mod assist;+2 for physical assistance;+2 for safety/equipment   General bed mobility comments: cues for log roll sequencing side ly to sit  Transfers Overall transfer level: Needs assistance Equipment used: Rolling walker (2 wheeled) Transfers: Sit to/from Stand Sit to Stand: Min assist;Mod assist         General transfer comment: cues for transition position and use of UEs to self assist  Ambulation/Gait Ambulation/Gait assistance: +2 safety/equipment;Min assist;Mod assist Ambulation Distance (Feet): 22 Feet Assistive device: Rolling walker (2 wheeled) Gait Pattern/deviations: Step-through pattern;Decreased step length - right;Decreased step length - left;Shuffle;Trunk flexed Gait velocity: decr   General Gait Details: cues for posture and position from RW - several short standing rests to complete trip to opposite side of bed   Stairs            Wheelchair Mobility    Modified Rankin (Stroke Patients Only)       Balance                                    Cognition Arousal/Alertness: Awake/alert Behavior During Therapy: WFL for tasks assessed/performed Overall Cognitive  Status: Within Functional Limits for tasks assessed                      Exercises      General Comments        Pertinent Vitals/Pain Pain Assessment: 0-10 Pain Score: 7  Pain Location: L side Pain Descriptors / Indicators: Sore Pain Intervention(s): Limited activity within patient's tolerance;Monitored during session    Home Living                      Prior Function            PT Goals (current goals can now be found in the care plan section) Acute Rehab PT Goals Patient Stated Goal: walk PT Goal Formulation: With patient Time For Goal Achievement: 12/19/14 Potential to Achieve Goals: Good Progress towards PT goals: Progressing toward goals    Frequency  Min 3X/week    PT Plan Current plan remains appropriate    Co-evaluation             End of Session   Activity Tolerance: Patient limited by fatigue;Patient limited by pain Patient left: in bed;with call bell/phone within reach     Time: 1515-1534 PT Time Calculation (min) (ACUTE ONLY): 19 min  Charges:  $Gait Training: 8-22 mins                    G Codes:      Daryl Vega 12-29-2014, 4:00 PM

## 2014-12-07 NOTE — Progress Notes (Addendum)
Patient ID: Daryl Vega, male   DOB: 1946-03-22, 68 y.o.   MRN: [id number]  [facility] PROGRESS NOTE  [name] [facility] DXA:[id number] DOB: [birth year] DOA: 11/29/2014 PCP: [name], MD  Brief narrative: 68 y/o male admitted on 12/16 with a GI bleed. Underwent endoscopy on 12/17 and was found to have a bleeding duodenal ulcer which rapidly progressed and he was taken to the OR on 12/17 emergently.There, he had an emergency ex laparotomy, oversewing of a posterior bleeding duodenal ulcer, pyloroplasty, cholecystectomy, repair of R hepatic artery, repair of cholecystoduodenal fistula and placement of a duodenostomy tube and feeding J-tube. PCCM consulted for post op ventilator management.  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 Pneumonia 12/21 dark stool, Hgb stable 2023-12-29 no more dark stool, c.diff pcr positive  Assessment and Plan:   Acute respiratory failure with hypoxemia - atelectasis, improving Stage IIIB Squamous Cell Lung cancer - dx 07/2014, Rx carboplatin and AUC 08/2014 - VDRF, now extubated (since 12/18), respiratory status stable  - no fever and no leukocytosis  - continue pulm hygiene: IS, mobilize as able - O2 as needed for O2 saturation > 90% - continue home symbicort + xopenex PRN  CVL R IJ Cordis 12/17 >>12/20 PICC 12/21 >> - with HTN and episodes of tachycardia - continue monitoring on telemetry  - provide PRN labetalol   Metabolic acidosis resolved Hypokalemia  - with hypomagnesemia - K and Mg supplemented and WNL this AM - repeat BMP and Mg level in AM  Acute GI Bleeding - d/t bleeding DU and  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 Diff - EGD for control of bleeding 11/30/16 [name] - POD #5 Emergency Ex Lap, oversewing of posterior bleeding DU, Heineke Mikulicz pyloroplasty, cholecystectomy, repair of right hepatic artery, repair of cholecystoduodenal fistula and placement of duodenostomy tube, feeding jejunostomy. 11/30/14, Dr. Jackolyn Confer - NPO x ice chips - Tube Feeding and Post op care per Gen surgery - Advance TF per Nutrition  Massive GI bleed from duodenal ulcer > 12-29-23 no active bleeding Acute Blood Loss Anemia  Thrombocytopenia - improving - Transfuse for hgb < 7gm/dL - continue PPI  C.diff? Not clear he actually has c diff colitis as 20% or more of hospitalized patients are colonized No HCAP - continue oral vancomycin   DM2  - SSI  Pain - Post op abd surgery Baseline Benzo Dependent - temazepam QHS + lorazepam PRN Oversedated 12/29/23 from dilaudid - PRN analgesia > changed to dilaudid 12/19, decrease dose   DVT prophylaxis - SCD's  Code Status: Full Family Communication: Pt at bedside Disposition Plan: keep in SDU   IV Access:    Peripheral IV Procedures and diagnostic studies:    Dg Chest Port 1 View 12-28-14 Right upper lobe and left lower lobe infiltrate. Volume loss right upper lobe, stable. No change in cardiac silhouette. Tube and catheter positions unchanged. No pneumothorax.   Dg Chest Port 1 View 12/04/2014 Tube and catheter positions as described without pneumothorax. Volume loss with patchy infiltrate right upper lobe. Focal area of infiltrate left base as well.  Medical Consultants:    Surgery  Other Consultants:    None  Anti-Infectives/microbiology data:    Oral Vancomycin   Blood culture 12/20 >   C.diff pcr 12/22> positive  Vanc 12/20  >12/22  Zosyn 12/20 > 12/22  Flagyl 12/22 > current plan is for 14 days, but if he doesn't have fever or leukocytosis, consider stopping as this may just be colonization  [name], MD  [facility] Pager [phone]  If 7PM-7AM, please contact night-coverage [email or web address] Password [email or web address] 12/07/2014, 8:07 AM   LOS: 8 days   HPI/Subjective: No events overnight. Sore "all over".  Objective: Filed Vitals:   12/07/14 0000 12/07/14 0223 12/07/14 0400 12/07/14 0423  BP: 108/62 101/60 114/65   Pulse:      Temp: 99.2 F (37.3 C)  98.8 F (37.1 C)   TempSrc: Oral  Oral   Resp: 24 21 21    Height:      Weight:    71.3 kg (157 lb 3 oz)  SpO2: 94% 97% 98%     Intake/Output Summary (Last 24 hours) at 12/07/14 0807 Last data filed at 12/07/14 0700  Gross per 24 hour  Intake   5255 ml  Output   3150 ml  Net   2105 ml    Exam:   General:  Pt is alert, follows commands appropriately, not in acute distress  Cardiovascular: Regular rate and rhythm, no rubs, no gallops  Respiratory: Clear to auscultation bilaterally, no wheezing, no crackles, no rhonchi  Abdomen: Soft, tender in epigastric area, non distended, bowel sounds present, no guarding  Extremities: No edema, pulses DP and PT palpable bilaterally  Data Reviewed: Basic Metabolic Panel:  Recent Labs Lab 12/01/14 1500 12/02/14 0300 12/03/14 0400 12/04/14 0346 12/04/14 0744 12/05/14 0356 12/05/14 1600 12/06/14 0400 12/06/14 1615 12/07/14 0415  NA 141 140 138  --  140 139  --  138  --  139  K 3.4* 3.5* 3.2*  --  2.9* 2.8* 3.2* 2.9* 3.6 3.9  CL 112 109 102  --  99 104  --  108  --  114*  CO2 17* 23 27  --  27 28  --  24  --  21  GLUCOSE 199* 164* 155*  --  127* 164*  --  230*  --  230*  BUN 20 15 13   --  16 15  --  19  --  16  CREATININE 0.80 0.78 0.68  --  0.70 0.72  --  0.73  --  0.62  CALCIUM 7.0* 7.7* 7.9*  --  8.5 7.7*  --  7.7*  --  7.4*  MG 1.2* 1.8 1.7 1.6  --  1.4*  --  1.4* 1.9 1.6  PHOS 2.0* 1.9*  2.3 3.8  --  3.1  --   --   --   --    Liver Function Tests:  Recent Labs Lab 11/30/14 1830 12/07/14 0415  AST 97* 17  ALT 97* 19  ALKPHOS 47 78  BILITOT 1.8* 0.7  PROT 4.0* 4.5*  ALBUMIN 2.1* 1.6*   CBC:  Recent Labs Lab 11/30/14 1145  12/01/14 1500  12/03/14 0400  12/04/14 1650 12/05/14 12/05/14 0356 12/06/14 0400 12/07/14 0415  WBC 2.6*  < > 3.2*  < > 2.8*  < > 3.6* 4.7 4.1 4.4 6.6  NEUTROABS 1.7  --  2.5  --  2.2  --   --   --   --   --   --  HGB 7.5*  < > 8.5*  < > 7.9*  < > 8.6* 8.4* 8.2* 8.6* 8.1*  HCT 22.9*  < > 25.0*  < > 23.1*  < > 26.6* 26.5* 26.1* 27.0* 26.1*  MCV 87.7  < > 86.8  < > 88.2  < > 90.2 90.8 90.9 90.0 90.9  PLT 80*  < > 71*  < > 59*  < > 85* 99* 101* 118* 125*  < > = values in this interval not displayed.  CBG:  Recent Labs Lab 12/06/14 1202 12/06/14 1652 12/06/14 2000 12/06/14 2321 12/07/14 0349  GLUCAP 214* 206* 192* 157* 189*    Recent Results (from the past 240 hour(s))  MRSA PCR Screening     Status: None   Collection Time: 11/29/14  9:03 AM  Result Value Ref Range Status   MRSA by PCR NEGATIVE NEGATIVE Final    Comment:        The GeneXpert MRSA Assay (FDA approved for NASAL specimens only), is one component of a comprehensive MRSA colonization surveillance program. It is not intended to diagnose MRSA infection nor to guide or monitor treatment for MRSA infections.   Culture, blood (routine x 2)     Status: None (Preliminary result)   Collection Time: 12/03/14  9:54 AM  Result Value Ref Range Status   Specimen Description BLOOD RIGHT HAND  Final   Special Requests   Final    BOTTLES DRAWN AEROBIC AND ANAEROBIC 10CC BOTH BOTTLES   Culture  Setup Time   Final    12/03/2014 16:36 Performed at [facility]    Culture   Final           BLOOD CULTURE RECEIVED NO GROWTH TO DATE CULTURE WILL BE HELD FOR 5 DAYS BEFORE ISSUING A FINAL NEGATIVE REPORT Performed at [facility]    Report Status PENDING   Incomplete  Culture, blood (routine x 2)     Status: None (Preliminary result)   Collection Time: 12/03/14 10:11 AM  Result Value Ref Range Status   Specimen Description BLOOD LEFT HAND  Final   Special Requests   Final    BOTTLES DRAWN AEROBIC AND ANAEROBIC 10CC BOTH BOTTLES   Culture  Setup Time   Final    12/03/2014 16:36 Performed at [facility]    Culture   Final           BLOOD CULTURE RECEIVED NO GROWTH TO DATE CULTURE WILL BE HELD FOR 5 DAYS BEFORE ISSUING A FINAL NEGATIVE REPORT Performed at [facility]    Report Status PENDING  Incomplete  Clostridium Difficile by PCR     Status: Abnormal   Collection Time: 12/04/14  6:23 PM  Result Value Ref Range Status   C difficile by pcr POSITIVE (A) NEGATIVE Final    Comment: CRITICAL RESULT CALLED TO, READ BACK BY AND VERIFIED WITH: [email or web address],RN 62/70/35 0093 BY BSLADE Performed at [facility]      Scheduled Meds: . budesonide-formoterol  2 puff Inhalation BID  . chlorhexidine  15 mL Mouth Rinse BID  . insulin aspart  0-15 Units Subcutaneous 6 times per day  . magnesium sulfate 1 - 4 g bolus IVPB  4 g Intravenous Once  . pantoprazole (PROTONIX) IV  40 mg Intravenous Q12H  . potassium chloride  40 mEq Per Tube TID  . sodium chloride  10-40 mL Intracatheter Q12H  . sodium chloride  3 mL Intravenous Q12H  . vancomycin  125 mg  Per Tube 4 times per day   Continuous Infusions: . sodium chloride 10 mL/hr (12/06/14 1949)  . sodium chloride 10 mL/hr at 12/05/14 0217  . feeding supplement (GLUCERNA 1.2 CAL) 1,000 mL (12/06/14 2107)  . sodium chloride 0.9 % 1,000 mL with potassium chloride 40 mEq infusion 125 mL/hr at 12/07/14 0025

## 2014-12-07 NOTE — Progress Notes (Signed)
[address] [place], [place] [address] Phone: [phone] FAX: [phone]    Daryl Vega [id number] 1946/02/17  CARE TEAM:  PCP: [name], MD  Outpatient Care Team: Patient Care Team: [name], MD as PCP - General (Family Medicine)  Inpatient Treatment Team: Treatment Team: Attending Provider: Theodis Blaze, MD; Consulting Physician: Curt Bears, MD; Consulting Physician: Nolon Nations, MD; Registered Nurse: Lady Gary, RN; Registered Nurse: Peace D Dormon, RN; Rounding Team: Ian Bushman, MD; Registered Nurse: Carl Best, RN; Physical Therapist: Mathis Fare, PT   Subjective:  "My ass hurts" Abd sore but not severe  Objective:  Vital signs:  Filed Vitals:   12/07/14 0000 12/07/14 0223 12/07/14 0400 12/07/14 0423  BP: 108/62 101/60 114/65   Pulse:      Temp: 99.2 F (37.3 C)  98.8 F (37.1 C)   TempSrc: Oral  Oral   Resp: $Remo'24 21 21   'qbRed$ Height:      Weight:    157 lb 3 oz (71.3 kg)  SpO2: 94% 97% 98%     Last BM Date: 12/07/14  Intake/Output   Yesterday:  12/23 0701 - 12/24 0700 In: 5405 [P.O.:510; I.V.:2735; NG/GT:2160] Out: 3150 [Urine:525; Drains:1825; Stool:800] This shift:     Bowel function:  Flatus: y  BM: loose w rectal tube  Drain: serous JP drain.  D tube bilious  Physical Exam:  General: Pt awake/alert/oriented x4 in no acute distress.  Cachectic Eyes: PERRL, normal EOM.  Sclera clear.  No icterus Neuro: CN II-XII intact w/o focal sensory/motor deficits. Lymph: No head/neck/groin lymphadenopathy Psych:  No delerium/psychosis/paranoia HENT: Normocephalic, Mucus membranes moist.  No thrush Neck: Supple, No tracheal deviation Chest: No chest wall pain w good excursion CV:  Pulses intact.  Regular rhythm MS: Normal AROM mjr joints.  No obvious deformity Abdomen: Soft.  Mold/mod distended.  Mildly tender at incisions only.  No evidence of peritonitis.  No  incarcerated hernias. Ext:  SCDs BLE.  No mjr edema.  No cyanosis Skin: No petechiae / purpura   Problem List:   Principal Problem:   Acute respiratory failure Active Problems:   Anemia   GI bleed   Protein-calorie malnutrition, severe   Cholecystoduodenal fistula   Bleeding duodenal ulcer   Acute respiratory failure with hypoxia   Absolute anemia   Lung cancer, main bronchus   Hyperglycemia   Assessment  R Spencer Lupu  68 y.o. male  7 Days Post-Op  Procedure(s): ESOPHAGOGASTRODUODENOSCOPY (EGD)  1. Bleeding duodenal ulcer with cholecystoduodenal fistula  -EGD for control of bleeding 11/30/16 [name] -POD #5 Emergency Ex Lap, oversewing of posterior bleeding DU, Heineke Mikulicz pyloroplasty, cholecystectomy, repair of right hepatic artery, repair of cholecystoduodenal fistula and placement of duodenostomy tube, feeding jejunostomy. 11/30/14, Dr. Jackolyn Confer   Severe protein cal malnutrition - on Tube feedings at goal of 37mL/hr   Hypokalemia - 2.9.   K+ supplements   Hypomagnesemia 1.4 - MORE MAG replacement to help K replaceemnt.     C.Diff positive - changing to oral vancomycin  Try to control diarrhea with pepto BID.  Prob TF & Cdiff combo.  Maybe d/c rectal tube soon if better controlled  Check JP Blake drain fluid for amylase & creatinine.  If both WNL, d/c Blake drain.  -anemia stable - follow   Tachycardia/hypotension.  Support in SDU/ICU   SCD for DVT, he has not been on heparin because of the bleed.  mobilize as tolerated to help recovery   Non small cell lung carcinoma, Stage IIIB (T3, N3, M0) undergoing chemotherapy and radiation therapy. (07/2014)   AODM - SSI   Hx of Melanoma   Dyslipidemia  Adin Hector, M.D., F.A.C.S. Gastrointestinal and [facility]imally Invasive Surgery [facility] [address], [place], [place] [address] [phone] Main / Paging   12/07/2014   Results:    Labs: Results for orders placed or performed during the hospital encounter of 11/29/14 (from the past 48 hour(s))  Glucose, capillary     Status: Abnormal   Collection Time: 12/05/14  8:31 AM  Result Value Ref Range   Glucose-Capillary 183 (H) 70 - 99 mg/dL  Glucose, capillary     Status: Abnormal   Collection Time: 12/05/14 12:28 PM  Result Value Ref Range   Glucose-Capillary 169 (H) 70 - 99 mg/dL  Potassium     Status: Abnormal   Collection Time: 12/05/14  4:00 PM  Result Value Ref Range   Potassium 3.2 (L) 3.5 - 5.1 mmol/L    Comment: Please note change in reference range.  Glucose, capillary     Status: Abnormal   Collection Time: 12/05/14  4:49 PM  Result Value Ref Range   Glucose-Capillary 169 (H) 70 - 99 mg/dL  Glucose, capillary     Status: Abnormal   Collection Time: 12/05/14  7:43 PM  Result Value Ref Range   Glucose-Capillary 169 (H) 70 - 99 mg/dL  Glucose, capillary     Status: Abnormal   Collection Time: 12/05/14 11:43 PM  Result Value Ref Range   Glucose-Capillary 183 (H) 70 - 99 mg/dL   Comment 1 Documented in Chart    Comment 2 Notify RN   CBC     Status: Abnormal   Collection Time: 12/06/14  4:00 AM  Result Value Ref Range   WBC 4.4 4.0 - 10.5 K/uL   RBC 3.00 (L) 4.22 - 5.81 MIL/uL   Hemoglobin 8.6 (L) 13.0 - 17.0 g/dL   HCT 27.0 (L) 39.0 - 52.0 %   MCV 90.0 78.0 - 100.0 fL   MCH 28.7 26.0 - 34.0 pg   MCHC 31.9 30.0 - 36.0 g/dL   RDW 17.9 (H) 11.5 - 15.5 %   Platelets 118 (L) 150 - 400 K/uL    Comment: CONSISTENT WITH PREVIOUS RESULT  Basic metabolic panel     Status: Abnormal   Collection Time: 12/06/14  4:00 AM  Result Value Ref Range   Sodium 138 135 - 145 mmol/L    Comment: Please note change in reference range.   Potassium 2.9 (L) 3.5 - 5.1 mmol/L    Comment: Please note change in reference range.   Chloride 108 96 - 112 mEq/L   CO2 24 19 - 32 mmol/L   Glucose, Bld 230 (H) 70 - 99 mg/dL   BUN 19 6 - 23 mg/dL   Creatinine, Ser 0.73 0.50 -  1.35 mg/dL   Calcium 7.7 (L) 8.4 - 10.5 mg/dL   GFR calc non Af Amer >90 >90 mL/min   GFR calc Af Amer >90 >90 mL/min    Comment: (NOTE) The eGFR has been calculated using the CKD EPI equation. This calculation has not been validated in all clinical situations. eGFR's persistently <90 mL/min signify possible Chronic Kidney Disease.    Anion gap 6 5 - 15  Magnesium     Status: Abnormal   Collection Time: 12/06/14  4:00 AM  Result  Value Ref Range   Magnesium 1.4 (L) 1.5 - 2.5 mg/dL  Glucose, capillary     Status: Abnormal   Collection Time: 12/06/14  4:03 AM  Result Value Ref Range   Glucose-Capillary 218 (H) 70 - 99 mg/dL   Comment 1 Notify RN   Glucose, capillary     Status: Abnormal   Collection Time: 12/06/14  8:17 AM  Result Value Ref Range   Glucose-Capillary 200 (H) 70 - 99 mg/dL  Glucose, capillary     Status: Abnormal   Collection Time: 12/06/14 12:02 PM  Result Value Ref Range   Glucose-Capillary 214 (H) 70 - 99 mg/dL  Magnesium     Status: None   Collection Time: 12/06/14  4:15 PM  Result Value Ref Range   Magnesium 1.9 1.5 - 2.5 mg/dL  Potassium     Status: None   Collection Time: 12/06/14  4:15 PM  Result Value Ref Range   Potassium 3.6 3.5 - 5.1 mmol/L    Comment: Please note change in reference range. REPEATED TO VERIFY DELTA CHECK NOTED NO VISIBLE HEMOLYSIS   Prealbumin     Status: Abnormal   Collection Time: 12/06/14  4:15 PM  Result Value Ref Range   Prealbumin 7.3 (L) 17.0 - 34.0 mg/dL    Comment: Performed at [facility]  Glucose, capillary     Status: Abnormal   Collection Time: 12/06/14  4:52 PM  Result Value Ref Range   Glucose-Capillary 206 (H) 70 - 99 mg/dL  Glucose, capillary     Status: Abnormal   Collection Time: 12/06/14  8:00 PM  Result Value Ref Range   Glucose-Capillary 192 (H) 70 - 99 mg/dL  Glucose, capillary     Status: Abnormal   Collection Time: 12/06/14 11:21 PM  Result Value Ref Range   Glucose-Capillary 157 (H) 70  - 99 mg/dL   Comment 1 Documented in Chart    Comment 2 Notify RN   Glucose, capillary     Status: Abnormal   Collection Time: 12/07/14  3:49 AM  Result Value Ref Range   Glucose-Capillary 189 (H) 70 - 99 mg/dL   Comment 1 Documented in Chart    Comment 2 Notify RN   CBC     Status: Abnormal   Collection Time: 12/07/14  4:15 AM  Result Value Ref Range   WBC 6.6 4.0 - 10.5 K/uL   RBC 2.87 (L) 4.22 - 5.81 MIL/uL   Hemoglobin 8.1 (L) 13.0 - 17.0 g/dL   HCT 26.1 (L) 39.0 - 52.0 %   MCV 90.9 78.0 - 100.0 fL   MCH 28.2 26.0 - 34.0 pg   MCHC 31.0 30.0 - 36.0 g/dL   RDW 18.3 (H) 11.5 - 15.5 %   Platelets 125 (L) 150 - 400 K/uL  Comprehensive metabolic panel     Status: Abnormal   Collection Time: 12/07/14  4:15 AM  Result Value Ref Range   Sodium 139 135 - 145 mmol/L    Comment: Please note change in reference range.   Potassium 3.9 3.5 - 5.1 mmol/L    Comment: Please note change in reference range.   Chloride 114 (H) 96 - 112 mEq/L   CO2 21 19 - 32 mmol/L   Glucose, Bld 230 (H) 70 - 99 mg/dL   BUN 16 6 - 23 mg/dL   Creatinine, Ser 0.62 0.50 - 1.35 mg/dL   Calcium 7.4 (L) 8.4 - 10.5 mg/dL   Total Protein 4.5 (L) 6.0 -  8.3 g/dL   Albumin 1.6 (L) 3.5 - 5.2 g/dL   AST 17 0 - 37 U/L   ALT 19 0 - 53 U/L   Alkaline Phosphatase 78 39 - 117 U/L   Total Bilirubin 0.7 0.3 - 1.2 mg/dL   GFR calc non Af Amer >90 >90 mL/min   GFR calc Af Amer >90 >90 mL/min    Comment: (NOTE) The eGFR has been calculated using the CKD EPI equation. This calculation has not been validated in all clinical situations. eGFR's persistently <90 mL/min signify possible Chronic Kidney Disease.    Anion gap 4 (L) 5 - 15  Magnesium     Status: None   Collection Time: 12/07/14  4:15 AM  Result Value Ref Range   Magnesium 1.6 1.5 - 2.5 mg/dL    Imaging / Studies: No results found.  Medications / Allergies: per chart  Antibiotics: Anti-infectives    Start     Dose/Rate Route Frequency Ordered Stop    12/06/14 0800  vancomycin (VANCOCIN) 50 mg/mL oral solution 125 mg     125 mg Per Tube 4 times per day 12/06/14 0706     12/06/14 0715  vancomycin (VANCOCIN) 50 mg/mL oral solution 125 mg  Status:  Discontinued     125 mg Oral 4 times per day 12/06/14 0704 12/06/14 0706   12/05/14 1100  metroNIDAZOLE (FLAGYL) 50 mg/ml oral suspension 500 mg  Status:  Discontinued     500 mg Per Tube 3 times daily 12/05/14 0941 12/06/14 0704   12/03/14 2000  vancomycin (VANCOCIN) IVPB 750 mg/150 ml premix  Status:  Discontinued     750 mg150 mL/hr over 60 Minutes Intravenous Every 8 hours 12/03/14 1000 12/05/14 0929   12/03/14 1800  piperacillin-tazobactam (ZOSYN) IVPB 3.375 g  Status:  Discontinued     3.375 g12.5 mL/hr over 240 Minutes Intravenous Every 8 hours 12/03/14 1000 12/05/14 0929   12/03/14 1100  vancomycin (VANCOCIN) IVPB 750 mg/150 ml premix     750 mg150 mL/hr over 60 Minutes Intravenous  Once 12/03/14 0959 12/03/14 1128   12/03/14 1100  piperacillin-tazobactam (ZOSYN) IVPB 3.375 g     3.375 g100 mL/hr over 30 Minutes Intravenous  Once 12/03/14 0959 12/03/14 1257   11/30/14 2000  ceFAZolin (ANCEF) IVPB 1 g/50 mL premix     1 g100 mL/hr over 30 Minutes Intravenous Every 6 hours 11/30/14 1738 12/01/14 0842   11/30/14 1400  ceFAZolin (ANCEF) IVPB 2 g/50 mL premix  Status:  Discontinued    Comments:  Stat to OR>   2 g100 mL/hr over 30 Minutes Intravenous  Once 11/30/14 1348 11/30/14 1659       Note: Portions of this report may have been transcribed using voice recognition software. Every effort was made to ensure accuracy; however, inadvertent computerized transcription errors may be present.   Any transcriptional errors that result from this process are unintentional.

## 2014-12-08 LAB — CBC
HCT: 25 % — ABNORMAL LOW (ref 39.0–52.0)
Hemoglobin: 8 g/dL — ABNORMAL LOW (ref 13.0–17.0)
MCH: 29.1 pg (ref 26.0–34.0)
MCHC: 32 g/dL (ref 30.0–36.0)
MCV: 90.9 fL (ref 78.0–100.0)
Platelets: 128 10*3/uL — ABNORMAL LOW (ref 150–400)
RBC: 2.75 MIL/uL — ABNORMAL LOW (ref 4.22–5.81)
RDW: 18.4 % — ABNORMAL HIGH (ref 11.5–15.5)
WBC: 6.4 10*3/uL (ref 4.0–10.5)

## 2014-12-08 LAB — BASIC METABOLIC PANEL
Anion gap: 6 (ref 5–15)
BUN: 13 mg/dL (ref 6–23)
CO2: 17 mmol/L — ABNORMAL LOW (ref 19–32)
Calcium: 7.3 mg/dL — ABNORMAL LOW (ref 8.4–10.5)
Chloride: 112 mEq/L (ref 96–112)
Creatinine, Ser: 0.6 mg/dL (ref 0.50–1.35)
GFR calc Af Amer: >90 mL/min (ref >90)
GFR calc non Af Amer: >90 mL/min (ref >90)
Glucose, Bld: 179 mg/dL — ABNORMAL HIGH (ref 70–99)
Potassium: 4.4 mmol/L (ref 3.5–5.1)
Sodium: 135 mmol/L (ref 135–145)

## 2014-12-08 LAB — GLUCOSE, CAPILLARY
GLUCOSE-CAPILLARY: 171 mg/dL — AB (ref 70–99)
Glucose-Capillary: 158 mg/dL — ABNORMAL HIGH (ref 70–99)
Glucose-Capillary: 136 mg/dL — ABNORMAL HIGH (ref 70–99)
Glucose-Capillary: 105 mg/dL — ABNORMAL HIGH (ref 70–99)
Glucose-Capillary: 143 mg/dL — ABNORMAL HIGH (ref 70–99)

## 2014-12-08 LAB — MAGNESIUM: Magnesium: 1.6 mg/dL (ref 1.5–2.5)

## 2014-12-08 MED ORDER — MAGNESIUM SULFATE 2 GM/50ML IV SOLN
2.0000 g | Freq: Once | INTRAVENOUS | Status: AC
  Administered 2014-12-08: 2 g via INTRAVENOUS
  Filled 2014-12-08: qty 50

## 2014-12-08 MED ORDER — LACTATED RINGERS IV SOLN
INTRAVENOUS | Status: DC
  Administered 2014-12-10: 21:00:00 via INTRAVENOUS
  Administered 2014-12-10: 125 mL/h via INTRAVENOUS
  Administered 2014-12-11 – 2014-12-12 (×3): via INTRAVENOUS

## 2014-12-08 NOTE — Progress Notes (Signed)
Pt was only able to tolerate sitting up in chair for 45 minutes.  Pt stated that his bottom was hurting too much and needed to get back to bed.

## 2014-12-08 NOTE — Progress Notes (Signed)
Patient ID: Daryl Vega, male   DOB: Jul 30, 1946, 68 y.o.   MRN: [id number]  [facility] PROGRESS NOTE  Daryl Vega [facility] RKY:[id number] DOB: Oct 12, 1946 DOA: 11/29/2014 PCP: [name], MD   Brief narrative: 68 y/o male admitted on 12/16 with a GI bleed. Underwent endoscopy on 12/17 and was found to have a bleeding duodenal ulcer which rapidly progressed and he was taken to the OR on 12/17 emergently.There, he had an emergency ex laparotomy, oversewing of a posterior bleeding duodenal ulcer, pyloroplasty, cholecystectomy, repair of R hepatic artery, repair of cholecystoduodenal fistula and placement of a duodenostomy tube and feeding J-tube. PCCM consulted for post op ventilator management.  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 Pneumonia 12/21 dark stool, Hgb stable Dec 14, 2023 no more dark stool, c.diff pcr positive  Assessment and Plan:   Acute respiratory failure with hypoxemia - atelectasis, resolved  Stage IIIB Squamous Cell Lung cancer - dx 07/2014, Rx carboplatin and AUC 08/2014 - VDRF, now extubated (since 12/18), respiratory status stable  - no fever and no leukocytosis  - continue pulm hygiene: IS, mobilize as able to tolerate  - O2 as needed for O2 saturation > 90% - continue home symbicort + xopenex PRN  CVL R IJ Cordis 12/17 >>12/20 PICC 12/21 >> - with HTN and episodes of tachycardia - still tachy but no chest pain or shortness of breath  - provide PRN labetalol   Metabolic acidosis - improving  Hypokalemia  - with hypomagnesemia - K and Mg supplemented, K is WNL but Mg is on low end of normal 1.6 (will give 2 gm IV Mg)  - repeat  BMP and Mg level in AM  Acute GI Bleeding - d/t bleeding DU and 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 Diff - EGD for control of bleeding 11/30/16 [name] - POD #6 Emergency Ex Lap, oversewing of posterior bleeding DU, Heineke Mikulicz pyloroplasty, cholecystectomy, repair of right hepatic artery, repair of cholecystoduodenal fistula, placement of duodenostomy tube, feeding jejunostomy. 11/30/14, [name]  - NPO x ice chips - Tube Feeding and Post op care per Gen surgery  Massive GI bleed from duodenal ulcer > 2023/12/14 no active bleeding Acute Blood Loss Anemia  Thrombocytopenia - improving - Transfuse for hgb < 7gm/dL - Hg remains stable for now ~ 8 - continue PPI  C.diff? Not clear he actually has c diff colitis as 20% or more of hospitalized patients are colonized No HCAP - continue oral vancomycin   DM2  - SSI  Pain - Post op abd surgery Baseline Benzo Dependent - temazepam QHS + lorazepam PRN Oversedated 2023-12-14 from dilaudid - PRN analgesia > changed to dilaudid 12/19, decrease dose   DVT prophylaxis - SCD's  Code Status: Full Family Communication: Pt at bedside Disposition Plan: transfer to telemetry bed due to tachycardia   IV Access:    Peripheral IV Procedures and diagnostic studies:    Dg Chest Port 1 View 12-13-2014 Right upper lobe and left lower lobe infiltrate. Volume loss right upper lobe, stable. No change in cardiac silhouette. Tube and  catheter positions unchanged. No pneumothorax.   Dg Chest Port 1 View 12/04/2014 Tube and catheter positions as described without pneumothorax. Volume loss with patchy infiltrate right upper lobe. Focal area of infiltrate left base as well.  Medical Consultants:    Surgery  Other Consultants:     None  Anti-Infectives/microbiology data:    Oral Vancomycin   Blood culture 12/20 >   C.diff pcr 12/22> positive  Vanc 12/20 >12/22  Zosyn 12/20 > 12/22  Flagyl 12/22 > current plan is for 14 days, but if he doesn't have fever or leukocytosis, consider stopping as this may just be colonization   [name], MD  [facility] Pager [phone]  If 7PM-7AM, please contact night-coverage [email or web address] Password [facility] 12/08/2014, 2:27 PM   LOS: 9 days   HPI/Subjective: No events overnight.   Objective: Filed Vitals:   12/08/14 0400 12/08/14 0800 12/08/14 1058 12/08/14 1138  BP: 109/61 103/57 105/71   Pulse: 112     Temp: 97.1 F (36.2 C) 98.9 F (37.2 C) 98.9 F (37.2 C)   TempSrc: Oral Oral Oral   Resp: 31 26 22    Height:      Weight:      SpO2: 93% 94% 97% 97%    Intake/Output Summary (Last 24 hours) at 12/08/14 1427 Last data filed at 12/08/14 1344  Gross per 24 hour  Intake   3890 ml  Output   4540 ml  Net   -650 ml    Exam:   General:  Pt is alert, follows commands appropriately, not in acute distress  Cardiovascular: Regular rhythm, tachy, S1/S2, no murmurs, no rubs, no gallops  Respiratory: Clear to auscultation bilaterally, no wheezing, no crackles, no rhonchi  Abdomen: Soft, non tender, non distended, no guarding  Extremities: No edema, pulses DP and PT palpable bilaterally  Neuro: Grossly nonfocal  Data Reviewed: Basic Metabolic Panel:  Recent Labs Lab 12/01/14 1500 12/02/14 0300 12/03/14 0400 12/04/14 0346 12/04/14 0744 12/05/14 0356 12/05/14 1600 12/06/14 0400 12/06/14 1615 12/07/14 0415 12/08/14 0356  NA 141 140 138  --  140 139  --  138  --  139 135  K 3.4* 3.5* 3.2*  --  2.9* 2.8* 3.2* 2.9* 3.6 3.9 4.4  CL 112 109 102  --  99 104  --  108  --  114* 112  CO2 17* 23 27  --  27 28  --  24  --  21 17*  GLUCOSE 199* 164* 155*  --  127* 164*  --  230*  --  230* 179*  BUN 20 15 13   --  16 15  --  19  --  16 13   CREATININE 0.80 0.78 0.68  --  0.70 0.72  --  0.73  --  0.62 0.60  CALCIUM 7.0* 7.7* 7.9*  --  8.5 7.7*  --  7.7*  --  7.4* 7.3*  MG 1.2* 1.8 1.7 1.6  --  1.4*  --  1.4* 1.9 1.6 1.6  PHOS 2.0* 1.9* 2.3 3.8  --  3.1  --   --   --   --   --    Liver Function Tests:  Recent Labs Lab 12/07/14 0415  AST 17  ALT 19  ALKPHOS 78  BILITOT 0.7  PROT 4.5*  ALBUMIN 1.6*    Recent Labs Lab 12/07/14 0415  AMYLASE 76   CBC:  Recent Labs Lab 12/01/14 1500  12/03/14 0400  12/05/14 12/05/14 0356 12/06/14 0400 12/07/14 0415 12/08/14  0356  WBC 3.2*  < > 2.8*  < > 4.7 4.1 4.4 6.6 6.4  NEUTROABS 2.5  --  2.2  --   --   --   --   --   --   HGB 8.5*  < > 7.9*  < > 8.4* 8.2* 8.6* 8.1* 8.0*  HCT 25.0*  < > 23.1*  < > 26.5* 26.1* 27.0* 26.1* 25.0*  MCV 86.8  < > 88.2  < > 90.8 90.9 90.0 90.9 90.9  PLT 71*  < > 59*  < > 99* 101* 118* 125* 128*  < > = values in this interval not displayed.  CBG:  Recent Labs Lab 12/07/14 1929 12/07/14 2342 12/08/14 0349 12/08/14 0738 12/08/14 1214  GLUCAP 142* 149* 158* 171* 136*    Recent Results (from the past 240 hour(s))  MRSA PCR Screening     Status: None   Collection Time: 11/29/14  9:03 AM  Result Value Ref Range Status   MRSA by PCR NEGATIVE NEGATIVE Final    Comment:        The GeneXpert MRSA Assay (FDA approved for NASAL specimens only), is one component of a comprehensive MRSA colonization surveillance program. It is not intended to diagnose MRSA infection nor to guide or monitor treatment for MRSA infections.   Culture, blood (routine x 2)     Status: None (Preliminary result)   Collection Time: 12/03/14  9:54 AM  Result Value Ref Range Status   Specimen Description BLOOD RIGHT HAND  Final   Special Requests   Final    BOTTLES DRAWN AEROBIC AND ANAEROBIC 10CC BOTH BOTTLES   Culture  Setup Time   Final    12/03/2014 16:36 Performed at [facility]    Culture   Final           BLOOD CULTURE RECEIVED NO GROWTH TO  DATE CULTURE WILL BE HELD FOR 5 DAYS BEFORE ISSUING A FINAL NEGATIVE REPORT Performed at [facility]    Report Status PENDING  Incomplete  Culture, blood (routine x 2)     Status: None (Preliminary result)   Collection Time: 12/03/14 10:11 AM  Result Value Ref Range Status   Specimen Description BLOOD LEFT HAND  Final   Special Requests   Final    BOTTLES DRAWN AEROBIC AND ANAEROBIC 10CC BOTH BOTTLES   Culture  Setup Time   Final    12/03/2014 16:36 Performed at [facility]    Culture   Final           BLOOD CULTURE RECEIVED NO GROWTH TO DATE CULTURE WILL BE HELD FOR 5 DAYS BEFORE ISSUING A FINAL NEGATIVE REPORT Performed at [facility]    Report Status PENDING  Incomplete  Clostridium Difficile by PCR     Status: Abnormal   Collection Time: 12/04/14  6:23 PM  Result Value Ref Range Status   C difficile by pcr POSITIVE (A) NEGATIVE Final    Comment: CRITICAL RESULT CALLED TO, READ BACK BY AND VERIFIED WITH: A.[email or web address],RN 99/35/70 1779 BY BSLADE Performed at [facility]      Scheduled Meds: . bismuth subsalicylate  30 mL Per Tube BID  . budesonide-formoterol  2 puff Inhalation BID  . chlorhexidine  15 mL Mouth Rinse BID  . insulin aspart  0-15 Units Subcutaneous 6 times per day  . pantoprazole (PROTONIX) IV  40 mg Intravenous Q12H  . potassium chloride  40 mEq Per Tube TID  . sodium  chloride  10-40 mL Intracatheter Q12H  . sodium chloride  3 mL Intravenous Q12H  . vancomycin  125 mg Per Tube 4 times per day   Continuous Infusions: . sodium chloride 10 mL/hr (12/06/14 1949)  . sodium chloride 10 mL/hr at 12/05/14 0217  . feeding supplement (GLUCERNA 1.2 CAL) 1,000 mL (12/07/14 1002)  . sodium chloride 0.9 % 1,000 mL with potassium chloride 40 mEq infusion 125 mL/hr at 12/08/14 [id number]

## 2014-12-08 NOTE — Progress Notes (Signed)
General Surgery Note  LOS: 9 days  POD -  8 Days Post-Op  Assessment/Plan: 1. Bleeding duodenal ulcer with cholecystoduodenal fistula  EGD for control of bleeding 11/30/16 [name]  Ex Lap, oversewing of posterior bleeding DU, Heineke Mikulicz pyloroplasty, cholecystectomy, repair of right hepatic artery, repair of cholecystoduodenal fistula and placement of duodenostomy tube, feeding jejunostomy. 11/30/14, Dr. Jackolyn Confer  1A.  Duodenostomy tube - 750 cc recorded last 24 hours 1B.  Blake drain - amylase and creatinine normal  Dr. Johney Maine mentioned about removing this - but I am going to leave it for now  2.  Malnutrition  On TF through J tube 3.  C. Diff  On vancomycin 4.  Anemia - Hgb - 8.0 - 12/08/2014 5.  Non small cell lung carcinoma, Stage IIIB (T3, N3, M0) undergoing chemotherapy and radiation therapy. (07/2014)  6.  AODM - SSI  BS - 179 - 12/08/2014 7.  DVT prophylaxis - SCD  (chemoprophylaxis on hold secondary to GI bleed)  Principal Problem:   Acute respiratory failure Active Problems:   Anemia   GI bleed   Protein-calorie malnutrition, severe   Cholecystoduodenal fistula   Bleeding duodenal ulcer   Acute respiratory failure with hypoxia   Absolute anemia   Lung cancer, main bronchus   Hyperglycemia  Subjective:  Grumpy, but actually looks pretty good.  Having loose stools. Son, Zenia Resides, in room with patient. Objective:   Filed Vitals:   12/08/14 0400  BP: 109/61  Pulse: 112  Temp: 97.1 F (36.2 C)  Resp: 31     Intake/Output from previous day:  12/24 0701 - 12/25 0700 In: 5476.8 [I.V.:3731.8; NG/GT:1495; IV Piggyback:100] Out: 0254 [Urine:1400; Drains:1040; Stool:200]  Intake/Output this shift:      Physical Exam:   General: WN WM who is alert and oriented.    HEENT: Normal. Pupils equal. .   Lungs: Clear.   Abdomen: Soft.  BS present.  No tenderness   Wound: Incision looks good.  Duodenostomy tube RUQ.  Blake drain right mid abdomen.   J tube LUQ.     Lab Results:    Recent Labs  12/07/14 0415 12/08/14 0356  WBC 6.6 6.4  HGB 8.1* 8.0*  HCT 26.1* 25.0*  PLT 125* 128*    BMET   Recent Labs  12/07/14 0415 12/08/14 0356  NA 139 135  K 3.9 4.4  CL 114* 112  CO2 21 17*  GLUCOSE 230* 179*  BUN 16 13  CREATININE 0.62 0.60  CALCIUM 7.4* 7.3*    PT/INR  No results for input(s): LABPROT, INR in the last 72 hours.  ABG  No results for input(s): PHART, HCO3 in the last 72 hours.  Invalid input(s): PCO2, PO2   Studies/Results:  No results found.   Anti-infectives:   Anti-infectives    Start     Dose/Rate Route Frequency Ordered Stop   12/06/14 0800  vancomycin (VANCOCIN) 50 mg/mL oral solution 125 mg     125 mg Per Tube 4 times per day 12/06/14 0706     12/06/14 0715  vancomycin (VANCOCIN) 50 mg/mL oral solution 125 mg  Status:  Discontinued     125 mg Oral 4 times per day 12/06/14 0704 12/06/14 0706   12/05/14 1100  metroNIDAZOLE (FLAGYL) 50 mg/ml oral suspension 500 mg  Status:  Discontinued     500 mg Per Tube 3 times daily 12/05/14 0941 12/06/14 0704   12/03/14 2000  vancomycin (VANCOCIN) IVPB 750 mg/150 ml premix  Status:  Discontinued     750 mg150 mL/hr over 60 Minutes Intravenous Every 8 hours 12/03/14 1000 12/05/14 0929   12/03/14 1800  piperacillin-tazobactam (ZOSYN) IVPB 3.375 g  Status:  Discontinued     3.375 g12.5 mL/hr over 240 Minutes Intravenous Every 8 hours 12/03/14 1000 12/05/14 0929   12/03/14 1100  vancomycin (VANCOCIN) IVPB 750 mg/150 ml premix     750 mg150 mL/hr over 60 Minutes Intravenous  Once 12/03/14 0959 12/03/14 1128   12/03/14 1100  piperacillin-tazobactam (ZOSYN) IVPB 3.375 g     3.375 g100 mL/hr over 30 Minutes Intravenous  Once 12/03/14 0959 12/03/14 1257   11/30/14 2000  ceFAZolin (ANCEF) IVPB 1 g/50 mL premix     1 g100 mL/hr over 30 Minutes Intravenous Every 6 hours 11/30/14 1738 12/01/14 0842   11/30/14 1400  ceFAZolin (ANCEF) IVPB 2 g/50 mL premix  Status:   Discontinued    Comments:  Stat to OR>   2 g100 mL/hr over 30 Minutes Intravenous  Once 11/30/14 1348 11/30/14 1659      Alphonsa Overall, MD, FACS Pager: [facility] Office: [phone] 12/08/2014

## 2014-12-09 LAB — CULTURE, BLOOD (ROUTINE X 2)
CULTURE: NO GROWTH
Culture: NO GROWTH

## 2014-12-09 LAB — GLUCOSE, CAPILLARY
GLUCOSE-CAPILLARY: 105 mg/dL — AB (ref 70–99)
GLUCOSE-CAPILLARY: 121 mg/dL — AB (ref 70–99)
Glucose-Capillary: 100 mg/dL — ABNORMAL HIGH (ref 70–99)
Glucose-Capillary: 123 mg/dL — ABNORMAL HIGH (ref 70–99)
Glucose-Capillary: 126 mg/dL — ABNORMAL HIGH (ref 70–99)
Glucose-Capillary: 126 mg/dL — ABNORMAL HIGH (ref 70–99)
Glucose-Capillary: 130 mg/dL — ABNORMAL HIGH (ref 70–99)

## 2014-12-09 LAB — CBC
HCT: 25.3 % — ABNORMAL LOW (ref 39.0–52.0)
HEMOGLOBIN: 7.9 g/dL — AB (ref 13.0–17.0)
MCH: 28.9 pg (ref 26.0–34.0)
MCHC: 31.2 g/dL (ref 30.0–36.0)
MCV: 92.7 fL (ref 78.0–100.0)
Platelets: 137 10*3/uL — ABNORMAL LOW (ref 150–400)
RBC: 2.73 MIL/uL — ABNORMAL LOW (ref 4.22–5.81)
RDW: 18.3 % — AB (ref 11.5–15.5)
WBC: 6.4 10*3/uL (ref 4.0–10.5)

## 2014-12-09 LAB — BASIC METABOLIC PANEL
ANION GAP: 4 — AB (ref 5–15)
BUN: 11 mg/dL (ref 6–23)
CO2: 18 mmol/L — ABNORMAL LOW (ref 19–32)
Calcium: 7.6 mg/dL — ABNORMAL LOW (ref 8.4–10.5)
Chloride: 116 mEq/L — ABNORMAL HIGH (ref 96–112)
Creatinine, Ser: 0.64 mg/dL (ref 0.50–1.35)
GLUCOSE: 155 mg/dL — AB (ref 70–99)
Potassium: 4.6 mmol/L (ref 3.5–5.1)
SODIUM: 138 mmol/L (ref 135–145)

## 2014-12-09 LAB — MAGNESIUM: MAGNESIUM: 1.5 mg/dL (ref 1.5–2.5)

## 2014-12-09 MED ORDER — BUDESONIDE-FORMOTEROL FUMARATE 160-4.5 MCG/ACT IN AERO: 2.0000 | INHALATION_SPRAY | Freq: Two times a day (BID) | RESPIRATORY_TRACT | Status: DC

## 2014-12-09 NOTE — Progress Notes (Signed)
Patient ID: Daryl Vega, male   DOB: [birth year], 68 y.o.   MRN: [id number]  [facility] PROGRESS NOTE  [name] [facility] GBT:[id number] DOB: [birth year] DOA: 11/29/2014 PCP: [name], MD  Brief narrative: 68 y/o male admitted on 12/16 with a GI bleed. Underwent endoscopy on 12/17 and was found to have a bleeding duodenal ulcer which rapidly progressed and he was taken to the OR on 12/17 emergently.There, he had an emergency ex laparotomy, oversewing of a posterior bleeding duodenal ulcer, pyloroplasty, cholecystectomy, repair of R hepatic artery, repair of cholecystoduodenal fistula and placement of a duodenostomy tube and feeding J-tube. PCCM consulted for post op ventilator management.  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 Pneumonia 12/21 dark stool, Hgb stable Jan 05, 2024 no more dark stool, c.diff pcr positive 12/25 - transferred from SDU to tele bed   Assessment and Plan:   Acute respiratory failure with hypoxemia - atelectasis, resolved  Stage IIIB Squamous Cell Lung cancer - dx 07/2014, Rx carboplatin and AUC 08/2014 - VDRF, now extubated (since 12/18), respiratory status stable  - no fever and no leukocytosis  - continue pulm hygiene: IS, OOB today  - O2 as needed for O2 saturation > 90% - continue home symbicort + xopenex PRN  CVL R IJ Cordis 12/17 >>12/20 PICC 12/21 >> - with HTN and episodes of tachycardia - still tachy with HR in 110 - 120's, no chest pain or shortness of breath  - continue PRN labetalol   Metabolic acidosis - improving  Hypokalemia  - with hypomagnesemia - K and Mg supplemented, WNL this AM  Acute GI Bleeding  - d/t bleeding DU and 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 Diff - EGD for control of bleeding 11/30/16 [name] - POD #7 Emergency Ex Lap, oversewing of posterior bleeding DU, Heineke Mikulicz pyloroplasty, cholecystectomy, repair of right hepatic artery, repair of cholecystoduodenal fistula, placement of duodenostomy tube, feeding jejunostomy. 11/30/14, [name]  - UGI on Monday per surgery   Massive GI bleed from duodenal ulcer > January 05, 2024 no active bleeding Acute Blood Loss Anemia  Thrombocytopenia - improving - Transfuse for hgb < 7gm/dL - Hg remains stable for now ~ 8 - continue PPI  C.diff? Not clear he actually has c diff colitis as 20% or more of hospitalized patients are colonized No HCAP - continue oral vancomycin   DM2  - SSI  Pain - Post op abd surgery Baseline Benzo Dependent - temazepam QHS + lorazepam PRN Oversedated 01/05/24 from dilaudid - PRN analgesia > changed to dilaudid 12/19, decrease dose   DVT prophylaxis - SCD's  Code Status: Full Family Communication: Pt at bedside Disposition Plan: remains inpatient   IV Access:    Peripheral IV Procedures and diagnostic studies:    Dg Chest Port 1 View January 04, 2015 Right upper lobe and left lower lobe infiltrate. Volume loss right upper lobe, stable. No change in cardiac silhouette. Tube and catheter positions unchanged. No pneumothorax.   Dg Chest Port 1 View 12/04/2014 Tube and catheter positions as described without pneumothorax. Volume loss with patchy infiltrate right  upper lobe. Focal area of infiltrate left base as well.  Medical Consultants:    Surgery  Other Consultants:    None  Anti-Infectives/microbiology data:    Oral Vancomycin (C. DIff +)   Blood culture 12/20  >   C.diff pcr 12/22> positive  [name], MD  [facility] Pager [phone]  If 7PM-7AM, please contact night-coverage [email or web address] Password [facility] 12/09/2014, 12:11 PM   LOS: 10 days   HPI/Subjective: No events overnight.   Objective: Filed Vitals:   12/08/14 1504 12/08/14 2057 12/08/14 2240 12/09/14 0621  BP: 118/77  102/64 102/68  Pulse: 120  109 112  Temp: 98.7 F (37.1 C)  99 F (37.2 C) 99.1 F (37.3 C)  TempSrc: Oral  Oral Oral  Resp: 22  20 20   Height:      Weight:      SpO2: 100% 97% 94% 98%    Intake/Output Summary (Last 24 hours) at 12/09/14 1211 Last data filed at 12/09/14 0956  Gross per 24 hour  Intake   4790 ml  Output   6040 ml  Net  -1250 ml    Exam:   General:  Pt is alert, follows commands appropriately, not in acute distress  Cardiovascular: Regular rhythm, tachycardic, S1/S2, no murmurs, no rubs, no gallops  Respiratory: Clear to auscultation bilaterally, no wheezing, no crackles, no rhonchi  Abdomen: Soft, non tender, non distended, no guarding  Extremities: No edema, pulses DP and PT palpable bilaterally  Neuro: Grossly nonfocal  Data Reviewed: Basic Metabolic Panel:  Recent Labs Lab 12/03/14 0400 12/04/14 0346  12/05/14 0356  12/06/14 0400 12/06/14 1615 12/07/14 0415 12/08/14 0356 12/09/14 0452  NA 138  --   < > 139  --  138  --  139 135 138  K 3.2*  --   < > 2.8*  < > 2.9* 3.6 3.9 4.4 4.6  CL 102  --   < > 104  --  108  --  114* 112 116*  CO2 27  --   < > 28  --  24  --  21 17* 18*  GLUCOSE 155*  --   < > 164*  --  230*  --  230* 179* 155*  BUN 13  --   < > 15  --  19  --  16 13 11   CREATININE 0.68  --   < > 0.72  --  0.73  --  0.62 0.60 0.64  CALCIUM 7.9*  --   < > 7.7*  --  7.7*  --  7.4* 7.3* 7.6*  MG 1.7 1.6  --  1.4*  --  1.4* 1.9 1.6 1.6 1.5  PHOS 2.3 3.8  --  3.1  --   --   --   --   --   --   < > = values in this interval not displayed. Liver Function Tests:  Recent Labs Lab 12/07/14 0415  AST 17   ALT 19  ALKPHOS 78  BILITOT 0.7  PROT 4.5*  ALBUMIN 1.6*    Recent Labs Lab 12/07/14 0415  AMYLASE 76   CBC:  Recent Labs Lab 12/03/14 0400  12/05/14 0356 12/06/14 0400 12/07/14 0415 12/08/14 0356 12/09/14 0452  WBC 2.8*  < > 4.1 4.4 6.6 6.4 6.4  NEUTROABS 2.2  --   --   --   --   --   --   HGB 7.9*  < > 8.2* 8.6* 8.1* 8.0* 7.9*  HCT 23.1*  < >  26.1* 27.0* 26.1* 25.0* 25.3*  MCV 88.2  < > 90.9 90.0 90.9 90.9 92.7  PLT 59*  < > 101* 118* 125* 128* 137*  < > = values in this interval not displayed.  CBG:  Recent Labs Lab 12/08/14 1641 12/08/14 2009 12/08/14 2357 12/09/14 0413 12/09/14 0802  GLUCAP 105* 143* 105* 126* 121*    Recent Results (from the past 240 hour(s))  Culture, blood (routine x 2)     Status: None (Preliminary result)   Collection Time: 12/03/14  9:54 AM  Result Value Ref Range Status   Specimen Description BLOOD RIGHT HAND  Final   Special Requests   Final    BOTTLES DRAWN AEROBIC AND ANAEROBIC 10CC BOTH BOTTLES   Culture  Setup Time   Final    12/03/2014 16:36 Performed at [facility]    Culture   Final           BLOOD CULTURE RECEIVED NO GROWTH TO DATE CULTURE WILL BE HELD FOR 5 DAYS BEFORE ISSUING A FINAL NEGATIVE REPORT Performed at [facility]    Report Status PENDING  Incomplete  Culture, blood (routine x 2)     Status: None (Preliminary result)   Collection Time: 12/03/14 10:11 AM  Result Value Ref Range Status   Specimen Description BLOOD LEFT HAND  Final   Special Requests   Final    BOTTLES DRAWN AEROBIC AND ANAEROBIC 10CC BOTH BOTTLES   Culture  Setup Time   Final    12/03/2014 16:36 Performed at [facility]    Culture   Final           BLOOD CULTURE RECEIVED NO GROWTH TO DATE CULTURE WILL BE HELD FOR 5 DAYS BEFORE ISSUING A FINAL NEGATIVE REPORT Performed at [facility]    Report Status PENDING  Incomplete  Clostridium Difficile by PCR     Status: Abnormal   Collection Time:  12/04/14  6:23 PM  Result Value Ref Range Status   C difficile by pcr POSITIVE (A) NEGATIVE Final    Comment: CRITICAL RESULT CALLED TO, READ BACK BY AND VERIFIED WITH: [email or web address],RN 96/78/93 8101 BY BSLADE Performed at [facility]      Scheduled Meds: . bismuth subsalicylate  30 mL Per Tube BID  . budesonide-formoterol  2 puff Inhalation BID  . chlorhexidine  15 mL Mouth Rinse BID  . insulin aspart  0-15 Units Subcutaneous 6 times per day  . pantoprazole (PROTONIX) IV  40 mg Intravenous Q12H  . potassium chloride  40 mEq Per Tube TID  . sodium chloride  10-40 mL Intracatheter Q12H  . sodium chloride  3 mL Intravenous Q12H  . vancomycin  125 mg Per Tube 4 times per day   Continuous Infusions: . sodium chloride 10 mL/hr (12/06/14 1949)  . sodium chloride 10 mL/hr at 12/05/14 0217  . feeding supplement (GLUCERNA 1.2 CAL) 1,000 mL (12/09/14 0700)  . lactated ringers    . sodium chloride 0.9 % 1,000 mL with potassium chloride 40 mEq infusion 125 mL/hr at 12/09/14 0236

## 2014-12-09 NOTE — Progress Notes (Signed)
General Surgery Note  LOS: 10 days  POD -  9 Days Post-Op  Assessment/Plan: 1. Bleeding duodenal ulcer with cholecystoduodenal fistula  EGD for control of bleeding - 11/30/16 - [name]  Ex Lap, oversewing of posterior bleeding DU, Heineke Mikulicz pyloroplasty, cholecystectomy, repair of right hepatic artery, repair of cholecystoduodenal fistula and placement of duodenostomy tube, feeding jejunostomy - 11/30/14 - Dr. Jackolyn Confer  Taking ice chips.  Looks good.  Plan UGI on Monday, 12/28, to evaluated duodenum  Needs to try to walk in hall  1A.  Duodenostomy tube - 850 cc recorded last 24 hours 1B.  Blake drain - removed  2.  Malnutrition  On TF through J tube 3.  C. Diff  On vancomycin - 12/20>>> 4.  Anemia - Hgb - 7.9 - 12/09/2014 5.  Non small cell lung carcinoma, Stage IIIB (T3, N3, M0) undergoing chemotherapy and radiation therapy. (07/2014)  6.  AODM - SSI  BS - 155- 12/09/2014 7.  DVT prophylaxis - SCD  (chemoprophylaxis on hold secondary to GI bleed) 8.  Foley cath - to remove   Principal Problem:   Bleeding duodenal ulcer s/p ex lap/oversew 11/30/2014 Active Problems:   COPD mixed type   Lung cancer   Diabetes mellitus   Hypokalemia   Anemia   Protein-calorie malnutrition, severe   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   Acute respiratory failure with hypoxia   Absolute anemia   Hyperglycemia  Subjective:  Now on floor.  Doing well, except he wants something to eat.  No family in room. Objective:   Filed Vitals:   12/09/14 0621  BP: 102/68  Pulse: 112  Temp: 99.1 F (37.3 C)  Resp: 20     Intake/Output from previous day:  12/25 0701 - 12/26 0700 In: 5330 [I.V.:3375; NG/GT:1925] Out: 0932 [Urine:2950; Drains:1340; Stool:1650]  Intake/Output this shift:  Total I/O In: -  Out: 125 [Drains:125]   Physical Exam:   General: WN WM who is alert and oriented.    HEENT: Normal. Pupils equal. .   Lungs:  Clear.   Abdomen: Soft.  BS present.  No tenderness   Wound: Incision looks good.  Duodenostomy tube RUQ.  Blake drain removed.  J tube LUQ.     Lab Results:     Recent Labs  12/08/14 0356 12/09/14 0452  WBC 6.4 6.4  HGB 8.0* 7.9*  HCT 25.0* 25.3*  PLT 128* 137*    BMET    Recent Labs  12/08/14 0356 12/09/14 0452  NA 135 138  K 4.4 4.6  CL 112 116*  CO2 17* 18*  GLUCOSE 179* 155*  BUN 13 11  CREATININE 0.60 0.64  CALCIUM 7.3* 7.6*    PT/INR  No results for input(s): LABPROT, INR in the last 72 hours.  ABG  No results for input(s): PHART, HCO3 in the last 72 hours.  Invalid input(s): PCO2, PO2   Studies/Results:  No results found.   Anti-infectives:   Anti-infectives    Start     Dose/Rate Route Frequency Ordered Stop   12/06/14 0800  vancomycin (VANCOCIN) 50 mg/mL oral solution 125 mg     125 mg Per Tube 4 times per day 12/06/14 0706     12/06/14 0715  vancomycin (VANCOCIN) 50 mg/mL oral solution 125 mg  Status:  Discontinued     125 mg Oral 4 times per day 12/06/14 0704 12/06/14 0706   12/05/14 1100  metroNIDAZOLE (FLAGYL) 50 mg/ml oral suspension 500 mg  Status:  Discontinued     500 mg Per Tube 3 times daily 12/05/14 0941 12/06/14 0704   12/03/14 2000  vancomycin (VANCOCIN) IVPB 750 mg/150 ml premix  Status:  Discontinued     750 mg150 mL/hr over 60 Minutes Intravenous Every 8 hours 12/03/14 1000 12/05/14 0929   12/03/14 1800  piperacillin-tazobactam (ZOSYN) IVPB 3.375 g  Status:  Discontinued     3.375 g12.5 mL/hr over 240 Minutes Intravenous Every 8 hours 12/03/14 1000 12/05/14 0929   12/03/14 1100  vancomycin (VANCOCIN) IVPB 750 mg/150 ml premix     750 mg150 mL/hr over 60 Minutes Intravenous  Once 12/03/14 0959 12/03/14 1128   12/03/14 1100  piperacillin-tazobactam (ZOSYN) IVPB 3.375 g     3.375 g100 mL/hr over 30 Minutes Intravenous  Once 12/03/14 0959 12/03/14 1257   11/30/14 2000  ceFAZolin (ANCEF) IVPB 1 g/50 mL premix     1 g100 mL/hr over 30  Minutes Intravenous Every 6 hours 11/30/14 1738 12/01/14 0842   11/30/14 1400  ceFAZolin (ANCEF) IVPB 2 g/50 mL premix  Status:  Discontinued    Comments:  Stat to OR>   2 g100 mL/hr over 30 Minutes Intravenous  Once 11/30/14 1348 11/30/14 1659      Alphonsa Overall, MD, FACS Pager: [facility]: [phone] 12/09/2014

## 2014-12-10 LAB — CBC
HEMATOCRIT: 26.8 % — AB (ref 39.0–52.0)
Hemoglobin: 8.3 g/dL — ABNORMAL LOW (ref 13.0–17.0)
MCH: 28.3 pg (ref 26.0–34.0)
MCHC: 31 g/dL (ref 30.0–36.0)
MCV: 91.5 fL (ref 78.0–100.0)
Platelets: 177 10*3/uL (ref 150–400)
RBC: 2.93 MIL/uL — ABNORMAL LOW (ref 4.22–5.81)
RDW: 18.4 % — AB (ref 11.5–15.5)
WBC: 8.3 10*3/uL (ref 4.0–10.5)

## 2014-12-10 LAB — GLUCOSE, CAPILLARY
GLUCOSE-CAPILLARY: 109 mg/dL — AB (ref 70–99)
Glucose-Capillary: 135 mg/dL — ABNORMAL HIGH (ref 70–99)
Glucose-Capillary: 144 mg/dL — ABNORMAL HIGH (ref 70–99)
Glucose-Capillary: 138 mg/dL — ABNORMAL HIGH (ref 70–99)

## 2014-12-10 LAB — BASIC METABOLIC PANEL
Anion gap: 4 — ABNORMAL LOW (ref 5–15)
BUN: 11 mg/dL (ref 6–23)
CO2: 18 mmol/L — ABNORMAL LOW (ref 19–32)
Calcium: 7.7 mg/dL — ABNORMAL LOW (ref 8.4–10.5)
Chloride: 114 mEq/L — ABNORMAL HIGH (ref 96–112)
Creatinine, Ser: 0.73 mg/dL (ref 0.50–1.35)
GFR calc non Af Amer: >90 mL/min (ref >90)
Glucose, Bld: 154 mg/dL — ABNORMAL HIGH (ref 70–99)
POTASSIUM: 4.7 mmol/L (ref 3.5–5.1)
Sodium: 136 mmol/L (ref 135–145)

## 2014-12-10 NOTE — Clinical Social Work Note (Signed)
   CSW met with pt at bedside to discuss SNF options  CSW prompted discussion about discharge and if pt would be open to a rehab facility if needed.  Pt stated that he has never been to a rehab facility and would like to "see how things go at home"  Pt stated that his wife does not work outside the home and will be there to care for him  Pt stated that his son, alan also resides in his home and could be of assistance if needed.  CSW will continue to monitor until discharge  .Dede Query, LCSW [facility] Clinical Social Worker - Weekend Coverage cell #: [phone]

## 2014-12-10 NOTE — Progress Notes (Signed)
General Surgery Note  LOS: 11 days  POD -  10 Days Post-Op  Assessment/Plan: 1. Bleeding duodenal ulcer with cholecystoduodenal fistula  EGD for control of bleeding - 11/30/16 - [name]  Ex Lap, oversewing of posterior bleeding DU, Heineke Mikulicz pyloroplasty, cholecystectomy, repair of right hepatic artery, repair of cholecystoduodenal fistula and placement of duodenostomy tube, feeding jejunostomy - 11/30/14 - Dr. Jackolyn Confer  Taking ice chips.    Plan CT abdomen (I think this may define his anatomy better than an UGI and will give anatomy outside of the GI track) on Monday, 12/28, to evaluated duodenum  Ambulating better.  1A.  Duodenostomy tube - 200 cc recorded last 24 hours  2.  Malnutrition  On TF through J tube 3.  C. Diff  On PO vancomycin - 12/20>>>  Having loose stools - this is probably related to both C. Diff and tube feeding. 4.  Anemia - Hgb - 8.3 - 12/10/2014 5.  Non small cell lung carcinoma, Stage IIIB (T3, N3, M0) undergoing chemotherapy and radiation therapy. (07/2014)  6.  AODM - SSI  BS - 154- 12/10/2014 7.  DVT prophylaxis - SCD  (chemoprophylaxis on hold secondary to GI bleed) 8.  Foley cath - has tolerated foley out   Principal Problem:   Bleeding duodenal ulcer s/p ex lap/oversew 11/30/2014 Active Problems:   COPD mixed type   Lung cancer   Diabetes mellitus   Hypokalemia   Anemia   Protein-calorie malnutrition, severe   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   Acute respiratory failure with hypoxia   Absolute anemia   Hyperglycemia  Subjective:  He wants something to eat. I discussed our plan for x-rays tomorrow.  No family in room. Objective:   Filed Vitals:   12/10/14 0328  BP: 101/75  Pulse: 114  Temp: 97.7 F (36.5 C)  Resp: 20     Intake/Output from previous day:  12/26 0701 - 12/27 0700 In: 4854 [P.O.:855; I.V.:2753; OE/VO:3500] Out: 9381 [WEXHB:7169; Drains:875;  CVELF:8101]  Intake/Output this shift:  Total I/O In: 120 [Other:120] Out: 700 [Urine:200; Drains:200; Stool:300]   Physical Exam:   General: WN WM who is alert and oriented.    HEENT: Normal. Pupils equal. .   Lungs: Clear.   Abdomen: Soft.  BS present.     Wound: Incision looks good.  Duodenostomy tube RUQ.   J tube LUQ.     Lab Results:     Recent Labs  12/09/14 0452 12/10/14 0328  WBC 6.4 8.3  HGB 7.9* 8.3*  HCT 25.3* 26.8*  PLT 137* 177    BMET    Recent Labs  12/09/14 0452 12/10/14 0328  NA 138 136  K 4.6 4.7  CL 116* 114*  CO2 18* 18*  GLUCOSE 155* 154*  BUN 11 11  CREATININE 0.64 0.73  CALCIUM 7.6* 7.7*    PT/INR  No results for input(s): LABPROT, INR in the last 72 hours.  ABG  No results for input(s): PHART, HCO3 in the last 72 hours.  Invalid input(s): PCO2, PO2   Studies/Results:  No results found.   Anti-infectives:   Anti-infectives    Start     Dose/Rate Route Frequency Ordered Stop   12/06/14 0800  vancomycin (VANCOCIN) 50 mg/mL oral solution 125 mg     125 mg Per Tube 4 times per day 12/06/14 0706     12/06/14 0715  vancomycin (VANCOCIN) 50 mg/mL oral solution 125 mg  Status:  Discontinued  125 mg Oral 4 times per day 12/06/14 0704 12/06/14 0706   12/05/14 1100  metroNIDAZOLE (FLAGYL) 50 mg/ml oral suspension 500 mg  Status:  Discontinued     500 mg Per Tube 3 times daily 12/05/14 0941 12/06/14 0704   12/03/14 2000  vancomycin (VANCOCIN) IVPB 750 mg/150 ml premix  Status:  Discontinued     750 mg150 mL/hr over 60 Minutes Intravenous Every 8 hours 12/03/14 1000 12/05/14 0929   12/03/14 1800  piperacillin-tazobactam (ZOSYN) IVPB 3.375 g  Status:  Discontinued     3.375 g12.5 mL/hr over 240 Minutes Intravenous Every 8 hours 12/03/14 1000 12/05/14 0929   12/03/14 1100  vancomycin (VANCOCIN) IVPB 750 mg/150 ml premix     750 mg150 mL/hr over 60 Minutes Intravenous  Once 12/03/14 0959 12/03/14 1128   12/03/14 1100   piperacillin-tazobactam (ZOSYN) IVPB 3.375 g     3.375 g100 mL/hr over 30 Minutes Intravenous  Once 12/03/14 0959 12/03/14 1257   11/30/14 2000  ceFAZolin (ANCEF) IVPB 1 g/50 mL premix     1 g100 mL/hr over 30 Minutes Intravenous Every 6 hours 11/30/14 1738 12/01/14 0842   11/30/14 1400  ceFAZolin (ANCEF) IVPB 2 g/50 mL premix  Status:  Discontinued    Comments:  Stat to OR>   2 g100 mL/hr over 30 Minutes Intravenous  Once 11/30/14 1348 11/30/14 1659      Alphonsa Overall, MD, FACS Pager: [facility] Office: [phone] 12/10/2014

## 2014-12-10 NOTE — Progress Notes (Signed)
Pt had 15 beats VT on monitor.  Pt asymptomatic.  Notified MD.  No additional orders. Andre Lefort

## 2014-12-10 NOTE — Progress Notes (Signed)
Patient ID: Daryl Vega, male   DOB: 07/09/1946, 68 y.o.   MRN: [id number]  [facility] PROGRESS NOTE  [name] [facility] VVO:[id number] DOB: [birth year] DOA: 11/29/2014 PCP: [name], MD  Brief narrative: 68 y/o male admitted on 12/16 with a GI bleed. Underwent endoscopy on 12/17 and was found to have a bleeding duodenal ulcer which rapidly progressed and he was taken to the OR on 12/17 emergently.There, he had an emergency ex laparotomy, oversewing of a posterior bleeding duodenal ulcer, pyloroplasty, cholecystectomy, repair of R hepatic artery, repair of cholecystoduodenal fistula and placement of a duodenostomy tube and feeding J-tube. PCCM consulted for post op ventilator management.  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 Pneumonia 12/21 dark stool, Hgb stable Dec 19, 2023 no more dark stool, c.diff pcr positive 12/25 - transferred from SDU to tele bed   Assessment and Plan:   Acute respiratory failure with hypoxemia - atelectasis, resolved  Stage IIIB Squamous Cell Lung cancer - dx 07/2014, Rx carboplatin and AUC 08/2014 - VDRF, extubated (since 12/18), respiratory status stable  - no fever and no leukocytosis  - continue pulm hygiene: IS, OOB today  - O2 as needed for O2 saturation > 90% - continue symbicort + xopenex PRN  CVL R IJ Cordis 12/17 >>12/20 PICC 12/21 >> - with HTN and episodes of tachycardia - still tachy with HR in 110 - 120's, no chest pain or shortness of breath  - continue PRN labetalol   Metabolic acidosis - improving  Hypokalemia  - with hypomagnesemia - K and Mg supplemented, WNL this AM  Acute GI Bleeding - d/t  bleeding DU and 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 Diff - EGD for control of bleeding 11/30/16 [name] - POD #8 Emergency Ex Lap, oversewing of posterior bleeding DU, Heineke Mikulicz pyloroplasty, cholecystectomy, repair of right hepatic artery, repair of cholecystoduodenal fistula, placement of duodenostomy tube, feeding jejunostomy. 11/30/14, [name]  - UGI on Monday per surgery   Massive GI bleed from duodenal ulcer > 12-19-23 no active bleeding Acute Blood Loss Anemia  Thrombocytopenia - resolved and now WNL - Transfuse for hgb < 7gm/dL - Hg remains stable for now ~ 8 - continue PPI  C.diff? Not clear he actually has c diff colitis as 20% or more of hospitalized patients are colonized No HCAP - continue oral vancomycin per ID   DM2  - SSI  Pain - Post op abd surgery Baseline Benzo Dependent - temazepam QHS + lorazepam PRN Oversedated 12-19-2023 from dilaudid - PRN analgesia > changed to dilaudid 12/19, decrease dose   DVT prophylaxis - SCD's  Code Status: Full Family Communication: Pt at bedside Disposition Plan: remains inpatient   IV Access:    Peripheral IV Procedures and diagnostic studies:    Dg Chest Port 1 View December 18, 2014 Right upper lobe and left lower lobe infiltrate. Volume loss right upper lobe, stable. No change in cardiac silhouette. Tube and catheter positions unchanged. No pneumothorax.   Dg Chest Port 1 View 12/04/2014 Tube and catheter positions as described without pneumothorax. Volume loss with  patchy infiltrate right upper lobe. Focal area of infiltrate left base as well.  Medical Consultants:    Surgery  Other Consultants:    None  Anti-Infectives/microbiology data:    Oral Vancomycin (C. DIff +)    Blood culture 12/20 >   C.diff pcr 12/22 > positive  [name], MD  [facility] Pager [phone]  If 7PM-7AM, please contact night-coverage [email or web address] Password [email or web address] 12/10/2014, 1:36 PM   LOS: 11 days   HPI/Subjective: No events overnight.   Objective: Filed Vitals:   12/09/14 0621 12/09/14 1400 12/09/14 2031 12/10/14 0328  BP: 102/68 103/65 104/61 101/75  Pulse: 112 112 108 114  Temp: 99.1 F (37.3 C) 98.1 F (36.7 C) 98 F (36.7 C) 97.7 F (36.5 C)  TempSrc: Oral Oral Oral Oral  Resp: 20 20 18 20   Height:      Weight:      SpO2: 98% 96% 95% 98%    Intake/Output Summary (Last 24 hours) at 12/10/14 1336 Last data filed at 12/10/14 2248  Gross per 24 hour  Intake   5083 ml  Output   4225 ml  Net    858 ml    Exam:   General:  Pt is alert, follows commands appropriately, not in acute distress  Cardiovascular: Regular rhythm, tachy, S1/S2, no murmurs, no rubs, no gallops  Respiratory: Clear to auscultation bilaterally, no wheezing, no crackles, no rhonchi  Abdomen: Soft, non tender, non distended, bowel sounds present, no guarding  Data Reviewed: Basic Metabolic Panel:  Recent Labs Lab 12/04/14 0346  12/05/14 0356  12/06/14 0400 12/06/14 1615 12/07/14 0415 12/08/14 0356 12/09/14 0452 12/10/14 0328  NA  --   < > 139  --  138  --  139 135 138 136  K  --   < > 2.8*  < > 2.9* 3.6 3.9 4.4 4.6 4.7  CL  --   < > 104  --  108  --  114* 112 116* 114*  CO2  --   < > 28  --  24  --  21 17* 18* 18*  GLUCOSE  --   < > 164*  --  230*  --  230* 179* 155* 154*  BUN  --   < > 15  --  19  --  16 13 11 11   CREATININE  --   < > 0.72  --  0.73  --  0.62 0.60 0.64 0.73  CALCIUM  --   < > 7.7*  --  7.7*  --  7.4* 7.3* 7.6* 7.7*  MG 1.6  --  1.4*  --  1.4* 1.9 1.6 1.6 1.5  --   PHOS 3.8  --  3.1  --   --   --   --   --   --   --   < > = values in this interval not displayed. Liver Function Tests:  Recent Labs Lab 12/07/14 0415  AST 17  ALT 19  ALKPHOS  78  BILITOT 0.7  PROT 4.5*  ALBUMIN 1.6*    Recent Labs Lab 12/07/14 0415  AMYLASE 76   CBC:  Recent Labs Lab 12/06/14 0400 12/07/14 0415 12/08/14 0356 12/09/14 0452 12/10/14 0328  WBC 4.4 6.6 6.4 6.4 8.3  HGB 8.6* 8.1* 8.0* 7.9* 8.3*  HCT 27.0* 26.1* 25.0* 25.3* 26.8*  MCV 90.0 90.9 90.9 92.7 91.5  PLT 118* 125* 128* 137* 177   CBG:  Recent Labs Lab 12/09/14 2035 12/09/14 2350 12/10/14 0322 12/10/14  9150 12/10/14 1222  GLUCAP 123* 126* 135* 144* 138*    Recent Results (from the past 240 hour(s))  Culture, blood (routine x 2)     Status: None   Collection Time: 12/03/14  9:54 AM  Result Value Ref Range Status   Specimen Description BLOOD RIGHT HAND  Final   Special Requests   Final    BOTTLES DRAWN AEROBIC AND ANAEROBIC 10CC BOTH BOTTLES   Culture  Setup Time   Final   Culture   Final   Report Status 12/09/2014 FINAL  Final  Culture, blood (routine x 2)     Status: None   Collection Time: 12/03/14 10:11 AM  Result Value Ref Range Status   Specimen Description BLOOD LEFT HAND  Final   Special Requests   Final    BOTTLES DRAWN AEROBIC AND ANAEROBIC 10CC BOTH BOTTLES   Culture  Setup Time   Final   Culture   Final    NO GROWTH 5 DAYS Performed at [facility]    Report Status 12/09/2014 FINAL  Final  Clostridium Difficile by PCR     Status: Abnormal   Collection Time: 12/04/14  6:23 PM  Result Value Ref Range Status   C difficile by pcr POSITIVE (A) NEGATIVE Final     Scheduled Meds: . bismuth subsalicylate  30 mL Per Tube BID  . chlorhexidine  15 mL Mouth Rinse BID  . insulin aspart  0-15 Units Subcutaneous 6 times per day  . pantoprazole (PROTONIX) IV  40 mg Intravenous Q12H  . potassium chloride  40 mEq Per Tube TID  . sodium chloride  10-40 mL Intracatheter Q12H  . sodium chloride  3 mL Intravenous Q12H  . vancomycin  125 mg Per Tube 4 times per day   Continuous Infusions: . sodium chloride 10 mL/hr at 12/09/14 2055  . sodium  chloride 10 mL/hr at 12/05/14 0217  . feeding supplement (GLUCERNA 1.2 CAL) 1,000 mL (12/10/14 0352)  . lactated ringers 125 mL/hr (12/10/14 1230)  . sodium chloride 0.9 % 1,000 mL with potassium chloride 40 mEq infusion 125 mL/hr at 12/10/14 5697

## 2014-12-10 NOTE — Progress Notes (Signed)
Physical Therapy Treatment Patient Details Name: Daryl Vega MRN: [id number] DOB: 1946-06-07 Today's Date: 12/10/2014    History of Present Illness 68 y/o male admitted on 12/16 with a GI bleed.  Underwent endoscopy on 12/17 and was found to have a bleeding duodenal ulcer which rapidly progressed and he was taken to the OR on 12/17 emergently. There, he had an emergency ex laparotomy, oversewing of a posterior bleeding duodenal ulcer, pyloroplasty, cholecystectomy, repair of R hepatic artery, repair of cholecystoduodenal fistula and placement of a duodenostomy tube and feeding J-tube.  PCCM consulted for post op ventilator management.    PT Comments    Noted improvement in strength, mobility and endurance with ambulation.  Follow Up Recommendations  SNF     Equipment Recommendations  Rolling walker with 5" wheels    Recommendations for Other Services OT consult     Precautions / Restrictions Precautions Precautions: Fall Precaution Comments: Multiple drains, tubes and catheters Restrictions Weight Bearing Restrictions: No Other Position/Activity Restrictions: WBAT    Mobility  Bed Mobility Overal bed mobility: Needs Assistance Bed Mobility: Supine to Sit     Supine to sit: Min assist     General bed mobility comments: cues for log roll sequencing side ly to sit  Transfers Overall transfer level: Needs assistance Equipment used: Rolling walker (2 wheeled) Transfers: Sit to/from Stand Sit to Stand: Min assist;+2 physical assistance Stand pivot transfers: Min assist       General transfer comment: cues for transition position and use of UEs to self assist.  Stand pvt with RW bed to [facility]  Ambulation/Gait Ambulation/Gait assistance: Min assist;+2 safety/equipment Ambulation Distance (Feet): 20 Feet (20' 6x for total of 120') Assistive device: Rolling walker (2 wheeled) Gait Pattern/deviations: Step-to pattern;Step-through pattern;Decreased step length -  right;Decreased step length - left;Shuffle;Trunk flexed     General Gait Details: cues for posture and position from RW - 6 laps of ~ 20' in room with standing or sitting rests between   Stairs            Wheelchair Mobility    Modified Rankin (Stroke Patients Only)       Balance                                    Cognition Arousal/Alertness: Awake/alert Behavior During Therapy: WFL for tasks assessed/performed Overall Cognitive Status: Within Functional Limits for tasks assessed                      Exercises      General Comments        Pertinent Vitals/Pain Pain Assessment: 0-10 Pain Score: 5  Pain Location: my bowels Pain Descriptors / Indicators: Sore Pain Intervention(s): Limited activity within patient's tolerance;Monitored during session;Premedicated before session    Home Living                      Prior Function            PT Goals (current goals can now be found in the care plan section) Acute Rehab PT Goals Patient Stated Goal: walk PT Goal Formulation: With patient Time For Goal Achievement: 12/19/14 Potential to Achieve Goals: Good Progress towards PT goals: Progressing toward goals    Frequency  Min 3X/week    PT Plan Current plan remains appropriate    Co-evaluation  End of Session   Activity Tolerance: Patient tolerated treatment well;Patient limited by fatigue;Patient limited by pain Patient left: in chair;with call bell/phone within reach     Time: 1007-1057 PT Time Calculation (min) (ACUTE ONLY): 50 min  Charges:  $Gait Training: 23-37 mins $Therapeutic Activity: 8-22 mins                    G Codes:      Xaria Judon Jan 07, 2015, 12:57 PM

## 2014-12-10 NOTE — Clinical Social Work Psychosocial (Signed)
Clinical Social Work Department BRIEF PSYCHOSOCIAL ASSESSMENT 12/10/2014  Patient:  Daryl Vega, Daryl Vega     Account Number:  [id number]     Taylor date:  11/29/2014  Clinical Social Worker:  Dede Query, CLINICAL SOCIAL WORKER  Date/Time:  12/10/2014 02:52 PM  Referred by:  Physician  Date Referred:  12/06/2014 Referred for  SNF Placement   Other Referral:   Interview type:  Patient Other interview type:    PSYCHOSOCIAL DATA Living Status:  WIFE Admitted from facility:   Level of care:   Primary support name:  Yvette Primary support relationship to patient:  SPOUSE Degree of support available:   high    CURRENT CONCERNS  Other Concerns:   Currently pt is hooked up to multiple tubes and catheters. Unsure when or if they will come out.    SOCIAL WORK ASSESSMENT / PLAN CSW met with pt at bedside to discuss possible SNF.  CSW prompted pt to discuss history.  Pt states that he found out he had lung cancer and needed surgery.  Pt stated that he lives at home with his wife and son.  His wife does not work outside the home but his son does work outside the home. Pt also has a daughter living in [place]. Pt has never been to a SNF and is not currently open to CSW sending out his information.  CSW inquired into home much help pt would have at discharge and pt states that his wife and his son will be able to help him.  Pt's wife is available 24/7 and is healthy.   Assessment/plan status:  Psychosocial Support/Ongoing Assessment of Needs Other assessment/ plan:   Information/referral to community resources:    PATIENT'S/FAMILY'S RESPONSE TO PLAN OF CARE: Pt stated that he has never been to a rehab facility before.  Pt stated that he would like to "see how things go at home" and does not want information sent to an facilities.  Pt discussed getting help from his wife and son who he currently resides with.    Dede Query, LCSW [place] Worker -  Weekend Coverage cell #: [phone]

## 2014-12-11 ENCOUNTER — Inpatient Hospital Stay (HOSPITAL_COMMUNITY): Payer: Medicare Other

## 2014-12-11 ENCOUNTER — Encounter (HOSPITAL_COMMUNITY): Payer: Self-pay | Admitting: Radiology

## 2014-12-11 LAB — GLUCOSE, CAPILLARY
GLUCOSE-CAPILLARY: 129 mg/dL — AB (ref 70–99)
GLUCOSE-CAPILLARY: 119 mg/dL — AB (ref 70–99)
GLUCOSE-CAPILLARY: 143 mg/dL — AB (ref 70–99)
GLUCOSE-CAPILLARY: 99 mg/dL (ref 70–99)
Glucose-Capillary: 104 mg/dL — ABNORMAL HIGH (ref 70–99)
Glucose-Capillary: 108 mg/dL — ABNORMAL HIGH (ref 70–99)
Glucose-Capillary: 126 mg/dL — ABNORMAL HIGH (ref 70–99)

## 2014-12-11 LAB — BASIC METABOLIC PANEL
Anion gap: 3 — ABNORMAL LOW (ref 5–15)
BUN: 9 mg/dL (ref 6–23)
CO2: 19 mmol/L (ref 19–32)
CREATININE: 0.59 mg/dL (ref 0.50–1.35)
Calcium: 7.4 mg/dL — ABNORMAL LOW (ref 8.4–10.5)
Chloride: 111 mEq/L (ref 96–112)
Glucose, Bld: 153 mg/dL — ABNORMAL HIGH (ref 70–99)
POTASSIUM: 3.9 mmol/L (ref 3.5–5.1)
Sodium: 133 mmol/L — ABNORMAL LOW (ref 135–145)

## 2014-12-11 LAB — CBC
HEMATOCRIT: 22.6 % — AB (ref 39.0–52.0)
Hemoglobin: 7.2 g/dL — ABNORMAL LOW (ref 13.0–17.0)
MCH: 28.8 pg (ref 26.0–34.0)
MCHC: 31.9 g/dL (ref 30.0–36.0)
MCV: 90.4 fL (ref 78.0–100.0)
Platelets: 147 10*3/uL — ABNORMAL LOW (ref 150–400)
RBC: 2.5 MIL/uL — ABNORMAL LOW (ref 4.22–5.81)
RDW: 18.3 % — ABNORMAL HIGH (ref 11.5–15.5)
WBC: 7.6 10*3/uL (ref 4.0–10.5)

## 2014-12-11 MED ORDER — IOHEXOL 300 MG/ML  SOLN
100.0000 mL | Freq: Once | INTRAMUSCULAR | Status: AC | PRN
  Administered 2014-12-11: 100 mL via INTRAVENOUS

## 2014-12-11 MED ORDER — SODIUM CHLORIDE 0.9 % IV BOLUS (SEPSIS)
500.0000 mL | Freq: Once | INTRAVENOUS | Status: AC
  Administered 2014-12-11: 500 mL via INTRAVENOUS

## 2014-12-11 MED ORDER — IOHEXOL 300 MG/ML  SOLN
25.0000 mL | INTRAMUSCULAR | Status: DC
  Administered 2014-12-11: 25 mL via ORAL

## 2014-12-11 NOTE — Progress Notes (Signed)
Physical Therapy Treatment Patient Details Name: Daryl Vega MRN: [id number] DOB: [birth year] Today's Date: 12/11/2014    History of Present Illness 68 y/o male admitted on 12/16 with a GI bleed.  Underwent endoscopy on 12/17 and was found to have a bleeding duodenal ulcer which rapidly progressed and he was taken to the OR on 12/17 emergently. There, he had an emergency ex laparotomy, oversewing of a posterior bleeding duodenal ulcer, pyloroplasty, cholecystectomy, repair of R hepatic artery, repair of cholecystoduodenal fistula and placement of a duodenostomy tube and feeding J-tube.  PCCM consulted for post op ventilator management.    PT Comments    Pt progressing, wants to go home  Follow Up Recommendations  Home health PT;Supervision/Assistance - 24 hour     Equipment Recommendations  Rolling walker with 5" wheels    Recommendations for Other Services       Precautions / Restrictions Precautions Precautions: Fall    Mobility  Bed Mobility Overal bed mobility: Needs Assistance Bed Mobility: Sidelying to Sit   Sidelying to sit: Min guard       General bed mobility comments: cues for log roll sequencing side ly to sit  Transfers Overall transfer level: Needs assistance Equipment used: Rolling walker (2 wheeled) Transfers: Sit to/from Stand Sit to Stand: Min assist Stand pivot transfers: Min assist       General transfer comment: cues for transition position and use of UEs to self assist.  Stand pvt with RW bed to [facility]  Ambulation/Gait Ambulation/Gait assistance: Min guard Ambulation Distance (Feet): 130 Feet Assistive device: Rolling walker (2 wheeled) Gait Pattern/deviations: Step-through pattern;Decreased stride length Gait velocity: decr   General Gait Details: cues for RW safety, pt tends to step outside RW  at times; 2-3/4 DOE with amb; chair to pt for return to room   Stairs            Wheelchair Mobility    Modified Rankin (Stroke  Patients Only)       Balance                                    Cognition Arousal/Alertness: Awake/alert Behavior During Therapy: WFL for tasks assessed/performed Overall Cognitive Status: Within Functional Limits for tasks assessed                      Exercises      General Comments        Pertinent Vitals/Pain Pain Assessment: 0-10 Pain Score: 4  Pain Location: buttocks Pain Descriptors / Indicators: Discomfort Pain Intervention(s): Limited activity within patient's tolerance;Repositioned;Patient requesting pain meds-RN notified    Home Living                      Prior Function            PT Goals (current goals can now be found in the care plan section) Acute Rehab PT Goals Patient Stated Goal: walk PT Goal Formulation: With patient Time For Goal Achievement: 12/19/14 Potential to Achieve Goals: Good Progress towards PT goals: Progressing toward goals    Frequency  Min 3X/week    PT Plan Discharge plan needs to be updated    Co-evaluation             End of Session   Activity Tolerance: Patient tolerated treatment well;Patient limited by fatigue;Patient limited by pain Patient left: in chair;with call bell/phone  within reach;with family/visitor present     Time: 1457-1520 PT Time Calculation (min) (ACUTE ONLY): 23 min  Charges:  $Gait Training: 23-37 mins                    G Codes:      Tajuanna Burnett 31-Dec-2014, 5:10 PM

## 2014-12-11 NOTE — Progress Notes (Signed)
Patient ID: Daryl Vega, male   DOB: [birth year], 68 y.o.   MRN: [id number]  [facility] PROGRESS NOTE  [name] [facility] YCX:[id number] DOB: [birth year] DOA: 11/29/2014 PCP: [name], MD   Brief narrative: 68 y/o male admitted on 12/16 with a GI bleed. Underwent endoscopy on 12/17 and was found to have a bleeding duodenal ulcer which rapidly progressed and he was taken to the OR on 12/17 emergently.There, he had an emergency ex laparotomy, oversewing of a posterior bleeding duodenal ulcer, pyloroplasty, cholecystectomy, repair of R hepatic artery, repair of cholecystoduodenal fistula and placement of a duodenostomy tube and feeding J-tube. PCCM consulted for post op ventilator management.  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 Pneumonia 12/21 dark stool, Hgb stable 01-05-2024 no more dark stool, c.diff pcr positive 12/25 - transferred from SDU to tele bed   Assessment and Plan:   Acute respiratory failure with hypoxemia - atelectasis, resolved  Stage IIIB Squamous Cell Lung cancer - dx 07/2014, Rx carboplatin and AUC 08/2014 - VDRF, extubated (since 12/18), respiratory status stable  - no fever and no leukocytosis  - continue pulm hygiene: IS, OOB today  - O2 as needed for O2 saturation > 90% - continue symbicort + xopenex PRN  CVL R IJ Cordis 12/17 >>12/20 PICC 12/21 >> - with HTN and episodes of tachycardia - still tachy with HR in 110's, no chest pain or shortness of breath  - continue PRN labetalol   Metabolic acidosis - improving  Hypokalemia  - with hypomagnesemia - K and Mg supplemented, WNL this AM  Acute GI Bleeding - d/t  bleeding DU and 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 Diff - EGD for control of bleeding 11/30/16 [name] - POD #9 Emergency Ex Lap, oversewing of posterior bleeding DU, Heineke Mikulicz pyloroplasty, cholecystectomy, repair of right hepatic artery, repair of cholecystoduodenal fistula, placement of duodenostomy tube, feeding jejunostomy. 11/30/14, [name]  - CT abd done this AM, d/w surgery   Massive GI bleed from duodenal ulcer > 2024-01-05 no active bleeding Acute Blood Loss Anemia  Thrombocytopenia - improving  - Transfuse for hgb < 7gm/dL - Hg down to 7.2 this AM 12/28 - continue PPI  C.diff? Not clear he actually has c diff colitis as 20% or more of hospitalized patients are colonized No HCAP - continue oral vancomycin per ID as signs of colitis still noted on CT abd  DM2  - SSI  Pain - Post op abd surgery Baseline Benzo Dependent - temazepam QHS + lorazepam PRN Oversedated Jan 05, 2024 from dilaudid - PRN analgesia > changed to dilaudid 12/19, decrease dose   DVT prophylaxis - SCD's  Code Status: Full Family Communication: Pt at bedside Disposition Plan: CM consult for possible LTAC   IV Access:    Peripheral IV Procedures and diagnostic studies:    Dg Chest Port 1 View Jan 04, 2015 Right upper lobe and left lower lobe infiltrate. Volume loss right upper lobe, stable. No change in cardiac silhouette. Tube and catheter positions unchanged. No pneumothorax.   Dg Chest Port 1 View 12/04/2014 Tube  and catheter positions as described without pneumothorax. Volume loss with patchy infiltrate right upper lobe. Focal area of infiltrate left base as well. Ct Abdomen Pelvis W Contrast  12/11/2014  Postoperative changes of cholecystectomy with the duodenostomy tube  in position. There is a small amount of extraluminal gas adjacent to the duodenostomy tube, but no large volume of pneumoperitoneum, and no evidence of leakage oral contrast material from the duodenum at this time. In addition, there is a small amount of postoperative fluid tracking inferiorly from the gallbladder fossa immediately posterior and lateral to the second portion of the duodenum. This may simply represent a postoperative seroma, although the possibility of a biloma is not excluded. 2. Diffuse thickening of the wall of the colon and rectum, concerning for a colitis such as C difficile colitis. 3. Gas in the wall of the urinary bladder. The wall of the urinary bladder otherwise does not appear inflamed. This may simply be iatrogenic related to recent Foley catheter placement, however, urinalysis and clinical correlation is recommended to exclude signs or symptoms of emphysematous cystitis. 4. Small bilateral pleural effusions (right greater than left), and small pericardial effusion and/or thickening, new compared to prior PET-CT 07/26/2014. Medical Consultants:    Surgery  Other Consultants:    None  Anti-Infectives/microbiology data:    Oral Vancomycin (C. DIff +)   Blood culture 12/20 >   C.diff pcr 12/22 > positive  [name], MD  [facility] Pager [phone]  If 7PM-7AM, please contact night-coverage [email or web address] Password [facility] 12/11/2014, 11:46 AM   LOS: 12 days   HPI/Subjective: No events overnight.   Objective: Filed Vitals:   12/09/14 2031 12/10/14 0328 12/10/14 2009 12/11/14 0608  BP: 104/61 101/75 91/62 89/64   Pulse: 108 114 111 104  Temp: 98 F (36.7 C) 97.7 F (36.5 C) 98.6 F (37 C) 98.4 F (36.9 C)  TempSrc: Oral Oral Oral Oral  Resp: 18 20 20 20   Height:      Weight:      SpO2: 95% 98% 96% 97%    Intake/Output Summary (Last 24 hours) at 12/11/14 1146 Last data filed at 12/11/14 1113  Gross per 24 hour  Intake 4257.5 ml   Output   2650 ml  Net 1607.5 ml    Exam:   General:  Pt is alert, follows commands appropriately, not in acute distress  Cardiovascular: Regular rhythm, tachy, S1/S2, no rubs, no gallops  Respiratory: Clear to auscultation bilaterally, no wheezing, no crackles, no rhonchi  Abdomen: Soft, non tender, non distended, bowel sounds present, no guarding   Data Reviewed: Basic Metabolic Panel:  Recent Labs Lab 12/05/14 0356  12/06/14 0400 12/06/14 1615 12/07/14 0415 12/08/14 0356 12/09/14 0452 12/10/14 0328 12/11/14 0414  NA 139  --  138  --  139 135 138 136 133*  K 2.8*  < > 2.9* 3.6 3.9 4.4 4.6 4.7 3.9  CL 104  --  108  --  114* 112 116* 114* 111  CO2 28  --  24  --  21 17* 18* 18* 19  GLUCOSE 164*  --  230*  --  230* 179* 155* 154* 153*  BUN 15  --  19  --  16 13 11 11 9   CREATININE 0.72  --  0.73  --  0.62 0.60 0.64 0.73 0.59  CALCIUM 7.7*  --  7.7*  --  7.4* 7.3* 7.6* 7.7* 7.4*  MG 1.4*  --  1.4* 1.9 1.6 1.6 1.5  --   --  PHOS 3.1  --   --   --   --   --   --   --   --   < > = values in this interval not displayed. Liver Function Tests:  Recent Labs Lab 12/07/14 0415  AST 17  ALT 19  ALKPHOS 78  BILITOT 0.7  PROT 4.5*  ALBUMIN 1.6*    Recent Labs Lab 12/07/14 0415  AMYLASE 76   CBC:  Recent Labs Lab 12/07/14 0415 12/08/14 0356 12/09/14 0452 12/10/14 0328 12/11/14 0414  WBC 6.6 6.4 6.4 8.3 7.6  HGB 8.1* 8.0* 7.9* 8.3* 7.2*  HCT 26.1* 25.0* 25.3* 26.8* 22.6*  MCV 90.9 90.9 92.7 91.5 90.4  PLT 125* 128* 137* 177 147*   CBG:  Recent Labs Lab 12/10/14 1650 12/10/14 2006 12/11/14 12/11/14 0605 12/11/14 0819  GLUCAP 109* 108* 104* 129* 119*    Recent Results (from the past 240 hour(s))  Culture, blood (routine x 2)     Status: None   Collection Time: 12/03/14  9:54 AM  Result Value Ref Range Status   Specimen Description BLOOD RIGHT HAND  Final   Special Requests   Final    BOTTLES DRAWN AEROBIC AND ANAEROBIC 10CC BOTH BOTTLES    Culture  Setup Time   Final    12/03/2014 16:36 Performed at [place]   Final    NO GROWTH 5 DAYS Performed at [facility]    Report Status 12/09/2014 FINAL  Final  Culture, blood (routine x 2)     Status: None   Collection Time: 12/03/14 10:11 AM  Result Value Ref Range Status   Specimen Description BLOOD LEFT HAND  Final   Special Requests   Final    BOTTLES DRAWN AEROBIC AND ANAEROBIC 10CC BOTH BOTTLES   Culture  Setup Time   Final    12/03/2014 16:36 Performed at [facility]    Culture   Final    NO GROWTH 5 DAYS Performed at [facility]    Report Status 12/09/2014 FINAL  Final  Clostridium Difficile by PCR     Status: Abnormal   Collection Time: 12/04/14  6:23 PM  Result Value Ref Range Status   C difficile by pcr POSITIVE (A) NEGATIVE Final    Comment: CRITICAL RESULT CALLED TO, READ BACK BY AND VERIFIED WITH: [email or web address] 26/37/85 8850 BY BSLADE Performed at [facility]      Scheduled Meds: . bismuth subsalicylate  30 mL Per Tube BID  . chlorhexidine  15 mL Mouth Rinse BID  . insulin aspart  0-15 Units Subcutaneous 6 times per day  . pantoprazole (PROTONIX) IV  40 mg Intravenous Q12H  . potassium chloride  40 mEq Per Tube TID  . sodium chloride  10-40 mL Intracatheter Q12H  . sodium chloride  3 mL Intravenous Q12H  . vancomycin  125 mg Per Tube 4 times per day   Continuous Infusions: . sodium chloride 10 mL/hr at 12/09/14 2055  . sodium chloride 10 mL/hr at 12/05/14 0217  . feeding supplement (GLUCERNA 1.2 CAL) 1,000 mL (12/10/14 1955)  . lactated ringers 125 mL/hr at 12/11/14 0510  . sodium chloride 0.9 % 1,000 mL with potassium chloride 40 mEq infusion 125 mL/hr at 12/10/14 2774

## 2014-12-11 NOTE — Progress Notes (Signed)
11 Days Post-Op  Subjective: He looks better than last week when I saw him.  Still having allot of diarrhea.  NPO taking tube feedings.    Objective: Vital signs in last 24 hours: Temp:  [98.4 F (36.9 C)-98.6 F (37 C)] 98.4 F (36.9 C) (12/28 7124) Pulse Rate:  [104-111] 104 (12/28 0608) Resp:  [20] 20 (12/28 5809) BP: (89-91)/(62-64) 89/64 mmHg (12/28 0608) SpO2:  [96 %-97 %] 97 % (12/28 9833) Last BM Date: 12/10/14 Npo   Some hypotension at 0300, no recorded BP since.  I have ask them to repeat these. 1225 from duodenostomy, 300 stool recorded H/h is down Na is also down. Intake/Output from previous day: 12/27 0701 - 12/28 0700 In: 4897.5 [P.O.:480; I.V.:2397.5; NG/GT:1900] Out: 2850 [Urine:1325; Drains:1225; Stool:300] Intake/Output this shift: Total I/O In: 30 [Other:30] Out: 500 [Drains:500]  General appearance: alert, cooperative and no distress Resp: clear to auscultation bilaterally and anterior exam GI: soft sore, minimal drainage from duodenostomy tube, tolerating TF, but still having allot of diarrhea.  Staples a little red at the top 1/2 of incision.  Lab Results:   Recent Labs  12/10/14 0328 12/11/14 0414  WBC 8.3 7.6  HGB 8.3* 7.2*  HCT 26.8* 22.6*  PLT 177 147*    BMET  Recent Labs  12/10/14 0328 12/11/14 0414  NA 136 133*  K 4.7 3.9  CL 114* 111  CO2 18* 19  GLUCOSE 154* 153*  BUN 11 9  CREATININE 0.73 0.59  CALCIUM 7.7* 7.4*   PT/INR No results for input(s): LABPROT, INR in the last 72 hours.   Recent Labs Lab 12/07/14 0415  AST 17  ALT 19  ALKPHOS 78  BILITOT 0.7  PROT 4.5*  ALBUMIN 1.6*     Lipase     Component Value Date/Time   LIPASE 41 11/29/2014 0644     Studies/Results: Ct Abdomen Pelvis W Contrast  12/11/2014   CLINICAL DATA:  68 year old male with history of a bleeding duodenal ulcer with cholecystoduodenal fistula status post surgical repair. Additional history of non-small-cell lung cancer undergoing  chemotherapy and radiation therapy (August 2015).  EXAM: CT ABDOMEN AND PELVIS WITH CONTRAST  TECHNIQUE: Multidetector CT imaging of the abdomen and pelvis was performed using the standard protocol following bolus administration of intravenous contrast.  CONTRAST:  25 mL OMNIPAQUE IOHEXOL 300 MG/ML SOLN, 118mL OMNIPAQUE IOHEXOL 300 MG/ML SOLN  COMPARISON:  PET-CT 07/26/2014.  FINDINGS: Lower chest: Small bilateral pleural effusions (right greater than left), layering dependently. Small amount of pericardial fluid and/or thickening, unlikely to be of hemodynamic significance at this time, but new compared to the prior examination.  Hepatobiliary: No cystic or solid hepatic lesions. No intra or extrahepatic biliary ductal dilatation. Status post cholecystectomy. Small linear appearing area of low attenuation in the inferior aspect of segment 3 of the liver (image 22 of series 2) may represent a small contusion. Beneath the cholecystectomy clips there is a 4.2 x 6.3 x 2.3 cm postoperative fluid collection which tracks inferiorly along the posterior and lateral surface of the duodenum, best appreciated on image 30 of series 2, and sagittal image 33.  Pancreas: Unremarkable. Specifically, no pancreatic ductal dilatation.  Spleen: Unremarkable.  Adrenals/Urinary Tract: Several sub cm low-attenuation lesions are noted in the kidneys bilaterally, too small to characterize, but statistically likely a tiny cysts. In addition, there is a 1.4 cm simple cyst in the upper pole of the right kidney. 1.6 cm right adrenal nodule is unchanged, previously characterized as an  adenoma. Left adrenal gland is normal in appearance. No hydroureteronephrosis. Urinary bladder is remarkable for extensive gas in the wall of the urinary bladder.  Stomach/Bowel: Normal appearance of the stomach. Duodenostomy tube in position with tip terminating in the second portion of the duodenum. There is a small amount of extraluminal gas adjacent to the  duodenostomy catheter immediately anterior to the duodenal bulb (image 26 of series 2 and sagittal image 42). Notably, although there is oral contrast material in the stomach and duodenum, there is no leakage of oral contrast material outside the duodenum. No larger volume of pneumoperitoneum is otherwise noted. Percutaneous jejunostomy tube noted. No pathologic dilatation of small bowel or colon. Diffuse thickening of the colonic and rectal walls, concerning for colitis. Inflammatory changes in the overlying mesocolon. 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  Vascular/Lymphatic: Moderate atherosclerosis throughout the abdominal and pelvic vasculature, without evidence of aneurysm or dissection.  Reproductive: Prostate gland and seminal vesicles are unremarkable in appearance.  Other: Small volume of ascites, most evident adjacent to the liver and spleen.  Musculoskeletal: Midline surgical clips in the anterior abdominal wall. There are no aggressive appearing lytic or blastic lesions noted in the visualized portions of the skeleton.  IMPRESSION: 1. Postoperative changes of cholecystectomy with the duodenostomy tube in position. There is a small amount of extraluminal gas adjacent to the duodenostomy tube, but no large volume of pneumoperitoneum, and no evidence of leakage oral contrast material from the duodenum at this time. In addition, there is a small amount of postoperative fluid tracking inferiorly from the gallbladder fossa immediately posterior and lateral to the second portion of the duodenum. This may simply represent a postoperative seroma, although the possibility of a biloma is not excluded. 2. Diffuse thickening of the wall of the colon and rectum, concerning for a colitis such as C difficile colitis. 3. Gas in the wall of the urinary bladder. The wall of the urinary bladder otherwise does not appear inflamed. This may simply be iatrogenic related to  recent Foley catheter placement, however, urinalysis and clinical correlation is recommended to exclude signs or symptoms of emphysematous cystitis. 4. Small bilateral pleural effusions (right greater than left), and small pericardial effusion and/or thickening, new compared to prior PET-CT 07/26/2014. 5. Additional incidental findings, as above. These results were called by telephone at the time of interpretation on 12/11/2014 at 11:10 am to Dr. Alphonsa Overall, who verbally acknowledged these results.   Electronically Signed   By: Vinnie Langton M.D.   On: 12/11/2014 11:13    Medications: . bismuth subsalicylate  30 mL Per Tube BID  . chlorhexidine  15 mL Mouth Rinse BID  . insulin aspart  0-15 Units Subcutaneous 6 times per day  . pantoprazole (PROTONIX) IV  40 mg Intravenous Q12H  . potassium chloride  40 mEq Per Tube TID  . sodium chloride  10-40 mL Intracatheter Q12H  . sodium chloride  3 mL Intravenous Q12H  . vancomycin  125 mg Per Tube 4 times per day    Assessment/Plan 1. 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 Ex Lap, oversewing of posterior bleeding DU, Heineke Mikulicz pyloroplasty, cholecystectomy, repair of right hepatic artery, repair of cholecystoduodenal fistula and placement of duodenostomy tube, feeding jejunostomy - 11/30/14 - Dr. Jackolyn Confer 2. C diff colitis -  He has had 5 days of vancomycin per Feeding tube 3.  Malnutrition on tube feeding thru  J tube 4.  Non small cell lung carcinoma, Stage IIIB (T3, N3, M0) undergoing chemotherapy and radiation therapy. (07/2014) 5.  Anemia 6.  AODM 7.  Hypolakemia,  Resolved.  Plan:  He looks good, activity somewhat limited by tubing and diarrhea  Ct shows no leak of contrast, post op fluid tracking from GB fossa to the duodenum, some air around the duodenostomy tube. Diffuse thickening of the wall of the colon and rectum - C  diff colitis positive.  Gas in urinary bladder, with repeat ua/urine culture ordered. Bilateraal Pleural effusions, R>L.  I will start on clamping or at least camping trials of  the duodenostomy tube.  Continue jejunostomy tube feeds.  BP up to 103/59.  I will give him a NS bolus also.  See how he does.  I don't think he is ready for transfer to another facility or LTAC, where they would not have anyone to handle his surgery issues.        LOS: 12 days    Ensley Blas 12/11/2014

## 2014-12-12 LAB — BASIC METABOLIC PANEL
Anion gap: 6 (ref 5–15)
BUN: 8 mg/dL (ref 6–23)
CHLORIDE: 107 meq/L (ref 96–112)
CO2: 21 mmol/L (ref 19–32)
Calcium: 7.4 mg/dL — ABNORMAL LOW (ref 8.4–10.5)
Creatinine, Ser: 0.7 mg/dL (ref 0.50–1.35)
GFR calc Af Amer: >90 mL/min (ref >90)
GFR calc non Af Amer: >90 mL/min (ref >90)
GLUCOSE: 155 mg/dL — AB (ref 70–99)
Potassium: 3.6 mmol/L (ref 3.5–5.1)
SODIUM: 134 mmol/L — AB (ref 135–145)

## 2014-12-12 LAB — URINALYSIS, ROUTINE W REFLEX MICROSCOPIC
Bilirubin Urine: NEGATIVE
Bilirubin Urine: NEGATIVE
GLUCOSE, UA: NEGATIVE mg/dL
Glucose, UA: NEGATIVE mg/dL
Hgb urine dipstick: NEGATIVE
KETONES UR: NEGATIVE mg/dL
Ketones, ur: NEGATIVE mg/dL
NITRITE: NEGATIVE
Nitrite: NEGATIVE
PH: 5.5 (ref 5.0–8.0)
Protein, ur: NEGATIVE mg/dL
Protein, ur: NEGATIVE mg/dL
SPECIFIC GRAVITY, URINE: 1.01 (ref 1.005–1.030)
Specific Gravity, Urine: 1.011 (ref 1.005–1.030)
Urobilinogen, UA: 0.2 mg/dL (ref 0.0–1.0)
Urobilinogen, UA: 0.2 mg/dL (ref 0.0–1.0)
pH: 5 (ref 5.0–8.0)

## 2014-12-12 LAB — GLUCOSE, CAPILLARY
GLUCOSE-CAPILLARY: 138 mg/dL — AB (ref 70–99)
GLUCOSE-CAPILLARY: 120 mg/dL — AB (ref 70–99)
Glucose-Capillary: 110 mg/dL — ABNORMAL HIGH (ref 70–99)
Glucose-Capillary: 121 mg/dL — ABNORMAL HIGH (ref 70–99)
Glucose-Capillary: 137 mg/dL — ABNORMAL HIGH (ref 70–99)

## 2014-12-12 LAB — CBC
HCT: 22.3 % — ABNORMAL LOW (ref 39.0–52.0)
HEMOGLOBIN: 7.2 g/dL — AB (ref 13.0–17.0)
MCH: 29 pg (ref 26.0–34.0)
MCHC: 32.3 g/dL (ref 30.0–36.0)
MCV: 89.9 fL (ref 78.0–100.0)
Platelets: 153 10*3/uL (ref 150–400)
RBC: 2.48 MIL/uL — AB (ref 4.22–5.81)
RDW: 18.1 % — ABNORMAL HIGH (ref 11.5–15.5)
WBC: 7.8 10*3/uL (ref 4.0–10.5)

## 2014-12-12 LAB — PREALBUMIN: PREALBUMIN: 14.3 mg/dL — AB (ref 17.0–34.0)

## 2014-12-12 LAB — URINE MICROSCOPIC-ADD ON

## 2014-12-12 MED ORDER — CEFTRIAXONE SODIUM IN DEXTROSE 20 MG/ML IV SOLN
1.0000 g | INTRAVENOUS | Status: DC
  Administered 2014-12-12: 1 g via INTRAVENOUS
  Filled 2014-12-12: qty 50

## 2014-12-12 MED ORDER — TAB-A-VITE/IRON PO TABS
1.0000 | ORAL_TABLET | Freq: Every day | ORAL | Status: DC
  Administered 2014-12-12 – 2014-12-20 (×8): 1 via ORAL
  Filled 2014-12-12 (×10): qty 1

## 2014-12-12 MED ORDER — ACETAMINOPHEN 325 MG PO TABS: 650.0000 mg | ORAL_TABLET | ORAL | Status: DC | PRN

## 2014-12-12 MED ORDER — OXYCODONE-ACETAMINOPHEN 5-325 MG PO TABS
1.0000 | ORAL_TABLET | ORAL | Status: DC | PRN
  Administered 2014-12-13 – 2014-12-19 (×16): 2 via ORAL
  Filled 2014-12-12 (×16): qty 2

## 2014-12-12 NOTE — Progress Notes (Signed)
Patient ID: Daryl Vega, male   DOB: 12-02-1946, 68 y.o.   MRN: [id number]  [facility] PROGRESS NOTE  [name] [facility] FAO:[id number] DOB: 07-Jul-1946 DOA: 11/29/2014 PCP: [name], MD   Brief narrative: 68 y/o male admitted on 12/16 with a GI bleed. Underwent endoscopy on 12/17 and was found to have a bleeding duodenal ulcer which rapidly progressed and he was taken to the OR on 12/17 emergently.There, he had an emergency ex laparotomy, oversewing of a posterior bleeding duodenal ulcer, pyloroplasty, cholecystectomy, repair of R hepatic artery, repair of cholecystoduodenal fistula and placement of a duodenostomy tube and feeding J-tube. PCCM consulted for post op ventilator management.  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 Pneumonia 12/21 dark stool, Hgb stable 12/22 no more dark stool, c.diff pcr positive 12/25 - transferred from SDU to tele bed, care transferred to [facility] from [facility]  12/29 - ? UTI based on UA, rocephin started empirically and urine culture requested   Assessment and Plan:   Acute respiratory failure with hypoxemia - atelectasis, resolved  Stage IIIB Squamous Cell Lung cancer - dx 07/2014, Rx carboplatin and AUC 08/2014 - VDRF, extubated (since 12/18), respiratory status stable  - continue pulm hygiene: IS, OOB today  - O2 as needed for O2 saturation > 90% - continue symbicort + xopenex PRN  HTN with persistent tachycardia  - still tachy with HR in 110's, no chest pain or shortness of breath  - continue PRN labetalol   Metabolic acidosis - resolved  - with hypokalemia and hypomagnesemia - K and Mg supplemented, WNL  this AM  Acute GI Bleeding - bleeding duodenal ulcer, 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 - POD #12 Emergency Ex Lap, oversewing of posterior bleeding DU, Heineke Mikulicz pyloroplasty, cholecystectomy, repair of right hepatic artery, repair of cholecystoduodenal fistula, placement of duodenostomy tube, feeding jejunostomy. 11/30/14, [name]  - management per surgery team  - no bleeding since 12/05/2014 - allowing sips of clears per surgery 12/29 - continue duodenostomy tube clamping - clean and redress duodenostomy site more frequently - continue to mobilize.  Thrombocytopenia - resolved  - Transfuse for hgb < 7gm/dL - Hg down to 7.2 this AM 12/29 - continue PPI  Protein calorie malnutrition, severe (in the context of acute illness)  - allowing sips of water per surgery team   C.diff? Not clear he actually has c diff colitis as 20% or more of hospitalized patients are colonized - continue oral vancomycin per ID as signs of colitis still noted on CT abd - oral vanc started 12/23, today is day #7/14  ? UTI - based on UA - started on Rocephin per surgery 12/29, agree with plan - follow up on urine culture   DM2  - SSI  Pain - Post op abd surgery - Oversedated 12/22 from dilaudid - PRN analgesia > changed to morphine and percocet as needed only, no scheduled analgesics   DVT prophylaxis - SCD's  Code Status: Full Family Communication: Pt at bedside Disposition Plan: Not ready for d/c per surgery team   IV Access:    CVL R IJ Cordis 12/17 >>12/20  PICC 12/21 >> Procedures and diagnostic studies:  Dg Chest Port 1 View 12/05/2014 Right upper lobe and left lower lobe infiltrate. Volume loss right upper lobe, stable. No change in cardiac silhouette. Tube and catheter positions unchanged. No pneumothorax.   Dg Chest Port 1 View 12/04/2014 Tube and catheter positions as described  without pneumothorax. Volume loss with patchy infiltrate right upper lobe. Focal area of infiltrate left base as well.  Ct Abdomen Pelvis W Contrast 12/11/2014 Postoperative changes of cholecystectomy with the duodenostomy tube in position. There is a small amount of extraluminal gas adjacent to the duodenostomy tube, but no large volume of pneumoperitoneum, and no evidence of leakage oral contrast material from the duodenum at this time. In addition, there is a small amount of postoperative fluid tracking inferiorly from the gallbladder fossa immediately posterior and lateral to the second portion of the duodenum. This may simply represent a postoperative seroma, although the possibility of a biloma is not excluded. 2. Diffuse thickening of the wall of the colon and rectum, concerning for a colitis such as C difficile colitis. 3. Gas in the wall of the urinary bladder. The wall of the urinary bladder otherwise does not appear inflamed. This may simply be iatrogenic related to recent Foley catheter placement, however, urinalysis and clinical correlation is recommended to exclude signs or symptoms of emphysematous cystitis. 4. Small bilateral pleural effusions (right greater than left), and small pericardial effusion and/or thickening, new compared to prior PET-CT 07/26/2014. Medical Consultants:    Surgery  Other Consultants:    None  Anti-Infectives/microbiology data:    Oral Vancomycin (C. DIff +) 12/23 -->  Blood culture 12/20 >   Rocephin 12/29 --> (for presumptive UTI)  [name], MD  [facility] Pager [phone]  If 7PM-7AM, please contact night-coverage [email or web address] Password [email or web address] 12/12/2014, 11:18 AM   LOS: 13 days   HPI/Subjective: No events overnight.   Objective: Filed Vitals:   12/11/14 0608 12/11/14 1317 12/11/14 2002 12/12/14 0401  BP: 89/64 103/59 95/56 106/69  Pulse: 104 123 114 119  Temp: 98.4 F (36.9 C)  98.6 F (37 C) 98.2 F (36.8  C)  TempSrc: Oral  Oral Oral  Resp: 20  20 22   Height:      Weight:      SpO2: 97%  94% 94%    Intake/Output Summary (Last 24 hours) at 12/12/14 1118 Last data filed at 12/12/14 0900  Gross per 24 hour  Intake   4680 ml  Output   1550 ml  Net   3130 ml    Exam:   General:  Pt is alert, follows commands appropriately, not in acute distress  Cardiovascular: Regular rate and rhythm, S1/S2, no murmurs, no rubs, no gallops  Respiratory: Clear to auscultation bilaterally, no wheezing, no crackles, no rhonchi  Abdomen: Soft, non tender, non distended, no guarding   Data Reviewed: Basic Metabolic Panel:  Recent Labs Lab 12/06/14 0400 12/06/14 1615 12/07/14 0415 12/08/14 0356 12/09/14 0452 12/10/14 0328 12/11/14 0414 12/12/14 0340  NA 138  --  139 135 138 136 133* 134*  K 2.9* 3.6 3.9 4.4 4.6 4.7 3.9 3.6  CL 108  --  114* 112 116* 114* 111 107  CO2 24  --  21 17* 18* 18* 19 21  GLUCOSE 230*  --  230* 179* 155* 154* 153* 155*  BUN 19  --  16 13 11 11 9 8   CREATININE 0.73  --  0.62 0.60 0.64 0.73 0.59 0.70  CALCIUM 7.7*  --  7.4* 7.3* 7.6* 7.7* 7.4*  7.4*  MG 1.4* 1.9 1.6 1.6 1.5  --   --   --    Liver Function Tests:  Recent Labs Lab 12/07/14 0415  AST 17  ALT 19  ALKPHOS 78  BILITOT 0.7  PROT 4.5*  ALBUMIN 1.6*    Recent Labs Lab 12/07/14 0415  AMYLASE 76   CBC:  Recent Labs Lab 12/08/14 0356 12/09/14 0452 12/10/14 0328 12/11/14 0414 12/12/14 0340  WBC 6.4 6.4 8.3 7.6 7.8  HGB 8.0* 7.9* 8.3* 7.2* 7.2*  HCT 25.0* 25.3* 26.8* 22.6* 22.3*  MCV 90.9 92.7 91.5 90.4 89.9  PLT 128* 137* 177 147* 153   CBG:  Recent Labs Lab 12/11/14 1621 12/11/14 2008 12/12/14 0012 12/12/14 0354 12/12/14 0726  GLUCAP 99 126* 110* 138* 121*    Recent Results (from the past 240 hour(s))  Culture, blood (routine x 2)     Status: None   Collection Time: 12/03/14  9:54 AM  Result Value Ref Range Status   Specimen Description BLOOD RIGHT HAND  Final    Special Requests   Final    BOTTLES DRAWN AEROBIC AND ANAEROBIC 10CC BOTH BOTTLES   Culture  Setup Time   Final    12/03/2014 16:36 Performed at [facility]    Culture   Final    NO GROWTH 5 DAYS Performed at [facility]    Report Status 12/09/2014 FINAL  Final  Culture, blood (routine x 2)     Status: None   Collection Time: 12/03/14 10:11 AM  Result Value Ref Range Status   Specimen Description BLOOD LEFT HAND  Final   Special Requests   Final    BOTTLES DRAWN AEROBIC AND ANAEROBIC 10CC BOTH BOTTLES   Culture  Setup Time   Final    12/03/2014 16:36 Performed at [facility]    Culture   Final    NO GROWTH 5 DAYS Performed at [facility]    Report Status 12/09/2014 FINAL  Final  Clostridium Difficile by PCR     Status: Abnormal   Collection Time: 12/04/14  6:23 PM  Result Value Ref Range Status   C difficile by pcr POSITIVE (A) NEGATIVE Final    Comment: CRITICAL RESULT CALLED TO, READ BACK BY AND VERIFIED WITH: [email or web address],RN 27/74/12 8786 BY BSLADE Performed at [facility]      Scheduled Meds: . bismuth subsalicylate  30 mL Per Tube BID  . cefTRIAXone (ROCEPHIN)  IV  1 g Intravenous Q24H  . chlorhexidine  15 mL Mouth Rinse BID  . insulin aspart  0-15 Units Subcutaneous 6 times per day  . multivitamins with iron  1 tablet Oral QPC supper  . pantoprazole (PROTONIX) IV  40 mg Intravenous Q12H  . potassium chloride  40 mEq Per Tube TID  . sodium chloride  10-40 mL Intracatheter Q12H  . sodium chloride  3 mL Intravenous Q12H  . vancomycin  125 mg Per Tube 4 times per day   Continuous Infusions: . sodium chloride 10 mL/hr at 12/09/14 2055  . sodium chloride 10 mL/hr at 12/05/14 0217  . feeding supplement (GLUCERNA 1.2 CAL) 1,000 mL (12/12/14 0115)  . sodium chloride 0.9 % 1,000 mL with potassium chloride 40 mEq infusion 125 mL/hr at 12/10/14 7672

## 2014-12-12 NOTE — Progress Notes (Signed)
12 Days Post-Op  Subjective: He is tired and worn down.  Had some nausea this AM after pepto, but did OK last PM with tube clamped.  Upper 1/3rd of the incision is red with some cellulitis, and the duodenostomy tube site is also red and doesn't looks very good.  I will have them clean both and redress more frequently.  I disconnected his duodenostomy bag all together and will see how he does.  He said he walked some yesterday and it wore him out.  Complains of burning with urination.  Objective:   Vital signs in last 24 hours: Temp:  [98.2 F (36.8 C)-98.6 F (37 C)] 98.2 F (36.8 C) (12/29 0401) Pulse Rate:  [114-123] 119 (12/29 0401) Resp:  [20-22] 22 (12/29 0401) BP: (95-106)/(56-69) 106/69 mmHg (12/29 0401) SpO2:  [94 %] 94 % (12/29 0401) Last BM Date: 12/11/14 1300 from duedonostomy tube yesterday, clamped in PM 10 stools yesterday, one this Am so far recorded Still running low BP, with tachycardia Labs OK, H/H is stable at 7.7/22.3 Urine shows probable UTI, culture is ordered and pending Intake/Output from previous day: 12/28 0701 - 12/29 0700 In: 4710 [I.V.:3000; NG/GT:1680] Out: 1950 [Urine:650; Drains:1300] Intake/Output this shift: Total I/O In: -  Out: 100 [Urine:100]  General appearance: alert, cooperative, no distress and tired and worn out appearing. Resp: few rales at the bases. GI: soft sore, still complaining of pain.  few BS, lots of loose stools.  Wound upper 1/3 is worrisome as is the duodenostomy tube site.  I will put staple removal set in room for possible staple removal in AM.    Lab Results:   Recent Labs  12/11/14 0414 12/12/14 0340  WBC 7.6 7.8  HGB 7.2* 7.2*  HCT 22.6* 22.3*  PLT 147* 153    BMET  Recent Labs  12/11/14 0414 12/12/14 0340  NA 133* 134*  K 3.9 3.6  CL 111 107  CO2 19 21  GLUCOSE 153* 155*  BUN 9 8  CREATININE 0.59 0.70  CALCIUM 7.4* 7.4*   PT/INR No results for input(s): LABPROT, INR in the last 72  hours.   Recent Labs Lab 12/07/14 0415  AST 17  ALT 19  ALKPHOS 78  BILITOT 0.7  PROT 4.5*  ALBUMIN 1.6*     Lipase     Component Value Date/Time   LIPASE 41 11/29/2014 0644     Studies/Results: Ct Abdomen Pelvis W Contrast  12/11/2014   CLINICAL DATA:  68 year old male with history of a bleeding duodenal ulcer with cholecystoduodenal fistula status post surgical repair. Additional history of non-small-cell lung cancer undergoing chemotherapy and radiation therapy (August 2015).  EXAM: CT ABDOMEN AND PELVIS WITH CONTRAST  TECHNIQUE: Multidetector CT imaging of the abdomen and pelvis was performed using the standard protocol following bolus administration of intravenous contrast.  CONTRAST:  25 mL OMNIPAQUE IOHEXOL 300 MG/ML SOLN, 134mL OMNIPAQUE IOHEXOL 300 MG/ML SOLN  COMPARISON:  PET-CT 07/26/2014.  FINDINGS: Lower chest: Small bilateral pleural effusions (right greater than left), layering dependently. Small amount of pericardial fluid and/or thickening, unlikely to be of hemodynamic significance at this time, but new compared to the prior examination.  Hepatobiliary: No cystic or solid hepatic lesions. No intra or extrahepatic biliary ductal dilatation. Status post cholecystectomy. Small linear appearing area of low attenuation in the inferior aspect of segment 3 of the liver (image 22 of series 2) may represent a small contusion. Beneath the cholecystectomy clips there is a 4.2 x 6.3 x 2.3  cm postoperative fluid collection which tracks inferiorly along the posterior and lateral surface of the duodenum, best appreciated on image 30 of series 2, and sagittal image 33.  Pancreas: Unremarkable. Specifically, no pancreatic ductal dilatation.  Spleen: Unremarkable.  Adrenals/Urinary Tract: Several sub cm low-attenuation lesions are noted in the kidneys bilaterally, too small to characterize, but statistically likely a tiny cysts. In addition, there is a 1.4 cm simple cyst in the upper pole of  the right kidney. 1.6 cm right adrenal nodule is unchanged, previously characterized as an adenoma. Left adrenal gland is normal in appearance. No hydroureteronephrosis. Urinary bladder is remarkable for extensive gas in the wall of the urinary bladder.  Stomach/Bowel: Normal appearance of the stomach. Duodenostomy tube in position with tip terminating in the second portion of the duodenum. There is a small amount of extraluminal gas adjacent to the duodenostomy catheter immediately anterior to the duodenal bulb (image 26 of series 2 and sagittal image 42). Notably, although there is oral contrast material in the stomach and duodenum, there is no leakage of oral contrast material outside the duodenum. No larger volume of pneumoperitoneum is otherwise noted. Percutaneous jejunostomy tube noted. No pathologic dilatation of small bowel or colon. Diffuse thickening of the colonic and rectal walls, concerning for colitis. Inflammatory changes in the overlying mesocolon. 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  Vascular/Lymphatic: Moderate atherosclerosis throughout the abdominal and pelvic vasculature, without evidence of aneurysm or dissection.  Reproductive: Prostate gland and seminal vesicles are unremarkable in appearance.  Other: Small volume of ascites, most evident adjacent to the liver and spleen.  Musculoskeletal: Midline surgical clips in the anterior abdominal wall. There are no aggressive appearing lytic or blastic lesions noted in the visualized portions of the skeleton.  IMPRESSION: 1. Postoperative changes of cholecystectomy with the duodenostomy tube in position. There is a small amount of extraluminal gas adjacent to the duodenostomy tube, but no large volume of pneumoperitoneum, and no evidence of leakage oral contrast material from the duodenum at this time. In addition, there is a small amount of postoperative fluid tracking inferiorly from the  gallbladder fossa immediately posterior and lateral to the second portion of the duodenum. This may simply represent a postoperative seroma, although the possibility of a biloma is not excluded. 2. Diffuse thickening of the wall of the colon and rectum, concerning for a colitis such as C difficile colitis. 3. Gas in the wall of the urinary bladder. The wall of the urinary bladder otherwise does not appear inflamed. This may simply be iatrogenic related to recent Foley catheter placement, however, urinalysis and clinical correlation is recommended to exclude signs or symptoms of emphysematous cystitis. 4. Small bilateral pleural effusions (right greater than left), and small pericardial effusion and/or thickening, new compared to prior PET-CT 07/26/2014. 5. Additional incidental findings, as above. These results were called by telephone at the time of interpretation on 12/11/2014 at 11:10 am to Dr. Alphonsa Overall, who verbally acknowledged these results.   Electronically Signed   By: Vinnie Langton M.D.   On: 12/11/2014 11:13    Medications: . bismuth subsalicylate  30 mL Per Tube BID  . chlorhexidine  15 mL Mouth Rinse BID  . insulin aspart  0-15 Units Subcutaneous 6 times per day  . pantoprazole (PROTONIX) IV  40 mg Intravenous Q12H  . potassium chloride  40 mEq Per Tube TID  . sodium chloride  10-40 mL Intracatheter Q12H  . sodium chloride  3 mL Intravenous  Q12H  . vancomycin  125 mg Per Tube 4 times per day   . sodium chloride 10 mL/hr at 12/09/14 2055  . sodium chloride 10 mL/hr at 12/05/14 0217  . feeding supplement (GLUCERNA 1.2 CAL) 1,000 mL (12/12/14 0115)  . lactated ringers 125 mL/hr at 12/12/14 0624  . sodium chloride 0.9 % 1,000 mL with potassium chloride 40 mEq infusion 125 mL/hr at 12/10/14 5732   Prior to Admission medications   Medication Sig Start Date End Date Taking? Authorizing Provider  atorvastatin (LIPITOR) 80 MG tablet Take 80 mg by mouth daily.   Yes Historical Provider,  MD  budesonide-formoterol (SYMBICORT) 160-4.5 MCG/ACT inhaler Take 2 puffs first thing in am and then another 2 puffs about 12 hours later. Patient taking differently: Inhale 2 puffs into the lungs 2 (two) times daily as needed.  10/17/14  Yes Tanda Rockers, MD  Cholecalciferol (VITAMIN D3 PO) Take 10,000 Units by mouth daily.   Yes Historical Provider, MD  Cinnamon 500 MG capsule Take 500 mg by mouth daily.   Yes Historical Provider, MD  Dapagliflozin Propanediol (FARXIGA) 5 MG TABS Take 5 mg by mouth 2 (two) times daily.   Yes Historical Provider, MD  glimepiride (AMARYL) 4 MG tablet Take 4 mg by mouth 2 (two) times daily. Take before meals   Yes Historical Provider, MD  HYDROcodone-acetaminophen (NORCO) 5-325 MG per tablet Take 1 tablet by mouth every 6 (six) hours as needed for moderate pain. 11/21/14  Yes Carlton Adam, PA-C  LORazepam (ATIVAN) 0.5 MG tablet Take 1 table by mouth every 8 to 12 hours as needed for anxiety 11/08/14  Yes Adrena E Johnson, PA-C  metFORMIN (GLUCOPHAGE) 500 MG tablet Take 500 mg by mouth 2 (two) times daily with a meal.   Yes Historical Provider, MD  methylPREDNIsolone (MEDROL DOSPACK) 4 MG tablet follow package directions 11/21/14  Yes Adrena E Johnson, PA-C  omeprazole (PRILOSEC) 20 MG capsule Take 20 mg by mouth daily.   Yes Historical Provider, MD  potassium chloride SA (K-DUR,KLOR-CON) 20 MEQ tablet Take 1 tablet (20 mEq total) by mouth daily. 10/28/14  Yes Garald Balding, NP  PRESCRIPTION MEDICATION once a week.   Yes Historical Provider, MD  prochlorperazine (COMPAZINE) 10 MG tablet Take 1 tablet (10 mg total) by mouth every 6 (six) hours as needed for nausea or vomiting. 10/25/14  Yes Drue Second, NP  temazepam (RESTORIL) 15 MG capsule Take 1 capsule (15 mg total) by mouth at bedtime as needed for sleep. 11/01/14  Yes Curt Bears, MD  potassium chloride (K-DUR) 10 MEQ tablet Take 2 tablets (20 mEq total) by mouth 2 (two) times daily. 10/23/14 10/26/14   Barton Dubois, MD  potassium chloride SA (K-DUR,KLOR-CON) 20 MEQ tablet Take 1 tablet (20 mEq total) by mouth daily. Patient not taking: Reported on 11/21/2014 10/28/14   Garald Balding, NP      Diagnosis Stomach, biopsy - BENIGN GASTRIC MUCOSA WITH SLIGHT CHRONIC INFLAMMATION. - WARTHIN-STARRY STAIN NEGATIVE FOR HELICOBACTER PYLORI. - NO DYSPLASIA OR MALIGNANCY. Assessment/Plan 1. 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 Ex Lap, oversewing of posterior bleeding DU, Heineke Mikulicz pyloroplasty, cholecystectomy, repair of right hepatic artery, repair of cholecystoduodenal fistula and placement of duodenostomy tube, feeding jejunostomy - 11/30/14 - Dr. Jackolyn Confer 2. C diff colitis - He has had 5 days of vancomycin per Feeding tube 3. Malnutrition on tube feeding thru J tube 4. Non small  cell lung carcinoma, Stage IIIB (T3, N3, M0) undergoing chemotherapy and radiation therapy. (07/2014) 5. Anemia 6. AODM 7. Hypolakemia, Resolved.  8.  Probable UTI   Plan:  Sips of clears and continue duodenostomy tube clamping.  Add rocephin for possible UTI, and help with skin bacteria also.  Clean and redress duodenostomy site more frequently.  Continue to mobilize.    LOS: 13 days    Katara Griner 12/12/2014

## 2014-12-13 ENCOUNTER — Ambulatory Visit: Payer: BC Managed Care – PPO

## 2014-12-13 ENCOUNTER — Ambulatory Visit: Payer: Medicare Other | Admitting: Internal Medicine

## 2014-12-13 ENCOUNTER — Other Ambulatory Visit: Payer: BC Managed Care – PPO

## 2014-12-13 ENCOUNTER — Other Ambulatory Visit: Payer: Medicare Other

## 2014-12-13 DIAGNOSIS — D62 Acute posthemorrhagic anemia: Secondary | ICD-10-CM

## 2014-12-13 DIAGNOSIS — E43 Unspecified severe protein-calorie malnutrition: Secondary | ICD-10-CM

## 2014-12-13 LAB — GLUCOSE, CAPILLARY
GLUCOSE-CAPILLARY: 181 mg/dL — AB (ref 70–99)
GLUCOSE-CAPILLARY: 123 mg/dL — AB (ref 70–99)
Glucose-Capillary: 112 mg/dL — ABNORMAL HIGH (ref 70–99)
Glucose-Capillary: 114 mg/dL — ABNORMAL HIGH (ref 70–99)
Glucose-Capillary: 125 mg/dL — ABNORMAL HIGH (ref 70–99)
Glucose-Capillary: 143 mg/dL — ABNORMAL HIGH (ref 70–99)
Glucose-Capillary: 151 mg/dL — ABNORMAL HIGH (ref 70–99)
Glucose-Capillary: 177 mg/dL — ABNORMAL HIGH (ref 70–99)

## 2014-12-13 LAB — BASIC METABOLIC PANEL
Anion gap: 10 (ref 5–15)
BUN: 7 mg/dL (ref 6–23)
CO2: 23 mmol/L (ref 19–32)
Calcium: 7.3 mg/dL — ABNORMAL LOW (ref 8.4–10.5)
Chloride: 101 mEq/L (ref 96–112)
Creatinine, Ser: 0.68 mg/dL (ref 0.50–1.35)
GFR calc Af Amer: >90 mL/min (ref >90)
GFR calc non Af Amer: >90 mL/min (ref >90)
GLUCOSE: 169 mg/dL — AB (ref 70–99)
POTASSIUM: 3.6 mmol/L (ref 3.5–5.1)
Sodium: 134 mmol/L — ABNORMAL LOW (ref 135–145)

## 2014-12-13 LAB — CBC
HCT: 22.3 % — ABNORMAL LOW (ref 39.0–52.0)
HEMOGLOBIN: 7 g/dL — AB (ref 13.0–17.0)
MCH: 28.6 pg (ref 26.0–34.0)
MCHC: 31.4 g/dL (ref 30.0–36.0)
MCV: 91 fL (ref 78.0–100.0)
Platelets: 181 10*3/uL (ref 150–400)
RBC: 2.45 MIL/uL — AB (ref 4.22–5.81)
RDW: 18.5 % — ABNORMAL HIGH (ref 11.5–15.5)
WBC: 6.7 10*3/uL (ref 4.0–10.5)

## 2014-12-13 MED ORDER — VITAL AF 1.2 CAL PO LIQD
1000.0000 mL | ORAL | Status: DC
  Administered 2014-12-13 – 2014-12-17 (×5): 1000 mL
  Filled 2014-12-13 (×6): qty 1000

## 2014-12-13 MED ORDER — VITAL AF 1.2 CAL PO LIQD
1000.0000 mL | ORAL | Status: DC
  Administered 2014-12-13: 1000 mL
  Filled 2014-12-13: qty 1000

## 2014-12-13 MED ORDER — SACCHAROMYCES BOULARDII 250 MG PO CAPS
250.0000 mg | ORAL_CAPSULE | Freq: Two times a day (BID) | ORAL | Status: DC
  Administered 2014-12-13 – 2014-12-21 (×17): 250 mg via ORAL
  Filled 2014-12-13 (×17): qty 1

## 2014-12-13 NOTE — Progress Notes (Signed)
[facility] PROGRESS NOTE  [name] [place] ZOX:[id number] DOB: Apr 29, 1946 DOA: 11/29/2014 PCP: [name], MD  Brief narrative: 68 y/o male admitted on 12/16 with a GI bleed. Underwent endoscopy on 12/17 and was found to have a bleeding duodenal ulcer which rapidly progressed and he was taken to the OR on 12/17 emergently.There, he had an emergency ex laparotomy, oversewing of a posterior bleeding duodenal ulcer, pyloroplasty, cholecystectomy, repair of R hepatic artery, repair of cholecystoduodenal fistula and placement of a duodenostomy tube and feeding J-tube. PCCM consulted for post op ventilator management.  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 Pneumonia 12/21 dark stool, Hgb stable 12/22 no more dark stool, c.diff pcr positive 12/25 - transferred from SDU to tele bed, care transferred to [facility] from [facility]  12/29 - ? 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 [name] - POD #13 Emergency Ex Lap, oversewing of posterior bleeding DU, Heineke Mikulicz pyloroplasty, cholecystectomy, repair of right hepatic artery, repair of cholecystoduodenal fistula, placement of duodenostomy tube, feeding jejunostomy. 11/30/14, [name]  - management per surgery team  - no bleeding since 12/05/2014 - Tolerating clears started on 12/29 - continue duodenostomy tube clamping - clean and redress duodenostomy site more frequently - continue to  mobilize.   Active problems HTN with persistent tachycardia  -Still tachycardic in low 100s. Continue when necessary labetalol.      Anemia and thrombocytopenia Hemoglobin of 7 this morning. Transfuse if less than 7. Continue PPI. thrombocytopenia resuolved  Acute respiratory failure with hypoxemia - atelectasis, resolved  Stage IIIB Squamous Cell Lung cancer - dx 07/2014, Rx carboplatin and AUC 08/2014 - VDRF, extubated (since 12/18), respiratory status stable  - continue pulm hygiene: IS, and ambulation - O2 as needed for O2 saturation > 90% - continue symbicort and xopenex PRN  C. difficile  colitis Started on 2 weeks course of oral vancomycin since 12/23 ( day 9/14). Still having ongoing diarrhea. Ask nutritionist to change the feeding to VITAL. Continue florastar  Metabolic acidosis  - with hypokalemia and hypomagnesemia -Replenished -Now resolved  Protein calorie malnutrition, severe  Tolerating clear liquid. Continue nutritional supplement   ? UTI Urine culture pending. On empiric Rocephin since 12/29  DM2  Continue sliding still insulin  Abdominal pain On when necessary morphine and Percocet   DVT prophylaxis - SCD's   Code Status: Full code Family Communication: Son at bedside Disposition Plan: Home once improved   Consultants:  GI  Surgery  Procedures:  Right IJ central line  PICC line  Surgery as outlined above  Antibiotics:  Oral Vancomycin since 12/23  IV Rocephin since 12/29  HPI/Subjective: Patient seen and examined. Still having watery diarrhea of 4-5 episodes. Abdominal pain better. Denies nausea or vomiting. Tolerating clears  Objective: Filed Vitals:   12/13/14 1357  BP: 96/58  Pulse: 113  Temp: 97.4 F (36.3 C)  Resp: 20    Intake/Output Summary (Last 24 hours) at 12/13/14 1839 Last data filed at 12/13/14 1553  Gross per 24 hour  Intake  2775 ml  Output      0 ml  Net   2775 ml   Filed Weights    12/04/14 0400 12/05/14 0400 12/07/14 0423  Weight: 72.7 kg (160 lb 4.4 oz) 71.4 kg (157 lb 6.5 oz) 71.3 kg (157 lb 3 oz)    Exam:   General:  Elderly thin built male in NAD  HEENT: pallor +, moist mucosa  Cardiovascular: NS1&S2, no edema  Respiratory: clear b/l,   Abdomen: soft, NT,ND, J-tube in place. Bowel sounds present  Musculoskeletal: Warm, no edema  CNS: Alert and oriented   Data Reviewed: Basic Metabolic Panel:  Recent Labs Lab 12/07/14 0415 12/08/14 0356 12/09/14 0452 12/10/14 0328 12/11/14 0414 12/12/14 0340 12/13/14 0405  NA 139 135 138 136 133* 134* 134*  K 3.9 4.4 4.6 4.7 3.9 3.6 3.6  CL 114* 112 116* 114* 111 107 101  CO2 21 17* 18* 18* 19 21 23   GLUCOSE 230* 179* 155* 154* 153* 155* 169*  BUN 16 13 11 11 9 8 7   CREATININE 0.62 0.60 0.64 0.73 0.59 0.70 0.68  CALCIUM 7.4* 7.3* 7.6* 7.7* 7.4* 7.4* 7.3*  MG 1.6 1.6 1.5  --   --   --   --    Liver Function Tests:  Recent Labs Lab 12/07/14 0415  AST 17  ALT 19  ALKPHOS 78  BILITOT 0.7  PROT 4.5*  ALBUMIN 1.6*    Recent Labs Lab 12/07/14 0415  AMYLASE 76   No results for input(s): AMMONIA in the last 168 hours. CBC:  Recent Labs Lab 12/09/14 0452 12/10/14 0328 12/11/14 0414 12/12/14 0340 12/13/14 0405  WBC 6.4 8.3 7.6 7.8 6.7  HGB 7.9* 8.3* 7.2* 7.2* 7.0*  HCT 25.3* 26.8* 22.6* 22.3* 22.3*  MCV 92.7 91.5 90.4 89.9 91.0  PLT 137* 177 147* 153 181   Cardiac Enzymes: No results for input(s): CKTOTAL, CKMB, CKMBINDEX, TROPONINI in the last 168 hours. BNP (last 3 results) No results for input(s): PROBNP in the last 8760 hours. CBG:  Recent Labs Lab 12/13/14 0039 12/13/14 0334 12/13/14 0740 12/13/14 1202 12/13/14 1648  GLUCAP 143* 177* 181* 125* 151*    Recent Results (from the past 240 hour(s))  Clostridium Difficile by PCR     Status: Abnormal   Collection Time: 12/04/14  6:23 PM  Result Value Ref Range Status   C difficile by pcr POSITIVE (A) NEGATIVE Final     Comment: CRITICAL RESULT CALLED TO, READ BACK BY AND VERIFIED WITH: [email or web address],RN 09/30/50 0258 BY BSLADE Performed at [facility]   Urine culture     Status: None (Preliminary result)   Collection Time: 12/12/14  1:18 AM  Result Value Ref Range Status   Specimen Description URINE, RANDOM  Final   Special Requests NONE  Final   Colony Count PENDING  Incomplete   Culture   Final    Culture reincubated for better growth Performed at [facility]    Report Status PENDING  Incomplete     Studies: No results found.  Scheduled Meds: . bismuth subsalicylate  30 mL Per Tube BID  . chlorhexidine  15 mL Mouth Rinse BID  . feeding supplement (VITAL AF 1.2 CAL)  1,000 mL Per Tube Q24H  . insulin aspart  0-15 Units Subcutaneous 6 times per day  . multivitamins with iron  1 tablet Oral QPC supper  . pantoprazole (PROTONIX) IV  40 mg Intravenous Q12H  . potassium chloride  40 mEq Per Tube TID  .  saccharomyces boulardii  250 mg Oral BID  . sodium chloride  10-40 mL Intracatheter Q12H  . sodium chloride  3 mL Intravenous Q12H  . vancomycin  125 mg Per Tube 4 times per day   Continuous Infusions: . sodium chloride 10 mL/hr at 12/09/14 2055  . sodium chloride 10 mL/hr at 12/05/14 0217      Time spent: 25 minutes    Louellen Molder  [facility] Pager [phone]. If 7PM-7AM, please contact night-coverage at [email or web address], password [facility] 12/13/2014, 6:39 PM  LOS: 14 days

## 2014-12-13 NOTE — Progress Notes (Signed)
13 Days Post-Op  Subjective: He is clearly discouraged with all the diarrhea, but no nausea with the duodenostomy tube clamped.  Wound is still red in the upper 1/2, so I took out all the staples in this area and used sterile applicator stick to open one site mid abdomen that drained some purulent fluid.  The remaining portions seemed to be well sealed, even with the localized cellulitis.    Objective: Vital signs in last 24 hours: Temp:  [98.7 F (37.1 C)-99.4 F (37.4 C)] 98.7 F (37.1 C) (12/30 0400) Pulse Rate:  [105-112] 105 (12/30 0400) Resp:  [16] 16 (12/30 0400) BP: (95-105)/(57-65) 105/65 mmHg (12/30 0400) SpO2:  [94 %] 94 % (12/30 0400) Last BM Date: 12/13/14 17 stools recorded On SIPS from the floor stock Afebrile, BP 95-100 range Labs OK, H/H is 7/22.3 Intake/Output from previous day: 12/29 0701 - 12/30 0700 In: 2225 [I.V.:590; NG/GT:1495; IV Piggyback:50] Out: 100 [Urine:100] Intake/Output this shift:    General appearance: alert, cooperative, no distress and tired. GI: soft, erythema and cellulitis upper half of the incision.  Opened one site and took staples from the upper half of the incision.  Wet to dry 2 x 2 in place.  Multiple BM's.  Lab Results:   Recent Labs  12/12/14 0340 12/13/14 0405  WBC 7.8 6.7  HGB 7.2* 7.0*  HCT 22.3* 22.3*  PLT 153 181    BMET  Recent Labs  12/12/14 0340 12/13/14 0405  NA 134* 134*  K 3.6 3.6  CL 107 101  CO2 21 23  GLUCOSE 155* 169*  BUN 8 7  CREATININE 0.70 0.68  CALCIUM 7.4* 7.3*   PT/INR No results for input(s): LABPROT, INR in the last 72 hours.   Recent Labs Lab 12/07/14 0415  AST 17  ALT 19  ALKPHOS 78  BILITOT 0.7  PROT 4.5*  ALBUMIN 1.6*     Lipase     Component Value Date/Time   LIPASE 41 11/29/2014 0644     Studies/Results: Ct Abdomen Pelvis W Contrast  12/11/2014   CLINICAL DATA:  68 year old male with history of a bleeding duodenal ulcer with cholecystoduodenal fistula status  post surgical repair. Additional history of non-small-cell lung cancer undergoing chemotherapy and radiation therapy (August 2015).  EXAM: CT ABDOMEN AND PELVIS WITH CONTRAST  TECHNIQUE: Multidetector CT imaging of the abdomen and pelvis was performed using the standard protocol following bolus administration of intravenous contrast.  CONTRAST:  25 mL OMNIPAQUE IOHEXOL 300 MG/ML SOLN, 165mL OMNIPAQUE IOHEXOL 300 MG/ML SOLN  COMPARISON:  PET-CT 07/26/2014.  FINDINGS: Lower chest: Small bilateral pleural effusions (right greater than left), layering dependently. Small amount of pericardial fluid and/or thickening, unlikely to be of hemodynamic significance at this time, but new compared to the prior examination.  Hepatobiliary: No cystic or solid hepatic lesions. No intra or extrahepatic biliary ductal dilatation. Status post cholecystectomy. Small linear appearing area of low attenuation in the inferior aspect of segment 3 of the liver (image 22 of series 2) may represent a small contusion. Beneath the cholecystectomy clips there is a 4.2 x 6.3 x 2.3 cm postoperative fluid collection which tracks inferiorly along the posterior and lateral surface of the duodenum, best appreciated on image 30 of series 2, and sagittal image 33.  Pancreas: Unremarkable. Specifically, no pancreatic ductal dilatation.  Spleen: Unremarkable.  Adrenals/Urinary Tract: Several sub cm low-attenuation lesions are noted in the kidneys bilaterally, too small to characterize, but statistically likely a tiny cysts. In addition, there is  a 1.4 cm simple cyst in the upper pole of the right kidney. 1.6 cm right adrenal nodule is unchanged, previously characterized as an adenoma. Left adrenal gland is normal in appearance. No hydroureteronephrosis. Urinary bladder is remarkable for extensive gas in the wall of the urinary bladder.  Stomach/Bowel: Normal appearance of the stomach. Duodenostomy tube in position with tip terminating in the second portion  of the duodenum. There is a small amount of extraluminal gas adjacent to the duodenostomy catheter immediately anterior to the duodenal bulb (image 26 of series 2 and sagittal image 42). Notably, although there is oral contrast material in the stomach and duodenum, there is no leakage of oral contrast material outside the duodenum. No larger volume of pneumoperitoneum is otherwise noted. Percutaneous jejunostomy tube noted. No pathologic dilatation of small bowel or colon. Diffuse thickening of the colonic and rectal walls, concerning for colitis. Inflammatory changes in the overlying mesocolon. 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  Vascular/Lymphatic: Moderate atherosclerosis throughout the abdominal and pelvic vasculature, without evidence of aneurysm or dissection.  Reproductive: Prostate gland and seminal vesicles are unremarkable in appearance.  Other: Small volume of ascites, most evident adjacent to the liver and spleen.  Musculoskeletal: Midline surgical clips in the anterior abdominal wall. There are no aggressive appearing lytic or blastic lesions noted in the visualized portions of the skeleton.  IMPRESSION: 1. Postoperative changes of cholecystectomy with the duodenostomy tube in position. There is a small amount of extraluminal gas adjacent to the duodenostomy tube, but no large volume of pneumoperitoneum, and no evidence of leakage oral contrast material from the duodenum at this time. In addition, there is a small amount of postoperative fluid tracking inferiorly from the gallbladder fossa immediately posterior and lateral to the second portion of the duodenum. This may simply represent a postoperative seroma, although the possibility of a biloma is not excluded. 2. Diffuse thickening of the wall of the colon and rectum, concerning for a colitis such as C difficile colitis. 3. Gas in the wall of the urinary bladder. The wall of the urinary bladder  otherwise does not appear inflamed. This may simply be iatrogenic related to recent Foley catheter placement, however, urinalysis and clinical correlation is recommended to exclude signs or symptoms of emphysematous cystitis. 4. Small bilateral pleural effusions (right greater than left), and small pericardial effusion and/or thickening, new compared to prior PET-CT 07/26/2014. 5. Additional incidental findings, as above. These results were called by telephone at the time of interpretation on 12/11/2014 at 11:10 am to Dr. Alphonsa Overall, who verbally acknowledged these results.   Electronically Signed   By: Vinnie Langton M.D.   On: 12/11/2014 11:13    Medications: . bismuth subsalicylate  30 mL Per Tube BID  . chlorhexidine  15 mL Mouth Rinse BID  . insulin aspart  0-15 Units Subcutaneous 6 times per day  . multivitamins with iron  1 tablet Oral QPC supper  . pantoprazole (PROTONIX) IV  40 mg Intravenous Q12H  . potassium chloride  40 mEq Per Tube TID  . sodium chloride  10-40 mL Intracatheter Q12H  . sodium chloride  3 mL Intravenous Q12H  . vancomycin  125 mg Per Tube 4 times per day    Assessment/Plan 1. 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 Ex Lap, oversewing of posterior bleeding DU, Heineke Mikulicz pyloroplasty, cholecystectomy, repair of right hepatic artery, repair of cholecystoduodenal fistula and  placement of duodenostomy tube, feeding jejunostomy - 11/30/14 - Dr. Jackolyn Confer 2. C diff colitis - He has had 5 days of vancomycin per Feeding tube 3. Malnutrition on tube feeding thru J tube  Prealbumin 14.3 on 12/12/14 4. Non small cell lung carcinoma, Stage IIIB (T3, N3, M0) undergoing chemotherapy and radiation therapy. (07/2014) 5. Anemia 6. AODM 7. Hypolakemia, Resolved. 8. Probable UTI    Plan:  Await culture to treat urine.  I have opened one site mid abdominal  incision and will wick with 2 x 2 and see how it does.  Clear liquid diet today and hope to advance to full liquids tomorrow.  If I can do that we will start weaning TF.  This will be day 8 of oral vancomycin.  Add probiotic also.  LOS: 14 days    Jevon Littlepage 12/13/2014

## 2014-12-13 NOTE — Progress Notes (Addendum)
NUTRITION FOLLOW UP/CONSULT  Intervention:   -Initiate Vital AF 1.2 @ NEW goal rate of 55 ml/hr via Jtube  Tube feeding regimen provides 1584 kcal (88% of needs), 100 grams of protein, and 1070 ml of H2O.   Vital AF 1.2 contains scFOS; a prebiotic fiber that promotes growth of beneficial bacteria of the colon; also may be beneficial for loose stools d/t lower volume goal rate  -Recommend Boost Plus BID with FL diet advancement -Diet advancement per surgery  -RD to continue to monitor  Nutrition Dx:   Inadequate oral intake related to nausea/taste changes as evidenced by PO intake < 75%, 50 lb weight loss; ongoing, now r/t to CL diet  Goal:   TF  + PO intake to meet >/= 90% of their estimated nutrition needs; progressing   Monitor:   TF tolerance, total protein/energy intake, labs, weight, GI profile  Assessment:   68 y.o. male with a PMHx of DM2, HLD, melanoma, NSCLC currently undergoing radiation/chemotherapy, who presents to the ED with complaints of lightheadedness , hematemesis, melena, syncopal episode with standing which has been ongoing for ~1week and caused him to fall back into the bathroom wall this morning around 3:30 am.  12/17: -Pt reported an unintentional wt loss of ~50 lbs since beginning of chemo and radiation treatments, approximately 8 months ago (25% body weight loss, severe for time frame) -Endorsed ongoing nausea and taste changes that have inhibited appetite. Pt consumes one Boost daily, and minimal intake of meals or snacks. Enjoys sweets and snacks vs meals -RD encouraged use of anti-emetics, as well as implementing use of oral rinses to assist with taste changes -Has been evaluated by outpatient oncology RD in 08/2014 for persistent nausea and vomiting. Was educated and provided nutrition handouts/resources. Has experienced ~10 lb weight loss since outpatient appointment. -Pt NPO for EGD d/t possible GI bleed. -RD to order Boost once daily + snacks w/diet  advancement. Pt willing to consume MagicCup and pudding inbetween meals  12/18: -Per MD note, pt with bleeding duodenal bulb ulcer and fistula; duodenostomy tube and Jtube was placed -Glucerna 1.2 at 20 ml/hr infusing at 20 ml/hr per surgery recommendations. Providing 576 kcal, 15 gram protein, 386 ml free water -CBG elevated -Day 1 post op.Patient is currently intubated on ventilator support MV: 10.8 L/min Temp (24hrs), Avg:98.5 F (36.9 C), Min:97.4 F (36.3 C), Max:99.4 F (37.4 C)  12/21: - Pt extubated 12/19. Tolerating TF at 40 ml/hr. TF advanced this am per surgery. Spoke with RN. Residuals unable to be checked due to collapse of tube upon attempt. Coffee ground material draining from NG tube, does not look like TF. Bowel movements are loose and black.   12/23: - Pt tolerating TF at goal rate.  - Pt having diarrhea due to C.Diff - Surgery is allowing pt to have ice chips, flavor over ice, and popsicle which pt is tolerating. He said that he had some yogurt? this morning which he liked and tolerated. He was asking when he will be able to have his diet upgraded.  Will continue to monitor surgery notes.   Labs: Na WNL K low BUN WNL Mg Low  12/30: -Pt tolerating Glucerna 1.2 at goal rate of 65 ml/hr Jtube with minimal residual -Received consult to modify tube feeding regimen d/t ongoing loose stools. Per discussion with RN, pt had been experiencing 10-12 loose stools daily; however this was improving and patient had only experienced 3 episodes this morning (~7am-11pm) -Pt with C.diff, and receiving antibiotics; both  of which likely contributing to loose stools -Diet advanced to Clear liquids, with plan to advance to full liquids tomorrow as tolerated, and eventual wean of TF -RN reported pt not candidate for anti-diarrheals d/t C.diff -Discussed concern with MD; who was in agreement to look into possible anti-diarrheals as warranted  Height: Ht Readings from Last 1 Encounters:   11/29/14 5\' 8"  (1.727 m)    Weight Status:   Wt Readings from Last 1 Encounters:  12/07/14 157 lb 3 oz (71.3 kg)    Re-estimated needs:  Kcal: 1800-2000 Protein: 85-100 gram Fluid: >/=2000 ml daily  Skin: closed incision on abdomen  Diet Order: Diet clear liquid   Intake/Output Summary (Last 24 hours) at 12/13/14 1411 Last data filed at 12/13/14 1300  Gross per 24 hour  Intake   2745 ml  Output      0 ml  Net   2745 ml    Last BM: 12/30   Labs:   Recent Labs Lab 12/07/14 0415 12/08/14 0356 12/09/14 0452  12/11/14 0414 12/12/14 0340 12/13/14 0405  NA 139 135 138  < > 133* 134* 134*  K 3.9 4.4 4.6  < > 3.9 3.6 3.6  CL 114* 112 116*  < > 111 107 101  CO2 21 17* 18*  < > 19 21 23   BUN 16 13 11   < > 9 8 7   CREATININE 0.62 0.60 0.64  < > 0.59 0.70 0.68  CALCIUM 7.4* 7.3* 7.6*  < > 7.4* 7.4* 7.3*  MG 1.6 1.6 1.5  --   --   --   --   GLUCOSE 230* 179* 155*  < > 153* 155* 169*  < > = values in this interval not displayed.  CBG (last 3)   Recent Labs  12/13/14 0334 12/13/14 0740 12/13/14 1202  GLUCAP 177* 181* 125*    Scheduled Meds: . bismuth subsalicylate  30 mL Per Tube BID  . chlorhexidine  15 mL Mouth Rinse BID  . insulin aspart  0-15 Units Subcutaneous 6 times per day  . multivitamins with iron  1 tablet Oral QPC supper  . pantoprazole (PROTONIX) IV  40 mg Intravenous Q12H  . potassium chloride  40 mEq Per Tube TID  . saccharomyces boulardii  250 mg Oral BID  . sodium chloride  10-40 mL Intracatheter Q12H  . sodium chloride  3 mL Intravenous Q12H  . vancomycin  125 mg Per Tube 4 times per day    Continuous Infusions: . sodium chloride 10 mL/hr at 12/09/14 2055  . sodium chloride 10 mL/hr at 12/05/14 0217  . feeding supplement (GLUCERNA 1.2 CAL) 1,000 mL (12/13/14 1051)    [place] LDN Clinical Dietitian HENID:[phone]

## 2014-12-14 DIAGNOSIS — D500 Iron deficiency anemia secondary to blood loss (chronic): Secondary | ICD-10-CM

## 2014-12-14 LAB — GLUCOSE, CAPILLARY
GLUCOSE-CAPILLARY: 151 mg/dL — AB (ref 70–99)
GLUCOSE-CAPILLARY: 124 mg/dL — AB (ref 70–99)
GLUCOSE-CAPILLARY: 127 mg/dL — AB (ref 70–99)
Glucose-Capillary: 147 mg/dL — ABNORMAL HIGH (ref 70–99)
Glucose-Capillary: 132 mg/dL — ABNORMAL HIGH (ref 70–99)

## 2014-12-14 LAB — CBC
HCT: 23.6 % — ABNORMAL LOW (ref 39.0–52.0)
Hemoglobin: 7.3 g/dL — ABNORMAL LOW (ref 13.0–17.0)
MCH: 28.3 pg (ref 26.0–34.0)
MCHC: 30.9 g/dL (ref 30.0–36.0)
MCV: 91.5 fL (ref 78.0–100.0)
PLATELETS: 198 10*3/uL (ref 150–400)
RBC: 2.58 MIL/uL — ABNORMAL LOW (ref 4.22–5.81)
RDW: 18.8 % — ABNORMAL HIGH (ref 11.5–15.5)
WBC: 7.5 10*3/uL (ref 4.0–10.5)

## 2014-12-14 LAB — PREPARE RBC (CROSSMATCH)

## 2014-12-14 MED ORDER — SODIUM CHLORIDE 0.9 % IV SOLN
Freq: Once | INTRAVENOUS | Status: AC
  Administered 2014-12-14: 11:00:00 via INTRAVENOUS

## 2014-12-14 NOTE — Progress Notes (Signed)
[facility] PROGRESS NOTE  [name] Levee QIH:[id number] DOB: 1946-03-22 DOA: 11/29/2014 PCP: [name], MD  Brief narrative: 68 y/o male admitted on 12/16 with a GI bleed. Underwent endoscopy on 12/17 and was found to have a bleeding duodenal ulcer which rapidly progressed and he was taken to the OR on 12/17 emergently.There, he had an emergency ex laparotomy, oversewing of a posterior bleeding duodenal ulcer, pyloroplasty, cholecystectomy, repair of R hepatic artery, repair of cholecystoduodenal fistula and placement of a duodenostomy tube and feeding J-tube. PCCM consulted for post op ventilator management.  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 Pneumonia 12/21 dark stool, Hgb stable 12/22 no more dark stool, c.diff pcr positive 12/25 - transferred from SDU to tele bed, care transferred to [facility] from [facility]  12/29 - ? 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 [name] - POD #13 Emergency Ex Lap, oversewing of posterior bleeding DU, Heineke Mikulicz pyloroplasty, cholecystectomy, repair of right hepatic artery, repair of cholecystoduodenal fistula, placement of duodenostomy tube, feeding jejunostomy. 11/30/14, [name]  - management per surgery team  - continue duodenostomy tube clamping with cleaning and dressing also did not ostomy site. -No active bleeding since 12/22 with hemoglobin low. Will transfuse 2 units PRBC today. Diet advanced  to full liquids.    Active problems HTN with persistent tachycardia  -Still tachycardic in low 100s. Possibly worsened by deconditioning and anemia. transfuse 2 units PRBC. Continue when necessary labetalol.    Anemia and thrombocytopenia Hb of 7.3. Ordered 2 U PRBC. Continue PPI. thrombocytopenia resuolved  Acute respiratory failure with hypoxemia - atelectasis, resolved  Stage IIIB Squamous Cell Lung cancer - dx 07/2014, Rx carboplatin and AUC 08/2014 - VDRF, extubated (since 12/18), respiratory fn stable  - continue pulm hygiene: IS, and ambulation - O2 as needed - continue symbicort and xopenex PRN  C. difficile  colitis Started on 2 weeks course of oral vancomycin since 12/23 ( day 10/14). changed the feeding to VITAL. Continue florastar. Diarrhea improved today.  Metabolic acidosis  - with hypokalemia and hypomagnesemia -Replenished   Protein calorie malnutrition, severe  . Continue nutritional supplement. Heart advanced to full liquid today.   ? UTI Limited urine culture growing gram-negative rods.. On empiric Rocephin since 12/29  DM2  Continue sliding still insulin  Abdominal pain On when necessary morphine and Percocet   DVT prophylaxis - SCD's   Code Status: Full code Family Communication: Son at bedside Disposition Plan: PT recommend skilled nursing facility. Not ready for discharge yet. Social work will discuss with patient and his wife regarding disposition.   Consultants:  GI  Surgery  Procedures:  Right IJ central line  PICC line  Surgery as outlined above  Antibiotics:  Oral Vancomycin since 12/23--  IV Rocephin since 12/29  HPI/Subjective: Patient seen and examined. Diarrhea improved to about 4-5 episodes in the past 24 hours.  Objective: Filed Vitals:   12/14/14 1205  BP: 91/62  Pulse: 107  Temp: 98.2 F (36.8 C)  Resp: 20    Intake/Output Summary (  Last 24 hours) at 12/14/14 1243 Last data filed at 12/14/14  1155  Gross per 24 hour  Intake  742.5 ml  Output      0 ml  Net  742.5 ml   Filed Weights   12/04/14 0400 12/05/14 0400 12/07/14 0423  Weight: 72.7 kg (160 lb 4.4 oz) 71.4 kg (157 lb 6.5 oz) 71.3 kg (157 lb 3 oz)    Exam:   General:  Elderly thin built male in NAD  HEENT: pallor +, moist mucosa  Cardiovascular: NS1&S2, no edema  Respiratory: clear b/l,   Abdomen: soft, NT,ND, J-tube in place. Dressing over the proximal to be site clean. Bowel sounds present  Musculoskeletal: Warm, no edema  CNS: Alert and oriented   Data Reviewed: Basic Metabolic Panel:  Recent Labs Lab 12/08/14 0356 12/09/14 0452 12/10/14 0328 12/11/14 0414 12/12/14 0340 12/13/14 0405  NA 135 138 136 133* 134* 134*  K 4.4 4.6 4.7 3.9 3.6 3.6  CL 112 116* 114* 111 107 101  CO2 17* 18* 18* 19 21 23   GLUCOSE 179* 155* 154* 153* 155* 169*  BUN 13 11 11 9 8 7   CREATININE 0.60 0.64 0.73 0.59 0.70 0.68  CALCIUM 7.3* 7.6* 7.7* 7.4* 7.4* 7.3*  MG 1.6 1.5  --   --   --   --    Liver Function Tests: No results for input(s): AST, ALT, ALKPHOS, BILITOT, PROT, ALBUMIN in the last 168 hours. No results for input(s): LIPASE, AMYLASE in the last 168 hours. No results for input(s): AMMONIA in the last 168 hours. CBC:  Recent Labs Lab 12/10/14 0328 12/11/14 0414 12/12/14 0340 12/13/14 0405 12/14/14 0440  WBC 8.3 7.6 7.8 6.7 7.5  HGB 8.3* 7.2* 7.2* 7.0* 7.3*  HCT 26.8* 22.6* 22.3* 22.3* 23.6*  MCV 91.5 90.4 89.9 91.0 91.5  PLT 177 147* 153 181 198   Cardiac Enzymes: No results for input(s): CKTOTAL, CKMB, CKMBINDEX, TROPONINI in the last 168 hours. BNP (last 3 results) No results for input(s): PROBNP in the last 8760 hours. CBG:  Recent Labs Lab 12/13/14 2013 12/13/14 2320 12/14/14 0358 12/14/14 0739 12/14/14 1206  GLUCAP 123* 114* 147* 132* 151*    Recent Results (from the past 240 hour(s))  Clostridium Difficile by PCR     Status: Abnormal   Collection Time: 12/04/14  6:23 PM   Result Value Ref Range Status   C difficile by pcr POSITIVE (A) NEGATIVE Final    Comment: CRITICAL RESULT CALLED TO, READ BACK BY AND VERIFIED WITH: [email or web address],RN 38/18/29 [id number] BY BSLADE Performed at [facility]   Urine culture     Status: None (Preliminary result)   Collection Time: 12/12/14  1:18 AM  Result Value Ref Range Status   Specimen Description URINE, RANDOM  Final   Special Requests NONE  Final   Colony Count   Final    >=100,000 COLONIES/ML Performed at [place] Performed at [facility]    Report Status PENDING  Incomplete     Studies: No results found.  Scheduled Meds: . bismuth subsalicylate  30 mL Per Tube BID  . chlorhexidine  15 mL Mouth Rinse BID  . feeding supplement (VITAL AF 1.2 CAL)  1,000 mL Per Tube Q24H  . insulin aspart  0-15 Units Subcutaneous 6 times per day  . multivitamins with iron  1 tablet Oral QPC supper  . pantoprazole (PROTONIX) IV  40 mg Intravenous Q12H  . potassium chloride  40 mEq Per Tube TID  . saccharomyces boulardii  250 mg Oral BID  . sodium chloride  10-40 mL Intracatheter Q12H  . sodium chloride  3 mL Intravenous Q12H  . vancomycin  125 mg Per Tube 4 times per day   Continuous Infusions: . sodium chloride 10 mL/hr at 12/09/14 2055  . sodium chloride 10 mL/hr at 12/05/14 0217      Time spent: 25 minutes    Louellen Molder  [facility] Pager [phone]. If 7PM-7AM, please contact night-coverage at [email or web address], password [facility] 12/14/2014, 12:43 PM  LOS: 15 days

## 2014-12-14 NOTE — Progress Notes (Signed)
PT Cancellation Note  Patient Details Name: Daryl Vega MRN: [id number] DOB: 1946-10-21   Cancelled Treatment:    Reason Eval/Treat Not Completed: Pain limiting ability to participate. Attempted PT tx session-pt requested PT check back on tomorrow.    Weston Anna, MPT Pager: [phone]

## 2014-12-14 NOTE — Progress Notes (Signed)
14 Days Post-Op  Subjective: He is doing well with the clears, and duodenostomy tube clamped.  His diarrhea is better, but he hasn't seemed to notice yet.  He still looks frail and tired.  Objective: Vital signs in last 24 hours: Temp:  [97.4 F (36.3 C)-98.4 F (36.9 C)] 97.8 F (36.6 C) (12/31 0520) Pulse Rate:  [100-113] 107 (12/31 0520) Resp:  [16-20] 16 (12/31 0520) BP: (88-110)/(58-70) 110/70 mmHg (12/31 0520) SpO2:  [91 %-97 %] 92 % (12/31 0520) Last BM Date: 12/13/14 4 BM's recorded yesterday  Really more like 8-10. 570 PO  -  Clear liquids Afebrile, BP down to 88 last PM but better in about 1 hour WBC is better, H/H stable Intake/Output from previous day: 12/30 0701 - 12/31 0700 In: 690 [P.O.:570] Out: -  Intake/Output this shift: Total I/O In: 30 [Other:30] Out: -   General appearance: alert, cooperative, no distress and tired. Resp: some rales and decreased BS in the bases. GI: soft, few BS, Open wound not changed since I did it yesterday, but the cellulits looks better.  Tube sites OK.  Lab Results:   Recent Labs  12/13/14 0405 12/14/14 0440  WBC 6.7 7.5  HGB 7.0* 7.3*  HCT 22.3* 23.6*  PLT 181 198    BMET  Recent Labs  12/12/14 0340 12/13/14 0405  NA 134* 134*  K 3.6 3.6  CL 107 101  CO2 21 23  GLUCOSE 155* 169*  BUN 8 7  CREATININE 0.70 0.68  CALCIUM 7.4* 7.3*   PT/INR No results for input(s): LABPROT, INR in the last 72 hours.  No results for input(s): AST, ALT, ALKPHOS, BILITOT, PROT, ALBUMIN in the last 168 hours.   Lipase     Component Value Date/Time   LIPASE 41 11/29/2014 0644     Studies/Results: No results found.  Medications: . bismuth subsalicylate  30 mL Per Tube BID  . chlorhexidine  15 mL Mouth Rinse BID  . feeding supplement (VITAL AF 1.2 CAL)  1,000 mL Per Tube Q24H  . insulin aspart  0-15 Units Subcutaneous 6 times per day  . multivitamins with iron  1 tablet Oral QPC supper  . pantoprazole (PROTONIX) IV  40  mg Intravenous Q12H  . potassium chloride  40 mEq Per Tube TID  . saccharomyces boulardii  250 mg Oral BID  . sodium chloride  10-40 mL Intracatheter Q12H  . sodium chloride  3 mL Intravenous Q12H  . vancomycin  125 mg Per Tube 4 times per day    Assessment/Plan 1. 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 Ex Lap, oversewing of posterior bleeding DU, Heineke Mikulicz pyloroplasty, cholecystectomy, repair of right hepatic artery, repair of cholecystoduodenal fistula and placement of duodenostomy tube, feeding jejunostomy - 11/30/14 - Dr. Jackolyn Confer 2. C diff colitis - He has had 5 days of vancomycin per Feeding tube 3. Malnutrition on tube feeding thru J tube Prealbumin 14.3 on 12/12/14 4. Non small cell lung carcinoma, Stage IIIB (T3, N3, M0) undergoing chemotherapy and radiation therapy. (07/2014) 5. Anemia 6. AODM 7. Hypolakemia, Resolved. 8. Probable UTI    Plan:  I am going to up his diet to full liquids, and if he does well he can go to soft diet in the AM.  At that point we can wean his TF even further.  TF changed last PM and hopefully this will Help.  Dr. Clementeen Graham and I talked about him.  Dr. Clementeen Graham is going to transfuse him.  This will help with his energy and low BP, make it easier to be mobile once the diarrhea is under control.  i have suggested to nursing staff he is getting better. We need to start discussions of where to send him at discharge; home with family vs SNF post hospitalization.  He is not a candidate for LTAC.    LOS: 15 days    Daryl Vega 12/14/2014

## 2014-12-15 LAB — GLUCOSE, CAPILLARY
GLUCOSE-CAPILLARY: 143 mg/dL — AB (ref 70–99)
Glucose-Capillary: 141 mg/dL — ABNORMAL HIGH (ref 70–99)
Glucose-Capillary: 157 mg/dL — ABNORMAL HIGH (ref 70–99)
Glucose-Capillary: 167 mg/dL — ABNORMAL HIGH (ref 70–99)
Glucose-Capillary: 168 mg/dL — ABNORMAL HIGH (ref 70–99)
Glucose-Capillary: 177 mg/dL — ABNORMAL HIGH (ref 70–99)
Glucose-Capillary: 95 mg/dL (ref 70–99)

## 2014-12-15 LAB — TYPE AND SCREEN
ABO/RH(D): B POS
ANTIBODY SCREEN: NEGATIVE
UNIT DIVISION: 0
Unit division: 0

## 2014-12-15 LAB — CBC
HEMATOCRIT: 32.3 % — AB (ref 39.0–52.0)
Hemoglobin: 10 g/dL — ABNORMAL LOW (ref 13.0–17.0)
MCH: 28 pg (ref 26.0–34.0)
MCHC: 31 g/dL (ref 30.0–36.0)
MCV: 90.5 fL (ref 78.0–100.0)
Platelets: 229 10*3/uL (ref 150–400)
RBC: 3.57 MIL/uL — ABNORMAL LOW (ref 4.22–5.81)
RDW: 17.6 % — AB (ref 11.5–15.5)
WBC: 8.3 10*3/uL (ref 4.0–10.5)

## 2014-12-15 MED ORDER — PANTOPRAZOLE SODIUM 40 MG PO TBEC
40.0000 mg | DELAYED_RELEASE_TABLET | Freq: Two times a day (BID) | ORAL | Status: DC
  Administered 2014-12-15 – 2014-12-21 (×12): 40 mg via ORAL
  Filled 2014-12-15 (×15): qty 1

## 2014-12-15 NOTE — Progress Notes (Signed)
CSW met with spouse at bedside to assist with d/c planning. Pt sleeping soundly. Rehab placement discussed with spouse. She will speak with her husband about this plan and contact csw with their decision. SNF list and csw cell # provided.  Werner Lean LCSW [phone]

## 2014-12-15 NOTE — Progress Notes (Signed)
Physical Therapy Treatment Patient Details Name: Daryl Vega MRN: [id number] DOB: 31-Aug-1946 Today's Date: 12/15/2014    History of Present Illness 69 y/o male admitted on 12/16 with a GI bleed.  Underwent endoscopy on 12/17 and was found to have a bleeding duodenal ulcer which rapidly progressed and he was taken to the OR on 12/17 emergently. There, he had an emergency ex laparotomy, oversewing of a posterior bleeding duodenal ulcer, pyloroplasty, cholecystectomy, repair of R hepatic artery, repair of cholecystoduodenal fistula and placement of a duodenostomy tube and feeding J-tube.  PCCM consulted for post op ventilator management.    PT Comments    **Increased gait distance today. Pain limits activity tolerance. *  Follow Up Recommendations  Home health PT;Supervision/Assistance - 24 hour     Equipment Recommendations  Rolling walker with 5" wheels    Recommendations for Other Services       Precautions / Restrictions Precautions Precautions: Fall Precaution Comments: Multiple drains, tubes and catheters Restrictions Weight Bearing Restrictions: No Other Position/Activity Restrictions: WBAT    Mobility  Bed Mobility Overal bed mobility: Modified Independent             General bed mobility comments: with rail  Transfers Overall transfer level: Needs assistance Equipment used: Rolling walker (2 wheeled) Transfers: Sit to/from Stand Sit to Stand: Min guard         General transfer comment: cues for hand placement  Ambulation/Gait Ambulation/Gait assistance: Min guard Ambulation Distance (Feet): 200 Feet Assistive device: Rolling walker (2 wheeled) Gait Pattern/deviations: Decreased stride length;Step-through pattern Gait velocity: decr   General Gait Details: 2-3/4 DOE, HR 113 walking   Stairs            Wheelchair Mobility    Modified Rankin (Stroke Patients Only)       Balance                                     Cognition Arousal/Alertness: Awake/alert Behavior During Therapy: WFL for tasks assessed/performed Overall Cognitive Status: Within Functional Limits for tasks assessed                      Exercises      General Comments        Pertinent Vitals/Pain Pain Score: 9  Pain Location: abdomen Pain Descriptors / Indicators: Sore Pain Intervention(s): Limited activity within patient's tolerance;Monitored during session;Premedicated before session    Home Living                      Prior Function            PT Goals (current goals can now be found in the care plan section) Acute Rehab PT Goals Patient Stated Goal: walk PT Goal Formulation: With patient Time For Goal Achievement: 12/19/14 Potential to Achieve Goals: Good Progress towards PT goals: Progressing toward goals    Frequency  Min 3X/week    PT Plan Current plan remains appropriate    Co-evaluation             End of Session   Activity Tolerance: Patient limited by fatigue;Patient limited by pain Patient left: in chair;with call bell/phone within reach     Time: 1335-1352 PT Time Calculation (min) (ACUTE ONLY): 17 min  Charges:  $Gait Training: 8-22 mins  G Codes:      Daryl Vega 12/15/2014, 2:02 PM [phone]

## 2014-12-15 NOTE — Progress Notes (Signed)
[facility] PROGRESS NOTE  Daryl Vega LFY:[id number] DOB: September 16, 1946 DOA: 11/29/2014 PCP: [name], MD  Brief narrative: 69 y/o male admitted on 12/16 with a GI bleed. Underwent endoscopy on 12/17 and was found to have a bleeding duodenal ulcer which rapidly progressed and he was taken to the OR on 12/17 emergently.There, he had an emergency ex laparotomy, oversewing of a posterior bleeding duodenal ulcer, pyloroplasty, cholecystectomy, repair of R hepatic artery, repair of cholecystoduodenal fistula and placement of a duodenostomy tube and feeding J-tube. PCCM consulted for post op ventilator management.  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 Pneumonia 12/21 dark stool, Hgb stable 12/22 no more dark stool, c.diff pcr positive 12/25 - transferred from SDU to tele bed, care transferred to [facility] from [facility]  12/29 - ? 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 [name] - POD #14 Emergency Ex Lap, oversewing of posterior bleeding DU, Heineke Mikulicz pyloroplasty, cholecystectomy, repair of right hepatic artery, repair of cholecystoduodenal fistula, placement of duodenostomy tube, feeding jejunostomy. 11/30/14, [name]. - management per surgery team  - continue duodenostomy tube clamping with cleaning and dressing also did not ostomy site. -No active bleeding since 12/22, but still having low hemoglobin . Transfuse 2 units PRBC on 12/31  with improvement  -Tolerating diet advanced to full liquid. Possibly advance to soft diet in a.m.   Active problems HTN with persistent tachycardia  Heart rate better today after blood transfusion. Continue when necessary labetalol    Anemia and thrombocytopenia Hb in 7s. Improved to 10  after 2U prbc on 12/31.  Continue PPI. thrombocytopenia resuolved  Acute respiratory failure with hypoxemia  - atelectasis, resolved   Stage IIIB Squamous Cell Lung cancer - dx 07/2014, Rx carboplatin and AUC 08/2014 - VDRF, extubated (since 12/18), respiratory fn stable  - continue pulm hygiene: IS, and ambulation - O2 as needed - continue symbicort and xopenex PRN  C. difficile  colitis Started on 2 weeks course of oral vancomycin since 12/23 ( day 11/14). changed the feeding to VITAL. Continue florastar. Diarrhea slowly improving.  Metabolic acidosis  - with hypokalemia and hypomagnesemia. -Replenished   Protein calorie malnutrition, severe  . Continue nutritional supplement. Heart advanced to full liquid today.   UTI cx growing GNR. On empiric Rocephin since 12/29  DM2  Continue sliding still insulin  Abdominal pain  when necessary morphine and Percocet   DVT prophylaxis - SCD's   Code Status: Full code Family Communication: wife  at bedside Disposition Plan: Home with home health possibly early next week   Consultants:  GI  Surgery  Procedures:  Right IJ central line  PICC line  Surgery as outlined above  Antibiotics:  Oral Vancomycin since 12/23--  IV Rocephin since 12/29  HPI/Subjective: Patient seen and examined. Still has 4-5 episodes of diarrhea. Reports feeling stronger after the receiving PRBC.  Objective: Filed Vitals:   12/15/14 0456  BP: 105/65  Pulse: 100  Temp: 98.1 F (36.7 C)  Resp: 18    Intake/Output Summary (Last  24 hours) at 12/15/14 1423 Last data filed at 12/15/14 0900  Gross per 24 hour  Intake  682.5 ml  Output       0 ml  Net  682.5 ml   Filed Weights   12/04/14 0400 12/05/14 0400 12/07/14 0423  Weight: 72.7 kg (160 lb 4.4 oz) 71.4 kg (157 lb 6.5 oz) 71.3 kg (157 lb 3 oz)    Exam:   General:  Elderly thin built male in NAD  HEENT: pallor +, moist mucosa  Cardiovascular: NS1&S2, no edema  Respiratory: clear b/l,   Abdomen: soft, NT,ND, J-tube in place. Dressing over the laparotomy site clean. Duodenostomy tube plugged. Bowel sounds present  Musculoskeletal: Warm, no edema  CNS: Alert and oriented   Data Reviewed: Basic Metabolic Panel:  Recent Labs Lab 12/09/14 0452 12/10/14 0328 12/11/14 0414 12/12/14 0340 12/13/14 0405  NA 138 136 133* 134* 134*  K 4.6 4.7 3.9 3.6 3.6  CL 116* 114* 111 107 101  CO2 18* 18* 19 21 23   GLUCOSE 155* 154* 153* 155* 169*  BUN 11 11 9 8 7   CREATININE 0.64 0.73 0.59 0.70 0.68  CALCIUM 7.6* 7.7* 7.4* 7.4* 7.3*  MG 1.5  --   --   --   --    Liver Function Tests: No results for input(s): AST, ALT, ALKPHOS, BILITOT, PROT, ALBUMIN in the last 168 hours. No results for input(s): LIPASE, AMYLASE in the last 168 hours. No results for input(s): AMMONIA in the last 168 hours. CBC:  Recent Labs Lab 12/11/14 0414 12/12/14 0340 12/13/14 0405 12/14/14 0440 12/15/14 0555  WBC 7.6 7.8 6.7 7.5 8.3  HGB 7.2* 7.2* 7.0* 7.3* 10.0*  HCT 22.6* 22.3* 22.3* 23.6* 32.3*  MCV 90.4 89.9 91.0 91.5 90.5  PLT 147* 153 181 198 229   Cardiac Enzymes: No results for input(s): CKTOTAL, CKMB, CKMBINDEX, TROPONINI in the last 168 hours. BNP (last 3 results) No results for input(s): PROBNP in the last 8760 hours. CBG:  Recent Labs Lab 12/14/14 2014 12/15/14 0012 12/15/14 0408 12/15/14 0730 12/15/14 1159  GLUCAP 127* 177* 141* 157* 167*    Recent Results (from the past 240 hour(s))  Urine culture     Status: None (Preliminary result)   Collection Time: 12/12/14  1:18 AM  Result Value Ref Range Status   Specimen Description URINE, RANDOM  Final    Special Requests NONE  Final   Colony Count   Final    >=100,000 COLONIES/ML Performed at [facility]    Culture   Final    [place] Performed at [facility]    Report Status PENDING  Incomplete     Studies: No results found.  Scheduled Meds: . bismuth subsalicylate  30 mL Per Tube BID  . chlorhexidine  15 mL Mouth Rinse BID  . feeding supplement (VITAL AF 1.2 CAL)  1,000 mL Per Tube Q24H  . insulin aspart  0-15 Units Subcutaneous 6 times per day  . multivitamins with iron  1 tablet Oral QPC supper  . pantoprazole (PROTONIX) IV  40 mg Intravenous Q12H  . potassium chloride  40 mEq Per Tube TID  . saccharomyces boulardii  250 mg Oral BID  . sodium chloride  10-40 mL Intracatheter Q12H  . sodium chloride  3 mL Intravenous Q12H  . vancomycin  125 mg Per Tube 4 times per day   Continuous Infusions: . sodium chloride 10 mL/hr at 12/09/14 2055  . sodium chloride 10 mL/hr  at 12/05/14 0217      Time spent: 25 minutes    Louellen Molder  [facility] Pager [phone]. If 7PM-7AM, please contact night-coverage at [email or web address], password [facility] 12/15/2014, 2:23 PM  LOS: 16 days

## 2014-12-15 NOTE — Progress Notes (Signed)
PT Cancellation Note  Patient Details Name: Daryl Vega MRN: [id number] DOB: September 29, 1946   Cancelled Treatment:    Reason Eval/Treat Not Completed: Pain limiting ability to participate. Pt has had pain medication this morning, however reports he's hurting too much to attempt mobility. Will follow.    Blondell Reveal Kistler 12/15/2014, 11:08 AM  [phone]

## 2014-12-15 NOTE — Progress Notes (Signed)
Patient ID: Daryl Vega, male   DOB: [birth year], 69 y.o.   MRN: [id number]  General Surgery - [facility] - Progress Note  POD# 15  Subjective: Patient in bed, family at bedside.  Small diarrheal stools overnight.  Denies pain.  OOB some yesterday.  Received blood transfusion.  Objective: Vital signs in last 24 hours: Temp:  [98.1 F (36.7 C)-99.3 F (37.4 C)] 98.1 F (36.7 C) (01/01 0456) Pulse Rate:  [100-108] 100 (01/01 0456) Resp:  [18-20] 18 (01/01 0456) BP: (91-105)/(61-68) 105/65 mmHg (01/01 0456) SpO2:  [92 %-95 %] 95 % (01/01 0456) Last BM Date: 12/13/14  Intake/Output from previous day: 12/31 0701 - 01/01 0700 In: 615 [P.O.:480; I.V.:50; Blood:25] Out: -   Exam: HEENT - clear, not icteric Neck - soft Chest - clear bilaterally Cor - RRR, no murmur Abd - soft without distension; BS present; dressing dry; red rubber J-tube with TF's infusing; duodenostomy tube plugged Ext - no significant edema Neuro - grossly intact, no focal deficits  Lab Results:   Recent Labs  12/14/14 0440 12/15/14 0555  WBC 7.5 8.3  HGB 7.3* 10.0*  HCT 23.6* 32.3*  PLT 198 229     Recent Labs  12/13/14 0405  NA 134*  K 3.6  CL 101  CO2 23  GLUCOSE 169*  BUN 7  CREATININE 0.68  CALCIUM 7.3*    Studies/Results: No results found.  Assessment / Plan: 1. 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 Ex Lap, oversewing of posterior bleeding DU, Heineke Mikulicz pyloroplasty, cholecystectomy, repair of right hepatic artery, repair of cholecystoduodenal fistula and placement of duodenostomy tube, feeding jejunostomy - 11/30/14 - Dr. Jackolyn Confer 2. C diff colitis - improving 3. Malnutrition on tube feeding thru J tube 4. Non small cell lung carcinoma, Stage IIIB (T3, N3, M0) undergoing chemotherapy and radiation therapy. (07/2014) 5. Anemia 6.  AODM  Will follow with you.  No new surgical recommendations at this time.  Earnstine Regal, MD, [facility] Office: [phone]  12/15/2014

## 2014-12-15 NOTE — Progress Notes (Signed)
CSW assisting with d/c planning. PN reviewed. PT recommends HHPT , 24 / 7 supervision following hospital d/c. MD notes pt will d/c home possibly early next week. RNCM will assist with d/c planning. CSW will sign off at this time.  Werner Lean LCSW [phone]

## 2014-12-16 LAB — GLUCOSE, CAPILLARY
GLUCOSE-CAPILLARY: 155 mg/dL — AB (ref 70–99)
Glucose-Capillary: 134 mg/dL — ABNORMAL HIGH (ref 70–99)
Glucose-Capillary: 205 mg/dL — ABNORMAL HIGH (ref 70–99)
Glucose-Capillary: 90 mg/dL (ref 70–99)
Glucose-Capillary: 143 mg/dL — ABNORMAL HIGH (ref 70–99)

## 2014-12-16 NOTE — Progress Notes (Signed)
Patient ID: Daryl Vega, male   DOB: 1946/07/11, 69 y.o.   MRN: [id number]  General Surgery - [facility] - Progress Note  POD# 16  Subjective: Patient with mild epigastric pain this AM.  Tolerating liquid diet without nausea or emesis.  TF's infusing.  Wound care BID.  Objective: Vital signs in last 24 hours: Temp:  [97.5 F (36.4 C)-98.9 F (37.2 C)] 98.5 F (36.9 C) (01/02 0436) Pulse Rate:  [99-101] 99 (01/02 0436) Resp:  [20] 20 (01/02 0436) BP: (97-111)/(54-68) 111/54 mmHg (01/02 0436) SpO2:  [95 %-97 %] 95 % (01/02 0436) Last BM Date: 12/15/14  Intake/Output from previous day: 01/01 0701 - 01/02 0700 In: 75 [P.O.:240; I.V.:220; NG/GT:360] Out: 300 [Urine:300]  Exam: HEENT - clear, not icteric Neck - soft Abd - dressing dry and intact; J-tube and duodenostomy tube sites clear; soft without distension; minimal tenderness to palpation Ext - no significant edema Neuro - grossly intact, no focal deficits  Lab Results:   Recent Labs  12/14/14 0440 12/15/14 0555  WBC 7.5 8.3  HGB 7.3* 10.0*  HCT 23.6* 32.3*  PLT 198 229    No results for input(s): NA, K, CL, CO2, GLUCOSE, BUN, CREATININE, CALCIUM in the last 72 hours.  Studies/Results: No results found.  Assessment / Plan: 1. Bleeding duodenal ulcer with cholecystoduodenal fistula Ex Lap, oversewing of posterior bleeding DU, pyloroplasty, cholecystectomy, repair of right hepatic artery, repair of cholecystoduodenal fistula and placement of duodenostomy tube, feeding jejunostomy - 11/30/14 - Dr. Jackolyn Confer 2. C diff colitis - improving - no diarrhea overnight 3. Malnutrition - TF's, advancing diet today 4. Non small cell lung carcinoma, Stage IIIB (T3, N3, M0) undergoing chemotherapy and radiation therapy. (07/2014) 5. Anemia 6. AODM  Earnstine Regal, MD, [facility] Office: [phone]  12/16/2014

## 2014-12-16 NOTE — Progress Notes (Signed)
[facility] PROGRESS NOTE  [name] Violette OJJ:[id number] DOB: 03-28-1946 DOA: 11/29/2014 PCP: [name], MD  Brief narrative: 69 y/o male admitted on 12/16 with a GI bleed. Underwent endoscopy on 12/17 and was found to have a bleeding duodenal ulcer which rapidly progressed and he was taken to the OR on 12/17 emergently.There, he had an emergency ex laparotomy, oversewing of a posterior bleeding duodenal ulcer, pyloroplasty, cholecystectomy, repair of R hepatic artery, repair of cholecystoduodenal fistula and placement of a duodenostomy tube and feeding J-tube. PCCM consulted for post op ventilator management.  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 Pneumonia 12/21 dark stool, Hgb stable 12/22 no more dark stool, c.diff pcr positive 12/25 - transferred from SDU to tele bed, care transferred to [facility] from [facility]  12/29 - ? 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 [name] - POD #14 Emergency Ex Lap, oversewing of posterior bleeding DU, Heineke Mikulicz pyloroplasty, cholecystectomy, repair of right hepatic artery, repair of cholecystoduodenal fistula, placement of duodenostomy tube, feeding jejunostomy. 11/30/14, [name]. - management per surgery team  - continue duodenostomy tube clamping with cleaning and dressing  of ostomy site. -No active bleeding since 12/22, transfused 2 units PRBC on 12/31 for low hemoglobin with improvement -Diet  advanced to soft today.   Active problems HTN with persistent tachycardia  Heart rate improved after blood transfusion. Continue when necessary labetalol  C. difficile  colitis Started on 2 weeks course of oral vancomycin since 12/23 ( day 12/14). changed the feeding to VITAL. Continue florastar. Diarrhea  improving.  Anemia and thrombocytopenia Hb in 7s. Improved to 10  after 2U prbc on 12/31.  Continue PPI. thrombocytopenia resolved  Acute respiratory failure with hypoxemia  - atelectasis, resolved   Stage IIIB Squamous Cell Lung cancer - dx 07/2014, Rx carboplatin and AUC 08/2014 - VDRF, extubated (since 12/18), respiratory fn stable  - continue pulm hygiene: IS, and ambulation - O2 as needed - continue symbicort and xopenex PRN    Metabolic acidosis  - with hypokalemia and hypomagnesemia. -Replenished   Protein calorie malnutrition, severe  . Continue nutritional supplement. Diet advanced to soft.   UTI cx growing GNR. On empiric Rocephin since 12/29, will treat for 7 day course  DM2  Continue sliding still insulin  Abdominal pain  when necessary morphine and Percocet   DVT prophylaxis - SCD's   Code Status: Full code Family Communication: Son at bedside Disposition Plan: Home with home health possibly early next week   Consultants:  GI  Surgery  Procedures:  Right IJ central line  PICC line  Surgery as outlined above  Antibiotics:  Oral Vancomycin since 12/23--  IV Rocephin since 12/29  HPI/Subjective: Patient seen and examined. Ports 2-3 episodes of diarrhea over past 24 hours and improved   Objective: Filed Vitals:   12/16/14 1325  BP: 110/62  Pulse: 105  Temp: 98.4 F (36.9 C)  Resp: 20    Intake/Output Summary (Last 24 hours) at 12/16/14 1546 Last data filed at 12/16/14 0900  Gross per 24 hour  Intake    820 ml  Output    300 ml  Net    520 ml   Filed Weights   12/04/14 0400 12/05/14 0400 12/07/14 0423   Weight: 72.7 kg (160 lb 4.4 oz) 71.4 kg (157 lb 6.5 oz) 71.3 kg (157 lb 3 oz)    Exam:   General:  Elderly thin built male in NAD  HEENT:  moist mucosa  Cardiovascular: NS1&S2, no murmurs  Respiratory: clear b/l,   Abdomen: soft, NT,ND, J-tube in place. Dressing over the laparotomy site clean. Duodenostomy tube plugged. Bowel sounds present  Musculoskeletal: Warm, no edema  CNS: Alert and oriented   Data Reviewed: Basic Metabolic Panel:  Recent Labs Lab 12/10/14 0328 12/11/14 0414 12/12/14 0340 12/13/14 0405  NA 136 133* 134* 134*  K 4.7 3.9 3.6 3.6  CL 114* 111 107 101  CO2 18* 19 21 23   GLUCOSE 154* 153* 155* 169*  BUN 11 9 8 7   CREATININE 0.73 0.59 0.70 0.68  CALCIUM 7.7* 7.4* 7.4* 7.3*   Liver Function Tests: No results for input(s): AST, ALT, ALKPHOS, BILITOT, PROT, ALBUMIN in the last 168 hours. No results for input(s): LIPASE, AMYLASE in the last 168 hours. No results for input(s): AMMONIA in the last 168 hours. CBC:  Recent Labs Lab 12/11/14 0414 12/12/14 0340 12/13/14 0405 12/14/14 0440 12/15/14 0555  WBC 7.6 7.8 6.7 7.5 8.3  HGB 7.2* 7.2* 7.0* 7.3* 10.0*  HCT 22.6* 22.3* 22.3* 23.6* 32.3*  MCV 90.4 89.9 91.0 91.5 90.5  PLT 147* 153 181 198 229   Cardiac Enzymes: No results for input(s): CKTOTAL, CKMB, CKMBINDEX, TROPONINI in the last 168 hours. BNP (last 3 results) No results for input(s): PROBNP in the last 8760 hours. CBG:  Recent Labs Lab 12/15/14 2023 12/15/14 2350 12/16/14 0433 12/16/14 0741 12/16/14 1153  GLUCAP 168* 143* 134* 155* 205*    Recent Results (from the past 240 hour(s))  Urine culture     Status: None (Preliminary result)   Collection Time: 12/12/14  1:18 AM  Result Value Ref Range Status   Specimen Description URINE, RANDOM  Final   Special Requests NONE  Final   Colony Count   Final    >=100,000 COLONIES/ML Performed at [facility]    Culture   Final    [place] Performed at [facility]    Report Status PENDING  Incomplete     Studies: No results found.  Scheduled Meds: . bismuth subsalicylate  30 mL Per Tube BID  . chlorhexidine  15 mL Mouth Rinse BID  . feeding supplement (VITAL AF 1.2 CAL)  1,000 mL Per Tube Q24H  . insulin aspart  0-15 Units Subcutaneous 6 times per day  . multivitamins with iron  1 tablet Oral QPC supper  . pantoprazole  40 mg Oral BID  . potassium chloride  40 mEq Per Tube TID  . saccharomyces boulardii  250 mg Oral BID  . sodium chloride  10-40 mL Intracatheter Q12H  . sodium chloride  3 mL Intravenous Q12H  . vancomycin  125 mg Per Tube 4 times per day   Continuous Infusions: . sodium chloride 10 mL/hr at 12/09/14 2055  . sodium chloride 10 mL/hr at 12/05/14 0217      Time spent: 25 minutes    Louellen Molder  [facility] Pager [phone]. If 7PM-7AM, please contact night-coverage at [email or web address], password [facility] 12/16/2014, 3:46 PM  LOS: 17 days

## 2014-12-17 DIAGNOSIS — N3000 Acute cystitis without hematuria: Secondary | ICD-10-CM

## 2014-12-17 LAB — GLUCOSE, CAPILLARY
GLUCOSE-CAPILLARY: 148 mg/dL — AB (ref 70–99)
GLUCOSE-CAPILLARY: 132 mg/dL — AB (ref 70–99)
GLUCOSE-CAPILLARY: 145 mg/dL — AB (ref 70–99)
Glucose-Capillary: 145 mg/dL — ABNORMAL HIGH (ref 70–99)
Glucose-Capillary: 162 mg/dL — ABNORMAL HIGH (ref 70–99)
Glucose-Capillary: 170 mg/dL — ABNORMAL HIGH (ref 70–99)

## 2014-12-17 LAB — URINE CULTURE

## 2014-12-17 LAB — CBC
HEMATOCRIT: 32.2 % — AB (ref 39.0–52.0)
Hemoglobin: 10 g/dL — ABNORMAL LOW (ref 13.0–17.0)
MCH: 28.7 pg (ref 26.0–34.0)
MCHC: 31.1 g/dL (ref 30.0–36.0)
MCV: 92.3 fL (ref 78.0–100.0)
Platelets: 242 10*3/uL (ref 150–400)
RBC: 3.49 MIL/uL — ABNORMAL LOW (ref 4.22–5.81)
RDW: 17.8 % — ABNORMAL HIGH (ref 11.5–15.5)
WBC: 7.9 10*3/uL (ref 4.0–10.5)

## 2014-12-17 MED ORDER — CIPROFLOXACIN HCL 500 MG PO TABS
500.0000 mg | ORAL_TABLET | Freq: Two times a day (BID) | ORAL | Status: DC
  Administered 2014-12-17 – 2014-12-21 (×9): 500 mg via ORAL
  Filled 2014-12-17 (×9): qty 1

## 2014-12-17 NOTE — Progress Notes (Signed)
[facility] PROGRESS NOTE  [name] Seevers ZOX:[id number] DOB: July 13, 1946 DOA: 11/29/2014 PCP: [name], MD  Brief narrative: 69 y/o male admitted on 12/16 with a GI bleed. Underwent endoscopy on 12/17 and was found to have a bleeding duodenal ulcer which rapidly progressed and he was taken to the OR on 12/17 emergently.There, he had an emergency ex laparotomy, oversewing of a posterior bleeding duodenal ulcer, pyloroplasty, cholecystectomy, repair of R hepatic artery, repair of cholecystoduodenal fistula and placement of a duodenostomy tube and feeding J-tube. PCCM consulted for post op ventilator management.  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 Pneumonia 12/21 dark stool, Hgb stable 12/22 no more dark stool, c.diff pcr positive 12/25 - transferred from SDU to tele bed, care transferred to [facility] from [facility]  12/29 - ? UTI based on UA,  12/30: pt having ongoing diarrhea 1/3: Urine culture growing Klebsiella, placed on ciprofloxacin. 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 [name] - POD #14 Emergency Ex Lap, oversewing of posterior bleeding DU, Heineke Mikulicz pyloroplasty, cholecystectomy, repair of right hepatic artery, repair of cholecystoduodenal fistula, placement of duodenostomy tube, feeding jejunostomy. 11/30/14, [name]. - management per surgery team  - continue duodenostomy tube clamping with cleaning and dressing  of ostomy site. -No active bleeding since 12/22, transfused 2 units PRBC on 12/31  for low hemoglobin with improvement -Diet advanced to soft and tolerating well.    Active problems HTN with persistent tachycardia  Heart rate improved after blood transfusion. Continue when necessary labetalol  C. difficile  colitis Started on 2 weeks course of oral vancomycin since 12/23 ( day 13/14). changed the feeding to VITAL. Continue florastar. Diarrhea  Resolved.    Anemia and thrombocytopenia Hb in 7s. Improved to 10  after 2U prbc on 12/31.  Continue PPI. thrombocytopenia resolved  Klebsiella UTI Urine culture from 12/29 growing Klebsiella oxytoca sensitive to quinolones. Placed on empiric Rocephin 1/3 for a 7 day course.  Acute respiratory failure with hypoxemia  - atelectasis, resolved   Stage IIIB Squamous Cell Lung cancer - dx 07/2014, Rx carboplatin and AUC 08/2014 - VDRF, extubated (since 12/18), respiratory fn stable  - continue pulm hygiene: IS, and ambulation - O2 as needed - continue symbicort and xopenex PRN    Metabolic acidosis  - with hypokalemia and hypomagnesemia. -Replenished   Protein calorie malnutrition, severe  . Continue nutritional supplement. Diet advanced to soft.     DM2  Continue sliding still insulin  Abdominal pain  when necessary morphine and Percocet   DVT prophylaxis - SCD's   Code Status: Full code Family Communication: none at bedside Disposition Plan: Home with home health possibly tomorrow.   Consultants:  GI  Surgery  Procedures:  Right IJ central line  PICC line  Surgery as outlined above  Antibiotics:  Oral Vancomycin since 12/23--  IV Rocephin since 12/29  HPI/Subjective: Patient seen and examined. Diarrhea resolved. Still has off and on abdominal pain.   Objective: Filed Vitals:   12/17/14 1031  BP: 105/62  Pulse: 103  Temp: 98.3 F (36.8 C)  Resp: 20  Intake/Output Summary (Last 24 hours) at 12/17/14 1323 Last data filed at 12/17/14 1100  Gross per 24 hour  Intake     420 ml  Output    300 ml  Net    120 ml   Filed Weights   12/04/14 0400 12/05/14 0400 12/07/14 0423  Weight: 72.7 kg (160 lb 4.4 oz) 71.4 kg (157 lb 6.5 oz) 71.3 kg (157 lb 3 oz)    Exam:   General:  Elderly thin built male in NAD  HEENT:  moist mucosa  Cardiovascular: NS1&S2, no murmurs  Respiratory: clear b/l, no added sounds  Abdomen: soft, NT,ND, J-tube in place. Dressing over the laparotomy site clean. Duodenostomy tube plugged. Bowel sounds present  Musculoskeletal: Warm, no edema   Data Reviewed: Basic Metabolic Panel:  Recent Labs Lab 12/11/14 0414 12/12/14 0340 12/13/14 0405  NA 133* 134* 134*  K 3.9 3.6 3.6  CL 111 107 101  CO2 19 21 23   GLUCOSE 153* 155* 169*  BUN 9 8 7   CREATININE 0.59 0.70 0.68  CALCIUM 7.4* 7.4* 7.3*   Liver Function Tests: No results for input(s): AST, ALT, ALKPHOS, BILITOT, PROT, ALBUMIN in the last 168 hours. No results for input(s): LIPASE, AMYLASE in the last 168 hours. No results for input(s): AMMONIA in the last 168 hours. CBC:  Recent Labs Lab 12/12/14 0340 12/13/14 0405 12/14/14 0440 12/15/14 0555 12/17/14 0420  WBC 7.8 6.7 7.5 8.3 7.9  HGB 7.2* 7.0* 7.3* 10.0* 10.0*  HCT 22.3* 22.3* 23.6* 32.3* 32.2*  MCV 89.9 91.0 91.5 90.5 92.3  PLT 153 181 198 229 242   Cardiac Enzymes: No results for input(s): CKTOTAL, CKMB, CKMBINDEX, TROPONINI in the last 168 hours. BNP (last 3 results) No results for input(s): PROBNP in the last 8760 hours. CBG:  Recent Labs Lab 12/16/14 1951 12/17/14 0041 12/17/14 0456 12/17/14 0732 12/17/14 1146  GLUCAP 143* 162* 145* 148* 170*    Recent Results (from the past 240 hour(s))  Urine culture     Status: None   Collection Time: 12/12/14  1:18 AM  Result Value Ref Range Status   Specimen Description URINE, RANDOM  Final   Special Requests NONE  Final   Colony Count   Final    >=100,000 COLONIES/ML Performed at [place]   Final    KLEBSIELLA  OXYTOCA Performed at [facility]    Report Status 12/17/2014 FINAL  Final   Organism ID, Bacteria KLEBSIELLA OXYTOCA  Final      Susceptibility   Klebsiella oxytoca - MIC*    AMPICILLIN >=32 RESISTANT Resistant     CEFAZOLIN >=64 RESISTANT Resistant     CEFTRIAXONE 8 SENSITIVE Sensitive     CIPROFLOXACIN <=0.25 SENSITIVE Sensitive     GENTAMICIN <=1 SENSITIVE Sensitive     LEVOFLOXACIN <=0.12 SENSITIVE Sensitive     NITROFURANTOIN 32 SENSITIVE Sensitive     TOBRAMYCIN <=1 SENSITIVE Sensitive     TRIMETH/SULFA <=20 SENSITIVE Sensitive     PIP/TAZO >=128 RESISTANT Resistant     * KLEBSIELLA OXYTOCA     Studies: No results found.  Scheduled Meds: . bismuth subsalicylate  30 mL Per Tube BID  . chlorhexidine  15 mL Mouth Rinse BID  . ciprofloxacin  500 mg Oral BID  . feeding supplement (VITAL AF 1.2 CAL)  1,000 mL Per Tube Q24H  . insulin aspart  0-15 Units Subcutaneous 6 times per day  . multivitamins with iron  1 tablet Oral  QPC supper  . pantoprazole  40 mg Oral BID  . potassium chloride  40 mEq Per Tube TID  . saccharomyces boulardii  250 mg Oral BID  . sodium chloride  10-40 mL Intracatheter Q12H  . sodium chloride  3 mL Intravenous Q12H  . vancomycin  125 mg Per Tube 4 times per day   Continuous Infusions: . sodium chloride 10 mL/hr at 12/09/14 2055  . sodium chloride 10 mL/hr at 12/05/14 0217      Time spent: 25 minutes    Louellen Molder  [facility] Pager [phone]. If 7PM-7AM, please contact night-coverage at [email or web address], password [facility] 12/17/2014, 1:23 PM  LOS: 18 days

## 2014-12-17 NOTE — Progress Notes (Signed)
Patient ID: Daryl Vega, male   DOB: 08-26-1946, 69 y.o.   MRN: [id number]  General Surgery - [facility] - Progress Note  Subjective: Patient upright in bed, eating regular breakfast.  Denies nausea or emesis.  Voiding, minimal diarrhea.  Objective: Vital signs in last 24 hours: Temp:  [97.8 F (36.6 C)-98.4 F (36.9 C)] 97.8 F (36.6 C) (01/03 0434) Pulse Rate:  [105-110] 110 (01/03 0434) Resp:  [20] 20 (01/03 0434) BP: (94-110)/(56-68) 107/68 mmHg (01/03 0434) SpO2:  [93 %-94 %] 93 % (01/03 0434) Last BM Date: 12/16/14  Intake/Output from previous day: 01/02 0701 - 01/03 0700 In: 300 [P.O.:300] Out: 300 [Urine:300]  Exam: HEENT - clear, not icteric Neck - soft Chest - clear bilaterally Cor - RRR, no murmur Abd - soft, mild distension; dressing intact; minimal tenderness at incision site Ext - no significant edema Neuro - grossly intact, no focal deficits  Lab Results:   Recent Labs  12/15/14 0555 12/17/14 0420  WBC 8.3 7.9  HGB 10.0* 10.0*  HCT 32.3* 32.2*  PLT 229 242    No results for input(s): NA, K, CL, CO2, GLUCOSE, BUN, CREATININE, CALCIUM in the last 72 hours.  Studies/Results: No results found.  Assessment / Plan: 1. Bleeding duodenal ulcer with cholecystoduodenal fistula Ex Lap, oversewing of posterior bleeding DU, pyloroplasty, cholecystectomy, repair of right hepatic artery, repair of cholecystoduodenal fistula and placement of duodenostomy tube, feeding jejunostomy - 11/30/14 - [name] 2. C diff colitis - improving 3. Malnutrition - TF's, regular diet 4. Non small cell lung carcinoma, Stage IIIB (T3, N3, M0) undergoing chemotherapy and radiation therapy. (07/2014) 5. Anemia 6. AODM  Little to add from surgical standpoint at present.  Ongoing wound care.  Discharge plans per medical service and case manager and family.  Will follow.  Earnstine Regal, MD, [facility] Office: [phone]  12/17/2014

## 2014-12-18 LAB — CBC
HCT: 32.3 % — ABNORMAL LOW (ref 39.0–52.0)
Hemoglobin: 9.8 g/dL — ABNORMAL LOW (ref 13.0–17.0)
MCH: 28.2 pg (ref 26.0–34.0)
MCHC: 30.3 g/dL (ref 30.0–36.0)
MCV: 93.1 fL (ref 78.0–100.0)
PLATELETS: 248 10*3/uL (ref 150–400)
RBC: 3.47 MIL/uL — ABNORMAL LOW (ref 4.22–5.81)
RDW: 17.6 % — ABNORMAL HIGH (ref 11.5–15.5)
WBC: 6.6 10*3/uL (ref 4.0–10.5)

## 2014-12-18 LAB — COMPREHENSIVE METABOLIC PANEL
ALT: 24 U/L (ref 0–53)
AST: 22 U/L (ref 0–37)
Albumin: 2.3 g/dL — ABNORMAL LOW (ref 3.5–5.2)
Alkaline Phosphatase: 310 U/L — ABNORMAL HIGH (ref 39–117)
Anion gap: 4 — ABNORMAL LOW (ref 5–15)
BUN: 11 mg/dL (ref 6–23)
CO2: 28 mmol/L (ref 19–32)
Calcium: 8.3 mg/dL — ABNORMAL LOW (ref 8.4–10.5)
Chloride: 104 mEq/L (ref 96–112)
Creatinine, Ser: 0.69 mg/dL (ref 0.50–1.35)
GFR calc Af Amer: >90 mL/min (ref >90)
GFR calc non Af Amer: >90 mL/min (ref >90)
Glucose, Bld: 174 mg/dL — ABNORMAL HIGH (ref 70–99)
Potassium: 4 mmol/L (ref 3.5–5.1)
Sodium: 136 mmol/L (ref 135–145)
Total Bilirubin: 0.5 mg/dL (ref 0.3–1.2)
Total Protein: 6.2 g/dL (ref 6.0–8.3)

## 2014-12-18 LAB — GLUCOSE, CAPILLARY
GLUCOSE-CAPILLARY: 116 mg/dL — AB (ref 70–99)
Glucose-Capillary: 163 mg/dL — ABNORMAL HIGH (ref 70–99)
Glucose-Capillary: 164 mg/dL — ABNORMAL HIGH (ref 70–99)
Glucose-Capillary: 168 mg/dL — ABNORMAL HIGH (ref 70–99)
Glucose-Capillary: 194 mg/dL — ABNORMAL HIGH (ref 70–99)
Glucose-Capillary: 208 mg/dL — ABNORMAL HIGH (ref 70–99)

## 2014-12-18 MED ORDER — BOOST PLUS PO LIQD
237.0000 mL | Freq: Two times a day (BID) | ORAL | Status: DC
  Administered 2014-12-18 – 2014-12-21 (×4): 237 mL via ORAL
  Filled 2014-12-18 (×7): qty 237

## 2014-12-18 MED ORDER — VITAL AF 1.2 CAL PO LIQD
1000.0000 mL | ORAL | Status: DC
  Administered 2014-12-18: 1000 mL
  Filled 2014-12-18 (×2): qty 1000

## 2014-12-18 NOTE — Progress Notes (Signed)
PT Cancellation Note  Patient Details Name: Daryl Vega MRN: [id number] DOB: 1946-05-17   Cancelled Treatment:     PT attempted x 2.  Deferred first attempt 2* pain and 2nd attempt with pt hard to rouse following morphine.  Will follow.   Anjel Perfetti 12/18/2014, 3:14 PM

## 2014-12-18 NOTE — Progress Notes (Addendum)
[facility] PROGRESS NOTE  [name] Tapanes ZOX:[id number] DOB: Jul 26, 1946 DOA: 11/29/2014 PCP: [name], MD  Brief narrative: 69 y/o male admitted on 12/16 with a GI bleed. Underwent endoscopy on 12/17 and was found to have a bleeding duodenal ulcer which rapidly progressed and he was taken to the OR on 12/17 emergently.There, he had an emergency ex laparotomy, oversewing of a posterior bleeding duodenal ulcer, pyloroplasty, cholecystectomy, repair of R hepatic artery, repair of cholecystoduodenal fistula and placement of a duodenostomy tube and feeding J-tube. PCCM consulted for post op ventilator management.  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 Pneumonia 12/21 dark stool, Hgb stable 12/22 no more dark stool, c.diff pcr positive 12/25 - transferred from SDU to tele bed, care transferred to [facility] from [facility]  12/29 - ? UTI based on UA,  12/30: pt having ongoing diarrhea 1/3: Urine culture growing Klebsiella, placed on ciprofloxacin. Diarrhea resolved 1/4 : dced tube feeds, pt c/o nausea. 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 [name] - POD #14 Emergency Ex Lap, oversewing of posterior bleeding DU, Heineke Mikulicz pyloroplasty, cholecystectomy, repair of right hepatic artery, repair of cholecystoduodenal fistula, placement of duodenostomy tube, feeding jejunostomy. 11/30/14, [name]. - management per surgery team  - continue duodenostomy tube clamping with cleaning and dressing  of ostomy  site. -No active bleeding since 12/22, transfused 2 units PRBC on 12/31 for low hemoglobin with improvement -Diet advanced to soft and tolerating well.  nauseous today. TF at lower rate. Can d/c tomorrow if po intake appropriate.    Active problems HTN with persistent tachycardia  Heart rate improved after blood transfusion. Continue when necessary labetalol  C. difficile  colitis Completes 2 weeks course of oral vancomycin today.  changed  feeding to VITAL. Continue florastar. Diarrhea  Resolved.    Anemia and thrombocytopenia Hb in 7s. Improved to 10  after 2U prbc on 12/31.  Continue PPI. thrombocytopenia resolved  Klebsiella UTI Urine culture from 12/29 growing Klebsiella oxytoca sensitive to quinolones. Placed on empiric Rocephin 1/3 for a 7 day course.  Acute respiratory failure with hypoxemia  - atelectasis, resolved   Stage IIIB Squamous Cell Lung cancer - dx 07/2014, Rx carboplatin and AUC 08/2014 - VDRF, extubated (since 12/18), respiratory fn stable  - continue pulm hygiene: IS, and ambulation - O2 as needed - continue symbicort and xopenex PRN    Metabolic acidosis  - with hypokalemia and hypomagnesemia. -Replenished   Protein calorie malnutrition, severe  . Continue nutritional supplement. Diet advanced to soft.     DM2  Continue sliding still insulin  Abdominal pain  when necessary morphine and Percocet   DVT prophylaxis - SCD's   Code Status: Full code Family Communication: none at bedside Disposition Plan: TF dced today. Pt nauseous and c/o abdominal discomfort. Will monitor for adequate intake today. Home possibly tomorrow with HH.   Consultants:  GI  Surgery  Procedures:  Right IJ central line  PICC line  Surgery as outlined above  Antibiotics:  Oral Vancomycin since 12/23--  IV Rocephin since 12/29  HPI/Subjective: Patient seen  and examined. Diarrhea resolved. Still has off and on abdominal  pain.   Objective: Filed Vitals:   12/18/14 0407  BP: 95/60  Pulse: 109  Temp: 98.7 F (37.1 C)  Resp: 20    Intake/Output Summary (Last 24 hours) at 12/18/14 1409 Last data filed at 12/18/14 1100  Gross per 24 hour  Intake   1200 ml  Output    500 ml  Net    700 ml   Filed Weights   12/04/14 0400 12/05/14 0400 12/07/14 0423  Weight: 72.7 kg (160 lb 4.4 oz) 71.4 kg (157 lb 6.5 oz) 71.3 kg (157 lb 3 oz)    Exam:   General:  Elderly thin built male in NAD, appears fatigued.   HEENT:  moist mucosa  Cardiovascular: NS1&S2, no murmurs  Respiratory: clear b/l, no added sounds  Abdomen: soft, ,ND, J-tube in place. Tender, Dressing over the laparotomy site clean. Duodenostomy tube plugged. Bowel sounds present.  Musculoskeletal: Warm, no edema   Data Reviewed: Basic Metabolic Panel:  Recent Labs Lab 12/12/14 0340 12/13/14 0405  NA 134* 134*  K 3.6 3.6  CL 107 101  CO2 21 23  GLUCOSE 155* 169*  BUN 8 7  CREATININE 0.70 0.68  CALCIUM 7.4* 7.3*   Liver Function Tests: No results for input(s): AST, ALT, ALKPHOS, BILITOT, PROT, ALBUMIN in the last 168 hours. No results for input(s): LIPASE, AMYLASE in the last 168 hours. No results for input(s): AMMONIA in the last 168 hours. CBC:  Recent Labs Lab 12/12/14 0340 12/13/14 0405 12/14/14 0440 12/15/14 0555 12/17/14 0420  WBC 7.8 6.7 7.5 8.3 7.9  HGB 7.2* 7.0* 7.3* 10.0* 10.0*  HCT 22.3* 22.3* 23.6* 32.3* 32.2*  MCV 89.9 91.0 91.5 90.5 92.3  PLT 153 181 198 229 242   Cardiac Enzymes: No results for input(s): CKTOTAL, CKMB, CKMBINDEX, TROPONINI in the last 168 hours. BNP (last 3 results) No results for input(s): PROBNP in the last 8760 hours. CBG:  Recent Labs Lab 12/17/14 2001 12/17/14 2359 12/18/14 0405 12/18/14 0728 12/18/14 1144  GLUCAP 145* 208* 116* 163* 194*    Recent Results (from the past 240 hour(s))  Urine culture     Status: None   Collection Time: 12/12/14  1:18 AM  Result Value  Ref Range Status   Specimen Description URINE, RANDOM  Final   Special Requests NONE  Final   Colony Count   Final    >=100,000 COLONIES/ML Performed at [place]   Final    KLEBSIELLA OXYTOCA Performed at [facility]    Report Status 12/17/2014 FINAL  Final   Organism ID, Bacteria KLEBSIELLA OXYTOCA  Final      Susceptibility   Klebsiella oxytoca - MIC*    AMPICILLIN >=32 RESISTANT Resistant     CEFAZOLIN >=64 RESISTANT Resistant     CEFTRIAXONE 8 SENSITIVE Sensitive     CIPROFLOXACIN <=0.25 SENSITIVE Sensitive     GENTAMICIN <=1 SENSITIVE Sensitive     LEVOFLOXACIN <=0.12 SENSITIVE Sensitive     NITROFURANTOIN 32 SENSITIVE Sensitive     TOBRAMYCIN <=1 SENSITIVE Sensitive     TRIMETH/SULFA <=20 SENSITIVE Sensitive     PIP/TAZO >=128 RESISTANT Resistant     * KLEBSIELLA OXYTOCA     Studies: No results found.  Scheduled Meds: . bismuth subsalicylate  30 mL Per Tube BID  . chlorhexidine  15 mL Mouth Rinse BID  . ciprofloxacin  500 mg Oral BID  . feeding supplement (  VITAL AF 1.2 CAL)  1,000 mL Per Tube Q24H  . insulin aspart  0-15 Units Subcutaneous 6 times per day  . lactose free nutrition  237 mL Oral BID BM  . multivitamins with iron  1 tablet Oral QPC supper  . pantoprazole  40 mg Oral BID  . potassium chloride  40 mEq Per Tube TID  . saccharomyces boulardii  250 mg Oral BID  . sodium chloride  10-40 mL Intracatheter Q12H  . sodium chloride  3 mL Intravenous Q12H  . vancomycin  125 mg Per Tube 4 times per day   Continuous Infusions: . sodium chloride 10 mL/hr at 12/09/14 2055  . sodium chloride 10 mL/hr at 12/17/14 2111      Time spent: 25 minutes    Louellen Molder  [facility] Pager [phone]. If 7PM-7AM, please contact night-coverage at [email or web address], password [facility] 12/18/2014, 2:09 PM  LOS: 19 days

## 2014-12-18 NOTE — Progress Notes (Addendum)
NUTRITION FOLLOW UP/CONSULT  Intervention:   ADDENDUM: -Wean Vital AF 1.2 to 20 ml/hr (576 kcal, 36 gram protein), with plan to discontinue tube feeding on 1/05 as RN reported pt developing nausea and currently unable to consume supplements -Recommend Boost Plus BID, each supplement provides 360 kcal, 14 gram protein -Encouraged use of mouth rinses and oral care to assist with taste changes -Consider use of appetite stimulant or anti-emetic to assist with improving PO intake -RD to continue to monitor  Nutrition Dx:   Inadequate oral intake related to nausea/taste changes as evidenced by PO intake < 75%, 50 lb weight loss; ongoing, now r/t to taste changes  Goal:   PO intake to meet >/= 90% of their estimated nutrition needs; progressing   Monitor:   Total protein/energy intake, labs, weight, GI profile  Assessment:   69 y.o. male with a PMHx of DM2, HLD, melanoma, NSCLC currently undergoing radiation/chemotherapy, who presents to the ED with complaints of lightheadedness , hematemesis, melena, syncopal episode with standing which has been ongoing for ~1week and caused him to fall back into the bathroom wall this morning around 3:30 am.  12/17: -Pt reported an unintentional wt loss of ~50 lbs since beginning of chemo and radiation treatments, approximately 8 months ago (25% body weight loss, severe for time frame) -Endorsed ongoing nausea and taste changes that have inhibited appetite. Pt consumes one Boost daily, and minimal intake of meals or snacks. Enjoys sweets and snacks vs meals -RD encouraged use of anti-emetics, as well as implementing use of oral rinses to assist with taste changes -Has been evaluated by outpatient oncology RD in 08/2014 for persistent nausea and vomiting. Was educated and provided nutrition handouts/resources. Has experienced ~10 lb weight loss since outpatient appointment. -Pt NPO for EGD d/t possible GI bleed. -RD to order Boost once daily + snacks w/diet  advancement. Pt willing to consume MagicCup and pudding inbetween meals  12/18: -Per MD note, pt with bleeding duodenal bulb ulcer and fistula; duodenostomy tube and Jtube was placed -Glucerna 1.2 at 20 ml/hr infusing at 20 ml/hr per surgery recommendations. Providing 576 kcal, 15 gram protein, 386 ml free water -CBG elevated -Day 1 post op.Patient is currently intubated on ventilator support MV: 10.8 L/min Temp (24hrs), Avg:98.4 F (36.9 C), Min:98 F (36.7 C), Max:98.7 F (37.1 C)  12/21: - Pt extubated 12/19. Tolerating TF at 40 ml/hr. TF advanced this am per surgery. Spoke with RN. Residuals unable to be checked due to collapse of tube upon attempt. Coffee ground material draining from NG tube, does not look like TF. Bowel movements are loose and black.   12/23: - Pt tolerating TF at goal rate.  - Pt having diarrhea due to C.Diff - Surgery is allowing pt to have ice chips, flavor over ice, and popsicle which pt is tolerating. He said that he had some yogurt? this morning which he liked and tolerated. He was asking when he will be able to have his diet upgraded.  Will continue to monitor surgery notes.   Labs: Na WNL K low BUN WNL Mg Low  12/30: -Pt tolerating Glucerna 1.2 at goal rate of 65 ml/hr Jtube with minimal residual -Received consult to modify tube feeding regimen d/t ongoing loose stools. Per discussion with RN, pt had been experiencing 10-12 loose stools daily; however this was improving and patient had only experienced 3 episodes this morning (~7am-11pm) -Pt with C.diff, and receiving antibiotics; both of which likely contributing to loose stools -Diet advanced to Clear  liquids, with plan to advance to full liquids tomorrow as tolerated, and eventual wean of TF -RN reported pt not candidate for anti-diarrheals d/t C.diff -Discussed concern with MD; who was in agreement to look into possible anti-diarrheals as warranted  1/04: -Vital AF 1.2 infusing at 40 ml/hr,  providing approximately 1152 kcal, 72 gram protein -Diet advanced to Soft on 1/02. Pt reported consuming 50% of meals; and experiencing loss of taste that contribute to sub-optimal intake -Reviewed nutrition therapy to assist with taste changes (mouth rinses, use of lemon drops or mints) however pt currently refusing oral care per wife as it induces nausea -Consider use of antiemetic or appetite stimulant to assist with PO intake -Pt drinks Boost once daily at home, will discontinue tube feeding and encourage oral care and Boost supplement BID  Height: Ht Readings from Last 1 Encounters:  11/29/14 5\' 8"  (1.727 m)    Weight Status:   Wt Readings from Last 1 Encounters:  12/07/14 157 lb 3 oz (71.3 kg)    Re-estimated needs:  Kcal: 1800-2000 Protein: 85-100 gram Fluid: >/=2000 ml daily  Skin: closed incision on abdomen  Diet Order: DIET SOFT   Intake/Output Summary (Last 24 hours) at 12/18/14 1236 Last data filed at 12/18/14 1100  Gross per 24 hour  Intake   1320 ml  Output    500 ml  Net    820 ml    Last BM: 1/04   Labs:   Recent Labs Lab 12/12/14 0340 12/13/14 0405  NA 134* 134*  K 3.6 3.6  CL 107 101  CO2 21 23  BUN 8 7  CREATININE 0.70 0.68  CALCIUM 7.4* 7.3*  GLUCOSE 155* 169*    CBG (last 3)   Recent Labs  12/18/14 0405 12/18/14 0728 12/18/14 1144  GLUCAP 116* 163* 194*    Scheduled Meds: . bismuth subsalicylate  30 mL Per Tube BID  . chlorhexidine  15 mL Mouth Rinse BID  . ciprofloxacin  500 mg Oral BID  . feeding supplement (VITAL AF 1.2 CAL)  1,000 mL Per Tube Q24H  . insulin aspart  0-15 Units Subcutaneous 6 times per day  . multivitamins with iron  1 tablet Oral QPC supper  . pantoprazole  40 mg Oral BID  . potassium chloride  40 mEq Per Tube TID  . saccharomyces boulardii  250 mg Oral BID  . sodium chloride  10-40 mL Intracatheter Q12H  . sodium chloride  3 mL Intravenous Q12H  . vancomycin  125 mg Per Tube 4 times per day     Continuous Infusions: . sodium chloride 10 mL/hr at 12/09/14 2055  . sodium chloride 10 mL/hr at 12/17/14 2111    Atlee Abide [place] Clinical Dietitian CNOBS:[phone]

## 2014-12-18 NOTE — Progress Notes (Signed)
18 Days Post-Op  Subjective: He still looks weak and worn out.  Abdominal wound opened more.  He has more areas of purulence.  Staples all removed also so we will continue wet to dry dressings here. Recheck labs.  We need to decide on how much longer we keep him on TF and oral vancomycin.    Objective: Vital signs in last 24 hours: Temp:  [98 F (36.7 C)-98.7 F (37.1 C)] 98.5 F (36.9 C) (01/04 1442) Pulse Rate:  [104-112] 107 (01/04 1442) Resp:  [18-20] 18 (01/04 1442) BP: (95-106)/(60-67) 97/62 mmHg (01/04 1442) SpO2:  [92 %-95 %] 93 % (01/04 1442) Last BM Date: 12/18/14 Afebrile,VSS 1 stool recorded yesterday.  2 stools today recorded. Day 13 of oral vancomycin Afebrile, VSS, he remains tachycardic H/h up to 10 after transfusion last week Intake/Output from previous day: 01/03 0701 - 01/04 0700 In: 1320 [P.O.:360; I.V.:480; NG/GT:480] Out: 800 [Urine:800] Intake/Output this shift: Total I/O In: 240 [P.O.:240] Out: -   General appearance: alert, cooperative and no distress Resp: clear to auscultation bilaterally and anterior GI: soft, his color is better.  Purulent drainage from midline incision.  i took out all the staples and opened it more.  Tolerating TF and PO's.   Lab Results:   Recent Labs  12/17/14 0420  WBC 7.9  HGB 10.0*  HCT 32.2*  PLT 242    BMET No results for input(s): NA, K, CL, CO2, GLUCOSE, BUN, CREATININE, CALCIUM in the last 72 hours. PT/INR No results for input(s): LABPROT, INR in the last 72 hours.  No results for input(s): AST, ALT, ALKPHOS, BILITOT, PROT, ALBUMIN in the last 168 hours.   Lipase     Component Value Date/Time   LIPASE 41 11/29/2014 0644     Studies/Results: No results found.  Medications: . bismuth subsalicylate  30 mL Per Tube BID  . chlorhexidine  15 mL Mouth Rinse BID  . ciprofloxacin  500 mg Oral BID  . feeding supplement (VITAL AF 1.2 CAL)  1,000 mL Per Tube Q24H  . insulin aspart  0-15 Units Subcutaneous  6 times per day  . lactose free nutrition  237 mL Oral BID BM  . multivitamins with iron  1 tablet Oral QPC supper  . pantoprazole  40 mg Oral BID  . potassium chloride  40 mEq Per Tube TID  . saccharomyces boulardii  250 mg Oral BID  . sodium chloride  10-40 mL Intracatheter Q12H  . sodium chloride  3 mL Intravenous Q12H  . vancomycin  125 mg Per Tube 4 times per day   . sodium chloride 10 mL/hr at 12/09/14 2055  . sodium chloride 10 mL/hr at 12/17/14 2111    Assessment/Plan 1. 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 Ex Lap, oversewing of posterior bleeding DU, Heineke Mikulicz pyloroplasty, cholecystectomy, repair of right hepatic artery, repair of cholecystoduodenal fistula and placement of duodenostomy tube, feeding jejunostomy - 11/30/14 - Dr. Jackolyn Confer 2. C diff colitis - He has had 5 days of vancomycin per Feeding tube 3. Malnutrition on tube feeding thru J tube Prealbumin 14.3 on 12/12/14 4. Non small cell lung carcinoma, Stage IIIB (T3, N3, M0) undergoing chemotherapy and radiation therapy. (07/2014) 5. Anemia 6. AODM 7. Hypolakemia, Resolved. 8. Probable UTI  KLEBSIELLA OXYTOCA >100K    Plan:  Wet to dry dressings BID, check labs, he needs to be mobilized.  ASK OT and PT to see again.  Tomorrow will be 14 days on oral vancomycin. On day 2 of treatment for UTI.   LOS: 19 days    Wendolyn Raso 12/18/2014

## 2014-12-19 ENCOUNTER — Ambulatory Visit: Payer: BC Managed Care – PPO

## 2014-12-19 DIAGNOSIS — A0472 Enterocolitis due to Clostridium difficile, not specified as recurrent: Secondary | ICD-10-CM | POA: Diagnosis not present

## 2014-12-19 LAB — GLUCOSE, CAPILLARY
GLUCOSE-CAPILLARY: 144 mg/dL — AB (ref 70–99)
GLUCOSE-CAPILLARY: 192 mg/dL — AB (ref 70–99)
Glucose-Capillary: 123 mg/dL — ABNORMAL HIGH (ref 70–99)
Glucose-Capillary: 139 mg/dL — ABNORMAL HIGH (ref 70–99)
Glucose-Capillary: 155 mg/dL — ABNORMAL HIGH (ref 70–99)
Glucose-Capillary: 156 mg/dL — ABNORMAL HIGH (ref 70–99)

## 2014-12-19 LAB — PREALBUMIN: Prealbumin: 18.8 mg/dL (ref 17.0–34.0)

## 2014-12-19 MED ORDER — PANTOPRAZOLE SODIUM 40 MG PO TBEC: 40.0000 mg | DELAYED_RELEASE_TABLET | Freq: Two times a day (BID) | ORAL | Status: DC

## 2014-12-19 MED ORDER — BOOST PLUS PO LIQD: 237.0000 mL | Freq: Two times a day (BID) | ORAL | Status: DC

## 2014-12-19 MED ORDER — TAB-A-VITE/IRON PO TABS: 1.0000 | ORAL_TABLET | Freq: Every day | ORAL | Status: DC

## 2014-12-19 MED ORDER — LORAZEPAM 0.5 MG PO TABS: ORAL_TABLET | ORAL | Status: DC

## 2014-12-19 MED ORDER — BISMUTH SUBSALICYLATE 262 MG/15ML PO SUSP: 30.0000 mL | Freq: Two times a day (BID) | ORAL | Status: DC

## 2014-12-19 MED ORDER — OXYCODONE HCL 5 MG PO TABS: 10.0000 mg | ORAL_TABLET | ORAL | Status: DC | PRN

## 2014-12-19 MED ORDER — MORPHINE SULFATE 2 MG/ML IJ SOLN
1.0000 mg | INTRAMUSCULAR | Status: DC | PRN
  Administered 2014-12-19 – 2014-12-21 (×6): 1 mg via INTRAVENOUS
  Filled 2014-12-19 (×6): qty 1

## 2014-12-19 MED ORDER — OXYCODONE HCL 5 MG PO TABS
5.0000 mg | ORAL_TABLET | ORAL | Status: DC | PRN
Start: 2014-12-19 — End: 2014-12-21
  Administered 2014-12-19: 15 mg via ORAL
  Administered 2014-12-19: 10 mg via ORAL
  Administered 2014-12-19 – 2014-12-21 (×6): 15 mg via ORAL
  Filled 2014-12-19 (×3): qty 3
  Filled 2014-12-19: qty 2
  Filled 2014-12-19 (×4): qty 3

## 2014-12-19 MED ORDER — CIPROFLOXACIN HCL 500 MG PO TABS: 500.0000 mg | ORAL_TABLET | Freq: Two times a day (BID) | ORAL | Status: AC

## 2014-12-19 MED ORDER — POTASSIUM CHLORIDE CRYS ER 20 MEQ PO TBCR: 40.0000 meq | EXTENDED_RELEASE_TABLET | Freq: Every day | ORAL | Status: DC

## 2014-12-19 MED ORDER — PROCHLORPERAZINE MALEATE 10 MG PO TABS: 10.0000 mg | ORAL_TABLET | Freq: Four times a day (QID) | ORAL | Status: DC | PRN

## 2014-12-19 MED ORDER — VITAL AF 1.2 CAL PO LIQD
1000.0000 mL | ORAL | Status: DC
  Administered 2014-12-19 – 2014-12-20 (×2): 1000 mL
  Filled 2014-12-19 (×2): qty 1000

## 2014-12-19 MED ORDER — HYDROCORTISONE ACE-PRAMOXINE 2.5-1 % RE CREA: 1.0000 "application " | TOPICAL_CREAM | Freq: Four times a day (QID) | RECTAL | Status: DC | PRN

## 2014-12-19 MED ORDER — SACCHAROMYCES BOULARDII 250 MG PO CAPS: 250.0000 mg | ORAL_CAPSULE | Freq: Two times a day (BID) | ORAL | Status: DC

## 2014-12-19 MED ORDER — ACETAMINOPHEN 325 MG PO TABS: 650.0000 mg | ORAL_TABLET | Freq: Four times a day (QID) | ORAL | Status: DC | PRN

## 2014-12-19 NOTE — Discharge Summary (Signed)
Physician Discharge Summary  Daryl Vega [facility] KGM:[id number] DOB: 11-Oct-1946 DOA: 11/29/2014  PCP: [name], MD  Admit date: 11/29/2014 Discharge date: 12/19/2014  Time spent: 35 minutes  Recommendations for Outpatient Follow-up:  1. discharge home with [facility] and PT 2. 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 Underwent endoscopy on 12/17 and was found to have a bleeding duodenal ulcer which rapidly progressed and he was taken to the OR on 12/17 emergently.There, he had an emergency ex laparotomy, oversewing of a posterior bleeding duodenal ulcer, pyloroplasty, cholecystectomy, repair of R hepatic artery, repair of cholecystoduodenal fistula and placement of a duodenostomy tube and feeding J-tube. PCCM consulted for post op ventilator management.  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  Pneumonia 12/21 dark stool, Hgb stable 12/22 no more dark stool, c.diff pcr positive 12/25 - transferred from SDU to tele bed, care transferred to [facility] from [facility]  12/29 - ? UTI based on UA,  12/30: pt having ongoing diarrhea 1/3: Urine culture growing Klebsiella, placed on ciprofloxacin. Diarrhea resolved 1/4 : dced tube feeds, pt c/o nausea. Feeds resumed at lower rate 1/5 : tube feeds transitioned to nocturnal     Hospital Course:  Principle problem Acute GI Bleeding - bleeding duodenal ulcer, cholecystoduodenal fistula due to DU  - EGD for control of bleeding 11/30/16 by Dr. [name] - POD #15 Emergency Ex Lap, oversewing of posterior bleeding DU, Heineke Mikulicz pyloroplasty, cholecystectomy, repair of right hepatic artery, repair of cholecystoduodenal fistula, placement of duodenostomy tube, feeding jejunostomy. 11/30/14, [name]. - appreciate surgery follow up - duodenostomy tube clamped with cleaning and dressing of ostomy site. -No active bleeding since 12/22, transfused 2 units PRBC on 12/31 for low hemoglobin with improvement -Diet advanced to soft and tolerating well. hospital course prolonged due to on going need for tube feeds. TF switched to nighttime only on 1/5. Po intake has improved. Added nutritional supplements -prescribed prn percocet and phenergan.    Active problems HTN with persistent tachycardia  Heart rate improved after blood transfusion.   C. difficile colitis Completeed 2 weeks course of oral vancomycin on 1/4. changed feeding to VITAL. Continue florastar. Diarrhea Resolved.    Anemia and thrombocytopenia Hb  Improved to 10 after 2U prbc on 12/31. Continue PPI. thrombocytopenia resolved  Klebsiella UTI Urine culture from 12/29 growing Klebsiella oxytoca sensitive to quinolones. Treating for  a 7 day course, completes on 1/10  Acute respiratory failure with hypoxemia  - atelectasis on CXR, resolved   Stage IIIB Squamous Cell  Lung cancer - dx 07/2014, Rx carboplatin and AUC 08/2014 - VDRF, extubated (since 12/18), respiratory fn stable  - continue pulm hygiene: IS, and ambulation - O2 as needed - continue symbicort and xopenex PRN    Metabolic acidosis  - with hypokalemia and hypomagnesemia. -Replenished   Protein calorie malnutrition, severe  . Continue nutritional supplement. Tolerating soft diet. Plan to discontinuing tube feeds prior to discharge.    DM2  Resume home meds. A1C of 7.3  Abdominal pain when necessary Percocet     Code Status: Full code Family Communication:  D/w wife Disposition Plan: home with [facility]   Consultants:  GI  Surgery  Procedures:  Right IJ central line  PICC line  Surgery as outlined above  Antibiotics:  Oral Vancomycin since 12/23--  IV Rocephin since 12/29  Discharge Exam: Filed Vitals:   12/19/14 1457  BP: 105/64  Pulse: 110  Temp: 97.8 F (36.6 C)  Resp: 18     General: Elderly thin built male in NAD, appears fatigued.   HEENT: pallor +, moist mucosa  Cardiovascular: NS1&S2, no murmurs  Respiratory: clear b/l, no added sounds  Abdomen: soft, ,ND, J-tube in place. Tender, Dressing over the laparotomy site clean. Duodenostomy tube plugged. Bowel sounds present.  Musculoskeletal: Warm, no edema  CNS: alert and oreinted  Discharge Instructions    Current Discharge Medication List    START taking these medications   Details  bismuth subsalicylate (PEPTO BISMOL) 262 MG/15ML suspension Place 30 mLs into feeding tube 2 (two) times daily. Qty: 360 mL, Refills: 0    ciprofloxacin (CIPRO) 500 MG tablet Take 1 tablet (500 mg total) by mouth 2 (two) times daily. Qty: 10 tablet, Refills: 0    hydrocortisone-pramoxine (ANALPRAM-HC) 2.5-1 % rectal cream Place 1 application rectally every 6 (six) hours as needed for hemorrhoids or itching. Qty: 30 g, Refills: 0    lactose free nutrition (BOOST PLUS) LIQD Take 237 mLs by mouth  2 (two) times daily between meals. Qty: 60 Can, Refills: 0    Multiple Vitamins-Iron (MULTIVITAMINS WITH IRON) TABS tablet Take 1 tablet by mouth daily after supper. Qty: 30 tablet, Refills: 0    oxyCODONE (OXY IR/ROXICODONE) 5 MG immediate release tablet Take 2 tablets (10 mg total) by mouth every 4 (four) hours as needed for moderate pain. Qty: 40 tablet, Refills: 0    pantoprazole (PROTONIX) 40 MG tablet Take 1 tablet (40 mg total) by mouth 2 (two) times daily. Qty: 60 tablet, Refills: 0    saccharomyces boulardii (FLORASTOR) 250 MG capsule Take 1 capsule (250 mg total) by mouth 2 (two) times daily. Qty: 60 capsule, Refills: 0        CONTINUE these medications which have CHANGED   Details  LORazepam (ATIVAN) 0.5 MG tablet Take 1 table by mouth every 12 hours as needed for anxiety Qty: 30 tablet, Refills: 0    !! potassium chloride SA (K-DUR,KLOR-CON) 20 MEQ tablet Take 2 tablets (40 mEq total) by mouth daily. Qty: 30 tablet, Refills: 0   Associated Diagnoses: Hypokalemia    prochlorperazine (COMPAZINE) 10 MG tablet Take 1 tablet (10 mg total) by mouth every 6 (six) hours as needed for nausea or vomiting. Qty: 30 tablet, Refills: 0   Associated Diagnoses: Malignant neoplasm of lung, unspecified laterality, unspecified part of lung  CONTINUE these medications which have NOT CHANGED   Details  atorvastatin (LIPITOR) 80 MG tablet Take 80 mg by mouth daily.    budesonide-formoterol (SYMBICORT) 160-4.5 MCG/ACT inhaler Take 2 puffs first thing in am and then another 2 puffs about 12 hours later. Qty: 1 Inhaler, Refills: 3    Cholecalciferol (VITAMIN D3 PO) Take 10,000 Units by mouth daily.    Cinnamon 500 MG capsule Take 500 mg by mouth daily.    Dapagliflozin Propanediol (FARXIGA) 5 MG TABS Take 5 mg by mouth 2 (two) times daily.    glimepiride (AMARYL) 4 MG tablet Take 4 mg by mouth 2 (two) times daily. Take before meals    metFORMIN (GLUCOPHAGE) 500 MG tablet Take  500 mg by mouth 2 (two) times daily with a meal.    temazepam (RESTORIL) 15 MG capsule Take 1 capsule (15 mg total) by mouth at bedtime as needed for sleep. Qty: 20 capsule, Refills: 0             STOP taking these medications     HYDROcodone-acetaminophen (NORCO) 5-325 MG per tablet      methylPREDNIsolone (MEDROL DOSPACK) 4 MG tablet      omeprazole (PRILOSEC) 20 MG capsule      PRESCRIPTION MEDICATION      potassium chloride (K-DUR) 10 MEQ tablet        No Known Allergies Follow-up Information    Follow up with SPEAR, TAMMY, MD In 1 week.   Specialty:  Family Medicine   Contact information:   [address] [place] [address] [phone]       Follow up with [name], MD. Schedule an appointment as soon as possible for a visit in 4 weeks.   Specialty:  Gastroenterology   Contact information:   [address] [place] [address] [phone]       Follow up with [facility] In 2 weeks.       The results of significant diagnostics from this hospitalization (including imaging, microbiology, ancillary and laboratory) are listed below for reference.    Significant Diagnostic Studies: Ct Abdomen Pelvis W Contrast  12/11/2014   CLINICAL DATA:  69 year old male with history of a bleeding duodenal ulcer with cholecystoduodenal fistula status post surgical repair. Additional history of non-small-cell lung cancer undergoing chemotherapy and radiation therapy (August 2015).  EXAM: CT ABDOMEN AND PELVIS WITH CONTRAST  TECHNIQUE: Multidetector CT imaging of the abdomen and pelvis was performed using the standard protocol following bolus administration of intravenous contrast.  CONTRAST:  25 mL OMNIPAQUE IOHEXOL 300 MG/ML SOLN, 166mL OMNIPAQUE IOHEXOL 300 MG/ML SOLN  COMPARISON:  PET-CT 07/26/2014.  FINDINGS: Lower chest: Small bilateral pleural effusions (right greater than left), layering dependently. Small amount of  pericardial fluid and/or thickening, unlikely to be of hemodynamic significance at this time, but new compared to the prior examination.  Hepatobiliary: No cystic or solid hepatic lesions. No intra or extrahepatic biliary ductal dilatation. Status post cholecystectomy. Small linear appearing area of low attenuation in the inferior aspect of segment 3 of the liver (image 22 of series 2) may represent a small contusion. Beneath the cholecystectomy clips there is a 4.2 x 6.3 x 2.3 cm postoperative fluid collection which tracks inferiorly along the posterior and lateral surface of the duodenum, best appreciated on image 30 of series 2, and sagittal image 33.  Pancreas: Unremarkable. Specifically, no pancreatic ductal dilatation.  Spleen: Unremarkable.  Adrenals/Urinary Tract: Several sub cm low-attenuation  lesions are noted in the kidneys bilaterally, too small to characterize, but statistically likely a tiny cysts. In addition, there is a 1.4 cm simple cyst in the upper pole of the right kidney. 1.6 cm right adrenal nodule is unchanged, previously characterized as an adenoma. Left adrenal gland is normal in appearance. No hydroureteronephrosis. Urinary bladder is remarkable for extensive gas in the wall of the urinary bladder.  Stomach/Bowel: Normal appearance of the stomach. Duodenostomy tube in position with tip terminating in the second portion of the duodenum. There is a small amount of extraluminal gas adjacent to the duodenostomy catheter immediately anterior to the duodenal bulb (image 26 of series 2 and sagittal image 42). Notably, although there is oral contrast material in the stomach and duodenum, there is no leakage of oral contrast material outside the duodenum. No larger volume of pneumoperitoneum is otherwise noted. Percutaneous jejunostomy tube noted. No pathologic dilatation of small bowel or colon. Diffuse thickening of the colonic and rectal walls, concerning for colitis. Inflammatory changes in the  overlying mesocolon. 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  Vascular/Lymphatic: Moderate atherosclerosis throughout the abdominal and pelvic vasculature, without evidence of aneurysm or dissection.  Reproductive: Prostate gland and seminal vesicles are unremarkable in appearance.  Other: Small volume of ascites, most evident adjacent to the liver and spleen.  Musculoskeletal: Midline surgical clips in the anterior abdominal wall. There are no aggressive appearing lytic or blastic lesions noted in the visualized portions of the skeleton.  IMPRESSION: 1. Postoperative changes of cholecystectomy with the duodenostomy tube in position. There is a small amount of extraluminal gas adjacent to the duodenostomy tube, but no large volume of pneumoperitoneum, and no evidence of leakage oral contrast material from the duodenum at this time. In addition, there is a small amount of postoperative fluid tracking inferiorly from the gallbladder fossa immediately posterior and lateral to the second portion of the duodenum. This may simply represent a postoperative seroma, although the possibility of a biloma is not excluded. 2. Diffuse thickening of the wall of the colon and rectum, concerning for a colitis such as C difficile colitis. 3. Gas in the wall of the urinary bladder. The wall of the urinary bladder otherwise does not appear inflamed. This may simply be iatrogenic related to recent Foley catheter placement, however, urinalysis and clinical correlation is recommended to exclude signs or symptoms of emphysematous cystitis. 4. Small bilateral pleural effusions (right greater than left), and small pericardial effusion and/or thickening, new compared to prior PET-CT 07/26/2014. 5. Additional incidental findings, as above. These results were called by telephone at the time of interpretation on 12/11/2014 at 11:10 am to Dr. Alphonsa Overall, who verbally acknowledged these results.    Electronically Signed   By: Vinnie Langton M.D.   On: 12/11/2014 11:13   Dg Chest Port 1 View  12/05/2014   CLINICAL DATA:  Lung atelectatic change  EXAM: PORTABLE CHEST - 1 VIEW  COMPARISON:  December 04, 2014  FINDINGS: Tube and catheter positions are unchanged without pneumothorax. There is patchy infiltrate with volume loss right upper lobe. There is also patchy infiltrate left lower lobe. No new opacity. Heart is normal in size and contour. Pulmonary vascular is normal. No adenopathy.  IMPRESSION: Right upper lobe and left lower lobe infiltrate. Volume loss right upper lobe, stable. No change in cardiac silhouette. Tube and catheter positions unchanged. No pneumothorax.   Electronically Signed   By: Lowella Grip M.D.   On: 12/05/2014  07:16   Dg Chest Port 1 View  12/04/2014   CLINICAL DATA:  Central catheter placement  EXAM: PORTABLE CHEST - 1 VIEW  COMPARISON:  December 03, 2014  FINDINGS: Central catheter tip is in the right atrium, slightly beyond the cavoatrial junction. Nasogastric tube tip and side port are below the diaphragm. Cordis has been removed from the right side. No pneumothorax. There is infiltrate and volume loss the right upper lobe, stable. There is focal infiltrate in the left lower lobe is well. Heart size and pulmonary vascularity are normal. No adenopathy.  IMPRESSION: Tube and catheter positions as described without pneumothorax. Volume loss with patchy infiltrate right upper lobe. Focal area of infiltrate left base as well.   Electronically Signed   By: Lowella Grip M.D.   On: 12/04/2014 19:01   Dg Chest Port 1 View  12/03/2014   CLINICAL DATA:  Acute onset of respiratory distress. Personal history of smoking. Initial encounter.  EXAM: PORTABLE CHEST - 1 VIEW  COMPARISON:  Chest radiograph performed 11/30/2014  FINDINGS: An enteric tube is seen extending below the diaphragm. A right IJ line is noted ending overlying the proximal to mid SVC.  The lungs are  well-aerated. Right apical pleural parenchymal scarring is again noted. Increased interstitial markings are new from November, with mild right perihilar and left basilar airspace opacities. This could reflect mild pneumonia, given the patient's symptoms. There is no evidence of pleural effusion or pneumothorax.  The cardiomediastinal silhouette is within normal limits. No acute osseous abnormalities are seen.  IMPRESSION: Mild right perihilar and left basilar airspace opacities could reflect mild pneumonia, superimposed on the patient's chronic lung changes.   Electronically Signed   By: Garald Balding M.D.   On: 12/03/2014 02:58   Portable Chest Xray  11/30/2014   CLINICAL DATA:  Acute respiratory failure and hypoxia following abdominal surgery today.  EXAM: PORTABLE CHEST - 1 VIEW  COMPARISON:  10/28/2014  FINDINGS: Endotracheal tube tip 2.4 cm above the carina. Right IJ introducer sheath tip: Is SVC. Nasogastric tube enters the stomach.  And in addition to the bandlike presumed scarring in the right upper lobe shown on the prior exam, There is abnormal interstitial accentuation bilaterally is and some subsegmental atelectasis at the left lung base. No overt airspace edema. Heart size within normal limits. Degenerative right glenohumeral arthropathy. No pneumothorax.  IMPRESSION: 1. Increased bilateral interstitial accentuation compared to the prior exam, possibly from noncardiogenic edema. No overt cardiomegaly. 2. Subsegmental atelectasis at the left lung base. 3. Continued scarring in the right upper lobe. 4. Tubes and lines appear satisfactorily positioned.   Electronically Signed   By: Sherryl Barters M.D.   On: 11/30/2014 18:05    Microbiology: Recent Results (from the past 240 hour(s))  Urine culture     Status: None   Collection Time: 12/12/14  1:18 AM  Result Value Ref Range Status   Specimen Description URINE, RANDOM  Final   Special Requests NONE  Final   Colony Count   Final     >=100,000 COLONIES/ML Performed at [place]   Final    KLEBSIELLA OXYTOCA Performed at [facility]    Report Status 12/17/2014 FINAL  Final   Organism ID, Bacteria KLEBSIELLA OXYTOCA  Final      Susceptibility   Klebsiella oxytoca - MIC*    AMPICILLIN >=32 RESISTANT Resistant     CEFAZOLIN >=64 RESISTANT Resistant     CEFTRIAXONE 8 SENSITIVE Sensitive  CIPROFLOXACIN <=0.25 SENSITIVE Sensitive     GENTAMICIN <=1 SENSITIVE Sensitive     LEVOFLOXACIN <=0.12 SENSITIVE Sensitive     NITROFURANTOIN 32 SENSITIVE Sensitive     TOBRAMYCIN <=1 SENSITIVE Sensitive     TRIMETH/SULFA <=20 SENSITIVE Sensitive     PIP/TAZO >=128 RESISTANT Resistant     * KLEBSIELLA OXYTOCA     Labs: Basic Metabolic Panel:  Recent Labs Lab 12/13/14 0405 12/18/14 1630  NA 134* 136  K 3.6 4.0  CL 101 104  CO2 23 28  GLUCOSE 169* 174*  BUN 7 11  CREATININE 0.68 0.69  CALCIUM 7.3* 8.3*   Liver Function Tests:  Recent Labs Lab 12/18/14 1630  AST 22  ALT 24  ALKPHOS 310*  BILITOT 0.5  PROT 6.2  ALBUMIN 2.3*   No results for input(s): LIPASE, AMYLASE in the last 168 hours. No results for input(s): AMMONIA in the last 168 hours. CBC:  Recent Labs Lab 12/13/14 0405 12/14/14 0440 12/15/14 0555 12/17/14 0420 12/18/14 1630  WBC 6.7 7.5 8.3 7.9 6.6  HGB 7.0* 7.3* 10.0* 10.0* 9.8*  HCT 22.3* 23.6* 32.3* 32.2* 32.3*  MCV 91.0 91.5 90.5 92.3 93.1  PLT 181 198 229 242 248   Cardiac Enzymes: No results for input(s): CKTOTAL, CKMB, CKMBINDEX, TROPONINI in the last 168 hours. BNP: BNP (last 3 results) No results for input(s): PROBNP in the last 8760 hours. CBG:  Recent Labs Lab 12/19/14 0014 12/19/14 0513 12/19/14 0734 12/19/14 1128 12/19/14 1559  GLUCAP 123* 144* 139* 156* 192*       Signed:  Derius Ghosh  [facility] 12/19/2014, 5:20 PM

## 2014-12-19 NOTE — Progress Notes (Signed)
Physical Therapy Treatment Patient Details Name: Daryl Vega MRN: [id number] DOB: 1946/11/08 Today's Date: 12/19/2014    History of Present Illness 69 y/o male admitted on 12/16 with a GI bleed.  Underwent endoscopy on 12/17 and was found to have a bleeding duodenal ulcer which rapidly progressed and he was taken to the OR on 12/17 emergently. There, he had an emergency ex laparotomy, oversewing of a posterior bleeding duodenal ulcer, pyloroplasty, cholecystectomy, repair of R hepatic artery, repair of cholecystoduodenal fistula and placement of a duodenostomy tube and feeding J-tube.  PCCM consulted for post op ventilator management.    PT Comments    *Increased overall gait distance, pt walked 300' with RW, loss of balance x 2 requiring min A, HR 123 with walking. **  Follow Up Recommendations  Home health PT;Supervision/Assistance - 24 hour     Equipment Recommendations  Rolling walker with 5" wheels    Recommendations for Other Services       Precautions / Restrictions Precautions Precautions: Fall Restrictions Weight Bearing Restrictions: No    Mobility  Bed Mobility Overal bed mobility: Modified Independent             General bed mobility comments: with rail  Transfers Overall transfer level: Needs assistance Equipment used: Rolling walker (2 wheeled) Transfers: Sit to/from Stand Sit to Stand: Min guard Stand pivot transfers: Min guard       General transfer comment: cues for hand placement, min/guard for balance  Ambulation/Gait Ambulation/Gait assistance: Min assist Ambulation Distance (Feet): 300 Feet Assistive device: Rolling walker (2 wheeled) Gait Pattern/deviations: Step-through pattern Gait velocity: decr   General Gait Details: 2-3/4 DOE, HR 123 walking, LOB x 2 requiring min A (once during a turn, once when pt released RW with one hand to wipe nose)   Stairs            Wheelchair Mobility    Modified Rankin (Stroke Patients  Only)       Balance Overall balance assessment: Needs assistance         Standing balance support: Bilateral upper extremity supported Standing balance-Leahy Scale: Poor Standing balance comment: needs BUE support in standing                    Cognition Arousal/Alertness: Awake/alert Behavior During Therapy: WFL for tasks assessed/performed Overall Cognitive Status: Within Functional Limits for tasks assessed                      Exercises      General Comments        Pertinent Vitals/Pain Pain Score: 9  Pain Location: abdomen Pain Descriptors / Indicators: Sore Pain Intervention(s): Limited activity within patient's tolerance;Monitored during session;Patient requesting pain meds-RN notified    Home Living                      Prior Function            PT Goals (current goals can now be found in the care plan section) Acute Rehab PT Goals Patient Stated Goal: walk PT Goal Formulation: With patient Time For Goal Achievement: 01/02/15 Potential to Achieve Goals: Good Progress towards PT goals: Progressing toward goals    Frequency  Min 3X/week    PT Plan Current plan remains appropriate    Co-evaluation             End of Session   Activity Tolerance: Patient limited by fatigue;Patient limited by pain  Patient left: with call bell/phone within reach;in bed;with family/visitor present     Time: 1232-1300 PT Time Calculation (min) (ACUTE ONLY): 28 min  Charges:  $Gait Training: 23-37 mins                    G Codes:      Blondell Reveal Kistler 12/19/2014, 1:06 PM  [phone]

## 2014-12-19 NOTE — Progress Notes (Addendum)
NUTRITION FOLLOW UP/TF CONSULT  Intervention:  -Recommend to transition to 12 hour nocturnal tube feeding (8pm-8am)  -Initiate Vital AF 1.2 at 30 ml/hr, advance by 10 ml every 4 hour to rate of 60 ml/hr  -Tube feeding regimen will provide 864 kcal (48% est kcal needs, 54 gram protein (64% est protein needs), and 584 ml free water. Will require additional 1400 ml free water via PO intake or flush to meet needs when IVF d'cd  -Recommend Boost Plus BID -Consider use of Zinc at 50 mg daily (up to 60 days) to assist with taste changes -Provided further taste changes education handouts and nutrition supplement coupons -RD to continue to monitor  Nutrition Dx:   Inadequate oral intake related to nausea/taste changes as evidenced by PO intake < 75%, 50 lb weight loss; ongoing, now r/t to taste changes  Goal:   PO intake to meet >/= 90% of their estimated nutrition needs; progressing   Monitor:   Total protein/energy intake, labs, weight, GI profile  Assessment:   12/30: -Pt tolerating Glucerna 1.2 at goal rate of 65 ml/hr Jtube with minimal residual -Received consult to modify tube feeding regimen d/t ongoing loose stools. Per discussion with RN, pt had been experiencing 10-12 loose stools daily; however this was improving and patient had only experienced 3 episodes this morning (~7am-11pm) -Pt with C.diff, and receiving antibiotics; both of which likely contributing to loose stools -Diet advanced to Clear liquids, with plan to advance to full liquids tomorrow as tolerated, and eventual wean of TF -RN reported pt not candidate for anti-diarrheals d/t C.diff -Discussed concern with MD; who was in agreement to look into possible anti-diarrheals as warranted  12/30 ADDENDUM: -Initiate Vital AF 1.2 @ NEW goal rate of 55 ml/hr via Jtube Tube feeding regimen provides 1584 kcal (88% of needs), 100 grams of protein, and 1070 ml of H2O.  Vital AF 1.2 contains scFOS; a prebiotic fiber that promotes  growth of beneficial bacteria of the colon; also may be beneficial for loose stools d/t lower volume goal rate  1/04: -Vital AF 1.2 infusing at 40 ml/hr, providing approximately 1152 kcal, 72 gram protein -Diet advanced to Soft on 1/02. Pt reported consuming 50% of meals; and experiencing loss of taste that contribute to sub-optimal intake -Reviewed nutrition therapy to assist with taste changes (mouth rinses, use of lemon drops or mints) however pt currently refusing oral care per wife as it induces nausea -Consider use of antiemetic or appetite stimulant to assist with PO intake -Pt drinks Boost once daily at home, will discontinue tube feeding and encourage oral care and Boost supplement BID  1/05: -Vital AF 1.2 infusing at 20 ml/hr; decided to wean vs discontinue d/t pt's decrease in PO intake -Pt with improvement of loose stools, and tolerating tube feeding w/out residuals -Continues with poor PO intake taste changes. Provided additional taste change nutrition education and offered use of zinc, and oral care; however pt continues to refuse trying suggested therapies -Meal completion 10%, drinking 100% of Boost BID. Denied current nausea. -Received consult from surgery to modify to nocturnal tube feeding to supplement PO intake; will modify and assess PO intake   Height: Ht Readings from Last 1 Encounters:  11/29/14 5\' 8"  (1.727 m)    Weight Status:   Wt Readings from Last 1 Encounters:  12/07/14 157 lb 3 oz (71.3 kg)    Re-estimated needs:  Kcal: 1800-2000 Protein: 85-100 gram Fluid: >/=2000 ml daily  Skin: closed incision on abdomen  Diet  Order: DIET SOFT   Intake/Output Summary (Last 24 hours) at 12/19/14 1412 Last data filed at 12/19/14 1102  Gross per 24 hour  Intake    810 ml  Output    950 ml  Net   -140 ml    Last BM: 1/04   Labs:   Recent Labs Lab 12/13/14 0405 12/18/14 1630  NA 134* 136  K 3.6 4.0  CL 101 104  CO2 23 28  BUN 7 11  CREATININE  0.68 0.69  CALCIUM 7.3* 8.3*  GLUCOSE 169* 174*    CBG (last 3)   Recent Labs  12/19/14 0513 12/19/14 0734 12/19/14 1128  GLUCAP 144* 139* 156*    Scheduled Meds: . bismuth subsalicylate  30 mL Per Tube BID  . chlorhexidine  15 mL Mouth Rinse BID  . ciprofloxacin  500 mg Oral BID  . feeding supplement (VITAL AF 1.2 CAL)  1,000 mL Per Tube Q24H  . insulin aspart  0-15 Units Subcutaneous 6 times per day  . lactose free nutrition  237 mL Oral BID BM  . multivitamins with iron  1 tablet Oral QPC supper  . pantoprazole  40 mg Oral BID  . potassium chloride  40 mEq Per Tube TID  . saccharomyces boulardii  250 mg Oral BID  . sodium chloride  10-40 mL Intracatheter Q12H  . sodium chloride  3 mL Intravenous Q12H  . vancomycin  125 mg Per Tube 4 times per day    Continuous Infusions: . sodium chloride 10 mL/hr at 12/09/14 2055  . sodium chloride 10 mL/hr at 12/17/14 2111    Atlee Abide [place] Nambe Clinical Dietitian [phone]

## 2014-12-19 NOTE — Progress Notes (Signed)
19 Days Post-Op  Subjective: Open wound looks good.  He is still taking morphine, I am going to decrease that.  Increase oxycodone  Objective: Vital signs in last 24 hours: Temp:  [98 F (36.7 C)-98.5 F (36.9 C)] 98 F (36.7 C) (01/05 0518) Pulse Rate:  [105-108] 108 (01/05 0518) Resp:  [18] 18 (01/05 0518) BP: (96-110)/(59-62) 110/59 mmHg (01/05 0518) SpO2:  [93 %] 93 % (01/05 0518) Last BM Date: 12/18/14 840 PO recorded , 80 by TF recorded. Soft diet 2 stools, 500 urine recorded. Afebrile, VSS Labs last pM look good  Prealbumin up to 18.8 Intake/Output from previous day: 01/04 0701 - 01/05 0700 In: 1040 [P.O.:840; I.V.:120; NG/GT:80] Out: 500 [Urine:500] Intake/Output this shift: Total I/O In: -  Out: 450 [Urine:450]  General appearance: alert, cooperative and no distress GI: soft sore, + BS, +BM recorded, he doesn't remember that.  Open wound ok.  tube sites OK  Lab Results:   Recent Labs  12/17/14 0420 12/18/14 1630  WBC 7.9 6.6  HGB 10.0* 9.8*  HCT 32.2* 32.3*  PLT 242 248    BMET  Recent Labs  12/18/14 1630  NA 136  K 4.0  CL 104  CO2 28  GLUCOSE 174*  BUN 11  CREATININE 0.69  CALCIUM 8.3*   PT/INR No results for input(s): LABPROT, INR in the last 72 hours.   Recent Labs Lab 12/18/14 1630  AST 22  ALT 24  ALKPHOS 310*  BILITOT 0.5  PROT 6.2  ALBUMIN 2.3*     Lipase     Component Value Date/Time   LIPASE 41 11/29/2014 0644     Studies/Results: No results found.  Medications: . bismuth subsalicylate  30 mL Per Tube BID  . chlorhexidine  15 mL Mouth Rinse BID  . ciprofloxacin  500 mg Oral BID  . feeding supplement (VITAL AF 1.2 CAL)  1,000 mL Per Tube Q24H  . insulin aspart  0-15 Units Subcutaneous 6 times per day  . lactose free nutrition  237 mL Oral BID BM  . multivitamins with iron  1 tablet Oral QPC supper  . pantoprazole  40 mg Oral BID  . potassium chloride  40 mEq Per Tube TID  . saccharomyces boulardii  250 mg  Oral BID  . sodium chloride  10-40 mL Intracatheter Q12H  . sodium chloride  3 mL Intravenous Q12H  . vancomycin  125 mg Per Tube 4 times per day    Assessment/Plan 1. 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 Ex Lap, oversewing of posterior bleeding DU, Heineke Mikulicz pyloroplasty, cholecystectomy, repair of right hepatic artery, repair of cholecystoduodenal fistula and placement of duodenostomy tube, feeding jejunostomy - 11/30/14 - Dr. Jackolyn Confer 2. C diff colitis - He has had 5 days of vancomycin per Feeding tube 3. Malnutrition on tube feeding thru J tube Prealbumin 14.3 on 12/12/14 4. Non small cell lung carcinoma, Stage IIIB (T3, N3, M0) undergoing chemotherapy and radiation therapy. (07/2014) 5. Anemia 6. AODM 7. Hypolakemia, Resolved. 8. Probable UTI KLEBSIELLA OXYTOCA >100K  Being treated 9.  Failure to thrive/deconditioning 10  SCD's   PlaN;  Begin working on d/c Plans.  He says his wife is going to doctor today and says she has allot of issues.  Wean down Morphine, I told him he had to get up and walk.  I would continue TF, maybe we could cycle them to something at night to supplement  his PO intake and not have him tied down to the feeding pump during the day.    LOS: 20 days    Makaiah Terwilliger 12/19/2014

## 2014-12-20 LAB — GLUCOSE, CAPILLARY
GLUCOSE-CAPILLARY: 123 mg/dL — AB (ref 70–99)
GLUCOSE-CAPILLARY: 138 mg/dL — AB (ref 70–99)
GLUCOSE-CAPILLARY: 190 mg/dL — AB (ref 70–99)
Glucose-Capillary: 181 mg/dL — ABNORMAL HIGH (ref 70–99)
Glucose-Capillary: 146 mg/dL — ABNORMAL HIGH (ref 70–99)
Glucose-Capillary: 163 mg/dL — ABNORMAL HIGH (ref 70–99)

## 2014-12-20 MED ORDER — DEXTROSE 5 % IV SOLN: INTRAVENOUS | Status: DC

## 2014-12-20 NOTE — Progress Notes (Signed)
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 [name] - POD #15 Emergency Ex Lap, oversewing of posterior bleeding DU, Heineke Mikulicz pyloroplasty, cholecystectomy, repair of right hepatic artery, repair of cholecystoduodenal fistula, placement of duodenostomy tube, feeding jejunostomy. 11/30/14, [name]. - appreciate surgery follow up - duodenostomy tube clamped with cleaning and dressing of ostomy site. -No active bleeding since 12/22, transfused 2 units PRBC on 12/31 for low hemoglobin with improvement -Diet advanced to softl. hospital course prolonged due to on going need for tube feeds. TF switched to nighttime only on 1/5. Po intake has improved. Added nutritional supplements -prescribed prn percocet and phenergan -Case management assisting with setting up nutrition needs at home, pending   Active problems HTN with persistent tachycardia  Heart rate improved after blood transfusion.   C. difficile colitis Completeed 2 weeks course of oral vancomycin on 1/4. changed feeding to VITAL. Continue florastar. Diarrhea Resolved.    Anemia and thrombocytopenia Hb Improved to 10 after 2U prbc on 12/31. Continue PPI. thrombocytopenia resolved  Klebsiella UTI Urine culture from 12/29 growing Klebsiella oxytoca sensitive to quinolones. Treating for a 7 day course, completes on 1/10  Acute respiratory failure with hypoxemia  - atelectasis on CXR, resolved   Stage IIIB Squamous Cell Lung cancer - dx 07/2014, Rx carboplatin and AUC 08/2014 - VDRF, extubated (since 12/18), respiratory fn stable  - continue pulm hygiene: IS, and ambulation - O2 as needed - continue symbicort and xopenex PRN    Metabolic acidosis  - with hypokalemia and  hypomagnesemia. -Replenished   Protein calorie malnutrition, severe  . Continue nutritional supplement. Tolerating soft diet. Plan to discontinuing tube feeds prior to discharge.    DM2  Resume home meds. A1C of 7.3  Abdominal pain when necessary Percocet  Code Status: Full Family Communication: Pt in room (indicate person spoken with, relationship, and if by phone, the number) Disposition Plan: Pending   Consultants:  Surgery  GI  Procedures:  Right IJ central line  PICC line  Surgery as outlined above  Antibiotics:  Oral Vancomycin since 12/23--  IV Rocephin since 12/29  HPI/Subjective: No acute events. Pt reports feeling better.  Objective: Filed Vitals:   12/20/14 0318 12/20/14 0554 12/20/14 1012 12/20/14 1406  BP: 105/62 101/61 102/67 105/62  Pulse: 112 110 109 105  Temp: 98.5 F (36.9 C) 98.8 F (37.1 C) 98 F (36.7 C) 98.2 F (36.8 C)  TempSrc: Oral Oral Oral Oral  Resp: 18 18 18 18   Height:      Weight:      SpO2: 94% 93% 95% 94%    Intake/Output Summary (Last 24 hours) at 12/20/14 1939 Last data filed at 12/20/14 1600  Gross per 24 hour  Intake  742.5 ml  Output      0 ml  Net  742.5 ml   Filed Weights   12/04/14 0400 12/05/14 0400 12/07/14 0423  Weight: 72.7 kg (160 lb 4.4 oz) 71.4 kg (157 lb 6.5 oz) 71.3 kg (157 lb 3 oz)    Exam:   General:  Awake, in nad  Cardiovascular: regular, s1, s2  Respiratory: normal resp effort, no wheezing  Abdomen: post-op dressings in place  Musculoskeletal: perfused, no clubbing   Data Reviewed: Basic Metabolic Panel:  Recent Labs Lab 12/18/14 1630  NA 136  K 4.0  CL 104  CO2 28  GLUCOSE 174*  BUN 11  CREATININE 0.69  CALCIUM 8.3*   Liver Function Tests:  Recent Labs Lab 12/18/14 1630  AST 22  ALT 24  ALKPHOS 310*  BILITOT 0.5  PROT 6.2  ALBUMIN 2.3*   No results for input(s): LIPASE, AMYLASE in the last 168 hours. No results for input(s): AMMONIA in the last  168 hours. CBC:  Recent Labs Lab 12/14/14 0440 12/15/14 0555 12/17/14 0420 12/18/14 1630  WBC 7.5 8.3 7.9 6.6  HGB 7.3* 10.0* 10.0* 9.8*  HCT 23.6* 32.3* 32.2* 32.3*  MCV 91.5 90.5 92.3 93.1  PLT 198 229 242 248   Cardiac Enzymes: No results for input(s): CKTOTAL, CKMB, CKMBINDEX, TROPONINI in the last 168 hours. BNP (last 3 results) No results for input(s): PROBNP in the last 8760 hours. CBG:  Recent Labs Lab 12/20/14 12/20/14 0310 12/20/14 0739 12/20/14 1126 12/20/14 1635  GLUCAP 138* 190* 181* 123* 146*    Recent Results (from the past 240 hour(s))  Urine culture     Status: None   Collection Time: 12/12/14  1:18 AM  Result Value Ref Range Status   Specimen Description URINE, RANDOM  Final   Special Requests NONE  Final   Colony Count   Final    >=100,000 COLONIES/ML Performed at [place]   Final    KLEBSIELLA OXYTOCA Performed at [facility]    Report Status 12/17/2014 FINAL  Final   Organism ID, Bacteria KLEBSIELLA OXYTOCA  Final      Susceptibility   Klebsiella oxytoca - MIC*    AMPICILLIN >=32 RESISTANT Resistant     CEFAZOLIN >=64 RESISTANT Resistant     CEFTRIAXONE 8 SENSITIVE Sensitive     CIPROFLOXACIN <=0.25 SENSITIVE Sensitive     GENTAMICIN <=1 SENSITIVE Sensitive     LEVOFLOXACIN <=0.12 SENSITIVE Sensitive     NITROFURANTOIN 32 SENSITIVE Sensitive     TOBRAMYCIN <=1 SENSITIVE Sensitive     TRIMETH/SULFA <=20 SENSITIVE Sensitive     PIP/TAZO >=128 RESISTANT Resistant     * KLEBSIELLA OXYTOCA     Studies: No results found.  Scheduled Meds: . bismuth subsalicylate  30 mL Per Tube BID  . chlorhexidine  15 mL Mouth Rinse BID  . ciprofloxacin  500 mg Oral BID  . feeding supplement (VITAL AF 1.2 CAL)  1,000 mL Per Tube Q24H  . insulin aspart  0-15 Units Subcutaneous 6 times per day  . lactose free nutrition  237 mL Oral BID BM  . multivitamins with iron  1 tablet Oral QPC supper  . pantoprazole  40 mg Oral  BID  . potassium chloride  40 mEq Per Tube TID  . saccharomyces boulardii  250 mg Oral BID  . sodium chloride  10-40 mL Intracatheter Q12H  . sodium chloride  3 mL Intravenous Q12H   Continuous Infusions: . sodium chloride 10 mL/hr at 12/09/14 2055  . 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 If 7PM-7AM, please contact night-coverage at [email or web address], password [facility] 12/20/2014, 7:39 PM  LOS: 21 days

## 2014-12-20 NOTE — Discharge Summary (Signed)
Physician Discharge Summary  Daryl Vega [facility] SWH:[id number] DOB: [birth year] DOA: 11/29/2014  PCP: [name], MD  Admit date: 11/29/2014 Discharge date: 12/21/2014  Time spent: 35 minutes  Recommendations for Outpatient Follow-up:  1. Discharge home with [facility] and PT 2. 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 Hold TF at 8am. Resume next feeding at 70 ml/hr. -Tube feeding regimen will provide 840 kcal (47% est kcal needs), 53 gram protein (62% est protein needs), and 705 ml free water. Flush tube with 60 ml before and after feeding.  -Will require additional 1200 ml free water via PO intake or flush to meet needs.  -Recommend Boost Plus BID  -Provided further taste changes education handouts and nutrition supplement coupons  Filed Weights   12/04/14 0400 12/05/14 0400 12/07/14 0423  Weight: 72.7 kg (160 lb 4.4 oz) 71.4 kg (157 lb 6.5 oz) 71.3 kg (157 lb 3 oz)    History of present illness:  69 y/o male admitted on 12/16 with a GI bleed. Underwent endoscopy on 12/17 and was found to have a bleeding duodenal ulcer which rapidly progressed and he was taken to the OR on 12/17 emergently.There, he had an emergency ex  laparotomy, oversewing of a posterior bleeding duodenal ulcer, pyloroplasty, cholecystectomy, repair of R hepatic artery, repair of cholecystoduodenal fistula and placement of a duodenostomy tube and feeding J-tube. PCCM consulted for post op ventilator management.  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 Pneumonia 12/21 dark stool, Hgb stable 12/22 no more dark stool, c.diff pcr positive 12/25 - transferred from SDU to tele bed, care transferred to [facility] from [facility]  12/29 - ? UTI based on UA,  12/30: pt having ongoing diarrhea 1/3: Urine culture growing Klebsiella, placed on ciprofloxacin. Diarrhea resolved 1/4 : dced tube feeds, pt c/o nausea. Feeds resumed at lower rate 1/5 : tube feeds transitioned to nocturnal   Hospital Course:  Principle problem Acute GI Bleeding - bleeding duodenal ulcer, cholecystoduodenal fistula due to DU  - EGD for control of bleeding 11/30/16 by Dr. [name] - POD #15 Emergency Ex Lap, oversewing of posterior bleeding DU, Heineke Mikulicz pyloroplasty, cholecystectomy, repair of right hepatic artery, repair of cholecystoduodenal fistula, placement of duodenostomy tube, feeding jejunostomy. 11/30/14, [name]. - appreciate surgery follow up - duodenostomy tube clamped with cleaning and dressing of ostomy site. -No active bleeding since 12/22, transfused 2 units PRBC on 12/31 for low hemoglobin with improvement -Diet advanced to soft and  tolerating well. hospital course prolonged due to on going need for tube feeds. TF switched to nighttime only on 1/5. See above for tube feed recommendations. Po intake has improved. Added nutritional  supplements -prescribed prn percocet and phenergan.   Active problems HTN with persistent tachycardia  Heart rate improved after blood transfusion.   C. difficile colitis Completeed 2 weeks course of oral vancomycin on 1/4. changed feeding to VITAL. Continue florastar. Diarrhea Resolved.    Anemia and thrombocytopenia Hb  Improved to 10 after 2U prbc on 12/31. Continue PPI. thrombocytopenia resolved  Klebsiella UTI Urine culture from 12/29 growing Klebsiella oxytoca sensitive to quinolones. Treating for a 7 day course, completes on 1/10  Acute respiratory failure with hypoxemia  - atelectasis on CXR, resolved   Stage IIIB Squamous Cell Lung cancer - dx 07/2014, Rx carboplatin and AUC 08/2014 - VDRF, extubated (since 12/18), respiratory fn stable  - continue pulm hygiene: IS, and ambulation - O2 as needed - continue symbicort and xopenex PRN  Metabolic acidosis  - with hypokalemia and hypomagnesemia. -Replenished  Protein calorie malnutrition, severe  . Continue nutritional supplement. Tolerating soft diet. -Nocturnal tube feeds per above  DM2  Resume home meds. A1C of 7.3  Abdominal pain when necessary Percocet  Code Status: Full code Family Communication:  D/w wife Disposition Plan: home with [facility]   Consultants:  GI  Surgery  Procedures:  Right IJ central line  PICC line  Surgery as outlined above  Antibiotics:  Completed Oral Vancomycin  IV Rocephin since 12/29, transition to PO ciprofloxacin 1/3  Discharge Exam: Filed Vitals:   12/21/14 1051  BP: 109/73  Pulse: 108  Temp:   Resp:      General: Elderly thin built male in NAD, appears fatigued.   HEENT: pallor +, moist mucosa  Cardiovascular: NS1&S2, no murmurs  Respiratory: clear b/l, no added sounds  Abdomen: soft, ,ND, J-tube in place. Tender, Dressing over the laparotomy site clean. Duodenostomy tube plugged. Bowel sounds present.  Musculoskeletal: Warm, no  edema  CNS: alert and oreinted  Discharge Instructions    Current Discharge Medication List    START taking these medications   Details  bismuth subsalicylate (PEPTO BISMOL) 262 MG/15ML suspension Place 30 mLs into feeding tube 2 (two) times daily. Qty: 360 mL, Refills: 0    ciprofloxacin (CIPRO) 500 MG tablet Take 1 tablet (500 mg total) by mouth 2 (two) times daily. Qty: 10 tablet, Refills: 0    hydrocortisone-pramoxine (ANALPRAM-HC) 2.5-1 % rectal cream Place 1 application rectally every 6 (six) hours as needed for hemorrhoids or itching. Qty: 30 g, Refills: 0    lactose free nutrition (BOOST PLUS) LIQD Take 237 mLs by mouth 2 (two) times daily between meals. Qty: 60 Can, Refills: 0    Multiple Vitamins-Iron (MULTIVITAMINS WITH IRON) TABS tablet Take 1 tablet by mouth daily after supper. Qty: 30 tablet, Refills: 0    oxyCODONE (OXY IR/ROXICODONE) 5 MG immediate release tablet Take 2 tablets (10 mg total) by mouth every 4 (four) hours as needed for moderate pain. Qty: 40 tablet, Refills: 0    pantoprazole (PROTONIX) 40 MG tablet Take 1 tablet (40 mg total) by mouth 2 (two) times daily. Qty: 60 tablet, Refills: 0    saccharomyces boulardii (FLORASTOR) 250 MG capsule Take 1 capsule (250 mg total) by mouth 2 (two) times daily. Qty: 60 capsule, Refills: 0        CONTINUE these medications which have CHANGED   Details  LORazepam (ATIVAN) 0.5  MG tablet Take 1 table by mouth every 12 hours as needed for anxiety Qty: 30 tablet, Refills: 0    !! potassium chloride SA (K-DUR,KLOR-CON) 20 MEQ tablet Take 2 tablets (40 mEq total) by mouth daily. Qty: 30 tablet, Refills: 0   Associated Diagnoses: Hypokalemia    prochlorperazine (COMPAZINE) 10 MG tablet Take 1 tablet (10 mg total) by mouth every 6 (six) hours as needed for nausea or vomiting. Qty: 30 tablet, Refills: 0   Associated Diagnoses: Malignant neoplasm of lung, unspecified laterality, unspecified part of lung         CONTINUE these medications which have NOT CHANGED   Details  atorvastatin (LIPITOR) 80 MG tablet Take 80 mg by mouth daily.    budesonide-formoterol (SYMBICORT) 160-4.5 MCG/ACT inhaler Take 2 puffs first thing in am and then another 2 puffs about 12 hours later. Qty: 1 Inhaler, Refills: 3    Cholecalciferol (VITAMIN D3 PO) Take 10,000 Units by mouth daily.    Cinnamon 500 MG capsule Take 500 mg by mouth daily.    Dapagliflozin Propanediol (FARXIGA) 5 MG TABS Take 5 mg by mouth 2 (two) times daily.    glimepiride (AMARYL) 4 MG tablet Take 4 mg by mouth 2 (two) times daily. Take before meals    metFORMIN (GLUCOPHAGE) 500 MG tablet Take 500 mg by mouth 2 (two) times daily with a meal.    temazepam (RESTORIL) 15 MG capsule Take 1 capsule (15 mg total) by mouth at bedtime as needed for sleep. Qty: 20 capsule, Refills: 0             STOP taking these medications     HYDROcodone-acetaminophen (NORCO) 5-325 MG per tablet      methylPREDNIsolone (MEDROL DOSPACK) 4 MG tablet      omeprazole (PRILOSEC) 20 MG capsule      PRESCRIPTION MEDICATION      potassium chloride (K-DUR) 10 MEQ tablet        No Known Allergies Follow-up Information    Follow up with SPEAR, TAMMY, MD In 1 week.   Specialty:  Family Medicine   Contact information:   [address] [place] [address] [phone]       Follow up with [name], MD. Schedule an appointment as soon as possible for a visit in 4 weeks.   Specialty:  Gastroenterology   Contact information:   [address] [place] [address] [phone]       Follow up with Odis Hollingshead, MD In 2 weeks.   Specialty:  General Surgery   Why:  Call for follow up appointment.  Continue wet to dry dressings till site is healed.   Contact information:   [address] [place] [address] [phone]       Follow up with [facility].   Why:   HHRN-instruction-wound care/TF, HHPT/OT/nurse's aide/SW   Contact information:   [address] [place] [address] [phone]       Follow up with [facility].   Why:  Feeding pump/Formula   Contact information:   [address] [place] [address] [phone]        The results of significant diagnostics from this hospitalization (including imaging, microbiology, ancillary and laboratory) are listed below for reference.    Significant Diagnostic Studies: Ct Abdomen Pelvis W Contrast  12/11/2014   CLINICAL DATA:  69 year old male with history of a bleeding  duodenal ulcer with cholecystoduodenal fistula status post surgical repair. Additional history of non-small-cell lung cancer undergoing chemotherapy and radiation therapy (August 2015).  EXAM: CT ABDOMEN AND PELVIS WITH CONTRAST  TECHNIQUE: Multidetector CT imaging of the abdomen and pelvis was performed using the standard protocol following bolus administration of intravenous contrast.  CONTRAST:  25 mL OMNIPAQUE IOHEXOL 300 MG/ML SOLN, 139mL OMNIPAQUE IOHEXOL 300 MG/ML SOLN  COMPARISON:  PET-CT 07/26/2014.  FINDINGS: Lower chest: Small bilateral pleural effusions (right greater than left), layering dependently. Small amount of pericardial fluid and/or thickening, unlikely to be of hemodynamic significance at this time, but new compared to the prior examination.  Hepatobiliary: No cystic or solid hepatic lesions. No intra or extrahepatic biliary ductal dilatation. Status post cholecystectomy. Small linear appearing area of low attenuation in the inferior aspect of segment 3 of the liver (image 22 of series 2) may represent a small contusion. Beneath the cholecystectomy clips there is a 4.2 x 6.3 x 2.3 cm postoperative fluid collection which tracks inferiorly along the posterior and lateral surface of the duodenum, best appreciated on image 30 of series 2, and sagittal image 33.  Pancreas: Unremarkable.  Specifically, no pancreatic ductal dilatation.  Spleen: Unremarkable.  Adrenals/Urinary Tract: Several sub cm low-attenuation lesions are noted in the kidneys bilaterally, too small to characterize, but statistically likely a tiny cysts. In addition, there is a 1.4 cm simple cyst in the upper pole of the right kidney. 1.6 cm right adrenal nodule is unchanged, previously characterized as an adenoma. Left adrenal gland is normal in appearance. No hydroureteronephrosis. Urinary bladder is remarkable for extensive gas in the wall of the urinary bladder.  Stomach/Bowel: Normal appearance of the stomach. Duodenostomy tube in position with tip terminating in the second portion of the duodenum. There is a small amount of extraluminal gas adjacent to the duodenostomy catheter immediately anterior to the duodenal bulb (image 26 of series 2 and sagittal image 42). Notably, although there is oral contrast material in the stomach and duodenum, there is no leakage of oral contrast material outside the duodenum. No larger volume of pneumoperitoneum is otherwise noted. Percutaneous jejunostomy tube noted. No pathologic dilatation of small bowel or colon. Diffuse thickening of the colonic and rectal walls, concerning for colitis. Inflammatory changes in the overlying mesocolon. 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  Vascular/Lymphatic: Moderate atherosclerosis throughout the abdominal and pelvic vasculature, without evidence of aneurysm or dissection.  Reproductive: Prostate gland and seminal vesicles are unremarkable in appearance.  Other: Small volume of ascites, most evident adjacent to the liver and spleen.  Musculoskeletal: Midline surgical clips in the anterior abdominal wall. There are no aggressive appearing lytic or blastic lesions noted in the visualized portions of the skeleton.  IMPRESSION: 1. Postoperative changes of cholecystectomy with the duodenostomy tube in  position. There is a small amount of extraluminal gas adjacent to the duodenostomy tube, but no large volume of pneumoperitoneum, and no evidence of leakage oral contrast material from the duodenum at this time. In addition, there is a small amount of postoperative fluid tracking inferiorly from the gallbladder fossa immediately posterior and lateral to the second portion of the duodenum. This may simply represent a postoperative seroma, although the possibility of a biloma is not excluded. 2. Diffuse thickening of the wall of the colon and rectum, concerning for a colitis such as C difficile colitis. 3. Gas in the wall of the urinary bladder. The wall of the urinary bladder otherwise does not  appear inflamed. This may simply be iatrogenic related to recent Foley catheter placement, however, urinalysis and clinical correlation is recommended to exclude signs or symptoms of emphysematous cystitis. 4. Small bilateral pleural effusions (right greater than left), and small pericardial effusion and/or thickening, new compared to prior PET-CT 07/26/2014. 5. Additional incidental findings, as above. These results were called by telephone at the time of interpretation on 12/11/2014 at 11:10 am to Dr. Alphonsa Overall, who verbally acknowledged these results.   Electronically Signed   By: Vinnie Langton M.D.   On: 12/11/2014 11:13   Dg Chest Port 1 View  12/05/2014   CLINICAL DATA:  Lung atelectatic change  EXAM: PORTABLE CHEST - 1 VIEW  COMPARISON:  December 04, 2014  FINDINGS: Tube and catheter positions are unchanged without pneumothorax. There is patchy infiltrate with volume loss right upper lobe. There is also patchy infiltrate left lower lobe. No new opacity. Heart is normal in size and contour. Pulmonary vascular is normal. No adenopathy.  IMPRESSION: Right upper lobe and left lower lobe infiltrate. Volume loss right upper lobe, stable. No change in cardiac silhouette. Tube and catheter positions unchanged. No  pneumothorax.   Electronically Signed   By: Lowella Grip M.D.   On: 12/05/2014 07:16   Dg Chest Port 1 View  12/04/2014   CLINICAL DATA:  Central catheter placement  EXAM: PORTABLE CHEST - 1 VIEW  COMPARISON:  December 03, 2014  FINDINGS: Central catheter tip is in the right atrium, slightly beyond the cavoatrial junction. Nasogastric tube tip and side port are below the diaphragm. Cordis has been removed from the right side. No pneumothorax. There is infiltrate and volume loss the right upper lobe, stable. There is focal infiltrate in the left lower lobe is well. Heart size and pulmonary vascularity are normal. No adenopathy.  IMPRESSION: Tube and catheter positions as described without pneumothorax. Volume loss with patchy infiltrate right upper lobe. Focal area of infiltrate left base as well.   Electronically Signed   By: Lowella Grip M.D.   On: 12/04/2014 19:01   Dg Chest Port 1 View  12/03/2014   CLINICAL DATA:  Acute onset of respiratory distress. Personal history of smoking. Initial encounter.  EXAM: PORTABLE CHEST - 1 VIEW  COMPARISON:  Chest radiograph performed 11/30/2014  FINDINGS: An enteric tube is seen extending below the diaphragm. A right IJ line is noted ending overlying the proximal to mid SVC.  The lungs are well-aerated. Right apical pleural parenchymal scarring is again noted. Increased interstitial markings are new from November, with mild right perihilar and left basilar airspace opacities. This could reflect mild pneumonia, given the patient's symptoms. There is no evidence of pleural effusion or pneumothorax.  The cardiomediastinal silhouette is within normal limits. No acute osseous abnormalities are seen.  IMPRESSION: Mild right perihilar and left basilar airspace opacities could reflect mild pneumonia, superimposed on the patient's chronic lung changes.   Electronically Signed   By: Garald Balding M.D.   On: 12/03/2014 02:58   Portable Chest Xray  11/30/2014    CLINICAL DATA:  Acute respiratory failure and hypoxia following abdominal surgery today.  EXAM: PORTABLE CHEST - 1 VIEW  COMPARISON:  10/28/2014  FINDINGS: Endotracheal tube tip 2.4 cm above the carina. Right IJ introducer sheath tip: Is SVC. Nasogastric tube enters the stomach.  And in addition to the bandlike presumed scarring in the right upper lobe shown on the prior exam, There is abnormal interstitial accentuation bilaterally is and some subsegmental atelectasis at  the left lung base. No overt airspace edema. Heart size within normal limits. Degenerative right glenohumeral arthropathy. No pneumothorax.  IMPRESSION: 1. Increased bilateral interstitial accentuation compared to the prior exam, possibly from noncardiogenic edema. No overt cardiomegaly. 2. Subsegmental atelectasis at the left lung base. 3. Continued scarring in the right upper lobe. 4. Tubes and lines appear satisfactorily positioned.   Electronically Signed   By: Sherryl Barters M.D.   On: 11/30/2014 18:05    Microbiology: Recent Results (from the past 240 hour(s))  Urine culture     Status: None   Collection Time: 12/12/14  1:18 AM  Result Value Ref Range Status   Specimen Description URINE, RANDOM  Final   Special Requests NONE  Final   Colony Count   Final    >=100,000 COLONIES/ML Performed at [place]   Final    KLEBSIELLA OXYTOCA Performed at [facility]    Report Status 12/17/2014 FINAL  Final   Organism ID, Bacteria KLEBSIELLA OXYTOCA  Final      Susceptibility   Klebsiella oxytoca - MIC*    AMPICILLIN >=32 RESISTANT Resistant     CEFAZOLIN >=64 RESISTANT Resistant     CEFTRIAXONE 8 SENSITIVE Sensitive     CIPROFLOXACIN <=0.25 SENSITIVE Sensitive     GENTAMICIN <=1 SENSITIVE Sensitive     LEVOFLOXACIN <=0.12 SENSITIVE Sensitive     NITROFURANTOIN 32 SENSITIVE Sensitive     TOBRAMYCIN <=1 SENSITIVE Sensitive     TRIMETH/SULFA <=20 SENSITIVE Sensitive     PIP/TAZO >=128 RESISTANT  Resistant     * KLEBSIELLA OXYTOCA     Labs: Basic Metabolic Panel:  Recent Labs Lab 12/18/14 1630 12/21/14 0413  NA 136 136  K 4.0 3.8  CL 104 101  CO2 28 28  GLUCOSE 174* 239*  BUN 11 12  CREATININE 0.69 0.76  CALCIUM 8.3* 8.3*   Liver Function Tests:  Recent Labs Lab 12/18/14 1630  AST 22  ALT 24  ALKPHOS 310*  BILITOT 0.5  PROT 6.2  ALBUMIN 2.3*   No results for input(s): LIPASE, AMYLASE in the last 168 hours. No results for input(s): AMMONIA in the last 168 hours. CBC:  Recent Labs Lab 12/15/14 0555 12/17/14 0420 12/18/14 1630 12/21/14 0413  WBC 8.3 7.9 6.6 7.5  HGB 10.0* 10.0* 9.8* 9.7*  HCT 32.3* 32.2* 32.3* 31.6*  MCV 90.5 92.3 93.1 93.5  PLT 229 242 248 257   Cardiac Enzymes: No results for input(s): CKTOTAL, CKMB, CKMBINDEX, TROPONINI in the last 168 hours. BNP: BNP (last 3 results) No results for input(s): PROBNP in the last 8760 hours. CBG:  Recent Labs Lab 12/20/14 2027 12/20/14 2323 12/21/14 0433 12/21/14 0730 12/21/14 1109  GLUCAP 163* 188* 228* 205* 144*   Signed:  Jecenia Leamer K  [facility] 12/21/2014, 12:24 PM

## 2014-12-20 NOTE — Progress Notes (Signed)
[facility]   [facility] is providing the following services: TF (formula, pump, supplies)  Received referral from CM for patient to receive home TF via pump.  Per [facility] RD - she reviewed this patients notes for Vital and she stated that the patient does not qualify for TF. According to the RD notes in the paperwork patient is able to eat soft foods and is able to get his nutrients through drinking oral supplements.  Medicare will not pay for this.  I reached out to our office to see if there were any other options for this patient, and the response that I received back was that the patient could private pay.  He would need a pump due to the J tube.  If private paying, may want to consider a different formula as Vital AF is very expensive.  She understands that the patient was having diarrhea from c diff infection.  She suggested Promote as a formula that may be reasonable for him, if this is the option that he choses. I will contact the wife to let her know that we are currently working on this.    If patient discharges after hours, please call [phone].   Linward Headland 12/20/2014, 2:55 PM

## 2014-12-20 NOTE — Discharge Instructions (Signed)
[facility] °[phone] ° °OPEN ABDOMINAL SURGERY: POST OP INSTRUCTIONS ° °Always review your discharge instruction sheet given to you by the facility where your surgery was performed. ° °IF YOU HAVE DISABILITY OR FAMILY LEAVE FORMS, YOU MUST BRING THEM TO THE OFFICE FOR PROCESSING.  PLEASE DO NOT GIVE THEM TO YOUR DOCTOR. ° °1. A prescription for pain medication may be given to you upon discharge.  Take your pain medication as prescribed, if needed.  If narcotic pain medicine is not needed, then you may take acetaminophen (Tylenol) or ibuprofen (Advil) as needed. °2. Take your usually prescribed medications unless otherwise directed. °3. If you need a refill on your pain medication, please contact your pharmacy. They will contact our office to request authorization.  Prescriptions will not be filled after 5pm or on week-ends. °4. You should follow a light diet the first few days after arrival home, such as soup and crackers, pudding, etc.unless your doctor has advised otherwise. A high-fiber, low fat diet can be resumed as tolerated.   Be sure to include lots of fluids daily. Most patients will experience some swelling and bruising on the chest and neck area.  Ice packs will help.  Swelling and bruising can take several days to resolve °5. Most patients will experience some swelling and bruising in the area of the incision. Ice pack will help. Swelling and bruising can take several days to resolve..  °6. It is common to experience some constipation if taking pain medication after surgery.  Increasing fluid intake and taking a stool softener will usually help or prevent this problem from occurring.  A mild laxative (Milk of Magnesia or Miralax) should be taken according to package directions if there are no bowel movements after 48 hours. °7.  You may have steri-strips (small skin tapes) in place directly over the incision.  These strips should be left on the skin for 7-10 days.  If your  surgeon used skin glue on the incision, you may shower in 24 hours.  The glue will flake off over the next 2-3 weeks.  Any sutures or staples will be removed at the office during your follow-up visit. You may find that a light gauze bandage over your incision may keep your staples from being rubbed or pulled. You may shower and replace the bandage daily. °8. ACTIVITIES:  You may resume regular (light) daily activities beginning the next day--such as daily self-care, walking, climbing stairs--gradually increasing activities as tolerated.  You may have sexual intercourse when it is comfortable.  Refrain from any heavy lifting or straining until approved by your doctor. °a. You may drive when you no longer are taking prescription pain medication, you can comfortably wear a seatbelt, and you can safely maneuver your car and apply brakes °b. Return to Work: ___________________________________ °9. You should see your doctor in the office for a follow-up appointment approximately two weeks after your surgery.  Make sure that you call for this appointment within a day or two after you arrive home to insure a convenient appointment time. °OTHER INSTRUCTIONS:  °_____________________________________________________________ °_____________________________________________________________ ° °WHEN TO CALL YOUR DOCTOR: °1. Fever over 101.0 °2. Inability to urinate °3. Nausea and/or vomiting °4. Extreme swelling or bruising °5. Continued bleeding from incision. °6. Increased pain, redness, or drainage from the incision. °7. Difficulty swallowing or breathing °8. Muscle cramping or spasms. °9. Numbness or tingling in hands or feet or around lips. ° °The clinic staff is available to   answer your questions during regular business hours.  Please dont hesitate to call and ask to speak to one of the nurses if you have concerns.  For further questions, please visit [email or web address] Care of a Feeding Tube People who have  trouble swallowing or cannot take food or medicine by mouth are sometimes given feeding tubes. A feeding tube can go into the nose and down to the stomach or through the skin in the abdomen and into the stomach or small bowel. Some of the names of these feeding tubes are gastrostomy tubes, PEG lines, nasogastric tubes, and gastrojejunostomy tubes.  SUPPLIES NEEDED TO CARE FOR THE TUBE SITE  Clean gloves.  Clean wash cloth, gauze pads, or soft paper towel.  Cotton swabs.  Skin barrier ointment or cream.  Soap and water.  Pre-cut foam pads or gauze (that go around the tube).  Tube tape. TUBE SITE CARE  Have all supplies ready and available.  Wash hands well.  Put on clean gloves.  Remove the soiled foam pad or gauze, if present, that is found under the tube stabilizer. Change the foam pad or gauze daily or when soiled or moist.  Check the skin around the tube site for redness, rash, swelling, drainage, or extra tissue growth. If you notice any of these, call your caregiver.  Moisten gauze and cotton swabs with water and soap.  Wipe the area closest to the tube (right near the stoma) with cotton swabs. Wipe the surrounding skin with moistened gauze. Rinse with water.  Dry the skin and stoma site with a dry gauze pad or soft paper towel. Do not use antibiotic ointments at the tube site.  If the skin is red, apply a skin barrier cream or ointment (such as petroleum jelly) in a circular motion, using a cotton swab. The cream or ointment will provide a moisture barrier for the skin and helps with wound healing.  Apply a new pre-cut foam pad or gauze around the tube. Secure it with tape around the edges. If no drainage is present, foam pads or gauze may be left off.  Use tape or an anchoring device to fasten the feeding tube to the skin for comfort or as directed. Rotate where you tape the tube to avoid skin damage from the adhesive.  Position the person in a semi-upright position  (30-45 degree angle).  Throw away used supplies.  Remove gloves.  Wash hands. SUPPLIES NEEDED TO FLUSH A FEEDING TUBE  Clean gloves.  60 mL syringe (that connects to the feeding tube).  Towel.  Water. FLUSHING A FEEDING TUBE   Have all supplies ready and available.  Wash hands well.  Put on clean gloves.  Draw up 30 mL of water in the syringe.  Kink the feeding tube while disconnecting it from the feeding-bag tubing or while removing the plug at the end of the tube. Kinking closes the tube and prevents secretions in the tube from spilling out.  Insert the tip of the syringe into the end of the feeding tube. Release the kink. Slowly inject the water.  If unable to inject the water, the person with the feeding tube should lay on his or her left side. The tip of the tube may be against the stomach wall, blocking fluid flow. Changing positions may move the tip away from the stomach wall. After repositioning, try injecting the water again.  After injecting the water, remove the syringe.  Always flush before giving the first medicine, between medicines, and after  the final medicine before starting a feeding. This prevents medicines from clogging the tube.  Throw away used supplies.  Remove gloves.  Wash hands. Document Released: 12/01/2005 Document Revised: 11/17/2012 Document Reviewed: 07/15/2012 [employer] Patient Information 2015 [place], [place]. This information is not intended to replace advice given to you by your health care provider. Make sure you discuss any questions you have with your health care provider. Dressing Change A dressing is a material placed over wounds. It keeps the wound clean, dry, and protected from further injury.  BEFORE YOU BEGIN Get your supplies together. Things you may need include: Salt solution (saline). Flexible gauze bandage. Medicated cream. Tape. Gloves. Belly (abdominal) pads. Gauze squares. Plastic bags. Take pain medicine 30  minutes before the bandage change if you need it. Take a shower before you do the first bandage change of the day. Put plastic wrap or a bag over the dressing. REMOVING YOUR OLD BANDAGE Wash your hands with soap and water. Dry your hands with a clean towel. Put on your gloves. Remove any tape. Remove the old bandage as told. If it sticks, put a small amount of warm water on it to loosen the bandage. Remove any gauze or packing tape in your wound. Take off your gloves. Put the gloves, tape, gauze, or any packing tape in a plastic bag. CHANGING YOUR BANDAGE Open the supplies. Take the cap off the salt solution. Open the gauze. Leave the gauze on the inside of the package. Put on your gloves. Clean your wound as told by your doctor. Keep your wound dry if your doctor told you to do so. Your doctor may tell you to do one or more of the following: Pick up the gauze. Pour the salt solution over the gauze. Squeeze out the extra salt solution. Put medicated cream or other medicine on your wound. Put solution soaked gauze only in your wound, not on the skin around it. Pack your wound loosely. Put dry gauze on your wound. Put belly pads over the dry gauze if your bandages soak through. Tape the bandage in place so it will not fall off. Do not wrap the tape all the way around your arm or leg. Wrap the bandage with the flexible gauze bandage as told by your doctor. Take off your gloves. Put them in the plastic bag with the old bandage. Tie the bag shut and throw it away. Keep the bandage clean and dry. Wash your hands. GET HELP RIGHT AWAY IF:  Your skin around the wound looks red. Your wound feels more tender or sore. You see yellowish-white fluid (pus) in the wound. Your wound smells bad. You have a fever. Your skin around the wound has a red rash that itches and burns. You see black or yellow skin in your wound that was not there before. You feel sick to your stomach (nauseous), throw up  (vomit), and feel very tired. Document Released: 02/27/2009 Document Revised: 04/17/2014 Document Reviewed: 10/12/2011 [facility] Patient Information 2015 [place], [place]. This information is not intended to replace advice given to you by your health care provider. Make sure you discuss any questions you have with your health care provider. Gastrostomy Tube Home Guide A gastrostomy tube is a tube that is surgically placed into the stomach. It is also called a "G-tube." G-tubes are used when a person is unable to eat and drink enough on their own to stay healthy. The tube is inserted into the stomach through a small cut (incision) in the skin.  This tube is used for:  Feeding.  Giving medication. GASTROSTOMY TUBE CARE  Wash your hands with soap and water.  Remove the old dressing (if any). Some styles of G-tubes may need a dressing inserted between the skin and the G-tube. Other types of G-tubes do not require a dressing. Ask your health care provider if a dressing is needed.  Check the area where the tube enters the skin (insertion site) for redness, swelling, or pus-like (purulent) drainage. A small amount of clear or tan liquid drainage is normal. Check to make sure scar tissue (skin) is not growing around the insertion site. This could have a raised, bumpy appearance.  A cotton swab can be used to clean the skin around the tube:  When the G-tube is first put in, a normal saline solution or water can be used to clean the skin.  Mild soap and warm water can be used when the skin around the G-tube site has healed.  Roll the cotton swab around the G-tube insertion site to remove any drainage or crusting at the insertion site. STOMACH RESIDUALS Feeding tube residuals are the amount of liquids that are in the stomach at any given time. Residuals may be checked before giving feedings, medications, or as instructed by your health care provider.  Ask your health care provider if there are instances  when you would not start tube feedings depending on the amount or type of contents withdrawn from the stomach.  Check residuals by attaching a syringe to the G-tube and pulling back on the syringe plunger. Note the amount, and return the residual back into the stomach. FLUSHING THE G-TUBE  The G-tube should be periodically flushed with clean warm water to keep it from clogging.  Flush the G-tube after feedings or medications. Draw up 30 mL of warm water in a syringe. Connect the syringe to the G-tube and slowly push the water into the tube.  Do not push feedings, medications, or flushes rapidly. Flush the G-tube gently and slowly.  Only use syringes made for G-tubes to flush medications or feedings.  Your health care provider may want the G-tube flushed more often or with more water. If this is the case, follow your health care provider's instructions. FEEDINGS Your health care provider will determine whether feedings are given as a bolus (a certain amount given at one time and at scheduled times) or whether feedings will be given continuously on a feeding pump.   Formulas should be given at room temperature.  If feedings are continuous, no more than 4 hours worth of feedings should be placed in the feeding bag. This helps prevent spoilage or accidental excess infusion.  Cover and place unused formula in the refrigerator.  If feedings are continuous, stop the feedings when medications or flushes are given. Be sure to restart the feedings.  Feeding bags and syringes should be replaced as instructed by your health care provider. GIVING MEDICATION   In general, it is best if all medications are in a liquid form for G-tube administration. Liquid medications are less likely to clog the G-tube.  Mix the liquid medication with 30 mL (or amount recommended by your health care provider) of warm water.  Draw up the medication into the syringe.  Attach the syringe to the G-tube and slowly push  the mixture into the G-tube.  After giving the medication, draw up 30 mL of warm water in the syringe and slowly flush the G-tube.  For pills or capsules, check with  your health care provider first before crushing medications. Some pills are not effective if they are crushed. Some capsules are sustained-release medications.  If appropriate, crush the pill or capsule and mix with 30 mL of warm water. Using the syringe, slowly push the medication through the tube, then flush the tube with another 30 mL of tap water. G-TUBE PROBLEMS G-tube was pulled out.  Cause: May have been pulled out accidentally.  Solutions: Cover the opening with clean dressing and tape. Call your health care provider right away. The G-tube should be put in as soon as possible (within 4 hours) so the G-tube opening (tract) does not close. The G-tube needs to be put in at a health care setting. An X-ray needs to be done to confirm placement before the G-tube can be used again. Redness, irritation, soreness, or foul odor around the gastrostomy site.  Cause: May be caused by leakage or infection.  Solutions: Call your health care provider right away. Large amount of leakage of fluid or mucus-like liquid present (a large amount means it soaks clothing).  Cause: Many reasons could cause the G-tube to leak.  Solutions: Call your health care provider to discuss the amount of leakage. Skin or scar tissue appears to be growing where tube enters skin.   Cause: Tissue growth may develop around the insertion site if the G-tube is moved or pulled on excessively.  Solutions: Secure tube with tape so that excess movement does not occur. Call your health care provider. G-tube is clogged.  Cause: Thick formula or medication.  Solutions: Try to slowly push warm water into the tube with a large syringe. Never try to push any object into the tube to unclog it. Do not force fluid into the G-tube. If you are unable to unclog the tube,  call your health care provider right away. TIPS  Head of bed (HOB) position refers to the upright position of a person's upper body.  When giving medications or a feeding bolus, keep the [facility] up as told by your health care provider. Do this during the feeding and for 1 hour after the feeding or medication administration.  If continuous feedings are being given, it is best to keep the [facility] up as told by your health care provider. When ADLs (activities of daily living) are performed and the [facility] needs to be flat, be sure to turn the feeding pump off. Restart the feeding pump when the [facility] is returned to the recommended height.  Do not pull or put tension on the tube.  To prevent fluid backflow, kink the G-tube before removing the cap or disconnecting a syringe.  Check the G-tube length every day. Measure from the insertion site to the end of the G-tube. If the length is longer than previous measurements, the tube may be coming out. Call your health care provider if you notice increasing G-tube length.  Oral care, such as brushing teeth, must be continued.  You may need to remove excess air (vent) from the G-tube. Your health care provider will tell you if this is needed.  Always call your health care provider if you have questions or problems with the G-tube. SEEK IMMEDIATE MEDICAL CARE IF:   You have severe abdominal pain, tenderness, or abdominal bloating (distension).  You have nausea or vomiting.  You are constipated or have problems moving your bowels.  The G-tube insertion site is red, swollen, has a foul smell, or has yellow or brown drainage.  You have difficulty breathing or  shortness of breath.  You have a fever.  You have a large amount of feeding tube residuals.  The G-tube is clogged and cannot be flushed. MAKE SURE YOU:   Understand these instructions.  Will watch your condition.  Will get help right away if you are not doing well or get worse. Document Released:  02/09/2002 Document Revised: 04/17/2014 Document Reviewed: 08/08/2013 [facility] Patient Information 2015 [place], [place]. This information is not intended to replace advice given to you by your health care provider. Make sure you discuss any questions you have with your health care provider.

## 2014-12-20 NOTE — Progress Notes (Signed)
20 Days Post-Op  Subjective: He says he walked some yesterday, they have him back on Enteric precautions again.  He doesn't seem to understand he mam be going home.  Wound ok, we need to pack the wound all the way to the base..  Tube feeding is off now and they are going to PM nocturnal feedings.  They recommend Zinc to help with taste issues.  I will defer that to Medicine.  Objective: Vital signs in last 24 hours: Temp:  [97.8 F (36.6 C)-99.2 F (37.3 C)] 98.8 F (37.1 C) (01/06 0554) Pulse Rate:  [103-123] 110 (01/06 0554) Resp:  [18] 18 (01/06 0554) BP: (101-111)/(61-65) 101/61 mmHg (01/06 0554) SpO2:  [93 %-95 %] 93 % (01/06 0554) Last BM Date: 12/18/14 120 PO recorded Afebrile, VSS No labs today Intake/Output from previous day: 01/05 0701 - 01/06 0700 In: 632.5 [P.O.:120; I.V.:130; NG/GT:382.5] Out: 850 [Urine:850] Intake/Output this shift:    General appearance: alert, cooperative, no distress and cachectic GI: soft, non-tender; bowel sounds normal; no masses,  no organomegaly and open wound is clean but there is tracking all the way to the base.  I will have them pack the wound to the base with open tip of 4 x 4.    Lab Results:   Recent Labs  12/18/14 1630  WBC 6.6  HGB 9.8*  HCT 32.3*  PLT 248    BMET  Recent Labs  12/18/14 1630  NA 136  K 4.0  CL 104  CO2 28  GLUCOSE 174*  BUN 11  CREATININE 0.69  CALCIUM 8.3*   PT/INR No results for input(s): LABPROT, INR in the last 72 hours.   Recent Labs Lab 12/18/14 1630  AST 22  ALT 24  ALKPHOS 310*  BILITOT 0.5  PROT 6.2  ALBUMIN 2.3*     Lipase     Component Value Date/Time   LIPASE 41 11/29/2014 0644     Studies/Results: No results found.  Medications: . bismuth subsalicylate  30 mL Per Tube BID  . chlorhexidine  15 mL Mouth Rinse BID  . ciprofloxacin  500 mg Oral BID  . feeding supplement (VITAL AF 1.2 CAL)  1,000 mL Per Tube Q24H  . insulin aspart  0-15 Units Subcutaneous 6 times  per day  . lactose free nutrition  237 mL Oral BID BM  . multivitamins with iron  1 tablet Oral QPC supper  . pantoprazole  40 mg Oral BID  . potassium chloride  40 mEq Per Tube TID  . saccharomyces boulardii  250 mg Oral BID  . sodium chloride  10-40 mL Intracatheter Q12H  . sodium chloride  3 mL Intravenous Q12H    Assessment/Plan 1. 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 Ex Lap, oversewing of posterior bleeding DU, Heineke Mikulicz pyloroplasty, cholecystectomy, repair of right hepatic artery, repair of cholecystoduodenal fistula and placement of duodenostomy tube, feeding jejunostomy - 11/30/14 - Dr. Jackolyn Confer 2. C diff colitis - He has had 5 days of vancomycin per Feeding tube 3. Malnutrition on tube feeding thru J tube Prealbumin 14.3 on 12/12/14 4. Non small cell lung carcinoma, Stage IIIB (T3, N3, M0) undergoing chemotherapy and radiation therapy. (07/2014) 5. Anemia 6. AODM 7. Hypolakemia, Resolved. 8. Probable UTI KLEBSIELLA OXYTOCA >100K Being treated 9. Failure to thrive/deconditioning 10 SCD's  Plan:  I would be sure family knows how to handle tubes and pack wound before he goes.  I will  be glad to talk to them if you like about this.  He will need home health to assist with everything.  I would think a home health aide if family wishes, PT, and RN. He is still taking a fair amount of pain medicine and he is taking allot more than his wounds wound require.  I would leave that to Medicine to sort out also.  He needs follow up with oncology, he was in therapy with them for his cancer.  I would agree with PM Tube feedings, as supplement till we know he is taking in adequate calories.  It would be good if we could decide who is going to follow up with this after he goes home, before he leaves the hospital so we can coordinate care.  Call if I can help.   Will  [facility] [phone]  12/20/2014 11:15 AM        LOS: 21 days    JENNINGS,Daryl Vega 12/20/2014

## 2014-12-21 LAB — BASIC METABOLIC PANEL
Anion gap: 7 (ref 5–15)
BUN: 12 mg/dL (ref 6–23)
CO2: 28 mmol/L (ref 19–32)
Calcium: 8.3 mg/dL — ABNORMAL LOW (ref 8.4–10.5)
Chloride: 101 mEq/L (ref 96–112)
Creatinine, Ser: 0.76 mg/dL (ref 0.50–1.35)
Glucose, Bld: 239 mg/dL — ABNORMAL HIGH (ref 70–99)
Potassium: 3.8 mmol/L (ref 3.5–5.1)
SODIUM: 136 mmol/L (ref 135–145)

## 2014-12-21 LAB — CBC
HCT: 31.6 % — ABNORMAL LOW (ref 39.0–52.0)
Hemoglobin: 9.7 g/dL — ABNORMAL LOW (ref 13.0–17.0)
MCH: 28.7 pg (ref 26.0–34.0)
MCHC: 30.7 g/dL (ref 30.0–36.0)
MCV: 93.5 fL (ref 78.0–100.0)
PLATELETS: 257 10*3/uL (ref 150–400)
RBC: 3.38 MIL/uL — ABNORMAL LOW (ref 4.22–5.81)
RDW: 17.3 % — ABNORMAL HIGH (ref 11.5–15.5)
WBC: 7.5 10*3/uL (ref 4.0–10.5)

## 2014-12-21 LAB — GLUCOSE, CAPILLARY
GLUCOSE-CAPILLARY: 188 mg/dL — AB (ref 70–99)
GLUCOSE-CAPILLARY: 144 mg/dL — AB (ref 70–99)
Glucose-Capillary: 228 mg/dL — ABNORMAL HIGH (ref 70–99)
Glucose-Capillary: 205 mg/dL — ABNORMAL HIGH (ref 70–99)

## 2014-12-21 MED ORDER — PROMOTE PO LIQD
1000.0000 mL | ORAL | Status: DC
  Filled 2014-12-21: qty 1000

## 2014-12-21 NOTE — Progress Notes (Addendum)
NUTRITION FOLLOW UP  Intervention:  -Due to improvement in loose stools, pt does not require specialty formula, and will modify tube feeding formula to assist in home discharge needs -Recommend 12 hour nocturnal feeds from 8pm-8am -At 8pm, initiate Promote at 34ml/hr, advance by 10 ml every 4 hour to rate of 70 ml/hr (approximately 3.5 cans/night). Hold TF at 8am. Resume next feeding at 70 ml/hr.  -Tube feeding regimen will provide 840 kcal (47% est kcal needs), 53 gram protein (62% est protein needs), and 705 ml free water. Flush tube with 60 ml before and after feeding.   -Will require additional 1200 ml free water via PO intake or flush to meet needs.  -Recommend Boost Plus BID -Consider use of Zinc at 50 mg daily (up to 60 days) to assist with taste changes -RD to continue to monitor  Nutrition Dx:   Inadequate oral intake related to nausea/taste changes as evidenced by PO intake < 75%, 50 lb weight loss; ongoing, now r/t to taste changes  Goal:   PO intake to meet >/= 90% of their estimated nutrition needs; progressing   Monitor:   Total protein/energy intake, labs, weight, GI profile  Assessment:   12/30: -Pt tolerating Glucerna 1.2 at goal rate of 65 ml/hr Jtube with minimal residual -Received consult to modify tube feeding regimen d/t ongoing loose stools. Per discussion with RN, pt had been experiencing 10-12 loose stools daily; however this was improving and patient had only experienced 3 episodes this morning (~7am-11pm) -Pt with C.diff, and receiving antibiotics; both of which likely contributing to loose stools -Diet advanced to Clear liquids, with plan to advance to full liquids tomorrow as tolerated, and eventual wean of TF -RN reported pt not candidate for anti-diarrheals d/t C.diff -Discussed concern with MD; who was in agreement to look into possible anti-diarrheals as warranted  12/30 ADDENDUM: -Initiate Vital AF 1.2 @ NEW goal rate of 55 ml/hr via Jtube Tube  feeding regimen provides 1584 kcal (88% of needs), 100 grams of protein, and 1070 ml of H2O.  Vital AF 1.2 contains scFOS; a prebiotic fiber that promotes growth of beneficial bacteria of the colon; also may be beneficial for loose stools d/t lower volume goal rate  1/04: -Vital AF 1.2 infusing at 40 ml/hr, providing approximately 1152 kcal, 72 gram protein -Diet advanced to Soft on 1/02. Pt reported consuming 50% of meals; and experiencing loss of taste that contribute to sub-optimal intake -Reviewed nutrition therapy to assist with taste changes (mouth rinses, use of lemon drops or mints) however pt currently refusing oral care per wife as it induces nausea -Consider use of antiemetic or appetite stimulant to assist with PO intake -Pt drinks Boost once daily at home, will discontinue tube feeding and encourage oral care and Boost supplement BID  1/05: -Vital AF 1.2 infusing at 20 ml/hr; decided to wean vs discontinue d/t pt's decrease in PO intake -Pt with improvement of loose stools, and tolerating tube feeding w/out residuals -Continues with poor PO intake taste changes. Provided additional taste change nutrition education and offered use of zinc, and oral care; however pt continues to refuse trying suggested therapies -Meal completion 10%, drinking 100% of Boost BID. Denied current nausea. -Received consult from surgery to modify to nocturnal tube feeding to supplement PO intake; will modify and assess PO intake  1/07: -Pt tolerating Vital AF 1.2 at 60 ml/hr, infusing from 8pm-8am -RN noted pt with improvement in formed stools -Appetite fair; consuming on average 50% of meals; some  of which are being brought from home -Drinking Boost Plus once daily -Due to malnourished condition, severe weight loss and varying appetite; pt would still continue to benefit from enteral nutrition supplemental feedings -Will modify formula to assist in D/C needs. [facility] RD recommends Promote as  pt private paying. Will modify TF goal rate to meet similar needs provided from Vital AF 1.2 -CBG elevated  Height: Ht Readings from Last 1 Encounters:  11/29/14 5\' 8"  (1.727 m)    Weight Status:   Wt Readings from Last 1 Encounters:  12/07/14 157 lb 3 oz (71.3 kg)    Re-estimated needs:  Kcal: 1800-2000 Protein: 85-100 gram Fluid: >/=2100 ml daily  Skin: closed incision on abdomen  Diet Order: DIET SOFT   Intake/Output Summary (Last 24 hours) at 12/21/14 0946 Last data filed at 12/21/14 0900  Gross per 24 hour  Intake 833.34 ml  Output    300 ml  Net 533.34 ml    Last BM: 1/06   Labs:   Recent Labs Lab 12/18/14 1630 12/21/14 0413  NA 136 136  K 4.0 3.8  CL 104 101  CO2 28 28  BUN 11 12  CREATININE 0.69 0.76  CALCIUM 8.3* 8.3*  GLUCOSE 174* 239*    CBG (last 3)   Recent Labs  12/20/14 2323 12/21/14 0433 12/21/14 0730  GLUCAP 188* 228* 205*    Scheduled Meds: . bismuth subsalicylate  30 mL Per Tube BID  . chlorhexidine  15 mL Mouth Rinse BID  . ciprofloxacin  500 mg Oral BID  . feeding supplement (VITAL AF 1.2 CAL)  1,000 mL Per Tube Q24H  . insulin aspart  0-15 Units Subcutaneous 6 times per day  . lactose free nutrition  237 mL Oral BID BM  . multivitamins with iron  1 tablet Oral QPC supper  . pantoprazole  40 mg Oral BID  . potassium chloride  40 mEq Per Tube TID  . saccharomyces boulardii  250 mg Oral BID  . sodium chloride  10-40 mL Intracatheter Q12H  . sodium chloride  3 mL Intravenous Q12H    Continuous Infusions: . sodium chloride 10 mL/hr at 12/09/14 2055  . sodium chloride 10 mL/hr at 12/17/14 2111    Atlee Abide Laurel [place] Clinical Dietitian [phone]

## 2014-12-21 NOTE — Progress Notes (Signed)
Physical Therapy Treatment Patient Details Name: Daryl Vega MRN: [id number] DOB: 1946-04-21 Today's Date: 12/21/2014    History of Present Illness 69 y/o male admitted on 12/16 with a GI bleed.  Underwent endoscopy on 12/17 and was found to have a bleeding duodenal ulcer which rapidly progressed and he was taken to the OR on 12/17 emergently. There, he had an emergency ex laparotomy, oversewing of a posterior bleeding duodenal ulcer, pyloroplasty, cholecystectomy, repair of R hepatic artery, repair of cholecystoduodenal fistula and placement of a duodenostomy tube and feeding J-tube.      PT Comments    Pt with dizzy/buckling LE episode with ambulation today, RN present and vitals recorded.  Recommend 24/7 assist if home as pt presents as high fall risk, if not available may need SNF.  Follow Up Recommendations  Home health PT;Supervision/Assistance - 24 hour     Equipment Recommendations  Rolling walker with 5" wheels    Recommendations for Other Services       Precautions / Restrictions Precautions Precautions: Fall Precaution Comments: Multiple drains, tubes and catheters    Mobility  Bed Mobility Overal bed mobility: Modified Independent             General bed mobility comments: with rail  Transfers Overall transfer level: Needs assistance Equipment used: Rolling walker (2 wheeled) Transfers: Sit to/from Stand Sit to Stand: Min assist         General transfer comment: cues for hand placement,  assist for lowering to chair when dizzy and balance  Ambulation/Gait Ambulation/Gait assistance: Min assist;Mod assist Ambulation Distance (Feet): 240 Feet Assistive device: Rolling walker (2 wheeled) Gait Pattern/deviations: Step-through pattern Gait velocity: decr   General Gait Details: unsteady gait requiring occasional assist, pt with LE buckling, pale, and states dizzy so recliner brought behind pt to sit, vitals taken and RN recorded, pt reports his  vision went Medical sales representative [place] (Stroke Patients Only)       Balance                                    Cognition Arousal/Alertness: Awake/alert Behavior During Therapy: WFL for tasks assessed/performed Overall Cognitive Status: Within Functional Limits for tasks assessed                      Exercises      General Comments        Pertinent Vitals/Pain Pain Assessment: No/denies pain    Home Living                      Prior Function            PT Goals (current goals can now be found in the care plan section) Progress towards PT goals: Progressing toward goals    Frequency  Min 3X/week    PT Plan Current plan remains appropriate    Co-evaluation             End of Session   Activity Tolerance: Other (comment) (dizziness) Patient left: with call bell/phone within reach;in bed;with bed alarm set     Time: 4098-1191 PT Time Calculation (min) (ACUTE ONLY): 25 min  Charges:  $Gait Training: 8-22 mins $Therapeutic Activity: 8-22 mins  G Codes:      Daryl Vega,Daryl Vega 19-Jan-2015, 12:19 PM Daryl Vega, PT, DPT 01/19/2015 Pager: [phone]

## 2014-12-21 NOTE — Progress Notes (Signed)
21 Days Post-Op  Subjective: He says he had a terrible night.  The only thing that doesn't hurt is his feet.   He is still taking MS.   Objective: Vital signs in last 24 hours: Temp:  [98 F (36.7 C)-100 F (37.8 C)] 100 F (37.8 C) (01/07 0435) Pulse Rate:  [105-113] 113 (01/07 0435) Resp:  [18] 18 (01/07 0435) BP: (97-105)/(62-67) 104/64 mmHg (01/07 0435) SpO2:  [93 %-95 %] 93 % (01/07 0435) Last BM Date: 12/20/14 240 PO recorded,  3 stools Afebrile, Tachycardic Labs OK  Intake/Output from previous day: 01/06 0701 - 01/07 0700 In: 833.3 [P.O.:240; NG/GT:593.3] Out: -  Intake/Output this shift: Total I/O In: -  Out: 300 [Urine:300]  General appearance: alert, cooperative and no distress GI: soft sore, open wound is still purulent, on the dressing, but the wound is clean.    Lab Results:   Recent Labs  12/18/14 1630 12/21/14 0413  WBC 6.6 7.5  HGB 9.8* 9.7*  HCT 32.3* 31.6*  PLT 248 257    BMET  Recent Labs  12/18/14 1630 12/21/14 0413  NA 136 136  K 4.0 3.8  CL 104 101  CO2 28 28  GLUCOSE 174* 239*  BUN 11 12  CREATININE 0.69 0.76  CALCIUM 8.3* 8.3*   PT/INR No results for input(s): LABPROT, INR in the last 72 hours.   Recent Labs Lab 12/18/14 1630  AST 22  ALT 24  ALKPHOS 310*  BILITOT 0.5  PROT 6.2  ALBUMIN 2.3*     Lipase     Component Value Date/Time   LIPASE 41 11/29/2014 0644     Studies/Results: No results found.  Medications: . bismuth subsalicylate  30 mL Per Tube BID  . chlorhexidine  15 mL Mouth Rinse BID  . ciprofloxacin  500 mg Oral BID  . feeding supplement (VITAL AF 1.2 CAL)  1,000 mL Per Tube Q24H  . insulin aspart  0-15 Units Subcutaneous 6 times per day  . lactose free nutrition  237 mL Oral BID BM  . multivitamins with iron  1 tablet Oral QPC supper  . pantoprazole  40 mg Oral BID  . potassium chloride  40 mEq Per Tube TID  . saccharomyces boulardii  250 mg Oral BID  . sodium chloride  10-40 mL  Intracatheter Q12H  . sodium chloride  3 mL Intravenous Q12H    Assessment/Plan 1. 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 Ex Lap, oversewing of posterior bleeding DU, Heineke Mikulicz pyloroplasty, cholecystectomy, repair of right hepatic artery, repair of cholecystoduodenal fistula and placement of duodenostomy tube, feeding jejunostomy - 11/30/14 - Dr. Jackolyn Confer 2. C diff colitis - He has had 5 days of vancomycin per Feeding tube 3. Malnutrition on tube feeding thru J tube Prealbumin 14.3 on 12/12/14 4. Non small cell lung carcinoma, Stage IIIB (T3, N3, M0) undergoing chemotherapy and radiation therapy. (07/2014) 5. Anemia 6. AODM 7. Hypolakemia, Resolved. 8. Probable UTI KLEBSIELLA OXYTOCA >100K Being treated 9. Failure to thrive/deconditioning 10 SCD's   Plan:  From our standpoint he can go.  He has pain all over including his abdomen.  He says the only thing that doesn't hurt is his feet.  i think if this is his biggest issue at this point.    LOS: 22 days    JENNINGS,WILLARD 12/21/2014

## 2014-12-21 NOTE — Progress Notes (Signed)
Reviewed discharge instructions with patient and patient's wife. Educated patient on dressing changes and how to clean sites.  Wife verbalized understanding.  Dressing supplies sent home with patient. Prescriptions given to wife and medication list reviewed in detail. [facility] is set up for patient at discharge. Wife stated she has already spoken with [facility] about timing to come to the house today.  Patient ready for discharge.

## 2014-12-26 ENCOUNTER — Ambulatory Visit: Payer: BC Managed Care – PPO

## 2014-12-27 ENCOUNTER — Encounter: Payer: Self-pay | Admitting: Gastroenterology

## 2015-01-04 ENCOUNTER — Telehealth: Payer: Self-pay | Admitting: Medical Oncology

## 2015-01-04 ENCOUNTER — Telehealth: Payer: Self-pay | Admitting: *Deleted

## 2015-01-04 NOTE — Telephone Encounter (Signed)
Received call from [facility] wanting to talk about pt.  Spoke with Leafy Ro and was informed that pt was prescribed by her office  Oxycodone 5 mg  1-2 tabs every 8 hours PRN for pain.  Pt stated he did not get relief from pain at all.   Mandy noted that pt was given Morphine in the past which pt had relief from pain. Leafy Ro would like to know if Dr. Julien Nordmann would take care of pain management for pt .  Message sent to Dr. Julien Nordmann and Shauna Hugh, desk nurse for review. Mandy's  Phone    [phone].

## 2015-01-04 NOTE — Telephone Encounter (Signed)
Triage asking me to f/u with [facility] re pain management call from pts  PCP who needs help managing pts pain. Daryl Vega discussed pain managemnt with pts PCP and requested his PCP refer him to pain clinic.

## 2015-01-06 ENCOUNTER — Telehealth: Payer: Self-pay | Admitting: *Deleted

## 2015-01-06 ENCOUNTER — Emergency Department (HOSPITAL_COMMUNITY): Payer: Medicare Other

## 2015-01-06 ENCOUNTER — Inpatient Hospital Stay (HOSPITAL_COMMUNITY)
Admission: EM | Admit: 2015-01-06 | Discharge: 2015-01-11 | DRG: 175 | Disposition: A | Discharge status: Home Health | Payer: Medicare Other | Attending: Internal Medicine | Admitting: Internal Medicine

## 2015-01-06 ENCOUNTER — Other Ambulatory Visit: Payer: Self-pay | Admitting: *Deleted

## 2015-01-06 ENCOUNTER — Encounter (HOSPITAL_COMMUNITY): Payer: Self-pay | Admitting: Emergency Medicine

## 2015-01-06 DIAGNOSIS — Z85820 Personal history of malignant melanoma of skin: Secondary | ICD-10-CM

## 2015-01-06 DIAGNOSIS — E43 Unspecified severe protein-calorie malnutrition: Secondary | ICD-10-CM | POA: Diagnosis present

## 2015-01-06 DIAGNOSIS — C3490 Malignant neoplasm of unspecified part of unspecified bronchus or lung: Secondary | ICD-10-CM | POA: Diagnosis present

## 2015-01-06 DIAGNOSIS — D649 Anemia, unspecified: Secondary | ICD-10-CM | POA: Diagnosis present

## 2015-01-06 DIAGNOSIS — I2699 Other pulmonary embolism without acute cor pulmonale: Secondary | ICD-10-CM | POA: Diagnosis present

## 2015-01-06 DIAGNOSIS — E119 Type 2 diabetes mellitus without complications: Secondary | ICD-10-CM | POA: Diagnosis present

## 2015-01-06 DIAGNOSIS — J449 Chronic obstructive pulmonary disease, unspecified: Secondary | ICD-10-CM | POA: Diagnosis present

## 2015-01-06 DIAGNOSIS — D6481 Anemia due to antineoplastic chemotherapy: Secondary | ICD-10-CM | POA: Diagnosis present

## 2015-01-06 DIAGNOSIS — E785 Hyperlipidemia, unspecified: Secondary | ICD-10-CM | POA: Diagnosis present

## 2015-01-06 DIAGNOSIS — Z923 Personal history of irradiation: Secondary | ICD-10-CM

## 2015-01-06 DIAGNOSIS — C3491 Malignant neoplasm of unspecified part of right bronchus or lung: Secondary | ICD-10-CM | POA: Diagnosis present

## 2015-01-06 DIAGNOSIS — D638 Anemia in other chronic diseases classified elsewhere: Secondary | ICD-10-CM | POA: Diagnosis present

## 2015-01-06 DIAGNOSIS — Z66 Do not resuscitate: Secondary | ICD-10-CM | POA: Diagnosis present

## 2015-01-06 DIAGNOSIS — Z8711 Personal history of peptic ulcer disease: Secondary | ICD-10-CM

## 2015-01-06 DIAGNOSIS — E780 Pure hypercholesterolemia: Secondary | ICD-10-CM | POA: Diagnosis present

## 2015-01-06 DIAGNOSIS — K823 Fistula of gallbladder: Secondary | ICD-10-CM | POA: Diagnosis present

## 2015-01-06 DIAGNOSIS — Y92009 Unspecified place in unspecified non-institutional (private) residence as the place of occurrence of the external cause: Secondary | ICD-10-CM | POA: Diagnosis not present

## 2015-01-06 DIAGNOSIS — W19XXXA Unspecified fall, initial encounter: Secondary | ICD-10-CM | POA: Diagnosis present

## 2015-01-06 DIAGNOSIS — R0602 Shortness of breath: Secondary | ICD-10-CM | POA: Diagnosis present

## 2015-01-06 DIAGNOSIS — Z6821 Body mass index (BMI) 21.0-21.9, adult: Secondary | ICD-10-CM

## 2015-01-06 DIAGNOSIS — Z9049 Acquired absence of other specified parts of digestive tract: Secondary | ICD-10-CM | POA: Diagnosis present

## 2015-01-06 DIAGNOSIS — Z9221 Personal history of antineoplastic chemotherapy: Secondary | ICD-10-CM | POA: Diagnosis not present

## 2015-01-06 DIAGNOSIS — J309 Allergic rhinitis, unspecified: Secondary | ICD-10-CM | POA: Diagnosis present

## 2015-01-06 DIAGNOSIS — Z87891 Personal history of nicotine dependence: Secondary | ICD-10-CM | POA: Diagnosis not present

## 2015-01-06 DIAGNOSIS — K264 Chronic or unspecified duodenal ulcer with hemorrhage: Secondary | ICD-10-CM | POA: Diagnosis present

## 2015-01-06 DIAGNOSIS — Z79899 Other long term (current) drug therapy: Secondary | ICD-10-CM

## 2015-01-06 DIAGNOSIS — T451X5A Adverse effect of antineoplastic and immunosuppressive drugs, initial encounter: Secondary | ICD-10-CM

## 2015-01-06 LAB — MAGNESIUM: Magnesium: 1.4 mg/dL — ABNORMAL LOW (ref 1.5–2.5)

## 2015-01-06 LAB — CBC WITH DIFFERENTIAL/PLATELET
BASOS ABS: 0 10*3/uL (ref 0.0–0.1)
BASOS PCT: 0 % (ref 0–1)
EOS ABS: 0 10*3/uL (ref 0.0–0.7)
Eosinophils Relative: 0 % (ref 0–5)
HEMATOCRIT: 31.6 % — AB (ref 39.0–52.0)
HEMOGLOBIN: 10.1 g/dL — AB (ref 13.0–17.0)
LYMPHS ABS: 1.6 10*3/uL (ref 0.7–4.0)
LYMPHS PCT: 12 % (ref 12–46)
MCH: 27.7 pg (ref 26.0–34.0)
MCHC: 32 g/dL (ref 30.0–36.0)
MCV: 86.8 fL (ref 78.0–100.0)
MONO ABS: 1.8 10*3/uL — AB (ref 0.1–1.0)
Monocytes Relative: 15 % — ABNORMAL HIGH (ref 3–12)
Neutro Abs: 9.1 10*3/uL — ABNORMAL HIGH (ref 1.7–7.7)
Neutrophils Relative %: 73 % (ref 43–77)
Platelets: 224 10*3/uL (ref 150–400)
RBC: 3.64 MIL/uL — AB (ref 4.22–5.81)
RDW: 16.5 % — ABNORMAL HIGH (ref 11.5–15.5)
WBC: 12.5 10*3/uL — ABNORMAL HIGH (ref 4.0–10.5)

## 2015-01-06 LAB — COMPREHENSIVE METABOLIC PANEL
ALBUMIN: 3 g/dL — AB (ref 3.5–5.2)
ALK PHOS: 173 U/L — AB (ref 39–117)
ALT: 19 U/L (ref 0–53)
AST: 22 U/L (ref 0–37)
Anion gap: 10 (ref 5–15)
BUN: 18 mg/dL (ref 6–23)
CO2: 24 mmol/L (ref 19–32)
Calcium: 8.7 mg/dL (ref 8.4–10.5)
Chloride: 98 mmol/L (ref 96–112)
Creatinine, Ser: 0.7 mg/dL (ref 0.50–1.35)
GLUCOSE: 113 mg/dL — AB (ref 70–99)
POTASSIUM: 3.6 mmol/L (ref 3.5–5.1)
SODIUM: 132 mmol/L — AB (ref 135–145)
Total Bilirubin: 1.3 mg/dL — ABNORMAL HIGH (ref 0.3–1.2)
Total Protein: 6.9 g/dL (ref 6.0–8.3)

## 2015-01-06 LAB — GLUCOSE, CAPILLARY: Glucose-Capillary: 121 mg/dL — ABNORMAL HIGH (ref 70–99)

## 2015-01-06 LAB — HEPATIC FUNCTION PANEL
ALK PHOS: 177 U/L — AB (ref 39–117)
ALT: 20 U/L (ref 0–53)
AST: 21 U/L (ref 0–37)
Albumin: 2.8 g/dL — ABNORMAL LOW (ref 3.5–5.2)
BILIRUBIN INDIRECT: 0.6 mg/dL (ref 0.3–0.9)
Bilirubin, Direct: 0.6 mg/dL — ABNORMAL HIGH (ref 0.0–0.5)
Total Bilirubin: 1.2 mg/dL (ref 0.3–1.2)
Total Protein: 6.9 g/dL (ref 6.0–8.3)

## 2015-01-06 LAB — I-STAT CG4 LACTIC ACID, ED: LACTIC ACID, VENOUS: 1.22 mmol/L (ref 0.5–2.0)

## 2015-01-06 LAB — PHOSPHORUS: Phosphorus: 3.9 mg/dL (ref 2.3–4.6)

## 2015-01-06 LAB — PROTIME-INR
INR: 1.26 (ref 0.00–1.49)
PROTHROMBIN TIME: 15.9 s — AB (ref 11.6–15.2)

## 2015-01-06 LAB — APTT: APTT: 43 s — AB (ref 24–37)

## 2015-01-06 MED ORDER — HEPARIN BOLUS VIA INFUSION
2000.0000 [IU] | Freq: Once | INTRAVENOUS | Status: AC
  Administered 2015-01-06: 2000 [IU] via INTRAVENOUS
  Filled 2015-01-06: qty 2000

## 2015-01-06 MED ORDER — PROMOTE PO LIQD
1000.0000 mL | Freq: Every day | ORAL | Status: DC
  Administered 2015-01-07 – 2015-01-10 (×4): 1000 mL
  Filled 2015-01-06 (×6): qty 1000

## 2015-01-06 MED ORDER — IOHEXOL 350 MG/ML SOLN
100.0000 mL | Freq: Once | INTRAVENOUS | Status: AC | PRN
  Administered 2015-01-06: 100 mL via INTRAVENOUS

## 2015-01-06 MED ORDER — ACETAMINOPHEN 650 MG RE SUPP
650.0000 mg | Freq: Four times a day (QID) | RECTAL | Status: DC | PRN
Start: 2015-01-06 — End: 2015-01-11

## 2015-01-06 MED ORDER — POTASSIUM CHLORIDE CRYS ER 20 MEQ PO TBCR
20.0000 meq | EXTENDED_RELEASE_TABLET | Freq: Every day | ORAL | Status: DC
  Administered 2015-01-06 – 2015-01-11 (×6): 20 meq via ORAL
  Filled 2015-01-06 (×6): qty 1

## 2015-01-06 MED ORDER — HYDROMORPHONE HCL 1 MG/ML IJ SOLN
1.0000 mg | Freq: Once | INTRAMUSCULAR | Status: AC
  Administered 2015-01-06: 1 mg via INTRAVENOUS
  Filled 2015-01-06: qty 1

## 2015-01-06 MED ORDER — ONDANSETRON HCL 4 MG/2ML IJ SOLN
4.0000 mg | Freq: Four times a day (QID) | INTRAMUSCULAR | Status: DC | PRN
  Administered 2015-01-06 – 2015-01-11 (×6): 4 mg via INTRAVENOUS
  Filled 2015-01-06 (×6): qty 2

## 2015-01-06 MED ORDER — ENSURE CLINICAL ST REVIGOR PO LIQD: 237.0000 mL | Freq: Three times a day (TID) | ORAL | Status: DC

## 2015-01-06 MED ORDER — ACETAMINOPHEN 325 MG PO TABS
650.0000 mg | ORAL_TABLET | Freq: Once | ORAL | Status: AC
  Administered 2015-01-06: 650 mg via ORAL
  Filled 2015-01-06: qty 2

## 2015-01-06 MED ORDER — SACCHAROMYCES BOULARDII 250 MG PO CAPS
250.0000 mg | ORAL_CAPSULE | Freq: Two times a day (BID) | ORAL | Status: DC
  Administered 2015-01-07 – 2015-01-11 (×9): 250 mg via ORAL
  Filled 2015-01-06 (×10): qty 1

## 2015-01-06 MED ORDER — TAB-A-VITE/IRON PO TABS
1.0000 | ORAL_TABLET | Freq: Every day | ORAL | Status: DC
  Administered 2015-01-06 – 2015-01-10 (×5): 1 via ORAL
  Filled 2015-01-06 (×6): qty 1

## 2015-01-06 MED ORDER — ENSURE COMPLETE PO LIQD
237.0000 mL | Freq: Three times a day (TID) | ORAL | Status: DC
  Administered 2015-01-08 – 2015-01-10 (×8): 237 mL via ORAL

## 2015-01-06 MED ORDER — SODIUM CHLORIDE 0.9 % IV BOLUS (SEPSIS)
500.0000 mL | Freq: Once | INTRAVENOUS | Status: AC
  Administered 2015-01-06: 500 mL via INTRAVENOUS

## 2015-01-06 MED ORDER — BUDESONIDE 0.25 MG/2ML IN SUSP
0.2500 mg | Freq: Two times a day (BID) | RESPIRATORY_TRACT | Status: DC
  Administered 2015-01-06 – 2015-01-11 (×9): 0.25 mg via RESPIRATORY_TRACT
  Filled 2015-01-06 (×10): qty 2

## 2015-01-06 MED ORDER — ATORVASTATIN CALCIUM 80 MG PO TABS
80.0000 mg | ORAL_TABLET | Freq: Every day | ORAL | Status: DC
  Administered 2015-01-06 – 2015-01-11 (×6): 80 mg via ORAL
  Filled 2015-01-06 (×6): qty 1

## 2015-01-06 MED ORDER — ACETAMINOPHEN 325 MG PO TABS: 650.0000 mg | ORAL_TABLET | Freq: Four times a day (QID) | ORAL | Status: DC | PRN

## 2015-01-06 MED ORDER — HEPARIN (PORCINE) IN NACL 100-0.45 UNIT/ML-% IJ SOLN
1200.0000 [IU]/h | INTRAMUSCULAR | Status: DC
  Administered 2015-01-06: 1200 [IU]/h via INTRAVENOUS
  Filled 2015-01-06: qty 250

## 2015-01-06 MED ORDER — INSULIN ASPART 100 UNIT/ML ~~LOC~~ SOLN
0.0000 [IU] | Freq: Three times a day (TID) | SUBCUTANEOUS | Status: DC
  Administered 2015-01-08: 2 [IU] via SUBCUTANEOUS
  Administered 2015-01-08 – 2015-01-09 (×2): 1 [IU] via SUBCUTANEOUS
  Administered 2015-01-09: 2 [IU] via SUBCUTANEOUS
  Administered 2015-01-09: 3 [IU] via SUBCUTANEOUS
  Administered 2015-01-10: 2 [IU] via SUBCUTANEOUS
  Administered 2015-01-10 (×2): 1 [IU] via SUBCUTANEOUS
  Administered 2015-01-11: 3 [IU] via SUBCUTANEOUS

## 2015-01-06 MED ORDER — BISMUTH SUBSALICYLATE 262 MG/15ML PO SUSP
30.0000 mL | ORAL | Status: DC | PRN
  Filled 2015-01-06: qty 236

## 2015-01-06 MED ORDER — TEMAZEPAM 15 MG PO CAPS
15.0000 mg | ORAL_CAPSULE | Freq: Every evening | ORAL | Status: DC | PRN
  Administered 2015-01-10: 15 mg via ORAL
  Filled 2015-01-06: qty 1

## 2015-01-06 MED ORDER — IPRATROPIUM-ALBUTEROL 0.5-2.5 (3) MG/3ML IN SOLN
3.0000 mL | Freq: Once | RESPIRATORY_TRACT | Status: AC
  Administered 2015-01-06: 3 mL via RESPIRATORY_TRACT
  Filled 2015-01-06: qty 3

## 2015-01-06 MED ORDER — LORAZEPAM 0.5 MG PO TABS: 0.5000 mg | ORAL_TABLET | Freq: Two times a day (BID) | ORAL | Status: DC | PRN

## 2015-01-06 MED ORDER — ONDANSETRON HCL 4 MG PO TABS
4.0000 mg | ORAL_TABLET | Freq: Four times a day (QID) | ORAL | Status: DC | PRN
  Administered 2015-01-11: 4 mg via ORAL
  Filled 2015-01-06: qty 1

## 2015-01-06 MED ORDER — PANTOPRAZOLE SODIUM 40 MG PO TBEC: 40.0000 mg | DELAYED_RELEASE_TABLET | Freq: Two times a day (BID) | ORAL | Status: DC

## 2015-01-06 MED ORDER — GLIMEPIRIDE 2 MG PO TABS
2.0000 mg | ORAL_TABLET | Freq: Every day | ORAL | Status: DC
  Administered 2015-01-07 – 2015-01-11 (×5): 2 mg via ORAL
  Filled 2015-01-06 (×5): qty 1

## 2015-01-06 MED ORDER — CALCIUM CITRATE 950 (200 CA) MG PO TABS
1.0000 | ORAL_TABLET | Freq: Every day | ORAL | Status: DC
  Administered 2015-01-07 – 2015-01-11 (×5): 200 mg via ORAL
  Filled 2015-01-06 (×6): qty 1

## 2015-01-06 MED ORDER — INSULIN GLARGINE 100 UNIT/ML ~~LOC~~ SOLN
10.0000 [IU] | Freq: Every day | SUBCUTANEOUS | Status: DC
  Administered 2015-01-07 – 2015-01-10 (×2): 10 [IU] via SUBCUTANEOUS
  Filled 2015-01-06 (×6): qty 0.1

## 2015-01-06 MED ORDER — SODIUM CHLORIDE 0.9 % IV SOLN
INTRAVENOUS | Status: AC
  Administered 2015-01-06: 19:00:00 via INTRAVENOUS

## 2015-01-06 MED ORDER — HYDROMORPHONE HCL 1 MG/ML IJ SOLN
1.0000 mg | INTRAMUSCULAR | Status: DC | PRN
  Administered 2015-01-06 – 2015-01-11 (×8): 1 mg via INTRAVENOUS
  Filled 2015-01-06 (×9): qty 1

## 2015-01-06 MED ORDER — OXYCODONE HCL 5 MG PO TABS
10.0000 mg | ORAL_TABLET | ORAL | Status: DC | PRN
  Administered 2015-01-07 – 2015-01-10 (×8): 10 mg via ORAL
  Filled 2015-01-06 (×9): qty 2

## 2015-01-06 MED ORDER — SODIUM CHLORIDE 0.9 % IV BOLUS (SEPSIS)
1000.0000 mL | Freq: Once | INTRAVENOUS | Status: AC
  Administered 2015-01-06: 1000 mL via INTRAVENOUS

## 2015-01-06 NOTE — Progress Notes (Signed)
Patient had a positive C-diff result on Dec 21st. Patient finished out course of antibiotics. Patient currently is having formed daily stools per wife. Will place on contact for C-diff for until verify with infection prevention.

## 2015-01-06 NOTE — ED Notes (Signed)
Hospitalist at bedside 

## 2015-01-06 NOTE — ED Notes (Signed)
He states that as he was standing up to get off his commode, his "hand slipped off my walker and I fell". He has sm. Abrasion at pate area, and no other c/o injury or pain.  He is h.o.h. And is oriented x 4 with clear speech.

## 2015-01-06 NOTE — ED Notes (Signed)
Pt sts that his walker tipped over when he was trying to get off the bedside toilet. Pt has small abrasion to top of head. Pt sts that he has bilateral aching wrists from chemo. Pt denies LOC with the fall. Pt is A&O and in NAD

## 2015-01-06 NOTE — ED Provider Notes (Signed)
CSN: [id number]     Arrival date & time 01/06/15  1145 History   First MD Initiated Contact with Patient 01/06/15 1154     Chief Complaint  Patient presents with  . Fall     (Consider location/radiation/quality/duration/timing/severity/associated sxs/prior Treatment) HPI 69 year old male with a history of lung cancer currently on chemotherapy presents with a fall this morning while in the bathroom. The patient states that he was on the commode and when he stood up his walker gave out and he fell to the right and hit his head. He has a small abrasion where he hit his head. Did not lose consciousness. Minimal to no headache currently. Also hit his right elbow but has normal range of motion and no pain now. EMS noted the patient was short of breath and hypoxic into the high 80s. The patient is not currently on oxygen. The patient has a cough and has had worsening cough. His nebulizer has broken.   Past Medical History  Diagnosis Date  . DM (diabetes mellitus)   . Pneumonia   . Hypercholesteremia   . Melanoma   . Radiation 08/03/14-08/23/14    35 gray to right chest  . Lung cancer   . FH: chemotherapy    Past Surgical History  Procedure Laterality Date  . Video bronchoscopy Bilateral 07/20/2014    Procedure: VIDEO BRONCHOSCOPY WITHOUT FLUORO;  Surgeon: Tanda Rockers, MD;  Location: [facility];  Service: Cardiopulmonary;  Laterality: Bilateral;  . Esophagogastroduodenoscopy N/A 11/30/2014    Procedure: ESOPHAGOGASTRODUODENOSCOPY (EGD);  Surgeon: [name], MD;  Location: [facility];  Service: Endoscopy;  Laterality: N/A;  . Laparotomy N/A 11/30/2014    Procedure: EXPLORATORY LAPAROTOMY, PYLOROPLASTY, OVERSEWING OF POSTERIOR DUODENUM, DUODENOSTOMY, CHOLECYSTECTOMY, JEJEUNOSTOMY;  Surgeon: Jackolyn Confer, MD;  Location: [facility];  Service: General;  Laterality: N/A;   Family History  Problem Relation Age of Onset  . Heart disease Mother   . Cancer Mother     ? type   History    Substance Use Topics  . Smoking status: Former Smoker -- 2.00 packs/day for 50 years    Types: Cigarettes, Cigars    Quit date: 06/12/2014  . Smokeless tobacco: Current User    Types: Chew  . Alcohol Use: No    Review of Systems  Constitutional: Negative for fever.  Respiratory: Positive for cough and shortness of breath.   Cardiovascular: Negative for chest pain.  Gastrointestinal: Negative for vomiting and abdominal pain.  Musculoskeletal: Negative for neck pain.  Neurological: Positive for headaches. Negative for dizziness, syncope, weakness and numbness.  All other systems reviewed and are negative.     Allergies  Review of patient's allergies indicates no known allergies.  Home Medications   Prior to Admission medications   Medication Sig Start Date End Date Taking? Authorizing Provider  atorvastatin (LIPITOR) 80 MG tablet Take 80 mg by mouth daily.   Yes Historical Provider, MD  bismuth subsalicylate (PEPTO BISMOL) 262 MG/15ML suspension Place 30 mLs into feeding tube 2 (two) times daily. Patient taking differently: Place 30 mLs into feeding tube every 4 (four) hours as needed for indigestion.  12/19/14  Yes Nishant Dhungel, MD  budesonide-formoterol (SYMBICORT) 160-4.5 MCG/ACT inhaler Take 2 puffs first thing in am and then another 2 puffs about 12 hours later. Patient taking differently: Inhale 2 puffs into the lungs 2 (two) times daily as needed.  10/17/14  Yes Tanda Rockers, MD  calcium citrate-vitamin D (CITRACAL+D) 315-200 MG-UNIT per tablet Take 1 tablet by  mouth daily.   Yes Historical Provider, MD  Cinnamon 500 MG capsule Take 500 mg by mouth daily.   Yes Historical Provider, MD  Dapagliflozin Propanediol (FARXIGA) 5 MG TABS Take 5 mg by mouth 2 (two) times daily.   Yes Historical Provider, MD  feeding supplement (ENSURE CLINICAL STRENGTH) LIQD Take 237 mLs by mouth 3 (three) times daily with meals.   Yes Historical Provider, MD  glimepiride (AMARYL) 4 MG tablet  Take 4 mg by mouth 2 (two) times daily. Take before meals   Yes Historical Provider, MD  hydrocortisone-pramoxine [facility]) 2.5-1 % rectal cream Place 1 application rectally every 6 (six) hours as needed for hemorrhoids or itching. 12/19/14  Yes Nishant Dhungel, MD  LORazepam (ATIVAN) 0.5 MG tablet Take 1 table by mouth every 12 hours as needed for anxiety 12/19/14  Yes Nishant Dhungel, MD  metFORMIN (GLUCOPHAGE) 500 MG tablet Take 500 mg by mouth 2 (two) times daily with a meal.   Yes Historical Provider, MD  Multiple Vitamins-Iron (MULTIVITAMINS WITH IRON) TABS tablet Take 1 tablet by mouth daily after supper. 12/19/14  Yes Nishant Dhungel, MD  Nutritional Supplements (PROMOTE PO) Give 70 mL/hr by tube at bedtime. Runs from  10 pm to 8 am   Yes Historical Provider, MD  oxyCODONE (OXY IR/ROXICODONE) 5 MG immediate release tablet Take 2 tablets (10 mg total) by mouth every 4 (four) hours as needed for moderate pain. 12/19/14  Yes Nishant Dhungel, MD  pantoprazole (PROTONIX) 40 MG tablet Take 1 tablet (40 mg total) by mouth 2 (two) times daily. 12/19/14  Yes Nishant Dhungel, MD  potassium chloride SA (K-DUR,KLOR-CON) 20 MEQ tablet Take 2 tablets (40 mEq total) by mouth daily. 12/19/14  Yes Nishant Dhungel, MD  prochlorperazine (COMPAZINE) 10 MG tablet Take 1 tablet (10 mg total) by mouth every 6 (six) hours as needed for nausea or vomiting. 12/19/14  Yes Nishant Dhungel, MD  saccharomyces boulardii (FLORASTOR) 250 MG capsule Take 1 capsule (250 mg total) by mouth 2 (two) times daily. 12/19/14  Yes Nishant Dhungel, MD  lactose free nutrition (BOOST PLUS) LIQD Take 237 mLs by mouth 2 (two) times daily between meals. Patient not taking: Reported on 01/06/2015 12/19/14   Nishant Dhungel, MD  potassium chloride SA (K-DUR,KLOR-CON) 20 MEQ tablet Take 1 tablet (20 mEq total) by mouth daily. Patient not taking: Reported on 11/21/2014 10/28/14   Garald Balding, NP  temazepam (RESTORIL) 15 MG capsule Take 1 capsule (15 mg total)  by mouth at bedtime as needed for sleep. 11/01/14   Curt Bears, MD   BP 91/59 mmHg  Pulse 114  Temp(Src) 99 F (37.2 C) (Oral)  Resp 16  Ht 5\' 8"  (1.727 m)  Wt 146 lb (66.225 kg)  BMI 22.20 kg/m2  SpO2 91% Physical Exam  Constitutional: He is oriented to person, place, and time. He appears well-developed and well-nourished.  Chronically ill appearing  HENT:  Head: Normocephalic.    Right Ear: External ear normal.  Left Ear: External ear normal.  Nose: Nose normal.  Eyes: Right eye exhibits no discharge. Left eye exhibits no discharge.  Neck: Neck supple.  Cardiovascular: Regular rhythm, normal heart sounds and intact distal pulses.  Tachycardia present.   Pulmonary/Chest: Effort normal. No accessory muscle usage. Tachypnea noted. No respiratory distress. He has decreased breath sounds. He has wheezes.  Abdominal: Soft. There is no tenderness.  Musculoskeletal: He exhibits no edema.  Neurological: He is alert and oriented to person, place, and time.  Skin:  Skin is warm and dry.  Nursing note and vitals reviewed.   ED Course  Procedures (including critical care time) Labs Review Labs Reviewed  COMPREHENSIVE METABOLIC PANEL - Abnormal; Notable for the following:    Sodium 132 (*)    Glucose, Bld 113 (*)    Albumin 3.0 (*)    Alkaline Phosphatase 173 (*)    Total Bilirubin 1.3 (*)    All other components within normal limits  CBC WITH DIFFERENTIAL/PLATELET - Abnormal; Notable for the following:    WBC 12.5 (*)    RBC 3.64 (*)    Hemoglobin 10.1 (*)    HCT 31.6 (*)    RDW 16.5 (*)    Neutro Abs 9.1 (*)    Monocytes Relative 15 (*)    Monocytes Absolute 1.8 (*)    All other components within normal limits  PROTIME-INR - Abnormal; Notable for the following:    Prothrombin Time 15.9 (*)    All other components within normal limits  APTT - Abnormal; Notable for the following:    aPTT 43 (*)    All other components within normal limits  CBC - Abnormal; Notable  for the following:    RBC 3.53 (*)    Hemoglobin 9.9 (*)    HCT 31.0 (*)    RDW 16.6 (*)    All other components within normal limits  HEPATIC FUNCTION PANEL - Abnormal; Notable for the following:    Albumin 2.8 (*)    Alkaline Phosphatase 177 (*)    Bilirubin, Direct 0.6 (*)    All other components within normal limits  MAGNESIUM - Abnormal; Notable for the following:    Magnesium 1.4 (*)    All other components within normal limits  BASIC METABOLIC PANEL - Abnormal; Notable for the following:    Glucose, Bld 101 (*)    All other components within normal limits  HEMOGLOBIN A1C - Abnormal; Notable for the following:    Hgb A1c MFr Bld 5.7 (*)    Mean Plasma Glucose 117 (*)    All other components within normal limits  HEPARIN LEVEL (UNFRACTIONATED) - Abnormal; Notable for the following:    Heparin Unfractionated <0.10 (*)    All other components within normal limits  GLUCOSE, CAPILLARY - Abnormal; Notable for the following:    Glucose-Capillary 121 (*)    All other components within normal limits  PHOSPHORUS  TSH  HEPARIN LEVEL (UNFRACTIONATED)  I-STAT CG4 LACTIC ACID, ED    Imaging Review Dg Chest 2 View  01/06/2015   CLINICAL DATA:  Shortness of breath with hypoxia. History of right-sided lung cancer. Initial encounter.  EXAM: CHEST  2 VIEW  COMPARISON:  12/05/2014 and 12/04/2014 radiographs. Chest CT 10/22/2014.  FINDINGS: The PICC and nasogastric tube have been removed. There is interval improved aeration of both lung bases with minimal residual patchy atelectasis or infiltrates. Right upper lobe scarring is stable. There is no significant pleural effusion. The heart size and mediastinal contours are stable.  IMPRESSION: Interval improvement in bibasilar airspace opacities compared with prior examination of 1 month ago. Residual densities may reflect atelectasis or resolving inflammation. No new findings.   Electronically Signed   By: Camie Patience M.D.   On: 01/06/2015 12:52    Ct Head Wo Contrast  01/06/2015   CLINICAL DATA:  Status post fall. History of lung cancer with chemotherapy and radiation therapy. Initial encounter.  EXAM: CT HEAD WITHOUT CONTRAST  TECHNIQUE: Contiguous axial images were obtained from the base  of the skull through the vertex without intravenous contrast.  COMPARISON:  Head CT 10/28/2014.  MRI brain 08/01/2014.  FINDINGS: There is no evidence of acute intracranial hemorrhage, mass lesion, brain edema or extra-axial fluid collection. The ventricles and subarachnoid spaces are appropriately sized for age. There is no CT evidence of acute cortical infarction. Mild intracranial vascular calcifications are noted.  The visualized paranasal sinuses, mastoid air cells and middle ears are clear. The calvarium is intact.  IMPRESSION: Stable normal appearance of the brain. No acute posttraumatic findings or evidence of metastatic disease.   Electronically Signed   By: Camie Patience M.D.   On: 01/06/2015 13:07   Ct Angio Chest Pe W/cm &/or Wo Cm  01/06/2015   CLINICAL DATA:  Hypoxemia. History of lung cancer diagnosed 5 months ago status post chemotherapy and radiation therapy. Initial encounter.  EXAM: CT ANGIOGRAPHY CHEST WITH CONTRAST  TECHNIQUE: Multidetector CT imaging of the chest was performed using the standard protocol during bolus administration of intravenous contrast. Multiplanar CT image reconstructions and MIPs were obtained to evaluate the vascular anatomy.  CONTRAST:  181mL OMNIPAQUE IOHEXOL 350 MG/ML SOLN  COMPARISON:  Radiographs today.  Chest CT 10/22/2014.  FINDINGS: Vascular: The pulmonary arteries are well opacified with contrast. There is definite acute segmental pulmonary embolism within the left lower lobe pulmonary artery. Additional smaller foci of embolic disease are present on the left. No definite right-sided emboli demonstrated. There is no involvement of the central pulmonary arteries or right ventricle. There are no signs of right heart  strain. There is stable relatively mild atherosclerosis of the aorta, great vessels and coronary arteries. The heart size is normal. A small pericardial effusion appears unchanged.  Mediastinum: No discretely enlarged mediastinal or hilar lymph nodes are identified. There is stable soft tissue fullness in the right hilum with associated central airway narrowing attributed to prior radiation therapy. The thyroid gland, trachea and esophagus demonstrate no significant findings.  Lungs/Pleura: Small to moderate right-greater-than-left pleural effusions have enlarged compared with the prior study. Left-greater-than-right basilar airspace opacities have also progressed. There is minimal no opacity anteriorly in the right middle lobe. The additional paramediastinal opacities in the right lung are stable and attributed to radiation therapy. No focal pulmonary nodules identified.  Upper abdomen: Distal esophageal wall thickening appears improved. The visualized liver appears unremarkable. The adrenal glands are not imaged.  Musculoskeletal/Chest wall: No chest wall lesion or acute osseous findings.No worrisome osseous findings.  Review of the MIP images confirms the above findings.  IMPRESSION: 1. Study is positive for acute segmental pulmonary embolism within the left lower lobe. No signs of right heart strain. 2. Interval enlargement of bilateral pleural effusions with worsening bibasilar airspace opacities. The latter could be secondary to atelectasis, inflammation or pulmonary infarction. 3. Otherwise stable right upper lobe radiation changes. No evidence of metastatic disease. 4. Critical Value/emergent results were called by telephone at the time of interpretation on 01/06/2015 at 2:48 pm to Dr. Sherwood Gambler , who verbally acknowledged these results.   Electronically Signed   By: Camie Patience M.D.   On: 01/06/2015 14:48     EKG Interpretation   Date/Time:  Saturday January 06 2015 11:51:17 EST Ventricular  Rate:  122 PR Interval:  161 QRS Duration: 96 QT Interval:  333 QTC Calculation: 474 R Axis:   54 Text Interpretation:  Sinus tachycardia Atrial premature complexes  Abnormal R-wave progression, early transition Borderline repolarization  abnormality Baseline wander in lead(s) II III aVF No significant change  since last tracing Confirmed by Konstantinos Cordoba  MD, [place] [phone]) on 01/07/2015  7:16:19 AM      MDM   Final diagnoses:  Pulmonary embolism    Patient with mechanical fall, no significant injury. Patient has tachycardia and hypoxia, which is new for him despite his lung cancer. Patient's workup shows left sided pulmonary emboli, no obvious infarct or heart strain. WOB improved with nebs. He has no significant head trauma or prior intracranial bleeding to be a contraindication to heparin. Did recently have a duodenal ulcer that was bleeding a couple weeks ago, after discussion with patient and wife about risks/benefits they prefer to start anticoagulation. Admit to hospitalist.    Ephraim Hamburger, MD 01/07/15 [phone]

## 2015-01-06 NOTE — Telephone Encounter (Signed)
Received instructions from Dr. Julien Nordmann from 01/05/15 re: pt needs to have office visit to discuss pain management issue.  Onc Tx schedule  sent to scheduler.

## 2015-01-06 NOTE — Progress Notes (Addendum)
ANTICOAGULATION CONSULT NOTE - Initial Consult  Pharmacy Consult for Heparin Indication: pulmonary embolus  No Known Allergies  Patient Measurements: Weight: 146 lb (66.225 kg) Height: 5'8" Heparin Dosing Weight: 66.2 kg  Vital Signs: Temp: 99.7 F (37.6 C) (01/23 1148) Temp Source: Oral (01/23 1148) BP: 109/76 mmHg (01/23 1200) Pulse Rate: 119 (01/23 1506)  Labs:  Recent Labs  01/06/15 1317  HGB 10.1*  HCT 31.6*  PLT 224  CREATININE 0.70    Estimated Creatinine Clearance: 83.9 mL/min (by C-G formula based on Cr of 0.7).   Medical History: Past Medical History  Diagnosis Date  . DM (diabetes mellitus)   . Pneumonia   . Hypercholesteremia   . Melanoma   . Radiation 08/03/14-08/23/14    35 gray to right chest  . Lung cancer   . FH: chemotherapy    Assessment: 28 y/oM with PMH of lung cancer, DM, hypercholesteremia, anemia, bleeding duodenal ulcer s/p ex lap/oversewing of posterior duodenum 11/30/14 who presents with a fall this morning while in the bathroom. He has a small abrasion where he hit his head. CT of head shows no acute posttraumatic findings or evidence of metastatic disease. CT angio positive for LLL PE. Pharmacy consulted to assist with heparin dosing for this patient.  Today, 01/06/15:  Hgb/Hct 10.1/31.6--low, but stable as compared to previous values  Pltc WNL  Baseline PT/INR, aPTT pending    Goal of Therapy:  Heparin level 0.3-0.7 units/ml Monitor platelets by anticoagulation protocol: Yes   Plan:   Heparin 2000 units IV bolus x 1, then start infusion at 1200 units/hour.  Heparin level 6 hours after initiation.  Daily heparin level and CBC while on heparin infusion.  Monitor closely for signs and symptoms of bleeding given recent bleeding duodenal ulcer, anemia.  F/u plans for long-term anticoagulation.   Lindell Spar, PharmD, BCPS Pager: [phone] 01/06/2015 3:50 PM

## 2015-01-06 NOTE — ED Notes (Signed)
Bed: WA17 Expected date: 01/06/15 Expected time: 11:39 AM Means of arrival: Ambulance Comments: Low 02 sats, lung ca

## 2015-01-06 NOTE — H&P (Signed)
[facility] History and Physical  Daryl Vega QPY:[id number] DOB: February 13, 1946 DOA: 01/06/2015  Referring physician: Dr. Regenia Skeeter PCP: [name], MD   Chief Complaint:SOB, tachcyardia and mild pleuritic chest discomfort  HPI: Daryl Vega is a 69 y.o. male with PMH significant for DM, HLD, mixed type COPD; duodenal bleeding ulcer (status post surgery), protein calorie malnutrition (on enteral feeding) and lung cancer (actively receiving chemo); presented to ED after falling at home. Family reports mild SOB. In ED was found to have low grade temp, hypoxia and tachycardia; patient also endorses mild pleuritic chest discomfort. Radiologic work up demonstrated no acute findings on CT head, improvement in opacity on his CXR and positive CT-angio for left lung PE. Patient placed on oxygen supplementation, started on IV heparin and IVF's. BP and rest of blood-work essentially unremarkable. Patient w/o signs of melena, hematemesis, nausea, vomiting, palpitations, dysuria, hematuria or any other complaints. [facility] called to admit patient for tx of PE.  Patient after IVF's with improvement in his HR and with stable BP. Will admit to telemetry bed.   Review of Systems:  Negative except as mentioned on HPI  Past Medical History  Diagnosis Date  . DM (diabetes mellitus)   . Pneumonia   . Hypercholesteremia   . Melanoma   . Radiation 08/03/14-08/23/14    35 gray to right chest  . Lung cancer   . FH: chemotherapy    Past Surgical History  Procedure Laterality Date  . Video bronchoscopy Bilateral 07/20/2014    Procedure: VIDEO BRONCHOSCOPY WITHOUT FLUORO;  Surgeon: Tanda Rockers, MD;  Location: [facility];  Service: Cardiopulmonary;  Laterality: Bilateral;  . Esophagogastroduodenoscopy N/A 11/30/2014    Procedure: ESOPHAGOGASTRODUODENOSCOPY (EGD);  Surgeon: [name], MD;  Location: [facility];  Service: Endoscopy;  Laterality: N/A;  . Laparotomy N/A 11/30/2014    Procedure:  EXPLORATORY LAPAROTOMY, PYLOROPLASTY, OVERSEWING OF POSTERIOR DUODENUM, DUODENOSTOMY, CHOLECYSTECTOMY, JEJEUNOSTOMY;  Surgeon: Jackolyn Confer, MD;  Location: [facility];  Service: General;  Laterality: N/A;   Social History:  reports that he quit smoking about 6 months ago. His smoking use included Cigarettes and Cigars. He has a 100 pack-year smoking history. His smokeless tobacco use includes Chew. He reports that he does not drink alcohol or use illicit drugs.  No Known Allergies  Family History  Problem Relation Age of Onset  . Heart disease Mother   . Cancer Mother     ? type    Prior to Admission medications   Medication Sig Start Date End Date Taking? Authorizing Provider  atorvastatin (LIPITOR) 80 MG tablet Take 80 mg by mouth daily.   Yes Historical Provider, MD  bismuth subsalicylate (PEPTO BISMOL) 262 MG/15ML suspension Place 30 mLs into feeding tube 2 (two) times daily. Patient taking differently: Place 30 mLs into feeding tube every 4 (four) hours as needed for indigestion.  12/19/14  Yes Nishant Dhungel, MD  budesonide-formoterol (SYMBICORT) 160-4.5 MCG/ACT inhaler Take 2 puffs first thing in am and then another 2 puffs about 12 hours later. Patient taking differently: Inhale 2 puffs into the lungs 2 (two) times daily as needed.  10/17/14  Yes Tanda Rockers, MD  calcium citrate-vitamin D (CITRACAL+D) 315-200 MG-UNIT per tablet Take 1 tablet by mouth daily.   Yes Historical Provider, MD  Cinnamon 500 MG capsule Take 500 mg by mouth daily.   Yes Historical Provider, MD  Dapagliflozin Propanediol (FARXIGA) 5 MG TABS Take 5 mg by mouth 2 (two) times daily.   Yes Historical Provider,  MD  feeding supplement (ENSURE CLINICAL STRENGTH) LIQD Take 237 mLs by mouth 3 (three) times daily with meals.   Yes Historical Provider, MD  glimepiride (AMARYL) 4 MG tablet Take 4 mg by mouth 2 (two) times daily. Take before meals   Yes Historical Provider, MD  hydrocortisone-pramoxine [facility]) 2.5-1  % rectal cream Place 1 application rectally every 6 (six) hours as needed for hemorrhoids or itching. 12/19/14  Yes Nishant Dhungel, MD  LORazepam (ATIVAN) 0.5 MG tablet Take 1 table by mouth every 12 hours as needed for anxiety 12/19/14  Yes Nishant Dhungel, MD  metFORMIN (GLUCOPHAGE) 500 MG tablet Take 500 mg by mouth 2 (two) times daily with a meal.   Yes Historical Provider, MD  Multiple Vitamins-Iron (MULTIVITAMINS WITH IRON) TABS tablet Take 1 tablet by mouth daily after supper. 12/19/14  Yes Nishant Dhungel, MD  Nutritional Supplements (PROMOTE PO) Give 70 mL/hr by tube at bedtime. Runs from  10 pm to 8 am   Yes Historical Provider, MD  oxyCODONE (OXY IR/ROXICODONE) 5 MG immediate release tablet Take 2 tablets (10 mg total) by mouth every 4 (four) hours as needed for moderate pain. 12/19/14  Yes Nishant Dhungel, MD  pantoprazole (PROTONIX) 40 MG tablet Take 1 tablet (40 mg total) by mouth 2 (two) times daily. 12/19/14  Yes Nishant Dhungel, MD  potassium chloride SA (K-DUR,KLOR-CON) 20 MEQ tablet Take 2 tablets (40 mEq total) by mouth daily. 12/19/14  Yes Nishant Dhungel, MD  prochlorperazine (COMPAZINE) 10 MG tablet Take 1 tablet (10 mg total) by mouth every 6 (six) hours as needed for nausea or vomiting. 12/19/14  Yes Nishant Dhungel, MD  saccharomyces boulardii (FLORASTOR) 250 MG capsule Take 1 capsule (250 mg total) by mouth 2 (two) times daily. 12/19/14  Yes Nishant Dhungel, MD  lactose free nutrition (BOOST PLUS) LIQD Take 237 mLs by mouth 2 (two) times daily between meals. Patient not taking: Reported on 01/06/2015 12/19/14   Nishant Dhungel, MD  potassium chloride SA (K-DUR,KLOR-CON) 20 MEQ tablet Take 1 tablet (20 mEq total) by mouth daily. Patient not taking: Reported on 11/21/2014 10/28/14   Garald Balding, NP  temazepam (RESTORIL) 15 MG capsule Take 1 capsule (15 mg total) by mouth at bedtime as needed for sleep. 11/01/14   Curt Bears, MD   Physical Exam: Filed Vitals:   01/06/15 1500 01/06/15  1506 01/06/15 1525 01/06/15 1628  BP:    90/70  Pulse: 118 119  111  Temp:      TempSrc:      Resp: 18 18  18   Weight:   66.225 kg (146 lb)   SpO2: 94% 95%  95%    Wt Readings from Last 3 Encounters:  01/06/15 66.225 kg (146 lb)  12/07/14 71.3 kg (157 lb 3 oz)  11/21/14 69.899 kg (154 lb 1.6 oz)    General:  Thin and ill looking gentleman; complaining of some pleuritic discomfort and with hypoxia on RA. Eyes: PERRL, normal lids, irises & conjunctiva, no icterus ENT: grossly normal hearing, no erythema or exudates inside his mouth; positive mild dryness. Poor dentition. Neck: no LAD, masses or thyromegaly; no JVD Cardiovascular: tachycardia; no rubs or gallops;no Le edema Respiratory: scattered rhonchi; mild tachypnea; no wheezing. No using accessory muscles Abdomen: soft, nd; positive BS. Positive ventral wound healing by second intention; no drainage and no erythema in surrounding skin. Patient drain and enteral tube in place. Skin: no petechiae; wound as described in abdomen section; otherwise neg. Musculoskeletal: grossly normal tone  BUE/BLE Psychiatric: grossly normal mood and affect, speech fluent and appropriate Neurologic: grossly non-focal.          Labs on Admission:  Basic Metabolic Panel:  Recent Labs Lab 01/06/15 1317  NA 132*  K 3.6  CL 98  CO2 24  GLUCOSE 113*  BUN 18  CREATININE 0.70  CALCIUM 8.7   Liver Function Tests:  Recent Labs Lab 01/06/15 1317  AST 22  ALT 19  ALKPHOS 173*  BILITOT 1.3*  PROT 6.9  ALBUMIN 3.0*   CBC:  Recent Labs Lab 01/06/15 1317  WBC 12.5*  NEUTROABS 9.1*  HGB 10.1*  HCT 31.6*  MCV 86.8  PLT 224   Radiological Exams on Admission: Dg Chest 2 View  01/06/2015   CLINICAL DATA:  Shortness of breath with hypoxia. History of right-sided lung cancer. Initial encounter.  EXAM: CHEST  2 VIEW  COMPARISON:  12/05/2014 and 12/04/2014 radiographs. Chest CT 10/22/2014.  FINDINGS: The PICC and nasogastric tube have been  removed. There is interval improved aeration of both lung bases with minimal residual patchy atelectasis or infiltrates. Right upper lobe scarring is stable. There is no significant pleural effusion. The heart size and mediastinal contours are stable.  IMPRESSION: Interval improvement in bibasilar airspace opacities compared with prior examination of 1 month ago. Residual densities may reflect atelectasis or resolving inflammation. No new findings.   Electronically Signed   By: Camie Patience M.D.   On: 01/06/2015 12:52   Ct Head Wo Contrast  01/06/2015   CLINICAL DATA:  Status post fall. History of lung cancer with chemotherapy and radiation therapy. Initial encounter.  EXAM: CT HEAD WITHOUT CONTRAST  TECHNIQUE: Contiguous axial images were obtained from the base of the skull through the vertex without intravenous contrast.  COMPARISON:  Head CT 10/28/2014.  MRI brain 08/01/2014.  FINDINGS: There is no evidence of acute intracranial hemorrhage, mass lesion, brain edema or extra-axial fluid collection. The ventricles and subarachnoid spaces are appropriately sized for age. There is no CT evidence of acute cortical infarction. Mild intracranial vascular calcifications are noted.  The visualized paranasal sinuses, mastoid air cells and middle ears are clear. The calvarium is intact.  IMPRESSION: Stable normal appearance of the brain. No acute posttraumatic findings or evidence of metastatic disease.   Electronically Signed   By: Camie Patience M.D.   On: 01/06/2015 13:07   Ct Angio Chest Pe W/cm &/or Wo Cm  01/06/2015   CLINICAL DATA:  Hypoxemia. History of lung cancer diagnosed 5 months ago status post chemotherapy and radiation therapy. Initial encounter.  EXAM: CT ANGIOGRAPHY CHEST WITH CONTRAST  TECHNIQUE: Multidetector CT imaging of the chest was performed using the standard protocol during bolus administration of intravenous contrast. Multiplanar CT image reconstructions and MIPs were obtained to evaluate the  vascular anatomy.  CONTRAST:  124mL OMNIPAQUE IOHEXOL 350 MG/ML SOLN  COMPARISON:  Radiographs today.  Chest CT 10/22/2014.  FINDINGS: Vascular: The pulmonary arteries are well opacified with contrast. There is definite acute segmental pulmonary embolism within the left lower lobe pulmonary artery. Additional smaller foci of embolic disease are present on the left. No definite right-sided emboli demonstrated. There is no involvement of the central pulmonary arteries or right ventricle. There are no signs of right heart strain. There is stable relatively mild atherosclerosis of the aorta, great vessels and coronary arteries. The heart size is normal. A small pericardial effusion appears unchanged.  Mediastinum: No discretely enlarged mediastinal or hilar lymph nodes are identified. There is stable  soft tissue fullness in the right hilum with associated central airway narrowing attributed to prior radiation therapy. The thyroid gland, trachea and esophagus demonstrate no significant findings.  Lungs/Pleura: Small to moderate right-greater-than-left pleural effusions have enlarged compared with the prior study. Left-greater-than-right basilar airspace opacities have also progressed. There is minimal no opacity anteriorly in the right middle lobe. The additional paramediastinal opacities in the right lung are stable and attributed to radiation therapy. No focal pulmonary nodules identified.  Upper abdomen: Distal esophageal wall thickening appears improved. The visualized liver appears unremarkable. The adrenal glands are not imaged.  Musculoskeletal/Chest wall: No chest wall lesion or acute osseous findings.No worrisome osseous findings.  Review of the MIP images confirms the above findings.  IMPRESSION: 1. Study is positive for acute segmental pulmonary embolism within the left lower lobe. No signs of right heart strain. 2. Interval enlargement of bilateral pleural effusions with worsening bibasilar airspace  opacities. The latter could be secondary to atelectasis, inflammation or pulmonary infarction. 3. Otherwise stable right upper lobe radiation changes. No evidence of metastatic disease. 4. Critical Value/emergent results were called by telephone at the time of interpretation on 01/06/2015 at 2:48 pm to Dr. Sherwood Gambler , who verbally acknowledged these results.   Electronically Signed   By: Camie Patience M.D.   On: 01/06/2015 14:48    EKG:  Sinus tachycardia. No acute ischemic changes.  Assessment/Plan 1-SOB, tachycardia and pleuritic CP due to Pulmonary embolism: most likely associated with increase viscosity from lug cancer. -will admit to telemetry and start patient on IV heparin (dose per pharmacy) -PRN oxygen supplementation -will require discussion with oncology regarding long term anticoagulation agent -close follow up to Hgb and signs of bleeding, as patient recently had surgery and duodenal bleeding ulcer. -PRN analgesics.  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 fornow will monitor and if needed will ask surgery to see patient during admission. -has appointment with Dr. Laney Potash on 1/27  9-Bleeding duodenal ulcer s/p ex lap/oversew 11/30/2014: continue PPI and follow Hgb trend   Dr. Julien Nordmann (aware of admission through [facility])  Code Status: DNR DVT Prophylaxis:started on heparin drip Family Communication: wife at  bedside Disposition Plan: will admit to telemetry, inpatient, LOS > 2 midnights  Time spent: 50 minutes   Barton Dubois [facility] Pager [phone]

## 2015-01-07 LAB — BASIC METABOLIC PANEL
ANION GAP: 9 (ref 5–15)
BUN: 12 mg/dL (ref 6–23)
CHLORIDE: 103 mmol/L (ref 96–112)
CO2: 24 mmol/L (ref 19–32)
Calcium: 8.4 mg/dL (ref 8.4–10.5)
Creatinine, Ser: 0.66 mg/dL (ref 0.50–1.35)
GFR calc Af Amer: >90 mL/min (ref >90)
GFR calc non Af Amer: >90 mL/min (ref >90)
GLUCOSE: 101 mg/dL — AB (ref 70–99)
Potassium: 3.6 mmol/L (ref 3.5–5.1)
Sodium: 136 mmol/L (ref 135–145)

## 2015-01-07 LAB — CBC
HEMATOCRIT: 31 % — AB (ref 39.0–52.0)
HEMOGLOBIN: 9.9 g/dL — AB (ref 13.0–17.0)
MCH: 28 pg (ref 26.0–34.0)
MCHC: 31.9 g/dL (ref 30.0–36.0)
MCV: 87.8 fL (ref 78.0–100.0)
PLATELETS: 220 10*3/uL (ref 150–400)
RBC: 3.53 MIL/uL — ABNORMAL LOW (ref 4.22–5.81)
RDW: 16.6 % — AB (ref 11.5–15.5)
WBC: 9.3 10*3/uL (ref 4.0–10.5)

## 2015-01-07 LAB — HEMOGLOBIN A1C
Hgb A1c MFr Bld: 5.7 % — ABNORMAL HIGH (ref <5.7)
MEAN PLASMA GLUCOSE: 117 mg/dL — AB (ref <117)

## 2015-01-07 LAB — HEPARIN LEVEL (UNFRACTIONATED)
Heparin Unfractionated: <0.1 IU/mL — ABNORMAL LOW (ref 0.30–0.70)
Heparin Unfractionated: 0.16 IU/mL — ABNORMAL LOW (ref 0.30–0.70)
Heparin Unfractionated: 0.74 IU/mL — ABNORMAL HIGH (ref 0.30–0.70)

## 2015-01-07 LAB — GLUCOSE, CAPILLARY
Glucose-Capillary: 81 mg/dL (ref 70–99)
Glucose-Capillary: 120 mg/dL — ABNORMAL HIGH (ref 70–99)
Glucose-Capillary: 73 mg/dL (ref 70–99)
Glucose-Capillary: 71 mg/dL (ref 70–99)

## 2015-01-07 LAB — TSH: TSH: 2.485 u[IU]/mL (ref 0.350–4.500)

## 2015-01-07 MED ORDER — SODIUM CHLORIDE 0.9 % IV SOLN: INTRAVENOUS | Status: AC

## 2015-01-07 MED ORDER — HEPARIN BOLUS VIA INFUSION
2000.0000 [IU] | Freq: Once | INTRAVENOUS | Status: AC
  Administered 2015-01-07: 2000 [IU] via INTRAVENOUS
  Filled 2015-01-07: qty 2000

## 2015-01-07 MED ORDER — PANTOPRAZOLE SODIUM 40 MG IV SOLR
40.0000 mg | Freq: Two times a day (BID) | INTRAVENOUS | Status: DC
  Administered 2015-01-07 – 2015-01-10 (×9): 40 mg via INTRAVENOUS
  Filled 2015-01-07 (×10): qty 40

## 2015-01-07 MED ORDER — HEPARIN (PORCINE) IN NACL 100-0.45 UNIT/ML-% IJ SOLN
1650.0000 [IU]/h | INTRAMUSCULAR | Status: AC
  Administered 2015-01-07: 1400 [IU]/h via INTRAVENOUS
  Administered 2015-01-08: 1650 [IU]/h via INTRAVENOUS
  Filled 2015-01-07 (×6): qty 250

## 2015-01-07 NOTE — Progress Notes (Signed)
ANTICOAGULATION CONSULT NOTE - Follow Up Consult  Pharmacy Consult for Heparin Indication: pulmonary embolus  No Known Allergies  Patient Measurements: Height: 5\' 8"  (172.7 cm) Weight: 146 lb (66.225 kg) IBW/kg (Calculated) : 68.4 Heparin Dosing Weight:   Vital Signs: Temp: 99 F (37.2 C) (01/24 0453) Temp Source: Oral (01/24 0453) BP: 91/59 mmHg (01/24 0453) Pulse Rate: 114 (01/24 0453)  Labs:  Recent Labs  01/06/15 1317 01/06/15 1612 01/07/15 0106  HGB 10.1*  --  9.9*  HCT 31.6*  --  31.0*  PLT 224  --  220  APTT  --  43*  --   LABPROT  --  15.9*  --   INR  --  1.26  --   HEPARINUNFRC  --   --  <0.10*  CREATININE 0.70  --  0.66    Estimated Creatinine Clearance: 83.9 mL/min (by C-G formula based on Cr of 0.66).   Medications:  Infusions:  . sodium chloride 75 mL/hr at 01/06/15 1847  . heparin 1,400 Units/hr (01/07/15 0256)    Assessment: Patient with low heparin level.  No issues per RN.  Goal of Therapy:  Heparin level 0.3-0.7 units/ml Monitor platelets by anticoagulation protocol: Yes   Plan:  Heparin bolus  2000 units iv x1 Increase Heparin drip to 1400  units/hr Daily heparin level and CBC Next heparin level at  0900    Tyler Deis, Shea Stakes [place] 01/07/2015,6:32 AM

## 2015-01-07 NOTE — Progress Notes (Signed)
[facility] PROGRESS NOTE  Daryl Vega OIZ:[id number] DOB: [birth year] DOA: 01/06/2015 PCP: [name], MD  Assessment/Plan: 1-SOB, tachycardia and pleuritic CP due to Pulmonary embolism: most likely associated with increase viscosity from lug cancer. -will continue monitoring for another 24 hours -continue IV heparin (dose per pharmacy) -PRN oxygen supplementation -will discuss with oncology regarding long term anticoagulation agent; but asumed will be lovenox or arixtra  -close follow up of his Hgb and any signs of bleeding, as patient recently had surgery and duodenal bleeding ulcer. -PRN analgesics.  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 For now will monitor and if needed will ask surgery to see patient during admission. -has appointment with Dr. Laney Potash on 1/27  9-Bleeding duodenal ulcer s/p ex lap/oversew 11/30/2014: continue PPI and follow Hgb trend -had BM and no blood was noted  Code Status: DNR Family Communication: wife and son at bedside Disposition Plan: to be determine    Consultants:  Oncology service   Procedures:  See below for x-ray reports   Antibiotics:  None   HPI/Subjective: Afebrile; feeling ok. Experienced episode of vomiting overnight and is nauseated currently.  Objective: Filed Vitals:   01/07/15 1418  BP: 95/61  Pulse:   Temp: 98.3 F (36.8 C)  Resp:      Intake/Output Summary (Last 24 hours) at 01/07/15 1503 Last data filed at 01/07/15 1426  Gross per 24 hour  Intake    120 ml  Output      3 ml  Net    117 ml   Filed Weights   01/06/15 1525 01/06/15 1914  Weight: 66.225 kg (146 lb) 66.225 kg (146 lb)    Exam:   General:  No fever, denies CP. Complaining of nausea and reports episode of vomiting overnight.  Cardiovascular: S1 and S2, no rubs or gallops  Respiratory: no wheezing, no crackles; scattered rhonchi appreciated  Abdomen: soft, ND; ventral wound with dressing in place   Musculoskeletal: no edema, no cyanosis   Data Reviewed: Basic Metabolic Panel:  Recent Labs Lab 01/06/15 1317 01/06/15 2207 01/07/15 0106  NA 132*  --  136  K 3.6  --  3.6  CL 98  --  103  CO2 24  --  24  GLUCOSE 113*  --  101*  BUN 18  --  12  CREATININE 0.70  --  0.66  CALCIUM 8.7  --  8.4  MG  --  1.4*  --   PHOS  --  3.9  --    Liver Function Tests:  Recent Labs Lab 01/06/15 1317 01/06/15 2207  AST 22 21  ALT 19 20  ALKPHOS 173* 177*  BILITOT 1.3* 1.2  PROT 6.9 6.9  ALBUMIN 3.0* 2.8*   CBC:  Recent Labs Lab 01/06/15 1317 01/07/15 0106  WBC 12.5* 9.3  NEUTROABS 9.1*  --   HGB 10.1* 9.9*  HCT 31.6* 31.0*  MCV 86.8 87.8  PLT 224 220   CBG:  Recent Labs Lab 01/06/15 2158 01/07/15 0743 01/07/15 1151  GLUCAP 121* 81 120*  Studies: Dg Chest 2 View  01/06/2015   CLINICAL DATA:  Shortness of breath with hypoxia. History of right-sided lung cancer. Initial encounter.  EXAM: CHEST  2 VIEW  COMPARISON:  12/05/2014 and 12/04/2014 radiographs. Chest CT 10/22/2014.  FINDINGS: The PICC and nasogastric tube have been removed. There is interval improved aeration of both lung bases with minimal residual patchy atelectasis or infiltrates. Right upper lobe scarring is stable. There is no significant pleural effusion. The heart size and mediastinal contours are stable.  IMPRESSION: Interval improvement in bibasilar airspace  opacities compared with prior examination of 1 month ago. Residual densities may reflect atelectasis or resolving inflammation. No new findings.   Electronically Signed   By: Camie Patience M.D.   On: 01/06/2015 12:52   Ct Head Wo Contrast  01/06/2015   CLINICAL DATA:  Status post fall. History of lung cancer with chemotherapy and radiation therapy. Initial encounter.  EXAM: CT HEAD WITHOUT CONTRAST  TECHNIQUE: Contiguous axial images were obtained from the base of the skull through the vertex without intravenous contrast.  COMPARISON:  Head CT 10/28/2014.  MRI brain 08/01/2014.  FINDINGS: There is no evidence of acute intracranial hemorrhage, mass lesion, brain edema or extra-axial fluid collection. The ventricles and subarachnoid spaces are appropriately sized for age. There is no CT evidence of acute cortical infarction. Mild intracranial vascular calcifications are noted.  The visualized paranasal sinuses, mastoid air cells and middle ears are clear. The calvarium is intact.  IMPRESSION: Stable normal appearance of the brain. No acute posttraumatic findings or evidence of metastatic disease.   Electronically Signed   By: Camie Patience M.D.   On: 01/06/2015 13:07   Ct Angio Chest Pe W/cm &/or Wo Cm  01/06/2015   CLINICAL DATA:  Hypoxemia. History of lung cancer diagnosed 5 months ago status post chemotherapy and radiation therapy. Initial encounter.  EXAM: CT ANGIOGRAPHY CHEST WITH CONTRAST  TECHNIQUE: Multidetector CT imaging of the chest was performed using the standard protocol during bolus administration of intravenous contrast. Multiplanar CT image reconstructions and MIPs were obtained to evaluate the vascular anatomy.  CONTRAST:  15mL OMNIPAQUE IOHEXOL 350 MG/ML SOLN  COMPARISON:  Radiographs today.  Chest CT 10/22/2014.  FINDINGS: Vascular: The pulmonary arteries are well opacified with contrast. There is definite acute segmental pulmonary embolism within the left lower lobe pulmonary artery.  Additional smaller foci of embolic disease are present on the left. No definite right-sided emboli demonstrated. There is no involvement of the central pulmonary arteries or right ventricle. There are no signs of right heart strain. There is stable relatively mild atherosclerosis of the aorta, great vessels and coronary arteries. The heart size is normal. A small pericardial effusion appears unchanged.  Mediastinum: No discretely enlarged mediastinal or hilar lymph nodes are identified. There is stable soft tissue fullness in the right hilum with associated central airway narrowing attributed to prior radiation therapy. The thyroid gland, trachea and esophagus demonstrate no significant findings.  Lungs/Pleura: Small to moderate right-greater-than-left pleural effusions have enlarged compared with the prior study. Left-greater-than-right basilar airspace opacities have also progressed. There is minimal no opacity anteriorly in the right middle lobe. The additional paramediastinal opacities in the right lung are stable and attributed to radiation therapy. No focal pulmonary nodules identified.  Upper abdomen: Distal esophageal wall thickening appears improved. The visualized liver appears unremarkable. The adrenal glands are not imaged.  Musculoskeletal/Chest wall: No chest wall lesion or acute osseous findings.No worrisome osseous findings.  Review of the MIP images confirms  the above findings.  IMPRESSION: 1. Study is positive for acute segmental pulmonary embolism within the left lower lobe. No signs of right heart strain. 2. Interval enlargement of bilateral pleural effusions with worsening bibasilar airspace opacities. The latter could be secondary to atelectasis, inflammation or pulmonary infarction. 3. Otherwise stable right upper lobe radiation changes. No evidence of metastatic disease. 4. Critical Value/emergent results were called by telephone at the time of interpretation on 01/06/2015 at 2:48 pm to Dr.  Sherwood Gambler , who verbally acknowledged these results.   Electronically Signed   By: Camie Patience M.D.   On: 01/06/2015 14:48    Scheduled Meds: . atorvastatin  80 mg Oral Daily  . budesonide (PULMICORT) nebulizer solution  0.25 mg Nebulization BID  . calcium citrate  1 tablet Oral Daily  . feeding supplement (ENSURE COMPLETE)  237 mL Oral TID WC  . feeding supplement (PROMOTE)  1,000 mL Per Tube QHS  . glimepiride  2 mg Oral Q breakfast  . insulin aspart  0-9 Units Subcutaneous TID WC  . insulin glargine  10 Units Subcutaneous QHS  . multivitamins with iron  1 tablet Oral QPC supper  . pantoprazole (PROTONIX) IV  40 mg Intravenous Q12H  . potassium chloride SA  20 mEq Oral Daily  . saccharomyces boulardii  250 mg Oral BID   Continuous Infusions: . 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 If 7PM-7AM, please contact night-coverage at [email or web address], password [facility] 01/07/2015, 3:03 PM  LOS: 1 day

## 2015-01-07 NOTE — Progress Notes (Addendum)
ANTICOAGULATION CONSULT NOTE - follow up  Pharmacy Consult for Heparin Indication: pulmonary embolus  No Known Allergies  Patient Measurements: Height: 5\' 8"  (172.7 cm) Weight: 146 lb (66.225 kg) IBW/kg (Calculated) : 68.4 Height: 5'8" Heparin Dosing Weight: 66.2 kg  Vital Signs: Temp: 99 F (37.2 C) (01/24 0453) Temp Source: Oral (01/24 0453) BP: 91/59 mmHg (01/24 0453) Pulse Rate: 114 (01/24 0453)  Labs:  Recent Labs  01/06/15 1317 01/06/15 1612 01/07/15 0106 01/07/15 0830  HGB 10.1*  --  9.9*  --   HCT 31.6*  --  31.0*  --   PLT 224  --  220  --   APTT  --  43*  --   --   LABPROT  --  15.9*  --   --   INR  --  1.26  --   --   HEPARINUNFRC  --   --  <0.10* 0.16*  CREATININE 0.70  --  0.66  --     Estimated Creatinine Clearance: 83.9 mL/min (by C-G formula based on Cr of 0.66).   Medical History: Past Medical History  Diagnosis Date  . DM (diabetes mellitus)   . Pneumonia   . Hypercholesteremia   . Melanoma   . Radiation 08/03/14-08/23/14    35 gray to right chest  . Lung cancer   . FH: chemotherapy    Assessment: 64 y/oM with PMH of lung cancer, DM, hypercholesteremia, anemia, bleeding duodenal ulcer s/p ex lap/oversewing of posterior duodenum 11/30/14 who presents with a fall this morning while in the bathroom. He has a small abrasion where he hit his head. CT of head shows no acute posttraumatic findings or evidence of metastatic disease. CT angio positive for LLL PE. Pharmacy consulted to assist with heparin dosing for this patient.    1/23: 1st HL subtherapeutic on 1200 units/hr; incr to 1400 units/hr Baseline INR high-normal, aPTT slightly elevated.  PMH cancer but no liver disease evident Baseline CBC with low Hgb (consistent with previous admissions), Plt wnl 1/24: 2nd HL subtherapeutic on 1400 units/hr, incr to 1750 units/hr  Today, 01/07/2015:  Hgb low, stable  Pltc WNL  2nd HL SUBtherapeutic on 1400 units/hr  No line issues or bleeding  issues noted per nursing   Goal of Therapy:  Heparin level 0.3-0.7 units/ml Monitor platelets by anticoagulation protocol: Yes   Plan:   Repeat heparin 2000 units IV bolus x 1  Increase heparin infusion to 1750 units/hr; if patient remains SUBtherapeutic after aggressively increasing therapy, would consider antithrombin III deficiency  Heparin level 6 hours after initiation.  Daily heparin level once therapeutic; daily CBC  Monitor closely for signs and symptoms of bleeding given recent bleeding duodenal ulcer, anemia.  F/u plans for long-term anticoagulation.   Reuel Boom, PharmD Pager: [phone] 01/07/2015, 10:18 AM     Addendum: Repeat HL at 1649 is not supra-therapeutic and has trended up significantly from 0.16 to 0.74. RN reports no s/s of bleeding. Will decrease heparin infusion to 1650 units/hr with goal of maintaining anti-Xa levels in the upper end of goal range. F/u 6 hr level and continue monitor for s/s of bleeding.   Albertina Parr, PharmD., BCPS Clinical Pharmacist Pager [phone]

## 2015-01-07 NOTE — Progress Notes (Signed)
Pt vomited at small amount of emesis mixed with undigested food, Zofran IV given as ordered. SRP, RN

## 2015-01-07 NOTE — Progress Notes (Signed)
Highest sat was 89% on 4L, pt denies SOB, no distress noted. Will continue to monitor

## 2015-01-07 NOTE — Progress Notes (Signed)
Pt had BM. No blood noted

## 2015-01-08 ENCOUNTER — Telehealth: Payer: Self-pay | Admitting: Internal Medicine

## 2015-01-08 LAB — CBC
HEMATOCRIT: 27.7 % — AB (ref 39.0–52.0)
Hemoglobin: 8.8 g/dL — ABNORMAL LOW (ref 13.0–17.0)
MCH: 27.8 pg (ref 26.0–34.0)
MCHC: 31.8 g/dL (ref 30.0–36.0)
MCV: 87.7 fL (ref 78.0–100.0)
Platelets: 190 10*3/uL (ref 150–400)
RBC: 3.16 MIL/uL — AB (ref 4.22–5.81)
RDW: 16.5 % — ABNORMAL HIGH (ref 11.5–15.5)
WBC: 6.3 10*3/uL (ref 4.0–10.5)

## 2015-01-08 LAB — GLUCOSE, CAPILLARY
GLUCOSE-CAPILLARY: 81 mg/dL (ref 70–99)
Glucose-Capillary: 103 mg/dL — ABNORMAL HIGH (ref 70–99)
Glucose-Capillary: 191 mg/dL — ABNORMAL HIGH (ref 70–99)
Glucose-Capillary: 142 mg/dL — ABNORMAL HIGH (ref 70–99)

## 2015-01-08 LAB — HEPARIN LEVEL (UNFRACTIONATED)
Heparin Unfractionated: 0.44 IU/mL (ref 0.30–0.70)
Heparin Unfractionated: 0.36 IU/mL (ref 0.30–0.70)

## 2015-01-08 MED ORDER — JEVITY 1.2 CAL PO LIQD: 1000.0000 mL | ORAL | Status: DC

## 2015-01-08 MED ORDER — DULOXETINE HCL 20 MG PO CPEP
20.0000 mg | ORAL_CAPSULE | Freq: Every day | ORAL | Status: DC
  Administered 2015-01-08 – 2015-01-11 (×4): 20 mg via ORAL
  Filled 2015-01-08 (×4): qty 1

## 2015-01-08 MED ORDER — LORATADINE 10 MG PO TABS
10.0000 mg | ORAL_TABLET | Freq: Every day | ORAL | Status: DC
  Administered 2015-01-09 – 2015-01-11 (×3): 10 mg via ORAL
  Filled 2015-01-08 (×3): qty 1

## 2015-01-08 MED ORDER — ENOXAPARIN SODIUM 80 MG/0.8ML ~~LOC~~ SOLN
70.0000 mg | Freq: Once | SUBCUTANEOUS | Status: AC
  Administered 2015-01-08: 70 mg via SUBCUTANEOUS
  Filled 2015-01-08: qty 0.8

## 2015-01-08 MED ORDER — FLUTICASONE PROPIONATE 50 MCG/ACT NA SUSP
2.0000 | Freq: Every day | NASAL | Status: DC
  Administered 2015-01-09 – 2015-01-11 (×3): 2 via NASAL
  Filled 2015-01-08: qty 16

## 2015-01-08 MED ORDER — HYDROCOD POLST-CHLORPHEN POLST 10-8 MG/5ML PO LQCR: 5.0000 mL | Freq: Two times a day (BID) | ORAL | Status: DC | PRN

## 2015-01-08 MED ORDER — ENOXAPARIN (LOVENOX) PATIENT EDUCATION KIT
PACK | Freq: Once | Status: AC
  Administered 2015-01-09: 08:00:00
  Filled 2015-01-08: qty 1

## 2015-01-08 MED ORDER — ENOXAPARIN SODIUM 80 MG/0.8ML ~~LOC~~ SOLN
1.0000 mg/kg | Freq: Two times a day (BID) | SUBCUTANEOUS | Status: DC
  Administered 2015-01-09 – 2015-01-10 (×4): 65 mg via SUBCUTANEOUS
  Filled 2015-01-08 (×5): qty 0.8

## 2015-01-08 NOTE — Progress Notes (Signed)
Promote going  64ml/hr Via PEg tube. 0 residual, pt denies nausea.

## 2015-01-08 NOTE — Progress Notes (Signed)
ANTICOAGULATION CONSULT NOTE - Follow Up Consult  Pharmacy Consult for Heparin Indication: pulmonary embolus  No Known Allergies  Patient Measurements: Height: 5\' 8"  (172.7 cm) Weight: 146 lb (66.225 kg) IBW/kg (Calculated) : 68.4 Heparin Dosing Weight: actual weight  Vital Signs: Temp: 98.6 F (37 C) (01/25 0419) Temp Source: Oral (01/25 0419) BP: 96/67 mmHg (01/25 0419) Pulse Rate: 99 (01/25 0419)  Labs:  Recent Labs  01/06/15 1317 01/06/15 1612  01/07/15 0106 01/07/15 0830 01/07/15 1649 01/08/15 0040  HGB 10.1*  --   --  9.9*  --   --  8.8*  HCT 31.6*  --   --  31.0*  --   --  27.7*  PLT 224  --   --  220  --   --  190  APTT  --  43*  --   --   --   --   --   LABPROT  --  15.9*  --   --   --   --   --   INR  --  1.26  --   --   --   --   --   HEPARINUNFRC  --   --   < > <0.10* 0.16* 0.74* 0.44  CREATININE 0.70  --   --  0.66  --   --   --   < > = values in this interval not displayed.  Estimated Creatinine Clearance: 83.9 mL/min (by C-G formula based on Cr of 0.66).   Medications:  Infusions:  . heparin 1,650 Units/hr (01/07/15 1818)    Assessment: 32 y/oM with PMH of lung cancer, DM, hypercholesteremia, anemia, bleeding duodenal ulcer s/p ex lap/oversewing of posterior duodenum 11/30/14 who presents s/p fall in the bathroom at home. He has a small abrasion where he hit his head. CT of head shows no acute posttraumatic findings or evidence of metastatic disease. CT angio positive for LLL PE. Pharmacy consulted to dose heparin drip.    Significant events: 1/23 Heparin started 1/24 Heparin level < 0.1 on heparin at 1200 units/hr 1/24 Heparin level 0.16 on heparin at  1400 units/hr 1/24 Heparin level 0.74 on heparin at  1750 units/hr 1/25 Heparin level 0.44 on heparin at 1650 units/hr   Today, 01/08/2015:  Heparin level 0.36, remains therapeutic on heparin at 1650 units/hr.  CBC: Hgb 8.8, Plt 190  SCr 0.66, CrCl ~ 84 ml/min   Goal of Therapy:   Heparin level 0.3-0.7 units/ml Monitor platelets by anticoagulation protocol: Yes   Plan:   Continue heparin IV infusion at 1650 units/hr  Heparin level in 8 hours to confirm therapeutic level  Daily heparin level and CBC  Follow up plans for long-term anticoagulation.   Gretta Arab PharmD, BCPS Pager [phone] 01/08/2015 7:48 AM

## 2015-01-08 NOTE — Progress Notes (Signed)
ANTICOAGULATION CONSULT NOTE - Follow Up Consult  Pharmacy Consult for Heparin Indication: pulmonary embolus  No Known Allergies  Patient Measurements: Height: 5\' 8"  (172.7 cm) Weight: 146 lb (66.225 kg) IBW/kg (Calculated) : 68.4 Heparin Dosing Weight:   Vital Signs: Temp: 98.6 F (37 C) (01/25 0419) Temp Source: Oral (01/25 0419) BP: 96/67 mmHg (01/25 0419) Pulse Rate: 99 (01/25 0419)  Labs:  Recent Labs  01/06/15 1317 01/06/15 1612  01/07/15 0106 01/07/15 0830 01/07/15 1649 01/08/15 0040  HGB 10.1*  --   --  9.9*  --   --  8.8*  HCT 31.6*  --   --  31.0*  --   --  27.7*  PLT 224  --   --  220  --   --  190  APTT  --  43*  --   --   --   --   --   LABPROT  --  15.9*  --   --   --   --   --   INR  --  1.26  --   --   --   --   --   HEPARINUNFRC  --   --   < > <0.10* 0.16* 0.74* 0.44  CREATININE 0.70  --   --  0.66  --   --   --   < > = values in this interval not displayed.  Estimated Creatinine Clearance: 83.9 mL/min (by C-G formula based on Cr of 0.66).   Medications:  Infusions:  . heparin 1,650 Units/hr (01/07/15 1818)    Assessment: Patient with heparin level at goal now.  No issues per RN.  Goal of Therapy:  Heparin level 0.3-0.7 units/ml Monitor platelets by anticoagulation protocol: Yes   Plan:  Continue heparin drip at current rate Recheck level at 0800  Tyler Deis, [address] [place] 01/08/2015,4:53 AM

## 2015-01-08 NOTE — Progress Notes (Signed)
Assumed care of patient. VSS stable. Cont with plan of  Care. No change in initial am assesment

## 2015-01-08 NOTE — Care Management Note (Addendum)
    Page 1 of 2   01/11/2015     10:46:00 AM CARE MANAGEMENT NOTE 01/11/2015  Patient:  Daryl Vega, Daryl Vega   Account Number:  [id number]  Date Initiated:  01/08/2015  Documentation initiated by:  Dessa Phi  Subjective/Objective Assessment:   69 y/o m admitted w/Pulmonary Embolism.Readmit.JP:[id number] tube.     Action/Plan:   From home w/spouse.Active w/[facility]-HHRN/PT/aide.   Anticipated DC Date:  01/11/2015   Anticipated DC Plan:  [place]  CM consult      [facility] Choice  Resumption Of Svcs/PTA Provider   Choice offered to / List presented to:  C-1 Patient        [place] arranged  HH-1 RN  [place].   Status of service:  Completed, signed off Medicare Important Message given?   (If response is "NO", the following Medicare IM given date fields will be blank) Date Medicare IM given:   Medicare IM given by:   Date Additional Medicare IM given:   Additional Medicare IM given by:    Discharge Disposition:  [place]  Per UR Regulation:  Reviewed for med. necessity/level of care/duration of stay  If discussed at [place] of Stay Meetings, dates discussed:   01/11/2015    Comments:  01/11/15 Dessa Phi RN BSN NCM [phone] [facility] aware of [place] orders, & d/c. No further d/c needs.  01/10/15 Dessa Phi RN BSN NCM [phone] Please put in HHRN-if pt/inr checks needed,assessment of wound.HHPT/OT/aide;Home TF.Lovenox$101.92/generic-$10/xarelto-$100.  01/08/15 Dessa Phi RN BSN NCM [phone] Confirmed w/[facility] Cyril Mourning active w/HHRN-TF,HHPT/aide.Please put in [facility] orders-RN, tube feed orders,HHPT,HHaide.Benefits checked for co pay: Lovenox $101.92/generic$10;xarelto-$100.MD notifed.Patient/spouse informed,& voiced understanding.

## 2015-01-08 NOTE — Progress Notes (Signed)
ANTICOAGULATION CONSULT NOTE - Follow Up Consult  Pharmacy Consult for Heparin >> Lovenox Indication: pulmonary embolus  No Known Allergies  Patient Measurements: Height: 5\' 8"  (172.7 cm) Weight: 146 lb (66.225 kg) IBW/kg (Calculated) : 68.4 Heparin Dosing Weight: actual weight  Vital Signs: Temp: 99.2 F (37.3 C) (01/25 1457) Temp Source: Oral (01/25 1457) BP: 96/56 mmHg (01/25 1457) Pulse Rate: 101 (01/25 1457)  Labs:  Recent Labs  01/06/15 1317 01/06/15 1612 01/07/15 0106  01/07/15 1649 01/08/15 0040 01/08/15 0735  HGB 10.1*  --  9.9*  --   --  8.8*  --   HCT 31.6*  --  31.0*  --   --  27.7*  --   PLT 224  --  220  --   --  190  --   APTT  --  43*  --   --   --   --   --   LABPROT  --  15.9*  --   --   --   --   --   INR  --  1.26  --   --   --   --   --   HEPARINUNFRC  --   --  <0.10*  < > 0.74* 0.44 0.36  CREATININE 0.70  --  0.66  --   --   --   --   < > = values in this interval not displayed.  Estimated Creatinine Clearance: 83.9 mL/min (by C-G formula based on Cr of 0.66).   Medications:  Infusions:  . heparin 1,650 Units/hr (01/08/15 1556)    Assessment: 62 y/oM with PMH of lung cancer, DM, hypercholesteremia, anemia, bleeding duodenal ulcer s/p ex lap/oversewing of posterior duodenum 11/30/14 who presents s/p fall in the bathroom at home. He has a small abrasion where he hit his head. CT of head shows no acute posttraumatic findings or evidence of metastatic disease. CT angio positive for LLL PE. Pharmacy consulted to dose heparin drip.    Significant events: 1/23 Heparin started 1/24 Heparin level < 0.1 on heparin at 1200 units/hr 1/24 Heparin level 0.16 on heparin at  1400 units/hr 1/24 Heparin level 0.74 on heparin at  1750 units/hr 1/25 Heparin level 0.44 on heparin at 1650 units/hr  1/25 PM UPDATE: asked to transition heparin to Lovenox to facilitate outpatient treatment   Heparin level 0.36, remains therapeutic on heparin at 1650  units/hr.  CBC: Hgb 8.8, Plt 190  SCr 0.66, CrCl ~ 84 ml/min; renal function intact  Goal of Therapy:  Prevention of recurrent VTE Monitor platelets by anticoagulation protocol: Yes    Plan:   Stop heparin infusion at 17:00 today  Give enoxaparin 70 mg x 1 at 18:00 today  Begin enoxaparin 1 mg/kg (65 mg) q12h starting at 08:00 tomorrow  Nursing will provide enoxaparin education  F/u renal function, CBC at least q72 hrs while on enoxaparin in hospital   Reuel Boom, PharmD Pager: [phone] 01/08/2015, 3:53 PM

## 2015-01-08 NOTE — Progress Notes (Signed)
INITIAL NUTRITION ASSESSMENT  DOCUMENTATION CODES Per approved criteria  -Severe malnutrition in the context of chronic illness  Pt meets criteria for severe MALNUTRITION in the context of chronic illness as evidenced by PO intake < 75% for > one month, 7% body weight loss x 1 month.  INTERVENTION: -Continue nocturnal feeds (10pm-8am) of Promote at 70 ml/hr. Tube feeding regimen provides 840 kcal (42% est kcal needs), 53 gram protein (59% est protein needs), and 705 ml free water. Flush tube with 60 ml before and after feeding. -Continue Ensure Complete po TID, each supplement provides 350 kcal and 13 grams of protein -Provide HS snack daily (Milkshake) -May need to increase TF if patient is unable to meet the rest of his estimated nutritional needs from PO. -Encourage PO and supplemental intake -RD to follow-up for further taste change education  NUTRITION DIAGNOSIS: Inadequate oral intake related to taste changes as evidenced by PO intake < 75%.  Goal: Pt to meet >/= 90% of their estimated nutrition needs   Monitor:  TF regimen & tolerance, PO intake, weight, labs, I/O's  Reason for Assessment: Consult for TF and nutritional assessment  Admitting Dx: Pulmonary embolism  ASSESSMENT: 69 y.o. male with PMH significant for DM, HLD, mixed type COPD; duodenal bleeding ulcer (status post surgery), protein calorie malnutrition (on enteral feeding) and lung cancer (actively receiving chemo); presented to ED after falling at home.  Pt was nutritionally assessed and followed during last admission (11/30/14-12/21/14). Pt was also seen by [place] RD, last seen 08/15/14.  Pt continues to c/o of taste changes, stating "nothing tastes good". Pt not very talkative and appeared frustrated with his tasting issues.  With some encouragement, pt agreed to try different flavors of Ensure supplements, agreed to finish the Ensure he was currently sipping on and would like to try a strawberry  milkshake as a HS snack. RD to follow-up with education regarding taste changes.  Pt is receiving nightly feeds (10pm-8am) of Promote at 46ml/hr, providing 840 kcal (47% est kcal needs), 53 gram protein (62% est protein needs), and 705 ml free water.  Per RN, pt is tolerating TF well.   Pt's wife is concerned pt is not meeting nutritional needs d/t poor PO intake. May need to increase TF if patient is unable to meet the rest of his estimated nutritional needs from PO.  Pt has had further weight loss, 11 lb since 12/07/14 (7% weight loss x 1 month, significant for time frame).  Labs reviewed: Low Mg   Height: Ht Readings from Last 1 Encounters:  01/06/15 5\' 8"  (1.727 m)    Weight: Wt Readings from Last 1 Encounters:  01/06/15 146 lb (66.225 kg)    Ideal Body Weight: 154 lb  % Ideal Body Weight: 95%  Wt Readings from Last 10 Encounters:  01/06/15 146 lb (66.225 kg)  12/07/14 157 lb 3 oz (71.3 kg)  11/21/14 154 lb 1.6 oz (69.899 kg)  11/08/14 155 lb 11.2 oz (70.625 kg)  10/22/14 156 lb 8 oz (70.988 kg)  10/11/14 170 lb 14.4 oz (77.52 kg)  09/25/14 162 lb 6.4 oz (73.664 kg)  09/19/14 165 lb (74.844 kg)  09/06/14 165 lb 3.2 oz (74.934 kg)  08/17/14 165 lb 4.8 oz (74.98 kg)    Usual Body Weight: 200-210 lb  % Usual Body Weight: 73%  BMI:  Body mass index is 22.2 kg/(m^2).  Estimated Nutritional Needs: Kcal: 2000-2200 Protein: 90-100g Fluid: 2L/day  Skin: intact  Diet Order: Diet Carb Modified  EDUCATION NEEDS: -Education needs addressed   Intake/Output Summary (Last 24 hours) at 01/08/15 1245 Last data filed at 01/08/15 0700  Gross per 24 hour  Intake 2179.8 ml  Output    802 ml  Net 1377.8 ml    Last BM: 1/25  Labs:   Recent Labs Lab 01/06/15 1317 01/06/15 2207 01/07/15 0106  NA 132*  --  136  K 3.6  --  3.6  CL 98  --  103  CO2 24  --  24  BUN 18  --  12  CREATININE 0.70  --  0.66  CALCIUM 8.7  --  8.4  MG  --  1.4*  --   PHOS  --  3.9   --   GLUCOSE 113*  --  101*    CBG (last 3)   Recent Labs  01/07/15 2053 01/08/15 0723 01/08/15 1139  GLUCAP 71 103* 191*    Scheduled Meds: . atorvastatin  80 mg Oral Daily  . budesonide (PULMICORT) nebulizer solution  0.25 mg Nebulization BID  . calcium citrate  1 tablet Oral Daily  . feeding supplement (ENSURE COMPLETE)  237 mL Oral TID WC  . feeding supplement (PROMOTE)  1,000 mL Per Tube QHS  . glimepiride  2 mg Oral Q breakfast  . insulin aspart  0-9 Units Subcutaneous TID WC  . insulin glargine  10 Units Subcutaneous QHS  . multivitamins with iron  1 tablet Oral QPC supper  . pantoprazole (PROTONIX) IV  40 mg Intravenous Q12H  . potassium chloride SA  20 mEq Oral Daily  . saccharomyces boulardii  250 mg Oral BID    Continuous Infusions: . heparin 1,650 Units/hr (01/07/15 1818)    Past Medical History  Diagnosis Date  . DM (diabetes mellitus)   . Pneumonia   . Hypercholesteremia   . Melanoma   . Radiation 08/03/14-08/23/14    35 gray to right chest  . Lung cancer   . FH: chemotherapy     Past Surgical History  Procedure Laterality Date  . Video bronchoscopy Bilateral 07/20/2014    Procedure: VIDEO BRONCHOSCOPY WITHOUT FLUORO;  Surgeon: Tanda Rockers, MD;  Location: [facility];  Service: Cardiopulmonary;  Laterality: Bilateral;  . Esophagogastroduodenoscopy N/A 11/30/2014    Procedure: ESOPHAGOGASTRODUODENOSCOPY (EGD);  Surgeon: [name], MD;  Location: [facility];  Service: Endoscopy;  Laterality: N/A;  . Laparotomy N/A 11/30/2014    Procedure: EXPLORATORY LAPAROTOMY, PYLOROPLASTY, OVERSEWING OF POSTERIOR DUODENUM, DUODENOSTOMY, CHOLECYSTECTOMY, JEJEUNOSTOMY;  Surgeon: Jackolyn Confer, MD;  Location: [facility];  Service: General;  Laterality: N/A;    Clayton Bibles, MS, RD, LDN Pager: [phone] After Hours Pager: [phone]

## 2015-01-08 NOTE — Progress Notes (Signed)
[facility] PROGRESS NOTE  Daryl Vega FIE:[id number] DOB: December 07, 1946 DOA: 01/06/2015 PCP: [name], MD  Assessment/Plan: 1-SOB, tachycardia and pleuritic CP due to Pulmonary embolism: most likely associated with increase viscosity from lug cancer. -continue IV heparin (dose per pharmacy) with instructions to switch to Lovenox (see below). -Continue PRN oxygen supplementation and assess home oxygen needs -will will start treatment with Lovenox; review with case manager and patient cooperate for Lovenox will be $10. Pharmacy to switch anticoagulation to Lovenox and if the patient remains stable we will be able to discharge him in the next 24-48 hours.  -close follow up of his Hgb and any signs of bleeding, as patient recently had surgery and duodenal bleeding ulcer. -PRN analgesics.  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 For now will monitor and if needed will ask surgery to see patient during admission. -has appointment with Dr. Laney Potash on 1/27 -Please contact general surgery on the 26 if the patient will still be in the hospital by that time for examination and further instructions regarding his wounds and drain  9-Bleeding duodenal ulcer s/p ex laparoscopy 11/30/2014:  -continue PPI and follow Hgb trend -had BM and no blood was  noted  10-allergic rhinitis: Will start patient on Claritin and fluticasone.  Code Status: DNR Family Communication: wife and son at bedside Disposition Plan: to be determine    Consultants:  Oncology service   Procedures:  See below for x-ray reports   Antibiotics:  None   HPI/Subjective: Afebrile. Patient reports runny nose, coughing spells and neuropathy affecting his upper extremities and hands bilaterally. Denies nausea and vomiting; so far able to tolerate food intake and also his enteral feedings.  Objective: Filed Vitals:   01/08/15 1457  BP: 96/56  Pulse: 101  Temp: 99.2 F (37.3 C)  Resp: 17    Intake/Output Summary (Last 24 hours) at 01/08/15 1605 Last data filed at 01/08/15 1342  Gross per 24 hour  Intake 1612.3 ml  Output    500 ml  Net 1112.3 ml   Filed Weights   01/06/15 1525 01/06/15 1914  Weight: 66.225 kg (146 lb) 66.225 kg (146 lb)    Exam:   General:  No fever, denies CP. Complaining of coughing spells, runny nose and neuropathy (pain, numbness and tingling of his upper extremities). No nausea or vomiting.  Cardiovascular: S1 and S2, no rubs or gallops  Respiratory: no wheezing, no crackles; scattered rhonchi appreciated  Abdomen: soft, ND; ventral wound with dressing in place   Musculoskeletal: no edema, no cyanosis   Data Reviewed: Basic Metabolic Panel:  Recent Labs Lab 01/06/15 1317 01/06/15 2207 01/07/15 0106  NA 132*  --  136  K 3.6  --  3.6  CL 98  --  103  CO2 24  --  24  GLUCOSE 113*  --  101*  BUN 18  --  12  CREATININE 0.70  --  0.66  CALCIUM 8.7  --  8.4  MG  --  1.4*  --  PHOS  --  3.9  --    Liver Function Tests:  Recent Labs Lab 01/06/15 1317 01/06/15 2207  AST 22 21  ALT 19 20  ALKPHOS 173* 177*  BILITOT 1.3* 1.2  PROT 6.9 6.9  ALBUMIN 3.0* 2.8*   CBC:  Recent Labs Lab 01/06/15 1317 01/07/15 0106 01/08/15 0040  WBC 12.5* 9.3 6.3  NEUTROABS 9.1*  --   --   HGB 10.1* 9.9* 8.8*  HCT  31.6* 31.0* 27.7*  MCV 86.8 87.8 87.7  PLT 224 220 190   CBG:  Recent Labs Lab 01/07/15 1151 01/07/15 1710 01/07/15 2053 01/08/15 0723 01/08/15 1139  GLUCAP 120* 73 71 103* 191*   Studies: No results found.  Scheduled Meds: . atorvastatin  80 mg Oral Daily  . budesonide (PULMICORT) nebulizer solution  0.25 mg Nebulization BID  . calcium citrate  1 tablet Oral Daily  . feeding supplement (ENSURE COMPLETE)  237 mL Oral TID WC  . feeding supplement (PROMOTE)  1,000 mL Per Tube QHS  . glimepiride  2 mg Oral Q breakfast  . insulin aspart  0-9 Units Subcutaneous TID WC  . insulin glargine  10 Units Subcutaneous QHS  . multivitamins with iron  1 tablet Oral QPC supper  . pantoprazole (PROTONIX) IV  40 mg Intravenous Q12H  . potassium chloride SA  20 mEq Oral Daily  . saccharomyces boulardii  250 mg Oral BID   Continuous Infusions: . 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 If 7PM-7AM, please contact night-coverage at [email or web address], password [facility] 01/08/2015, 4:05 PM  LOS: 2 days

## 2015-01-08 NOTE — Telephone Encounter (Signed)
Labs/ov per 01/25 POF, sch pt with MD/ML, lft msg on pt's vm confirming d/t but after looking at his visits he was hospitalized on 01/06/15. Spk w/MD desk nurse and advised and cancelled apt lft msg on pt's vm confirming and to call once he gets out of the hospital to r/s... KJ

## 2015-01-08 NOTE — Consult Note (Signed)
WOC wound consult note Reason for Consult:Healing midline incision (chronic) Wound type:surgical Pressure Ulcer POA: No Measurement:Two areas.  Distal measures 0.5cm x 0.2cm x 0.2cm and Proximal measures 1.5cm x 0.4cm x 0.2cm. Wound HKF:EXMD, moist, granulating Drainage (amount, consistency, odor) scant serous Periwound:intact Dressing procedure/placement/frequency:continmue twice daily saline dressings.  Wife is independent with this dressing change at home and is monitored by [facility].  Please resume those services upon discharge if patient is to return home. [place] nursing team will not follow, but will remain available to this patient, the nursing and medical teams.  Please re-consult if needed. Thanks, Maudie Flakes, MSN, RN, [place], [place], [place] [phone])

## 2015-01-09 ENCOUNTER — Other Ambulatory Visit: Payer: BC Managed Care – PPO

## 2015-01-09 ENCOUNTER — Ambulatory Visit: Payer: BC Managed Care – PPO | Admitting: Internal Medicine

## 2015-01-09 DIAGNOSIS — K264 Chronic or unspecified duodenal ulcer with hemorrhage: Secondary | ICD-10-CM

## 2015-01-09 LAB — BASIC METABOLIC PANEL
Anion gap: 6 (ref 5–15)
BUN: 9 mg/dL (ref 6–23)
CALCIUM: 8.2 mg/dL — AB (ref 8.4–10.5)
CO2: 27 mmol/L (ref 19–32)
Chloride: 104 mmol/L (ref 96–112)
Creatinine, Ser: 0.56 mg/dL (ref 0.50–1.35)
GFR calc Af Amer: >90 mL/min (ref >90)
GFR calc non Af Amer: >90 mL/min (ref >90)
Glucose, Bld: 179 mg/dL — ABNORMAL HIGH (ref 70–99)
Potassium: 3.5 mmol/L (ref 3.5–5.1)
SODIUM: 137 mmol/L (ref 135–145)

## 2015-01-09 LAB — GLUCOSE, CAPILLARY
Glucose-Capillary: 160 mg/dL — ABNORMAL HIGH (ref 70–99)
Glucose-Capillary: 218 mg/dL — ABNORMAL HIGH (ref 70–99)
Glucose-Capillary: 127 mg/dL — ABNORMAL HIGH (ref 70–99)

## 2015-01-09 LAB — CBC
HEMATOCRIT: 26.9 % — AB (ref 39.0–52.0)
Hemoglobin: 8.7 g/dL — ABNORMAL LOW (ref 13.0–17.0)
MCH: 28.6 pg (ref 26.0–34.0)
MCHC: 32.3 g/dL (ref 30.0–36.0)
MCV: 88.5 fL (ref 78.0–100.0)
Platelets: 205 10*3/uL (ref 150–400)
RBC: 3.04 MIL/uL — ABNORMAL LOW (ref 4.22–5.81)
RDW: 16.3 % — ABNORMAL HIGH (ref 11.5–15.5)
WBC: 4.6 10*3/uL (ref 4.0–10.5)

## 2015-01-09 NOTE — Progress Notes (Signed)
[facility] PROGRESS NOTE  Daryl Vega GQQ:[id number] DOB: 03-Jul-1946 DOA: 01/06/2015 PCP: [name], MD  Assessment/Plan: 1-Acute Pulmonary embolism left lower lobe: most likely associated with increase viscosity from lug cancer. -continue with lovenox.  -Continue PRN oxygen supplementation and assess home oxygen needs - review with case manager and patient cooperate for Lovenox will be $10. Pharmacy to switch anticoagulation to Lovenox and if the patient remains stable we will be able to discharge him in the next 24-48 hours.  -close follow up of his Hgb and any signs of bleeding, as patient recently had surgery and duodenal bleeding ulcer. -PRN analgesics. -try to titrate oxygen off as possible.  Incentive spirometry.   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   7-Protein-calorie malnutrition, severe:  -continue enteral feeding and low carb diet -nutritional service consulted; will follow recommendations.  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  -has appointment with Dr. Laney Potash on 1/27 -general surgery will be informed of patient admission.   9-Bleeding duodenal ulcer s/p ex laparoscopy 11/30/2014:  -continue PPI and follow Hgb trend -had BM and no blood was noted  10-allergic rhinitis:  Claritin and fluticasone.  Code Status: DNR Family Communication: care discussed with patient.  Disposition Plan: to be determine    Consultants:  Oncology service   Procedures:  See below for x-ray  reports   Antibiotics:  None   HPI/Subjective: Feeling ok, report dark stool ever since surgery.  Breathing ok.   Objective: Filed Vitals:   01/09/15 0419  BP: 102/67  Pulse: 95  Temp: 98.6 F (37 C)  Resp: 16    Intake/Output Summary (Last 24 hours) at 01/09/15 1125 Last data filed at 01/09/15 1000  Gross per 24 hour  Intake    120 ml  Output   1350 ml  Net  -1230 ml   Filed Weights   01/06/15 1525 01/06/15 1914 01/09/15 0554  Weight: 66.225 kg (146 lb) 66.225 kg (146 lb) 65.318 kg (144 lb)    Exam:   General:no acute distress.   Cardiovascular: S1 and S2, no rubs or gallops  Respiratory: no wheezing, no crackles; scattered rhonchi appreciated  Abdomen: soft, ND; ventral wound with dressing in place   Musculoskeletal: no edema, no cyanosis   Data Reviewed: Basic Metabolic Panel:  Recent Labs Lab 01/06/15 1317 01/06/15 2207 01/07/15 0106  NA 132*  --  136  K 3.6  --  3.6  CL 98  --  103  CO2 24  --  24  GLUCOSE 113*  --  101*  BUN 18  --  12  CREATININE 0.70  --  0.66  CALCIUM 8.7  --  8.4  MG  --  1.4*  --   PHOS  --  3.9  --    Liver Function Tests:  Recent Labs Lab 01/06/15 1317 01/06/15 2207  AST 22 21  ALT 19 20  ALKPHOS 173* 177*  BILITOT 1.3* 1.2  PROT 6.9 6.9  ALBUMIN 3.0* 2.8*   CBC:  Recent Labs Lab 01/06/15 1317 01/07/15 0106 01/08/15 0040 01/09/15 0429  WBC 12.5* 9.3 6.3 4.6  NEUTROABS 9.1*  --   --   --  HGB 10.1* 9.9* 8.8* 8.7*  HCT 31.6* 31.0* 27.7* 26.9*  MCV 86.8 87.8 87.7 88.5  PLT 224 220 190 205   CBG:  Recent Labs Lab 01/08/15 0723 01/08/15 1139 01/08/15 1723 01/08/15 2126 01/09/15 0759  GLUCAP 103* 191* 142* 81 160*   Studies: No results found.  Scheduled Meds: . atorvastatin  80 mg Oral Daily  . budesonide (PULMICORT) nebulizer solution  0.25 mg Nebulization BID  . calcium citrate  1 tablet Oral Daily  . DULoxetine  20 mg Oral Daily  . enoxaparin (LOVENOX) injection  1 mg/kg  Subcutaneous Q12H  . feeding supplement (ENSURE COMPLETE)  237 mL Oral TID WC  . feeding supplement (PROMOTE)  1,000 mL Per Tube QHS  . fluticasone  2 spray Each Nare Daily  . glimepiride  2 mg Oral Q breakfast  . insulin aspart  0-9 Units Subcutaneous TID WC  . insulin glargine  10 Units Subcutaneous QHS  . loratadine  10 mg Oral Daily  . multivitamins with iron  1 tablet Oral QPC supper  . pantoprazole (PROTONIX) IV  40 mg Intravenous Q12H  . potassium chloride SA  20 mEq Oral Daily  . 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 If 7PM-7AM, please contact night-coverage at [email or web address], password [facility] 01/09/2015, 11:25 AM  LOS: 3 days

## 2015-01-09 NOTE — Progress Notes (Signed)
[facility] Progress Note     Subjective: 69 y/o white male with h/o non small cell lung carcinoma (Stage IIIB (T3, N3, M0).  He is well known to our practice due to extensive recent surgery.  On 11/30/14 he was found to have a bleeding duodenal ulcer, he underwent Ex lap, oversew of posterior bleeding DU, Heineke Mikulicz pyloroplasty, cholecystectomy, repair of right hepatic artery, repair of cholecystoduodenal fistula and placement of duodenostomy tube, feeding jejunostomy on 11/30/14 by Dr. Jackolyn Confer.  During his hospitalization he had C.diff colitis and was treated with vancomycin.  He had poor appetite, and was managed with tube feedings through his J tube at 16mL/hr 8pm to 8am.  He has been on a carb mod diet at home as tolerated, but PO intake is inadquate.    His family brought him to the ED after a fall.  His family reported SOB.  He was found to have a  Pulmonary embolism of the left lung.    Objective: Vital signs in last 24 hours: Temp:  [98.6 F (37 C)-99.5 F (37.5 C)] 98.6 F (37 C) (01/26 0419) Pulse Rate:  [95-105] 95 (01/26 0419) Resp:  [16-18] 16 (01/26 0419) BP: (96-102)/(56-67) 102/67 mmHg (01/26 0419) SpO2:  [96 %-99 %] 98 % (01/26 0718) Weight:  [144 lb (65.318 kg)] 144 lb (65.318 kg) (01/26 0554) Last BM Date: 01/08/15  Intake/Output from previous day: 01/25 0701 - 01/26 0700 In: 120 [P.O.:120] Out: 1350 [Urine:1350] Intake/Output this shift: Total I/O In: 120 [P.O.:120] Out: -   PE: General: pleasant, WD/WN white male who is laying in bed in NAD HEENT: head is normocephalic, atraumatic.  Sclera are noninjected.  Ears and nose without any masses or lesions.  Mouth is pink and moist Heart: regular, rate, and rhythm.  Normal s1,s2. No obvious murmurs, gallops, or rubs noted.  Palpable radial and pedal pulses bilaterally Lungs: CTAB, no wheezes, rhonchi, or rales noted.  Respiratory effort nonlabored Abd: soft, NT/ND, +BS, duo tube in place  which is clamped, J tube with tube feedings on MS: all 4 extremities are symmetrical with no cyanosis, clubbing, or edema. Skin: pale skin, warm and dry with no masses, lesions, or rashes Psych: A&Ox3 with an appropriate affect.   Lab Results:   Recent Labs  01/08/15 0040 01/09/15 0429  WBC 6.3 4.6  HGB 8.8* 8.7*  HCT 27.7* 26.9*  PLT 190 205   BMET  Recent Labs  01/06/15 1317 01/07/15 0106  NA 132* 136  K 3.6 3.6  CL 98 103  CO2 24 24  GLUCOSE 113* 101*  BUN 18 12  CREATININE 0.70 0.66  CALCIUM 8.7 8.4   PT/INR  Recent Labs  01/06/15 1612  LABPROT 15.9*  INR 1.26   CMP     Component Value Date/Time   NA 136 01/07/2015 0106   NA 139 11/21/2014 1440   K 3.6 01/07/2015 0106   K 3.8 11/21/2014 1440   CL 103 01/07/2015 0106   CO2 24 01/07/2015 0106   CO2 22 11/21/2014 1440   GLUCOSE 101* 01/07/2015 0106   GLUCOSE 150* 11/21/2014 1440   BUN 12 01/07/2015 0106   BUN 14.5 11/21/2014 1440   CREATININE 0.66 01/07/2015 0106   CREATININE 0.9 11/21/2014 1440   CALCIUM 8.4 01/07/2015 0106   CALCIUM 9.8 11/21/2014 1440   PROT 6.9 01/06/2015 2207   PROT 6.8 11/21/2014 1440   ALBUMIN 2.8* 01/06/2015 2207   ALBUMIN 3.6 11/21/2014 1440   AST 21  01/06/2015 2207   AST 15 11/21/2014 1440   ALT 20 01/06/2015 2207   ALT 18 11/21/2014 1440   ALKPHOS 177* 01/06/2015 2207   ALKPHOS 95 11/21/2014 1440   BILITOT 1.2 01/06/2015 2207   BILITOT 0.83 11/21/2014 1440   GFRNONAA >90 01/07/2015 0106   GFRAA >90 01/07/2015 0106   Lipase     Component Value Date/Time   LIPASE 41 11/29/2014 0644       Studies/Results: No results found.  Anti-infectives: Anti-infectives    None       Assessment/Plan 1. Bleeding duodenal ulcer with cholecystoduodenal fistula --EGD for control of bleeding - 11/30/16 - [name] --Ex Lap, oversewing of posterior bleeding DU, Heineke Mikulicz pyloroplasty, cholecystectomy, repair of right hepatic artery, repair of  cholecystoduodenal fistula and placement of duodenostomy tube, feeding jejunostomy - 11/30/14 - Dr. Jackolyn Confer 2. Left pulmonary embolism 3. 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 (07/2014) 4. PCM, severe - on low carb diet 5.  Midline wound  Plan: 1.  Continue dressing changes although his dressing needs to be much less substantial to what he has now which is a large ABD over a tiny midline wound.  Minimal dry dressing to midline wound 2.  Continue enteral/tube feedings through jejunostomy tube at 57mL/hr, would recommend cyclical feeding between 8 or 10pm-8am like he gets at home.  Encourage PO intake.  May need to back off of TF at some point in order to drive more PO intake.  May need dietitian help with this.  3.  Would definitely keep J tube for now, will leave removal of duodenostomy tube up to Dr. Barry Dienes and/or [name].  His appointment was with [name] tomorrow, but he won't make it due to his hospitalization.   4.  Will attempt to reschedule patient in a few weeks for follow up when we know he'll be discharged.     LOS: 3 days    Coralie Keens 01/09/2015, 12:25 PM Pager: [phone]

## 2015-01-09 NOTE — Progress Notes (Signed)
NUTRITION FOLLOW-UP  INTERVENTION: -Provided taste change nutrition education  -Continue nocturnal feeds (10pm-8am) of Promote at 70 ml/hr. Tube feeding regimen provides 840 kcal (42% est kcal needs), 53 gram protein (59% est protein needs), and 705 ml free water. Flush tube with 60 ml before and after feeding. -Continue Ensure Complete po TID, each supplement provides 350 kcal and 13 grams of protein -Provide HS snack daily (Milkshake) -May need to increase TF if patient is unable to meet the rest of his estimated nutritional needs from PO. -Encourage PO and supplemental intake -RD to continue to monitor  NUTRITION DIAGNOSIS: Inadequate oral intake related to taste changes as evidenced by PO intake < 75%.  Goal: Pt to meet >/= 90% of their estimated nutrition needs   Monitor:  TF regimen & tolerance, PO intake, weight, labs, I/O's  Admitting Dx: Pulmonary embolism  ASSESSMENT: 69 y.o. male with PMH significant for DM, HLD, mixed type COPD; duodenal bleeding ulcer (status post surgery), protein calorie malnutrition (on enteral feeding) and lung cancer (actively receiving chemo); presented to ED after falling at home.  RD spoke with RN, pt unsure if he received his milkshake last night. RN stated that pt consumed a whole Ensure this morning with his meds.   Spoke to pt and pt's wife at bedside. Pt's wife brought pt a strawberry milkshake to drink, pt consumed >50% of milkshake.  Provided taste change nutrition education, pt's wife seemed appreciative of information provided.  RD to continue to monitor PO intake.   Labs reviewed: Low Mg   Height: Ht Readings from Last 1 Encounters:  01/06/15 5\' 8"  (1.727 m)    Weight: Wt Readings from Last 1 Encounters:  01/09/15 144 lb (65.318 kg)    BMI:  Body mass index is 21.9 kg/(m^2).  Estimated Nutritional Needs: Kcal: 2000-2200 Protein: 90-100g Fluid: 2L/day  Skin: intact  Diet Order: Diet Carb Modified  EDUCATION  NEEDS: -Education needs addressed   Intake/Output Summary (Last 24 hours) at 01/09/15 1216 Last data filed at 01/09/15 1000  Gross per 24 hour  Intake    120 ml  Output   1350 ml  Net  -1230 ml    Last BM: 1/25  Labs:   Recent Labs Lab 01/06/15 1317 01/06/15 2207 01/07/15 0106  NA 132*  --  136  K 3.6  --  3.6  CL 98  --  103  CO2 24  --  24  BUN 18  --  12  CREATININE 0.70  --  0.66  CALCIUM 8.7  --  8.4  MG  --  1.4*  --   PHOS  --  3.9  --   GLUCOSE 113*  --  101*    CBG (last 3)   Recent Labs  01/08/15 2126 01/09/15 0759 01/09/15 1145  GLUCAP 81 160* 218*    Scheduled Meds: . atorvastatin  80 mg Oral Daily  . budesonide (PULMICORT) nebulizer solution  0.25 mg Nebulization BID  . calcium citrate  1 tablet Oral Daily  . DULoxetine  20 mg Oral Daily  . enoxaparin (LOVENOX) injection  1 mg/kg Subcutaneous Q12H  . feeding supplement (ENSURE COMPLETE)  237 mL Oral TID WC  . feeding supplement (PROMOTE)  1,000 mL Per Tube QHS  . fluticasone  2 spray Each Nare Daily  . glimepiride  2 mg Oral Q breakfast  . insulin aspart  0-9 Units Subcutaneous TID WC  . insulin glargine  10 Units Subcutaneous QHS  . loratadine  10 mg Oral Daily  . multivitamins with iron  1 tablet Oral QPC supper  . pantoprazole (PROTONIX) IV  40 mg Intravenous Q12H  . potassium chloride SA  20 mEq Oral Daily  . saccharomyces boulardii  250 mg Oral BID    Continuous Infusions:     Clayton Bibles, MS, RD, LDN Pager: [phone] After Hours Pager: [phone]

## 2015-01-09 NOTE — Progress Notes (Signed)
ANTICOAGULATION CONSULT NOTE - Follow Up Consult  Pharmacy Consult for Lovenox Indication: pulmonary embolus  No Known Allergies  Patient Measurements: Height: 5\' 8"  (172.7 cm) Weight: 144 lb (65.318 kg) IBW/kg (Calculated) : 68.4  Vital Signs: Temp: 98.6 F (37 C) (01/26 0419) Temp Source: Oral (01/26 0419) BP: 102/67 mmHg (01/26 0419) Pulse Rate: 95 (01/26 0419)  Labs:  Recent Labs  01/06/15 1317 01/06/15 1612 01/07/15 0106  01/07/15 1649 01/08/15 0040 01/08/15 0735 01/09/15 0429  HGB 10.1*  --  9.9*  --   --  8.8*  --  8.7*  HCT 31.6*  --  31.0*  --   --  27.7*  --  26.9*  PLT 224  --  220  --   --  190  --  205  APTT  --  43*  --   --   --   --   --   --   LABPROT  --  15.9*  --   --   --   --   --   --   INR  --  1.26  --   --   --   --   --   --   HEPARINUNFRC  --   --  <0.10*  < > 0.74* 0.44 0.36  --   CREATININE 0.70  --  0.66  --   --   --   --   --   < > = values in this interval not displayed.  Estimated Creatinine Clearance: 82.8 mL/min (by C-G formula based on Cr of 0.66).   Assessment: 76 y/oM with PMH of lung cancer, DM, hypercholesteremia, anemia, bleeding duodenal ulcer s/p ex lap/oversewing of posterior duodenum 11/30/14 who presents s/p fall in the bathroom at home. He has a small abrasion where he hit his head. CT of head shows no acute posttraumatic findings or evidence of metastatic disease. CT angio positive for LLL PE. Pharmacy consulted to dose heparin drip initially, but changed to Lovenox in anticipation of discharge.  Significant events: 1/23 Heparin started 1/25 Heparin changed to Lovenox.   Today, 01/09/2015:  CBC: Hgb 8.7, Plt 205  SCr 0.66, CrCl ~ 84 ml/min   Goal of Therapy:  Heparin level 0.3-0.7 units/ml Monitor platelets by anticoagulation protocol: Yes   Plan:   Continue Lovenox 65mg  (1 mg/kg) SQ q12 hours inpatient.  Continue to follow up renal function, CBC, and monitor closely for bleeding.  For discharge,  recommend ONCE daily Lovenox 1.5 mg/kg injections.  Lovenox 100 mg SQ q24h (start 12 hours after the last q12h dose given inpatient).   Gretta Arab PharmD, BCPS Pager [phone] 01/09/2015 1:01 PM

## 2015-01-09 NOTE — Plan of Care (Signed)
Problem: Phase III Progression Outcomes Goal: Activity at appropriate level-compared to baseline (UP IN CHAIR FOR HEMODIALYSIS)  Outcome: Progressing Weened patient from 4 Liters to no oxygen. Oxygen saturation on room air are 94%.

## 2015-01-09 NOTE — Evaluation (Signed)
Physical Therapy Evaluation Patient Details Name: Daryl Vega MRN: [id number] DOB: [birth year] Today's Date: 01/09/2015   History of Present Illness  69 yo male with history of  lung cancer, admitted 01/06/15 with SOB. positive for PE.   Clinical Impression  Patient's RN stated to ambulate on RA and check sats. After 200', sats 90, possibly lower as there was a delay  In monitor picking up, returned to 95% quicxkly. Patuient will benefit from PT to address problems listed in note. Encouraged pursed lip breaths.    Follow Up Recommendations Home health PT;Supervision/Assistance - 24 hour    Equipment Recommendations  None recommended by PT    Recommendations for Other Services       Precautions / Restrictions Precautions Precautions: Fall Precaution Comments: J tube, monitor sats on RA      Mobility  Bed Mobility   Bed Mobility: Supine to Sit   Sidelying to sit: Mod assist       General bed mobility comments: assist with trunk into upright, no assist back into bed.  Transfers Overall transfer level: Needs assistance Equipment used: Rolling walker (2 wheeled) Transfers: Sit to/from Stand Sit to Stand: Min assist         General transfer comment: cues for hand placement,  assist for lowering tobed  Ambulation/Gait Ambulation/Gait assistance: Min assist Ambulation Distance (Feet): 120 Feet (then200') Assistive device: Rolling walker (2 wheeled) Gait Pattern/deviations: Step-to pattern;Step-through pattern Gait velocity: decr   General Gait Details: unsteady gait requiring occasional assist,   Stairs            Wheelchair Mobility    Modified Rankin (Stroke Patients Only)       Balance           Standing balance support: No upper extremity supported;During functional activity Standing balance-Leahy Scale: Poor                               Pertinent Vitals/Pain Pain Assessment: Faces Faces Pain Scale: Hurts little  more Pain Location: chest Pain Descriptors / Indicators: Discomfort Pain Intervention(s): Limited activity within patient's tolerance;Monitored during session    Home Living Family/patient expects to be discharged to:: Private residence Living Arrangements: Spouse/significant other Available Help at Discharge: Family Type of Home: Mobile home Home Access: Stairs to enter Entrance Stairs-Rails: Right Entrance Stairs-Number of Steps: 4 Home Layout: One level Home Equipment: Cane - single point;Wheelchair - [employer] - 2 wheels      Prior Function Level of Independence: Needs assistance   Gait / Transfers Assistance Needed: walks w/ RW     Comments: Pt reports hx of falls at home     Hand Dominance        Extremity/Trunk Assessment   Upper Extremity Assessment: Generalized weakness           Lower Extremity Assessment: Generalized weakness      Cervical / Trunk Assessment: Normal  Communication      Cognition Arousal/Alertness: Awake/alert Behavior During Therapy: Flat affect Overall Cognitive Status: Within Functional Limits for tasks assessed                      General Comments      Exercises        Assessment/Plan    PT Assessment Patient needs continued PT services  PT Diagnosis Difficulty walking;Generalized weakness   PT Problem List Decreased strength;Decreased range of motion;Decreased activity tolerance;Decreased mobility;Decreased knowledge of  use of DME;Pain  PT Treatment Interventions DME instruction;Gait training;Functional mobility training;Therapeutic activities;Therapeutic exercise;Patient/family education   PT Goals (Current goals can be found in the Care Plan section) Acute Rehab PT Goals Patient Stated Goal: to walk to the elevators PT Goal Formulation: With patient/family Time For Goal Achievement: 01/22/15 Potential to Achieve Goals: Good    Frequency Min 3X/week   Barriers to discharge        Co-evaluation                End of Session Equipment Utilized During Treatment: Gait belt Activity Tolerance: Patient limited by fatigue Patient left: in bed;with call bell/phone within reach;with family/visitor present Nurse Communication: Mobility status (sats)         Time: 0786-7544 PT Time Calculation (min) (ACUTE ONLY): 19 min   Charges:   PT Evaluation $Initial PT Evaluation Tier I: 1 Procedure PT Treatments $Gait Training: 8-22 mins   PT G Codes:        Claretha Cooper 01/09/2015, 3:31 PM Tresa Endo PT [phone]

## 2015-01-10 DIAGNOSIS — I2699 Other pulmonary embolism without acute cor pulmonale: Principal | ICD-10-CM

## 2015-01-10 DIAGNOSIS — Y92009 Unspecified place in unspecified non-institutional (private) residence as the place of occurrence of the external cause: Secondary | ICD-10-CM

## 2015-01-10 DIAGNOSIS — E46 Unspecified protein-calorie malnutrition: Secondary | ICD-10-CM

## 2015-01-10 DIAGNOSIS — W19XXXA Unspecified fall, initial encounter: Secondary | ICD-10-CM

## 2015-01-10 DIAGNOSIS — D649 Anemia, unspecified: Secondary | ICD-10-CM

## 2015-01-10 DIAGNOSIS — C3411 Malignant neoplasm of upper lobe, right bronchus or lung: Secondary | ICD-10-CM

## 2015-01-10 LAB — BASIC METABOLIC PANEL
Anion gap: 9 (ref 5–15)
BUN: 9 mg/dL (ref 6–23)
CALCIUM: 8.7 mg/dL (ref 8.4–10.5)
CHLORIDE: 101 mmol/L (ref 96–112)
CO2: 26 mmol/L (ref 19–32)
CREATININE: 0.58 mg/dL (ref 0.50–1.35)
GFR calc Af Amer: >90 mL/min (ref >90)
GFR calc non Af Amer: >90 mL/min (ref >90)
GLUCOSE: 164 mg/dL — AB (ref 70–99)
Potassium: 3.5 mmol/L (ref 3.5–5.1)
Sodium: 136 mmol/L (ref 135–145)

## 2015-01-10 LAB — GLUCOSE, CAPILLARY
GLUCOSE-CAPILLARY: 91 mg/dL (ref 70–99)
GLUCOSE-CAPILLARY: 146 mg/dL — AB (ref 70–99)
GLUCOSE-CAPILLARY: 149 mg/dL — AB (ref 70–99)
Glucose-Capillary: 151 mg/dL — ABNORMAL HIGH (ref 70–99)
Glucose-Capillary: 106 mg/dL — ABNORMAL HIGH (ref 70–99)

## 2015-01-10 LAB — CBC
HCT: 29.6 % — ABNORMAL LOW (ref 39.0–52.0)
Hemoglobin: 9.3 g/dL — ABNORMAL LOW (ref 13.0–17.0)
MCH: 27.5 pg (ref 26.0–34.0)
MCHC: 31.4 g/dL (ref 30.0–36.0)
MCV: 87.6 fL (ref 78.0–100.0)
Platelets: 206 10*3/uL (ref 150–400)
RBC: 3.38 MIL/uL — ABNORMAL LOW (ref 4.22–5.81)
RDW: 16.3 % — ABNORMAL HIGH (ref 11.5–15.5)
WBC: 5.5 10*3/uL (ref 4.0–10.5)

## 2015-01-10 LAB — PREALBUMIN: Prealbumin: 11.1 mg/dL — ABNORMAL LOW (ref 17.0–34.0)

## 2015-01-10 MED ORDER — OXYCODONE HCL 5 MG PO TABS
15.0000 mg | ORAL_TABLET | ORAL | Status: DC | PRN
  Administered 2015-01-10 – 2015-01-11 (×3): 15 mg via ORAL
  Filled 2015-01-10 (×4): qty 3

## 2015-01-10 NOTE — Progress Notes (Signed)
[facility] PROGRESS NOTE  Daryl Vega FIE:[id number] DOB: 1946-05-26 DOA: 01/06/2015 PCP: [name], MD  Summary I have seen and examined Daryl Vega at bedside in the presence of his wife and reviewed his chart. Appreciate GI/oncology/general surgery. Daryl Vega is a 69 y.o. male with PMH significant for DM, HLD, mixed type COPD; duodenal bleeding ulcer (status post surgery), protein calorie malnutrition (on enteral feeding) and lung cancer (actively receiving chemo); who presented to ED after falling at home, with reports of mild SOB and CT-angio showed left lung PE. Patient is now on Lovenox. He is seems to be oxygenating adequately on room air. He will DC on Lovenox in the next 24 hours if he continues to do well. Plan: 1-Acute Pulmonary embolism left lower lobe: most likely associated with increase viscosity from lug cancer. -continue with lovenox which he'll be discharged on.  -Continue PRN oxygen supplementation and assess home oxygen needs -close follow up of his Hgb and any signs of bleeding, as patient recently had surgery and duodenal bleeding ulcer. -PRN analgesics. -try to titrate oxygen off as possible.  Incentive spirometry.   2-COPD mixed type: no wheezing -continue home nebulizer/inhaler treatments  3-Lung cancer: per oncology service  4-Diabetes mellitus:  -continue hold farxiga and metformin -continue SSI and low dose lantus -A1C 5.7  5-Hyperlipidemia: continue statins  6-Anemia of chronic disease and associated with chemotherapy/recent bleeding: Hgb 8.5-9.0 at baseline -will follow Hgb trend; most recent one 9.3 -no signs of acute bleeding currently -continue with protonix.   7-Protein-calorie malnutrition, severe:  -continue enteral feeding and low carb diet -nutritional service consulted; will follow recommendations.  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  -has appointment with  Dr. Laney Potash on 1/27 -general surgery appreciated.   9-Bleeding duodenal ulcer s/p ex laparoscopy 11/30/2014:  -continue PPI and follow Hgb trend -had BM and no blood was noted  10-allergic rhinitis: Claritin and fluticasone.  Code Status: DNR Family Communication: care discussed with patient.  Disposition Plan: Likely DC home in a.m.    Consultants:  Oncology service  General surgery   GI  Procedures:  See below for x-ray reports  Antibiotics:  None  HPI/Subjective: Complains of pain all over  Objective: Filed Vitals:   01/10/15 1500  BP: 106/66  Pulse: 102  Temp: 98.7 F (37.1 C)  Resp: 18    Intake/Output Summary (Last 24 hours) at 01/10/15 1907 Last data filed at 01/10/15 1500  Gross per 24 hour  Intake 619.33 ml  Output   1725 ml  Net -1105.67 ml   Filed Weights   01/06/15 1914 01/09/15 0554 01/10/15 0500  Weight: 66.225 kg (146 lb) 65.318 kg (144 lb) 64.003 kg (141 lb 1.6 oz)    Exam:   General:  Lying comfortably in bed. Not in distress.  Cardiovascular: S1-S2 normal. No murmurs.  Respiratory: Lungs clear.  Abdomen: G-tube in place.  Musculoskeletal: No pedal edema.   Data Reviewed: Basic Metabolic Panel:  Recent Labs Lab 01/06/15 1317 01/06/15 2207 01/07/15 0106 01/09/15 0429 01/10/15 0516  NA 132*  --  136 137 136  K 3.6  --  3.6 3.5 3.5  CL 98  --  103 104 101  CO2 24  --  24 27 26   GLUCOSE 113*  --  101* 179* 164*  BUN 18  --  12 9 9   CREATININE 0.70  --  0.66 0.56 0.58  CALCIUM 8.7  --  8.4 8.2* 8.7  MG  --  1.4*  --   --   --   PHOS  --  3.9  --   --   --    Liver Function Tests:  Recent Labs Lab 01/06/15 1317 01/06/15 2207  AST 22 21  ALT 19 20  ALKPHOS 173* 177*  BILITOT 1.3* 1.2  PROT 6.9 6.9  ALBUMIN 3.0* 2.8*   No results for input(s): LIPASE, AMYLASE in the last 168 hours. No results for input(s): AMMONIA in the last 168 hours. CBC:  Recent Labs Lab 01/06/15 1317 01/07/15 0106  01/08/15 0040 01/09/15 0429 01/10/15 0516  WBC 12.5* 9.3 6.3 4.6 5.5  NEUTROABS 9.1*  --   --   --   --   HGB 10.1* 9.9* 8.8* 8.7* 9.3*  HCT 31.6* 31.0* 27.7* 26.9* 29.6*  MCV 86.8 87.8 87.7 88.5 87.6  PLT 224 220 190 205 206   Cardiac Enzymes: No results for input(s): CKTOTAL, CKMB, CKMBINDEX, TROPONINI in the last 168 hours. BNP (last 3 results) No results for input(s): PROBNP in the last 8760 hours. CBG:  Recent Labs Lab 01/09/15 1703 01/09/15 2143 01/10/15 0800 01/10/15 1215 01/10/15 1704  GLUCAP 127* 91 151* 146* 149*    No results found for this or any previous visit (from the past 240 hour(s)).   Studies: No results found.  Scheduled Meds: . atorvastatin  80 mg Oral Daily  . budesonide (PULMICORT) nebulizer solution  0.25 mg Nebulization BID  . calcium citrate  1 tablet Oral Daily  . DULoxetine  20 mg Oral Daily  . enoxaparin (LOVENOX) injection  1 mg/kg Subcutaneous Q12H  . feeding supplement (ENSURE COMPLETE)  237 mL Oral TID WC  . feeding supplement (PROMOTE)  1,000 mL Per Tube QHS  . fluticasone  2 spray Each Nare Daily  . glimepiride  2 mg Oral Q breakfast  . insulin aspart  0-9 Units Subcutaneous TID WC  . insulin glargine  10 Units Subcutaneous QHS  . loratadine  10 mg Oral Daily  . multivitamins with iron  1 tablet Oral QPC supper  . pantoprazole (PROTONIX) IV  40 mg Intravenous Q12H  . potassium chloride SA  20 mEq Oral Daily  . 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 If 7PM-7AM, please contact night-coverage at [email or web address],  password [facility] 01/10/2015, 7:07 PM  LOS: 4 days

## 2015-01-10 NOTE — Progress Notes (Signed)
[place]   DOB:07/29/1946   BM#:[id number]   UUV#:[id number]  Patient Care Team: [name], MD as PCP - General (Family Medicine)  Subjective: Daryl Vega is a 69 year old man with a history of stage IIIB non-small cell lung cancer, after a recent hospitalization for C. Difficile colitis, admitted on 01/06/2015 after suffering a fall at home. No head trauma was reported, loss of consciousness or seizure activity. At the emergency department, the patient was found to have low grade temperature, tachycardia and hypoxia. He had mild pleuritic chest discomfort. He denied lower extremity swelling. Denied nausea, heartburn or change in bowel habits. He is on tube feeding. He denied any abnormal skin rashes, or neuropathy. No bleeding issues such as epistaxis, hematemesis, hematuria or hematochezia were reported. He is very weak. A CT of the head was negative for acute findings. A CT angiogram on 1/23 however, was positive for acute segmental pulmonary embolism within the left lower lobe pulmonary artery. He was placed on oxygen, IV heparin and IV fluids, with stabilization of his vital signs. His respiratory status improved. No bleeding issues or abnormal labs are present. We have been notified of the patient's admission.  DIAGNOSIS: Stage IIIB (T3, N3, M0) non-small cell lung cancer, squamous cell carcinoma presenting with right upper lobe obstructing mass as well as mediastinal and right supraclavicular lymphadenopathy diagnosed in August of 2015.  PRIOR THERAPY: status post palliative radiotherapy to the obstructing lung mass in the right upper lobe under the care of Dr. [name] completed on 08/23/2014.  CURRENT THERAPY: 1) systemic chemotherapy with carboplatin for AUC of 5 on day 1 and Abraxane 100 mg/M2 on days 1, 8 and 15 every 3 weeks. First dose 08/30/2014. Status post 5 cycles, Last on 12/02/2014   Scheduled Meds: . atorvastatin  80 mg Oral Daily  . budesonide (PULMICORT) nebulizer  solution  0.25 mg Nebulization BID  . calcium citrate  1 tablet Oral Daily  . DULoxetine  20 mg Oral Daily  . enoxaparin (LOVENOX) injection  1 mg/kg Subcutaneous Q12H  . feeding supplement (ENSURE COMPLETE)  237 mL Oral TID WC  . feeding supplement (PROMOTE)  1,000 mL Per Tube QHS  . fluticasone  2 spray Each Nare Daily  . glimepiride  2 mg Oral Q breakfast  . insulin aspart  0-9 Units Subcutaneous TID WC  . insulin glargine  10 Units Subcutaneous QHS  . loratadine  10 mg Oral Daily  . multivitamins with iron  1 tablet Oral QPC supper  . pantoprazole (PROTONIX) IV  40 mg Intravenous Q12H  . potassium chloride SA  20 mEq Oral Daily  . saccharomyces boulardii  250 mg Oral BID   Continuous Infusions:  PRN Meds:acetaminophen **OR** acetaminophen, bismuth subsalicylate, chlorpheniramine-HYDROcodone, HYDROmorphone (DILAUDID) injection, LORazepam, ondansetron **OR** ondansetron (ZOFRAN) IV, oxyCODONE, temazepam   Objective:  Filed Vitals:   01/10/15 0636  BP: 103/65  Pulse: 102  Temp: 98.6 F (37 C)  Resp: 20      Intake/Output Summary (Last 24 hours) at 01/10/15 0714 Last data filed at 01/10/15 0659  Gross per 24 hour  Intake 619.33 ml  Output    925 ml  Net -305.67 ml    ECOG PERFORMANCE STATUS:  GENERAL:Ill appearing, uncomfortable, able to interact and follow commands. SKIN: skin color, texture, turgor are normal, no rashes or significant lesions EYES: normal, conjunctiva are pink and non-injected, sclera clear OROPHARYNX:no exudate, no erythema and lips, buccal mucosa, and tongue normal. Patient is edentulous NECK: supple, thyroid normal size,  non-tender, without nodularity LYMPH:  no palpable lymphadenopathy in the cervical, axillary or inguinal LUNGS: Essentially clear to auscultation and percussion with normal breathing effort HEART: regular rate & rhythm and no murmurs and no lower extremity edema ABDOMEN:abdomen soft, non-tender and normal bowel sounds. Patient has  a J-tube for feedings. His midline wound is care of by the wound specialists Musculoskeletal:no cyanosis of digits and no clubbing   PSYCH: alert & oriented x 3 with fluent speech NEURO: no focal motor/sensory deficits    CBG (last 3)   Recent Labs  01/09/15 1145 01/09/15 1703 01/09/15 2143  GLUCAP 218* 127* 91     Labs:   Recent Labs Lab 01/06/15 1317 01/07/15 0106 01/08/15 0040 01/09/15 0429 01/10/15 0516  WBC 12.5* 9.3 6.3 4.6 5.5  HGB 10.1* 9.9* 8.8* 8.7* 9.3*  HCT 31.6* 31.0* 27.7* 26.9* 29.6*  PLT 224 220 190 205 206  MCV 86.8 87.8 87.7 88.5 87.6  MCH 27.7 28.0 27.8 28.6 27.5  MCHC 32.0 31.9 31.8 32.3 31.4  RDW 16.5* 16.6* 16.5* 16.3* 16.3*  LYMPHSABS 1.6  --   --   --   --   MONOABS 1.8*  --   --   --   --   EOSABS 0.0  --   --   --   --   BASOSABS 0.0  --   --   --   --      Chemistries:    Recent Labs Lab 01/06/15 1317 01/06/15 2207 01/07/15 0106 01/09/15 0429 01/10/15 0516  NA 132*  --  136 137 136  K 3.6  --  3.6 3.5 3.5  CL 98  --  103 104 101  CO2 24  --  24 27 26   GLUCOSE 113*  --  101* 179* 164*  BUN 18  --  12 9 9   CREATININE 0.70  --  0.66 0.56 0.58  CALCIUM 8.7  --  8.4 8.2* 8.7  MG  --  1.4*  --   --   --   AST 22 21  --   --   --   ALT 19 20  --   --   --   ALKPHOS 173* 177*  --   --   --   BILITOT 1.3* 1.2  --   --   --     GFR Estimated Creatinine Clearance: 81.1 mL/min (by C-G formula based on Cr of 0.58).  Liver Function Tests:  Recent Labs Lab 01/06/15 1317 01/06/15 2207  AST 22 21  ALT 19 20  ALKPHOS 173* 177*  BILITOT 1.3* 1.2  PROT 6.9 6.9  ALBUMIN 3.0* 2.8*    Coagulation profile  Recent Labs Lab 01/06/15 1612  INR 1.26     Recent Labs Lab 01/08/15 2126 01/09/15 0759 01/09/15 1145 01/09/15 1703 01/09/15 2143  GLUCAP 81 160* 218* 127* 91       Imaging Studies:  Dg Chest 2 View  01/06/2015  COMPARISON: 12/05/2014 and 12/04/2014 radiographs. Chest CT 10/22/2014. FINDINGS: The PICC  and nasogastric tube have been removed. There is interval improved aeration of both lung bases with minimal residual patchy atelectasis or infiltrates. Right upper lobe scarring is stable. There is no significant pleural effusion. The heart size and mediastinal contours are stable. IMPRESSION: Interval improvement in bibasilar airspace opacities compared with prior examination of 1 month ago. Residual densities may reflect atelectasis or resolving inflammation. No new findings. Electronically Signed By: Camie Patience M.D. On: 01/06/2015 12:52  Ct Head Wo Contrast  01/06/2015 COMPARISON: Head CT 10/28/2014. MRI brain 08/01/2014. FINDINGS: There is no evidence of acute intracranial hemorrhage, mass lesion, brain edema or extra-axial fluid collection. The ventricles and subarachnoid spaces are appropriately sized for age. There is no CT evidence of acute cortical infarction. Mild intracranial vascular calcifications are noted. The visualized paranasal sinuses, mastoid air cells and middle ears are clear. The calvarium is intact. IMPRESSION: Stable normal appearance of the brain. No acute posttraumatic findings or evidence of metastatic disease. Electronically Signed By: Camie Patience M.D. On: 01/06/2015 13:07   Ct Angio Chest Pe W/cm &/or Wo Cm  01/06/2015 COMPARISON: Radiographs today. Chest CT 10/22/2014. FINDINGS: Vascular: The pulmonary arteries are well opacified with contrast. There is definite acute segmental pulmonary embolism within the left lower lobe pulmonary artery. Additional smaller foci of embolic disease are present on the left. No definite right-sided emboli demonstrated. There is no involvement of the central pulmonary arteries or right ventricle. There are no signs of right heart strain. There is stable relatively mild atherosclerosis of the aorta, great vessels and coronary arteries. The heart size is normal. A small pericardial effusion appears unchanged.  Mediastinum: No discretely enlarged mediastinal or hilar lymph nodes are identified. There is stable soft tissue fullness in the right hilum with associated central airway narrowing attributed to prior radiation therapy. The thyroid gland, trachea and esophagus demonstrate no significant findings. Lungs/Pleura: Small to moderate right-greater-than-left pleural effusions have enlarged compared with the prior study. Left-greater-than-right basilar airspace opacities have also progressed. There is minimal no opacity anteriorly in the right middle lobe. The additional paramediastinal opacities in the right lung are stable and attributed to radiation therapy. No focal pulmonary nodules identified. Upper abdomen: Distal esophageal wall thickening appears improved. The visualized liver appears unremarkable. The adrenal glands are not imaged. Musculoskeletal/Chest wall: No chest wall lesion or acute osseous findings.No worrisome osseous findings. Review of the MIP images confirms the above findings. IMPRESSION: 1. Study is positive for acute segmental pulmonary embolism within the left lower lobe. No signs of right heart strain. 2. Interval enlargement of bilateral pleural effusions with worsening bibasilar airspace opacities. The latter could be secondary to atelectasis, inflammation or pulmonary infarction. 3. Otherwise stable right upper lobe radiation changes. No evidence of metastatic disease. 4. Critical Value/emergent results were called by telephone at the time of interpretation on 01/06/2015 at 2:48 pm to Dr. Sherwood Gambler , who verbally acknowledged these results. Electronically Signed By: Camie Patience M.D. On: 01/06/2015 14:48  Assessment/Plan: 69 y.o.   Stage IIIB non-small cell lung cancer  currently undergoing systemic chemotherapy was carboplatin and Abraxane status post 5 cycles. Systemic chemotherapy is on hold for now until improvement in his condition.  Left lower lobe pulmonary  embolism In the setting of malignancy, decreased mobility He was initially placed on heparin, then switched to Lovenox on 01/08/2015 Upon discharge, he will be receiving a prescription for Lovenox 100 mg subcutaneous every 24 hours, as recommended by pharmacy  Anemia Due to malignancy, malnutrition, dilution, recent hospitalization for bleeding duodenal ulcer and medications No transfusion is indicated at this time Monitor counts closely Transfuse blood to maintain a Hb of 8 g or if the patient is acutely bleeding  Malnutrition Appreciate Nutrition follow up He is currently on tube feedings at 70 mL an hour He is soon to have his tube feedings decreased to allow increasing oral intake  DO NOT RESUSCITATE  Other medical issues including Diabetes, pain control, tube feeding care, COPD, hyperlipidemia  as per admitting and specialty team     **Disclaimer: This note was dictated with voice recognition software. Similar sounding words can inadvertently be transcribed and this note may contain transcription errors which may not have been corrected upon publication of note.Sharene Butters E, PA-C 01/10/2015  7:14 AM  Addendum: Hematology/Oncology Attending: The patient is seen and examined today. I agree with the above note. He will very pleasant 69 years old white male with history of stage IIIB non-small cell lung cancer status post 5 cycles of carboplatin and Abraxane. He tolerated his treatment fairly well but this was discontinued after the patient was admitted with perforated duodenal ulcer. He continues to have persistent anemia secondary to the bleeding duodenal ulcer and previous chemotherapy. He was recently admitted with left lower lobe pulmonary embolism. He is currently on IV heparin infusion and switched to Lovenox 2 days ago. My preference for anticoagulation will be subcutaneous Lovenox, but Xarelto  is another option with close monitoring. I will arrange for the patient a  follow-up appointment with me in few weeks after his discharge. Thank you so much for taking good care of Daryl Vega. Please call if you have any questions.

## 2015-01-10 NOTE — Progress Notes (Signed)
  Subjective: He says he's ready to go home.  Not eating much at home, still on tube feeds.  Objective: Vital signs in last 24 hours: Temp:  [98.6 F (37 C)-99.2 F (37.3 C)] 98.6 F (37 C) (01/27 0636) Pulse Rate:  [97-105] 102 (01/27 0636) Resp:  [18-20] 20 (01/27 0636) BP: (103-106)/(64-66) 103/65 mmHg (01/27 0636) SpO2:  [91 %-94 %] 94 % (01/27 0636) Weight:  [64.003 kg (141 lb 1.6 oz)] 64.003 kg (141 lb 1.6 oz) (01/27 0500) Last BM Date: 01/08/15 120 PO 1 BM recorded Carb modified diet Afebrile, still tachycardic, BP stable. Labs OK Intake/Output from previous day: 01/26 0701 - 01/27 0700 In: 619.3 [P.O.:120; NG/GT:499.3] Out: 925 [Urine:925] Intake/Output this shift:    General appearance: alert, cooperative, appears older than stated age, fatigued and no distress GI: open wound is almost completely healed.    Lab Results:   Recent Labs  01/09/15 0429 01/10/15 0516  WBC 4.6 5.5  HGB 8.7* 9.3*  HCT 26.9* 29.6*  PLT 205 206    BMET  Recent Labs  01/09/15 0429 01/10/15 0516  NA 137 136  K 3.5 3.5  CL 104 101  CO2 27 26  GLUCOSE 179* 164*  BUN 9 9  CREATININE 0.56 0.58  CALCIUM 8.2* 8.7   PT/INR No results for input(s): LABPROT, INR in the last 72 hours.   Recent Labs Lab 01/06/15 1317 01/06/15 2207  AST 22 21  ALT 19 20  ALKPHOS 173* 177*  BILITOT 1.3* 1.2  PROT 6.9 6.9  ALBUMIN 3.0* 2.8*     Lipase     Component Value Date/Time   LIPASE 41 11/29/2014 0644     Studies/Results: No results found.  Medications: . atorvastatin  80 mg Oral Daily  . budesonide (PULMICORT) nebulizer solution  0.25 mg Nebulization BID  . calcium citrate  1 tablet Oral Daily  . DULoxetine  20 mg Oral Daily  . enoxaparin (LOVENOX) injection  1 mg/kg Subcutaneous Q12H  . feeding supplement (ENSURE COMPLETE)  237 mL Oral TID WC  . feeding supplement (PROMOTE)  1,000 mL Per Tube QHS  . fluticasone  2 spray Each Nare Daily  . glimepiride  2 mg Oral Q  breakfast  . insulin aspart  0-9 Units Subcutaneous TID WC  . insulin glargine  10 Units Subcutaneous QHS  . loratadine  10 mg Oral Daily  . multivitamins with iron  1 tablet Oral QPC supper  . pantoprazole (PROTONIX) IV  40 mg Intravenous Q12H  . potassium chloride SA  20 mEq Oral Daily  . saccharomyces boulardii  250 mg Oral BID    Assessment/Plan 1. Bleeding duodenal ulcer with cholecystoduodenal fistula --EGD for control of bleeding - 11/30/16 - [name] --Ex Lap, oversewing of posterior bleeding DU, Heineke Mikulicz pyloroplasty, cholecystectomy, repair of right hepatic artery, repair of cholecystoduodenal fistula and placement of duodenostomy tube, feeding jejunostomy - 11/30/14 - Dr. Jackolyn Confer 2. Left pulmonary embolism 3. 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 (07/2014) 4. PCM, severe - on low carb diet 5. Midline wound   Plan:  Continue current Rx.and follow up with [name] in the office after discharge.  Continue dressing change to abdominal would as before.  Call if we can assist.    LOS: 4 days    Daryl Vega 01/10/2015

## 2015-01-11 DIAGNOSIS — K823 Fistula of gallbladder: Secondary | ICD-10-CM

## 2015-01-11 DIAGNOSIS — C3492 Malignant neoplasm of unspecified part of left bronchus or lung: Secondary | ICD-10-CM

## 2015-01-11 DIAGNOSIS — E1169 Type 2 diabetes mellitus with other specified complication: Secondary | ICD-10-CM

## 2015-01-11 DIAGNOSIS — J449 Chronic obstructive pulmonary disease, unspecified: Secondary | ICD-10-CM

## 2015-01-11 DIAGNOSIS — E785 Hyperlipidemia, unspecified: Secondary | ICD-10-CM

## 2015-01-11 DIAGNOSIS — E43 Unspecified severe protein-calorie malnutrition: Secondary | ICD-10-CM

## 2015-01-11 DIAGNOSIS — D509 Iron deficiency anemia, unspecified: Secondary | ICD-10-CM

## 2015-01-11 LAB — GLUCOSE, CAPILLARY: GLUCOSE-CAPILLARY: 215 mg/dL — AB (ref 70–99)

## 2015-01-11 LAB — CBC
HEMATOCRIT: 32.1 % — AB (ref 39.0–52.0)
Hemoglobin: 10.1 g/dL — ABNORMAL LOW (ref 13.0–17.0)
MCH: 27.7 pg (ref 26.0–34.0)
MCHC: 31.5 g/dL (ref 30.0–36.0)
MCV: 88.2 fL (ref 78.0–100.0)
Platelets: 252 10*3/uL (ref 150–400)
RBC: 3.64 MIL/uL — AB (ref 4.22–5.81)
RDW: 16.3 % — ABNORMAL HIGH (ref 11.5–15.5)
WBC: 12.7 10*3/uL — ABNORMAL HIGH (ref 4.0–10.5)

## 2015-01-11 MED ORDER — ENOXAPARIN SODIUM 100 MG/ML ~~LOC~~ SOLN: 100.0000 mg | Freq: Every day | SUBCUTANEOUS | Status: DC

## 2015-01-11 MED ORDER — ENOXAPARIN SODIUM 100 MG/ML ~~LOC~~ SOLN
100.0000 mg | Freq: Every day | SUBCUTANEOUS | Status: DC
  Administered 2015-01-11: 100 mg via SUBCUTANEOUS
  Filled 2015-01-11: qty 1

## 2015-01-11 MED ORDER — DULOXETINE HCL 20 MG PO CPEP: 20.0000 mg | ORAL_CAPSULE | Freq: Every day | ORAL | Status: DC

## 2015-01-11 MED ORDER — BISMUTH SUBSALICYLATE 262 MG/15ML PO SUSP: 30.0000 mL | ORAL | Status: DC | PRN

## 2015-01-11 MED ORDER — PANTOPRAZOLE SODIUM 40 MG PO TBEC: 40.0000 mg | DELAYED_RELEASE_TABLET | Freq: Two times a day (BID) | ORAL | Status: DC

## 2015-01-11 MED ORDER — BUDESONIDE-FORMOTEROL FUMARATE 160-4.5 MCG/ACT IN AERO: INHALATION_SPRAY | RESPIRATORY_TRACT | Status: DC

## 2015-01-11 NOTE — Progress Notes (Signed)
ANTICOAGULATION CONSULT NOTE - Follow Up Consult  Pharmacy Consult for Lovenox Indication: pulmonary embolus  No Known Allergies  Patient Measurements: Height: 5\' 8"  (172.7 cm) Weight: 138 lb 14.2 oz (63 kg) IBW/kg (Calculated) : 68.4  Vital Signs: Temp: 99.4 F (37.4 C) (01/28 0424) Temp Source: Oral (01/28 0424) BP: 107/71 mmHg (01/28 0424) Pulse Rate: 96 (01/28 0424)  Labs:  Recent Labs  01/09/15 0429 01/10/15 0516 01/11/15 0535  HGB 8.7* 9.3* 10.1*  HCT 26.9* 29.6* 32.1*  PLT 205 206 252  CREATININE 0.56 0.58  --     Estimated Creatinine Clearance: 79.8 mL/min (by C-G formula based on Cr of 0.58).   Assessment: 39 y/oM with PMH of lung cancer, DM, hypercholesteremia, anemia, bleeding duodenal ulcer s/p ex lap/oversewing of posterior duodenum 11/30/14 who presents s/p fall in the bathroom at home. He has a small abrasion where he hit his head. CT of head shows no acute posttraumatic findings or evidence of metastatic disease. CT angio positive for LLL PE. Pharmacy consulted to dose heparin drip initially, but changed to Lovenox in anticipation of discharge.  Significant events: 1/23 Heparin started 1/25 Heparin changed to Lovenox.   Today, 01/11/2015:  CBC: Hgb 10, continues to improve. Plt remains WNL.  SCr 0.58, CrCl ~ 80 ml/min   Goal of Therapy:  Heparin level 0.3-0.7 units/ml Monitor platelets by anticoagulation protocol: Yes   Plan:   Change to Lovenox 100 mg (1.5 mg/kg) SQ q24 hours.  Continue to follow up renal function, CBC, and monitor closely for bleeding.   Gretta Arab PharmD, BCPS Pager [phone] 01/11/2015 10:16 AM

## 2015-01-11 NOTE — Progress Notes (Signed)
Key Points: Use following P&T approved IV to PO antibiotic change policy.  Description contains the criteria that are approved Note: Policy Excludes:  Esophagectomy patientsPHARMACIST - PHYSICIAN COMMUNICATION DR:   Rama CONCERNING: IV to Oral Route Change Policy  RECOMMENDATION: This patient is receiving Protonix by the intravenous route.  Based on criteria approved by the Pharmacy and Therapeutics Committee, the intravenous medication(s) is/are being converted to the equivalent oral dose form(s).   DESCRIPTION: These criteria include:  The patient is eating (either orally or via tube) and/or has been taking other orally administered medications for a least 24 hours  The patient has no evidence of active gastrointestinal bleeding or impaired GI absorption (gastrectomy, short bowel, patient on TNA or NPO).  Pt no longer has IV access and RN requests last inpatient dose be given PO.  If you have questions about this conversion, please contact the Pharmacy Department   []   [phone] )  Morris PharmD, [place] Pager [phone] 01/11/2015 12:11 PM

## 2015-01-11 NOTE — Progress Notes (Signed)
Physical Therapy Treatment Patient Details Name: Daryl Vega MRN: [id number] DOB: [birth year] Today's Date: 01/11/2015    History of Present Illness 69 yo male with history of  lung cancer, admitted 01/06/15 with SOB. positive for PE.     PT Comments    Assisted OOB to amb 2 great distances with RW.  Tolerated well.  Mild c/o chest pain which subsided with rest.  Returned to room and assisted to [facility].  Pt able to self perform personal care.  Returned to supine in bed. Pt plans to D/C to home today.  Follow Up Recommendations  Home health PT     Equipment Recommendations  None recommended by PT    Recommendations for Other Services       Precautions / Restrictions Precautions Precautions: Fall Precaution Comments: PEG Restrictions Weight Bearing Restrictions: No    Mobility  Bed Mobility Overal bed mobility: Modified Independent             General bed mobility comments: able to self perform  Transfers Overall transfer level: Needs assistance Equipment used: Rolling walker (2 wheeled) Transfers: Sit to/from Stand Sit to Stand: Min guard;Supervision         General transfer comment: good use of hands and safety cognition  Ambulation/Gait Ambulation/Gait assistance: Supervision;Min guard Ambulation Distance (Feet): 420 Feet (210 feet x 2 one sitting rest break.) Assistive device: Rolling walker (2 wheeled) Gait Pattern/deviations: Step-to pattern Gait velocity: WFL   General Gait Details: good alternating gait with head turns.  No LOB.   Stairs            Wheelchair Mobility    Modified Rankin (Stroke Patients Only)       Balance                                    Cognition Arousal/Alertness: Awake/alert Behavior During Therapy: WFL for tasks assessed/performed Overall Cognitive Status: Within Functional Limits for tasks assessed                      Exercises      General Comments        Pertinent  Vitals/Pain Pain Assessment: Faces Faces Pain Scale: Hurts a little bit Pain Location: chest Pain Descriptors / Indicators: Discomfort Pain Intervention(s): Monitored during session;Repositioned    Home Living                      Prior Function            PT Goals (current goals can now be found in the care plan section) Progress towards PT goals: Progressing toward goals    Frequency  Min 3X/week    PT Plan      Co-evaluation             End of Session Equipment Utilized During Treatment: Gait belt Activity Tolerance: Patient tolerated treatment well Patient left: in bed;with call bell/phone within reach;with family/visitor present     Time: 1030-1055 PT Time Calculation (min) (ACUTE ONLY): 25 min  Charges:  $Gait Training: 8-22 mins $Therapeutic Activity: 8-22 mins                    G Codes:      Rica Koyanagi  PTA [facility]  Acute  Rehab Pager      [phone]

## 2015-01-11 NOTE — Discharge Summary (Signed)
Physician Discharge Summary  Daryl Vega [facility] ZOX:[id number] DOB: 01-02-1946 DOA: 01/06/2015  PCP: [name], MD  Admit date: 01/06/2015 Discharge date: 01/11/2015   Recommendations for Outpatient Follow-Up:   1. The patient will F/U general surgery, GI, oncology and his PCP. 2. Home health RN, PT/OT, aide, and SW set up.   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  Diet recommendation: Tube feeding.   History of Present Illness:   Daryl Vega is an 69 y.o. male with a history of stage IIIB non-small cell lung cancer, duodenal bleeding ulcer (status post exp lap, over sewing of posterior bleeding duodenal ulcer 12/01/15), s/p recent hospitalization for C. Difficile colitis, admitted on 01/06/2015 after suffering a fall at home. A CT of the head was negative for acute findings. A CT angiogram on 01/06/15 was positive for acute segmental pulmonary embolism. He was placed on oxygen, IV heparin and IV fluids, with stabilization of his vital signs. His respiratory status improved.    Hospital Course by Problem:    Principal Problem:  Pulmonary embolism -D/C home on Lovenox 100 mg daily.  -Close follow up of his Hgb and any signs of bleeding, as patient recently had surgery and duodenal bleeding ulcer. -Continue PRN analgesics. - Continue Incentive spirometry.   Active Problems:  COPD mixed type -Continue home nebulizer/inhaler treatments.   Lung cancer - Status post palliative radiotherapy to the obstructing lung mass in the right upper lobe under the care of Dr. [name] completed on 08/23/2014. - Current treatment: systemic  chemotherapy with carboplatin for AUC of 5 on day 1 and Abraxane 100 mg/M2 on days 1, 8 and 15 every 3 weeks. First dose 08/30/2014. Status post 5 cycles, Last on 12/02/2014. - Dr. Julien Nordmann following.   Diabetes mellitus -Resume Amaryl and Metformin at discharge.   Hyperlipidemia -Continue Lipitor.   Anemia -Hemoglobin stable at discharge.   Protein-calorie malnutrition, severe -Continue enteral feeds.   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   Bleeding duodenal ulcer s/p ex lap/oversew 11/30/2014 -Dressing changes per home care RN.  Hemoglobin stable.  Medical Consultants:    Dr. Stark Klein, Surgery.  Dr. Curt Bears, Oncology.   Discharge Exam:   Filed Vitals:   01/11/15 0424  BP: 107/71  Pulse: 96  Temp: 99.4 F (37.4 C)  Resp: 18   Filed Vitals:   01/10/15 2141 01/11/15 0336 01/11/15 0424 01/11/15 0932  BP: 112/74  107/71   Pulse: 103  96   Temp: 98.7 F (37.1 C)  99.4 F (37.4 C)   TempSrc: Oral  Oral   Resp: 20  18   Height:      Weight:  63 kg (138 lb 14.2 oz)    SpO2: 93%  97% 93%    Gen:  NAD Cardiovascular:  RRR, No M/Daryl/G Respiratory: Lungs CTAB Gastrointestinal: Abdomen soft, NT/ND with normal active bowel sounds. Extremities: No C/E/C   The results of significant diagnostics from this hospitalization (including imaging, microbiology, ancillary and laboratory) are listed below for reference.     Procedures and Diagnostic Studies:   Dg Chest 2 View  01/06/2015   CLINICAL DATA:  Shortness of breath with hypoxia. History of right-sided lung cancer. Initial encounter.  EXAM: CHEST  2 VIEW  COMPARISON:  12/05/2014 and 12/04/2014 radiographs. Chest CT 10/22/2014.  FINDINGS: The PICC and nasogastric tube have been removed. There is interval improved aeration of both lung bases with minimal residual patchy atelectasis or infiltrates. Right upper lobe scarring is  stable. There is no significant pleural effusion. The heart size and mediastinal contours are stable.  IMPRESSION: Interval improvement in bibasilar airspace opacities compared with prior examination of 1 month ago. Residual densities may reflect atelectasis or resolving inflammation. No new findings.   Electronically Signed   By: Camie Patience M.D.   On: 01/06/2015 12:52   Ct Head Wo Contrast  01/06/2015   CLINICAL DATA:  Status post fall. History of lung cancer with chemotherapy and radiation therapy. Initial encounter.  EXAM: CT HEAD WITHOUT CONTRAST  TECHNIQUE: Contiguous axial images were obtained from the base of the skull through the vertex without intravenous contrast.  COMPARISON:  Head CT 10/28/2014.  MRI brain 08/01/2014.  FINDINGS: There is no evidence of acute intracranial hemorrhage, mass lesion, brain edema or extra-axial fluid collection. The ventricles and subarachnoid spaces are appropriately sized for age. There is no CT evidence of acute cortical infarction. Mild intracranial vascular calcifications are noted.  The visualized paranasal sinuses, mastoid air cells and middle ears are clear. The calvarium is intact.  IMPRESSION: Stable normal appearance of the brain. No acute posttraumatic findings or evidence of metastatic disease.   Electronically Signed   By: Camie Patience M.D.   On: 01/06/2015 13:07   Ct Angio Chest Pe W/cm &/or Wo Cm  01/06/2015   CLINICAL DATA:  Hypoxemia. History of lung cancer diagnosed 5 months ago status post chemotherapy and radiation therapy. Initial encounter.  EXAM: CT ANGIOGRAPHY CHEST WITH CONTRAST  TECHNIQUE: Multidetector CT imaging of the chest was performed using the standard protocol during bolus administration of intravenous contrast. Multiplanar CT image reconstructions and MIPs were obtained to evaluate the vascular anatomy.  CONTRAST:  152mL OMNIPAQUE IOHEXOL 350 MG/ML SOLN  COMPARISON:  Radiographs today.  Chest CT 10/22/2014.  FINDINGS: Vascular: The  pulmonary arteries are well opacified with contrast. There is definite acute segmental pulmonary embolism within the left lower lobe pulmonary artery. Additional smaller foci of embolic disease are present on the left. No definite right-sided emboli demonstrated. There is no involvement of the central pulmonary arteries or right ventricle. There are no signs of right heart strain. There is stable relatively mild atherosclerosis of the aorta, great vessels and coronary arteries. The heart size is normal. A small pericardial effusion appears unchanged.  Mediastinum: No discretely enlarged mediastinal or hilar lymph nodes are identified. There is stable soft tissue fullness in the right hilum with associated central airway narrowing attributed to prior radiation therapy. The thyroid gland, trachea and esophagus demonstrate no significant findings.  Lungs/Pleura: Small to moderate right-greater-than-left pleural effusions have enlarged compared with the prior study. Left-greater-than-right basilar airspace opacities have also progressed. There is minimal no opacity anteriorly in the right middle lobe. The additional paramediastinal opacities in the right lung are stable and attributed to radiation therapy. No focal pulmonary nodules identified.  Upper abdomen: Distal esophageal wall thickening appears improved. The visualized liver appears unremarkable. The adrenal glands are not imaged.  Musculoskeletal/Chest wall: No chest wall lesion or acute osseous findings.No worrisome osseous findings.  Review of the MIP images confirms the above findings.  IMPRESSION: 1.  Study is positive for acute segmental pulmonary embolism within the left lower lobe. No signs of right heart strain. 2. Interval enlargement of bilateral pleural effusions with worsening bibasilar airspace opacities. The latter could be secondary to atelectasis, inflammation or pulmonary infarction. 3. Otherwise stable right upper lobe radiation changes. No  evidence of metastatic disease. 4. Critical Value/emergent results were called by telephone at the time of interpretation on 01/06/2015 at 2:48 pm to Dr. Sherwood Gambler , who verbally acknowledged these results.   Electronically Signed   By: Camie Patience M.D.   On: 01/06/2015 14:48    Labs:   Basic Metabolic Panel:  Recent Labs Lab 01/06/15 1317 01/06/15 2207 01/07/15 0106 01/09/15 0429 01/10/15 0516  NA 132*  --  136 137 136  K 3.6  --  3.6 3.5 3.5  CL 98  --  103 104 101  CO2 24  --  24 27 26   GLUCOSE 113*  --  101* 179* 164*  BUN 18  --  12 9 9   CREATININE 0.70  --  0.66 0.56 0.58  CALCIUM 8.7  --  8.4 8.2* 8.7  MG  --  1.4*  --   --   --   PHOS  --  3.9  --   --   --    GFR Estimated Creatinine Clearance: 79.8 mL/min (by C-G formula based on Cr of 0.58). Liver Function Tests:  Recent Labs Lab 01/06/15 1317 01/06/15 2207  AST 22 21  ALT 19 20  ALKPHOS 173* 177*  BILITOT 1.3* 1.2  PROT 6.9 6.9  ALBUMIN 3.0* 2.8*   Coagulation profile  Recent Labs Lab 01/06/15 1612  INR 1.26    CBC:  Recent Labs Lab 01/06/15 1317 01/07/15 0106 01/08/15 0040 01/09/15 0429 01/10/15 0516 01/11/15 0535  WBC 12.5* 9.3 6.3 4.6 5.5 12.7*  NEUTROABS 9.1*  --   --   --   --   --   HGB 10.1* 9.9* 8.8* 8.7* 9.3* 10.1*  HCT 31.6* 31.0* 27.7* 26.9* 29.6* 32.1*  MCV 86.8 87.8 87.7 88.5 87.6 88.2  PLT 224 220 190 205 206 252   CBG:  Recent Labs Lab 01/10/15 0800 01/10/15 1215 01/10/15 1704 01/10/15 2139 01/11/15 0755  GLUCAP 151* 146* 149* 106* 215*    Discharge Instructions:   Discharge Instructions    Call MD for:  difficulty breathing, headache or visual disturbances    Complete by:  As directed      Call MD for:  extreme fatigue    Complete by:  As directed      Call MD for:  persistant dizziness or light-headedness    Complete by:  As directed      Call MD for:  severe uncontrolled pain    Complete by:  As directed      Call MD for:  temperature >100.4     Complete by:  As directed      Face-to-face encounter (required for Medicare/Medicaid patients)    Complete by:  As directed   I Bertina Guthridge certify that this patient is under my care and that I, or a nurse practitioner or physician's assistant working with me, had a face-to-face encounter that meets the physician face-to-face encounter requirements with this patient on 01/11/2015. The encounter with the patient was in whole, or in part for the following medical condition(s) which is the primary reason for home health care (List medical condition): Severe deconditioning, s/p hospitalization for pulmonary embolism in the setting of  cancer related cachexia.  The encounter with the patient was in whole, or in part, for the following medical condition, which is the primary reason for home health care:  Pulmonary embolism, cancer, deconditioning  I certify that, based on my findings, the following services are medically necessary home health services:   Nursing Physical therapy    Reason for Medically Necessary Home Health Services:   Skilled Nursing- Changes in Medication/Medication Management Skilled Nursing- Skilled Assessment/Observation Skilled Nursing- Teaching of Disease Process/Symptom Management Therapy- Therapeutic Exercises to Increase Strength and Endurance Therapy- Home Adaptation to Facilitate Safety    My clinical findings support the need for the above services:  Unable to leave home safely without assistance and/or assistive device  Further, I certify that my clinical findings support that this patient is homebound due to:  Unable to leave home safely without assistance     Home Health    Complete by:  As directed   To provide the following care/treatments:   PT [place] work       Increase activity slowly    Complete by:  As directed             Medication List    TAKE these medications        atorvastatin 80 MG tablet  Commonly known as:   LIPITOR  Take 80 mg by mouth daily.     bismuth subsalicylate 295 MW/41LK suspension  Commonly known as:  PEPTO BISMOL  Place 30 mLs into feeding tube every 4 (four) hours as needed for indigestion.     budesonide-formoterol 160-4.5 MCG/ACT inhaler  Commonly known as:  SYMBICORT  Take 2 puffs first thing in am and then another 2 puffs about 12 hours later.     calcium citrate-vitamin D 315-200 MG-UNIT per tablet  Commonly known as:  CITRACAL+D  Take 1 tablet by mouth daily.     Cinnamon 500 MG capsule  Take 500 mg by mouth daily.     DULoxetine 20 MG capsule  Commonly known as:  CYMBALTA  Take 1 capsule (20 mg total) by mouth daily.     enoxaparin 100 MG/ML injection  Commonly known as:  LOVENOX  Inject 1 mL (100 mg total) into the skin daily.     FARXIGA 5 MG Tabs tablet  Generic drug:  dapagliflozin propanediol  Take 5 mg by mouth 2 (two) times daily.     feeding supplement Liqd  Take 237 mLs by mouth 3 (three) times daily with meals.     PROMOTE PO  Give 70 mL/hr by tube at bedtime. Runs from  10 pm to 8 am     glimepiride 4 MG tablet  Commonly known as:  AMARYL  Take 4 mg by mouth 2 (two) times daily. Take before meals     hydrocortisone-pramoxine 2.5-1 % rectal cream  Commonly known as:  ANALPRAM-HC  Place 1 application rectally every 6 (six) hours as needed for hemorrhoids or itching.     LORazepam 0.5 MG tablet  Commonly known as:  ATIVAN  Take 1 table by mouth every 12 hours as needed for anxiety     metFORMIN 500 MG tablet  Commonly known as:  GLUCOPHAGE  Take 500 mg by mouth 2 (two) times daily with a meal.     multivitamins with iron Tabs tablet  Take 1 tablet by mouth daily after supper.     oxyCODONE 5 MG immediate release tablet  Commonly known as:  Oxy IR/ROXICODONE  Take 2 tablets (10 mg total) by mouth every 4 (four) hours as needed for moderate pain.     pantoprazole 40 MG tablet  Commonly known as:  PROTONIX  Take 1 tablet (40 mg total)  by mouth 2 (two) times daily.     potassium chloride SA 20 MEQ tablet  Commonly known as:  K-DUR,KLOR-CON  Take 1 tablet (20 mEq total) by mouth daily.     potassium chloride SA 20 MEQ tablet  Commonly known as:  K-DUR,KLOR-CON  Take 2 tablets (40 mEq total) by mouth daily.     prochlorperazine 10 MG tablet  Commonly known as:  COMPAZINE  Take 1 tablet (10 mg total) by mouth every 6 (six) hours as needed for nausea or vomiting.     saccharomyces boulardii 250 MG capsule  Commonly known as:  FLORASTOR  Take 1 capsule (250 mg total) by mouth 2 (two) times daily.     temazepam 15 MG capsule  Commonly known as:  RESTORIL  Take 1 capsule (15 mg total) by mouth at bedtime as needed for sleep.           Follow-up Information    Follow up with Odis Hollingshead, MD On 01/10/2015.   Specialty:  General Surgery   Why:  Our office will call with follow up in about 2 weeks.   Contact information:   [address]  [place] [address] [phone]       Follow up with [facility].   Why:  HHRN/PT/OT/AIDE/SW.   Contact information:   [address] [place] [address] [place] [address] [phone]        Time coordinating discharge: 35 minutes.  Signed:  Amber Guthridge  Pager [phone] [facility] 01/11/2015, 12:53 PM

## 2015-01-11 NOTE — Discharge Instructions (Signed)
Pulmonary Embolism A pulmonary (lung) embolism (PE) is a blood clot that has traveled to the lung and results in a blockage of blood flow in the affected lung. Most clots come from deep veins in the legs or pelvis. PE is a dangerous and potentially life-threatening condition that can be treated if identified. CAUSES Blood clots form in a vein for different reasons. Usually several things cause blood clots. They include:  The flow of blood slows down.  The inside of the vein is damaged in some way.  The person has a condition that makes the blood clot more easily. RISK FACTORS Some people are more likely than others to develop PE. Risk factors include:   Smoking.  Being overweight (obese).  Sitting or lying still for a long time. This includes long-distance travel, paralysis, or recovery from an illness or surgery. Other factors that increase risk are:   Older age, especially over 75 years of age.  Having a family history of blood clots or if you have already had a blood clot.  Having major or lengthy surgery. This is especially true for surgery on the hip, knee, or belly (abdomen). Hip surgery is particularly high risk.  Having a long, thin tube (catheter) placed inside a vein during a medical procedure.  Breaking a hip or leg.  Having cancer or cancer treatment.  Medicines containing the male hormone estrogen. This includes birth control pills and hormone replacement therapy.  Other circulation or heart problems.  Pregnancy and childbirth.  Hormone changes make the blood clot more easily during pregnancy.  The fetus puts pressure on the veins of the pelvis.  There is a risk of injury to veins during delivery or a caesarean delivery. The risk is highest just after childbirth.  PREVENTION   Exercise the legs regularly. Take a brisk 30 minute walk every day.  Maintain a weight that is appropriate for your height.  Avoid sitting or lying in bed for long periods of  time without moving your legs.  Women, particularly those over the age of 35 years, should consider the risks and benefits of taking estrogen medicines, including birth control pills.  Do not smoke, especially if you take estrogen medicines.  Long-distance travel can increase your risk. You should exercise your legs by walking or pumping the muscles every hour.  Many of the risk factors above relate to situations that exist with hospitalization, either for illness, injury, or elective surgery. Prevention may include medical and nonmedical measures.   Your health care provider will assess you for the need for venous thromboembolism prevention when you are admitted to the hospital. If you are having surgery, your surgeon will assess you the day of or day after surgery.  SYMPTOMS  The symptoms of a PE usually start suddenly and include:  Shortness of breath.  Coughing.  Coughing up blood or blood-tinged mucus.  Chest pain. Pain is often worse with deep breaths.  Rapid heartbeat. DIAGNOSIS  If a PE is suspected, your health care provider will take a medical history and perform a physical exam. Other tests that may be required include:  Blood tests, such as studies of the clotting properties of your blood.  Imaging tests, such as ultrasound, CT, MRI, and other tests to see if you have clots in your legs or lungs.  An electrocardiogram. This can look for heart strain from blood clots in the lungs. TREATMENT   The most common treatment for a PE is blood thinning (anticoagulant) medicine, which reduces   the blood's tendency to clot. Anticoagulants can stop new blood clots from forming and old clots from growing. They cannot dissolve existing clots. Your body does this by itself over time. Anticoagulants can be given by mouth, through an intravenous (IV) tube, or by injection. Your health care provider will determine the best program for you.  Less commonly, clot-dissolving medicines  (thrombolytics) are used to dissolve a PE. They carry a high risk of bleeding, so they are used mainly in severe cases.  Very rarely, a blood clot in the leg needs to be removed surgically.  If you are unable to take anticoagulants, your health care provider may arrange for you to have a filter placed in a main vein in your abdomen. This filter prevents clots from traveling to your lungs. HOME CARE INSTRUCTIONS   Take all medicines as directed by your health care provider.  Learn as much as you can about DVT.  Wear a medical alert bracelet or carry a medical alert card.  Ask your health care provider how soon you can go back to normal activities. It is important to stay active to prevent blood clots. If you are on anticoagulant medicine, avoid contact sports.  It is very important to exercise. This is especially important while traveling, sitting, or standing for long periods of time. Exercise your legs by walking or by tightening and relaxing your leg muscles regularly. Take frequent walks.  You may need to wear compression stockings. These are tight elastic stockings that apply pressure to the lower legs. This pressure can help keep the blood in the legs from clotting. Taking Warfarin Warfarin is a daily medicine that is taken by mouth. Your health care provider will advise you on the length of treatment (usually 3-6 months, sometimes lifelong). If you take warfarin:  Understand how to take warfarin and foods that can affect how warfarin works in your body.  Too much and too little warfarin are both dangerous. Too much warfarin increases the risk of bleeding. Too little warfarin continues to allow the risk for blood clots. Warfarin and Regular Blood Testing While taking warfarin, you will need to have regular blood tests to measure your blood clotting time. These blood tests usually include both the prothrombin time (PT) and international normalized ratio (INR) tests. The PT and INR  results allow your health care provider to adjust your dose of warfarin. It is very important that you have your PT and INR tested as often as directed by your health care provider.  Warfarin and Your Diet Avoid major changes in your diet, or notify your health care provider before changing your diet. Arrange a visit with a registered dietitian to answer your questions. Many foods, especially foods high in vitamin K, can interfere with warfarin and affect the PT and INR results. You should eat a consistent amount of foods high in vitamin K. Foods high in vitamin K include:   Spinach, kale, broccoli, cabbage, collard and turnip greens, Brussels sprouts, peas, cauliflower, seaweed, and parsley.  Beef and pork liver.  Green tea.  Soybean oil. Warfarin with Other Medicines Many medicines can interfere with warfarin and affect the PT and INR results. You must:  Tell your health care provider about any and all medicines, vitamins, and supplements you take, including aspirin and other over-the-counter anti-inflammatory medicines. Be especially cautious with aspirin and anti-inflammatory medicines. Ask your health care provider before taking these.  Do not take or discontinue any prescribed or over-the-counter medicine except on the advice   of your health care provider or pharmacist. Warfarin Side Effects Warfarin can have side effects, such as easy bruising and difficulty stopping bleeding. Ask your health care provider or pharmacist about other side effects of warfarin. You will need to:  Hold pressure over cuts for longer than usual.  Notify your dentist and other health care providers that you are taking warfarin before you undergo any procedures where bleeding may occur. Warfarin with Alcohol and Tobacco   Drinking alcohol frequently can increase the effect of warfarin, leading to excess bleeding. It is best to avoid alcoholic drinks or consume only very small amounts while taking warfarin.  Notify your health care provider if you change your alcohol intake.  Do not use any tobacco products including cigarettes, chewing tobacco, or electronic cigarettes. If you smoke, quit. Ask your health care provider for help with quitting smoking. Alternative Medicines to Warfarin: Factor Xa Inhibitor Medicines  These blood thinning medicines are taken by mouth, usually for several weeks or longer. It is important to take the medicine every single day, at the same time each day.  There are no regular blood tests required when using these medicines.  There are fewer food and drug interactions than with warfarin.  The side effects of this class of medicine is similar to that of warfarin, including excessive bruising or bleeding. Ask your health care provider or pharmacist about other potential side effects. SEEK MEDICAL CARE IF:   You notice a rapid heartbeat.  You feel weaker or more tired than usual.  You feel faint.  You notice increased bruising.  Your symptoms are not getting better in the time expected.  You are having side effects of medicine. SEEK IMMEDIATE MEDICAL CARE IF:   You have chest pain.  You have trouble breathing.  You have new or increased swelling or pain in one leg.  You cough up blood.  You notice blood in vomit, in a bowel movement, or in urine.  You have a fever. Symptoms of PE may represent a serious problem that is an emergency. Do not wait to see if the symptoms will go away. Get medical help right away. Call your local emergency services (911 in the [place]). Do not drive yourself to the hospital. Document Released: 11/28/2000 Document Revised: 04/17/2014 Document Reviewed: 12/12/2013 Exit[employer] Patient Information 2015 [employer]. This information is not intended to replace advice given to you by your health care provider. Make sure you discuss any questions you have with your health care provider.  

## 2015-01-24 ENCOUNTER — Inpatient Hospital Stay (HOSPITAL_COMMUNITY): Payer: Medicare Other

## 2015-01-24 ENCOUNTER — Encounter (HOSPITAL_COMMUNITY): Payer: Self-pay

## 2015-01-24 ENCOUNTER — Other Ambulatory Visit (INDEPENDENT_AMBULATORY_CARE_PROVIDER_SITE_OTHER): Payer: Self-pay | Admitting: General Surgery

## 2015-01-24 ENCOUNTER — Encounter (INDEPENDENT_AMBULATORY_CARE_PROVIDER_SITE_OTHER): Payer: Self-pay | Admitting: General Surgery

## 2015-01-24 ENCOUNTER — Inpatient Hospital Stay (HOSPITAL_COMMUNITY)
Admission: AD | Admit: 2015-01-24 | Discharge: 2015-02-01 | DRG: 640 | Disposition: A | Discharge status: Home Health | Payer: Medicare Other | Source: Ambulatory Visit | Attending: Surgery | Admitting: Surgery

## 2015-01-24 DIAGNOSIS — I2699 Other pulmonary embolism without acute cor pulmonale: Secondary | ICD-10-CM | POA: Diagnosis present

## 2015-01-24 DIAGNOSIS — Z8711 Personal history of peptic ulcer disease: Secondary | ICD-10-CM

## 2015-01-24 DIAGNOSIS — R627 Adult failure to thrive: Principal | ICD-10-CM | POA: Diagnosis present

## 2015-01-24 DIAGNOSIS — C3490 Malignant neoplasm of unspecified part of unspecified bronchus or lung: Secondary | ICD-10-CM | POA: Diagnosis present

## 2015-01-24 DIAGNOSIS — E43 Unspecified severe protein-calorie malnutrition: Secondary | ICD-10-CM | POA: Diagnosis present

## 2015-01-24 DIAGNOSIS — Z6820 Body mass index (BMI) 20.0-20.9, adult: Secondary | ICD-10-CM | POA: Diagnosis not present

## 2015-01-24 DIAGNOSIS — R112 Nausea with vomiting, unspecified: Secondary | ICD-10-CM | POA: Diagnosis present

## 2015-01-24 DIAGNOSIS — G8929 Other chronic pain: Secondary | ICD-10-CM | POA: Diagnosis present

## 2015-01-24 DIAGNOSIS — Z79899 Other long term (current) drug therapy: Secondary | ICD-10-CM

## 2015-01-24 DIAGNOSIS — J449 Chronic obstructive pulmonary disease, unspecified: Secondary | ICD-10-CM | POA: Diagnosis present

## 2015-01-24 DIAGNOSIS — E785 Hyperlipidemia, unspecified: Secondary | ICD-10-CM | POA: Diagnosis present

## 2015-01-24 DIAGNOSIS — G629 Polyneuropathy, unspecified: Secondary | ICD-10-CM | POA: Diagnosis present

## 2015-01-24 DIAGNOSIS — E119 Type 2 diabetes mellitus without complications: Secondary | ICD-10-CM | POA: Diagnosis present

## 2015-01-24 LAB — COMPREHENSIVE METABOLIC PANEL
ALT: 37 U/L (ref 0–53)
AST: 25 U/L (ref 0–37)
Albumin: 3.8 g/dL (ref 3.5–5.2)
Alkaline Phosphatase: 142 U/L — ABNORMAL HIGH (ref 39–117)
Anion gap: 9 (ref 5–15)
BILIRUBIN TOTAL: 0.6 mg/dL (ref 0.3–1.2)
BUN: 24 mg/dL — ABNORMAL HIGH (ref 6–23)
CHLORIDE: 99 mmol/L (ref 96–112)
CO2: 28 mmol/L (ref 19–32)
CREATININE: 0.77 mg/dL (ref 0.50–1.35)
Calcium: 9.5 mg/dL (ref 8.4–10.5)
GFR calc Af Amer: >90 mL/min (ref >90)
GFR calc non Af Amer: >90 mL/min (ref >90)
Glucose, Bld: 164 mg/dL — ABNORMAL HIGH (ref 70–99)
POTASSIUM: 4 mmol/L (ref 3.5–5.1)
SODIUM: 136 mmol/L (ref 135–145)
Total Protein: 7.7 g/dL (ref 6.0–8.3)

## 2015-01-24 LAB — CBC
HEMATOCRIT: 36.9 % — AB (ref 39.0–52.0)
Hemoglobin: 11.5 g/dL — ABNORMAL LOW (ref 13.0–17.0)
MCH: 27.5 pg (ref 26.0–34.0)
MCHC: 31.2 g/dL (ref 30.0–36.0)
MCV: 88.3 fL (ref 78.0–100.0)
PLATELETS: 288 10*3/uL (ref 150–400)
RBC: 4.18 MIL/uL — ABNORMAL LOW (ref 4.22–5.81)
RDW: 16.9 % — ABNORMAL HIGH (ref 11.5–15.5)
WBC: 15 10*3/uL — ABNORMAL HIGH (ref 4.0–10.5)

## 2015-01-24 LAB — GLUCOSE, CAPILLARY
Glucose-Capillary: 138 mg/dL — ABNORMAL HIGH (ref 70–99)
Glucose-Capillary: 166 mg/dL — ABNORMAL HIGH (ref 70–99)
Glucose-Capillary: 122 mg/dL — ABNORMAL HIGH (ref 70–99)

## 2015-01-24 MED ORDER — INSULIN ASPART 100 UNIT/ML ~~LOC~~ SOLN
0.0000 [IU] | SUBCUTANEOUS | Status: DC
  Administered 2015-01-24 (×2): 1 [IU] via SUBCUTANEOUS
  Administered 2015-01-24 – 2015-01-25 (×2): 2 [IU] via SUBCUTANEOUS
  Administered 2015-01-25: 1 [IU] via SUBCUTANEOUS
  Administered 2015-01-25 (×3): 2 [IU] via SUBCUTANEOUS
  Administered 2015-01-25: 1 [IU] via SUBCUTANEOUS
  Administered 2015-01-26 (×3): 2 [IU] via SUBCUTANEOUS
  Administered 2015-01-27: 1 [IU] via SUBCUTANEOUS
  Administered 2015-01-27: 2 [IU] via SUBCUTANEOUS
  Administered 2015-01-27: 1 [IU] via SUBCUTANEOUS
  Administered 2015-01-27: 2 [IU] via SUBCUTANEOUS
  Administered 2015-01-27: 1 [IU] via SUBCUTANEOUS
  Administered 2015-01-27: 2 [IU] via SUBCUTANEOUS
  Administered 2015-01-28: 3 [IU] via SUBCUTANEOUS
  Administered 2015-01-28: 1 [IU] via SUBCUTANEOUS
  Administered 2015-01-28 – 2015-01-29 (×5): 2 [IU] via SUBCUTANEOUS
  Administered 2015-01-29: 3 [IU] via SUBCUTANEOUS
  Administered 2015-01-29: 1 [IU] via SUBCUTANEOUS
  Administered 2015-01-29: 2 [IU] via SUBCUTANEOUS
  Administered 2015-01-30: 3 [IU] via SUBCUTANEOUS
  Administered 2015-01-30: 1 [IU] via SUBCUTANEOUS
  Administered 2015-01-30 (×2): 3 [IU] via SUBCUTANEOUS
  Administered 2015-01-31: 2 [IU] via SUBCUTANEOUS
  Administered 2015-01-31: 1 [IU] via SUBCUTANEOUS
  Administered 2015-01-31: 3 [IU] via SUBCUTANEOUS
  Administered 2015-01-31: 2 [IU] via SUBCUTANEOUS
  Administered 2015-01-31 – 2015-02-01 (×2): 1 [IU] via SUBCUTANEOUS
  Administered 2015-02-01 (×2): 3 [IU] via SUBCUTANEOUS

## 2015-01-24 MED ORDER — JEVITY 1.2 CAL PO LIQD: 1000.0000 mL | ORAL | Status: DC

## 2015-01-24 MED ORDER — ONDANSETRON HCL 4 MG/2ML IJ SOLN
4.0000 mg | INTRAMUSCULAR | Status: DC | PRN
  Administered 2015-01-24 – 2015-01-30 (×3): 4 mg via INTRAVENOUS
  Filled 2015-01-24 (×3): qty 2

## 2015-01-24 MED ORDER — DEXTROSE-NACL 5-0.9 % IV SOLN
INTRAVENOUS | Status: DC
  Administered 2015-01-24: 125 mL/h via INTRAVENOUS
  Administered 2015-01-24: 12:00:00 via INTRAVENOUS
  Administered 2015-01-25: 1000 mL via INTRAVENOUS
  Administered 2015-01-25: 125 mL via INTRAVENOUS

## 2015-01-24 MED ORDER — ENOXAPARIN SODIUM 100 MG/ML ~~LOC~~ SOLN
100.0000 mg | SUBCUTANEOUS | Status: DC
  Administered 2015-01-24 – 2015-01-31 (×8): 100 mg via SUBCUTANEOUS
  Filled 2015-01-24 (×9): qty 1

## 2015-01-24 MED ORDER — PRO-STAT SUGAR FREE PO LIQD
60.0000 mL | Freq: Two times a day (BID) | ORAL | Status: DC
  Administered 2015-01-26 – 2015-01-29 (×5): 60 mL via ORAL
  Filled 2015-01-24 (×11): qty 60

## 2015-01-24 MED ORDER — MORPHINE SULFATE 2 MG/ML IJ SOLN
2.0000 mg | INTRAMUSCULAR | Status: DC | PRN
  Administered 2015-01-24 – 2015-01-25 (×4): 2 mg via INTRAVENOUS
  Administered 2015-01-25: 4 mg via INTRAVENOUS
  Administered 2015-01-25 (×3): 2 mg via INTRAVENOUS
  Administered 2015-01-26 – 2015-01-27 (×7): 4 mg via INTRAVENOUS
  Administered 2015-01-27: 2 mg via INTRAVENOUS
  Administered 2015-01-27: 4 mg via INTRAVENOUS
  Administered 2015-01-27: 2 mg via INTRAVENOUS
  Administered 2015-01-27 – 2015-01-29 (×9): 4 mg via INTRAVENOUS
  Filled 2015-01-24 (×2): qty 2
  Filled 2015-01-24: qty 1
  Filled 2015-01-24 (×3): qty 2
  Filled 2015-01-24 (×3): qty 1
  Filled 2015-01-24 (×2): qty 2
  Filled 2015-01-24 (×2): qty 1
  Filled 2015-01-24 (×2): qty 2
  Filled 2015-01-24: qty 1
  Filled 2015-01-24 (×3): qty 2
  Filled 2015-01-24: qty 1
  Filled 2015-01-24 (×2): qty 2
  Filled 2015-01-24 (×2): qty 1
  Filled 2015-01-24: qty 2
  Filled 2015-01-24: qty 1
  Filled 2015-01-24 (×2): qty 2

## 2015-01-24 MED ORDER — PANTOPRAZOLE SODIUM 40 MG IV SOLR
40.0000 mg | Freq: Two times a day (BID) | INTRAVENOUS | Status: DC
  Administered 2015-01-24 – 2015-01-29 (×11): 40 mg via INTRAVENOUS
  Filled 2015-01-24 (×12): qty 40

## 2015-01-24 MED ORDER — OSMOLITE 1.5 CAL PO LIQD
1000.0000 mL | ORAL | Status: DC
Start: 2015-01-24 — End: 2015-02-01
  Administered 2015-01-24 – 2015-01-31 (×10): 1000 mL
  Filled 2015-01-24 (×15): qty 1000

## 2015-01-24 NOTE — Progress Notes (Signed)
INITIAL NUTRITION ASSESSMENT  DOCUMENTATION CODES Per approved criteria  -Severe malnutrition in the context of chronic illness  Pt meets criteria for severe malnutrition in the context of chronic illness as evidenced by >5% weight loss in 1 month, and severe body fat and body mass depletion.   INTERVENTION: Initiate Osmolite 1.5 @ 20 ml and increase 10 ml every 4 hours to goal rate 75 ml/hr over 12 hours (8pm-8am)  Initiate Prostat 60 ml BID   Provides 1750 kcal (100% of estimated energy requirements), 116 grams of protein, and 685.8 ml of H20  Recommend Resource Breeze TID upon diet advancement   NUTRITION DIAGNOSIS: Inadequate oral intake related to inability to eat; N/V as evidenced by NPO status.   Goal: Pt to meet >/= 90% of estimated energy needs  Monitor:  TF tolerance, labs, weight, I/O's,   Reason for Assessment: Consult for enteral/tube feeding initiation and management   69 y.o. male  Admitting Dx: <principal problem not specified>  ASSESSMENT: 69 y/o male with PMH significant for DM, HLD, mixed type COPD; duodenal bleeding ulcer (post surgery), protein calorie malnutrition (on enteral feeding) and lung cancer (treatment ended). Presented with weight loss, and persistent nausea and vomiting.   Labs and medications reviewed- elevated glucose and BUN.  Pt reported significant weight loss, and decreased appetite. He stated having N/V for past 3 days pta; unable to keep any food or liquid down. Pt reported experiencing taste changes, and food feeling as though it was getting bigger in his mouth. No foods taste good to him; however jello tasted ok.    Pt stated he has been tolerating tube feeding well. Reported no diarrhea or abdominal pain. He does not have any knowledge related to his TF formula, or his nocturnal feedings. Will follow-up with wife.                   Current diet NPO.  Nutrition Focused Physical Exam:  Subcutaneous Fat:  Orbital Region: severe  depletion Upper Arm Region: severe dpeletion Thoracic and Lumbar Region: severe depletion  Muscle:  Temple Region: severe depletion Clavicle Bone Region: severe depletion Clavicle and Acromion Bone Region: severe depletion Scapular Bone Region: severe depletion Dorsal Hand: severe depletion Patellar Region: severe depletion Anterior Thigh Region: severe depletion Posterior Calf Region: severe depletion  Edema: not present  Height: Ht Readings from Last 1 Encounters:  01/24/15 5\' 8"  (1.727 m)    Weight: Wt Readings from Last 1 Encounters:  01/11/15 138 lb 14.2 oz (63 kg)    Ideal Body Weight: 154 lbs (70kg)  % Ideal Body Weight: 90%  Wt Readings from Last 10 Encounters:  01/11/15 138 lb 14.2 oz (63 kg)  12/07/14 157 lb 3 oz (71.3 kg)  11/21/14 154 lb 1.6 oz (69.899 kg)  11/08/14 155 lb 11.2 oz (70.625 kg)  10/22/14 156 lb 8 oz (70.988 kg)  10/11/14 170 lb 14.4 oz (77.52 kg)  09/25/14 162 lb 6.4 oz (73.664 kg)  09/19/14 165 lb (74.844 kg)  09/06/14 165 lb 3.2 oz (74.934 kg)  08/17/14 165 lb 4.8 oz (74.98 kg)    Usual Body Weight: unknown  % Usual Body Weight: -  BMI:  21.28 kg/(m^2)  Estimated Nutritional Needs: Kcal: 3419-3790 Protein: 95-115 grams Fluid: >/= 1.7L daily  Skin: intact  Diet Order: Diet NPO time specified Except for: Ice Chips  EDUCATION NEEDS: -No education needs identified at this time   Intake/Output Summary (Last 24 hours) at 01/24/15 1540 Last data filed at  01/24/15 1400  Gross per 24 hour  Intake 239.58 ml  Output      0 ml  Net 239.58 ml    Last BM: PTA  Labs:   Recent Labs Lab 01/24/15 1228  NA 136  K 4.0  CL 99  CO2 28  BUN 24*  CREATININE 0.77  CALCIUM 9.5  GLUCOSE 164*    CBG (last 3)   Recent Labs  01/24/15 1155  GLUCAP 138*    Scheduled Meds: . enoxaparin (LOVENOX) injection  100 mg Subcutaneous Q24H  . insulin aspart  0-9 Units Subcutaneous 6 times per day  . pantoprazole (PROTONIX) IV  40  mg Intravenous Q12H    Continuous Infusions: . dextrose 5 % and 0.9% NaCl 125 mL/hr at 01/24/15 1205    Past Medical History  Diagnosis Date  . DM (diabetes mellitus)   . Pneumonia   . Hypercholesteremia   . Melanoma   . Radiation 08/03/14-08/23/14    35 gray to right chest  . Lung cancer   . FH: chemotherapy     Past Surgical History  Procedure Laterality Date  . Video bronchoscopy Bilateral 07/20/2014    Procedure: VIDEO BRONCHOSCOPY WITHOUT FLUORO;  Surgeon: Tanda Rockers, MD;  Location: [facility];  Service: Cardiopulmonary;  Laterality: Bilateral;  . Esophagogastroduodenoscopy N/A 11/30/2014    Procedure: ESOPHAGOGASTRODUODENOSCOPY (EGD);  Surgeon: [name], MD;  Location: [facility];  Service: Endoscopy;  Laterality: N/A;  . Laparotomy N/A 11/30/2014    Procedure: EXPLORATORY LAPAROTOMY, PYLOROPLASTY, OVERSEWING OF POSTERIOR DUODENUM, DUODENOSTOMY, CHOLECYSTECTOMY, JEJEUNOSTOMY;  Surgeon: Jackolyn Confer, MD;  Location: [facility];  Service: General;  Laterality: N/A;    Wynona Dove, MS Dietetic Intern Pager: [phone]

## 2015-01-24 NOTE — Progress Notes (Signed)
Patient ID: Daryl Vega, male   DOB: 08/12/1946, 69 y.o.   MRN: [id number] History and Physical  Daryl Vega. Guido 01/24/2015 10:00 AM Location: [facility] Patient #: [id number] DOB: [birth year] Married / Language: English / Race: White Male History of Present Illness Daryl Vega; 01/24/2015 10:18 AM) Patient words: recheck gastric ulcers.  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 He also had cholecystectomy. This was 11/30/14. He had a very stormy postoperative course but was able to be discharged to a skilled facility. He was readmitted at the end of January because of a pulmonary embolism. He is currently taking Lovenox. He is receiving jejunostomy tube feedings at night. For the past 3 days he's had persistent nausea and vomiting and can't keep anything down. He is weak. His bowels are still moving. He denies fever or chills.  Other Problems Daryl Buffy, Daryl Vega; 01/24/2015 10:00 AM) Anxiety Disorder Diabetes Mellitus Gastroesophageal Reflux Disease Lung Cancer Pulmonary Embolism / Blood Clot in Legs  Past Surgical History Daryl Buffy, Daryl Vega; 01/24/2015 10:00 AM) Foot Surgery Left. Resection of Small Bowel  Diagnostic Studies History Daryl Buffy, Daryl Vega; 01/24/2015 10:00 AM) Colonoscopy never  Allergies Daryl Buffy, Daryl Vega; 01/24/2015 10:01 AM) No Known Drug Allergies 01/24/2015  Medication History Daryl Buffy, Daryl Vega; 01/24/2015 10:03 AM) FentaNYL (12MCG/HR Patch 72HR, Transdermal as needed) Active. Hydrocodone-Acetaminophen (5-325MG  Tablet, Oral as needed) Active. OxyCODONE HCl (5MG  Tablet, Oral as needed) Active. LORazepam (0.5MG  Tablet, Oral as needed) Active. Temazepam (15MG  Capsule, Oral daily) Active. Atorvastatin  Calcium (80MG  Tablet, Oral daily) Active. DULoxetine HCl (20MG  Capsule DR Part, Oral daily) Active. Enoxaparin Sodium (100MG /ML Solution, Subcutaneous as directed) Active. Glimepiride (4MG  Tablet, Oral daily) Active. Klor-Con M20 [facility] Tablet ER, Oral daily) Active. MetFORMIN HCl ER (500MG  Tablet ER 24HR, Oral daily) Active. Prochlorperazine Maleate (10MG  Tablet, Oral as needed) Active. Symbicort (160-4.5MCG/ACT Aerosol, Inhalation as needed) Active. Multivitamins (Oral daily) Active. Pepto-Bismol (524MG /30ML Suspension, Oral as needed) Active. Florastor (250MG  Capsule, Oral daily) Active. Farxiga (5MG  Tablet, Oral daily) Active. Calcium-Vitamin D (250-125MG -UNIT Tablet, Oral daily) Active.  Social History Daryl Buffy, Daryl Vega; 01/24/2015 10:00 AM) Alcohol use Remotely quit alcohol use. Caffeine use Carbonated beverages. No drug use Tobacco use Former smoker.  Family History Daryl Buffy, Daryl Vega; 01/24/2015 10:00 AM) Diabetes Mellitus Sister. Heart Disease Father, Mother. Heart disease in male family member before age 78     Review of Systems Daryl Buffy Daryl Vega; 01/24/2015 10:00 AM) General Present- Weight Loss. Not Present- Appetite Loss, Chills, Fatigue, Fever, Night Sweats and Weight Gain. Skin Not Present- Change in Wart/Mole, Dryness, Hives, Jaundice, New Lesions, Non-Healing Wounds, Rash and Ulcer. HEENT Present- Hearing Loss. Not Present- Earache, Hoarseness, Nose Bleed, Oral Ulcers, Ringing in the Ears, Seasonal Allergies, Sinus Pain, Sore Throat, Visual Disturbances, Wears glasses/contact lenses and Yellow Eyes. Respiratory Not Present- Bloody sputum, Chronic Cough, Difficulty Breathing, Snoring and Wheezing. Breast Not Present- Breast Mass, Breast Pain, Nipple Discharge and Skin Changes. Cardiovascular Not Present- Chest Pain, Difficulty Breathing Lying Down, Leg Cramps, Palpitations, Rapid Heart Rate, Shortness of Breath and Swelling of  Extremities. Gastrointestinal Present- Abdominal Pain, Nausea and Vomiting. Not Present- Bloating, Bloody Stool, Change in Bowel Habits, Chronic diarrhea, Constipation, Difficulty Swallowing, Excessive gas, Gets full quickly at meals, Hemorrhoids, Indigestion and Rectal Pain. Male Genitourinary Not Present- Blood in Urine,  Change in Urinary Stream, Frequency, Impotence, Nocturia, Painful Urination, Urgency and Urine Leakage. Musculoskeletal Present- Back Pain and Joint Pain. Not Present- Joint Stiffness, Muscle Pain, Muscle Weakness and Swelling of Extremities. Neurological Not Present- Decreased Memory, Fainting, Headaches, Numbness, Seizures, Tingling, Tremor, Trouble walking and Weakness. Psychiatric Present- Anxiety. Not Present- Bipolar, Change in Sleep Pattern, Depression, Fearful and Frequent crying. Endocrine Not Present- Cold Intolerance, Excessive Hunger, Hair Changes, Heat Intolerance, Hot flashes and New Diabetes. Hematology Not Present- Easy Bruising, Excessive bleeding, Gland problems, HIV and Persistent Infections.  Vitals Daryl Shan Moffitt Daryl Vega; 01/24/2015 10:01 AM) 01/24/2015 10:01 AM Weight: 141.4 lb Height: 68in Body Surface Area: 1.75 m Body Mass Index: 21.5 kg/m Temp.: 98.35F(Oral)  Pulse: 84 (Regular)  Resp.: 24 (Unlabored)  BP: 106/72 (Sitting, Left Arm, Standard)     Physical Exam Daryl Vega; 01/24/2015 10:20 AM)  The physical exam findings are as follows: Note:General: Thin, weak appearing male. His wife is with him  HEENT: [place]/AT, no facial masses  EYES: EOMI, no icterus  CV: RRR, no murmur, no JVD.  CHEST: Breath sounds equal and clear. Respirations nonlabored.  BREASTS: Symmetrical in size. No dominant masses, nipple discharge or suspicious skin lesions.  ABDOMEN: Soft, nontender, nondistended, midline wound is clean and intact, duodenostomy tube in RUQ that is clamped, jejunostomy tube in left abdomen that is clamped  NEUROLOGIC:  Alert and oriented, answers questions appropriately.  PSYCHIATRIC: Appears depressed.    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 Has had persistent nausea and vomiting over the past 3 days such that he is not able to keep anything down. He feels weak. This could be from partial duodenal obstruction from scarring versus small bowel obstruction although he is having bowel movements.  Plan: Admit to hospital. Obtain laboratory studies as well as abdominal x-rays. May need further studies including upper GI and/or CT scan.  Daryl Confer, Vega

## 2015-01-24 NOTE — Progress Notes (Signed)
ANTICOAGULATION CONSULT NOTE - Initial Consult  Pharmacy Consult for Lovenox Indication: pulmonary embolus  No Known Allergies  Patient Measurements:   Heparin Dosing Weight:   Vital Signs: BP: 108/83 mmHg (02/10 1130) Pulse Rate: 110 (02/10 1130)  Labs: No results for input(s): HGB, HCT, PLT, APTT, LABPROT, INR, HEPARINUNFRC, CREATININE, CKTOTAL, CKMB, TROPONINI in the last 72 hours.  Estimated Creatinine Clearance: 79.8 mL/min (by C-G formula based on Cr of 0.58).   Medical History: Past Medical History  Diagnosis Date  . DM (diabetes mellitus)   . Pneumonia   . Hypercholesteremia   . Melanoma   . Radiation 08/03/14-08/23/14    35 gray to right chest  . Lung cancer   . FH: chemotherapy    Assessment: 49 yoM s/p multiple surgeries in December 2015 for bleeding duodenal ulcer, repair of chronic cholecystoduodenal fistula, and cholecystecomy.  Pt recently admitted for PE and discharged on lovenox.  Direct admit from surgery office visit for persistent N/V possibly from partial duodenal obstruction vs SBO.  Pharmacy consulted to resume lovenox dosing inpatient.    PTA lovenox 100mg  daily, LD -2/9 @ 5 or 6 PM per wife. No other antiplatelet/anticoagulants noted.  No issues noted with renal fxn.  Hgb low 10.1, may be at baseline (hx anemia), plts WNL.  Per wife, weight decreasing, although per [facility] records, appears stable.  Wt this AM at MD office = 141.9lbs.     Goal of Therapy:  Anti-Xa level 0.6-1 unit/ml 4 hours after last dose of LMWH Monitor platelets by anticoagulation protocol: Yes   Plan:  Continue lovenox 100mg  q24h - first dose tonight at 6PM Follow-up CBC, clinical course  Ralene Bathe, PharmD, BCPS 01/24/2015, 11:40 AM  Pager: [phone]

## 2015-01-24 NOTE — Progress Notes (Signed)
[facility]  Patient Status: Active (receiving services up to time of hospitalization)  [facility] is providing the following services: RN, PT, OT, MSW and HHA  If patient discharges after hours, please call [phone].   Daryl Vega 01/24/2015, 12:47 PM

## 2015-01-24 NOTE — Progress Notes (Signed)
Subjective: He looks fine, says he hasn't been eating much.  No real complaints.  The abdominal wound has healed and looks fine.    Objective: Vital signs in last 24 hours: Temp:  [97.5 F (36.4 C)-99 F (37.2 C)] 99 F (37.2 C) (02/10 1544) Pulse Rate:  [100-110] 100 (02/10 1544) BP: (105-108)/(71-83) 105/71 mmHg (02/10 1544) SpO2:  [97 %-98 %] 98 % (02/10 1544) Last BM Date: 01/23/15 Afebrile, VSS, he is tachycardic Glucose is up and wBC is up to 15K Film shows RUL consistent with radiation scarring.  Improve aeration, nonobstructive bowel gas pattern. Intake/Output from previous day:   Intake/Output this shift: Total I/O In: 239.6 [I.V.:239.6] Out: -   General appearance: alert, cooperative and no distress Resp: clear to auscultation bilaterally GI: soft, non-tender; bowel sounds normal; no masses,  no organomegaly and tubes in place and sites look fine.  Lab Results:   Recent Labs  01/24/15 1228  WBC 15.0*  HGB 11.5*  HCT 36.9*  PLT 288    BMET  Recent Labs  01/24/15 1228  NA 136  K 4.0  CL 99  CO2 28  GLUCOSE 164*  BUN 24*  CREATININE 0.77  CALCIUM 9.5   PT/INR No results for input(s): LABPROT, INR in the last 72 hours.   Recent Labs Lab 01/24/15 1228  AST 25  ALT 37  ALKPHOS 142*  BILITOT 0.6  PROT 7.7  ALBUMIN 3.8     Lipase     Component Value Date/Time   LIPASE 41 11/29/2014 0644     Studies/Results: Dg Abd Acute W/chest  01/24/2015   CLINICAL DATA:  History lung cancer.  Nausea and vomiting.  EXAM: ACUTE ABDOMEN SERIES (ABDOMEN 2 VIEW & CHEST 1 VIEW)  COMPARISON:  01/06/2015 chest x-ray.  CT abdomen 12/11/2014  FINDINGS: Right apical streaky markings are unchanged and may be due to scarring from radiation therapy. There is volume loss the right upper lobe. Overall the lung volume is improved and there is improvement in bibasilar atelectasis since prior study. Negative for heart failure. Small right effusion.  Gastrostomy tube  with inflated balloon overlying the gastric antrum. There is a left-sided abdominal catheter which was not present on the priors CT. Tip overlies the region of the hepatic flexure.  Normal bowel gas pattern. No bowel obstruction or free air. Surgical clips in the gallbladder fossa.  IMPRESSION: Right upper lobe streaky markings most consistent with radiation scarring.  Improved aeration since the prior study with improvement in bibasilar atelectasis  Nonobstructive bowel gas pattern.   Electronically Signed   By: Franchot Gallo M.D.   On: 01/24/2015 14:35    Medications: . enoxaparin (LOVENOX) injection  100 mg Subcutaneous Q24H  . insulin aspart  0-9 Units Subcutaneous 6 times per day  . pantoprazole (PROTONIX) IV  40 mg Intravenous Q12H    Assessment/Plan 1.  Recurrent nausea and vomiting 2. Bleeding duodenal ulcer with cholecystoduodenal fistula --EGD for control of bleeding - 11/30/16 - [name] --Ex Lap, oversewing of posterior bleeding DU, Heineke Mikulicz pyloroplasty, cholecystectomy, repair of right hepatic artery, repair of cholecystoduodenal fistula and placement of duodenostomy tube, feeding jejunostomy - 11/30/14 - Dr. Jackolyn Confer 3. Left pulmonary embolism 4. 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 (07/2014) 5. 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  Restart OT and PT tomorrow and ask  dietician to see and help with TF if that is necessary.      LOS: 0 days    Ann Bohne 01/24/2015

## 2015-01-25 ENCOUNTER — Inpatient Hospital Stay (HOSPITAL_COMMUNITY): Payer: Medicare Other

## 2015-01-25 LAB — BASIC METABOLIC PANEL
ANION GAP: 8 (ref 5–15)
BUN: 15 mg/dL (ref 6–23)
CHLORIDE: 101 mmol/L (ref 96–112)
CO2: 28 mmol/L (ref 19–32)
CREATININE: 0.69 mg/dL (ref 0.50–1.35)
Calcium: 8.7 mg/dL (ref 8.4–10.5)
GFR calc Af Amer: >90 mL/min (ref >90)
GFR calc non Af Amer: >90 mL/min (ref >90)
GLUCOSE: 184 mg/dL — AB (ref 70–99)
Potassium: 3.6 mmol/L (ref 3.5–5.1)
Sodium: 137 mmol/L (ref 135–145)

## 2015-01-25 LAB — URINALYSIS, ROUTINE W REFLEX MICROSCOPIC
Bilirubin Urine: NEGATIVE
Glucose, UA: NEGATIVE mg/dL
Hgb urine dipstick: NEGATIVE
Ketones, ur: NEGATIVE mg/dL
Leukocytes, UA: NEGATIVE
Nitrite: NEGATIVE
Protein, ur: NEGATIVE mg/dL
SPECIFIC GRAVITY, URINE: 1.017 (ref 1.005–1.030)
Urobilinogen, UA: 1 mg/dL (ref 0.0–1.0)
pH: 6.5 (ref 5.0–8.0)

## 2015-01-25 LAB — GLUCOSE, CAPILLARY
GLUCOSE-CAPILLARY: 109 mg/dL — AB (ref 70–99)
GLUCOSE-CAPILLARY: 133 mg/dL — AB (ref 70–99)
GLUCOSE-CAPILLARY: 171 mg/dL — AB (ref 70–99)
GLUCOSE-CAPILLARY: 186 mg/dL — AB (ref 70–99)
Glucose-Capillary: 124 mg/dL — ABNORMAL HIGH (ref 70–99)
Glucose-Capillary: 151 mg/dL — ABNORMAL HIGH (ref 70–99)
Glucose-Capillary: 152 mg/dL — ABNORMAL HIGH (ref 70–99)

## 2015-01-25 LAB — CBC
HCT: 30.9 % — ABNORMAL LOW (ref 39.0–52.0)
HEMOGLOBIN: 9.7 g/dL — AB (ref 13.0–17.0)
MCH: 27.9 pg (ref 26.0–34.0)
MCHC: 31.4 g/dL (ref 30.0–36.0)
MCV: 88.8 fL (ref 78.0–100.0)
PLATELETS: 218 10*3/uL (ref 150–400)
RBC: 3.48 MIL/uL — ABNORMAL LOW (ref 4.22–5.81)
RDW: 17.1 % — ABNORMAL HIGH (ref 11.5–15.5)
WBC: 9.4 10*3/uL (ref 4.0–10.5)

## 2015-01-25 LAB — PREALBUMIN: Prealbumin: 27.7 mg/dL (ref 17.0–34.0)

## 2015-01-25 MED ORDER — IOHEXOL 300 MG/ML  SOLN
150.0000 mL | Freq: Once | INTRAMUSCULAR | Status: AC | PRN
  Administered 2015-01-25: 60 mL via ORAL

## 2015-01-25 MED ORDER — KCL IN DEXTROSE-NACL 20-5-0.9 MEQ/L-%-% IV SOLN
INTRAVENOUS | Status: DC
Start: 2015-01-25 — End: 2015-01-31
  Administered 2015-01-25: 1000 mL via INTRAVENOUS
  Administered 2015-01-26 – 2015-01-30 (×8): via INTRAVENOUS
  Administered 2015-01-31: 50 mL/h via INTRAVENOUS
  Filled 2015-01-25 (×16): qty 1000

## 2015-01-25 NOTE — Progress Notes (Signed)
Re: Tube Feedings.  Received afternoon orders modifying tube feeds.  Noted orders read "continuous 75 ml/hr" but MAR still showing TF for 8 pm to 8 am.  Unable to reach dietician for clarification.  Spoke with Pharmacist who said said intent was unclear (whether TF to now be 24 hours and 12 hour administration instructions not changed in error or intentionally left for 12 hour administration).  Paged on-call [facility] MD, Dr. Johney Maine, who instructed nurse to restart TF this afternoon and seek clarification from pt's primary MD in morning regarding 12 vs 24 hour administration.  Discussed ramping up of TF, as previously performed by night nurse (increasing by 10 ml/hr, q4h, if tolerated) and how TF had been restarted by me at previously tolerated rate of 40 ml/hr, increased to 50 ml/hr, pending clarification of orders.  Dr. Johney Maine instructed RN to ramp all the way to 75 ml/hr, but to be decreased as per previous  orders, if pt not tolerating as indicated by gastric residual and other factors.  TF adjusted to 75 ml/hr.  Will advise night nurse of this plan.

## 2015-01-25 NOTE — Progress Notes (Addendum)
INTERVENTION: Continue Osmolite 1.5 @ 75 ml/hr over 12 hours (8pm-8am)  Continue Prostat 60 ml BID   Provides 1750 kcal (100% of estimated energy requirements), 116 grams of protein, and 685.8 ml of H20  Recommend Resource Breeze TID upon diet advancement  NUTRITION DIAGNOSIS: Inadequate oral intake related to inability to eat; N/V as evidenced by NPO status; ongoing   Goal: Pt to meet >/= 90% of estimated energy needs; ongoing  Monitor:  TF tolerance, labs, weight, I/O's,   Admitting Dx: nausea, vomiting   ASSESSMENT: 69 y/o male with PMH significant for DM, HLD, mixed type COPD; duodenal bleeding ulcer (post surgery), protein calorie malnutrition (on enteral feeding) and lung cancer (treatment ended). Presented with weight loss, and persistent nausea and vomiting.   Labs and medications reviewed- elevated glucose and BUN.  Pt tolerated Osmolite 1.5 well overnight. Wife at bedside; talked about advancing diet to clear liquids. Pt felt better this morning. No N/V at this time. Energy needs remain same. Continue nocturnal TFs with Prostat.   Height: Ht Readings from Last 1 Encounters:  01/24/15 5\' 8"  (1.727 m)    Weight: Wt Readings from Last 1 Encounters:  01/11/15 138 lb 14.2 oz (63 kg)    BMI: 21.28 kg/(m^2)  Estimated Nutritional Needs: Kcal: 1700-2080 Protein: 95-115 grams Fluid: >/= 1.7L daily  Skin: intact  Diet Order: Diet NPO time specified Except for: Ice Chips  EDUCATION NEEDS: -No education needs identified at this time   Intake/Output Summary (Last 24 hours) at 01/24/15 1540 Last data filed at 01/24/15 1400  Gross per 24 hour  Intake 239.58 ml  Output  0 ml  Net 239.58 ml    Last BM: PTA  Labs:   Last Labs      Recent Labs Lab 01/24/15 1228  NA 136  K 4.0  CL 99  CO2 28  BUN 24*  CREATININE 0.77  CALCIUM 9.5  GLUCOSE 164*      CBG (last 3)   Recent Labs (last 2 labs)      Recent  Labs  01/24/15 1155  GLUCAP 138*      Scheduled Meds: . enoxaparin (LOVENOX) injection 100 mg Subcutaneous Q24H  . insulin aspart 0-9 Units Subcutaneous 6 times per day  . pantoprazole (PROTONIX) IV 40 mg Intravenous Q12H    Continuous Infusions: . dextrose 5 % and 0.9% NaCl 125 mL/hr at 01/24/15 1205      Wynona Dove, MS Dietetic Intern Pager: [phone]

## 2015-01-25 NOTE — Evaluation (Signed)
Physical Therapy Evaluation Patient Details Name: Daryl Vega MRN: [id number] DOB: Aug 18, 1946 Today's Date: 01/25/2015   History of Present Illness  69 yo male with history of  lung cancer, admitted 01/06/15 with fall, N/V, on tube feedings, positive for PE last admit.   Clinical Impression  Patient tolerated ambulation  X 250'. Patient will benefit from PT to address problems listed in note below.    Follow Up Recommendations Home health PT;Supervision/Assistance - 24 hour    Equipment Recommendations  None recommended by PT    Recommendations for Other Services       Precautions / Restrictions Precautions Precautions: Fall Precaution Comments: PEG, Monitor sats      Mobility  Bed Mobility Overal bed mobility: Modified Independent                Transfers Overall transfer level: Needs assistance Equipment used: Rolling walker (2 wheeled) Transfers: Sit to/from Stand Sit to Stand: Min guard         General transfer comment: cues for safety  Ambulation/Gait Ambulation/Gait assistance: Min guard Ambulation Distance (Feet): 250 Feet Assistive device: Rolling walker (2 wheeled) Gait Pattern/deviations: Step-through pattern     General Gait Details: steady with RW, cues for safety  Stairs            Wheelchair Mobility    Modified Rankin (Stroke Patients Only)       Balance                                             Pertinent Vitals/Pain Pain Assessment: 0-10 Pain Score: 7  Pain Location: hands and feet Pain Descriptors / Indicators: Aching;Discomfort Pain Intervention(s): Monitored during session;Patient requesting pain meds-RN notified    Home Living Family/patient expects to be discharged to:: Private residence Living Arrangements: Spouse/significant other Available Help at Discharge: Family Type of Home: Mobile home Home Access: Stairs to enter Entrance Stairs-Rails: Right Entrance Stairs-Number of Steps:  4 Home Layout: One level Home Equipment: [place] - single point;Wheelchair - [employer] - 2 wheels      Prior Function Level of Independence: Needs assistance   Gait / Transfers Assistance Needed: walks w/ RW     Comments: Pt reports hx of falls at home     Hand Dominance        Extremity/Trunk Assessment   Upper Extremity Assessment: Generalized weakness           Lower Extremity Assessment: Generalized weakness         Communication   Communication: HOH  Cognition Arousal/Alertness: Awake/alert Behavior During Therapy: Flat affect Overall Cognitive Status: Within Functional Limits for tasks assessed                      General Comments      Exercises        Assessment/Plan    PT Assessment Patient needs continued PT services  PT Diagnosis Generalized weakness;Difficulty walking   PT Problem List Decreased strength;Decreased activity tolerance;Decreased mobility  PT Treatment Interventions DME instruction;Gait training;Functional mobility training;Therapeutic activities;Therapeutic exercise;Patient/family education   PT Goals (Current goals can be found in the Care Plan section) Acute Rehab PT Goals Patient Stated Goal: to go home, find out why I am not able to keep anything down PT Goal Formulation: With patient/family Time For Goal Achievement: 02/08/15 Potential to Achieve Goals: Good  Frequency Min 3X/week   Barriers to discharge        Co-evaluation               End of Session   Activity Tolerance: Patient tolerated treatment well Patient left: in bed;with call bell/phone within reach;with family/visitor present Nurse Communication: Mobility status         Time: 6962-9528 PT Time Calculation (min) (ACUTE ONLY): 16 min   Charges:   PT Evaluation $Initial PT Evaluation Tier I: 1 Procedure     PT G CodesClaretha Cooper 01/25/2015, 12:41 PM  Tresa Endo PT [phone]

## 2015-01-25 NOTE — Plan of Care (Signed)
Problem: Phase I Progression Outcomes Goal: Pain controlled with appropriate interventions Outcome: Completed/Met Date Met:  01/25/15 C/o pain in BUE (premorbid problem), but meds eliminating surgical pain. Goal: OOB as tolerated unless otherwise ordered Outcome: Completed/Met Date Met:  01/25/15 Seen by PT today

## 2015-01-25 NOTE — Progress Notes (Signed)
  Subjective: Feels better. Hasn't vomited since yesterday  Objective: Vital signs in last 24 hours: Temp:  [97.5 F (36.4 C)-99.2 F (37.3 C)] 98.1 F (36.7 C) (02/11 0552) Pulse Rate:  [88-110] 92 (02/11 0552) Resp:  [16] 16 (02/11 0552) BP: (93-108)/(57-83) 93/59 mmHg (02/11 0552) SpO2:  [95 %-98 %] 98 % (02/11 0552) Weight:  [135 lb 1.6 oz (61.281 kg)-144 lb 6.4 oz (65.5 kg)] 135 lb 1.6 oz (61.281 kg) (02/11 0552) Last BM Date: 01/23/15  Intake/Output from previous day: 02/10 0701 - 02/11 0700 In: 2239.6 [I.V.:2239.6] Out: 800 [Urine:800] Intake/Output this shift: Total I/O In: -  Out: 400 [Urine:400]  Resp: clear to auscultation bilaterally Cardio: regular rate and rhythm GI: soft, nontender. duodenostomy and feeding j tube intact  Lab Results:   Recent Labs  01/24/15 1228 01/25/15 0520  WBC 15.0* 9.4  HGB 11.5* 9.7*  HCT 36.9* 30.9*  PLT 288 218   BMET  Recent Labs  01/24/15 1228 01/25/15 0520  NA 136 137  K 4.0 3.6  CL 99 101  CO2 28 28  GLUCOSE 164* 184*  BUN 24* 15  CREATININE 0.77 0.69  CALCIUM 9.5 8.7   PT/INR No results for input(s): LABPROT, INR in the last 72 hours. ABG No results for input(s): PHART, HCO3 in the last 72 hours.  Invalid input(s): PCO2, PO2  Studies/Results: Dg Abd Acute W/chest  01/24/2015   CLINICAL DATA:  History lung cancer.  Nausea and vomiting.  EXAM: ACUTE ABDOMEN SERIES (ABDOMEN 2 VIEW & CHEST 1 VIEW)  COMPARISON:  01/06/2015 chest x-ray.  CT abdomen 12/11/2014  FINDINGS: Right apical streaky markings are unchanged and may be due to scarring from radiation therapy. There is volume loss the right upper lobe. Overall the lung volume is improved and there is improvement in bibasilar atelectasis since prior study. Negative for heart failure. Small right effusion.  Gastrostomy tube with inflated balloon overlying the gastric antrum. There is a left-sided abdominal catheter which was not present on the priors CT. Tip  overlies the region of the hepatic flexure.  Normal bowel gas pattern. No bowel obstruction or free air. Surgical clips in the gallbladder fossa.  IMPRESSION: Right upper lobe streaky markings most consistent with radiation scarring.  Improved aeration since the prior study with improvement in bibasilar atelectasis  Nonobstructive bowel gas pattern.   Electronically Signed   By: Franchot Gallo M.D.   On: 01/24/2015 14:35    Anti-infectives: Anti-infectives    None      Assessment/Plan: s/p * No surgery found * Will check upper GI with small bowel follow through today  Resume tube feeds  LOS: 1 day    TOTH Vega,Daryl Takayama S 01/25/2015

## 2015-01-26 LAB — GLUCOSE, CAPILLARY
GLUCOSE-CAPILLARY: 110 mg/dL — AB (ref 70–99)
GLUCOSE-CAPILLARY: 173 mg/dL — AB (ref 70–99)
Glucose-Capillary: 183 mg/dL — ABNORMAL HIGH (ref 70–99)
Glucose-Capillary: 192 mg/dL — ABNORMAL HIGH (ref 70–99)
Glucose-Capillary: 110 mg/dL — ABNORMAL HIGH (ref 70–99)

## 2015-01-26 MED ORDER — BOOST / RESOURCE BREEZE PO LIQD
1.0000 | Freq: Three times a day (TID) | ORAL | Status: DC
  Administered 2015-01-26 – 2015-01-29 (×8): 1 via ORAL

## 2015-01-26 NOTE — Progress Notes (Signed)
ANTICOAGULATION CONSULT NOTE - Follow-up Consult  Pharmacy Consult for Lovenox Indication: pulmonary embolus  No Known Allergies  Patient Measurements: Height: 5\' 8"  (172.7 cm) Weight: 144 lb 2.9 oz (65.4 kg) IBW/kg (Calculated) : 68.4 Heparin Dosing Weight:   Vital Signs: Temp: 98.4 F (36.9 C) (02/12 0541) Temp Source: Oral (02/12 0541) BP: 101/58 mmHg (02/12 0541) Pulse Rate: 86 (02/12 0541)  Labs:  Recent Labs  01/24/15 1228 01/25/15 0520  HGB 11.5* 9.7*  HCT 36.9* 30.9*  PLT 288 218  CREATININE 0.77 0.69    Estimated Creatinine Clearance: 82.9 mL/min (by C-G formula based on Cr of 0.69).   Medical History: Past Medical History  Diagnosis Date  . DM (diabetes mellitus)   . Pneumonia   . Hypercholesteremia   . Melanoma   . Radiation 08/03/14-08/23/14    35 gray to right chest  . Lung cancer   . FH: chemotherapy    Assessment: 53 yoM s/p multiple surgeries in December 2015 for bleeding duodenal ulcer, repair of chronic cholecystoduodenal fistula, and cholecystecomy.  Pt recently admitted for PE and discharged on lovenox.  Direct admit from surgery office visit for persistent N/V possibly from partial duodenal obstruction vs SBO.  Pharmacy consulted to resume lovenox dosing inpatient.    PTA lovenox 100mg  daily, LD -2/9 @ 5 or 6 PM per wife. No other antiplatelet/anticoagulants noted.  No issues noted with renal fxn.  Hgb low, may be at baseline (hx anemia), plts WNL.   Goal of Therapy:  Anti-Xa level 0.6-1 unit/ml 4 hours after last dose of LMWH Monitor platelets by anticoagulation protocol: Yes   Plan:  Continue lovenox 1.5mg /kg/day = 100mg  q24h Follow-up CBC, clinical course  Ralene Bathe, PharmD, BCPS 01/26/2015, 10:23 AM  Pager: [phone]

## 2015-01-26 NOTE — Progress Notes (Signed)
NUTRITION FOLLOW UP  Intervention:   - Continue Osmolite 1.5 @ 75 ml/hr over 12 hours (8pm-8am)  - Prostat 60 mL BID   - Provides 1750 kcal (100% of estimated energy requirements), 116 grams of protein, and 685.8 ml of H20  - If IV fluids d/c'd recommend flushes of 250 mL QID to provide additional 1000 mL of free water.   - Resource Breeze TID  Nutrition Dx:   Inadequate oral intake related to inability to eat; N/V as evidenced by NPO status; ongoing   Goal:   Pt to meet >/= 90% of estimated energy needs; met with nocturnal TF's  Monitor:   TF tolerance, labs, weight, I/O's  Assessment:   69 y/o male with PMH significant for DM, HLD, mixed type COPD; duodenal bleeding ulcer (post surgery), protein calorie malnutrition (on enteral feeding) and lung cancer (treatment ended). Presented with weight loss, and persistent nausea and vomiting.   - Pt tolerated TF overnight at goal rate- Osmolite 1.5 @ 75 mL per hour over 12 hours (8 pm-8 am)- Pt meeting 100% of need with TF.  - He reports several loose bowel movements. Mild discomfort with tube feedings.  - Duodenostomy tube supposed to be set to straight drain last pm, but didn't happen. Now set to drain. Hopefully will improve abdominal discomfort.  - No nausea at this time. Asking for diet to be advanced. - Discussed possible diet upgrade with PA to clear liquids. - Labs and medications reviewed  Height: Ht Readings from Last 1 Encounters:  01/24/15 _0  (1.727 m)    Weight Status:   Wt Readings from Last 1 Encounters:  01/26/15 144 lb 2.9 oz (65.4 kg)    Re-estimated needs:  Kcal: 1700-2080 Protein: 95-115 g Fluid:1.7-2.0 L/day  Skin: closed incision on abdomen  Diet Order: Diet NPO time specified Except for: Ice Chips   Intake/Output Summary (Last 24 hours) at 01/26/15 0956 Last data filed at 01/26/15 0600  Gross per 24 hour  Intake 2747.91 ml  Output      0 ml  Net 2747.91 ml    Last BM: 2/12 per pt  report   Labs:   Recent Labs Lab 01/24/15 1228 01/25/15 0520  NA 136 137  K 4.0 3.6  CL 99 101  CO2 28 28  BUN 24* 15  CREATININE 0.77 0.69  CALCIUM 9.5 8.7  GLUCOSE 164* 184*    CBG (last 3)   Recent Labs  01/25/15 2340 01/26/15 0355 01/26/15 0823  GLUCAP 152* 183* 192*    Scheduled Meds: . enoxaparin (LOVENOX) injection  100 mg Subcutaneous Q24H  . feeding supplement (PRO-STAT SUGAR FREE 64)  60 mL Oral BID  . insulin aspart  0-9 Units Subcutaneous 6 times per day  . pantoprazole (PROTONIX) IV  40 mg Intravenous Q12H    Continuous Infusions: . dextrose 5 % and 0.9 % NaCl with KCl 20 mEq/L 100 mL/hr at 01/26/15 0600  . feeding supplement (OSMOLITE 1.5 CAL) 1,000 mL (01/25/15 1800)    Laurette Schimke [place], [place], [place]

## 2015-01-26 NOTE — Progress Notes (Signed)
Subjective: He feels OK, felt some discomfort with the tube feedings.  We ask them to put the duodenostomy to straight drain last PM, but it didn't happen.  Day nurse has just fixed it.  He would like to try eating again.  Walked with walker at home with assist.  Objective: Vital signs in last 24 hours: Temp:  [98.4 F (36.9 C)-98.7 F (37.1 C)] 98.4 F (36.9 C) (02/12 0541) Pulse Rate:  [86-93] 86 (02/12 0541) Resp:  [16] 16 (02/12 0541) BP: (95-101)/(55-67) 101/58 mmHg (02/12 0541) SpO2:  [93 %-100 %] 93 % (02/12 0541) Weight:  [65.4 kg (144 lb 2.9 oz)] 65.4 kg (144 lb 2.9 oz) (02/12 0541) Last BM Date: 01/26/15 1 stool Nothing PO  TF not recorded yet Afebrile, VSS No labs today,Prealbumin on admit is 27.7 UGI: 1. No contrast leak is observed. 2. Foley catheter duodenostomy tube in place with distended delayed in the duodenal lumen. 3. There is some slight irregularity of the duodenal bulb, and today's exam is not able to rule out ulceration. 4. The patient has limited mobility and limited ability to drink the contrast medium -today's water-soluble exam was focused on assessing for leak in the distal stomach or duodenum and has reduced sensitivity in assessing the stomach and esophagus compared to a standard upper GI exam. Intake/Output from previous day: 02/11 0701 - 02/12 0700 In: 2747.9 [I.V.:2647.9; NG/GT:100] Out: 400 [Urine:400] Intake/Output this shift:    General appearance: alert, cooperative and no distress Resp: clear to auscultation bilaterally GI: soft, non-tender; bowel sounds normal; no masses,  no organomegaly and tubes look fine.  Lab Results:   Recent Labs  01/24/15 1228 01/25/15 0520  WBC 15.0* 9.4  HGB 11.5* 9.7*  HCT 36.9* 30.9*  PLT 288 218    BMET  Recent Labs  01/24/15 1228 01/25/15 0520  NA 136 137  K 4.0 3.6  CL 99 101  CO2 28 28  GLUCOSE 164* 184*  BUN 24* 15  CREATININE 0.77 0.69  CALCIUM 9.5 8.7   PT/INR No  results for input(s): LABPROT, INR in the last 72 hours.   Recent Labs Lab 01/24/15 1228  AST 25  ALT 37  ALKPHOS 142*  BILITOT 0.6  PROT 7.7  ALBUMIN 3.8     Lipase     Component Value Date/Time   LIPASE 41 11/29/2014 0644     Studies/Results: Dg Abd Acute W/chest  01/24/2015   CLINICAL DATA:  History lung cancer.  Nausea and vomiting.  EXAM: ACUTE ABDOMEN SERIES (ABDOMEN 2 VIEW & CHEST 1 VIEW)  COMPARISON:  01/06/2015 chest x-ray.  CT abdomen 12/11/2014  FINDINGS: Right apical streaky markings are unchanged and may be due to scarring from radiation therapy. There is volume loss the right upper lobe. Overall the lung volume is improved and there is improvement in bibasilar atelectasis since prior study. Negative for heart failure. Small right effusion.  Gastrostomy tube with inflated balloon overlying the gastric antrum. There is a left-sided abdominal catheter which was not present on the priors CT. Tip overlies the region of the hepatic flexure.  Normal bowel gas pattern. No bowel obstruction or free air. Surgical clips in the gallbladder fossa.  IMPRESSION: Right upper lobe streaky markings most consistent with radiation scarring.  Improved aeration since the prior study with improvement in bibasilar atelectasis  Nonobstructive bowel gas pattern.   Electronically Signed   By: Franchot Gallo M.D.   On: 01/24/2015 14:35   Dg Duanne Limerick W/water Sol Cm  01/25/2015   CLINICAL DATA:  Nausea and vomiting. Prior duodenal ulcer repair with cholecystoduodenal fistula repair.  EXAM: WATER SOLUBLE UPPER GI SERIES WITH KUB  TECHNIQUE: Single-column upper GI series was performed using water soluble contrast.  CONTRAST:  83mL OMNIPAQUE IOHEXOL 300 MG/ML  SOLN  COMPARISON:  01/24/2015  FLUOROSCOPY TIME:  Radiation Exposure Index (as provided by the fluoroscopic device):  If the device does not provide the exposure index:  Fluoroscopy Time (in minutes and seconds):  2 minutes, 7 seconds  Number of Acquired  Images:  1  FINDINGS: The patient has a jejunostomy tube and also a Foley catheter duodenostomy. Initial KUB demonstrates both of these tubes with the Foley catheter inflated, and with clips in the right upper quadrant. No dilated bowel. Formed stool observed in the colon.  Without explicit instructions to do so, I did not want to injected either tube. The patient had considerable nausea when trying to drink the contrast medium, retching and spitting up contrast, but with no findings of obstruction. Because of this, only limited views of the esophagus were obtained. I allow the patient to slowly sip at the oral contrast without fluoroscopy in order to provide a 60 cc bolus of contrast to the stomach. This was as much as he would tolerate drinking.  The patient has reduced mobility, but was able to turn onto is right side to allow the contrast to dump into the duodenum. This also coated the stomach, and I do not see an obvious gastric abnormality although clearly this water-soluble assessment with limited patient mobility and limited ability to administer contrast has reduced sensitivity and negative predictive value compared to the standard upper GI exam.  Contrast was noted to extend across the pylorus into surround the Foley catheter balloon as on series 14-15. The balloon does fill much of the lumen but the oral contrast extends around the balloon without significant obstruction. I obtained fluoroscopic saved images well slowly turning the patient back to the supine position in order to show the stomach antrum and proximal duodenum from a variety of angles. These demonstrated no leak. No demonstrated connection to the gallbladder was refluxed. There is some concavity to the upper portion of the duodenal bulb and residual ulceration is not readily excluded, for example on series 33.  I turned the patient up onto the left side, again obtaining images at different angles through the stomach antrum and proximal  duodenum, without compelling findings of leak. No proximal small bowel distention.  IMPRESSION: 1. No contrast leak is observed. 2. Foley catheter duodenostomy tube in place with distended delayed in the duodenal lumen. 3. There is some slight irregularity of the duodenal bulb, and today's exam is not able to rule out ulceration. 4. The patient has limited mobility and limited ability to drink the contrast medium -today's water-soluble exam was focused on assessing for leak in the distal stomach or duodenum and has reduced sensitivity in assessing the stomach and esophagus compared to a standard upper GI exam.   Electronically Signed   By: Van Clines M.D.   On: 01/25/2015 11:00    Medications: . enoxaparin (LOVENOX) injection  100 mg Subcutaneous Q24H  . feeding supplement (PRO-STAT SUGAR FREE 64)  60 mL Oral BID  . insulin aspart  0-9 Units Subcutaneous 6 times per day  . pantoprazole (PROTONIX) IV  40 mg Intravenous Q12H    Assessment/Plan 1. Recurrent nausea and vomiting 2. Bleeding duodenal ulcer with cholecystoduodenal fistula --EGD for control of bleeding -  11/30/16 - [name] --Ex Lap, oversewing of posterior bleeding DU, Heineke Mikulicz pyloroplasty, cholecystectomy, repair of right hepatic artery, repair of cholecystoduodenal fistula and placement of duodenostomy tube, feeding jejunostomy - 11/30/14 - Dr. Jackolyn Confer 3. Left pulmonary embolism 4. 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 (07/2014) 5. 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  He is on Lovenox/SCD for PE/DVT   Plan:  I will start him on clears, and see how he does with D tube open to drain and then if doing well with D tube clamped.   LOS: 2 days    Daryl Vega 01/26/2015

## 2015-01-27 LAB — GLUCOSE, CAPILLARY
GLUCOSE-CAPILLARY: 124 mg/dL — AB (ref 70–99)
GLUCOSE-CAPILLARY: 135 mg/dL — AB (ref 70–99)
GLUCOSE-CAPILLARY: 178 mg/dL — AB (ref 70–99)
GLUCOSE-CAPILLARY: 181 mg/dL — AB (ref 70–99)
Glucose-Capillary: 180 mg/dL — ABNORMAL HIGH (ref 70–99)
Glucose-Capillary: 139 mg/dL — ABNORMAL HIGH (ref 70–99)

## 2015-01-27 NOTE — Progress Notes (Signed)
ANTICOAGULATION CONSULT NOTE - Follow-up Consult  Pharmacy Consult for Lovenox Indication:  Chronic Lovenox for hx PE  No Known Allergies  Patient Measurements: Height: 5\' 8"  (172.7 cm) Weight: 145 lb 8.1 oz (66 kg) IBW/kg (Calculated) : 68.4  Vital Signs: Temp: 98.8 F (37.1 C) (02/13 0546) Temp Source: Oral (02/13 0546) BP: 113/67 mmHg (02/13 0546) Pulse Rate: 86 (02/13 0546)  Labs:  Recent Labs  01/24/15 1228 01/25/15 0520  HGB 11.5* 9.7*  HCT 36.9* 30.9*  PLT 288 218  CREATININE 0.77 0.69   Estimated Creatinine Clearance: 83.6 mL/min (by C-G formula based on Cr of 0.69).  Medical History: Past Medical History  Diagnosis Date  . DM (diabetes mellitus)   . Pneumonia   . Hypercholesteremia   . Melanoma   . Radiation 08/03/14-08/23/14    35 gray to right chest  . Lung cancer   . FH: chemotherapy    Assessment: 50 yoM s/p multiple surgeries in December 2015 for bleeding duodenal ulcer, repair of chronic cholecystoduodenal fistula, and cholecystecomy. Pt recently admitted for PE and discharged on lovenox. Direct admit from surgery office visit for persistent N/V possibly from partial duodenal obstruction vs SBO.  Pharmacy consulted to resume lovenox dosing inpatient.    PTA lovenox 100mg  daily, LD -2/9 @ 5 or 6 PM per wife. No other antiplatelet/anticoagulants noted.  On admit: Hgb low, may be at baseline (hx anemia), plts WNL, renal function wnl.  Goal of Therapy:  Anti-Xa level 0.6-1 unit/ml 4 hours after last dose of LMWH Monitor platelets by anticoagulation protocol: Yes   Plan:  Continue lovenox 1.5mg /kg/day = 100mg  q24h CBC tomorrow  Minda Ditto PharmD Pager [phone] 01/27/2015, 8:12 AM

## 2015-01-27 NOTE — Progress Notes (Signed)
Patient ID: Daryl Vega, male   DOB: May 29, 1946, 69 y.o.   MRN: [id number] [facility] Surgery Progress Note:   * No surgery found *  Subjective: Mental status is alert and cooperative Objective: Vital signs in last 24 hours: Temp:  [98.8 F (37.1 C)-99 F (37.2 C)] 98.8 F (37.1 C) (02/13 0546) Pulse Rate:  [86-95] 86 (02/13 0546) Resp:  [16] 16 (02/13 0546) BP: (102-114)/(60-67) 113/67 mmHg (02/13 0546) SpO2:  [94 %-96 %] 94 % (02/13 0546) Weight:  [145 lb 8.1 oz (66 kg)] 145 lb 8.1 oz (66 kg) (02/13 0445)  Intake/Output from previous day: 02/12 0701 - 02/13 0700 In: 2840 [P.O.:840; I.V.:2000] Out: 1775 [Urine:1400; Drains:375] Intake/Output this shift: Total I/O In: -  Out: 550 [Urine:450; Drains:100]  Physical Exam: Work of breathing is not labored.  Receiving tube feedings and he was begun on clear liquids yesterday and is tolerating Jello.  Midline incision has healed.  Duodenostomy tube and jejunostomy tubes are in place.    Lab Results:  Results for orders placed or performed during the hospital encounter of 01/24/15 (from the past 48 hour(s))  Glucose, capillary     Status: Abnormal   Collection Time: 01/25/15 12:37 PM  Result Value Ref Range   Glucose-Capillary 109 (H) 70 - 99 mg/dL  Glucose, capillary     Status: Abnormal   Collection Time: 01/25/15  4:48 PM  Result Value Ref Range   Glucose-Capillary 124 (H) 70 - 99 mg/dL  Urinalysis, Routine w reflex microscopic     Status: None   Collection Time: 01/25/15  5:50 PM  Result Value Ref Range   Color, Urine YELLOW YELLOW   APPearance CLEAR CLEAR   Specific Gravity, Urine 1.017 1.005 - 1.030   pH 6.5 5.0 - 8.0   Glucose, UA NEGATIVE NEGATIVE mg/dL   Hgb urine dipstick NEGATIVE NEGATIVE   Bilirubin Urine NEGATIVE NEGATIVE   Ketones, ur NEGATIVE NEGATIVE mg/dL   Protein, ur NEGATIVE NEGATIVE mg/dL   Urobilinogen, UA 1.0 0.0 - 1.0 mg/dL   Nitrite NEGATIVE NEGATIVE   Leukocytes, UA NEGATIVE NEGATIVE    Comment: MICROSCOPIC NOT DONE ON URINES WITH NEGATIVE PROTEIN, BLOOD, LEUKOCYTES, NITRITE, OR GLUCOSE <1000 mg/dL.  Glucose, capillary     Status: Abnormal   Collection Time: 01/25/15  7:56 PM  Result Value Ref Range   Glucose-Capillary 186 (H) 70 - 99 mg/dL  Glucose, capillary     Status: Abnormal   Collection Time: 01/25/15 11:40 PM  Result Value Ref Range   Glucose-Capillary 152 (H) 70 - 99 mg/dL  Glucose, capillary     Status: Abnormal   Collection Time: 01/26/15  3:55 AM  Result Value Ref Range   Glucose-Capillary 183 (H) 70 - 99 mg/dL  Glucose, capillary     Status: Abnormal   Collection Time: 01/26/15  8:23 AM  Result Value Ref Range   Glucose-Capillary 192 (H) 70 - 99 mg/dL  Glucose, capillary     Status: Abnormal   Collection Time: 01/26/15 12:32 PM  Result Value Ref Range   Glucose-Capillary 110 (H) 70 - 99 mg/dL  Glucose, capillary     Status: Abnormal   Collection Time: 01/26/15  4:34 PM  Result Value Ref Range   Glucose-Capillary 110 (H) 70 - 99 mg/dL  Glucose, capillary     Status: Abnormal   Collection Time: 01/26/15  7:48 PM  Result Value Ref Range   Glucose-Capillary 173 (H) 70 - 99 mg/dL  Glucose, capillary  Status: Abnormal   Collection Time: 01/27/15 12:14 AM  Result Value Ref Range   Glucose-Capillary 178 (H) 70 - 99 mg/dL  Glucose, capillary     Status: Abnormal   Collection Time: 01/27/15  4:11 AM  Result Value Ref Range   Glucose-Capillary 180 (H) 70 - 99 mg/dL  Glucose, capillary     Status: Abnormal   Collection Time: 01/27/15  7:37 AM  Result Value Ref Range   Glucose-Capillary 181 (H) 70 - 99 mg/dL    Radiology/Results: Dg Ugi W/water Sol Cm  01/25/2015   CLINICAL DATA:  Nausea and vomiting. Prior duodenal ulcer repair with cholecystoduodenal fistula repair.  EXAM: WATER SOLUBLE UPPER GI SERIES WITH KUB  TECHNIQUE: Single-column upper GI series was performed using water soluble contrast.  CONTRAST:  35mL OMNIPAQUE IOHEXOL 300 MG/ML  SOLN   COMPARISON:  01/24/2015  FLUOROSCOPY TIME:  Radiation Exposure Index (as provided by the fluoroscopic device):  If the device does not provide the exposure index:  Fluoroscopy Time (in minutes and seconds):  2 minutes, 7 seconds  Number of Acquired Images:  1  FINDINGS: The patient has a jejunostomy tube and also a Foley catheter duodenostomy. Initial KUB demonstrates both of these tubes with the Foley catheter inflated, and with clips in the right upper quadrant. No dilated bowel. Formed stool observed in the colon.  Without explicit instructions to do so, I did not want to injected either tube. The patient had considerable nausea when trying to drink the contrast medium, retching and spitting up contrast, but with no findings of obstruction. Because of this, only limited views of the esophagus were obtained. I allow the patient to slowly sip at the oral contrast without fluoroscopy in order to provide a 60 cc bolus of contrast to the stomach. This was as much as he would tolerate drinking.  The patient has reduced mobility, but was able to turn onto is right side to allow the contrast to dump into the duodenum. This also coated the stomach, and I do not see an obvious gastric abnormality although clearly this water-soluble assessment with limited patient mobility and limited ability to administer contrast has reduced sensitivity and negative predictive value compared to the standard upper GI exam.  Contrast was noted to extend across the pylorus into surround the Foley catheter balloon as on series 14-15. The balloon does fill much of the lumen but the oral contrast extends around the balloon without significant obstruction. I obtained fluoroscopic saved images well slowly turning the patient back to the supine position in order to show the stomach antrum and proximal duodenum from a variety of angles. These demonstrated no leak. No demonstrated connection to the gallbladder was refluxed. There is some concavity  to the upper portion of the duodenal bulb and residual ulceration is not readily excluded, for example on series 33.  I turned the patient up onto the left side, again obtaining images at different angles through the stomach antrum and proximal duodenum, without compelling findings of leak. No proximal small bowel distention.  IMPRESSION: 1. No contrast leak is observed. 2. Foley catheter duodenostomy tube in place with distended delayed in the duodenal lumen. 3. There is some slight irregularity of the duodenal bulb, and today's exam is not able to rule out ulceration. 4. The patient has limited mobility and limited ability to drink the contrast medium -today's water-soluble exam was focused on assessing for leak in the distal stomach or duodenum and has reduced sensitivity in assessing the  stomach and esophagus compared to a standard upper GI exam.   Electronically Signed   By: Van Clines M.D.   On: 01/25/2015 11:00    Anti-infectives: Anti-infectives    None      Assessment/Plan: Problem List: Patient Active Problem List   Diagnosis Date Noted  . Failure to thrive in adult 01/24/2015  . Pulmonary embolism 01/06/2015  . Enteritis due to Clostridium difficile 12/19/2014  . Hyperglycemia   . Protein-calorie malnutrition, severe 11/30/2014  . 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 11/30/2014  . Bleeding duodenal ulcer s/p ex lap/oversew 11/30/2014 11/30/2014  . Acute respiratory failure with hypoxia   . Anemia 11/29/2014  . Hypokalemia 10/25/2014  . Diarrhea 10/25/2014  . Anemia associated with chemotherapy 10/25/2014  . SOB (shortness of breath) 10/22/2014  . Sinus tachycardia 10/22/2014  . Diabetes mellitus 10/22/2014  . Hyperlipidemia 10/22/2014  . Lung cancer 07/28/2014  . 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 Cough 07/17/2014  . COPD mixed type 06/28/2014  . Post-obstructive CAP (community  acquired pneumonia) 06/27/2014    Tolerating clears thus far.  Continue advancement of liquids.   * No surgery found *    LOS: 3 days   Matt B. Hassell Done, MD, [facility] [phone] beeper [phone]  01/27/2015 9:13 AM

## 2015-01-28 LAB — GLUCOSE, CAPILLARY
GLUCOSE-CAPILLARY: 115 mg/dL — AB (ref 70–99)
GLUCOSE-CAPILLARY: 192 mg/dL — AB (ref 70–99)
GLUCOSE-CAPILLARY: 210 mg/dL — AB (ref 70–99)
Glucose-Capillary: 117 mg/dL — ABNORMAL HIGH (ref 70–99)
Glucose-Capillary: 152 mg/dL — ABNORMAL HIGH (ref 70–99)
Glucose-Capillary: 177 mg/dL — ABNORMAL HIGH (ref 70–99)
Glucose-Capillary: 194 mg/dL — ABNORMAL HIGH (ref 70–99)

## 2015-01-28 LAB — CBC
HCT: 32.2 % — ABNORMAL LOW (ref 39.0–52.0)
HEMOGLOBIN: 10.1 g/dL — AB (ref 13.0–17.0)
MCH: 28 pg (ref 26.0–34.0)
MCHC: 31.4 g/dL (ref 30.0–36.0)
MCV: 89.2 fL (ref 78.0–100.0)
PLATELETS: 196 10*3/uL (ref 150–400)
RBC: 3.61 MIL/uL — ABNORMAL LOW (ref 4.22–5.81)
RDW: 16.6 % — ABNORMAL HIGH (ref 11.5–15.5)
WBC: 4.7 10*3/uL (ref 4.0–10.5)

## 2015-01-28 NOTE — Progress Notes (Signed)
Patient ID: Daryl Vega, male   DOB: Nov 28, 1946, 69 y.o.   MRN: [id number] [facility] Surgery Progress Note:   * No surgery found *  Subjective: Mental status is clear Objective: Vital signs in last 24 hours: Temp:  [98.2 F (36.8 C)-98.9 F (37.2 C)] 98.2 F (36.8 C) (02/14 0617) Pulse Rate:  [85-94] 94 (02/14 0617) Resp:  [16] 16 (02/14 0617) BP: (98-104)/(59-68) 98/68 mmHg (02/14 0617) SpO2:  [95 %-98 %] 95 % (02/14 0617) Weight:  [144 lb 6.4 oz (65.5 kg)] 144 lb 6.4 oz (65.5 kg) (02/14 0617)  Intake/Output from previous day: 02/13 0701 - 02/14 0700 In: 3640 [P.O.:840; I.V.:2800] Out: 3095 [Urine:2175; Drains:920] Intake/Output this shift:    Physical Exam: Work of breathing is not labored.  Wants duodenostomy tube clamped.  Having small Bms.    Lab Results:  Results for orders placed or performed during the hospital encounter of 01/24/15 (from the past 48 hour(s))  Glucose, capillary     Status: Abnormal   Collection Time: 01/26/15 12:32 PM  Result Value Ref Range   Glucose-Capillary 110 (H) 70 - 99 mg/dL  Glucose, capillary     Status: Abnormal   Collection Time: 01/26/15  4:34 PM  Result Value Ref Range   Glucose-Capillary 110 (H) 70 - 99 mg/dL  Glucose, capillary     Status: Abnormal   Collection Time: 01/26/15  7:48 PM  Result Value Ref Range   Glucose-Capillary 173 (H) 70 - 99 mg/dL  Glucose, capillary     Status: Abnormal   Collection Time: 01/27/15 12:14 AM  Result Value Ref Range   Glucose-Capillary 178 (H) 70 - 99 mg/dL  Glucose, capillary     Status: Abnormal   Collection Time: 01/27/15  4:11 AM  Result Value Ref Range   Glucose-Capillary 180 (H) 70 - 99 mg/dL  Glucose, capillary     Status: Abnormal   Collection Time: 01/27/15  7:37 AM  Result Value Ref Range   Glucose-Capillary 181 (H) 70 - 99 mg/dL  Glucose, capillary     Status: Abnormal   Collection Time: 01/27/15 12:56 PM  Result Value Ref Range   Glucose-Capillary 124 (H) 70 - 99  mg/dL  Glucose, capillary     Status: Abnormal   Collection Time: 01/27/15  4:11 PM  Result Value Ref Range   Glucose-Capillary 135 (H) 70 - 99 mg/dL  Glucose, capillary     Status: Abnormal   Collection Time: 01/27/15  8:15 PM  Result Value Ref Range   Glucose-Capillary 139 (H) 70 - 99 mg/dL   Comment 1 Notify RN   Glucose, capillary     Status: Abnormal   Collection Time: 01/28/15 12:30 AM  Result Value Ref Range   Glucose-Capillary 192 (H) 70 - 99 mg/dL  CBC     Status: Abnormal   Collection Time: 01/28/15  5:46 AM  Result Value Ref Range   WBC 4.7 4.0 - 10.5 K/uL   RBC 3.61 (L) 4.22 - 5.81 MIL/uL   Hemoglobin 10.1 (L) 13.0 - 17.0 g/dL   HCT 32.2 (L) 39.0 - 52.0 %   MCV 89.2 78.0 - 100.0 fL   MCH 28.0 26.0 - 34.0 pg   MCHC 31.4 30.0 - 36.0 g/dL   RDW 16.6 (H) 11.5 - 15.5 %   Platelets 196 150 - 400 K/uL  Glucose, capillary     Status: Abnormal   Collection Time: 01/28/15  7:44 AM  Result Value Ref Range   Glucose-Capillary  210 (H) 70 - 99 mg/dL    Radiology/Results: No results found.  Anti-infectives: Anti-infectives    None      Assessment/Plan: Problem List: Patient Active Problem List   Diagnosis Date Noted  . Failure to thrive in adult 01/24/2015  . Pulmonary embolism 01/06/2015  . Enteritis due to Clostridium difficile 12/19/2014  . Hyperglycemia   . Protein-calorie malnutrition, severe 11/30/2014  . 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 11/30/2014  . Bleeding duodenal ulcer s/p ex lap/oversew 11/30/2014 11/30/2014  . Acute respiratory failure with hypoxia   . Anemia 11/29/2014  . Hypokalemia 10/25/2014  . Diarrhea 10/25/2014  . Anemia associated with chemotherapy 10/25/2014  . SOB (shortness of breath) 10/22/2014  . Sinus tachycardia 10/22/2014  . Diabetes mellitus 10/22/2014  . Hyperlipidemia 10/22/2014  . Lung cancer 07/28/2014  . 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  Cough 07/17/2014  . COPD mixed type 06/28/2014  . Post-obstructive CAP (community acquired pneumonia) 06/27/2014    Continue clears and clamp tube.  Continue tube feedings * No surgery found *    LOS: 4 days   Matt B. Hassell Done, MD, [facility] [phone] beeper [phone]  01/28/2015 8:41 AM

## 2015-01-28 NOTE — Progress Notes (Signed)
Physical Therapy Treatment Patient Details Name: Daryl Vega MRN: [id number] DOB: 11-Oct-1946 Today's Date: 01/28/2015    History of Present Illness 69 yo male with history of  lung cancer, admitted 01/06/15 with fall, N/V, on tube feedings, positive for PE last admit.     PT Comments    Patient motivated and mobilizing well, amb. X 850'.   Follow Up Recommendations  Home health PT;Supervision/Assistance - 24 hour     Equipment Recommendations  None recommended by PT    Recommendations for Other Services       Precautions / Restrictions Precautions Precautions: Fall Precaution Comments: drain from abd    Mobility  Bed Mobility Overal bed mobility: Modified Independent             General bed mobility comments: able to self perform  Transfers   Equipment used: Rolling walker (2 wheeled) Transfers: Sit to/from Stand Sit to Stand: Supervision            Ambulation/Gait Ambulation/Gait assistance: Supervision Ambulation Distance (Feet): 850 Feet Assistive device: Rolling walker (2 wheeled) Gait Pattern/deviations: Step-through pattern Gait velocity: [facility]       Stairs            Wheelchair Mobility    Modified Rankin (Stroke Patients Only)       Balance                                    Cognition Arousal/Alertness: Awake/alert                          Exercises      General Comments        Pertinent Vitals/Pain Pain Score: 5  Pain Location: abdoomen Pain Descriptors / Indicators: Aching;Discomfort  Dyspnea 2-3/4    Home Living                      Prior Function            PT Goals (current goals can now be found in the care plan section) Progress towards PT goals: Progressing toward goals    Frequency  Min 3X/week    PT Plan Current plan remains appropriate    Co-evaluation             End of Session   Activity Tolerance: Patient tolerated treatment well Patient  left: in bed;with call bell/phone within reach     Time: 1415-1434 PT Time Calculation (min) (ACUTE ONLY): 19 min  Charges:  $Gait Training: 8-22 mins                    G Codes:      Claretha Cooper 01/28/2015, 2:38 PM Tresa Endo PT [phone]

## 2015-01-28 NOTE — Progress Notes (Signed)
Pt. Denied nausea from 7am-7pm shift while Duodenostomy tube clamped. Tolerated fair amount of clear liquids.

## 2015-01-28 NOTE — Progress Notes (Signed)
ANTICOAGULATION CONSULT NOTE - Follow-up Consult  Pharmacy Consult for Lovenox Indication:  Chronic Lovenox for hx PE  No Known Allergies  Patient Measurements: Height: 5\' 8"  (172.7 cm) Weight: 144 lb 6.4 oz (65.5 kg) IBW/kg (Calculated) : 68.4  Vital Signs: Temp: 98.2 F (36.8 C) (02/14 0617) Temp Source: Oral (02/14 0617) BP: 98/68 mmHg (02/14 0617) Pulse Rate: 94 (02/14 0617)  Labs:  Recent Labs  01/28/15 0546  HGB 10.1*  HCT 32.2*  PLT 196   Estimated Creatinine Clearance: 83 mL/min (by C-G formula based on Cr of 0.69).  Medical History: Past Medical History  Diagnosis Date  . DM (diabetes mellitus)   . Pneumonia   . Hypercholesteremia   . Melanoma   . Radiation 08/03/14-08/23/14    35 gray to right chest  . Lung cancer   . FH: chemotherapy    Assessment: 77 yoM s/p multiple surgeries in December 2015 for bleeding duodenal ulcer, repair of chronic cholecystoduodenal fistula, and cholecystecomy. Pt recently admitted for PE and discharged on lovenox. Direct admit from surgery office visit for persistent N/V possibly from partial duodenal obstruction vs SBO.  Pharmacy consulted to resume lovenox dosing inpatient.    PTA lovenox 100mg  daily, LD -2/9 @ 5 or 6 PM per wife. No other antiplatelet/anticoagulants noted.  On admit: Hgb low, may be at baseline (hx anemia), plts WNL, renal function wnl.  Today 2/14:  CBC low stable, no sign of bleed  Goal of Therapy:  Anti-Xa level 0.6-1 unit/ml 4 hours after last dose of LMWH Monitor platelets by anticoagulation protocol: Yes   Plan:   Continue lovenox 1.5mg /kg/day = 100mg  q24h   Minda Ditto PharmD Pager [phone] 01/28/2015, 8:01 AM

## 2015-01-29 LAB — CREATININE, SERUM
Creatinine, Ser: 0.55 mg/dL (ref 0.50–1.35)
GFR calc non Af Amer: >90 mL/min (ref >90)

## 2015-01-29 LAB — GLUCOSE, CAPILLARY
GLUCOSE-CAPILLARY: 248 mg/dL — AB (ref 70–99)
GLUCOSE-CAPILLARY: 149 mg/dL — AB (ref 70–99)
Glucose-Capillary: 185 mg/dL — ABNORMAL HIGH (ref 70–99)
Glucose-Capillary: 157 mg/dL — ABNORMAL HIGH (ref 70–99)
Glucose-Capillary: 183 mg/dL — ABNORMAL HIGH (ref 70–99)

## 2015-01-29 MED ORDER — GLUCERNA SHAKE PO LIQD
237.0000 mL | Freq: Two times a day (BID) | ORAL | Status: DC
  Administered 2015-01-29: 237 mL via ORAL
  Filled 2015-01-29 (×7): qty 237

## 2015-01-29 MED ORDER — GLIMEPIRIDE 4 MG PO TABS
4.0000 mg | ORAL_TABLET | Freq: Two times a day (BID) | ORAL | Status: DC
  Administered 2015-01-30 – 2015-02-01 (×5): 4 mg via ORAL
  Filled 2015-01-29 (×9): qty 1

## 2015-01-29 MED ORDER — BOOST / RESOURCE BREEZE PO LIQD
1.0000 | Freq: Two times a day (BID) | ORAL | Status: DC
  Administered 2015-01-30 – 2015-02-01 (×5): 1 via ORAL

## 2015-01-29 MED ORDER — PANTOPRAZOLE SODIUM 40 MG PO TBEC
40.0000 mg | DELAYED_RELEASE_TABLET | Freq: Two times a day (BID) | ORAL | Status: DC
  Administered 2015-01-29 – 2015-02-01 (×7): 40 mg via ORAL
  Filled 2015-01-29 (×8): qty 1

## 2015-01-29 MED ORDER — DAPAGLIFLOZIN PROPANEDIOL 5 MG PO TABS
5.0000 mg | ORAL_TABLET | Freq: Two times a day (BID) | ORAL | Status: DC
  Filled 2015-01-29 (×9): qty 5

## 2015-01-29 MED ORDER — MORPHINE SULFATE 2 MG/ML IJ SOLN: 2.0000 mg | INTRAMUSCULAR | Status: DC | PRN

## 2015-01-29 MED ORDER — BUDESONIDE-FORMOTEROL FUMARATE 160-4.5 MCG/ACT IN AERO
2.0000 | INHALATION_SPRAY | Freq: Two times a day (BID) | RESPIRATORY_TRACT | Status: DC
  Administered 2015-01-29 – 2015-01-31 (×5): 2 via RESPIRATORY_TRACT
  Filled 2015-01-29: qty 6

## 2015-01-29 MED ORDER — OXYCODONE-ACETAMINOPHEN 5-325 MG PO TABS: 1.0000 | ORAL_TABLET | ORAL | Status: DC | PRN

## 2015-01-29 MED ORDER — LORAZEPAM 0.5 MG PO TABS: 0.5000 mg | ORAL_TABLET | Freq: Two times a day (BID) | ORAL | Status: DC | PRN

## 2015-01-29 MED ORDER — MORPHINE SULFATE 15 MG PO TABS
15.0000 mg | ORAL_TABLET | Freq: Four times a day (QID) | ORAL | Status: DC | PRN
  Administered 2015-01-29 – 2015-02-01 (×10): 15 mg via ORAL
  Filled 2015-01-29 (×11): qty 1

## 2015-01-29 MED ORDER — PRO-STAT SUGAR FREE PO LIQD
60.0000 mL | Freq: Two times a day (BID) | ORAL | Status: DC
  Administered 2015-01-29 – 2015-02-01 (×6): 60 mL
  Filled 2015-01-29 (×8): qty 60

## 2015-01-29 MED ORDER — DULOXETINE HCL 20 MG PO CPEP
20.0000 mg | ORAL_CAPSULE | Freq: Every day | ORAL | Status: DC
  Administered 2015-01-29 – 2015-02-01 (×4): 20 mg via ORAL
  Filled 2015-01-29 (×4): qty 1

## 2015-01-29 MED ORDER — TEMAZEPAM 15 MG PO CAPS: 15.0000 mg | ORAL_CAPSULE | Freq: Every evening | ORAL | Status: DC | PRN

## 2015-01-29 MED ORDER — SACCHAROMYCES BOULARDII 250 MG PO CAPS
250.0000 mg | ORAL_CAPSULE | Freq: Two times a day (BID) | ORAL | Status: DC
  Administered 2015-01-29 – 2015-02-01 (×6): 250 mg via ORAL
  Filled 2015-01-29 (×7): qty 1

## 2015-01-29 MED ORDER — METFORMIN HCL 500 MG PO TABS
500.0000 mg | ORAL_TABLET | Freq: Two times a day (BID) | ORAL | Status: DC
  Administered 2015-01-30 – 2015-02-01 (×5): 500 mg via ORAL
  Filled 2015-01-29 (×9): qty 1

## 2015-01-29 NOTE — Progress Notes (Signed)
NUTRITION FOLLOW UP  Intervention:   - Continue Osmolite 1.5 @ 75 ml/hr over 12 hours (8pm-8am)  - Prostat 60 mL BID- will change to "per tube" as pt does not like the taste- discussed with RN  - Provides 1750 kcal (100% of estimated energy requirements), 116 grams of protein, and 685.8 ml of H20  - If IV fluids d/c'd recommend flushes of 250 mL QID to provide additional 1000 mL of free water.   - Recommend carb modified full liquid diet  - Reduce Resource Breeze to BID  - Glucerna Shake po BID, each supplement provides 220 kcal and 10 grams of protein  - Provide sugar-free jello with trays vs regular.   Nutrition Dx:   Inadequate oral intake related to inability to eat; N/V as evidenced by NPO status; ongoing   Goal:   Pt to meet >/= 90% of estimated energy needs; met with nocturnal TF's  Monitor:   TF tolerance, labs, weight, I/O's  Assessment:   69 y/o male with PMH significant for DM, HLD, mixed type COPD; duodenal bleeding ulcer (post surgery), protein calorie malnutrition (on enteral feeding) and lung cancer (treatment ended). Presented with weight loss, and persistent nausea and vomiting.   - Pt tolerating TF overnight at goal rate- Osmolite 1.5 @ 75 mL per hour over 12 hours (8 pm-8 am)- Pt meeting 100% of estimated needs with TF.  - BG has been elevated, consult received to help pt decrease blood glucose with lower-carbohydrate full liquid choices.  - Pt is a very picky eater. He only wants [employer], [place] ice, tomato soup and jello. Recommend changing diet to carb modified full liquids to limit number of carbohydrate choices per meal. Will decrease Resource Breeze to BID. Pt agreed to try Glucerna Shakes.  - Pt does not like the taste of Prostat. Asked RN to give to pt via tube.  - Spoke with surgery about carb modified diet. Will consider tomorrow.  - Labs reviewed. CBGs: 117-248  Height: Ht Readings from Last 1 Encounters:  01/24/15 $RemoveB'5\' 8"'aqqtOpOJ$  (1.727 m)     Weight Status:   Wt Readings from Last 1 Encounters:  01/29/15 146 lb 13.2 oz (66.6 kg)    Re-estimated needs:  Kcal: 1700-2080 Protein: 95-115 g Fluid:1.7-2.0 L/day  Skin: closed incision on abdomen  Diet Order: Diet full liquid   Intake/Output Summary (Last 24 hours) at 01/29/15 1348 Last data filed at 01/29/15 1030  Gross per 24 hour  Intake   3765 ml  Output   3200 ml  Net    565 ml    Last BM: 2/14   Labs:   Recent Labs Lab 01/24/15 1228 01/25/15 0520 01/29/15 0655  NA 136 137  --   K 4.0 3.6  --   CL 99 101  --   CO2 28 28  --   BUN 24* 15  --   CREATININE 0.77 0.69 0.55  CALCIUM 9.5 8.7  --   GLUCOSE 164* 184*  --     CBG (last 3)   Recent Labs  01/29/15 0421 01/29/15 0730 01/29/15 1155  GLUCAP 248* 185* 157*    Scheduled Meds: . budesonide-formoterol  2 puff Inhalation BID  . dapagliflozin propanediol  5 mg Oral BID  . DULoxetine  20 mg Oral Daily  . enoxaparin (LOVENOX) injection  100 mg Subcutaneous Q24H  . feeding supplement (PRO-STAT SUGAR FREE 64)  60 mL Oral BID  . feeding supplement (RESOURCE BREEZE)  1 Container Oral  TID BM  . glimepiride  4 mg Oral BID AC  . insulin aspart  0-9 Units Subcutaneous 6 times per day  . metFORMIN  500 mg Oral BID WC  . pantoprazole  40 mg Oral BID  . saccharomyces boulardii  250 mg Oral BID    Continuous Infusions: . dextrose 5 % and 0.9 % NaCl with KCl 20 mEq/L 100 mL/hr at 01/29/15 0318  . feeding supplement (OSMOLITE 1.5 CAL) 1,000 mL (01/28/15 2013)    Laurette Schimke [place], [place], [place]

## 2015-01-29 NOTE — Progress Notes (Signed)
ANTICOAGULATION CONSULT NOTE - Follow-up Consult  Pharmacy Consult for Lovenox Indication:  Chronic Lovenox for hx PE  No Known Allergies  Patient Measurements: Height: 5\' 8"  (172.7 cm) Weight: 146 lb 13.2 oz (66.6 kg) IBW/kg (Calculated) : 68.4  Vital Signs: Temp: 98.2 F (36.8 C) (02/15 0500) Temp Source: Oral (02/15 0500) BP: 104/53 mmHg (02/15 0500) Pulse Rate: 86 (02/15 0500)  Labs:  Recent Labs  01/28/15 0546 01/29/15 0655  HGB 10.1*  --   HCT 32.2*  --   PLT 196  --   CREATININE  --  0.55   Estimated Creatinine Clearance: 84.4 mL/min (by C-G formula based on Cr of 0.55).  Medical History: Past Medical History  Diagnosis Date  . DM (diabetes mellitus)   . Pneumonia   . Hypercholesteremia   . Melanoma   . Radiation 08/03/14-08/23/14    35 gray to right chest  . Lung cancer   . FH: chemotherapy    Assessment: 47 yoM s/p multiple surgeries in December 2015 for bleeding duodenal ulcer, repair of chronic cholecystoduodenal fistula, and cholecystecomy. Pt recently admitted for PE and discharged on Lovenox. Direct admit from surgeon office visit for persistent N/V possibly from partial duodenal obstruction vs SBO.  Pharmacy consulted to resume Lovenox dosing inpatient.    PTA lovenox 100mg  daily, LD -2/9 @ 5 or 6 PM per wife. No other antiplatelet/anticoagulants noted.  On admit: Hgb low, may be at baseline (hx anemia), plts WNL, renal function wnl.  Today, 01/29/2015: Remains on Lovenox 100 mg (1.5mg /kg) SQ q24h Hgb yesterday low but improving Pltc yesterday WNL SCr today WNL.  CrCl well above 30 mL/min threshold  No bleeding reported in notes  Plan: 1. Continue Lovenox 100 mg (1.5mg /kg) SQ q24h. 2. Follow H/H, Pltc, SCr at least every 3 days while inpatient. 3. Follow clinical course.  Clayburn Pert, PharmD, BCPS Pager: [phone] 01/29/2015  10:16 AM

## 2015-01-29 NOTE — Progress Notes (Signed)
Subjective: He looks fine, he had the tube unclamped because he felt full, no nausea.  Ask about going home.  Having BM's.    Objective: Vital signs in last 24 hours: Temp:  [97.9 F (36.6 C)-98.2 F (36.8 C)] 98.2 F (36.8 C) (02/15 0500) Pulse Rate:  [86-93] 86 (02/15 0500) Resp:  [16] 16 (02/15 0500) BP: (97-104)/(53-61) 104/53 mmHg (02/15 0500) SpO2:  [96 %-98 %] 98 % (02/15 0500) Weight:  [66.6 kg (146 lb 13.2 oz)] 66.6 kg (146 lb 13.2 oz) (02/15 8182) Last BM Date: 01/28/15 600 Po recorded yesterday 2 stools recorded yesterday Afebrile, BP low but stable Glucose is high On a clear diet . dextrose 5 % and 0.9 % NaCl with KCl 20 mEq/L 100 mL/hr at 01/29/15 0318  . feeding supplement (OSMOLITE 1.5 CAL) 1,000 mL (01/28/15 2013)  No labs Intake/Output from previous day: 02/14 0701 - 02/15 0700 In: 3050 [P.O.:600; I.V.:2300; NG/GT:150] Out: 3450 [Urine:3300; Drains:150] Intake/Output this shift: Total I/O In: 1075 [P.O.:325; NG/GT:750] Out: -   General appearance: alert, cooperative and no distress Resp: clear to auscultation bilaterally GI: soft, non-tender; bowel sounds normal; no masses,  no organomegaly and drain/tube site looks fine.  No distension, wound healed.  + BS, +BM  Lab Results:   Recent Labs  01/28/15 0546  WBC 4.7  HGB 10.1*  HCT 32.2*  PLT 196    BMET  Recent Labs  01/29/15 0655  CREATININE 0.55   PT/INR No results for input(s): LABPROT, INR in the last 72 hours.   Recent Labs Lab 01/24/15 1228  AST 25  ALT 37  ALKPHOS 142*  BILITOT 0.6  PROT 7.7  ALBUMIN 3.8     Lipase     Component Value Date/Time   LIPASE 41 11/29/2014 0644     Studies/Results: No results found.  Medications: . enoxaparin (LOVENOX) injection  100 mg Subcutaneous Q24H  . feeding supplement (PRO-STAT SUGAR FREE 64)  60 mL Oral BID  . feeding supplement (RESOURCE BREEZE)  1 Container Oral TID BM  . insulin aspart  0-9 Units Subcutaneous 6 times per  day  . pantoprazole (PROTONIX) IV  40 mg Intravenous Q12H   Prior to Admission medications   Medication Sig Start Date End Date Taking? Authorizing Provider  atorvastatin (LIPITOR) 80 MG tablet Take 80 mg by mouth daily.   Yes Historical Provider, MD  bismuth subsalicylate (PEPTO BISMOL) 262 MG/15ML suspension Place 30 mLs into feeding tube every 4 (four) hours as needed for indigestion. 01/11/15  Yes Venetia Maxon Rama, MD  budesonide-formoterol (SYMBICORT) 160-4.5 MCG/ACT inhaler Take 2 puffs first thing in am and then another 2 puffs about 12 hours later. 01/11/15  Yes Venetia Maxon Rama, MD  calcium citrate-vitamin D (CITRACAL+D) 315-200 MG-UNIT per tablet Take 1 tablet by mouth daily.   Yes Historical Provider, MD  Cinnamon 500 MG capsule Take 500 mg by mouth daily.   Yes Historical Provider, MD  Dapagliflozin Propanediol (FARXIGA) 5 MG TABS Take 5 mg by mouth 2 (two) times daily.   Yes Historical Provider, MD  enoxaparin (LOVENOX) 100 MG/ML injection Inject 1 mL (100 mg total) into the skin daily. 01/11/15  Yes Christina P Rama, MD  fentaNYL (DURAGESIC - DOSED MCG/HR) 12 MCG/HR Place 12.5 mcg onto the skin every 3 (three) days.   Yes Historical Provider, MD  glimepiride (AMARYL) 4 MG tablet Take 4 mg by mouth 2 (two) times daily. Take before meals   Yes Historical Provider, MD  LORazepam (  ATIVAN) 0.5 MG tablet Take 1 table by mouth every 12 hours as needed for anxiety 12/19/14  Yes Nishant Dhungel, MD  metFORMIN (GLUCOPHAGE) 500 MG tablet Take 500 mg by mouth 2 (two) times daily with a meal.   Yes Historical Provider, MD  Multiple Vitamins-Iron (MULTIVITAMINS WITH IRON) TABS tablet Take 1 tablet by mouth daily after supper. 12/19/14  Yes Nishant Dhungel, MD  Nutritional Supplements (PROMOTE PO) Give 70 mL/hr by tube at bedtime. Runs from  10 pm to 8 am   Yes Historical Provider, MD  oxyCODONE (OXY IR/ROXICODONE) 5 MG immediate release tablet Take 2 tablets (10 mg total) by mouth every 4 (four) hours as  needed for moderate pain. 12/19/14  Yes Nishant Dhungel, MD  pantoprazole (PROTONIX) 40 MG tablet Take 1 tablet (40 mg total) by mouth 2 (two) times daily. 12/19/14  Yes Nishant Dhungel, MD  potassium chloride SA (K-DUR,KLOR-CON) 20 MEQ tablet Take 2 tablets (40 mEq total) by mouth daily. 12/19/14  Yes Nishant Dhungel, MD  prochlorperazine (COMPAZINE) 10 MG tablet Take 1 tablet (10 mg total) by mouth every 6 (six) hours as needed for nausea or vomiting. 12/19/14  Yes Nishant Dhungel, MD  saccharomyces boulardii (FLORASTOR) 250 MG capsule Take 1 capsule (250 mg total) by mouth 2 (two) times daily. 12/19/14  Yes Nishant Dhungel, MD  temazepam (RESTORIL) 15 MG capsule Take 1 capsule (15 mg total) by mouth at bedtime as needed for sleep. 11/01/14  Yes Curt Bears, MD  DULoxetine (CYMBALTA) 20 MG capsule Take 1 capsule (20 mg total) by mouth daily. 01/11/15   Venetia Maxon Rama, MD  feeding supplement (ENSURE CLINICAL STRENGTH) LIQD Take 237 mLs by mouth 3 (three) times daily with meals.    Historical Provider, MD  hydrocortisone-pramoxine [facility]) 2.5-1 % rectal cream Place 1 application rectally every 6 (six) hours as needed for hemorrhoids or itching. Patient not taking: Reported on 01/24/2015 12/19/14   Nishant Dhungel, MD  potassium chloride SA (K-DUR,KLOR-CON) 20 MEQ tablet Take 1 tablet (20 mEq total) by mouth daily. Patient not taking: Reported on 11/21/2014 10/28/14   Garald Balding, NP     Assessment/Plan 1. Recurrent nausea and vomiting 2. Bleeding duodenal ulcer with cholecystoduodenal fistula --EGD for control of bleeding - 11/30/14 - [name] --Ex Lap, oversewing of posterior bleeding DU, Heineke Mikulicz pyloroplasty, cholecystectomy, repair of right hepatic artery, repair of cholecystoduodenal fistula and placement of duodenostomy tube, feeding jejunostomy - 11/30/14 - Dr. Jackolyn Confer 3. Left pulmonary embolism - on Lovenox 4. COPD and Non small cell lung carcinoma, Stage IIIB  (T3, N3, M0) undergoing chemotherapy and radiation therapy. (07/2014) 5. 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 Lovenox/SCD for DVT prophylaxis  7.  Chronic pain hands and feet - he has been taking just percocet but just went to pain clinic on [address] and was ordered Morphine. He never got it because [facility] didn't have it.  He has been on low dose fentanyl also and that has not been replaced since he got here.  The pain he is having now is not related to his stomach.  He has been off all the pain meds except IV MS. I talked with Paralee Cancel PA-C at [facility] and she thinks the Fentanyl could be a source. He has had no nausea since he was admitted.  She has placed him on MS IR 15 mg q 6h. So I will transition him to that today also.   Plan:  Clamp duodenostomy  tube, full liquids, ambulate and decrease IV fluids.  Ask nutrition to get him some PO's that will not run sugars up so badly. I/m restarting some of his diabetic medicines. Of note he is still taking large doses of morphine 20 mg yesterday and 12 mg so far today. I am trying to get info from the Pain clinic, but they are not sure who they saw and it's not in the [facility] chart, as far as I can see.   Change PPI to PO.    LOS: 5 days    Daryl Vega,Daryl Vega 01/29/2015  Vomiting again. So we may be going backwards. His wife is in the room. His abdomen is soft and he has no tenderness. He says that he is on chronic pain meds for arms, hands, and feet - but there  Is not a specific diagnosis.  Alphonsa Overall, MD, [facility] Surgery Pager: [phone] Office phone:  [phone]

## 2015-01-30 ENCOUNTER — Telehealth: Payer: Self-pay | Admitting: *Deleted

## 2015-01-30 LAB — COMPREHENSIVE METABOLIC PANEL
ALT: 317 U/L — AB (ref 0–53)
AST: 227 U/L — ABNORMAL HIGH (ref 0–37)
Albumin: 3.1 g/dL — ABNORMAL LOW (ref 3.5–5.2)
Alkaline Phosphatase: 382 U/L — ABNORMAL HIGH (ref 39–117)
Anion gap: 10 (ref 5–15)
BUN: 21 mg/dL (ref 6–23)
CALCIUM: 9.1 mg/dL (ref 8.4–10.5)
CO2: 24 mmol/L (ref 19–32)
CREATININE: 0.53 mg/dL (ref 0.50–1.35)
Chloride: 102 mmol/L (ref 96–112)
GLUCOSE: 241 mg/dL — AB (ref 70–99)
Potassium: 3.8 mmol/L (ref 3.5–5.1)
Sodium: 136 mmol/L (ref 135–145)
Total Bilirubin: 1.2 mg/dL (ref 0.3–1.2)
Total Protein: 6.6 g/dL (ref 6.0–8.3)

## 2015-01-30 LAB — GLUCOSE, CAPILLARY
GLUCOSE-CAPILLARY: 215 mg/dL — AB (ref 70–99)
GLUCOSE-CAPILLARY: 220 mg/dL — AB (ref 70–99)
Glucose-Capillary: 246 mg/dL — ABNORMAL HIGH (ref 70–99)
Glucose-Capillary: 127 mg/dL — ABNORMAL HIGH (ref 70–99)
Glucose-Capillary: 75 mg/dL (ref 70–99)
Glucose-Capillary: 108 mg/dL — ABNORMAL HIGH (ref 70–99)

## 2015-01-30 LAB — CBC
HCT: 32.2 % — ABNORMAL LOW (ref 39.0–52.0)
Hemoglobin: 10.4 g/dL — ABNORMAL LOW (ref 13.0–17.0)
MCH: 28.5 pg (ref 26.0–34.0)
MCHC: 32.3 g/dL (ref 30.0–36.0)
MCV: 88.2 fL (ref 78.0–100.0)
Platelets: 178 10*3/uL (ref 150–400)
RBC: 3.65 MIL/uL — AB (ref 4.22–5.81)
RDW: 16.8 % — AB (ref 11.5–15.5)
WBC: 8.3 10*3/uL (ref 4.0–10.5)

## 2015-01-30 LAB — PREALBUMIN: PREALBUMIN: 22.2 mg/dL (ref 17.0–34.0)

## 2015-01-30 NOTE — Progress Notes (Signed)
[facility] Progress Note     Subjective: Pt c/o constant pain in hands/feet.  He says he has had intermittent abdominal pain since surgery but is unrelated to his surgical scar.  He notes N/V yesterday, none this morning since d-tube has been to gravity bag.  Wife at bedside.     Objective: Vital signs in last 24 hours: Temp:  [97.8 F (36.6 C)-99.4 F (37.4 C)] 97.8 F (36.6 C) (02/16 0600) Pulse Rate:  [52-101] 52 (02/16 0600) Resp:  [16] 16 (02/16 0600) BP: (106-115)/(60-69) 114/69 mmHg (02/16 0600) SpO2:  [90 %-97 %] 93 % (02/16 0946) Last BM Date: 01/28/15  Intake/Output from previous day: 02/15 0701 - 02/16 0700 In: 3918.8 [P.O.:685; I.V.:1675; NG/GT:1558.8] Out: 7017 [Urine:1750; Emesis/NG output:150; Drains:575] Intake/Output this shift: Total I/O In: -  Out: 576 [Urine:500; Drains:75; Stool:1]  PE: Gen:  Alert, NAD, pleasant Abd: Soft, ND, NT to palpation, +BS, no HSM, incisions sites well healed, J tube in LUQ, d-tube in RUQ.  We removed 1.5cc from the d-tube balloon cuff.   Lab Results:   Recent Labs  01/28/15 0546 01/30/15 0454  WBC 4.7 8.3  HGB 10.1* 10.4*  HCT 32.2* 32.2*  PLT 196 178   BMET  Recent Labs  01/29/15 0655 01/30/15 0454  NA  --  136  K  --  3.8  CL  --  102  CO2  --  24  GLUCOSE  --  241*  BUN  --  21  CREATININE 0.55 0.53  CALCIUM  --  9.1   PT/INR No results for input(s): LABPROT, INR in the last 72 hours. CMP     Component Value Date/Time   NA 136 01/30/2015 0454   NA 139 11/21/2014 1440   K 3.8 01/30/2015 0454   K 3.8 11/21/2014 1440   CL 102 01/30/2015 0454   CO2 24 01/30/2015 0454   CO2 22 11/21/2014 1440   GLUCOSE 241* 01/30/2015 0454   GLUCOSE 150* 11/21/2014 1440   BUN 21 01/30/2015 0454   BUN 14.5 11/21/2014 1440   CREATININE 0.53 01/30/2015 0454   CREATININE 0.9 11/21/2014 1440   CALCIUM 9.1 01/30/2015 0454   CALCIUM 9.8 11/21/2014 1440   PROT 6.6 01/30/2015 0454   PROT 6.8 11/21/2014  1440   ALBUMIN 3.1* 01/30/2015 0454   ALBUMIN 3.6 11/21/2014 1440   AST 227* 01/30/2015 0454   AST 15 11/21/2014 1440   ALT 317* 01/30/2015 0454   ALT 18 11/21/2014 1440   ALKPHOS 382* 01/30/2015 0454   ALKPHOS 95 11/21/2014 1440   BILITOT 1.2 01/30/2015 0454   BILITOT 0.83 11/21/2014 1440   GFRNONAA >90 01/30/2015 0454   GFRAA >90 01/30/2015 0454   Lipase     Component Value Date/Time   LIPASE 41 11/29/2014 0644       Studies/Results: No results found.  Anti-infectives: Anti-infectives    None       Assessment/Plan 1.Recurrent nausea and vomiting 2. Bleeding duodenal ulcer with cholecystoduodenal fistula --EGD for control of bleeding - 11/30/14 - [name] --Ex Lap, oversewing of posterior bleeding DU, Heineke Mikulicz pyloroplasty, cholecystectomy, repair of right hepatic artery, repair of cholecystoduodenal fistula and placement of duodenostomy tube, feeding jejunostomy - 11/30/14 - Dr. Jackolyn Confer 3. Left pulmonary embolism - on Lovenox 4. 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 (07/2014) 5. 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 Lovenox/SCD for DVT prophylaxis 7. Acute  elevation in AST/ALT and Alk Phos to the 200-300's 8.  Chronic pain hands and feet - he has been taking just percocet but just went to pain clinic on [address] and was ordered Morphine. He never got it because [facility] didn't have it. He has been on low dose fentanyl also and that has not been replaced since he got here. The pain he is having now is not related to his incision/surgery. He has been off all the pain meds except IV MS.  Paralee Cancel PA-C at [facility] and she thinks the Fentanyl could be a source so this was stopped. We will continue her recommended medication MS IR 15 mg q 6h.   Plan: Removed 1.5cc liquid off d-tube balloon port (foley) in hopes to reduce some of his N/V symptoms.  His UGI  does not show obstruction at this site.  Will see if this improves his nausea/vomiting.  Clamp duodenostomy tube again, fulls as tolerated, ambulate.  Will ask Dr. Julien Nordmann to come by and comment on his current situation and see if we need any different medical management of his N/V and also to get his recommendations about recent jump in LFT's today to the 200-300's.  I do not see where he has mets to his liver, but of course that is a concern.  WBC and temps are normal.  Last CT was 3 mo ago, but would not get one for concern for acute intra-abdominal problems unless Dr. Julien Nordmann is concerned for staging purposes.      LOS: 6 days    Coralie Keens 01/30/2015, 10:44 AM Pager: [phone]  Agree with above. I spoke with Dr. Julien Nordmann.   He recommended no further workup at this time for metastatic lung cancer. His pain is an issue that was addressed by the pain clinic on [address], but this admission interrupted the plans. Wife in the room.  Alphonsa Overall, MD, [facility] Surgery Pager: [phone] Office phone:  [phone]

## 2015-01-30 NOTE — Telephone Encounter (Signed)
PT. IS HAVING PROBLEMS WITH NAUSEA AND VOMITING WHICH IS NOT SURGICALLY RELATED. HIS LIVER ENZYMES ARE NOW ELEVATED AT 200-300. IF DR.MOHAMED HAS ANY QUESTIONS CALL [phone]. FYI PT. ALSO HAS A STABLE PE.

## 2015-01-30 NOTE — Progress Notes (Signed)
PT Cancellation Note  Patient Details Name: Daryl Vega MRN: [id number] DOB: 06-06-1946   Cancelled Treatment:      Pt has been amb with nursing staff several times without complications.  Currently in bed and requested to rest.  Nathanial Rancher 01/30/2015, 3:33 PM

## 2015-01-30 NOTE — Evaluation (Signed)
Occupational Therapy Evaluation Patient Details Name: Daryl Vega MRN: [id number] DOB: 11/28/1946 Today's Date: 01/30/2015    History of Present Illness 69 yo male with history of  lung cancer, admitted for nausea/vomiting.  This is 4th admission within 6 months.   Recently had fall, N/V, on tube feedings, and PE on previous admission.  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      Follow Up Recommendations  Supervision/Assistance - 24 hour    Equipment Recommendations  None recommended by OT (likely)    Recommendations for Other Services       Precautions / Restrictions Precautions Precautions: Fall Precaution Comments: 2 tubes--abdomen Restrictions Weight Bearing Restrictions: No      Mobility Bed Mobility       Sidelying to sit: Min assist       General bed mobility comments: assist for trunk  Transfers   Equipment used: Rolling walker (2 wheeled) Transfers: Sit to/from Stand Sit to Stand: Supervision              Balance                                            ADL Overall ADL's : Needs assistance/impaired     Grooming: Wash/dry hands;Min guard;Standing       Lower Body Bathing: Minimal assistance;Sit to/from stand       Lower Body Dressing: Moderate assistance;Sit to/from stand   Toilet Transfer: Minimal assistance;Ambulation;Comfort height toilet             General ADL Comments: Pt can perform UB adls with set up.  He feels that he will be OK getting on/off commode.  He states he has been hospitalized 4 times recently.  Pt has pain in bil hands--has hurt since chemo and they are still figuring it out.  Describes this as throbbing.  Wife can assist as needed at home.  Pt sponge bathes due to tubes.  Asked to ambulate in the hall.  I started with him, and student RN took over..  Pt had balance check  when negotiating around obstacle on way to bathroom; min guard for safety when ambulating:  was steady in hallway     Vision     Perception     Praxis      Pertinent Vitals/Pain Pain Score: 8  Pain Location: hands Pain Descriptors / Indicators: Aching Pain Intervention(s): Limited activity within patient's tolerance;Monitored during session;Premedicated before session (asked for pain medication, but it wasn't time)     Hand Dominance     Extremity/Trunk Assessment Upper Extremity Assessment Upper Extremity Assessment: Generalized weakness (reports soreness in bil shoulders)           Communication Communication Communication: HOH   Cognition Arousal/Alertness: Awake/alert Behavior During Therapy: WFL for tasks assessed/performed Overall Cognitive Status: Within Functional Limits for tasks assessed                     General Comments       Exercises       Shoulder Instructions      Home Living Family/patient expects to be discharged to:: Private residence Living Arrangements: Spouse/significant other Available Help at Discharge: Family  Bathroom Shower/Tub:  (sponge bathes due to tubes)   Bathroom Toilet: Standard     Home Equipment: Cane - single point;Wheelchair - [employer] - 2 wheels          Prior Functioning/Environment Level of Independence: Needs assistance  Gait / Transfers Assistance Needed: walks w/ RW ADL's / Homemaking Assistance Needed: mod I        OT Diagnosis: Generalized weakness   OT Problem List: Decreased strength;Decreased activity tolerance;Impaired balance (sitting and/or standing);Pain   OT Treatment/Interventions: Self-care/ADL training;DME and/or AE instruction;Patient/family education;Balance training    OT Goals(Current goals can be found in the care plan section) Acute Rehab OT Goals Patient Stated Goal: home OT Goal Formulation: With patient Time For Goal Achievement:  02/06/15 Potential to Achieve Goals: Good ADL Goals Pt Will Perform Lower Body Bathing: with supervision;sit to/from stand Pt Will Transfer to Toilet: with supervision;regular height toilet;ambulating Additional ADL Goal #1: pt will perform bed mobility at supervision level from flat bed in preparation for adls  OT Frequency: Min 2X/week   Barriers to D/C:            Co-evaluation              End of Session    Activity Tolerance: Patient tolerated treatment well Patient left:  (with student RN, walking)   Time: 5397-6734 OT Time Calculation (min): 20 min Charges:  OT General Charges $OT Visit: 1 Procedure OT Evaluation $Initial OT Evaluation Tier I: 1 Procedure G-Codes:    Alithia Zavaleta 02/12/15, 1:53 PM  Lesle Chris, OTR/L [phone] 02-12-2015

## 2015-01-31 LAB — GLUCOSE, CAPILLARY
GLUCOSE-CAPILLARY: 155 mg/dL — AB (ref 70–99)
GLUCOSE-CAPILLARY: 135 mg/dL — AB (ref 70–99)
Glucose-Capillary: 131 mg/dL — ABNORMAL HIGH (ref 70–99)
Glucose-Capillary: 135 mg/dL — ABNORMAL HIGH (ref 70–99)
Glucose-Capillary: 201 mg/dL — ABNORMAL HIGH (ref 70–99)
Glucose-Capillary: 73 mg/dL (ref 70–99)

## 2015-01-31 MED ORDER — HYDROCORTISONE ACE-PRAMOXINE 2.5-1 % RE CREA: 1.0000 "application " | TOPICAL_CREAM | Freq: Four times a day (QID) | RECTAL | Status: DC | PRN

## 2015-01-31 MED ORDER — PRO-STAT SUGAR FREE PO LIQD: 60.0000 mL | Freq: Two times a day (BID) | ORAL | Status: DC

## 2015-01-31 MED ORDER — MORPHINE SULFATE 15 MG PO TABS: 15.0000 mg | ORAL_TABLET | Freq: Four times a day (QID) | ORAL | Status: DC | PRN

## 2015-01-31 MED ORDER — OSMOLITE 1.5 CAL PO LIQD: 1000.0000 mL | ORAL | Status: DC

## 2015-01-31 MED ORDER — CALCIUM CITRATE-VITAMIN D 315-200 MG-UNIT PO TABS: 1.0000 | ORAL_TABLET | Freq: Every day | ORAL | Status: DC

## 2015-01-31 MED ORDER — TAB-A-VITE/IRON PO TABS
1.0000 | ORAL_TABLET | Freq: Every day | ORAL | Status: DC
  Administered 2015-01-31: 1 via ORAL
  Filled 2015-01-31 (×4): qty 1

## 2015-01-31 MED ORDER — GLUCERNA SHAKE PO LIQD: 237.0000 mL | Freq: Two times a day (BID) | ORAL | Status: DC

## 2015-01-31 MED ORDER — BOOST / RESOURCE BREEZE PO LIQD: 1.0000 | Freq: Two times a day (BID) | ORAL | Status: DC

## 2015-01-31 MED ORDER — ATORVASTATIN CALCIUM 80 MG PO TABS
80.0000 mg | ORAL_TABLET | Freq: Every day | ORAL | Status: DC
  Administered 2015-01-31 – 2015-02-01 (×2): 80 mg via ORAL
  Filled 2015-01-31 (×2): qty 1

## 2015-01-31 MED ORDER — CALCIUM CITRATE 950 (200 CA) MG PO TABS
200.0000 mg | ORAL_TABLET | Freq: Every day | ORAL | Status: DC
  Administered 2015-01-31 – 2015-02-01 (×2): 200 mg via ORAL
  Filled 2015-01-31 (×2): qty 1

## 2015-01-31 NOTE — Progress Notes (Signed)
Doing OK after lunch with soft diet.  He wants to go home in AM.  Will set things up so home health will be ready to follow up tomorrow if he does well with PO meds and back on home regiment of medicines and PO intake.    Will Durenda Age  Alphonsa Overall, MD, [facility] Surgery Pager: [phone] Office phone:  [phone]

## 2015-01-31 NOTE — Discharge Summary (Signed)
[facility] Discharge Summary   Patient ID: Daryl Vega MRN: [id number] DOB/AGE: 1945/12/30 69 y.o.  Admit date: 01/24/2015 Discharge date: 02/01/2015   Admitting Diagnosis: 1.  Nausea and vomiting  2.  Failure to thrive 3.  S/P Bleeding duodenal ulcer with cholecystoduodenal fistula 4.  Left pulmonary embolism - On Lovenox 5. 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 (07/2014) 6. 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  Nausea and vomiting  2.  Failure to thrive 3.  S/P Bleeding duodenal ulcer with cholecystoduodenal fistula 4.  Left pulmonary embolism  - On Lovenox 5. Non small cell lung carcinoma, Stage IIIB (T3, N3, M0) undergoing chemotherapy and radiation therapy. (07/2014) 6.  COPD 7. 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 No contrast leak is observed. 2. Foley catheter duodenostomy tube in place with distended delayed in the duodenal lumen. 3. There is some slight irregularity of the duodenal bulb, and today's exam is not able to rule out ulceration. 4. The patient has limited mobility and limited ability to drink the contrast medium -today's water-soluble exam was focused on assessing for leak in the distal stomach or duodenum and has reduced sensitivity in assessing the stomach and esophagus compared to a standard upper GI exam.   Procedures None   Hospital Course:  69 y/o white male seen in our office by [name] and complained of nausea and vomiting.  He was admitted for further evaluation and to resolve issues with PO intake.  On Admit her underwent acute abdominal  films that showed a nonobstructive bowel gas pattern.  UGI showed the contrast went around the balloon, but balloon took up some of the lumen.    He continued to have nausea so we put  his duodenostomy tube to straight drain.  He did a little better, but continued to have episodes of nausea.  We resumed his tube feedings and this did not seem to cause him any issues.  We noted he has taking a large amounts of Morphine, and this was not consistent with his surgical wounds.  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  However, they were not able to pick up the PO Morphine IR at [facility] because they did not have it.  He subsequently came to the hospital and thus hadn't started it.  We stopped the Fentanyl patch at the PA's Paralee Cancel - pain mgt) suggestion because this has been known to cause issues with nausea.  We switched him to PO Morphine since pain management suggested he start it.    On HD #6 we removed 1.14mL from his duodenostomy tube inflation cuff.  We hoped in doing this he would have reduction in N/V.  Luckily this was the case and we were able to slowly advanced his diet.  He is tolerating a soft diet.  We have continued the evening cyclic tube feedings 07PX/TG 8pm - 8am and he will continue this at home.  He requires additional supplements which include prostat, breeze, and glucerna.  Home health has been assisting with this at home, prior to admit and this will also continue.  On HD #6 he had acute transaminitis, we consulted Dr. Julien Nordmann who did not recommend any  further interventions such as repeating labs or imaging studies.  Follow up with [name] in 2-3 weeks for a recheck.  He should also follow up with Dr. Julien Nordmann for his regular scheduled visits.    CBC Latest Ref Rng 01/30/2015 01/28/2015 01/25/2015  WBC 4.0 - 10.5 K/uL 8.3 4.7 9.4  Hemoglobin 13.0 - 17.0 g/dL 10.4(L) 10.1(L) 9.7(L)  Hematocrit 39.0 - 52.0 % 32.2(L) 32.2(L) 30.9(L)  Platelets 150 - 400 K/uL 178 196 218   CMP Latest Ref Rng 01/30/2015 01/29/2015 01/25/2015  Glucose 70 - 99 mg/dL 241(H) - 184(H)  BUN 6 - 23 mg/dL 21  - 15  Creatinine 0.50 - 1.35 mg/dL 0.53 0.55 0.69  Sodium 135 - 145 mmol/L 136 - 137  Potassium 3.5 - 5.1 mmol/L 3.8 - 3.6  Chloride 96 - 112 mmol/L 102 - 101  CO2 19 - 32 mmol/L 24 - 28  Calcium 8.4 - 10.5 mg/dL 9.1 - 8.7  Total Protein 6.0 - 8.3 g/dL 6.6 - -  Total Bilirubin 0.3 - 1.2 mg/dL 1.2 - -  Alkaline Phos 39 - 117 U/L 382(H) - -  AST 0 - 37 U/L 227(H) - -  ALT 0 - 53 U/L 317(H) - -   As noted labs show a rise in LFT's, this was discussed with oncology and we do not currently plan to follow this up.  Dr. Julien Nordmann is aware.   Physical Exam: General:  Alert, NAD, pleasant, comfortable Abd: Soft, ND, NT to palpation, +BS, no HSM, incisions sites well healed, J tube in LUQ, d-tube in RUQ.      Medication List    STOP taking these medications        fentaNYL 12 MCG/HR  Commonly known as:  DURAGESIC - dosed mcg/hr     hydrocortisone-pramoxine 2.5-1 % rectal cream  Commonly known as:  ANALPRAM-HC      TAKE these medications        atorvastatin 80 MG tablet  Commonly known as:  LIPITOR  Take 80 mg by mouth daily.     bismuth subsalicylate 924 QA/83MH suspension  Commonly known as:  PEPTO BISMOL  Place 30 mLs into feeding tube every 4 (four) hours as needed for indigestion.     budesonide-formoterol 160-4.5 MCG/ACT inhaler  Commonly known as:  SYMBICORT  Take 2 puffs first thing in am and then another 2 puffs about 12 hours later.     calcium citrate-vitamin D 315-200 MG-UNIT per tablet  Commonly known as:  CITRACAL+D  Take 1 tablet by mouth daily.     Cinnamon 500 MG capsule  Take 500 mg by mouth daily.     DULoxetine 20 MG capsule  Commonly known as:  CYMBALTA  Take 1 capsule (20 mg total) by mouth daily.     enoxaparin 100 MG/ML injection  Commonly known as:  LOVENOX  Inject 1 mL (100 mg total) into the skin daily.     FARXIGA 5 MG Tabs tablet  Generic drug:  dapagliflozin propanediol  Take 5 mg by mouth 2 (two) times daily.     feeding supplement  (PRO-STAT SUGAR FREE 64) Liqd  Place 60 mLs into feeding tube 2 (two) times daily.     feeding supplement Liqd  Take 237 mLs by mouth 3 (three) times daily with meals.     PROMOTE PO  Give 70 mL/hr by tube at bedtime. Runs from  10 pm to 8 am     feeding supplement (OSMOLITE 1.5 CAL) Liqd  Place 1,000 mLs into feeding tube continuous. Continue Osmolite 1.5 @ 75 ml/hr over 12 hours (8pm-8am)     feeding supplement (RESOURCE BREEZE) Liqd  Take 1 Container by mouth 2 (two) times daily between meals.     feeding supplement (GLUCERNA SHAKE) Liqd  Take 237 mLs by mouth 2 (two) times daily between meals.     glimepiride 4 MG tablet  Commonly known as:  AMARYL  Take 4 mg by mouth 2 (two) times daily. Take before meals     LORazepam 0.5 MG tablet  Commonly known as:  ATIVAN  Take 1 table by mouth every 12 hours as needed for anxiety     metFORMIN 500 MG tablet  Commonly known as:  GLUCOPHAGE  Take 500 mg by mouth 2 (two) times daily with a meal.     morphine 15 MG tablet  Commonly known as:  MSIR  Take 1 tablet (15 mg total) by mouth every 6 (six) hours as needed for severe pain.     multivitamins with iron Tabs tablet  Take 1 tablet by mouth daily after supper.     oxyCODONE 5 MG immediate release tablet  Commonly known as:  Oxy IR/ROXICODONE  Take 2 tablets (10 mg total) by mouth every 4 (four) hours as needed for moderate pain.     pantoprazole 40 MG tablet  Commonly known as:  PROTONIX  Take 1 tablet (40 mg total) by mouth 2 (two) times daily.     potassium chloride SA 20 MEQ tablet  Commonly known as:  K-DUR,KLOR-CON  Take 2 tablets (40 mEq total) by mouth daily.     prochlorperazine 10 MG tablet  Commonly known as:  COMPAZINE  Take 1 tablet (10 mg total) by mouth every 6 (six) hours as needed for nausea or vomiting.     saccharomyces boulardii 250 MG capsule  Commonly known as:  FLORASTOR  Take 1 capsule (250 mg total) by mouth 2 (two) times daily.     temazepam  15 MG capsule  Commonly known as:  RESTORIL  Take 1 capsule (15 mg total) by mouth at bedtime as needed for sleep.         Follow-up Information    Follow up with ROSENBOWER,TODD J, MD. Schedule an appointment as soon as possible for a visit in 2 weeks.   Specialty:  General Surgery   Why:  For post-hospital follow up   Contact information:   [address] [place] [address] [phone]       Follow up with Eilleen Kempf., MD. Schedule an appointment as soon as possible for a visit in 2 weeks.   Specialty:  Oncology   Why:  For post-hospital follow up   Contact information:   [place] [address] [phone]       Follow up with [name], MD.   Specialty:  Family Medicine   Why:  As needed   Contact information:   [place] [address] [phone]       Signed: Coralie Keens, [facility] Surgery [phone]  02/01/2015, 9:18 AM  Agree with above.  Alphonsa Overall, MD, [facility] Pager: [phone] Office phone:  [phone]

## 2015-01-31 NOTE — Care Management Note (Signed)
    Page 1 of 1   01/31/2015     2:47:19 PM CARE MANAGEMENT NOTE 01/31/2015  Patient:  Daryl Vega, Daryl Vega   Account Number:  [id number]  Date Initiated:  01/25/2015  Documentation initiated by:  Sunday Spillers  Subjective/Objective Assessment:   69 yo male admitted with n/v/inability to tolerate PO. PTA lived at home with spouse. Active with [facility] for PT/OT/Aide     Action/Plan:   Home when stable   Anticipated DC Date:  01/28/2015   Anticipated DC Plan:  [place]  CM consult      [facility] Choice  Resumption Of Svcs/PTA Provider   Choice offered to / List presented to:          [facility] arranged  [place].   Status of service:  Completed, signed off Medicare Important Message given?   (If response is "NO", the following Medicare IM given date fields will be blank) Date Medicare IM given:   Medicare IM given by:   Date Additional Medicare IM given:   Additional Medicare IM given by:    Discharge Disposition:  [place]  Per UR Regulation:  Reviewed for med. necessity/level of care/duration of stay  If discussed at [place] of Stay Meetings, dates discussed:    Comments:

## 2015-01-31 NOTE — Progress Notes (Signed)
[facility] Progress Note     Subjective: Pt says his N/V has resolved and his tube has been clamped since yesterday around lunch time.  He has tolerated full liquids well.  He had a BM yesterday and flatus and he says that has made his abdominal pain improve.     Objective: Vital signs in last 24 hours: Temp:  [98.4 F (36.9 C)-98.7 F (37.1 C)] 98.4 F (36.9 C) (02/17 0538) Pulse Rate:  [94-111] 94 (02/17 0538) Resp:  [16] 16 (02/17 0538) BP: (99-113)/(65-69) 113/69 mmHg (02/17 0538) SpO2:  [93 %-96 %] 96 % (02/17 0538) Weight:  [143 lb 15.4 oz (65.3 kg)] 143 lb 15.4 oz (65.3 kg) (02/17 0700) Last BM Date: 01/28/15  Intake/Output from previous day: 02/16 0701 - 02/17 0700 In: 2846.3 [P.O.:940; I.V.:1200; NG/GT:646.3] Out: 3976 [Urine:3700; Drains:275; Stool:1] Intake/Output this shift:    PE: Gen:  Alert, NAD, pleasant Abd: Soft, ND, NT to palpation, +BS, no HSM, incisions sites well healed, J tube in LUQ, d-tube in RUQ. We removed 1.5cc from the d-tube balloon cuff yesterday.   Lab Results:   Recent Labs  01/30/15 0454  WBC 8.3  HGB 10.4*  HCT 32.2*  PLT 178   BMET  Recent Labs  01/29/15 0655 01/30/15 0454  NA  --  136  K  --  3.8  CL  --  102  CO2  --  24  GLUCOSE  --  241*  BUN  --  21  CREATININE 0.55 0.53  CALCIUM  --  9.1   PT/INR No results for input(s): LABPROT, INR in the last 72 hours. CMP     Component Value Date/Time   NA 136 01/30/2015 0454   NA 139 11/21/2014 1440   K 3.8 01/30/2015 0454   K 3.8 11/21/2014 1440   CL 102 01/30/2015 0454   CO2 24 01/30/2015 0454   CO2 22 11/21/2014 1440   GLUCOSE 241* 01/30/2015 0454   GLUCOSE 150* 11/21/2014 1440   BUN 21 01/30/2015 0454   BUN 14.5 11/21/2014 1440   CREATININE 0.53 01/30/2015 0454   CREATININE 0.9 11/21/2014 1440   CALCIUM 9.1 01/30/2015 0454   CALCIUM 9.8 11/21/2014 1440   PROT 6.6 01/30/2015 0454   PROT 6.8 11/21/2014 1440   ALBUMIN 3.1* 01/30/2015 0454   ALBUMIN 3.6 11/21/2014 1440   AST 227* 01/30/2015 0454   AST 15 11/21/2014 1440   ALT 317* 01/30/2015 0454   ALT 18 11/21/2014 1440   ALKPHOS 382* 01/30/2015 0454   ALKPHOS 95 11/21/2014 1440   BILITOT 1.2 01/30/2015 0454   BILITOT 0.83 11/21/2014 1440   GFRNONAA >90 01/30/2015 0454   GFRAA >90 01/30/2015 0454   Lipase     Component Value Date/Time   LIPASE 41 11/29/2014 0644       Studies/Results: No results found.  Anti-infectives: Anti-infectives    None       Assessment/Plan 1.Recurrent nausea and vomiting 2. Bleeding duodenal ulcer with cholecystoduodenal fistula  --EGD for control of bleeding - 11/30/14 - [name]  --Ex Lap, oversewing of posterior bleeding DU, Heineke Mikulicz pyloroplasty, cholecystectomy, repair of right hepatic artery, repair of cholecystoduodenal fistula and placement of duodenostomy tube, feeding jejunostomy - 11/30/14 - Dr. Jackolyn Confer 3. Left pulmonary embolism - on Lovenox 4. 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 (07/2014)  5. 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 Lovenox/SCD for DVT prophylaxis  7. Acute elevation in AST/ALT and Alk Phos to the 200-300's 8. Chronic pain hands and feet - he has been taking just percocet without relief, but started on oral MS IR by the pain clinic at [facility].  [facility] was unable to fill, so he hasn't started it yet. He was also started on low dose fentanyl, and that has not been replaced since he got here. The pain he is having now is not related to his incision/surgery. He has been off all the pain meds except IV MS. Paralee Cancel PA-C at [facility] and she thinks the Fentanyl could be a source of his nausea so this was stopped. We will continue recommended MS IR 15 mg q 6h.   Plan: Removed 1.5cc liquid off d-tube balloon port (foley) yesterday with some relief.  He's tolerating fulls now. His UGI does  not show obstruction at this site. Continue to keep D tube clamped, continue TF through J tube at night (cyclical feedings), ambulate.  Dr. Julien Nordmann recommended no further workup since the patients life expectancy is 28mo57yr.  He does not recommend CT or further lab work.  Disp:  Possible d/c today vs tomorrow   LOS: 7 days    DORT, MEGAN 01/31/2015, 8:03 AM Pager: [phone] Agree with above. Looks good. It looks like he will be going home tomorrow.  DAlphonsa Overall MD, F[facility] LL[facility] Pager: [phone] phone:  [phone]

## 2015-01-31 NOTE — Discharge Instructions (Signed)
Fat and Cholesterol Control Diet Fat and cholesterol levels in your blood and organs are influenced by your diet. High levels of fat and cholesterol may lead to diseases of the heart, small and large blood vessels, gallbladder, liver, and pancreas. CONTROLLING FAT AND CHOLESTEROL WITH DIET Although exercise and lifestyle factors are important, your diet is key. That is because certain foods are known to raise cholesterol and others to lower it. The goal is to balance foods for their effect on cholesterol and more importantly, to replace saturated and trans fat with other types of fat, such as monounsaturated fat, polyunsaturated fat, and omega-3 fatty acids. On average, a person should consume no more than 15 to 17 g of saturated fat daily. Saturated and trans fats are considered "bad" fats, and they will raise LDL cholesterol. Saturated fats are primarily found in animal products such as meats, butter, and cream. However, that does not mean you need to give up all your favorite foods. Today, there are good tasting, low-fat, low-cholesterol substitutes for most of the things you like to eat. Choose low-fat or nonfat alternatives. Choose round or loin cuts of red meat. These types of cuts are lowest in fat and cholesterol. Chicken (without the skin), fish, veal, and ground [place] breast are great choices. Eliminate fatty meats, such as hot dogs and salami. Even shellfish have little or no saturated fat. Have a 3 oz (85 g) portion when you eat lean meat, poultry, or fish. Trans fats are also called "partially hydrogenated oils." They are oils that have been scientifically manipulated so that they are solid at room temperature resulting in a longer shelf life and improved taste and texture of foods in which they are added. Trans fats are found in stick margarine, some tub margarines, cookies, crackers, and baked goods.  When baking and cooking, oils are a great substitute for butter. The monounsaturated oils are  especially beneficial since it is believed they lower LDL and raise HDL. The oils you should avoid entirely are saturated tropical oils, such as coconut and palm.  Remember to eat a lot from food groups that are naturally free of saturated and trans fat, including fish, fruit, vegetables, beans, grains (barley, rice, couscous, bulgur wheat), and pasta (without cream sauces).  IDENTIFYING FOODS THAT LOWER FAT AND CHOLESTEROL  Soluble fiber may lower your cholesterol. This type of fiber is found in fruits such as apples, vegetables such as broccoli, potatoes, and carrots, legumes such as beans, peas, and lentils, and grains such as barley. Foods fortified with plant sterols (phytosterol) may also lower cholesterol. You should eat at least 2 g per day of these foods for a cholesterol lowering effect.  Read package labels to identify low-saturated fats, trans fat free, and low-fat foods at the supermarket. Select cheeses that have only 2 to 3 g saturated fat per ounce. Use a heart-healthy tub margarine that is free of trans fats or partially hydrogenated oil. When buying baked goods (cookies, crackers), avoid partially hydrogenated oils. Breads and muffins should be made from whole grains (whole-wheat or whole oat flour, instead of "flour" or "enriched flour"). Buy non-creamy canned soups with reduced salt and no added fats.  FOOD PREPARATION TECHNIQUES  Never deep-fry. If you must fry, either stir-fry, which uses very little fat, or use non-stick cooking sprays. When possible, broil, bake, or roast meats, and steam vegetables. Instead of putting butter or margarine on vegetables, use lemon and herbs, applesauce, and cinnamon (for squash and sweet potatoes). Use nonfat  yogurt, salsa, and low-fat dressings for salads.  LOW-SATURATED FAT / LOW-FAT FOOD SUBSTITUTES Meats / Saturated Fat (g)  Avoid: Steak, marbled (3 oz/85 g) / 11 g  Choose: Steak, lean (3 oz/85 g) / 4 g  Avoid: Hamburger (3 oz/85 g) / 7  g  Choose: Hamburger, lean (3 oz/85 g) / 5 g  Avoid: Ham (3 oz/85 g) / 6 g  Choose: Ham, lean cut (3 oz/85 g) / 2.4 g  Avoid: Chicken, with skin, dark meat (3 oz/85 g) / 4 g  Choose: Chicken, skin removed, dark meat (3 oz/85 g) / 2 g  Avoid: Chicken, with skin, light meat (3 oz/85 g) / 2.5 g  Choose: Chicken, skin removed, light meat (3 oz/85 g) / 1 g Dairy / Saturated Fat (g)  Avoid: Whole milk (1 cup) / 5 g  Choose: Low-fat milk, 2% (1 cup) / 3 g  Choose: Low-fat milk, 1% (1 cup) / 1.5 g  Choose: Skim milk (1 cup) / 0.3 g  Avoid: Hard cheese (1 oz/28 g) / 6 g  Choose: Skim milk cheese (1 oz/28 g) / 2 to 3 g  Avoid: Cottage cheese, 4% fat (1 cup) / 6.5 g  Choose: Low-fat cottage cheese, 1% fat (1 cup) / 1.5 g  Avoid: Ice cream (1 cup) / 9 g  Choose: Sherbet (1 cup) / 2.5 g  Choose: Nonfat frozen yogurt (1 cup) / 0.3 g  Choose: Frozen fruit bar / trace  Avoid: Whipped cream (1 tbs) / 3.5 g  Choose: Nondairy whipped topping (1 tbs) / 1 g Condiments / Saturated Fat (g)  Avoid: Mayonnaise (1 tbs) / 2 g  Choose: Low-fat mayonnaise (1 tbs) / 1 g  Avoid: Butter (1 tbs) / 7 g  Choose: Extra light margarine (1 tbs) / 1 g  Avoid: Coconut oil (1 tbs) / 11.8 g  Choose: Olive oil (1 tbs) / 1.8 g  Choose: Corn oil (1 tbs) / 1.7 g  Choose: Safflower oil (1 tbs) / 1.2 g  Choose: Sunflower oil (1 tbs) / 1.4 g  Choose: Soybean oil (1 tbs) / 2.4 g  Choose: Canola oil (1 tbs) / 1 g Document Released: 12/01/2005 Document Revised: 03/28/2013 Document Reviewed: 03/01/2014 [employer] Patient Information 2015 [place], [place]. This information is not intended to replace advice given to you by your health care provider. Make sure you discuss any questions you have with your health care provider.  Soft-Food Meal Plan A soft-food meal plan includes foods that are safe and easy to swallow. This meal plan typically is used:  If you are having trouble chewing or swallowing  foods.  As a transition meal plan after only having had liquid meals for a long period. WHAT DO I NEED TO KNOW ABOUT THE SOFT-FOOD MEAL PLAN? A soft-food meal plan includes tender foods that are soft and easy to chew and swallow. In most cases, bite-sized pieces of food are easier to swallow. A bite-sized piece is about  inch or smaller. Foods in this plan do not need to be ground or pureed. Foods that are very hard, crunchy, or sticky should be avoided. Also, breads, cereals, yogurts, and desserts with nuts, seeds, or fruits should be avoided. WHAT FOODS CAN I EAT? Grains Rice and wild rice. Moist bread, dressing, pasta, and noodles. Well-moistened dry or cooked cereals, such as farina (cooked wheat cereal), oatmeal, or grits. Biscuits, breads, muffins, pancakes, and waffles that have been well moistened. Vegetables Shredded lettuce. Cooked, tender  vegetables, including potatoes without skins. Vegetable juices. Broths or creamed soups made with vegetables that are not stringy or chewy. Strained tomatoes (without seeds). Fruits Canned or well-cooked fruits. Soft (ripe), peeled fresh fruits, such as peaches, nectarines, kiwi, cantaloupe, honeydew melon, and watermelon (without seeds). Soft berries with small seeds, such as strawberries. Fruit juices (without pulp). Meats and Other Protein Sources Moist, tender, lean beef. Mutton. Lamb. Veal. Chicken. [place]. Liver. Ham. Fish without bones. Eggs. Dairy Milk, milk drinks, and cream. Plain cream cheese and cottage cheese. Plain yogurt. Sweets/Desserts Flavored gelatin desserts. Custard. Plain ice cream, frozen yogurt, sherbet, milk shakes, and malts. Plain cakes and cookies. Plain hard candy.  Other Butter, margarine (without trans fat), and cooking oils. Mayonnaise. Cream sauces. Mild spices, salt, and sugar. Syrup, molasses, honey, and jelly. The items listed above may not be a complete list of recommended foods or beverages. Contact your  dietitian for more options. WHAT FOODS ARE NOT RECOMMENDED? Grains Dry bread, toast, crackers that have not been moistened. Coarse or dry cereals, such as bran, granola, and shredded wheat. Tough or chewy crusty breads, such as [place] bread or baguettes. Vegetables Corn. Raw vegetables except shredded lettuce. Cooked vegetables that are tough or stringy. Tough, crisp, fried potatoes and potato skins. Fruits Fresh fruits with skins or seeds or both, such as apples, pears, or grapes. Stringy, high-pulp fruits, such as papaya, pineapple, coconut, or mango. Fruit leather, fruit roll-ups, and all dried fruits. Meats and Other Protein Sources Sausages and hot dogs. Meats with gristle. Fish with bones. Nuts, seeds, and chunky peanut or other nut butters. Sweets/Desserts Cakes or cookies that are very dry or chewy.  The items listed above may not be a complete list of foods and beverages to avoid. Contact your dietitian for more information. Document Released: 03/09/2008 Document Revised: 12/06/2013 Document Reviewed: 10/28/2013 [facility] Patient Information 2015 [place], [place]. This information is not intended to replace advice given to you by your health care provider. Make sure you discuss any questions you have with your health care provider.

## 2015-02-01 LAB — GLUCOSE, CAPILLARY
GLUCOSE-CAPILLARY: 234 mg/dL — AB (ref 70–99)
Glucose-Capillary: 141 mg/dL — ABNORMAL HIGH (ref 70–99)
Glucose-Capillary: 240 mg/dL — ABNORMAL HIGH (ref 70–99)

## 2015-02-01 NOTE — Progress Notes (Signed)
Discharge instructions given to both pt and wife with all questions answered. HH to follow up.

## 2015-02-07 ENCOUNTER — Inpatient Hospital Stay (HOSPITAL_COMMUNITY)
Admission: EM | Admit: 2015-02-07 | Discharge: 2015-02-09 | DRG: 640 | Disposition: A | Discharge status: Home Health | Payer: Medicare Other | Attending: General Surgery | Admitting: General Surgery

## 2015-02-07 ENCOUNTER — Emergency Department (HOSPITAL_COMMUNITY): Payer: Medicare Other

## 2015-02-07 ENCOUNTER — Encounter (HOSPITAL_COMMUNITY): Payer: Self-pay

## 2015-02-07 DIAGNOSIS — R112 Nausea with vomiting, unspecified: Secondary | ICD-10-CM | POA: Diagnosis not present

## 2015-02-07 DIAGNOSIS — E860 Dehydration: Principal | ICD-10-CM | POA: Diagnosis present

## 2015-02-07 DIAGNOSIS — E119 Type 2 diabetes mellitus without complications: Secondary | ICD-10-CM | POA: Diagnosis present

## 2015-02-07 DIAGNOSIS — Z681 Body mass index (BMI) 19 or less, adult: Secondary | ICD-10-CM

## 2015-02-07 DIAGNOSIS — Z87891 Personal history of nicotine dependence: Secondary | ICD-10-CM

## 2015-02-07 DIAGNOSIS — K267 Chronic duodenal ulcer without hemorrhage or perforation: Secondary | ICD-10-CM | POA: Diagnosis present

## 2015-02-07 DIAGNOSIS — G8929 Other chronic pain: Secondary | ICD-10-CM | POA: Diagnosis present

## 2015-02-07 DIAGNOSIS — Z79899 Other long term (current) drug therapy: Secondary | ICD-10-CM

## 2015-02-07 DIAGNOSIS — J449 Chronic obstructive pulmonary disease, unspecified: Secondary | ICD-10-CM | POA: Diagnosis present

## 2015-02-07 DIAGNOSIS — C3490 Malignant neoplasm of unspecified part of unspecified bronchus or lung: Secondary | ICD-10-CM | POA: Diagnosis present

## 2015-02-07 DIAGNOSIS — E876 Hypokalemia: Secondary | ICD-10-CM | POA: Diagnosis present

## 2015-02-07 DIAGNOSIS — R1110 Vomiting, unspecified: Secondary | ICD-10-CM

## 2015-02-07 DIAGNOSIS — E43 Unspecified severe protein-calorie malnutrition: Secondary | ICD-10-CM | POA: Diagnosis present

## 2015-02-07 DIAGNOSIS — E785 Hyperlipidemia, unspecified: Secondary | ICD-10-CM | POA: Diagnosis present

## 2015-02-07 DIAGNOSIS — K3189 Other diseases of stomach and duodenum: Secondary | ICD-10-CM

## 2015-02-07 DIAGNOSIS — K3184 Gastroparesis: Secondary | ICD-10-CM | POA: Diagnosis present

## 2015-02-07 DIAGNOSIS — Z86711 Personal history of pulmonary embolism: Secondary | ICD-10-CM

## 2015-02-07 HISTORY — DX: Gastroparesis: K31.84

## 2015-02-07 LAB — CBC WITH DIFFERENTIAL/PLATELET
BASOS ABS: 0 10*3/uL (ref 0.0–0.1)
Basophils Relative: 0 % (ref 0–1)
EOS PCT: 2 % (ref 0–5)
Eosinophils Absolute: 0.2 10*3/uL (ref 0.0–0.7)
HEMATOCRIT: 39.1 % (ref 39.0–52.0)
Hemoglobin: 12.8 g/dL — ABNORMAL LOW (ref 13.0–17.0)
LYMPHS PCT: 20 % (ref 12–46)
Lymphs Abs: 2 10*3/uL (ref 0.7–4.0)
MCH: 28.4 pg (ref 26.0–34.0)
MCHC: 32.7 g/dL (ref 30.0–36.0)
MCV: 86.9 fL (ref 78.0–100.0)
MONOS PCT: 10 % (ref 3–12)
Monocytes Absolute: 1 10*3/uL (ref 0.1–1.0)
Neutro Abs: 6.5 10*3/uL (ref 1.7–7.7)
Neutrophils Relative %: 68 % (ref 43–77)
Platelets: 348 10*3/uL (ref 150–400)
RBC: 4.5 MIL/uL (ref 4.22–5.81)
RDW: 16.9 % — AB (ref 11.5–15.5)
WBC: 9.7 10*3/uL (ref 4.0–10.5)

## 2015-02-07 LAB — COMPREHENSIVE METABOLIC PANEL
ALBUMIN: 4 g/dL (ref 3.5–5.2)
ALT: 65 U/L — AB (ref 0–53)
AST: 28 U/L (ref 0–37)
Alkaline Phosphatase: 448 U/L — ABNORMAL HIGH (ref 39–117)
Anion gap: 15 (ref 5–15)
BUN: 27 mg/dL — ABNORMAL HIGH (ref 6–23)
CALCIUM: 9.9 mg/dL (ref 8.4–10.5)
CO2: 32 mmol/L (ref 19–32)
Chloride: 88 mmol/L — ABNORMAL LOW (ref 96–112)
Creatinine, Ser: 0.64 mg/dL (ref 0.50–1.35)
GFR calc Af Amer: >90 mL/min (ref >90)
GFR calc non Af Amer: >90 mL/min (ref >90)
Glucose, Bld: 209 mg/dL — ABNORMAL HIGH (ref 70–99)
Potassium: 3.2 mmol/L — ABNORMAL LOW (ref 3.5–5.1)
SODIUM: 135 mmol/L (ref 135–145)
TOTAL PROTEIN: 8.4 g/dL — AB (ref 6.0–8.3)
Total Bilirubin: 0.6 mg/dL (ref 0.3–1.2)

## 2015-02-07 LAB — I-STAT CG4 LACTIC ACID, ED: Lactic Acid, Venous: 2.35 mmol/L (ref 0.5–2.0)

## 2015-02-07 MED ORDER — SODIUM CHLORIDE 0.9 % IV BOLUS (SEPSIS)
1000.0000 mL | Freq: Once | INTRAVENOUS | Status: AC
  Administered 2015-02-07: 1000 mL via INTRAVENOUS

## 2015-02-07 MED ORDER — ONDANSETRON HCL 4 MG/2ML IJ SOLN
4.0000 mg | Freq: Once | INTRAMUSCULAR | Status: AC
  Administered 2015-02-07: 4 mg via INTRAVENOUS
  Filled 2015-02-07: qty 2

## 2015-02-07 NOTE — ED Provider Notes (Signed)
CSN: [id number]     Arrival date & time 02/07/15  2048 History   First MD Initiated Contact with Patient 02/07/15 2111     Chief Complaint  Patient presents with  . Emesis     (Consider location/radiation/quality/duration/timing/severity/associated sxs/prior Treatment) HPI Comments: Pt with hx of perforated duodenal ulcer status post surgery by Dr. Barkley Bruns who was recently admitted and discharged last week after persistent nausea and vomiting felt to be due to obstruction from the duodenal bulb. At that time 1.5 ML's was removed from the bulb and patient started tolerating by mouth's. 4 days after being home the vomiting started again. He is able to tolerate tube feeds however anytime he tries to eat or drink anything he has projectile vomiting. Patient denies any abdominal pain, fever, change in bowel or bladder habits. Currently not receiving chemotherapy and was taking Compazine for nausea but ran out today  Patient is a 69 y.o. male presenting with vomiting. The history is provided by the patient and the spouse.  Emesis Severity:  Severe Duration:  4 days Timing:  Intermittent Number of daily episodes:  3 episodes of multiple vomiting  Quality:  Stomach contents Progression:  Worsening Chronicity:  Recurrent Recent urination:  Decreased Relieved by:  Nothing Worsened by:  Liquids and ice chips Ineffective treatments: compazine. Associated symptoms: no abdominal pain, no cough, no diarrhea and no fever   Risk factors: prior abdominal surgery     Past Medical History  Diagnosis Date  . DM (diabetes mellitus)   . Pneumonia   . Hypercholesteremia   . Melanoma   . Radiation 08/03/14-08/23/14    35 gray to right chest  . Lung cancer   . FH: chemotherapy    Past Surgical History  Procedure Laterality Date  . Video bronchoscopy Bilateral 07/20/2014    Procedure: VIDEO BRONCHOSCOPY WITHOUT FLUORO;  Surgeon: Tanda Rockers, MD;  Location: [facility];  Service: Cardiopulmonary;   Laterality: Bilateral;  . Esophagogastroduodenoscopy N/A 11/30/2014    Procedure: ESOPHAGOGASTRODUODENOSCOPY (EGD);  Surgeon: [name], MD;  Location: [facility];  Service: Endoscopy;  Laterality: N/A;  . Laparotomy N/A 11/30/2014    Procedure: EXPLORATORY LAPAROTOMY, PYLOROPLASTY, OVERSEWING OF POSTERIOR DUODENUM, DUODENOSTOMY, CHOLECYSTECTOMY, JEJEUNOSTOMY;  Surgeon: Jackolyn Confer, MD;  Location: [facility];  Service: General;  Laterality: N/A;   Family History  Problem Relation Age of Onset  . Heart disease Mother   . Cancer Mother     ? type   History  Substance Use Topics  . Smoking status: Former Smoker -- 2.00 packs/day for 50 years    Types: Cigarettes, Cigars    Quit date: 06/12/2014  . Smokeless tobacco: Current User    Types: Chew  . Alcohol Use: No    Review of Systems  Gastrointestinal: Positive for vomiting. Negative for abdominal pain and diarrhea.  All other systems reviewed and are negative.     Allergies  Review of patient's allergies indicates no known allergies.  Home Medications   Prior to Admission medications   Medication Sig Start Date End Date Taking? Authorizing Provider  Amino Acids-Protein Hydrolys (FEEDING SUPPLEMENT, PRO-STAT SUGAR FREE 64,) LIQD Place 60 mLs into feeding tube 2 (two) times daily. 01/31/15   Megan N Dort, PA-C  atorvastatin (LIPITOR) 80 MG tablet Take 80 mg by mouth daily.    Historical Provider, MD  bismuth subsalicylate (PEPTO BISMOL) 262 MG/15ML suspension Place 30 mLs into feeding tube every 4 (four) hours as needed for indigestion. 01/11/15   Christina P  Rama, MD  budesonide-formoterol (SYMBICORT) 160-4.5 MCG/ACT inhaler Take 2 puffs first thing in am and then another 2 puffs about 12 hours later. 01/11/15   Venetia Maxon Rama, MD  calcium citrate-vitamin D (CITRACAL+D) 315-200 MG-UNIT per tablet Take 1 tablet by mouth daily.    Historical Provider, MD  Cinnamon 500 MG capsule Take 500 mg by mouth daily.    Historical  Provider, MD  Dapagliflozin Propanediol (FARXIGA) 5 MG TABS Take 5 mg by mouth 2 (two) times daily.    Historical Provider, MD  DULoxetine (CYMBALTA) 20 MG capsule Take 1 capsule (20 mg total) by mouth daily. 01/11/15   Christina P Rama, MD  enoxaparin (LOVENOX) 100 MG/ML injection Inject 1 mL (100 mg total) into the skin daily. 01/11/15   Venetia Maxon Rama, MD  feeding supplement (ENSURE CLINICAL STRENGTH) LIQD Take 237 mLs by mouth 3 (three) times daily with meals.    Historical Provider, MD  feeding supplement, GLUCERNA SHAKE, (GLUCERNA SHAKE) LIQD Take 237 mLs by mouth 2 (two) times daily between meals. 01/31/15   Megan N Dort, PA-C  feeding supplement, RESOURCE BREEZE, (RESOURCE BREEZE) LIQD Take 1 Container by mouth 2 (two) times daily between meals. 01/31/15   Megan N Dort, PA-C  glimepiride (AMARYL) 4 MG tablet Take 4 mg by mouth 2 (two) times daily. Take before meals    Historical Provider, MD  LORazepam (ATIVAN) 0.5 MG tablet Take 1 table by mouth every 12 hours as needed for anxiety 12/19/14   Nishant Dhungel, MD  metFORMIN (GLUCOPHAGE) 500 MG tablet Take 500 mg by mouth 2 (two) times daily with a meal.    Historical Provider, MD  morphine (MSIR) 15 MG tablet Take 1 tablet (15 mg total) by mouth every 6 (six) hours as needed for severe pain. 01/31/15   Megan N Dort, PA-C  Multiple Vitamins-Iron (MULTIVITAMINS WITH IRON) TABS tablet Take 1 tablet by mouth daily after supper. 12/19/14   Nishant Dhungel, MD  Nutritional Supplements (FEEDING SUPPLEMENT, OSMOLITE 1.5 CAL,) LIQD Place 1,000 mLs into feeding tube continuous. Continue Osmolite 1.5 @ 75 ml/hr over 12 hours (8pm-8am) 01/31/15   Megan N Dort, PA-C  Nutritional Supplements (PROMOTE PO) Give 70 mL/hr by tube at bedtime. Runs from  10 pm to 8 am    Historical Provider, MD  oxyCODONE (OXY IR/ROXICODONE) 5 MG immediate release tablet Take 2 tablets (10 mg total) by mouth every 4 (four) hours as needed for moderate pain. 12/19/14   Nishant Dhungel, MD   pantoprazole (PROTONIX) 40 MG tablet Take 1 tablet (40 mg total) by mouth 2 (two) times daily. 12/19/14   Nishant Dhungel, MD  potassium chloride SA (K-DUR,KLOR-CON) 20 MEQ tablet Take 2 tablets (40 mEq total) by mouth daily. 12/19/14   Nishant Dhungel, MD  prochlorperazine (COMPAZINE) 10 MG tablet Take 1 tablet (10 mg total) by mouth every 6 (six) hours as needed for nausea or vomiting. 12/19/14   Nishant Dhungel, MD  saccharomyces boulardii (FLORASTOR) 250 MG capsule Take 1 capsule (250 mg total) by mouth 2 (two) times daily. 12/19/14   Nishant Dhungel, MD  temazepam (RESTORIL) 15 MG capsule Take 1 capsule (15 mg total) by mouth at bedtime as needed for sleep. 11/01/14   Curt Bears, MD   BP 97/63 mmHg  Pulse 99  Temp(Src) 97.5 F (36.4 C) (Oral)  Resp 16  SpO2 94% Physical Exam  Constitutional: He is oriented to person, place, and time. He appears well-developed and well-nourished. No distress.  HENT:  Head: Normocephalic and atraumatic.  Mouth/Throat: Oropharynx is clear and moist. Mucous membranes are dry.  Eyes: Conjunctivae and EOM are normal. Pupils are equal, round, and reactive to light.  Neck: Normal range of motion. Neck supple.  Cardiovascular: Normal rate, regular rhythm and intact distal pulses.   No murmur heard. Pulmonary/Chest: Effort normal and breath sounds normal. No respiratory distress. He has no wheezes. He has no rales.  Abdominal: Soft. He exhibits no distension. There is no tenderness. There is no rebound and no guarding.  Healing surgical scars. Drainage tube in the right upper quadrant that's dry and intact without any current drainage. Feeding tube present in the left upper quadrant.  Musculoskeletal: Normal range of motion. He exhibits no edema or tenderness.  Neurological: He is alert and oriented to person, place, and time.  Skin: Skin is warm and dry. No rash noted. No erythema.  Psychiatric: He has a normal mood and affect. His behavior is normal.  Nursing  note and vitals reviewed.   ED Course  Procedures (including critical care time) Labs Review Labs Reviewed  CBC WITH DIFFERENTIAL/PLATELET - Abnormal; Notable for the following:    Hemoglobin 12.8 (*)    RDW 16.9 (*)    All other components within normal limits  COMPREHENSIVE METABOLIC PANEL - Abnormal; Notable for the following:    Potassium 3.2 (*)    Chloride 88 (*)    Glucose, Bld 209 (*)    BUN 27 (*)    Total Protein 8.4 (*)    ALT 65 (*)    Alkaline Phosphatase 448 (*)    All other components within normal limits  I-STAT CG4 LACTIC ACID, ED - Abnormal; Notable for the following:    Lactic Acid, Venous 2.35 (*)    All other components within normal limits    Imaging Review Dg Abd Acute W/chest  02/07/2015   CLINICAL DATA:  Vomiting since Sunday. Feeding tube in the small bowel since December due to stomach ulcers.  EXAM: ACUTE ABDOMEN SERIES (ABDOMEN 2 VIEW & CHEST 1 VIEW)  COMPARISON:  01/25/2015 and 01/24/2015.  FINDINGS: Normal heart size and pulmonary vascularity. Scarring in the right upper lung and right hilum. 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 No significant change since prior study. Hyperinflation of the left lung. No focal airspace disease or consolidation.  Balloon retention catheter in the right upper quadrant and additional catheter in the left mid abdomen. Scattered gas and stool throughout the colon. No small or large bowel distention. Air-fluid level in the stomach without significant distention. No free intra-abdominal air. No radiopaque stones. Visualized bones appear intact. Vascular calcifications.  IMPRESSION: Scarring and volume loss in the right lung consistent with radiation change. No evidence of active pulmonary disease. Left mid abdomen and right upper quadrant catheters. No evidence of bowel obstruction.   Electronically Signed   By: Lucienne Capers M.D.   On: 02/07/2015 22:21     EKG  Interpretation None      MDM   Final diagnoses:  Vomiting    Patient presenting today with persistent nausea and vomiting for the last 4 days. Patient was recently admitted and evaluated by the surgical service for ongoing nausea and vomiting after having a ruptured duodenal ulcer status post surgery currently with a feeding tube. During admission and felt that his nausea and vomiting was related to the duodenal bulb cuff. They inflated at by 1.5 mm with improvement in patient's symptoms and he was  tolerating by mouth's and discharged home. However since being home the symptoms have restarted. Family denies any recent chemotherapy, medication changes or infectious symptoms. They had been taking Compazine for the nausea which they ran out of today but stated it was not helping. Patient is still tolerating his tube feeds. He has no abdominal pain. On exam drain in the right upper quadrant clean dry and intact without any drainage at this time. Feeding tube in the left upper quadrant seems to be in place. Positive bowel sounds with no localized abdominal pain. Mildly tachycardic and afebrile here. Concern for possible obstruction from infusions versus persistent obstruction from duodenal bulb.   CBC, CMP, lactate, acute abdominal series pending. Patient given IV fluids.  11:03 PM Labs only significant for mildly elevated lactate.  AAS without acute obstruction.  Still feel that pt is obstructing from duodenal bulb. Pt given IVF.  Dr. Hassell Done saw the pt and will admit overnight to decide what to do about the duodenal bulb  Blanchie Dessert, MD 02/07/15 2307

## 2015-02-07 NOTE — H&P (Signed)
Chief Complaint:  Projectile vomiting  History of Present Illness:  Daryl Vega is an 69 y.o. male well known to the [facility] MD service who was discharge last Thursday and whose wife brought him in tonight because of vomiting that she felt was severe and she felt uncomfortable.  Duodenal tube is still in place and she is still able to perform tube feedings.  Will defer to [name] about duodenal tube management.  Admitted for rehydration   Past Medical History  Diagnosis Date  . DM (diabetes mellitus)   . Pneumonia   . Hypercholesteremia   . Melanoma   . Radiation 08/03/14-08/23/14    35 gray to right chest  . Lung cancer   . FH: chemotherapy     Past Surgical History  Procedure Laterality Date  . Video bronchoscopy Bilateral 07/20/2014    Procedure: VIDEO BRONCHOSCOPY WITHOUT FLUORO;  Surgeon: Tanda Rockers, MD;  Location: [facility];  Service: Cardiopulmonary;  Laterality: Bilateral;  . Esophagogastroduodenoscopy N/A 11/30/2014    Procedure: ESOPHAGOGASTRODUODENOSCOPY (EGD);  Surgeon: [name], MD;  Location: [facility];  Service: Endoscopy;  Laterality: N/A;  . Laparotomy N/A 11/30/2014    Procedure: EXPLORATORY LAPAROTOMY, PYLOROPLASTY, OVERSEWING OF POSTERIOR DUODENUM, DUODENOSTOMY, CHOLECYSTECTOMY, JEJEUNOSTOMY;  Surgeon: Jackolyn Confer, MD;  Location: [facility];  Service: General;  Laterality: N/A;    No current facility-administered medications for this encounter.   Current Outpatient Prescriptions  Medication Sig Dispense Refill  . atorvastatin (LIPITOR) 80 MG tablet Take 80 mg by mouth daily.    [name] bismuth subsalicylate (PEPTO BISMOL) 262 MG/15ML suspension Place 30 mLs into feeding tube every 4 (four) hours as needed for indigestion. 360 mL 0  . budesonide-formoterol (SYMBICORT) 160-4.5 MCG/ACT inhaler Take 2 puffs first thing in am and then another 2 puffs about 12 hours later. (Patient taking differently: Take 2 puffs first thing in am and then another 2  puffs about 12 hours later as needed for breathing trouble.) 1 Inhaler 3  . calcium citrate-vitamin D (CITRACAL+D) 315-200 MG-UNIT per tablet Take 1 tablet by mouth daily.    . Dapagliflozin Propanediol (FARXIGA) 5 MG TABS Take 5 mg by mouth 2 (two) times daily.    . DULoxetine (CYMBALTA) 20 MG capsule Take 1 capsule (20 mg total) by mouth daily. 30 capsule 3  . enoxaparin (LOVENOX) 100 MG/ML injection Inject 1 mL (100 mg total) into the skin daily. 30 Syringe 2  . glimepiride (AMARYL) 4 MG tablet Take 4 mg by mouth 2 (two) times daily. Take before meals    . LORazepam (ATIVAN) 0.5 MG tablet Take 1 table by mouth every 12 hours as needed for anxiety 30 tablet 0  . metFORMIN (GLUCOPHAGE) 500 MG tablet Take 500 mg by mouth 2 (two) times daily with a meal.    . morphine (MSIR) 15 MG tablet Take 1 tablet (15 mg total) by mouth every 6 (six) hours as needed for severe pain. 40 tablet 0  . Nutritional Supplements (PROMOTE PO) Give 70 mL/hr by tube at bedtime. Runs from  10 pm to 8 am    . pantoprazole (PROTONIX) 40 MG tablet Take 1 tablet (40 mg total) by mouth 2 (two) times daily. 60 tablet 0  . prochlorperazine (COMPAZINE) 10 MG tablet Take 1 tablet (10 mg total) by mouth every 6 (six) hours as needed for nausea or vomiting. 30 tablet 0  . saccharomyces boulardii (FLORASTOR) 250 MG capsule Take 1 capsule (250 mg total) by mouth 2 (two) times  daily. 60 capsule 0  . Amino Acids-Protein Hydrolys (FEEDING SUPPLEMENT, PRO-STAT SUGAR FREE 64,) LIQD Place 60 mLs into feeding tube 2 (two) times daily. (Patient not taking: Reported on 02/07/2015) 900 mL 0  . Cinnamon 500 MG capsule Take 500 mg by mouth daily.    . feeding supplement (ENSURE CLINICAL STRENGTH) LIQD Take 237 mLs by mouth 3 (three) times daily with meals.    . feeding supplement, GLUCERNA SHAKE, (GLUCERNA SHAKE) LIQD Take 237 mLs by mouth 2 (two) times daily between meals. (Patient not taking: Reported on 02/07/2015)  0  . feeding supplement,  RESOURCE BREEZE, (RESOURCE BREEZE) LIQD Take 1 Container by mouth 2 (two) times daily between meals. (Patient not taking: Reported on 02/07/2015)  0  . Multiple Vitamins-Iron (MULTIVITAMINS WITH IRON) TABS tablet Take 1 tablet by mouth daily after supper. (Patient not taking: Reported on 02/07/2015) 30 tablet 0  . Nutritional Supplements (FEEDING SUPPLEMENT, OSMOLITE 1.5 CAL,) LIQD Place 1,000 mLs into feeding tube continuous. Continue Osmolite 1.5 @ 75 ml/hr over 12 hours (8pm-8am)  0  . oxyCODONE (OXY IR/ROXICODONE) 5 MG immediate release tablet Take 2 tablets (10 mg total) by mouth every 4 (four) hours as needed for moderate pain. 40 tablet 0  . potassium chloride SA (K-DUR,KLOR-CON) 20 MEQ tablet Take 2 tablets (40 mEq total) by mouth daily. 30 tablet 0  . temazepam (RESTORIL) 15 MG capsule Take 1 capsule (15 mg total) by mouth at bedtime as needed for sleep. 20 capsule 0   Review of patient's allergies indicates no known allergies. Family History  Problem Relation Age of Onset  . Heart disease Mother   . Cancer Mother     ? type   Social History:   reports that he quit smoking about 7 months ago. His smoking use included Cigarettes and Cigars. He has a 100 pack-year smoking history. His smokeless tobacco use includes Chew. He reports that he does not drink alcohol or use illicit drugs.   REVIEW OF SYStems:    Physical Exam:   Blood pressure 97/63, pulse 99, temperature 97.5 F (36.4 C), temperature source Oral, resp. rate 16, SpO2 94 %. There is no weight on file to calculate BMI.  Gen:  WD  WM NAD  Neurological: Alert and oriented to person, place, and time. Motor and sensory function is grossly intact  Head: Normocephalic and atraumatic.  Eyes: Conjunctivae are normal. Pupils are equal, round, and reactive to light. No scleral icterus.  Neck: Normal range of motion. Neck supple. No tracheal deviation or thyromegaly present.  Cardiovascular:  SR without murmurs or gallops.  No  carotid bruits Breast:  Not examined Respiratory: Effort normal.  No respiratory distress. No chest wall tenderness. Breath sounds normal.  No wheezes, rales or rhonchi.  Abdomen:  Tubes anchored.  Incision healed GU:  Not examined Musculoskeletal: Normal range of motion. Extremities are nontender. No cyanosis, edema or clubbing noted Lymphadenopathy: No cervical, preauricular, postauricular or axillary adenopathy is present Skin: Skin is warm and dry. No rash noted. No diaphoresis. No erythema. No pallor. Pscyh: Normal mood and affect. Behavior is normal. Judgment and thought content normal.   LABORATORY RESULTS: Results for orders placed or performed during the hospital encounter of 02/07/15 (from the past 48 hour(s))  CBC with Differential/Platelet     Status: Abnormal   Collection Time: 02/07/15  9:25 PM  Result Value Ref Range   WBC 9.7 4.0 - 10.5 K/uL   RBC 4.50 4.22 - 5.81 MIL/uL  Hemoglobin 12.8 (L) 13.0 - 17.0 g/dL   HCT 39.1 39.0 - 52.0 %   MCV 86.9 78.0 - 100.0 fL   MCH 28.4 26.0 - 34.0 pg   MCHC 32.7 30.0 - 36.0 g/dL   RDW 16.9 (H) 11.5 - 15.5 %   Platelets 348 150 - 400 K/uL   Neutrophils Relative % 68 43 - 77 %   Neutro Abs 6.5 1.7 - 7.7 K/uL   Lymphocytes Relative 20 12 - 46 %   Lymphs Abs 2.0 0.7 - 4.0 K/uL   Monocytes Relative 10 3 - 12 %   Monocytes Absolute 1.0 0.1 - 1.0 K/uL   Eosinophils Relative 2 0 - 5 %   Eosinophils Absolute 0.2 0.0 - 0.7 K/uL   Basophils Relative 0 0 - 1 %   Basophils Absolute 0.0 0.0 - 0.1 K/uL  Comprehensive metabolic panel     Status: Abnormal   Collection Time: 02/07/15  9:25 PM  Result Value Ref Range   Sodium 135 135 - 145 mmol/L   Potassium 3.2 (L) 3.5 - 5.1 mmol/L   Chloride 88 (L) 96 - 112 mmol/L   CO2 32 19 - 32 mmol/L   Glucose, Bld 209 (H) 70 - 99 mg/dL   BUN 27 (H) 6 - 23 mg/dL   Creatinine, Ser 0.64 0.50 - 1.35 mg/dL   Calcium 9.9 8.4 - 10.5 mg/dL   Total Protein 8.4 (H) 6.0 - 8.3 g/dL   Albumin 4.0 3.5 - 5.2 g/dL    AST 28 0 - 37 U/L   ALT 65 (H) 0 - 53 U/L   Alkaline Phosphatase 448 (H) 39 - 117 U/L   Total Bilirubin 0.6 0.3 - 1.2 mg/dL   GFR calc non Af Amer >90 >90 mL/min   GFR calc Af Amer >90 >90 mL/min    Comment: (NOTE) The eGFR has been calculated using the CKD EPI equation. This calculation has not been validated in all clinical situations. eGFR's persistently <90 mL/min signify possible Chronic Kidney Disease.    Anion gap 15 5 - 15  I-Stat CG4 Lactic Acid, ED     Status: Abnormal   Collection Time: 02/07/15  9:40 PM  Result Value Ref Range   Lactic Acid, Venous 2.35 (HH) 0.5 - 2.0 mmol/L   Comment NOTIFIED PHYSICIAN      RADIOLOGY RESULTS: Dg Abd Acute W/chest  02/07/2015   CLINICAL DATA:  Vomiting since Sunday. Feeding tube in the small bowel since December due to stomach ulcers.  EXAM: ACUTE ABDOMEN SERIES (ABDOMEN 2 VIEW & CHEST 1 VIEW)  COMPARISON:  01/25/2015 and 01/24/2015.  FINDINGS: Normal heart size and pulmonary vascularity. Scarring in the right upper lung and right hilum. 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 No significant change since prior study. Hyperinflation of the left lung. No focal airspace disease or consolidation.  Balloon retention catheter in the right upper quadrant and additional catheter in the left mid abdomen. Scattered gas and stool throughout the colon. No small or large bowel distention. Air-fluid level in the stomach without significant distention. No free intra-abdominal air. No radiopaque stones. Visualized bones appear intact. Vascular calcifications.  IMPRESSION: Scarring and volume loss in the right lung consistent with radiation change. No evidence of active pulmonary disease. Left mid abdomen and right upper quadrant catheters. No evidence of bowel obstruction.   Electronically Signed   By: Lucienne Capers M.D.   On: 02/07/2015 22:21    Problem List:  Patient Active Problem List   Diagnosis Date Noted   . Failure to thrive in adult 01/24/2015  . Pulmonary embolism 01/06/2015  . Enteritis due to Clostridium difficile 12/19/2014  . Hyperglycemia   . Protein-calorie malnutrition, severe 11/30/2014  . 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 11/30/2014  . Bleeding duodenal ulcer s/p ex lap/oversew 11/30/2014 11/30/2014  . Acute respiratory failure with hypoxia   . Anemia 11/29/2014  . Hypokalemia 10/25/2014  . Diarrhea 10/25/2014  . Anemia associated with chemotherapy 10/25/2014  . SOB (shortness of breath) 10/22/2014  . Sinus tachycardia 10/22/2014  . Diabetes mellitus 10/22/2014  . Hyperlipidemia 10/22/2014  . Lung cancer 07/28/2014  . Lung mass/ Sq cell ca 100% obst RUL with Daryl lateral wall and BI 50% obst  07/20/2014  . Cough 07/17/2014  . COPD mixed type 06/28/2014  . Post-obstructive CAP (community acquired pneumonia) 06/27/2014    Assessment & Plan: Mild volume depletion.  Will see if it is time to remove the tube duodenostomy.  Rehydrate tonight    Matt B. Hassell Done, MD, [facility] [phone] beeper [phone]  02/07/2015 11:06 PM

## 2015-02-07 NOTE — ED Notes (Signed)
Pt states has been vomiting since Sunday, states has not been able to keep any food or fluids down, states gets tube feedings at night and has been keeping that down, denies diarrhea, states had BM today/normal, pt denies pain, pt was just recently here in hospital x 8 days for vomiting.

## 2015-02-07 NOTE — ED Notes (Signed)
Patient is a cancer patient.  He has been vomiting x 4 days.  Wife reports that oncologist was notified yesterday and encouraged patient to come to hospital, but he refused until today.

## 2015-02-08 ENCOUNTER — Encounter (HOSPITAL_COMMUNITY): Payer: Self-pay

## 2015-02-08 ENCOUNTER — Observation Stay (HOSPITAL_COMMUNITY): Payer: Medicare Other

## 2015-02-08 DIAGNOSIS — K3184 Gastroparesis: Secondary | ICD-10-CM | POA: Diagnosis present

## 2015-02-08 DIAGNOSIS — E860 Dehydration: Secondary | ICD-10-CM | POA: Diagnosis present

## 2015-02-08 DIAGNOSIS — K267 Chronic duodenal ulcer without hemorrhage or perforation: Secondary | ICD-10-CM | POA: Diagnosis present

## 2015-02-08 DIAGNOSIS — E876 Hypokalemia: Secondary | ICD-10-CM | POA: Diagnosis present

## 2015-02-08 DIAGNOSIS — E119 Type 2 diabetes mellitus without complications: Secondary | ICD-10-CM | POA: Diagnosis present

## 2015-02-08 DIAGNOSIS — R112 Nausea with vomiting, unspecified: Secondary | ICD-10-CM | POA: Diagnosis present

## 2015-02-08 DIAGNOSIS — E43 Unspecified severe protein-calorie malnutrition: Secondary | ICD-10-CM | POA: Diagnosis present

## 2015-02-08 DIAGNOSIS — Z87891 Personal history of nicotine dependence: Secondary | ICD-10-CM | POA: Diagnosis not present

## 2015-02-08 DIAGNOSIS — J449 Chronic obstructive pulmonary disease, unspecified: Secondary | ICD-10-CM | POA: Diagnosis present

## 2015-02-08 DIAGNOSIS — C3490 Malignant neoplasm of unspecified part of unspecified bronchus or lung: Secondary | ICD-10-CM | POA: Diagnosis present

## 2015-02-08 DIAGNOSIS — Z79899 Other long term (current) drug therapy: Secondary | ICD-10-CM | POA: Diagnosis not present

## 2015-02-08 DIAGNOSIS — G8929 Other chronic pain: Secondary | ICD-10-CM | POA: Diagnosis present

## 2015-02-08 DIAGNOSIS — Z86711 Personal history of pulmonary embolism: Secondary | ICD-10-CM | POA: Diagnosis not present

## 2015-02-08 DIAGNOSIS — Z681 Body mass index (BMI) 19 or less, adult: Secondary | ICD-10-CM | POA: Diagnosis not present

## 2015-02-08 DIAGNOSIS — E785 Hyperlipidemia, unspecified: Secondary | ICD-10-CM | POA: Diagnosis present

## 2015-02-08 LAB — COMPREHENSIVE METABOLIC PANEL
ALBUMIN: 3.1 g/dL — AB (ref 3.5–5.2)
ALT: 50 U/L (ref 0–53)
ANION GAP: 9 (ref 5–15)
AST: 20 U/L (ref 0–37)
Alkaline Phosphatase: 322 U/L — ABNORMAL HIGH (ref 39–117)
BUN: 21 mg/dL (ref 6–23)
CALCIUM: 9 mg/dL (ref 8.4–10.5)
CO2: 32 mmol/L (ref 19–32)
CREATININE: 0.61 mg/dL (ref 0.50–1.35)
Chloride: 95 mmol/L — ABNORMAL LOW (ref 96–112)
GFR calc Af Amer: >90 mL/min (ref >90)
GFR calc non Af Amer: >90 mL/min (ref >90)
Glucose, Bld: 168 mg/dL — ABNORMAL HIGH (ref 70–99)
Potassium: 3.2 mmol/L — ABNORMAL LOW (ref 3.5–5.1)
Sodium: 136 mmol/L (ref 135–145)
Total Bilirubin: 0.3 mg/dL (ref 0.3–1.2)
Total Protein: 6.8 g/dL (ref 6.0–8.3)

## 2015-02-08 LAB — CBC
HEMATOCRIT: 32.3 % — AB (ref 39.0–52.0)
HEMOGLOBIN: 10.5 g/dL — AB (ref 13.0–17.0)
MCH: 28.5 pg (ref 26.0–34.0)
MCHC: 32.5 g/dL (ref 30.0–36.0)
MCV: 87.5 fL (ref 78.0–100.0)
Platelets: 290 10*3/uL (ref 150–400)
RBC: 3.69 MIL/uL — ABNORMAL LOW (ref 4.22–5.81)
RDW: 17 % — ABNORMAL HIGH (ref 11.5–15.5)
WBC: 5.7 10*3/uL (ref 4.0–10.5)

## 2015-02-08 LAB — GLUCOSE, CAPILLARY
GLUCOSE-CAPILLARY: 163 mg/dL — AB (ref 70–99)
GLUCOSE-CAPILLARY: 163 mg/dL — AB (ref 70–99)
Glucose-Capillary: 120 mg/dL — ABNORMAL HIGH (ref 70–99)
Glucose-Capillary: 126 mg/dL — ABNORMAL HIGH (ref 70–99)
Glucose-Capillary: 148 mg/dL — ABNORMAL HIGH (ref 70–99)

## 2015-02-08 MED ORDER — PANTOPRAZOLE SODIUM 40 MG IV SOLR
40.0000 mg | Freq: Every day | INTRAVENOUS | Status: DC
  Administered 2015-02-08 (×2): 40 mg via INTRAVENOUS
  Filled 2015-02-08 (×3): qty 40

## 2015-02-08 MED ORDER — DIPHENHYDRAMINE HCL 50 MG/ML IJ SOLN: 12.5000 mg | Freq: Four times a day (QID) | INTRAMUSCULAR | Status: DC | PRN

## 2015-02-08 MED ORDER — OSMOLITE 1.5 CAL PO LIQD
1000.0000 mL | ORAL | Status: DC
  Administered 2015-02-08: 1000 mL
  Filled 2015-02-08 (×2): qty 1000

## 2015-02-08 MED ORDER — HEPARIN SODIUM (PORCINE) 5000 UNIT/ML IJ SOLN
5000.0000 [IU] | Freq: Three times a day (TID) | INTRAMUSCULAR | Status: DC
  Administered 2015-02-08 (×3): 5000 [IU] via SUBCUTANEOUS
  Filled 2015-02-08 (×8): qty 1

## 2015-02-08 MED ORDER — DIPHENHYDRAMINE HCL 12.5 MG/5ML PO ELIX: 12.5000 mg | ORAL_SOLUTION | Freq: Four times a day (QID) | ORAL | Status: DC | PRN

## 2015-02-08 MED ORDER — KCL IN DEXTROSE-NACL 20-5-0.45 MEQ/L-%-% IV SOLN
INTRAVENOUS | Status: DC
  Administered 2015-02-08: 01:00:00 via INTRAVENOUS
  Administered 2015-02-09: 125 mL/h via INTRAVENOUS
  Filled 2015-02-08 (×6): qty 1000

## 2015-02-08 MED ORDER — ONDANSETRON HCL 4 MG/2ML IJ SOLN: 4.0000 mg | Freq: Four times a day (QID) | INTRAMUSCULAR | Status: DC | PRN

## 2015-02-08 MED ORDER — INSULIN ASPART 100 UNIT/ML ~~LOC~~ SOLN
0.0000 [IU] | SUBCUTANEOUS | Status: DC
  Administered 2015-02-08 (×2): 1 [IU] via SUBCUTANEOUS
  Administered 2015-02-08 (×2): 2 [IU] via SUBCUTANEOUS
  Administered 2015-02-09 (×2): 3 [IU] via SUBCUTANEOUS
  Administered 2015-02-09: 2 [IU] via SUBCUTANEOUS

## 2015-02-08 MED ORDER — OSMOLITE 1.5 CAL PO LIQD
1000.0000 mL | ORAL | Status: DC
  Filled 2015-02-08: qty 1000

## 2015-02-08 MED ORDER — MORPHINE SULFATE 2 MG/ML IJ SOLN
1.0000 mg | INTRAMUSCULAR | Status: DC | PRN
  Administered 2015-02-08 – 2015-02-09 (×4): 1 mg via INTRAVENOUS
  Filled 2015-02-08 (×4): qty 1

## 2015-02-08 MED ORDER — TECHNETIUM TC 99M SULFUR COLLOID
2.2000 | Freq: Once | INTRAVENOUS | Status: AC | PRN
  Administered 2015-02-08: 2.2 via INTRAVENOUS

## 2015-02-08 NOTE — Progress Notes (Signed)
INITIAL NUTRITION ASSESSMENT  DOCUMENTATION CODES Per approved criteria  -Not Applicable   INTERVENTION: -When able to resume tube feedings recommend Osmolite 1.5 via PEG @ 75 ml/hr over 12 hours with Prostat 60 ml BID per tube providing 1750 kcal (100% of estimated energy requirements) and 116 grams of protein and 688 ml free water.  -When advanced to diet recommenced Resource Breeze BID  NUTRITION DIAGNOSIS: Inadequate energy intake from enteral nutrition related to inappropriate formula and rate as evidenced by current home TF regimen providing > 50% of estimated needs.   Goal: Pt to meet >/= 90% of estimated needs  Monitor:  -TF initiation/tolerance, labs, weight trends  Reason for Assessment: MST = 3  69 y.o. male  Admitting Dx: <principal problem not specified>  ASSESSMENT: Pt well known to staff, discharged last Thursday, readmitted because of severe vomiting.  Pt using different tube feeding than the one he tolerated in the hospital.  Admitted for rehydration.  Pt on nocturnal tube feeding.  PMH signficant for, COPD, DM, lung cancer, protein-calorie malnutrition.    Pt has lost 5 lbs since discharge and 22% of body weight in the past 6 months (significant for time frame).  [facility] Promote to house and pt has been getting 3 cans a day during nocturnal feeds from 10 pm to 8 am providing 702 kcal (41% of estimated energy requirements) and 43 g protein (45% of protein needs).   Spoke with [place] who did not receive change in tube feeding formula after last hospitalization.  Home health now aware.  Pt received Osmolite 1.5 @ 75 ml/hr over 12 hours with Prostat 60 ml BID per tube during last hospitalization providing 1750 kcal (100% of estimated energy requirements) and 116 grams of protein.  Pt was tolerating well and needs were met.   Pt does not like Ensure/Glucerna but does enjoy [employer].  Will recommend Resource Breeze BID.    Height: Ht Readings from Last 1 Encounters:  02/07/15 $RemoveB'5\' 8"'[id number]$  (1.727 m)    Weight: Wt Readings from Last 1 Encounters:  02/07/15 130 lb 4.7 oz (59.1 kg)    Ideal Body Weight: 154 lbs  % Ideal Body Weight: 84%  Wt Readings from Last 10 Encounters:  02/07/15 130 lb 4.7 oz (59.1 kg)  02/01/15 135 lb 14.4 oz (61.644 kg)  01/11/15 138 lb 14.2 oz (63 kg)  12/07/14 157 lb 3 oz (71.3 kg)  11/21/14 154 lb 1.6 oz (69.899 kg)  11/08/14 155 lb 11.2 oz (70.625 kg)  10/22/14 156 lb 8 oz (70.988 kg)  10/11/14 170 lb 14.4 oz (77.52 kg)  09/25/14 162 lb 6.4 oz (73.664 kg)  09/19/14 165 lb (74.844 kg)    Usual Body Weight: n/a  % Usual Body Weight: n/a  BMI:  Body mass index is 19.82 kg/(m^2).  Estimated Nutritional Needs: Kcal: 8295-6213 Protein: 95-115 g Fluid:1.7-2.0 L/day  Skin: Surgical Incision on abdomen  Diet Order: Diet NPO time specified  EDUCATION NEEDS: -No education needs identified at this time   Intake/Output Summary (Last 24 hours) at 02/08/15 0950 Last data filed at 02/08/15 0600  Gross per 24 hour  Intake 656.25 ml  Output      0 ml  Net 656.25 ml    Last BM: 2/25   Labs:   Recent Labs Lab 02/07/15 2125 02/08/15 0600  NA 135 136  K 3.2* 3.2*  CL 88* 95*  CO2 32 32  BUN 27* 21  CREATININE 0.64 0.61  CALCIUM 9.9 9.0  GLUCOSE 209* 168*    CBG (last 3)   Recent Labs  02/08/15 0005 02/08/15 0359 02/08/15 0749  GLUCAP 148* 163* 120*    Scheduled Meds: . heparin  5,000 Units Subcutaneous 3 times per day  . insulin aspart  0-9 Units Subcutaneous 6 times per day  . pantoprazole (PROTONIX) IV  40 mg Intravenous QHS    Continuous Infusions: . dextrose 5 % and 0.45 % NaCl with KCl 20 mEq/L 125 mL/hr at 02/08/15 0045    Past Medical History  Diagnosis Date  . DM (diabetes mellitus)   . Pneumonia   . Hypercholesteremia   . Melanoma   . Radiation 08/03/14-08/23/14    35 gray to right chest  . Lung cancer   . FH: chemotherapy      Past Surgical History  Procedure Laterality Date  . Video bronchoscopy Bilateral 07/20/2014    Procedure: VIDEO BRONCHOSCOPY WITHOUT FLUORO;  Surgeon: Tanda Rockers, MD;  Location: [facility];  Service: Cardiopulmonary;  Laterality: Bilateral;  . Esophagogastroduodenoscopy N/A 11/30/2014    Procedure: ESOPHAGOGASTRODUODENOSCOPY (EGD);  Surgeon: [name], MD;  Location: [facility];  Service: Endoscopy;  Laterality: N/A;  . Laparotomy N/A 11/30/2014    Procedure: EXPLORATORY LAPAROTOMY, PYLOROPLASTY, OVERSEWING OF POSTERIOR DUODENUM, DUODENOSTOMY, CHOLECYSTECTOMY, JEJEUNOSTOMY;  Surgeon: Jackolyn Confer, MD;  Location: [facility];  Service: General;  Laterality: N/A;    Elmer Picker MS Dietetic Intern Pager Number [phone]

## 2015-02-08 NOTE — Progress Notes (Signed)
[facility] Progress Note     Subjective: Pt notes he was having projectile vomiting yesterday which brought him to the hospital.  He feels fine today.  Hungry and thirsty.  He thought it might be related to a different tube feed brand compared to the hospital.  He says he threw up brown liquid.  He denies any N/V currently, no abdominal pain.  +flatus and BM yesterday.     Objective: Vital signs in last 24 hours: Temp:  [97.5 F (36.4 C)-98.5 F (36.9 C)] 97.6 F (36.4 C) (02/25 0544) Pulse Rate:  [74-109] 74 (02/25 0544) Resp:  [16-20] 18 (02/25 0544) BP: (94-106)/(62-71) 101/65 mmHg (02/25 0544) SpO2:  [94 %-98 %] 98 % (02/25 0544) Weight:  [130 lb 4.7 oz (59.1 kg)] 130 lb 4.7 oz (59.1 kg) (02/24 2359) Last BM Date: 02/07/15  Intake/Output from previous day: 02/24 0701 - 02/25 0700 In: 656.3 [I.V.:656.3] Out: -  Intake/Output this shift:    PE: Gen:  Alert, NAD, pleasant Card:  RRR, no M/G/R heard Pulm:  CTA, no W/R/R Abd: Soft, ND, NT to palpation, +BS, no HSM, incisions sites well healed, J tube in LUQ, d-tube in RUQ.   Lab Results:   Recent Labs  02/07/15 2125 02/08/15 0600  WBC 9.7 5.7  HGB 12.8* 10.5*  HCT 39.1 32.3*  PLT 348 290   BMET  Recent Labs  02/07/15 2125 02/08/15 0600  NA 135 136  K 3.2* 3.2*  CL 88* 95*  CO2 32 32  GLUCOSE 209* 168*  BUN 27* 21  CREATININE 0.64 0.61  CALCIUM 9.9 9.0   PT/INR No results for input(s): LABPROT, INR in the last 72 hours. CMP     Component Value Date/Time   NA 136 02/08/2015 0600   NA 139 11/21/2014 1440   K 3.2* 02/08/2015 0600   K 3.8 11/21/2014 1440   CL 95* 02/08/2015 0600   CO2 32 02/08/2015 0600   CO2 22 11/21/2014 1440   GLUCOSE 168* 02/08/2015 0600   GLUCOSE 150* 11/21/2014 1440   BUN 21 02/08/2015 0600   BUN 14.5 11/21/2014 1440   CREATININE 0.61 02/08/2015 0600   CREATININE 0.9 11/21/2014 1440   CALCIUM 9.0 02/08/2015 0600   CALCIUM 9.8 11/21/2014 1440   PROT 6.8  02/08/2015 0600   PROT 6.8 11/21/2014 1440   ALBUMIN 3.1* 02/08/2015 0600   ALBUMIN 3.6 11/21/2014 1440   AST 20 02/08/2015 0600   AST 15 11/21/2014 1440   ALT 50 02/08/2015 0600   ALT 18 11/21/2014 1440   ALKPHOS 322* 02/08/2015 0600   ALKPHOS 95 11/21/2014 1440   BILITOT 0.3 02/08/2015 0600   BILITOT 0.83 11/21/2014 1440   GFRNONAA >90 02/08/2015 0600   GFRAA >90 02/08/2015 0600   Lipase     Component Value Date/Time   LIPASE 41 11/29/2014 0644       Studies/Results: Dg Abd Acute W/chest  02/07/2015   CLINICAL DATA:  Vomiting since Sunday. Feeding tube in the small bowel since December due to stomach ulcers.  EXAM: ACUTE ABDOMEN SERIES (ABDOMEN 2 VIEW & CHEST 1 VIEW)  COMPARISON:  01/25/2015 and 01/24/2015.  FINDINGS: Normal heart size and pulmonary vascularity. Scarring in the right upper lung and right hilum. 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 No significant change since prior study. Hyperinflation of the left lung. No focal airspace disease or consolidation.  Balloon retention catheter in the right upper quadrant and additional catheter in  the left mid abdomen. Scattered gas and stool throughout the colon. No small or large bowel distention. Air-fluid level in the stomach without significant distention. No free intra-abdominal air. No radiopaque stones. Visualized bones appear intact. Vascular calcifications.  IMPRESSION: Scarring and volume loss in the right lung consistent with radiation change. No evidence of active pulmonary disease. Left mid abdomen and right upper quadrant catheters. No evidence of bowel obstruction.   Electronically Signed   By: Lucienne Capers M.D.   On: 02/07/2015 22:21    Anti-infectives: Anti-infectives    None       Assessment/Plan 1.Recurrent nausea and vomiting -Obtain gastric emptying study, concern for gastroparesis -NPO for now, hold TF for now -May need to periodically vent d tube if he  has episodes of symptoms at home to prevent repeated hospitalizations  2. Bleeding duodenal ulcer with cholecystoduodenal fistula --EGD for control of bleeding - 11/30/14 - [name] --Ex Lap, oversewing of posterior bleeding DU, Heineke Mikulicz pyloroplasty, cholecystectomy, repair of right hepatic artery, repair of cholecystoduodenal fistula and placement of duodenostomy tube, feeding jejunostomy - 11/30/14 - [name] 3. Left pulmonary embolism - on Lovenox 4. 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 (07/2014) 5. 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 Lovenox/SCD for DVT prophylaxis 7. Acute elevation in AST/ALT and Alk Phos to the 200-300's 8. Chronic pain hands and feet    DORT, Shalon Salado 02/08/2015, 7:35 AM Pager: [phone]

## 2015-02-08 NOTE — Progress Notes (Signed)
UR completed 

## 2015-02-09 ENCOUNTER — Encounter (HOSPITAL_COMMUNITY): Payer: Self-pay | Admitting: General Surgery

## 2015-02-09 DIAGNOSIS — K3184 Gastroparesis: Secondary | ICD-10-CM

## 2015-02-09 HISTORY — DX: Gastroparesis: K31.84

## 2015-02-09 LAB — GLUCOSE, CAPILLARY
GLUCOSE-CAPILLARY: 212 mg/dL — AB (ref 70–99)
GLUCOSE-CAPILLARY: 241 mg/dL — AB (ref 70–99)
Glucose-Capillary: 188 mg/dL — ABNORMAL HIGH (ref 70–99)
Glucose-Capillary: 112 mg/dL — ABNORMAL HIGH (ref 70–99)

## 2015-02-09 MED ORDER — METOCLOPRAMIDE HCL 10 MG PO TABS
10.0000 mg | ORAL_TABLET | Freq: Three times a day (TID) | ORAL | Status: DC
  Filled 2015-02-09: qty 1

## 2015-02-09 MED ORDER — POTASSIUM CHLORIDE 20 MEQ/15ML (10%) PO SOLN
40.0000 meq | Freq: Two times a day (BID) | ORAL | Status: DC
  Administered 2015-02-09: 40 meq
  Filled 2015-02-09 (×2): qty 30

## 2015-02-09 MED ORDER — METOCLOPRAMIDE HCL 10 MG PO TABS
10.0000 mg | ORAL_TABLET | Freq: Three times a day (TID) | ORAL | Status: DC
Start: 2015-02-09 — End: 2015-02-21

## 2015-02-09 MED ORDER — PROCHLORPERAZINE MALEATE 10 MG PO TABS: 10.0000 mg | ORAL_TABLET | Freq: Four times a day (QID) | ORAL | Status: DC | PRN

## 2015-02-09 MED ORDER — POTASSIUM CHLORIDE 20 MEQ/15ML (10%) PO SOLN: 40.0000 meq | Freq: Two times a day (BID) | ORAL | Status: DC

## 2015-02-09 MED ORDER — ENOXAPARIN SODIUM 100 MG/ML ~~LOC~~ SOLN
100.0000 mg | Freq: Every day | SUBCUTANEOUS | Status: DC
  Administered 2015-02-09: 100 mg via SUBCUTANEOUS
  Filled 2015-02-09: qty 1

## 2015-02-09 MED ORDER — OSMOLITE 1.5 CAL PO LIQD: 1000.0000 mL | ORAL | Status: DC

## 2015-02-09 NOTE — Progress Notes (Signed)
Patient discharge home with wife, alert and oriented, discharge instructions given patient verbalize understanding of instructions given, My Chart access declined at this time, patient in stable condition at this time

## 2015-02-09 NOTE — Progress Notes (Signed)
Subjective: No nausea or vomiting.  Objective: Vital signs in last 24 hours: Temp:  [98 F (36.7 C)-98.8 F (37.1 C)] 98 F (36.7 C) (02/26 0554) Pulse Rate:  [71-85] 83 (02/26 0554) Resp:  [16-18] 16 (02/26 0554) BP: (92-96)/(44-60) 92/56 mmHg (02/26 0554) SpO2:  [93 %-98 %] 98 % (02/26 0554) Last BM Date: 02/07/15  Intake/Output from previous day: 02/25 0701 - 02/26 0700 In: -  Out: 550 [Urine:550] Intake/Output this shift:    PE: General- In NAD Abdomen-soft, non tender, duodenostomy and feeding jejunostomy tubes in place  Lab Results:   Recent Labs  02/07/15 2125 02/08/15 0600  WBC 9.7 5.7  HGB 12.8* 10.5*  HCT 39.1 32.3*  PLT 348 290   BMET  Recent Labs  02/07/15 2125 02/08/15 0600  NA 135 136  K 3.2* 3.2*  CL 88* 95*  CO2 32 32  GLUCOSE 209* 168*  BUN 27* 21  CREATININE 0.64 0.61  CALCIUM 9.9 9.0   PT/INR No results for input(s): LABPROT, INR in the last 72 hours. Comprehensive Metabolic Panel:    Component Value Date/Time   NA 136 02/08/2015 0600   NA 135 02/07/2015 2125   NA 139 11/21/2014 1440   NA 139 11/15/2014 0912   K 3.2* 02/08/2015 0600   K 3.2* 02/07/2015 2125   K 3.8 11/21/2014 1440   K 3.1* 11/15/2014 0912   CL 95* 02/08/2015 0600   CL 88* 02/07/2015 2125   CO2 32 02/08/2015 0600   CO2 32 02/07/2015 2125   CO2 22 11/21/2014 1440   CO2 25 11/15/2014 0912   BUN 21 02/08/2015 0600   BUN 27* 02/07/2015 2125   BUN 14.5 11/21/2014 1440   BUN 14.4 11/15/2014 0912   CREATININE 0.61 02/08/2015 0600   CREATININE 0.64 02/07/2015 2125   CREATININE 0.9 11/21/2014 1440   CREATININE 0.9 11/15/2014 0912   GLUCOSE 168* 02/08/2015 0600   GLUCOSE 209* 02/07/2015 2125   GLUCOSE 150* 11/21/2014 1440   GLUCOSE 122 11/15/2014 0912   CALCIUM 9.0 02/08/2015 0600   CALCIUM 9.9 02/07/2015 2125   CALCIUM 9.8 11/21/2014 1440   CALCIUM 9.3 11/15/2014 0912   AST 20 02/08/2015 0600   AST 28 02/07/2015 2125   AST 15 11/21/2014 1440   AST  15 11/15/2014 0912   ALT 50 02/08/2015 0600   ALT 65* 02/07/2015 2125   ALT 18 11/21/2014 1440   ALT 19 11/15/2014 0912   ALKPHOS 322* 02/08/2015 0600   ALKPHOS 448* 02/07/2015 2125   ALKPHOS 95 11/21/2014 1440   ALKPHOS 92 11/15/2014 0912   BILITOT 0.3 02/08/2015 0600   BILITOT 0.6 02/07/2015 2125   BILITOT 0.83 11/21/2014 1440   BILITOT 0.65 11/15/2014 0912   PROT 6.8 02/08/2015 0600   PROT 8.4* 02/07/2015 2125   PROT 6.8 11/21/2014 1440   PROT 6.5 11/15/2014 0912   ALBUMIN 3.1* 02/08/2015 0600   ALBUMIN 4.0 02/07/2015 2125   ALBUMIN 3.6 11/21/2014 1440   ALBUMIN 3.4* 11/15/2014 0912     Studies/Results: Nm Gastric Emptying  02/08/2015   CLINICAL DATA:  Gastric dysmotility  EXAM: NUCLEAR MEDICINE GASTRIC EMPTYING SCAN  TECHNIQUE: After oral ingestion of radiolabeled meal, sequential abdominal images were obtained for 4 hours. Percentage of activity emptying the stomach calculated at 1 hour, 2 hour, 3 hour, and 4 hours.  RADIOPHARMACEUTICALS:  Two point to Technetium 99-m labeled sulfur colloid  COMPARISON:  None.  FINDINGS: Expected location of the stomach in the left upper quadrant.  9% emptied at 1 hr ( normal >= 10%)  18% emptied at 2 hr ( normal >= 40%)  0% emptied at 3 hr ( normal >= 70%)  5% emptied at 4 hr ( normal >= 90%)  IMPRESSION: Markedly delayed gastric emptying study.   Electronically Signed   By: Franchot Gallo M.D.   On: 02/08/2015 16:54   Dg Abd Acute W/chest  02/07/2015   CLINICAL DATA:  Vomiting since Sunday. Feeding tube in the small bowel since December due to stomach ulcers.  EXAM: ACUTE ABDOMEN SERIES (ABDOMEN 2 VIEW & CHEST 1 VIEW)  COMPARISON:  01/25/2015 and 01/24/2015.  FINDINGS: Normal heart size and pulmonary vascularity. Scarring in the right upper lung and right hilum. 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 No significant change since prior study. Hyperinflation of the left lung. No focal airspace disease or  consolidation.  Balloon retention catheter in the right upper quadrant and additional catheter in the left mid abdomen. Scattered gas and stool throughout the colon. No small or large bowel distention. Air-fluid level in the stomach without significant distention. No free intra-abdominal air. No radiopaque stones. Visualized bones appear intact. Vascular calcifications.  IMPRESSION: Scarring and volume loss in the right lung consistent with radiation change. No evidence of active pulmonary disease. Left mid abdomen and right upper quadrant catheters. No evidence of bowel obstruction.   Electronically Signed   By: Lucienne Capers M.D.   On: 02/07/2015 22:21    Anti-infectives: Anti-infectives    None      Assessment 1.Recurrent nausea and vomiting -Gastric emptying scan c/w significant gastroparesis -May need to periodically vent d tube if he has episodes of symptoms at home to prevent repeated hospitalizations  2. Bleeding duodenal ulcer with cholecystoduodenal fistula --EGD for control of bleeding - 11/30/14 - [name] --Ex Lap, oversewing of posterior bleeding DU, Heineke Mikulicz pyloroplasty, cholecystectomy, repair of right hepatic artery, repair of cholecystoduodenal fistula and placement of duodenostomy tube, feeding jejunostomy - 11/30/14 - [name] 3. Left pulmonary embolism - on Lovenox 4. 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 (07/2014) 5. FEN-on j-tube feeds 6. Lovenox/SCD for DVT prophylaxis 7. Acute elevation in AST/ALT and Alk Phos to the 200-300's 8. Hypokalemia    LOS: 1 day   Plan: Correct hypokalemia.  He was taught how to vent duodenostomy tube to help relief n/v-has worked in the hospital in the past.  Will discussed treatment of gastroparesis with GI. Possibly home later today.   Odis Hollingshead 02/09/2015

## 2015-02-09 NOTE — Progress Notes (Signed)
CARE MANAGEMENT NOTE 02/09/2015  Patient:  PIERCE, BAROCIO   Account Number:  [id number]  Date Initiated:  02/09/2015  Documentation initiated by:  Edwyna Shell  Subjective/Objective Assessment:   69 yo male admitted with dehydration from home with spouse     Action/Plan:   discharge planning   Anticipated DC Date:  02/09/2015   Anticipated DC Plan:  [place]  CM consult      Choice offered to / List presented to:          [facility] arranged  HH-1 RN  [place]      [place].   Status of service:  Completed, signed off Medicare Important Message given?  NA - LOS <3 / Initial given by admissions (If response is "NO", the following Medicare IM given date fields will be blank) Date Medicare IM given:   Medicare IM given by:   Date Additional Medicare IM given:   Additional Medicare IM given by:    Discharge Disposition:  [place]  Per UR Regulation:    If discussed at Long Length of Stay Meetings, dates discussed:    Comments:  01/30/15 Edwyna Shell RN [facility] CM [phone] Patient lives at home with spouse and have [facility] services in place. Patient already has necessary DME in house. [place] orders were placed to resume with [facility] in addition to specific tube feed orders recommended by the dietician. [place] reps, Cyril Mourning and Pura Spice were made aware of the resumption of services order in addition to the tube feed orders. [place] and wife aware that first delivery of osmolite 1.5 will be today.

## 2015-02-09 NOTE — Discharge Instructions (Signed)
Gastroparesis  Gastroparesis is also called slowed stomach emptying (delayed gastric emptying). It is a condition in which the stomach takes too long to empty its contents. It often happens in people with diabetes.  CAUSES  Gastroparesis happens when nerves to the stomach are damaged or stop working. When the nerves are damaged, the muscles of the stomach and intestines do not work normally. The movement of food is slowed or stopped. High blood glucose (sugar) causes changes in nerves and can damage the blood vessels that carry oxygen and nutrients to the nerves. RISK FACTORS  Diabetes.  Post-viral syndromes.  Eating disorders (anorexia, bulimia).  Surgery on the stomach or vagus nerve.  Gastroesophageal reflux disease (rarely).  Smooth muscle disorders (amyloidosis, scleroderma).  Metabolic disorders, including hypothyroidism.  Parkinson disease. SYMPTOMS   Heartburn.  Feeling sick to your stomach (nausea).  Vomiting of undigested food.  An early feeling of fullness when eating.  Weight loss.  Abdominal bloating.  Erratic blood glucose levels.  Lack of appetite.  Gastroesophageal reflux.  Spasms of the stomach wall. Complications can include:  Bacterial overgrowth in stomach. Food stays in the stomach and can ferment and cause bacteria to grow.  Weight loss due to difficulty digesting and absorbing nutrients.  Vomiting.  Obstruction in the stomach. Undigested food can harden and cause nausea and vomiting.  Blood glucose fluctuations caused by inconsistent food absorption. DIAGNOSIS  The diagnosis of gastroparesis is confirmed through one or more of the following tests:  [place] X-rays and scans. These tests look at how long it takes for food to move through the stomach.  Gastric manometry. This test measures electrical and muscular activity in the stomach. A thin tube is passed down the throat into the stomach. The tube contains a wire that takes measurements  of the stomach's electrical and muscular activity as it digests liquids and solid food.  Endoscopy. This procedure is done with a long, thin tube called an endoscope. It is passed through the mouth and gently down the esophagus into the stomach. This tube helps the caregiver look at the lining of the stomach to check for any abnormalities.  Ultrasonography. This can rule out gallbladder disease or pancreatitis. This test will outline and define the shape of the gallbladder and pancreas. TREATMENT   Treatments may include:  Exercise.  Medicines to control nausea and vomiting.  Medicines to stimulate stomach muscles.  Changes in what and when you eat.  Having smaller meals more often.  Eating low-fiber forms of high-fiber foods, such as eating cooked vegetables instead of raw vegetables.  Eating low-fat foods.  Consuming liquids, which are easier to digest.  In severe cases, feeding tubes and intravenous (IV) feeding may be needed. It is important to note that in most cases, treatment does not cure gastroparesis. It is usually a lasting (chronic) condition. Treatment helps you manage the underlying condition so that you can be as healthy and comfortable as possible. Other treatments  A gastric neurostimulator has been developed to assist people with gastroparesis. The battery-operated device is surgically implanted. It emits mild electrical pulses to help improve stomach emptying and to control nausea and vomiting.  The use of botulinum toxin has been shown to improve stomach emptying by decreasing the prolonged contractions of the muscle between the stomach and the small intestine (pyloric sphincter). The benefits are temporary. SEEK MEDICAL CARE IF:   You have diabetes and you are having problems keeping your blood glucose in goal range.  You are having nausea,  vomiting, bloating, or early feelings of fullness with eating.  Your symptoms do not change with a change in  diet. Document Released: 12/01/2005 Document Revised: 03/28/2013 Document Reviewed: 05/10/2009 [facility] Patient Information 2015 [place], [place]. This information is not intended to replace advice given to you by your health care provider. Make sure you discuss any questions you have with your health care provider.  Your can put your tube to straight drain any time you have nausea and then clamp again when you feel better. YOU need to watch your glucose very closely since you are eating less to be sure the glucose does not drop.  If your glucose is low, call Dr. Greta Doom and let her readjust your medicines.[name] Keep a record of your Oral intake and sugars so Dr. Greta Doom can see how they correlate.

## 2015-02-09 NOTE — Discharge Summary (Signed)
Physician Discharge Summary  Patient ID: Daryl Vega MRN: [id number] DOB/AGE: [birth year] 69 y.o.  Admit date: 02/07/2015 Discharge date: 02/09/2015  Admission Diagnoses:  1. Recurrent Nausea and vomiting 2. Bleeding duodenal ulcer with cholecystoduodenal fistula --EGD for control of bleeding - 11/30/14 - [name] --Ex Lap, oversewing of posterior bleeding DU, Heineke Mikulicz pyloroplasty, cholecystectomy, repair of right hepatic artery, repair of cholecystoduodenal fistula and placement of duodenostomy tube, feeding jejunostomy - 11/30/14 - [name] 3. Left pulmonary embolism - on Lovenox 4. 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 (07/2014) 5. 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 Lovenox/SCD for DVT prophylaxis 7. Acute elevation in AST/ALT and Alk Phos to the 200-300's 8. Chronic pain hands and feet   Discharge Diagnoses:   1.  Recurrent nausea and vomiting with Gastroparesis 2. Bleeding duodenal ulcer with cholecystoduodenal fistula --EGD for control of bleeding - 11/30/14 - [name] --Ex Lap, oversewing of posterior bleeding DU, Heineke Mikulicz pyloroplasty, cholecystectomy, repair of right hepatic artery, repair of cholecystoduodenal fistula and placement of duodenostomy tube, feeding jejunostomy - 11/30/14 - [name] 3. Left pulmonary embolism - on Lovenox 4. 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 (07/2014) 5. FEN-on j-tube feeds 6. Lovenox/SCD for DVT prophylaxis 7. Acute elevation in AST/ALT and Alk Phos to the 200-300's 8. Hypokalemia on potassium supplement 9.  Diabetes on oral agents 10.  Malnutrition 5 pound weight loss since last admit  Active Problems:   Dehydration   Gastroparesis   PROCEDURES: NUCLEAR MEDICINE GASTRIC EMPTYING SCAN: Expected location of the stomach in the left  upper quadrant. 9% emptied at 1 hr ( normal >= 10%) 18% emptied at 2 hr ( normal >= 40%) 0% emptied at 3 hr ( normal >= 70%) 5% emptied at 4 hr ( normal >= 90%) Markedly delayed gastric emptying study 02/08/15  Hospital Course:  Daryl Vega is an 69 y.o. male well known to the [facility] MD service who was discharge last Thursday and whose wife brought him in tonight because of vomiting that she felt was severe and she felt uncomfortable. Duodenal tube is still in place and she is still able to perform tube feedings thru his jejunostomy tube.  He ws admitted for rehydration, by Dr. Hassell Done who was on call.  We performed a gastric emptying study on 02/08/15.  He has had no further nausea since he came back in.  As noted above he has significant gastroparesis.  [name] taught him how to open his duodenostomy tube to drain his stomach as needed for nausea.  He also discussed him with [name], and started him on Reglan.  Dietician has seen him and adjusted his tube feedings.  I will ask him to watch his glucose at home, keep a record of his PO intake and glucose so Dr. Greta Doom can adjust his diabetic medicines as needed.  We are going to suggest he eat just what he needs for comfort and allow the tube feedings to be his primary source of nutrition.  He is taking some oral supplements but he fill very quickly so the volume is not very large. We will have him follow up with all his physicians as noted below.  I went over the discharge instructions with Daryl Vega and his wife.  They are instructed to open the duodenostomy tube first if he has nausea,  then use the Compazine.  If he is having more nausea decrease the volume of his PO intake.  They understand most of his nutrition will be thru the feeding jejunostomy tube.  Condition on d/c:  stable    Disposition: 06-Home-Health Care Svc     Medication List    STOP taking these medications        Cinnamon 500 MG capsule      oxyCODONE 5 MG immediate release tablet  Commonly known as:  Oxy IR/ROXICODONE      TAKE these medications        atorvastatin 80 MG tablet  Commonly known as:  LIPITOR  Take 80 mg by mouth daily.     bismuth subsalicylate 010 UV/25DG suspension  Commonly known as:  PEPTO BISMOL  Place 30 mLs into feeding tube every 4 (four) hours as needed for indigestion.     budesonide-formoterol 160-4.5 MCG/ACT inhaler  Commonly known as:  SYMBICORT  Take 2 puffs first thing in am and then another 2 puffs about 12 hours later.     calcium citrate-vitamin D 315-200 MG-UNIT per tablet  Commonly known as:  CITRACAL+D  Take 1 tablet by mouth daily.     DULoxetine 20 MG capsule  Commonly known as:  CYMBALTA  Take 1 capsule (20 mg total) by mouth daily.     enoxaparin 100 MG/ML injection  Commonly known as:  LOVENOX  Inject 1 mL (100 mg total) into the skin daily.     FARXIGA 5 MG Tabs tablet  Generic drug:  dapagliflozin propanediol  Take 5 mg by mouth 2 (two) times daily.     feeding supplement (OSMOLITE 1.5 CAL) Liqd  Place 1,000 mLs into feeding tube continuous. Continue Osmolite 1.5 @ 75 ml/hr over 12 hours (8pm-8am)     feeding supplement (RESOURCE BREEZE) Liqd  Take 1 Container by mouth 2 (two) times daily between meals.     feeding supplement (PRO-STAT SUGAR FREE 64) Liqd  Place 60 mLs into feeding tube 2 (two) times daily.     glimepiride 4 MG tablet  Commonly known as:  AMARYL  Take 4 mg by mouth 2 (two) times daily. Take before meals     LORazepam 0.5 MG tablet  Commonly known as:  ATIVAN  Take 1 table by mouth every 12 hours as needed for anxiety     metFORMIN 500 MG tablet  Commonly known as:  GLUCOPHAGE  Take 500 mg by mouth 2 (two) times daily with a meal.     metoCLOPramide 10 MG tablet  Commonly known as:  REGLAN  Take 1 tablet (10 mg total) by mouth 4 (four) times daily -  before meals and at bedtime.     morphine 15 MG tablet  Commonly known as:  MSIR   Take 1 tablet (15 mg total) by mouth every 6 (six) hours as needed for severe pain.     multivitamins with iron Tabs tablet  Take 1 tablet by mouth daily after supper.     pantoprazole 40 MG tablet  Commonly known as:  PROTONIX  Take 1 tablet (40 mg total) by mouth 2 (two) times daily.     potassium chloride SA 20 MEQ tablet  Commonly known as:  K-DUR,KLOR-CON  Take 2 tablets (40 mEq total) by mouth daily.     prochlorperazine 10 MG tablet  Commonly known as:  COMPAZINE  Take 1 tablet (10 mg total) by mouth every 6 (six) hours as needed for nausea  or vomiting.     saccharomyces boulardii 250 MG capsule  Commonly known as:  FLORASTOR  Take 1 capsule (250 mg total) by mouth 2 (two) times daily.     temazepam 15 MG capsule  Commonly known as:  RESTORIL  Take 1 capsule (15 mg total) by mouth at bedtime as needed for sleep.       Follow-up Information    Follow up with ROSENBOWER,TODD J, MD. Schedule an appointment as soon as possible for a visit in 3 weeks.   Specialty:  General Surgery   Why:  Be sure to keep tract of your glucose at home and if your glucose is down, you need to call Dr. Greta Doom for guidance on your diabetes Medicine.      Contact information:   [place] [address] [phone]       Follow up with [name], MD.   Specialty:  Family Medicine   Why:  Call if your glucose is low or you have other Medical issues.   Contact information:   [place] [address] [phone]       Follow up with Prince Solian, PA-C.   Specialty:  Physician Assistant   Why:  Preferred Pain management.  Stick to the pain medicines she has given you, if you need more call for follow up with their office.   Contact information:   [address] [place] [address] [phone]       Follow up with [name], MD.   Specialty:  Gastroenterology   Why:  Make an appointment  for 1-weeks to be seen by them for your gastroparesis.   Contact information:  Pt and wife already have an appointment.   [address] [place] [address] [phone]       Follow up with Eilleen Kempf., MD.   Specialty:  Oncology   Why:  Follow up with Dr. Julien Nordmann as directed by Dr. Julien Nordmann.   Contact information:   [place] [address] [phone]       Signed: Earnstine Regal 02/09/2015, 11:14 AM

## 2015-02-12 ENCOUNTER — Ambulatory Visit: Payer: BC Managed Care – PPO | Admitting: Gastroenterology

## 2015-02-14 ENCOUNTER — Encounter (INDEPENDENT_AMBULATORY_CARE_PROVIDER_SITE_OTHER): Payer: Self-pay | Admitting: General Surgery

## 2015-02-14 NOTE — Progress Notes (Signed)
Patient ID: Daryl Vega, male   DOB: 1946/07/12, 69 y.o.   MRN: [id number] Tyrail [place]. [place] 02/14/2015 11:35 AM Location: [facility] Patient #: [id number] DOB: 03-12-1946 Married / Language: English / Race: White Male History of Present Illness Odis Hollingshead MD; 02/14/2015 12:20 PM) Patient words: reck j tube.  The patient is a 69 year old male    Note:He is here for follow-up visit. He was recently admitted to the hospital because of nausea, vomiting, dehydration. Nuclear medicine gastric emptying scan demonstrated significant delay in his gastric emptying over 4 hour period. He was discharged on oral Reglan but it makes him sick. He is continued to have some episodes of nausea and vomiting. He is not able to keep much of anything in. The stitches from his duodenostomy tube came loose from the skin. He continues on tube feedings.  Allergies (Sonya Bynum, CMA; 02/14/2015 11:36 AM) No Known Drug Allergies 01/24/2015  Medication History (Sonya Bynum, CMA; 02/14/2015 11:37 AM) FentaNYL (12MCG/HR Patch 72HR, Transdermal as needed) Active. Hydrocodone-Acetaminophen (5-325MG  Tablet, Oral as needed) Active. OxyCODONE HCl (5MG  Tablet, Oral as needed) Active. LORazepam (0.5MG  Tablet, Oral as needed) Active. Temazepam (15MG  Capsule, Oral daily) Active. Atorvastatin Calcium (80MG  Tablet, Oral daily) Active. DULoxetine HCl (20MG  Capsule DR Part, Oral daily) Active. Enoxaparin Sodium (100MG /ML Solution, Subcutaneous as directed) Active. Glimepiride (4MG  Tablet, Oral daily) Active. Klor-Con M20 [facility] Tablet ER, Oral daily) Active. MetFORMIN HCl ER (500MG  Tablet ER 24HR, Oral daily) Active. Prochlorperazine Maleate (10MG  Tablet, Oral as needed) Active. Symbicort (160-4.5MCG/ACT Aerosol, Inhalation as needed) Active. Multivitamins (Oral daily) Active. Pepto-Bismol (524MG /30ML Suspension, Oral as needed) Active. Florastor (250MG  Capsule, Oral daily)  Active. Farxiga (5MG  Tablet, Oral daily) Active. Calcium-Vitamin D (250-125MG -UNIT Tablet, Oral daily) Active. Medications Reconciled    Vitals (Sonya Bynum CMA; 02/14/2015 11:36 AM) 02/14/2015 11:35 AM Weight: 130 lb Height: 66in Body Surface Area: 1.66 m Body Mass Index: 20.98 kg/m Temp.: 97.75F(Temporal)  Pulse: 79 (Regular)  BP: 128/74 (Sitting, Left Arm, Standard)     Physical Exam Odis Hollingshead MD; 02/14/2015 12:20 PM)  The physical exam findings are as follows: Note:General-cachectic frail appearing male. His wife is with him.  Abdomen-soft, incision is healed, duodenostomy tube in place in right upper quadrant-this was removed. Feeding jejunostomy in place and left abdomen.    Assessment & Plan Odis Hollingshead MD; 02/14/2015 12:21 PM)  BLEEDING DUODENAL ULCER (532.40  K26.4) Impression: Status post oversewing.  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 Tube was removed today.  OTHER PULMONARY EMBOLISM WITHOUT ACUTE COR PULMONALE (415.19  I26.99)  Current Plans Follow up in 2 weeks or as needed Instructions: Change dressing over her drain site daily or more frequently if needed. Give 7 ounces of water through the feeding tube at least twice a day. GASTROPARESIS (536.3  K31.84) Impression: Safir on gastric emptying scan. Was not able to tolerate Reglan.  Plan: Referral to gastroenterology. Wound care instructions given. Return visit 2 weeks. I also instructed his wife to give him 7 ounces of water through the jejunostomy tube twice a day.  Jackolyn Confer, MD

## 2015-02-19 ENCOUNTER — Telehealth: Payer: Self-pay

## 2015-02-19 NOTE — Telephone Encounter (Signed)
Called pt to set up appt and no answer message left for return call

## 2015-02-20 ENCOUNTER — Encounter: Payer: Self-pay | Admitting: *Deleted

## 2015-02-20 NOTE — Telephone Encounter (Signed)
Pt has been set up for ROV with Nevin Bloodgood for 02/21/15 2 pm

## 2015-02-21 ENCOUNTER — Encounter: Payer: Self-pay | Admitting: Nurse Practitioner

## 2015-02-21 ENCOUNTER — Ambulatory Visit (INDEPENDENT_AMBULATORY_CARE_PROVIDER_SITE_OTHER): Payer: Medicare Other | Admitting: Nurse Practitioner

## 2015-02-21 ENCOUNTER — Ambulatory Visit (INDEPENDENT_AMBULATORY_CARE_PROVIDER_SITE_OTHER)
Admission: RE | Admit: 2015-02-21 | Discharge: 2015-02-21 | Disposition: A | Discharge status: Home / Self Care | Payer: Medicare Other | Source: Ambulatory Visit | Attending: Nurse Practitioner | Admitting: Nurse Practitioner

## 2015-02-21 VITALS — BP 104/60 | HR 84

## 2015-02-21 DIAGNOSIS — R112 Nausea with vomiting, unspecified: Secondary | ICD-10-CM

## 2015-02-21 MED ORDER — PROMETHAZINE HCL 25 MG RE SUPP: 25.0000 mg | Freq: Four times a day (QID) | RECTAL | Status: DC | PRN

## 2015-02-21 MED ORDER — PROCHLORPERAZINE 25 MG RE SUPP: 25.0000 mg | Freq: Four times a day (QID) | RECTAL | Status: DC | PRN

## 2015-02-21 NOTE — Patient Instructions (Addendum)
Please pick up your phenergan suppositories from your pharmacy.  Insert 1 every 6 hours as needed for nausea and vomiting.  May be sedating.  Use caution with driving and other physical activities Please hold your j tube feedings tonight Please contact your primary care MD for instructions regarding possibly needing to hold your diabetes medications while vomiting.

## 2015-02-21 NOTE — Progress Notes (Signed)
History of Present Illness:   Patient is a 69 year old male with a history of stage IIIB non-small cell lung cancer diagnosed April 2015. He is status post radiation and was undergoing chemotherapy until he had an uncontrollable upper GI bleed in December. .  Inpatient EGD in December revealed several small, clean-based ulcers in the distal stomach, pan- gastritis and a large duodenal ulcer with a nonbleeding visible vessel which was injected and treated with heater probe. Cautery resulted in a significant pulsatile bleeding from the vessel, patient was taken emergently to the operating room . He underwent an exploratory laparotomy with oversewing of a posterior bleeding duodenal ulcer, pyloroplasty, cholecystectomy, repair of right hepatic artery, repair of cholecystoduodenal fistula and placement of a duodenostomy tube and feeding jejunostomy. Patient discharged to skilled facility early January then readmitted lfor a few days late January with a PE for which he is still on Lovenox.   Patient had outpatient surgical follow up on 01/24/15. He complained of nausea / vomiting at the time and was readmited to the hospital. UGI series negative for obstruction - contrast went around the balloon.  Patient continued to vomit so surgery removed some fluid from his duodenostomy cuff. Following this, nausea / vomiting improved, patient began taking PO and was discharged home. .   Patient returned to ED late Feb with recurrent vomiting. Surgery admitted him. A gastric emptying study revealed significant gastroparesis. Patient was taught to vent duodenostomy tube to relieve vomiting. He was discharged home after a couple of days. Patient saw surgery for hospital follow up on 02/14/15. Reglan prescribed upon hospital discharge made him ill. Duodenostomy tube was removed at surgery visit and patient's vomiting immediately subsided. Unfortunately it recurred today. Emesis is thin, green. No diarrhea, in fact he is  somewhat constipated. No abdominal pain.   Current Medications, Allergies, Past Medical History, Past Surgical History, Family History and Social History were reviewed in [employer] record.  Studies:   Nm Gastric Emptying  02/08/2015   CLINICAL DATA:  Gastric dysmotility  EXAM: NUCLEAR MEDICINE GASTRIC EMPTYING SCAN  TECHNIQUE: After oral ingestion of radiolabeled meal, sequential abdominal images were obtained for 4 hours. Percentage of activity emptying the stomach calculated at 1 hour, 2 hour, 3 hour, and 4 hours.  RADIOPHARMACEUTICALS:  Two point to Technetium 99-m labeled sulfur colloid  COMPARISON:  None.  FINDINGS: Expected location of the stomach in the left upper quadrant.  9% emptied at 1 hr ( normal >= 10%)  18% emptied at 2 hr ( normal >= 40%)  0% emptied at 3 hr ( normal >= 70%)  5% emptied at 4 hr ( normal >= 90%)  IMPRESSION: Markedly delayed gastric emptying study.   Electronically Signed   By: Franchot Gallo M.D.   On: 02/08/2015 16:54   Physical Exam: General: Thin white male appearing chronically ill but in no acute distress Head: Normocephalic and atraumatic Eyes:  sclerae anicteric, conjunctiva pink  Ears: Normal auditory acuity Lungs: Clear throughout to auscultation Heart: Regular rate and rhythm Abdomen: limited exam, patient in wheelchair. Abdomen is soft, non distended, non-tender. Active bowel sounds Musculoskeletal: Symmetrical with no gross deformities  Extremities: No edema  Neurological: Alert oriented x 4, grossly nonfocal Psychological:  Alert and cooperative. Normal mood and affect  Assessment and Recommendations:   57. 69 year old male with hospitalized in December with uncontrollable bleeding from a large duodenal ulcer eroding into the gastroduodenal artery. Patient underwent emergent exploratory laparotomy with oversewing  of the ulcer. 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 The gallbladder was removed but that was complicated by bleeding from the hepatic artery which had to be repaired. The fistula was repaired but drain left in place to facilitate healing. The drain was removed a week ago.   2. Nausea and vomiting requiring two admissions since December surgery. Recent gastric emptying markedly abnormal but patient was probably getting pain meds at the time?  Vomiting improved after removal of duodenal drain last week but now with reoccurence. Surgery asked [place] to evaluate. No associated abdominal pain. Patient unable to tolerate reglan.   Doubt obstructive process but will get abdominal films. Hold jtube feedings tonight but administer water via tube as directed by surgery to avoid dehydration.   He is taking oral compazine, not sure it is being absorbed. Will try phenergan supp  Patient is taking daily narcotics under direction of Pain Management. Unfortunately pain meds may be causing delayed gastric emptying. His blood sugars aren't being checked on a regular basis but it was around 200 yesterday (before vomiting started)   Patient has lung cancer. Of note, no mets on brain CTscan late January  Asked patient to check with provider who manages his diabetes. Oral hypoglycemic agents may need to be held given vomiting / NPO status.   My plan is to discuss case with patient's primary GI, Dr. [name] tomorrow.

## 2015-02-22 ENCOUNTER — Encounter: Payer: Self-pay | Admitting: Nurse Practitioner

## 2015-02-26 NOTE — Progress Notes (Signed)
I agree with the above plan, note. I see that he's been feeling a bit better.  If symptoms return, would arrange UGI with SBFT (at least through proximal small bowel) to get an idea of any anatomic issues now (following complex ulcer surgery recently)

## 2015-03-09 ENCOUNTER — Telehealth (HOSPITAL_COMMUNITY): Payer: Self-pay | Admitting: Specialist

## 2015-03-09 ENCOUNTER — Ambulatory Visit (HOSPITAL_COMMUNITY): Payer: Self-pay | Admitting: Specialist

## 2015-03-09 NOTE — Telephone Encounter (Signed)
Wife called to cx apptment and did not want to reschedule at this time

## 2015-03-12 ENCOUNTER — Emergency Department (HOSPITAL_COMMUNITY)
Admission: EM | Admit: 2015-03-12 | Discharge: 2015-03-12 | Disposition: A | Discharge status: Home / Self Care | Payer: Medicare Other | Attending: Emergency Medicine | Admitting: Emergency Medicine

## 2015-03-12 ENCOUNTER — Encounter (HOSPITAL_COMMUNITY): Payer: Self-pay | Admitting: Emergency Medicine

## 2015-03-12 DIAGNOSIS — Z85118 Personal history of other malignant neoplasm of bronchus and lung: Secondary | ICD-10-CM | POA: Diagnosis not present

## 2015-03-12 DIAGNOSIS — E780 Pure hypercholesterolemia: Secondary | ICD-10-CM | POA: Insufficient documentation

## 2015-03-12 DIAGNOSIS — Z86711 Personal history of pulmonary embolism: Secondary | ICD-10-CM | POA: Diagnosis not present

## 2015-03-12 DIAGNOSIS — Z431 Encounter for attention to gastrostomy: Secondary | ICD-10-CM | POA: Diagnosis present

## 2015-03-12 DIAGNOSIS — Y833 Surgical operation with formation of external stoma as the cause of abnormal reaction of the patient, or of later complication, without mention of misadventure at the time of the procedure: Secondary | ICD-10-CM | POA: Diagnosis not present

## 2015-03-12 DIAGNOSIS — K9423 Gastrostomy malfunction: Secondary | ICD-10-CM | POA: Insufficient documentation

## 2015-03-12 DIAGNOSIS — D649 Anemia, unspecified: Secondary | ICD-10-CM | POA: Insufficient documentation

## 2015-03-12 DIAGNOSIS — Z8619 Personal history of other infectious and parasitic diseases: Secondary | ICD-10-CM | POA: Diagnosis not present

## 2015-03-12 DIAGNOSIS — Z923 Personal history of irradiation: Secondary | ICD-10-CM | POA: Diagnosis not present

## 2015-03-12 DIAGNOSIS — J449 Chronic obstructive pulmonary disease, unspecified: Secondary | ICD-10-CM | POA: Insufficient documentation

## 2015-03-12 DIAGNOSIS — Z79899 Other long term (current) drug therapy: Secondary | ICD-10-CM | POA: Insufficient documentation

## 2015-03-12 DIAGNOSIS — Z85820 Personal history of malignant melanoma of skin: Secondary | ICD-10-CM | POA: Diagnosis not present

## 2015-03-12 DIAGNOSIS — T85598A Other mechanical complication of other gastrointestinal prosthetic devices, implants and grafts, initial encounter: Secondary | ICD-10-CM

## 2015-03-12 DIAGNOSIS — Z8701 Personal history of pneumonia (recurrent): Secondary | ICD-10-CM | POA: Insufficient documentation

## 2015-03-12 DIAGNOSIS — Z9221 Personal history of antineoplastic chemotherapy: Secondary | ICD-10-CM | POA: Insufficient documentation

## 2015-03-12 DIAGNOSIS — Z7901 Long term (current) use of anticoagulants: Secondary | ICD-10-CM | POA: Insufficient documentation

## 2015-03-12 DIAGNOSIS — E119 Type 2 diabetes mellitus without complications: Secondary | ICD-10-CM | POA: Diagnosis not present

## 2015-03-12 DIAGNOSIS — Z87891 Personal history of nicotine dependence: Secondary | ICD-10-CM | POA: Diagnosis not present

## 2015-03-12 NOTE — ED Provider Notes (Signed)
CSN: [id number]     Arrival date & time 03/12/15  1551 History   First MD Initiated Contact with Patient 03/12/15 1828     Chief Complaint  Patient presents with  . feeding tube problems      (Consider location/radiation/quality/duration/timing/severity/associated sxs/prior Treatment) HPI Comments: 69 yo male with history of gastroparesis who has a jejunostomy tube who presents because his tube stopped working.  It stopped working completely today.  Has been constant.  He has tried coke and putting a wire through the entrance to the tube without success.    Pt has no other complaints, no abdominal pain and no fevers.    Per wife, his surgeons are considering removing the tube because he has been taking PO better.     Past Medical History  Diagnosis Date  . DM (diabetes mellitus)   . Pneumonia   . Hypercholesteremia   . Melanoma   . Radiation 08/03/14-08/23/14    35 gray to right chest  . Lung cancer   . FH: chemotherapy   . Gastroparesis 02/09/2015  . Cholecystoduodenal fistula   . Pulmonary embolus   . Duodenal ulcer   . COPD (chronic obstructive pulmonary disease)   . Protein calorie malnutrition   . Elevated LFTs   . C. difficile enteritis   . Anemia    Past Surgical History  Procedure Laterality Date  . Video bronchoscopy Bilateral 07/20/2014    Procedure: VIDEO BRONCHOSCOPY WITHOUT FLUORO;  Surgeon: Tanda Rockers, MD;  Location: [facility];  Service: Cardiopulmonary;  Laterality: Bilateral;  . Esophagogastroduodenoscopy N/A 11/30/2014    Procedure: ESOPHAGOGASTRODUODENOSCOPY (EGD);  Surgeon: [name], MD;  Location: [facility];  Service: Endoscopy;  Laterality: N/A;  . Laparotomy N/A 11/30/2014    Procedure: EXPLORATORY LAPAROTOMY, PYLOROPLASTY, OVERSEWING OF POSTERIOR DUODENUM, DUODENOSTOMY, CHOLECYSTECTOMY, JEJEUNOSTOMY;  Surgeon: Jackolyn Confer, MD;  Location: [facility];  Service: General;  Laterality: N/A;  . Pyloroplasty    . Cholecystectomy    .  Duodenostomy tube    . Jejunostomy feeding tube     Family History  Problem Relation Age of Onset  . Heart disease Mother   . Cancer Mother     ? type   History  Substance Use Topics  . Smoking status: Former Smoker -- 2.00 packs/day for 50 years    Types: Cigarettes, Cigars    Quit date: 06/12/2014  . Smokeless tobacco: Current User    Types: Chew  . Alcohol Use: No    Review of Systems  All other systems reviewed and are negative.     Allergies  Review of patient's allergies indicates no known allergies.  Home Medications   Prior to Admission medications   Medication Sig Start Date End Date Taking? Authorizing Provider  atorvastatin (LIPITOR) 80 MG tablet Take 80 mg by mouth at bedtime.    Yes Historical Provider, MD  bismuth subsalicylate (PEPTO BISMOL) 262 MG/15ML suspension Place 30 mLs into feeding tube every 4 (four) hours as needed for indigestion. 01/11/15  Yes Venetia Maxon Rama, MD  budesonide-formoterol (SYMBICORT) 160-4.5 MCG/ACT inhaler Take 2 puffs first thing in am and then another 2 puffs about 12 hours later. Patient taking differently: Inhale 2 puffs into the lungs every 12 (twelve) hours as needed.  01/11/15  Yes Venetia Maxon Rama, MD  calcium citrate-vitamin D (CITRACAL+D) 315-200 MG-UNIT per tablet Take 1 tablet by mouth at bedtime.    Yes Historical Provider, MD  Dapagliflozin Propanediol (FARXIGA) 5 MG TABS Take 5 mg by  mouth 2 (two) times daily.   Yes Historical Provider, MD  DULoxetine (CYMBALTA) 20 MG capsule Take 1 capsule (20 mg total) by mouth daily. 01/11/15  Yes Christina P Rama, MD  enoxaparin (LOVENOX) 100 MG/ML injection Inject 1 mL (100 mg total) into the skin daily. 01/11/15  Yes Christina P Rama, MD  glimepiride (AMARYL) 4 MG tablet Take 4 mg by mouth at bedtime. Take before meals   Yes Historical Provider, MD  LORazepam (ATIVAN) 0.5 MG tablet Take 1 table by mouth every 12 hours as needed for anxiety 12/19/14  Yes Nishant Dhungel, MD  metFORMIN  (GLUCOPHAGE) 500 MG tablet Take 500 mg by mouth 2 (two) times daily with a meal.   Yes Historical Provider, MD  morphine (MSIR) 15 MG tablet Take 1 tablet (15 mg total) by mouth every 6 (six) hours as needed for severe pain. 01/31/15  Yes Megan N Dort, PA-C  Multiple Vitamins-Iron (MULTIVITAMINS WITH IRON) TABS tablet Take 1 tablet by mouth daily after supper. 12/19/14  Yes Nishant Dhungel, MD  Nutritional Supplements (FEEDING SUPPLEMENT, OSMOLITE 1.5 CAL,) LIQD Place 1,000 mLs into feeding tube continuous. Continue Osmolite 1.5 @ 75 ml/hr over 12 hours (8pm-8am) 01/31/15  Yes Megan N Dort, PA-C  pantoprazole (PROTONIX) 40 MG tablet Take 1 tablet (40 mg total) by mouth 2 (two) times daily. 12/19/14  Yes Nishant Dhungel, MD  potassium chloride SA (K-DUR,KLOR-CON) 20 MEQ tablet Take 2 tablets (40 mEq total) by mouth daily. 12/19/14  Yes Nishant Dhungel, MD  prochlorperazine (COMPAZINE) 10 MG tablet Take 1 tablet (10 mg total) by mouth every 6 (six) hours as needed for nausea or vomiting. 02/09/15  Yes Earnstine Regal, PA-C  promethazine (PHENERGAN) 25 MG suppository Place 1 suppository (25 mg total) rectally every 6 (six) hours as needed for nausea or vomiting. 02/21/15  Yes Willia Craze, NP  saccharomyces boulardii (FLORASTOR) 250 MG capsule Take 1 capsule (250 mg total) by mouth 2 (two) times daily. 12/19/14  Yes Nishant Dhungel, MD  Amino Acids-Protein Hydrolys (FEEDING SUPPLEMENT, PRO-STAT SUGAR FREE 64,) LIQD Place 60 mLs into feeding tube 2 (two) times daily. Patient not taking: Reported on 03/12/2015 01/31/15   Megan N Dort, PA-C  feeding supplement, RESOURCE BREEZE, (RESOURCE BREEZE) LIQD Take 1 Container by mouth 2 (two) times daily between meals. Patient not taking: Reported on 03/12/2015 01/31/15   Megan N Dort, PA-C  fentaNYL (DURAGESIC - DOSED MCG/HR) 25 MCG/HR patch Place 25 mcg onto the skin every 3 (three) days.    Historical Provider, MD  temazepam (RESTORIL) 15 MG capsule Take 1 capsule (15 mg  total) by mouth at bedtime as needed for sleep. 11/01/14   Curt Bears, MD   BP 91/58 mmHg  Pulse 93  Temp(Src) 98.7 F (37.1 C) (Oral)  Resp 14  SpO2 95% Physical Exam  Constitutional: He is oriented to person, place, and time. He appears well-developed and well-nourished. No distress.  HENT:  Head: Normocephalic and atraumatic.  Eyes: Conjunctivae are normal. No scleral icterus.  Neck: Neck supple.  Cardiovascular: Normal rate and intact distal pulses.   Pulmonary/Chest: Effort normal. No stridor. No respiratory distress.  Abdominal: Normal appearance. He exhibits no distension. There is no tenderness. There is no rigidity, no rebound and no guarding.    Neurological: He is alert and oriented to person, place, and time.  Skin: Skin is warm and dry. No rash noted.  Psychiatric: He has a normal mood and affect. His behavior is normal.  Nursing note  and vitals reviewed.   ED Course  Procedures (including critical care time) Labs Review Labs Reviewed - No data to display  Imaging Review No results found.   EKG Interpretation None      MDM   Final diagnoses:  Feeding tube blocked, initial encounter    69 yo male with feeding tube which has stopped working.  We are unable to relieve obstruction in the ED.  He is able to tolerate some PO intake.  His tube tract is still relatively new (it's a few months old).  He does not appear dehydrated.  It seems reasonable to let him follow up with his surgeon tomorrow in clinic as he can maintain hydration and take his medications by mouth.       Serita Grit, MD 03/12/15 [id number]

## 2015-03-12 NOTE — ED Notes (Signed)
Pt having trouble with feeding tube, nothing will flush or pull back from tube. Also states stitches have came undone.

## 2015-03-12 NOTE — ED Notes (Addendum)
MD at bedside.  Patient's wife reports attempting to use coke and "wire" today and not being able to clear PEG tube. Attempted warm coke with no result.

## 2015-03-13 ENCOUNTER — Other Ambulatory Visit (INDEPENDENT_AMBULATORY_CARE_PROVIDER_SITE_OTHER): Payer: Self-pay

## 2015-03-13 ENCOUNTER — Other Ambulatory Visit (HOSPITAL_COMMUNITY): Payer: Self-pay | Admitting: General Surgery

## 2015-03-13 ENCOUNTER — Ambulatory Visit (HOSPITAL_COMMUNITY)
Admission: RE | Admit: 2015-03-13 | Discharge: 2015-03-13 | Disposition: A | Discharge status: Home / Self Care | Payer: Self-pay | Source: Ambulatory Visit | Attending: Diagnostic Radiology | Admitting: Diagnostic Radiology

## 2015-03-13 DIAGNOSIS — T85528D Displacement of other gastrointestinal prosthetic devices, implants and grafts, subsequent encounter: Secondary | ICD-10-CM | POA: Insufficient documentation

## 2015-03-13 DIAGNOSIS — Z934 Other artificial openings of gastrointestinal tract status: Secondary | ICD-10-CM

## 2015-03-13 DIAGNOSIS — Y732 Prosthetic and other implants, materials and accessory gastroenterology and urology devices associated with adverse incidents: Secondary | ICD-10-CM | POA: Insufficient documentation

## 2015-03-13 DIAGNOSIS — T85521D Displacement of esophageal anti-reflux device, subsequent encounter: Secondary | ICD-10-CM

## 2015-03-13 DIAGNOSIS — IMO0002 Reserved for concepts with insufficient information to code with codable children: Secondary | ICD-10-CM

## 2015-03-13 MED ORDER — IOHEXOL 300 MG/ML  SOLN: 50.0000 mL | Freq: Once | INTRAMUSCULAR | Status: AC | PRN

## 2015-03-14 ENCOUNTER — Inpatient Hospital Stay (HOSPITAL_COMMUNITY): Admission: RE | Admit: 2015-03-14 | Payer: Self-pay | Source: Ambulatory Visit

## 2015-03-15 ENCOUNTER — Other Ambulatory Visit: Payer: Self-pay | Admitting: Internal Medicine

## 2015-03-15 ENCOUNTER — Other Ambulatory Visit: Payer: Self-pay | Admitting: Physician Assistant

## 2015-03-15 ENCOUNTER — Telehealth (HOSPITAL_COMMUNITY): Payer: Self-pay | Admitting: General Surgery

## 2015-03-15 NOTE — Telephone Encounter (Signed)
Called pt, left VM for him to call back to schedule appt in IR for his J tube check. JM

## 2015-05-18 ENCOUNTER — Telehealth: Payer: Self-pay | Admitting: Gastroenterology

## 2015-05-18 DIAGNOSIS — R1111 Vomiting without nausea: Secondary | ICD-10-CM

## 2015-05-18 NOTE — Telephone Encounter (Signed)
Ok to directly schedule this.  Thanks

## 2015-05-18 NOTE — Telephone Encounter (Signed)
Please arrive at [facility] on 05/25/15 at 930 am arrive 915 am and have nothing to eat or drink after midnight.  Pt has been instructed and will call with any further questions.

## 2015-05-18 NOTE — Telephone Encounter (Signed)
Dr [name] the pt vomiting has returned without any other symptoms.  Per Paula's note you recommended "If symptoms return, would arrange UGI with SBFT (at least through proximal small bowel) to get an idea of any anatomic issues now (following complex ulcer surgery recently)"  Do you want me to schedule this or have the pt come into the office.

## 2015-05-25 ENCOUNTER — Ambulatory Visit (HOSPITAL_COMMUNITY)
Admission: RE | Admit: 2015-05-25 | Discharge: 2015-05-25 | Disposition: A | Discharge status: Home / Self Care | Payer: Medicare Other | Source: Ambulatory Visit | Attending: Gastroenterology | Admitting: Gastroenterology

## 2015-05-25 DIAGNOSIS — K219 Gastro-esophageal reflux disease without esophagitis: Secondary | ICD-10-CM | POA: Diagnosis not present

## 2015-05-25 DIAGNOSIS — R1110 Vomiting, unspecified: Secondary | ICD-10-CM | POA: Diagnosis present

## 2015-05-25 DIAGNOSIS — R1111 Vomiting without nausea: Secondary | ICD-10-CM

## 2015-07-30 ENCOUNTER — Encounter (HOSPITAL_COMMUNITY): Payer: Self-pay | Admitting: Emergency Medicine

## 2015-07-30 ENCOUNTER — Emergency Department (HOSPITAL_COMMUNITY)
Admission: EM | Admit: 2015-07-30 | Discharge: 2015-07-31 | Disposition: A | Discharge status: Home / Self Care | Payer: Medicare Other | Attending: Emergency Medicine | Admitting: Emergency Medicine

## 2015-07-30 DIAGNOSIS — Z85118 Personal history of other malignant neoplasm of bronchus and lung: Secondary | ICD-10-CM | POA: Diagnosis not present

## 2015-07-30 DIAGNOSIS — E780 Pure hypercholesterolemia: Secondary | ICD-10-CM | POA: Diagnosis not present

## 2015-07-30 DIAGNOSIS — Z8719 Personal history of other diseases of the digestive system: Secondary | ICD-10-CM | POA: Diagnosis not present

## 2015-07-30 DIAGNOSIS — E119 Type 2 diabetes mellitus without complications: Secondary | ICD-10-CM | POA: Diagnosis not present

## 2015-07-30 DIAGNOSIS — Z9049 Acquired absence of other specified parts of digestive tract: Secondary | ICD-10-CM | POA: Insufficient documentation

## 2015-07-30 DIAGNOSIS — Z8679 Personal history of other diseases of the circulatory system: Secondary | ICD-10-CM | POA: Insufficient documentation

## 2015-07-30 DIAGNOSIS — K2970 Gastritis, unspecified, without bleeding: Secondary | ICD-10-CM | POA: Insufficient documentation

## 2015-07-30 DIAGNOSIS — Z7951 Long term (current) use of inhaled steroids: Secondary | ICD-10-CM | POA: Insufficient documentation

## 2015-07-30 DIAGNOSIS — Z862 Personal history of diseases of the blood and blood-forming organs and certain disorders involving the immune mechanism: Secondary | ICD-10-CM | POA: Insufficient documentation

## 2015-07-30 DIAGNOSIS — Z79899 Other long term (current) drug therapy: Secondary | ICD-10-CM | POA: Insufficient documentation

## 2015-07-30 DIAGNOSIS — J449 Chronic obstructive pulmonary disease, unspecified: Secondary | ICD-10-CM | POA: Insufficient documentation

## 2015-07-30 DIAGNOSIS — R109 Unspecified abdominal pain: Secondary | ICD-10-CM | POA: Diagnosis present

## 2015-07-30 DIAGNOSIS — Z87891 Personal history of nicotine dependence: Secondary | ICD-10-CM | POA: Diagnosis not present

## 2015-07-30 DIAGNOSIS — Z85820 Personal history of malignant melanoma of skin: Secondary | ICD-10-CM | POA: Insufficient documentation

## 2015-07-30 DIAGNOSIS — Z8619 Personal history of other infectious and parasitic diseases: Secondary | ICD-10-CM | POA: Diagnosis not present

## 2015-07-30 DIAGNOSIS — Z8701 Personal history of pneumonia (recurrent): Secondary | ICD-10-CM | POA: Insufficient documentation

## 2015-07-30 LAB — COMPREHENSIVE METABOLIC PANEL
ALBUMIN: 3.7 g/dL (ref 3.5–5.0)
ALK PHOS: 67 U/L (ref 38–126)
ALT: 17 U/L (ref 17–63)
AST: 20 U/L (ref 15–41)
Anion gap: 9 (ref 5–15)
BUN: 9 mg/dL (ref 6–20)
CALCIUM: 9.2 mg/dL (ref 8.9–10.3)
CHLORIDE: 105 mmol/L (ref 101–111)
CO2: 26 mmol/L (ref 22–32)
CREATININE: 0.83 mg/dL (ref 0.61–1.24)
GFR calc non Af Amer: >60 mL/min (ref >60)
GLUCOSE: 182 mg/dL — AB (ref 65–99)
Potassium: 3.9 mmol/L (ref 3.5–5.1)
SODIUM: 140 mmol/L (ref 135–145)
Total Bilirubin: 0.7 mg/dL (ref 0.3–1.2)
Total Protein: 6.8 g/dL (ref 6.5–8.1)

## 2015-07-30 LAB — CBC
HCT: 35.3 % — ABNORMAL LOW (ref 39.0–52.0)
Hemoglobin: 11.8 g/dL — ABNORMAL LOW (ref 13.0–17.0)
MCH: 29.6 pg (ref 26.0–34.0)
MCHC: 33.4 g/dL (ref 30.0–36.0)
MCV: 88.7 fL (ref 78.0–100.0)
PLATELETS: 136 10*3/uL — AB (ref 150–400)
RBC: 3.98 MIL/uL — ABNORMAL LOW (ref 4.22–5.81)
RDW: 13.5 % (ref 11.5–15.5)
WBC: 6.8 10*3/uL (ref 4.0–10.5)

## 2015-07-30 LAB — LIPASE, BLOOD: LIPASE: 20 U/L — AB (ref 22–51)

## 2015-07-30 MED ORDER — ONDANSETRON HCL 4 MG/2ML IJ SOLN
4.0000 mg | Freq: Once | INTRAMUSCULAR | Status: AC | PRN
  Administered 2015-07-31: 4 mg via INTRAVENOUS
  Filled 2015-07-30: qty 2

## 2015-07-30 NOTE — ED Notes (Signed)
Pt c/o nausea and emesis since this past Sunday. Still c/o generalized abdominal pain with nausea. Chemotherapy treatments stopped this past December d/t stomach ulcers. Denies fevers at home. Denies dysuria. Denies hematuria or blood in stool. No other c/c.

## 2015-07-31 ENCOUNTER — Encounter (HOSPITAL_COMMUNITY): Payer: Self-pay

## 2015-07-31 ENCOUNTER — Emergency Department (HOSPITAL_COMMUNITY): Payer: Medicare Other

## 2015-07-31 MED ORDER — LACTATED RINGERS IV BOLUS (SEPSIS)
1000.0000 mL | Freq: Once | INTRAVENOUS | Status: AC
  Administered 2015-07-31: 1000 mL via INTRAVENOUS

## 2015-07-31 MED ORDER — PANTOPRAZOLE SODIUM 40 MG IV SOLR
40.0000 mg | Freq: Once | INTRAVENOUS | Status: AC
Start: 2015-07-31 — End: 2015-07-31
  Administered 2015-07-31: 40 mg via INTRAVENOUS
  Filled 2015-07-31: qty 40

## 2015-07-31 MED ORDER — IOHEXOL 300 MG/ML  SOLN
50.0000 mL | Freq: Once | INTRAMUSCULAR | Status: AC | PRN
  Administered 2015-07-31: 50 mL via ORAL

## 2015-07-31 MED ORDER — IOHEXOL 300 MG/ML  SOLN
100.0000 mL | Freq: Once | INTRAMUSCULAR | Status: AC | PRN
  Administered 2015-07-31: 100 mL via INTRAVENOUS

## 2015-07-31 MED ORDER — HYDROMORPHONE HCL 1 MG/ML IJ SOLN
1.0000 mg | Freq: Once | INTRAMUSCULAR | Status: AC
  Administered 2015-07-31: 1 mg via INTRAVENOUS
  Filled 2015-07-31: qty 1

## 2015-07-31 NOTE — ED Provider Notes (Signed)
CSN: [id number]   Arrival date & time 07/30/15 2012  History  This chart was scribed for Daryl Biles, MD by Altamease Oiler, ED Scribe. This patient was seen in room WA17/WA17 and the patient's care was started at 12:47 AM.  Chief Complaint  Patient presents with  . Emesis  . Nausea  . Abdominal Pain    HPI The history is provided by the patient and the spouse. No language interpreter was used.   [name] is a 69 y.o. male with PMHx of lung cancer, duodenal ulcer, gastroparesis, and DM who presents to the Emergency Department complaining of worsening periumbilical and left sided abdominal pain since yesterday. He describes the pain as "stuffy like I might explode". Pepto Bismol provided insufficient pain relief PTA.  Associated symptoms include abdominal bloating, nausea, 2 episodes of emesis yesterday, belching, and flatuance. Pt denies diarrhea and, dark stool,  hematochezia. Past surgical history includes EGD, surgery to repair a bleeding ulcer, pyloroplasty, and cholecystectomy. He is not currently receiving chemotherapy.   Past Medical History  Diagnosis Date  . DM (diabetes mellitus)   . Pneumonia   . Hypercholesteremia   . Melanoma   . Radiation 08/03/14-08/23/14    35 gray to right chest  . Lung cancer   . FH: chemotherapy   . Gastroparesis 02/09/2015  . Cholecystoduodenal fistula   . Pulmonary embolus   . Duodenal ulcer   . COPD (chronic obstructive pulmonary disease)   . Protein calorie malnutrition   . Elevated LFTs   . C. difficile enteritis   . Anemia     Past Surgical History  Procedure Laterality Date  . Video bronchoscopy Bilateral 07/20/2014    Procedure: VIDEO BRONCHOSCOPY WITHOUT FLUORO;  Surgeon: Tanda Rockers, MD;  Location: [facility];  Service: Cardiopulmonary;  Laterality: Bilateral;  . Esophagogastroduodenoscopy N/A 11/30/2014    Procedure: ESOPHAGOGASTRODUODENOSCOPY (EGD);  Surgeon: [name], MD;  Location: [facility];  Service:  Endoscopy;  Laterality: N/A;  . Laparotomy N/A 11/30/2014    Procedure: EXPLORATORY LAPAROTOMY, PYLOROPLASTY, OVERSEWING OF POSTERIOR DUODENUM, DUODENOSTOMY, CHOLECYSTECTOMY, JEJEUNOSTOMY;  Surgeon: Jackolyn Confer, MD;  Location: [facility];  Service: General;  Laterality: N/A;  . Pyloroplasty    . Cholecystectomy    . Duodenostomy tube    . Jejunostomy feeding tube      Family History  Problem Relation Age of Onset  . Heart disease Mother   . Cancer Mother     ? type    Social History  Substance Use Topics  . Smoking status: Former Smoker -- 2.00 packs/day for 50 years    Types: Cigarettes, Cigars    Quit date: 06/12/2014  . Smokeless tobacco: Current User    Types: Chew  . Alcohol Use: No     Review of Systems  Gastrointestinal: Positive for nausea, vomiting, abdominal pain and abdominal distention. Negative for diarrhea and blood in stool.       Belching  Flatulence      Home Medications   Prior to Admission medications   Medication Sig Start Date End Date Taking? Authorizing Provider  atorvastatin (LIPITOR) 80 MG tablet Take 80 mg by mouth at bedtime.    Yes Historical Provider, MD  budesonide-formoterol (SYMBICORT) 160-4.5 MCG/ACT inhaler Take 2 puffs first thing in am and then another 2 puffs about 12 hours later. Patient taking differently: Inhale 2 puffs into the lungs every 12 (twelve) hours as needed.  01/11/15  Yes Venetia Maxon Rama, MD  calcium citrate-vitamin D (CITRACAL+D)  315-200 MG-UNIT per tablet Take 1 tablet by mouth at bedtime.    Yes Historical Provider, MD  Dapagliflozin Propanediol (FARXIGA) 5 MG TABS Take 5 mg by mouth 2 (two) times daily.   Yes Historical Provider, MD  DULoxetine (CYMBALTA) 20 MG capsule Take 1 capsule (20 mg total) by mouth daily. 01/11/15  Yes Venetia Maxon Rama, MD  fentaNYL (DURAGESIC - DOSED MCG/HR) 25 MCG/HR patch Place 25 mcg onto the skin every 3 (three) days.   Yes Historical Provider, MD  glimepiride (AMARYL) 4 MG tablet Take 4 mg  by mouth at bedtime. Take before meals   Yes Historical Provider, MD  HYDROmorphone (DILAUDID) 4 MG tablet Take 4 mg by mouth every 8 (eight) hours as needed for severe pain.   Yes Historical Provider, MD  LORazepam (ATIVAN) 0.5 MG tablet Take 1 table by mouth every 12 hours as needed for anxiety 12/19/14  Yes Nishant Dhungel, MD  metFORMIN (GLUCOPHAGE) 500 MG tablet Take 500 mg by mouth 2 (two) times daily with a meal.   Yes Historical Provider, MD  morphine (MSIR) 15 MG tablet Take 1 tablet (15 mg total) by mouth every 6 (six) hours as needed for severe pain. 01/31/15  Yes Nat Christen, PA-C  Multiple Vitamins-Iron (MULTIVITAMINS WITH IRON) TABS tablet Take 1 tablet by mouth daily after supper. 12/19/14  Yes Nishant Dhungel, MD  pantoprazole (PROTONIX) 40 MG tablet Take 1 tablet (40 mg total) by mouth 2 (two) times daily. 12/19/14  Yes Nishant Dhungel, MD  potassium chloride SA (K-DUR,KLOR-CON) 20 MEQ tablet Take 2 tablets (40 mEq total) by mouth daily. 12/19/14  Yes Nishant Dhungel, MD  Amino Acids-Protein Hydrolys (FEEDING SUPPLEMENT, PRO-STAT SUGAR FREE 64,) LIQD Place 60 mLs into feeding tube 2 (two) times daily. Patient not taking: Reported on 03/12/2015 01/31/15   Nat Christen, PA-C  bismuth subsalicylate (PEPTO BISMOL) 262 MG/15ML suspension Place 30 mLs into feeding tube every 4 (four) hours as needed for indigestion. 01/11/15   Christina P Rama, MD  enoxaparin (LOVENOX) 100 MG/ML injection Inject 1 mL (100 mg total) into the skin daily. Patient not taking: Reported on 07/31/2015 01/11/15   Venetia Maxon Rama, MD  feeding supplement, RESOURCE BREEZE, (RESOURCE BREEZE) LIQD Take 1 Container by mouth 2 (two) times daily between meals. Patient not taking: Reported on 03/12/2015 01/31/15   Nat Christen, PA-C  Nutritional Supplements (FEEDING SUPPLEMENT, OSMOLITE 1.5 CAL,) LIQD Place 1,000 mLs into feeding tube continuous. Continue Osmolite 1.5 @ 75 ml/hr over 12 hours (8pm-8am) Patient not taking: Reported on  07/31/2015 01/31/15   Nat Christen, PA-C  prochlorperazine (COMPAZINE) 10 MG tablet Take 1 tablet (10 mg total) by mouth every 6 (six) hours as needed for nausea or vomiting. 02/09/15   Earnstine Regal, PA-C  promethazine (PHENERGAN) 25 MG suppository Place 1 suppository (25 mg total) rectally every 6 (six) hours as needed for nausea or vomiting. 02/21/15   Willia Craze, NP  saccharomyces boulardii (FLORASTOR) 250 MG capsule Take 1 capsule (250 mg total) by mouth 2 (two) times daily. Patient not taking: Reported on 07/31/2015 12/19/14   Nishant Dhungel, MD  temazepam (RESTORIL) 15 MG capsule Take 1 capsule (15 mg total) by mouth at bedtime as needed for sleep. Patient not taking: Reported on 07/31/2015 11/01/14   Curt Bears, MD    Allergies  Review of patient's allergies indicates no known allergies.  Triage Vitals: BP 103/76 mmHg  Pulse 104  Temp(Src) 97.3 F (36.3 C) (Oral)  Resp 16  SpO2 100%  Physical Exam  Constitutional: He is oriented to person, place, and time. He appears well-developed and well-nourished.  HENT:  Head: Normocephalic.  Eyes: EOM are normal.  Neck: Normal range of motion.  Pulmonary/Chest: Effort normal.  Abdominal: Soft. Bowel sounds are normal. He exhibits distension (Mildly). There is tenderness. There is no rebound and no guarding.  Musculoskeletal: Normal range of motion.  Neurological: He is alert and oriented to person, place, and time.  Psychiatric: He has a normal mood and affect.  Nursing note and vitals reviewed.   ED Course  Procedures   DIAGNOSTIC STUDIES: Oxygen Saturation is 100% on RA, normal by my interpretation.    COORDINATION OF CARE: 12:55 AM Discussed treatment plan which includes lab work and Zofran with pt at bedside and pt agreed to plan.  I, Daryl Biles, MD, personally reviewed and evaluated these images and lab results as part of my medical decision-making.   Labs Review-  Labs Reviewed  LIPASE, BLOOD - Abnormal;  Notable for the following:    Lipase 20 (*)    All other components within normal limits  COMPREHENSIVE METABOLIC PANEL - Abnormal; Notable for the following:    Glucose, Bld 182 (*)    All other components within normal limits  CBC - Abnormal; Notable for the following:    RBC 3.98 (*)    Hemoglobin 11.8 (*)    HCT 35.3 (*)    Platelets 136 (*)    All other components within normal limits  URINALYSIS, ROUTINE W REFLEX MICROSCOPIC (NOT AT [facility])    Imaging Review Ct Abdomen Pelvis W Contrast  07/31/2015   CLINICAL DATA:  Abdominal pain. History of lung cancer and melanoma.  EXAM: CT ABDOMEN AND PELVIS WITH CONTRAST  TECHNIQUE: Multidetector CT imaging of the abdomen and pelvis was performed using the standard protocol following bolus administration of intravenous contrast.  CONTRAST:  166m OMNIPAQUE IOHEXOL 300 MG/ML  SOLN  COMPARISON:  12/11/2014  FINDINGS: BODY WALL: Abdominal wall scarring and left larger than right fatty inguinal hernias.  LOWER CHEST: Small right pleural effusion. Trace left pleural effusion. Mild lower lobe scarring or atelectasis.  ABDOMEN/PELVIS:  Liver: No focal abnormality.  Biliary: Cholecystectomy.  No bile duct enlargement.  Pancreas: Unremarkable.  Spleen: Unremarkable.  Adrenals: Stable 18 mm adenoma in the right gland.  Kidneys and ureters: Bilateral renal cysts and cortical low densities too small to characterize. 2 Nonobstructive right calculi measuring 2-3 mm. No hydronephrosis or ureteral calculus.  Bladder: Unremarkable.  Reproductive: Hysterectomy.  No adnexal mass.  Bowel: Extensive colonic diverticulosis. No bowel obstruction or inflammatory change. No appendicitis.  Retroperitoneum: No mass or adenopathy.  Peritoneum: No ascites or pneumoperitoneum.  Vascular: Occlusion or narrowing of the left external iliac vein peripherally. No acute finding.  OSSEOUS: No acute abnormalities.  IMPRESSION: 1. No explanation for acute abdominal pain. 2. Nonobstructive right  nephrolithiasis. 3. Extensive colonic diverticulosis.   Electronically Signed   By: JMonte FantasiaM.D.   On: 07/31/2015 05:21    EKG Interpretation None    7:08 AM Imaging is neg. Results discussed. Suspect pain is gastritis. Pt has a GI f/u. Will d.c.  MDM   Final diagnoses:  Gastritis    I personally performed the services described in this documentation, which was scribed in my presence. The recorded information has been reviewed and is accurate.  Pt comes in with cc of abd pain. Hx of lung CA, hx of abdominal surgery and chronic  gastritis. Patient has strong pain meds at home. Comes in with abd distention, nausea and emesis. Abd exam is not peritoneal, there is mild tenderness only - but CT ordered given hx of cancer to ensure there are no mets.    Daryl Biles, MD 07/31/15 [phone]

## 2015-07-31 NOTE — Discharge Instructions (Signed)
We saw you in the ER for the abdominal pain. All of our results are normal, including all labs and imaging. Kidney function is fine as well. We are not sure what is causing your abdominal pain - but suspect that you have gastritis, and recommend that you see your primary care doctor within 2-3 days for further evaluation. If your symptoms get worse, return to the ER. Take the pain meds and nausea meds as prescribed.   Gastritis, Adult Gastritis is soreness and swelling (inflammation) of the lining of the stomach. Gastritis can develop as a sudden onset (acute) or long-term (chronic) condition. If gastritis is not treated, it can lead to stomach bleeding and ulcers. CAUSES  Gastritis occurs when the stomach lining is weak or damaged. Digestive juices from the stomach then inflame the weakened stomach lining. The stomach lining may be weak or damaged due to viral or bacterial infections. One common bacterial infection is the Helicobacter pylori infection. Gastritis can also result from excessive alcohol consumption, taking certain medicines, or having too much acid in the stomach.  SYMPTOMS  In some cases, there are no symptoms. When symptoms are present, they may include:  Pain or a burning sensation in the upper abdomen.  Nausea.  Vomiting.  An uncomfortable feeling of fullness after eating. DIAGNOSIS  Your caregiver may suspect you have gastritis based on your symptoms and a physical exam. To determine the cause of your gastritis, your caregiver may perform the following:  Blood or stool tests to check for the H pylori bacterium.  Gastroscopy. A thin, flexible tube (endoscope) is passed down the esophagus and into the stomach. The endoscope has a light and camera on the end. Your caregiver uses the endoscope to view the inside of the stomach.  Taking a tissue sample (biopsy) from the stomach to examine under a microscope. TREATMENT  Depending on the cause of your gastritis, medicines  may be prescribed. If you have a bacterial infection, such as an H pylori infection, antibiotics may be given. If your gastritis is caused by too much acid in the stomach, H2 blockers or antacids may be given. Your caregiver may recommend that you stop taking aspirin, ibuprofen, or other nonsteroidal anti-inflammatory drugs (NSAIDs). HOME CARE INSTRUCTIONS  Only take over-the-counter or prescription medicines as directed by your caregiver.  If you were given antibiotic medicines, take them as directed. Finish them even if you start to feel better.  Drink enough fluids to keep your urine clear or pale yellow.  Avoid foods and drinks that make your symptoms worse, such as:  Caffeine or alcoholic drinks.  Chocolate.  Peppermint or mint flavorings.  Garlic and onions.  Spicy foods.  Citrus fruits, such as oranges, lemons, or limes.  Tomato-based foods such as sauce, chili, salsa, and pizza.  Fried and fatty foods.  Eat small, frequent meals instead of large meals. SEEK IMMEDIATE MEDICAL CARE IF:   You have black or dark red stools.  You vomit blood or material that looks like coffee grounds.  You are unable to keep fluids down.  Your abdominal pain gets worse.  You have a fever.  You do not feel better after 1 week.  You have any other questions or concerns. MAKE SURE YOU:  Understand these instructions.  Will watch your condition.  Will get help right away if you are not doing well or get worse. Document Released: 11/25/2001 Document Revised: 06/01/2012 Document Reviewed: 01/14/2012 [facility] Patient Information 2015 [place], [place]. This information is not intended  to replace advice given to you by your health care provider. Make sure you discuss any questions you have with your health care provider.

## 2015-08-03 ENCOUNTER — Ambulatory Visit (INDEPENDENT_AMBULATORY_CARE_PROVIDER_SITE_OTHER): Payer: Medicare Other | Admitting: Gastroenterology

## 2015-08-03 ENCOUNTER — Encounter: Payer: Self-pay | Admitting: Gastroenterology

## 2015-08-03 ENCOUNTER — Other Ambulatory Visit: Payer: Self-pay | Admitting: Gastroenterology

## 2015-08-03 VITALS — BP 102/70 | HR 100 | Ht 67.0 in | Wt 168.5 lb

## 2015-08-03 DIAGNOSIS — Z8711 Personal history of peptic ulcer disease: Secondary | ICD-10-CM

## 2015-08-03 DIAGNOSIS — K5900 Constipation, unspecified: Secondary | ICD-10-CM | POA: Diagnosis not present

## 2015-08-03 DIAGNOSIS — R109 Unspecified abdominal pain: Secondary | ICD-10-CM

## 2015-08-03 DIAGNOSIS — R1110 Vomiting, unspecified: Secondary | ICD-10-CM | POA: Diagnosis not present

## 2015-08-03 MED ORDER — NA SULFATE-K SULFATE-MG SULF 17.5-3.13-1.6 GM/177ML PO SOLN: ORAL | Status: DC

## 2015-08-03 NOTE — Patient Instructions (Addendum)
Please start ranitidine '150mg'$  pill, one pill at bedtime every night. (over the counter) You will be set up for a colonoscopy for constipation, abdominal pain. You will be set up for an upper endoscopy for intermittent vomiting, history of PUD. Please start taking citrucel (orange flavored) powder fiber supplement.  This may cause some bloating at first but that usually goes away. Begin with a small spoonful and work your way up to a large, heaping spoonful daily over a week.  You have been scheduled for an endoscopy and colonoscopy. Please follow the written instructions given to you at your visit today. Please pick up your prep supplies at the pharmacy within the next 1-3 days. If you use inhalers (even only as needed), please bring them with you on the day of your procedure. Your physician has requested that you go to www.startemmi.com and enter the access code given to you at your visit today. This web site gives a general overview about your procedure. However, you should still follow specific instructions given to you by our office regarding your preparation for the procedure.

## 2015-08-03 NOTE — Progress Notes (Signed)
Review of pertinent gastrointestinal problems: 1. Uncontrollably bleeding duodenal ulcer: EGD Dr. [name] December 2015 several small, clean-based ulcers in the distal stomach, pan- gastritis and a large duodenal ulcer with a nonbleeding visible vessel which was injected and treated with heater probe. Cautery resulted in a significant pulsatile bleeding from the vessel, patient was taken emergently to the operating room [name] . He underwent an exploratory laparotomy with oversewing of a posterior bleeding duodenal ulcer, pyloroplasty, cholecystectomy, repair of right hepatic artery, repair of incidental cholecystoduodenal fistula and placement of a duodenostomy tube and feeding jejunostomy.  HPI: This is a  very pleasant 69 year old man whom I last saw several months ago he was still hospitalized for bleeding ulcer. He is here with his wife today.  Chief complaint is abdominal pain, constipation, bad taste in mouth and morning  Was in ER earlier this week with lower abd pains, workup including labs (essentially normal, films below  Tends to be constipated, moves 2-3 times a week.  Has to strain a lot.  Vomiting improving but he still vomits 2-3 times per week, sounds high volume per his wife.  He takes narcotic pain medicines for his hand pains, 1-2 pills daily.  He is very bothered by bad taste, sour taste in his mouth every morning when he wakes up. His wife put the head of his bed on blocks.   UGI 05/2015 for intermittent vomiting: 1. Severe gastroesophageal reflux. Tertiary contractions of theesophagus as can be seen with esophageal spasm versuspresbyesophagus, but spasm is favored given the severegastroesophageal reflux. 2. Poor gastric coating limiting the evaluation of the stomach. Poor gastric coating can be seen with increased gastric secretions due to gastritis. There is no gross gastric ulceration or perforation. There is no delay in gastric emptying.  CT scan 07/2015 for ER  visit, left sided abd pain:1. No explanation for acute abdominal pain.2. Nonobstructive right nephrolithiasis.3. Extensive colonic diverticulosis.   Past Medical History  Diagnosis Date  . DM (diabetes mellitus)   . Pneumonia   . Hypercholesteremia   . Melanoma   . Radiation 08/03/14-08/23/14    35 gray to right chest  . Lung cancer   . FH: chemotherapy   . Gastroparesis 02/09/2015  . Cholecystoduodenal fistula   . Pulmonary embolus   . Duodenal ulcer   . COPD (chronic obstructive pulmonary disease)   . Protein calorie malnutrition   . Elevated LFTs   . C. difficile enteritis   . Anemia     Past Surgical History  Procedure Laterality Date  . Video bronchoscopy Bilateral 07/20/2014    Procedure: VIDEO BRONCHOSCOPY WITHOUT FLUORO;  Surgeon: Tanda Rockers, MD;  Location: [facility];  Service: Cardiopulmonary;  Laterality: Bilateral;  . Esophagogastroduodenoscopy N/A 11/30/2014    Procedure: ESOPHAGOGASTRODUODENOSCOPY (EGD);  Surgeon: [name], MD;  Location: [facility];  Service: Endoscopy;  Laterality: N/A;  . Laparotomy N/A 11/30/2014    Procedure: EXPLORATORY LAPAROTOMY, PYLOROPLASTY, OVERSEWING OF POSTERIOR DUODENUM, DUODENOSTOMY, CHOLECYSTECTOMY, JEJEUNOSTOMY;  Surgeon: Jackolyn Confer, MD;  Location: [facility];  Service: General;  Laterality: N/A;  . Pyloroplasty    . Cholecystectomy    . Duodenostomy tube    . Jejunostomy feeding tube      Current Outpatient Prescriptions  Medication Sig Dispense Refill  . atorvastatin (LIPITOR) 80 MG tablet Take 80 mg by mouth at bedtime.     . bismuth subsalicylate (PEPTO BISMOL) 262 MG/15ML suspension Place 30 mLs into feeding tube every 4 (four) hours as needed for indigestion. Kimberly  mL 0  . budesonide-formoterol (SYMBICORT) 160-4.5 MCG/ACT inhaler Take 2 puffs first thing in am and then another 2 puffs about 12 hours later. (Patient taking differently: Inhale 2 puffs into the lungs every 12 (twelve) hours as needed. ) 1 Inhaler 3   . calcium citrate-vitamin D (CITRACAL+D) 315-200 MG-UNIT per tablet Take 1 tablet by mouth at bedtime.     . Dapagliflozin Propanediol (FARXIGA) 5 MG TABS Take 5 mg by mouth 2 (two) times daily.    . DULoxetine (CYMBALTA) 20 MG capsule Take 1 capsule (20 mg total) by mouth daily. 30 capsule 3  . fentaNYL (DURAGESIC - DOSED MCG/HR) 75 MCG/HR Place 1 patch onto the skin every 3 (three) days.    [name] glimepiride (AMARYL) 4 MG tablet Take 4 mg by mouth at bedtime. Take before meals    . HYDROmorphone (DILAUDID) 4 MG tablet Take 4 mg by mouth every 8 (eight) hours as needed for severe pain.    [name] LORazepam (ATIVAN) 0.5 MG tablet Take 1 table by mouth every 12 hours as needed for anxiety 30 tablet 0  . metFORMIN (GLUCOPHAGE) 500 MG tablet Take 500 mg by mouth 2 (two) times daily with a meal.    . Multiple Vitamins-Iron (MULTIVITAMINS WITH IRON) TABS tablet Take 1 tablet by mouth daily after supper. 30 tablet 0  . pantoprazole (PROTONIX) 40 MG tablet Take 1 tablet (40 mg total) by mouth 2 (two) times daily. 60 tablet 0  . potassium chloride SA (K-DUR,KLOR-CON) 20 MEQ tablet Take 2 tablets (40 mEq total) by mouth daily. 30 tablet 0  . prochlorperazine (COMPAZINE) 10 MG tablet Take 1 tablet (10 mg total) by mouth every 6 (six) hours as needed for nausea or vomiting. 60 tablet 2   No current facility-administered medications for this visit.    Allergies as of 08/03/2015  . (No Known Allergies)    Family History  Problem Relation Age of Onset  . Heart disease Mother   . Cancer Mother     ? type    Social History   Social History  . Marital Status: Married    Spouse Name: N/A  . Number of Children: N/A  . Years of Education: N/A   Occupational History  . Truck Geophysicist/field seismologist     Social History Main Topics  . Smoking status: Former Smoker -- 2.00 packs/day for 50 years    Types: Cigarettes, Cigars    Quit date: 06/12/2014  . Smokeless tobacco: Current User    Types: Chew  . Alcohol Use: No  .  Drug Use: No  . Sexual Activity: No   Other Topics Concern  . Not on file   Social History Narrative     Physical Exam: BP 102/70 mmHg  Pulse 100  Ht '5\' 7"'$  (1.702 m)  Wt 168 lb 8 oz (76.431 kg)  BMI 26.38 kg/m2 Constitutional: generally well-appearing Psychiatric: alert and oriented x3 Abdomen: soft, nontender, nondistended, no obvious ascites, no peritoneal signs, normal bowel sounds   Assessment and plan: 69 y.o. male with GERD, history of peptic ulcer disease, constipation, abdominal pain  He is taking proton pump inhibitor twice daily at good dosing, good timing. He will add H2 blocker at bedtime every night. With his intermittent vomiting I would like to look at his upper GI tract again, check for persistent peptic ulcer disease, significant esophagitis from his obvious GERD. For his constipation is lower abdominal pains I think these are probably functional, possibly related to his narcotics. He  has never had colonoscopy I would like to proceed with that now to rule out structural causes as well as screening for colon cancer, colon polyps. In the meantime he'll start fiber supplements on a daily basis.   Owens Loffler, MD [facility] 08/03/2015, 9:50 AM

## 2015-09-07 ENCOUNTER — Other Ambulatory Visit: Payer: Self-pay | Admitting: Anesthesiology

## 2015-09-07 ENCOUNTER — Ambulatory Visit
Admission: RE | Admit: 2015-09-07 | Discharge: 2015-09-07 | Disposition: A | Discharge status: Home / Self Care | Payer: Medicare Other | Source: Ambulatory Visit | Attending: Anesthesiology | Admitting: Anesthesiology

## 2015-09-07 DIAGNOSIS — M79641 Pain in right hand: Secondary | ICD-10-CM

## 2015-09-07 DIAGNOSIS — M79642 Pain in left hand: Principal | ICD-10-CM

## 2015-09-07 IMAGING — CR DG HAND COMPLETE 3+V*R*
3 series · 3 of 3 positions shown · non-contrast
Comparison: None.

CLINICAL DATA: Worsening bilateral hand pain for weeks. No known
injury.

EXAM:
RIGHT HAND - COMPLETE 3+ VIEW

[x hand pa right]
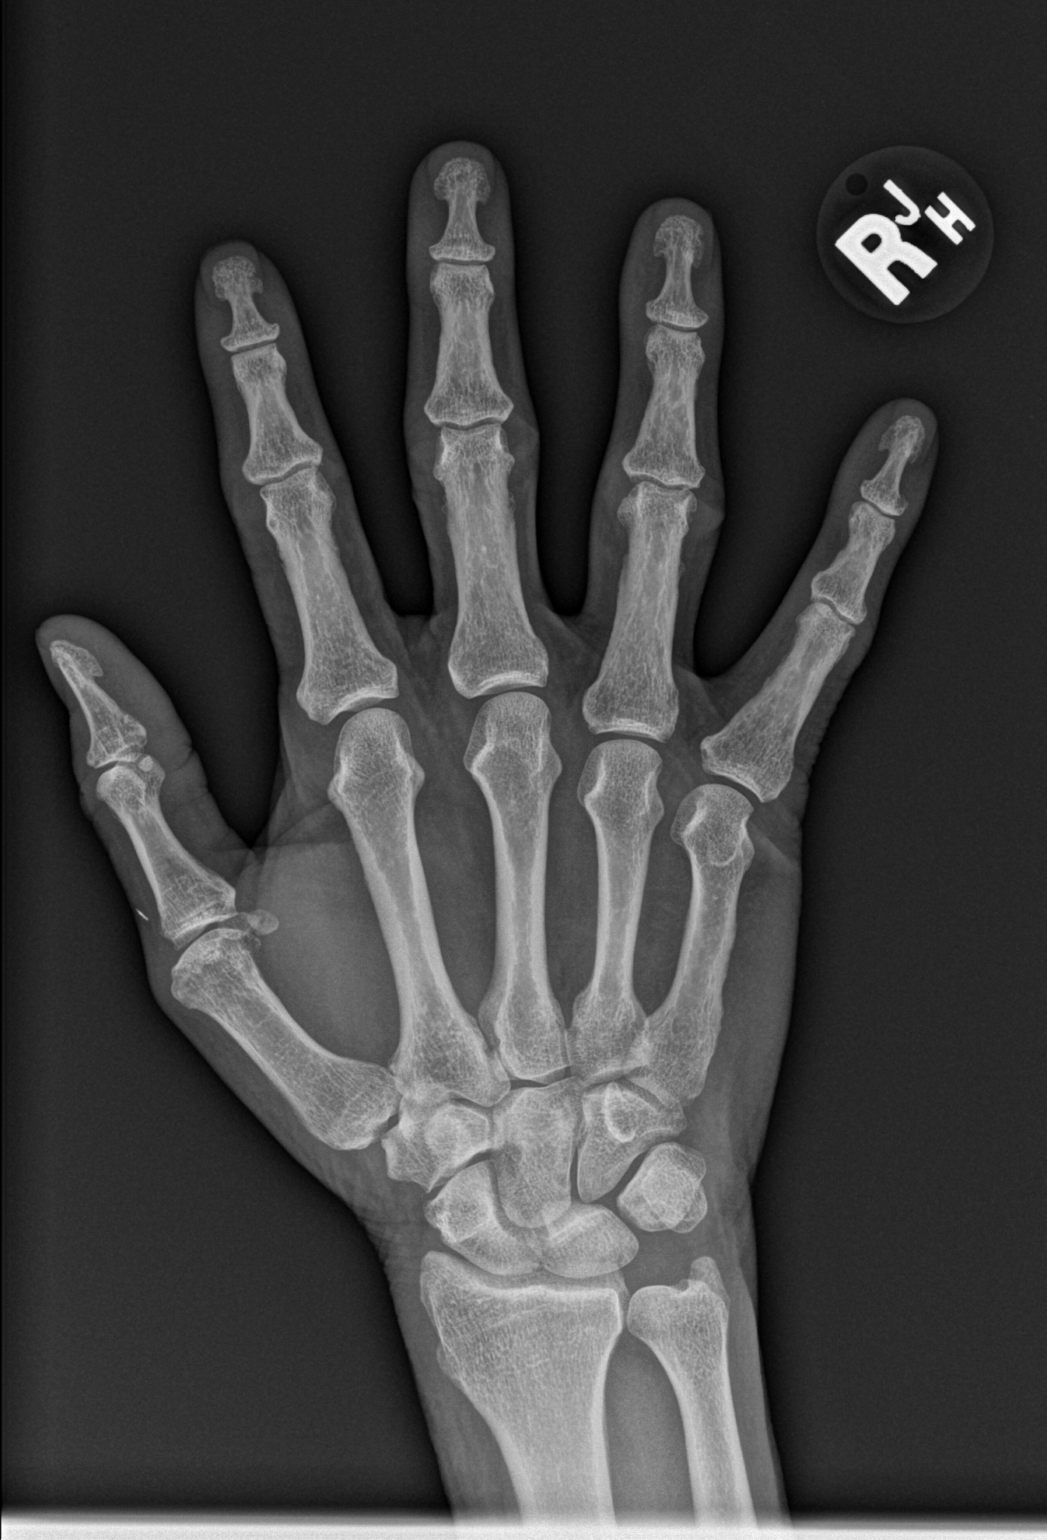

[x hand obl right]
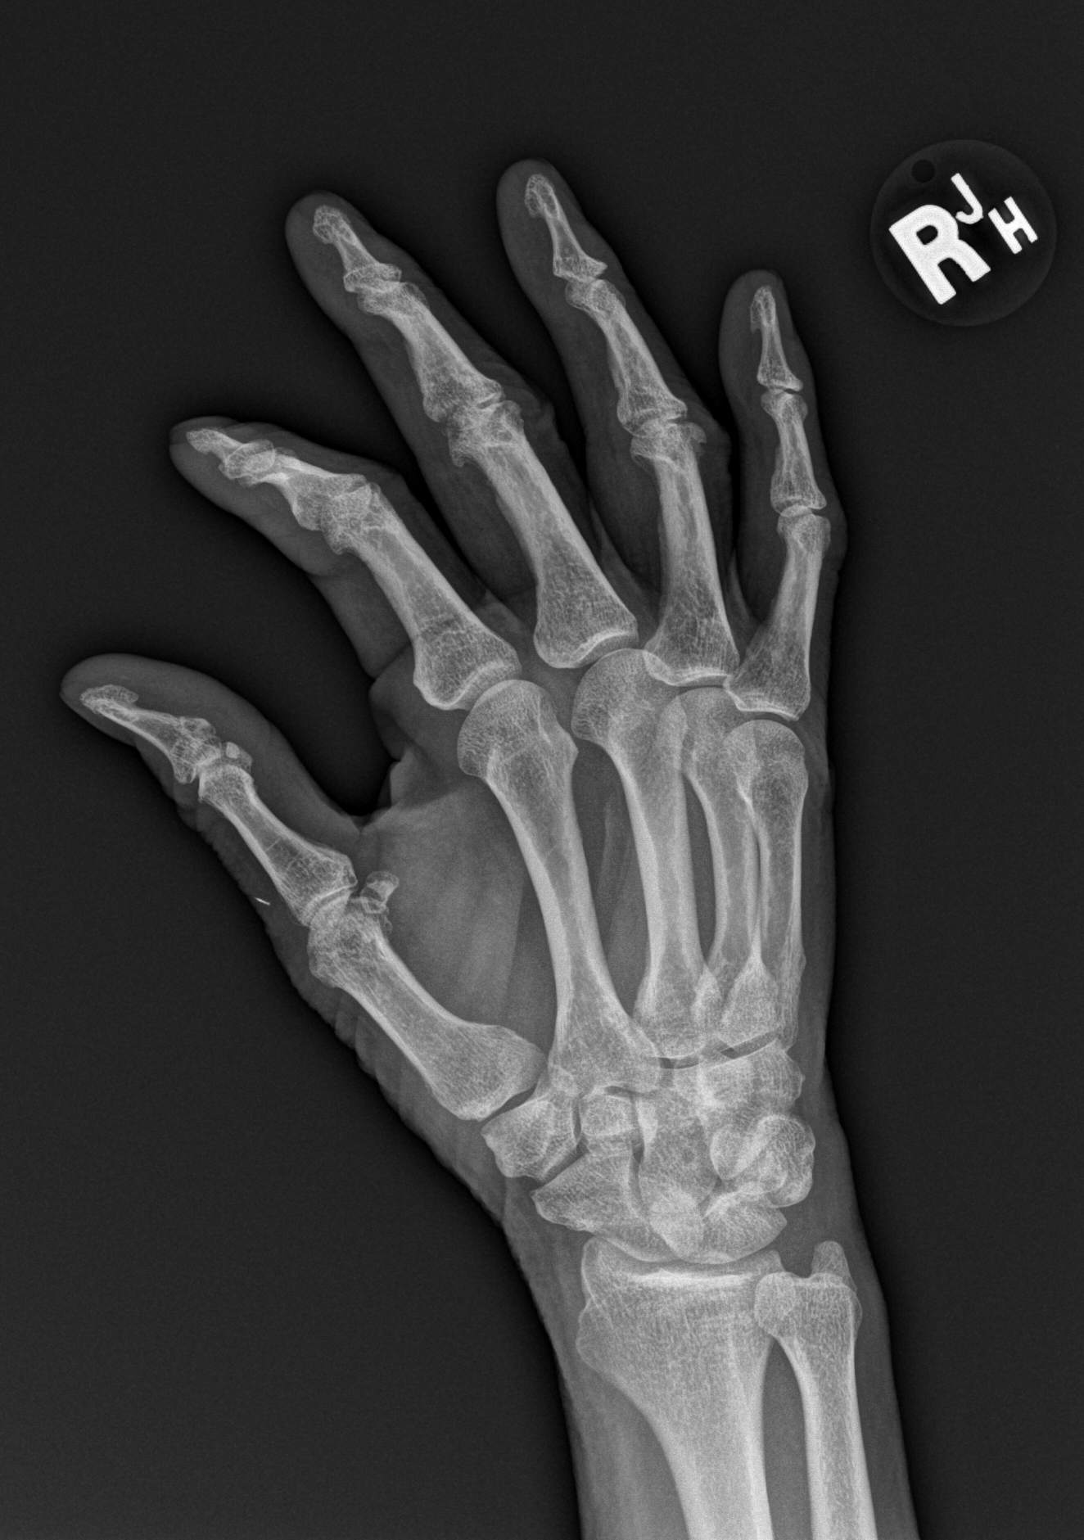

[x hand lat right]
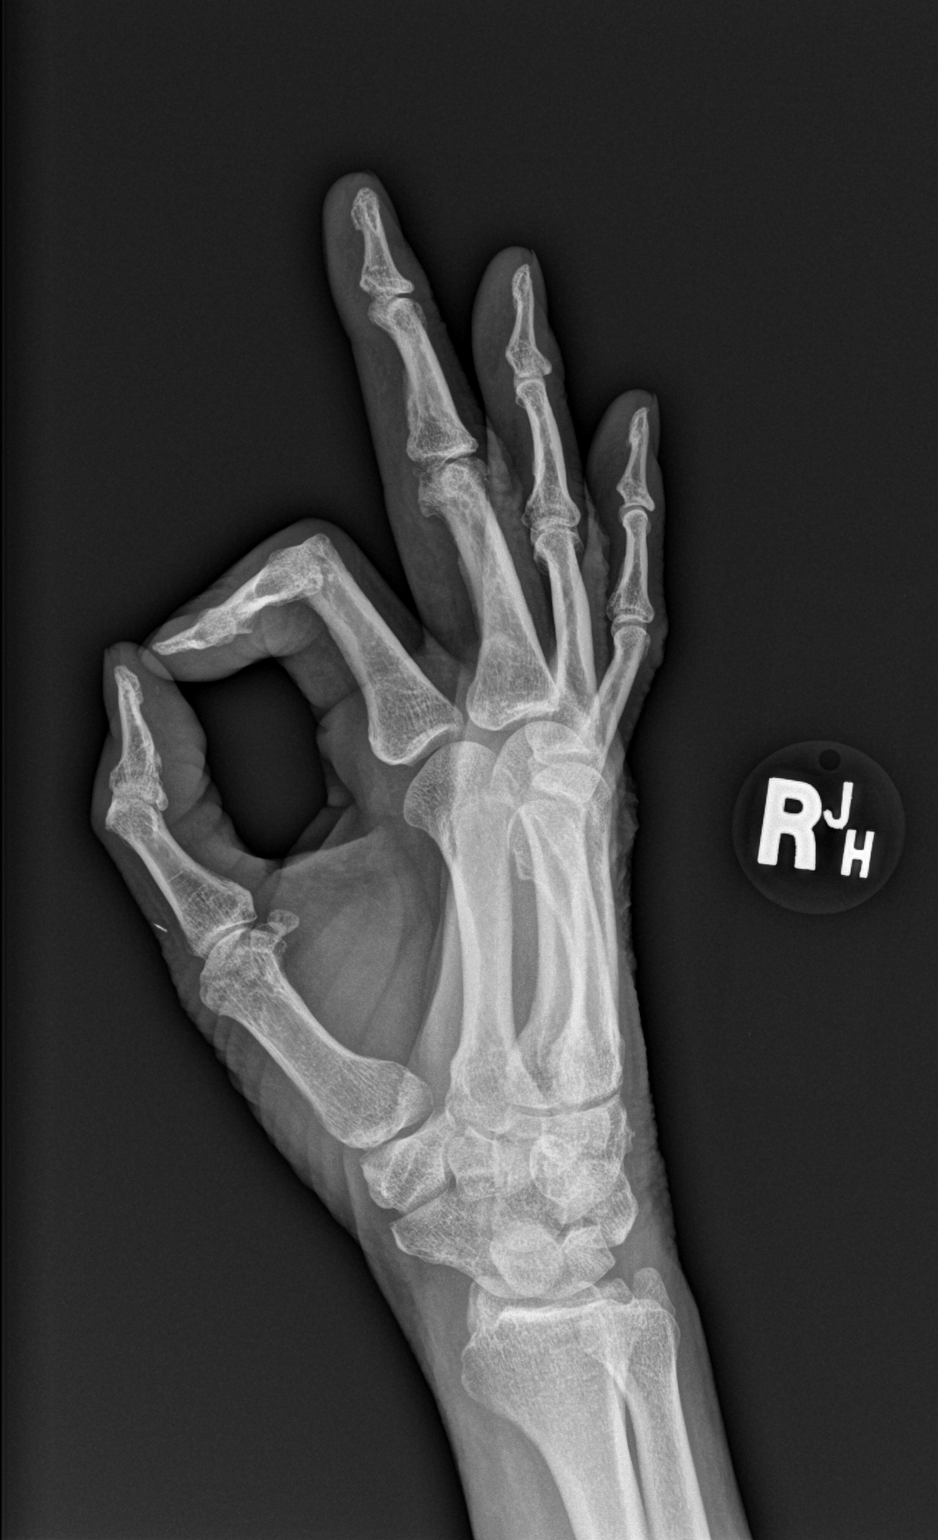

[3 of 3 positions shown; findings below may reference images not displayed]

FINDINGS: No acute bony abnormality. Specifically, no fracture, subluxation,
or dislocation. Soft tissues are intact. Joint spaces are
maintained.
IMPRESSION: No acute bony abnormality.

## 2015-09-08 ENCOUNTER — Emergency Department (HOSPITAL_COMMUNITY): Payer: Medicare Other

## 2015-09-08 ENCOUNTER — Encounter (HOSPITAL_COMMUNITY): Payer: Self-pay | Admitting: Emergency Medicine

## 2015-09-08 ENCOUNTER — Inpatient Hospital Stay (HOSPITAL_COMMUNITY)
Admission: EM | Admit: 2015-09-08 | Discharge: 2015-09-13 | DRG: 175 | Disposition: A | Discharge status: Home / Self Care | Payer: Medicare Other | Attending: Internal Medicine | Admitting: Internal Medicine

## 2015-09-08 DIAGNOSIS — E440 Moderate protein-calorie malnutrition: Secondary | ICD-10-CM | POA: Diagnosis present

## 2015-09-08 DIAGNOSIS — D696 Thrombocytopenia, unspecified: Secondary | ICD-10-CM | POA: Diagnosis present

## 2015-09-08 DIAGNOSIS — Z79899 Other long term (current) drug therapy: Secondary | ICD-10-CM

## 2015-09-08 DIAGNOSIS — D6959 Other secondary thrombocytopenia: Secondary | ICD-10-CM | POA: Diagnosis present

## 2015-09-08 DIAGNOSIS — Y92009 Unspecified place in unspecified non-institutional (private) residence as the place of occurrence of the external cause: Secondary | ICD-10-CM

## 2015-09-08 DIAGNOSIS — Z79891 Long term (current) use of opiate analgesic: Secondary | ICD-10-CM

## 2015-09-08 DIAGNOSIS — Z809 Family history of malignant neoplasm, unspecified: Secondary | ICD-10-CM

## 2015-09-08 DIAGNOSIS — Z87891 Personal history of nicotine dependence: Secondary | ICD-10-CM

## 2015-09-08 DIAGNOSIS — Z23 Encounter for immunization: Secondary | ICD-10-CM

## 2015-09-08 DIAGNOSIS — J449 Chronic obstructive pulmonary disease, unspecified: Secondary | ICD-10-CM | POA: Diagnosis present

## 2015-09-08 DIAGNOSIS — I959 Hypotension, unspecified: Secondary | ICD-10-CM | POA: Diagnosis present

## 2015-09-08 DIAGNOSIS — K219 Gastro-esophageal reflux disease without esophagitis: Secondary | ICD-10-CM | POA: Diagnosis present

## 2015-09-08 DIAGNOSIS — D6481 Anemia due to antineoplastic chemotherapy: Secondary | ICD-10-CM | POA: Diagnosis present

## 2015-09-08 DIAGNOSIS — R000 Tachycardia, unspecified: Secondary | ICD-10-CM | POA: Diagnosis present

## 2015-09-08 DIAGNOSIS — Z85820 Personal history of malignant melanoma of skin: Secondary | ICD-10-CM

## 2015-09-08 DIAGNOSIS — I2699 Other pulmonary embolism without acute cor pulmonale: Secondary | ICD-10-CM | POA: Diagnosis not present

## 2015-09-08 DIAGNOSIS — T451X5A Adverse effect of antineoplastic and immunosuppressive drugs, initial encounter: Secondary | ICD-10-CM | POA: Diagnosis present

## 2015-09-08 DIAGNOSIS — Z8249 Family history of ischemic heart disease and other diseases of the circulatory system: Secondary | ICD-10-CM

## 2015-09-08 DIAGNOSIS — R112 Nausea with vomiting, unspecified: Secondary | ICD-10-CM

## 2015-09-08 DIAGNOSIS — E876 Hypokalemia: Secondary | ICD-10-CM | POA: Diagnosis present

## 2015-09-08 DIAGNOSIS — E1165 Type 2 diabetes mellitus with hyperglycemia: Secondary | ICD-10-CM | POA: Diagnosis present

## 2015-09-08 DIAGNOSIS — E8770 Fluid overload, unspecified: Secondary | ICD-10-CM | POA: Diagnosis present

## 2015-09-08 DIAGNOSIS — E119 Type 2 diabetes mellitus without complications: Secondary | ICD-10-CM

## 2015-09-08 DIAGNOSIS — D630 Anemia in neoplastic disease: Secondary | ICD-10-CM | POA: Diagnosis present

## 2015-09-08 DIAGNOSIS — K5900 Constipation, unspecified: Secondary | ICD-10-CM | POA: Diagnosis present

## 2015-09-08 DIAGNOSIS — Z8711 Personal history of peptic ulcer disease: Secondary | ICD-10-CM

## 2015-09-08 DIAGNOSIS — Z66 Do not resuscitate: Secondary | ICD-10-CM | POA: Diagnosis present

## 2015-09-08 DIAGNOSIS — R778 Other specified abnormalities of plasma proteins: Secondary | ICD-10-CM | POA: Diagnosis present

## 2015-09-08 DIAGNOSIS — J90 Pleural effusion, not elsewhere classified: Secondary | ICD-10-CM | POA: Diagnosis present

## 2015-09-08 DIAGNOSIS — K3184 Gastroparesis: Secondary | ICD-10-CM | POA: Diagnosis present

## 2015-09-08 DIAGNOSIS — C3490 Malignant neoplasm of unspecified part of unspecified bronchus or lung: Secondary | ICD-10-CM | POA: Diagnosis present

## 2015-09-08 DIAGNOSIS — Z86711 Personal history of pulmonary embolism: Secondary | ICD-10-CM

## 2015-09-08 DIAGNOSIS — E1143 Type 2 diabetes mellitus with diabetic autonomic (poly)neuropathy: Secondary | ICD-10-CM | POA: Diagnosis present

## 2015-09-08 DIAGNOSIS — R0602 Shortness of breath: Secondary | ICD-10-CM | POA: Diagnosis not present

## 2015-09-08 DIAGNOSIS — J9601 Acute respiratory failure with hypoxia: Secondary | ICD-10-CM | POA: Diagnosis present

## 2015-09-08 DIAGNOSIS — W19XXXA Unspecified fall, initial encounter: Secondary | ICD-10-CM | POA: Diagnosis present

## 2015-09-08 DIAGNOSIS — E780 Pure hypercholesterolemia: Secondary | ICD-10-CM | POA: Diagnosis present

## 2015-09-08 DIAGNOSIS — Z6826 Body mass index (BMI) 26.0-26.9, adult: Secondary | ICD-10-CM

## 2015-09-08 LAB — PROTIME-INR
INR: 1.2 (ref 0.00–1.49)
PROTHROMBIN TIME: 15.4 s — AB (ref 11.6–15.2)

## 2015-09-08 LAB — CBC WITH DIFFERENTIAL/PLATELET
BASOS ABS: 0 10*3/uL (ref 0.0–0.1)
BASOS PCT: 0 %
EOS ABS: 0.1 10*3/uL (ref 0.0–0.7)
Eosinophils Relative: 1 %
HCT: 38.1 % — ABNORMAL LOW (ref 39.0–52.0)
HEMOGLOBIN: 12.5 g/dL — AB (ref 13.0–17.0)
Lymphocytes Relative: 15 %
Lymphs Abs: 1.2 10*3/uL (ref 0.7–4.0)
MCH: 29.6 pg (ref 26.0–34.0)
MCHC: 32.8 g/dL (ref 30.0–36.0)
MCV: 90.3 fL (ref 78.0–100.0)
MONOS PCT: 9 %
Monocytes Absolute: 0.7 10*3/uL (ref 0.1–1.0)
NEUTROS ABS: 5.8 10*3/uL (ref 1.7–7.7)
NEUTROS PCT: 75 %
Platelets: 148 10*3/uL — ABNORMAL LOW (ref 150–400)
RBC: 4.22 MIL/uL (ref 4.22–5.81)
RDW: 13.8 % (ref 11.5–15.5)
WBC: 7.8 10*3/uL (ref 4.0–10.5)

## 2015-09-08 LAB — COMPREHENSIVE METABOLIC PANEL
ALT: 13 U/L — AB (ref 17–63)
AST: 19 U/L (ref 15–41)
Albumin: 3.9 g/dL (ref 3.5–5.0)
Alkaline Phosphatase: 61 U/L (ref 38–126)
Anion gap: 8 (ref 5–15)
BUN: 12 mg/dL (ref 6–20)
CHLORIDE: 107 mmol/L (ref 101–111)
CO2: 24 mmol/L (ref 22–32)
Calcium: 8.9 mg/dL (ref 8.9–10.3)
Creatinine, Ser: 0.97 mg/dL (ref 0.61–1.24)
Glucose, Bld: 195 mg/dL — ABNORMAL HIGH (ref 65–99)
POTASSIUM: 3.9 mmol/L (ref 3.5–5.1)
SODIUM: 139 mmol/L (ref 135–145)
Total Bilirubin: 0.4 mg/dL (ref 0.3–1.2)
Total Protein: 7 g/dL (ref 6.5–8.1)

## 2015-09-08 LAB — BRAIN NATRIURETIC PEPTIDE: B NATRIURETIC PEPTIDE 5: 42.1 pg/mL (ref 0.0–100.0)

## 2015-09-08 LAB — TROPONIN I: TROPONIN I: 0.22 ng/mL — AB (ref <0.031)

## 2015-09-08 LAB — APTT: aPTT: 30 seconds (ref 24–37)

## 2015-09-08 MED ORDER — IOHEXOL 350 MG/ML SOLN
100.0000 mL | Freq: Once | INTRAVENOUS | Status: AC | PRN
  Administered 2015-09-08: 100 mL via INTRAVENOUS

## 2015-09-08 MED ORDER — HEPARIN (PORCINE) IN NACL 100-0.45 UNIT/ML-% IJ SOLN
1200.0000 [IU]/h | INTRAMUSCULAR | Status: DC
Start: 2015-09-08 — End: 2015-09-09
  Administered 2015-09-08: 1200 [IU]/h via INTRAVENOUS
  Filled 2015-09-08: qty 250

## 2015-09-08 MED ORDER — SODIUM CHLORIDE 0.9 % IV BOLUS (SEPSIS)
500.0000 mL | Freq: Once | INTRAVENOUS | Status: AC
  Administered 2015-09-08: 500 mL via INTRAVENOUS

## 2015-09-08 NOTE — ED Provider Notes (Signed)
CSN: [id number]     Arrival date & time 09/08/15  2001 History   First MD Initiated Contact with Patient 09/08/15 2023     Chief Complaint  Patient presents with  . Shortness of Breath     (Consider location/radiation/quality/duration/timing/severity/associated sxs/prior Treatment) Patient is a 69 y.o. male presenting with shortness of breath. The history is provided by the patient.  Shortness of Breath Associated symptoms: no abdominal pain, no chest pain, no headaches, no rash, no vomiting and no wheezing    patient presents with rather severe shortness of breath. Found to have pulse ox in the 80s. Improvement in the 90s on nonrebreather. States he was just bending over to wash the dog and began to feel short of breath. No chest pain. No cough. No swelling in his legs. Previous history of lung cancer and history of pulmonary embolism. Not on anticoagulation due to recent GI bleed. States he is not on therapy for the lung cancer at this time.  Past Medical History  Diagnosis Date  . DM (diabetes mellitus)   . Pneumonia   . Hypercholesteremia   . Melanoma   . Radiation 08/03/14-08/23/14    35 gray to right chest  . Lung cancer   . FH: chemotherapy   . Gastroparesis 02/09/2015  . Cholecystoduodenal fistula   . Pulmonary embolus   . Duodenal ulcer   . COPD (chronic obstructive pulmonary disease)   . Protein calorie malnutrition   . Elevated LFTs   . C. difficile enteritis   . Anemia    Past Surgical History  Procedure Laterality Date  . Video bronchoscopy Bilateral 07/20/2014    Procedure: VIDEO BRONCHOSCOPY WITHOUT FLUORO;  Surgeon: Tanda Rockers, MD;  Location: [facility];  Service: Cardiopulmonary;  Laterality: Bilateral;  . Esophagogastroduodenoscopy N/A 11/30/2014    Procedure: ESOPHAGOGASTRODUODENOSCOPY (EGD);  Surgeon: [name], MD;  Location: [facility];  Service: Endoscopy;  Laterality: N/A;  . Laparotomy N/A 11/30/2014    Procedure: EXPLORATORY LAPAROTOMY,  PYLOROPLASTY, OVERSEWING OF POSTERIOR DUODENUM, DUODENOSTOMY, CHOLECYSTECTOMY, JEJEUNOSTOMY;  Surgeon: Jackolyn Confer, MD;  Location: [facility];  Service: General;  Laterality: N/A;  . Pyloroplasty    . Cholecystectomy    . Duodenostomy tube    . Jejunostomy feeding tube     Family History  Problem Relation Age of Onset  . Heart disease Mother   . Cancer Mother     ? type   Social History  Substance Use Topics  . Smoking status: Former Smoker -- 2.00 packs/day for 50 years    Types: Cigarettes, Cigars    Quit date: 06/12/2014  . Smokeless tobacco: Former Systems developer    Types: [place] date: 09/07/2012  . Alcohol Use: No    Review of Systems  Constitutional: Positive for fatigue. Negative for activity change and appetite change.  Eyes: Negative for pain.  Respiratory: Positive for shortness of breath. Negative for chest tightness and wheezing.   Cardiovascular: Negative for chest pain and leg swelling.  Gastrointestinal: Negative for nausea, vomiting, abdominal pain and diarrhea.  Genitourinary: Negative for flank pain.  Musculoskeletal: Negative for back pain and neck stiffness.  Skin: Negative for rash.  Neurological: Negative for weakness, numbness and headaches.  Psychiatric/Behavioral: Negative for behavioral problems.      Allergies  Review of patient's allergies indicates no known allergies.  Home Medications   Prior to Admission medications   Medication Sig Start Date End Date Taking? Authorizing Provider  atorvastatin (LIPITOR) 80 MG tablet Take  80 mg by mouth at bedtime.    Yes Historical Provider, MD  budesonide-formoterol (SYMBICORT) 160-4.5 MCG/ACT inhaler Take 2 puffs first thing in am and then another 2 puffs about 12 hours later. Patient taking differently: Inhale 2 puffs into the lungs every 12 (twelve) hours as needed.  01/11/15  Yes Venetia Maxon Rama, MD  calcium citrate-vitamin D (CITRACAL+D) 315-200 MG-UNIT per tablet Take 1 tablet by mouth at bedtime.     Yes Historical Provider, MD  Dapagliflozin Propanediol (FARXIGA) 5 MG TABS Take 5 mg by mouth 2 (two) times daily.   Yes Historical Provider, MD  DULoxetine (CYMBALTA) 20 MG capsule Take 1 capsule (20 mg total) by mouth daily. 01/11/15  Yes Christina P Rama, MD  fentaNYL (DURAGESIC - DOSED MCG/HR) 75 MCG/HR Place 1 patch onto the skin every 3 (three) days. 07/25/15  Yes Historical Provider, MD  glimepiride (AMARYL) 4 MG tablet Take 4 mg by mouth at bedtime. Take before meals   Yes Historical Provider, MD  HYDROmorphone (DILAUDID) 4 MG tablet Take 4 mg by mouth every 8 (eight) hours as needed for severe pain.   Yes Historical Provider, MD  LORazepam (ATIVAN) 0.5 MG tablet Take 1 table by mouth every 12 hours as needed for anxiety 12/19/14  Yes Nishant Dhungel, MD  metFORMIN (GLUCOPHAGE) 500 MG tablet Take 500 mg by mouth 2 (two) times daily with a meal.   Yes Historical Provider, MD  Multiple Vitamins-Iron (MULTIVITAMINS WITH IRON) TABS tablet Take 1 tablet by mouth daily after supper. 12/19/14  Yes Nishant Dhungel, MD  pantoprazole (PROTONIX) 40 MG tablet Take 1 tablet (40 mg total) by mouth 2 (two) times daily. 12/19/14  Yes Nishant Dhungel, MD  potassium chloride SA (K-DUR,KLOR-CON) 20 MEQ tablet Take 2 tablets (40 mEq total) by mouth daily. 12/19/14  Yes Nishant Dhungel, MD  prochlorperazine (COMPAZINE) 10 MG tablet Take 1 tablet (10 mg total) by mouth every 6 (six) hours as needed for nausea or vomiting. 02/09/15  Yes Earnstine Regal, PA-C  saccharomyces boulardii (FLORASTOR) 250 MG capsule Take 250 mg by mouth 2 (two) times daily.   Yes Historical Provider, MD  temazepam (RESTORIL) 15 MG capsule Take 15 mg by mouth at bedtime as needed for sleep.   Yes Historical Provider, MD  bismuth subsalicylate (PEPTO BISMOL) 262 MG/15ML suspension Place 30 mLs into feeding tube every 4 (four) hours as needed for indigestion. 01/11/15   Venetia Maxon Rama, MD  Na Sulfate-K Sulfate-Mg Sulf SOLN Suprep-Use as  directed Patient not taking: Reported on 09/08/2015 08/03/15   [name], MD   BP 84/68 mmHg  Pulse 115  Temp(Src) 98.4 F (36.9 C) (Oral)  Resp 19  Wt 168 lb (76.204 kg)  SpO2 99% Physical Exam  Constitutional: He appears well-developed.  HENT:  Head: Atraumatic.  Neck: Neck supple.  Cardiovascular:  Tachycardia  Pulmonary/Chest: He has no wheezes.  Slightly decreased breath sounds on left compared to right. Patient is on a nonrebreather mask.  Abdominal: Soft. There is no tenderness.    ED Course  Procedures (including critical care time) Labs Review Labs Reviewed  CBC WITH DIFFERENTIAL/PLATELET - Abnormal; Notable for the following:    Hemoglobin 12.5 (*)    HCT 38.1 (*)    Platelets 148 (*)    All other components within normal limits  TROPONIN I - Abnormal; Notable for the following:    Troponin I 0.22 (*)    All other components within normal limits  COMPREHENSIVE METABOLIC PANEL -  Abnormal; Notable for the following:    Glucose, Bld 195 (*)    ALT 13 (*)    All other components within normal limits  BRAIN NATRIURETIC PEPTIDE  PROTIME-INR  APTT  HEPARIN LEVEL (UNFRACTIONATED)  CBC    Imaging Review Ct Angio Chest Pe W/cm &/or Wo Cm  09/08/2015   CLINICAL DATA:  Patient fell short of breath and and fell down. Decreased oxygen saturation. History of lung cancer.  EXAM: CT ANGIOGRAPHY CHEST WITH CONTRAST  TECHNIQUE: Multidetector CT imaging of the chest was performed using the standard protocol during bolus administration of intravenous contrast. Multiplanar CT image reconstructions and MIPs were obtained to evaluate the vascular anatomy.  CONTRAST:  190m OMNIPAQUE IOHEXOL 350 MG/ML SOLN  COMPARISON:  01/06/2015  FINDINGS: Technically adequate study with good opacification of the central and segmental pulmonary arteries. Filling defects are demonstrated in the left main pulmonary artery extending into left upper and lower lobe branches. This is consistent with  acute pulmonary embolus. Subsegmental emboli are suggested in the right upper lung as well. Visualize left main pulmonary emboli are new since previous study. The RV to LV ratio is increased at 2, consistent with sub massive pulmonary embolus with right heart strain.  Cardiac enlargement. Small pericardial effusion. Normal caliber thoracic aorta with calcification. Great vessel origins are patent. Esophagus is decompressed. No significant lymphadenopathy in the chest. Small right pleural effusion and minimal left pleural effusion, decreased since previous study. Atelectasis in the lung bases. There is consolidation and scarring in the right lung which may be related to postradiation changes in a patient with lung cancer. No definite residual recurrent mass lesion. Emphysematous changes in the left lung. No pneumothorax.  Included portions of the upper abdominal organs demonstrate a right adrenal gland nodule measuring 1.8 cm diameter. Hounsfield unit measurements are consistent with a fat containing adenoma.  Review of the MIP images confirms the above findings.  IMPRESSION: Positive for pulmonary emboli demonstrated in the left main and multiple segmental branches of the left pulmonary artery is well a subsegmental branches on the right. Elevated RV to LV ratio consistent with right heart strain. Small pleural effusions, decreasing since previous study. Atelectasis in the lung bases. Small pericardial effusion. Postradiation changes in the right upper lung.  These results were called by telephone at the time of interpretation on 09/08/2015 at 11:16 pm to Dr. NDavonna Belling, who verbally acknowledged these results.   Electronically Signed   By: WLucienne CapersM.D.   On: 09/08/2015 23:18   Dg Chest Portable 1 View  09/08/2015   CLINICAL DATA:  Shortness of breath. [place] the ground. Ex-smoker appear  EXAM: PORTABLE CHEST 1 VIEW  COMPARISON:  02/07/2015.  FINDINGS: Normal sized heart. Interval linear density  in the left lower lung zone. Stable right upper lobe scarring and linear scarring at the right medial lung base. Lower thoracic spine and left shoulder degenerative changes.  IMPRESSION: 1. Interval small amount of linear atelectasis or scarring in the left lower lung zone. 2. Stable right lung scarring.   Electronically Signed   By: SClaudie ReveringM.D.   On: 09/08/2015 20:55   Dg Hand Complete Left  09/07/2015   CLINICAL DATA:  Worsening bilateral hand pain  EXAM: LEFT HAND - COMPLETE 3+ VIEW  COMPARISON:  None.  FINDINGS: No fracture or dislocation is seen.  Mild degenerative changes at the 2nd and 3rd DIP joints. No marginal erosions.  Visualized soft tissues are within normal limits.  IMPRESSION: No acute osseus abnormality is seen.  Mild degenerative changes.  No marginal erosions.   Electronically Signed   By: Julian Hy M.D.   On: 09/07/2015 14:36   Dg Hand Complete Right  09/07/2015   CLINICAL DATA:  Worsening bilateral hand pain for weeks. No known injury.  EXAM: RIGHT HAND - COMPLETE 3+ VIEW  COMPARISON:  None.  FINDINGS: No acute bony abnormality. Specifically, no fracture, subluxation, or dislocation. Soft tissues are intact. Joint spaces are maintained.  IMPRESSION: No acute bony abnormality.   Electronically Signed   By: Rolm Baptise M.D.   On: 09/07/2015 14:35   I have personally reviewed and evaluated these images and lab results as part of my medical decision-making.   EKG Interpretation   Date/Time:  Saturday September 08 2015 20:07:07 EDT Ventricular Rate:  126 PR Interval:  112 QRS Duration: 102 QT Interval:  408 QTC Calculation: 591 R Axis:   59 Text Interpretation:  Sinus tachycardia Prominent P waves, nondiagnostic  Borderline repolarization abnormality Prolonged QT interval non-specific  ST changes inferior and laterally Confirmed by Alvino Chapel  MD, Ovid Curd  [phone]) on 09/08/2015 8:23:24 PM Also confirmed by Alvino Chapel  MD, Ovid Curd  [phone])  on 09/08/2015 10:14:01 PM       MDM   Final diagnoses:  Pulmonary embolism    Patient with acute onset shortness of breath. Previous history of pulmonary embolism. Also has had lung cancer. Previous GI bleed. Has had hypotension tachycardia and hypoxia here. Troponin mildly elevated and somewhat diffuse ST changes. CT angiogram shows large hemodynamically significant pulmonary embolism. Has been seen by critical care and will transfer to [facility] further treatment. Heparin has been started IV. Patient may get catheter directed TPA.   CRITICAL CARE Performed by: Mackie Pai Total critical care time: 30 Critical care time was exclusive of separately billable procedures and treating other patients. Critical care was necessary to treat or prevent imminent or life-threatening deterioration. Critical care was time spent personally by me on the following activities: development of treatment plan with patient and/or surrogate as well as nursing, discussions with consultants, evaluation of patient's response to treatment, examination of patient, obtaining history from patient or surrogate, ordering and performing treatments and interventions, ordering and review of laboratory studies, ordering and review of radiographic studies, pulse oximetry and re-evaluation of patient's condition.   Davonna Belling, MD 09/08/15 [phone]

## 2015-09-08 NOTE — ED Notes (Signed)
RN WILL DRAW INR AND PTT WHILE STARTING 2ND IV

## 2015-09-08 NOTE — Progress Notes (Addendum)
ANTICOAGULATION CONSULT NOTE - Initial Consult  Pharmacy Consult for Heparin Indication: R/O Pulmonary Embolism  No Known Allergies  Patient Measurements:    Weight : 76.4 kg (07/24/15) Height : 67 inches IBW:  66 kg Heparin Dosing Weight: 76 kg  Vital Signs: Temp: 98.4 F (36.9 C) (09/24 2024) Temp Source: Oral (09/24 2024) BP: 91/69 mmHg (09/24 2234) Pulse Rate: 114 (09/24 2234)  Labs:  Recent Labs  09/08/15 2053  HGB 12.5*  HCT 38.1*  PLT 148*  CREATININE 0.97  TROPONINI 0.22*    CrCl cannot be calculated (Unknown ideal weight.).   Medical History: Past Medical History  Diagnosis Date  . DM (diabetes mellitus)   . Pneumonia   . Hypercholesteremia   . Melanoma   . Radiation 08/03/14-08/23/14    35 gray to right chest  . Lung cancer   . FH: chemotherapy   . Gastroparesis 02/09/2015  . Cholecystoduodenal fistula   . Pulmonary embolus   . Duodenal ulcer   . COPD (chronic obstructive pulmonary disease)   . Protein calorie malnutrition   . Elevated LFTs   . C. difficile enteritis   . Anemia     Medications:  Scheduled:   Infusions:  . heparin      Assessment:  69 yr male with complaint of SOB.  H/O lung cancer (currently not undergoing treatment) and h/o pulmonary embolism (no longer on anticoagulation due to recent GI bleed)  CT Angio in process  Pharmacy consulted to begin IV Heparin for treatment of suspected Pulmonary Embolism  Given note of recent GI bleed, will not bolus heparin  Goal of Therapy:  Heparin level 0.3-0.7 units/ml (aim for lower end of range 0.3-0.5 due to h/o GI bleed) Monitor platelets by anticoagulation protocol: Yes   Plan:   Obtain baseline aPTT and PT/INR  No heparin bolus due to recent GI bleed  Begin IV heparin gtt @ 1200 units/hr  Check heparin level 6 hr after heparin started  Follow daily CBC and heparin level  Poindexter, Toribio Harbour, PharmD 09/08/2015,11:08 PM  ADDENDUM:  CTAngio = + PE demonstrated  in the left main and multiple segmental branches of the left pulmonary artery is well a subsegmental branches on the right.  CCM consulted and decision to stop IV heparin infusion and give tPA '100mg'$  IV x 1 dose over 2 hrs.  No heparin bolus had been given.  IV heparin had been initiated at 23:29 and was d/c'ed @ 00:25.  Phamacy consulted to resume IV heparin therapy for PE s/p tPA once aPTT < 80 sec  Plan:    APTT to be drawn within 30 minutes of completion of tPA infusion.  Will monitor aPTT per protocol (q1-2 hrs) and resume heparin without bolus once aPTT < 80 sec.  Leone Haven, PharmD 09/09/15 @ 00:41

## 2015-09-08 NOTE — ED Notes (Signed)
[facility] states patient was washing dog, felt short of breath then fell down. Patient was able to get up and make his way to bathroom. EMS found him sitting in bathroom, short of breath, SPO2 at 82 on room air. Patient placed on nasal canula at 6 L, O2 stat only raised to 90. Patient unable to ambulate without experiencing O2 desaturation. EMS started 18 gauge IV right AC. Patient received approximately 750 ml NS on trip to ED.

## 2015-09-08 NOTE — ED Notes (Addendum)
X RAY at bedside 

## 2015-09-08 NOTE — H&P (Signed)
PULMONARY / CRITICAL CARE MEDICINE   Name: Daryl Vega MRN: [id number] DOB: 1946-04-24    ADMISSION DATE:  09/08/2015 CONSULTATION DATE:  9/24  REFERRING MD :  Alvino Chapel   CHIEF COMPLAINT:  Pulmonary emboli   INITIAL PRESENTATION:  69 year old male w/ h/o Non-small cell lung cancer stage IIIB (no treatment since 2015 d/t deconditioning). Admitted on 9/25 w/ Acute submassive PE, ongoing hypotension and troponin rise. PCCM asked to admit   STUDIES:  CT chest 9/25: Positive for pulmonary emboli demonstrated in the left main and multiple segmental branches of the left pulmonary artery is well a subsegmental branches on the right. Elevated RV to LV ratio consistent with right heart strain. Small pleural effusions, decreasing since previous study. Atelectasis in the lung bases. Small pericardial effusion. Postradiation changes in the right upper lung. ECHO 9/25:>>> LE dopplers 9/25>>>  SIGNIFICANT EVENTS:    HISTORY OF PRESENT ILLNESS:   69 year old male w/ sg h/o NSCLC state IIIB . Completed rx 2015 and not treated for his cancer since d/t deconditioning after extensive abd surgery for bleeding duodenal ulcer, ultimately underwent: oversewing of a posterior bleeding duodenal ulcer, pyloroplasty, cholecystectomy, repair of right hepatic artery, repair of incidental cholecystoduodenal fistula and placement of a duodenostomy tube and feeding jejunostomy. These have since been removed. This was c/b PE after he was discharged from this.  Was at home, felt short of breath and fell down. Was able to make it to the restroom. EMS called. SPO2 on arrival 82% on room air. Responded to 6 liters [place]. CT scan showed large PE w/ RV top LV ratio of 2. PCCM asked to eval given on-going hypotension and troponin rise.   PAST MEDICAL HISTORY :   has a past medical history of DM (diabetes mellitus); Pneumonia; Hypercholesteremia; Melanoma; Radiation (08/03/14-08/23/14); Lung cancer; FH: chemotherapy; Gastroparesis  (02/09/2015); Cholecystoduodenal fistula; Pulmonary embolus; Duodenal ulcer; COPD (chronic obstructive pulmonary disease); Protein calorie malnutrition; Elevated LFTs; C. difficile enteritis; and Anemia.  has past surgical history that includes Video bronchoscopy (Bilateral, 07/20/2014); Esophagogastroduodenoscopy (N/A, 11/30/2014); laparotomy (N/A, 11/30/2014); Pyloroplasty; Cholecystectomy; duodenostomy tube; and Jejunostomy feeding tube. Prior to Admission medications   Medication Sig Start Date End Date Taking? Authorizing Provider  atorvastatin (LIPITOR) 80 MG tablet Take 80 mg by mouth at bedtime.    Yes Historical Provider, MD  budesonide-formoterol (SYMBICORT) 160-4.5 MCG/ACT inhaler Take 2 puffs first thing in am and then another 2 puffs about 12 hours later. Patient taking differently: Inhale 2 puffs into the lungs every 12 (twelve) hours as needed.  01/11/15  Yes Venetia Maxon Rama, MD  calcium citrate-vitamin D (CITRACAL+D) 315-200 MG-UNIT per tablet Take 1 tablet by mouth at bedtime.    Yes Historical Provider, MD  Dapagliflozin Propanediol (FARXIGA) 5 MG TABS Take 5 mg by mouth 2 (two) times daily.   Yes Historical Provider, MD  DULoxetine (CYMBALTA) 20 MG capsule Take 1 capsule (20 mg total) by mouth daily. 01/11/15  Yes Christina P Rama, MD  fentaNYL (DURAGESIC - DOSED MCG/HR) 75 MCG/HR Place 1 patch onto the skin every 3 (three) days. 07/25/15  Yes Historical Provider, MD  glimepiride (AMARYL) 4 MG tablet Take 4 mg by mouth at bedtime. Take before meals   Yes Historical Provider, MD  HYDROmorphone (DILAUDID) 4 MG tablet Take 4 mg by mouth every 8 (eight) hours as needed for severe pain.   Yes Historical Provider, MD  LORazepam (ATIVAN) 0.5 MG tablet Take 1 table by mouth every 12 hours as needed  for anxiety 12/19/14  Yes Nishant Dhungel, MD  metFORMIN (GLUCOPHAGE) 500 MG tablet Take 500 mg by mouth 2 (two) times daily with a meal.   Yes Historical Provider, MD  Multiple Vitamins-Iron  (MULTIVITAMINS WITH IRON) TABS tablet Take 1 tablet by mouth daily after supper. 12/19/14  Yes Nishant Dhungel, MD  pantoprazole (PROTONIX) 40 MG tablet Take 1 tablet (40 mg total) by mouth 2 (two) times daily. 12/19/14  Yes Nishant Dhungel, MD  potassium chloride SA (K-DUR,KLOR-CON) 20 MEQ tablet Take 2 tablets (40 mEq total) by mouth daily. 12/19/14  Yes Nishant Dhungel, MD  prochlorperazine (COMPAZINE) 10 MG tablet Take 1 tablet (10 mg total) by mouth every 6 (six) hours as needed for nausea or vomiting. 02/09/15  Yes Earnstine Regal, PA-C  saccharomyces boulardii (FLORASTOR) 250 MG capsule Take 250 mg by mouth 2 (two) times daily.   Yes Historical Provider, MD  temazepam (RESTORIL) 15 MG capsule Take 15 mg by mouth at bedtime as needed for sleep.   Yes Historical Provider, MD  bismuth subsalicylate (PEPTO BISMOL) 262 MG/15ML suspension Place 30 mLs into feeding tube every 4 (four) hours as needed for indigestion. 01/11/15   Venetia Maxon Rama, MD  Na Sulfate-K Sulfate-Mg Sulf SOLN Suprep-Use as directed Patient not taking: Reported on 09/08/2015 08/03/15   [name], MD   No Known Allergies  FAMILY HISTORY:  has no family status information on file.  SOCIAL HISTORY:  reports that he quit smoking about 14 months ago. His smoking use included Cigarettes and Cigars. He has a 100 pack-year smoking history. He quit smokeless tobacco use about 3 years ago. His smokeless tobacco use included Chew. He reports that he does not drink alcohol or use illicit drugs.  REVIEW OF SYSTEMS:   HENT: no HA, nasal congestion, sore throat. Pulm: no cough, + SOB, no wheeze, no CP. Card: no CP no palps. EXT: no pain. No swelling Abd: no pain, NVD, GU: no acute Neuro: no acute   SUBJECTIVE:  Wants to go home  VITAL SIGNS: Temp:  [98.4 F (36.9 C)] 98.4 F (36.9 C) (09/24 2024) Pulse Rate:  [113-125] 115 (09/24 2322) Resp:  [15-21] 19 (09/24 2322) BP: (84-104)/(68-75) 84/68 mmHg (09/24 2322) SpO2:  [85 %-100 %] 99  % (09/24 2322) Weight:  [76.204 kg (168 lb)] 76.204 kg (168 lb) (09/24 2322) HEMODYNAMICS:   VENTILATOR SETTINGS:   INTAKE / OUTPUT: No intake or output data in the 24 hours ending 09/08/15 2330  PHYSICAL EXAMINATION: General:  Awake, alert, not in distress.  Neuro:  Awake, alert no focal def  HEENT:  NCAT, no JVD  Cardiovascular:  Tachy rrr  Lungs:  Crackles both bases. No accessory muscle use  Abdomen:  Soft, not tender. Healed surgical wounds  Musculoskeletal:  Intact  Skin:  No sig edema   LABS:  CBC  Recent Labs Lab 09/08/15 2053  WBC 7.8  HGB 12.5*  HCT 38.1*  PLT 148*   Coag's No results for input(s): APTT, INR in the last 168 hours. BMET  Recent Labs Lab 09/08/15 2053  NA 139  K 3.9  CL 107  CO2 24  BUN 12  CREATININE 0.97  GLUCOSE 195*   Electrolytes  Recent Labs Lab 09/08/15 2053  CALCIUM 8.9   Sepsis Markers No results for input(s): LATICACIDVEN, PROCALCITON, O2SATVEN in the last 168 hours. ABG No results for input(s): PHART, PCO2ART, PO2ART in the last 168 hours. Liver Enzymes  Recent Labs Lab 09/08/15 2053  AST  19  ALT 13*  ALKPHOS 61  BILITOT 0.4  ALBUMIN 3.9   Cardiac Enzymes  Recent Labs Lab 09/08/15 2053  TROPONINI 0.22*   Glucose No results for input(s): GLUCAP in the last 168 hours.  Imaging Ct Angio Chest Pe W/cm &/or Wo Cm  09/08/2015   CLINICAL DATA:  Patient fell short of breath and and fell down. Decreased oxygen saturation. History of lung cancer.  EXAM: CT ANGIOGRAPHY CHEST WITH CONTRAST  TECHNIQUE: Multidetector CT imaging of the chest was performed using the standard protocol during bolus administration of intravenous contrast. Multiplanar CT image reconstructions and MIPs were obtained to evaluate the vascular anatomy.  CONTRAST:  162m OMNIPAQUE IOHEXOL 350 MG/ML SOLN  COMPARISON:  01/06/2015  FINDINGS: Technically adequate study with good opacification of the central and segmental pulmonary arteries.  Filling defects are demonstrated in the left main pulmonary artery extending into left upper and lower lobe branches. This is consistent with acute pulmonary embolus. Subsegmental emboli are suggested in the right upper lung as well. Visualize left main pulmonary emboli are new since previous study. The RV to LV ratio is increased at 2, consistent with sub massive pulmonary embolus with right heart strain.  Cardiac enlargement. Small pericardial effusion. Normal caliber thoracic aorta with calcification. Great vessel origins are patent. Esophagus is decompressed. No significant lymphadenopathy in the chest. Small right pleural effusion and minimal left pleural effusion, decreased since previous study. Atelectasis in the lung bases. There is consolidation and scarring in the right lung which may be related to postradiation changes in a patient with lung cancer. No definite residual recurrent mass lesion. Emphysematous changes in the left lung. No pneumothorax.  Included portions of the upper abdominal organs demonstrate a right adrenal gland nodule measuring 1.8 cm diameter. Hounsfield unit measurements are consistent with a fat containing adenoma.  Review of the MIP images confirms the above findings.  IMPRESSION: Positive for pulmonary emboli demonstrated in the left main and multiple segmental branches of the left pulmonary artery is well a subsegmental branches on the right. Elevated RV to LV ratio consistent with right heart strain. Small pleural effusions, decreasing since previous study. Atelectasis in the lung bases. Small pericardial effusion. Postradiation changes in the right upper lung.  These results were called by telephone at the time of interpretation on 09/08/2015 at 11:16 pm to Dr. NDavonna Belling, who verbally acknowledged these results.   Electronically Signed   By: WLucienne CapersM.D.   On: 09/08/2015 23:18   Dg Chest Portable 1 View  09/08/2015   CLINICAL DATA:  Shortness of breath. F[place] to the ground. Ex-smoker appear  EXAM: PORTABLE CHEST 1 VIEW  COMPARISON:  02/07/2015.  FINDINGS: Normal sized heart. Interval linear density in the left lower lung zone. Stable right upper lobe scarring and linear scarring at the right medial lung base. Lower thoracic spine and left shoulder degenerative changes.  IMPRESSION: 1. Interval small amount of linear atelectasis or scarring in the left lower lung zone. 2. Stable right lung scarring.   Electronically Signed   By: SClaudie ReveringM.D.   On: 09/08/2015 20:55     ASSESSMENT / PLAN:  PULMONARY OETT A: Acute hypoxic respiratory failure (initial sats in 80s on room air) Acute and recurrent Pulmonary embolism is setting of known lung cancer: PESI score 4; RV/LV ratio 2 P:   Given hypotension,tachycadia and troponin rise we will go ahead and give systemic TPA  STAT echo LE dopplers  Admit to ICU  Wean O2 as needed  CARDIOVASCULAR CVL A:  ST Elevated troponin in setting of PE  P:  Admit to ICU Cycle CEs STAT ECHO  RENAL A:   No acute  P:   IV hydration  Avoid hypotension   GASTROINTESTINAL A:   GERD Had h/o extensive abd surgery for bleeding duodenal ulcer, ultimately underwent: oversewing of a posterior bleeding duodenal ulcer, pyloroplasty, cholecystectomy, repair of right hepatic artery, repair of incidental cholecystoduodenal fistula and placement of a duodenostomy tube and feeding jejunostomy. These have since been removed.  P:   NPO for now Cont PPI   HEMATOLOGIC A:   H/o stage IIIB NSCLCA completed rx 2015; felt not strong enough for surgery therapy  Prior PE Anemia of chronic disease.  P:  Trend cbc See pulm section  Will need life long anticoagulation   INFECTIOUS A:  No acute  P:   Trend CBC and fever curve   ENDOCRINE A:   Hyperglycemia    P:   ssi   NEUROLOGIC A:   No acute  P:   RASS goal: 0 Supportive care   FAMILY  - Updates: family at bedside. He does request DNR status but wants  treatment  - Inter-disciplinary family meet or Palliative Care meeting due by:  9/31   TODAY'S SUMMARY:  Submassive PE w/ ongoing hypotension, tachycardia and troponin rise. Will go ahead w/ systemic TPA    Erick Colace ACNP-BC [place] Pager # [phone] OR # [phone] if no answer

## 2015-09-08 NOTE — ED Notes (Signed)
MD at bedside. 

## 2015-09-09 ENCOUNTER — Inpatient Hospital Stay (HOSPITAL_COMMUNITY): Payer: Medicare Other

## 2015-09-09 DIAGNOSIS — M7989 Other specified soft tissue disorders: Secondary | ICD-10-CM

## 2015-09-09 DIAGNOSIS — Z79891 Long term (current) use of opiate analgesic: Secondary | ICD-10-CM | POA: Diagnosis not present

## 2015-09-09 DIAGNOSIS — Z66 Do not resuscitate: Secondary | ICD-10-CM | POA: Diagnosis present

## 2015-09-09 DIAGNOSIS — J9601 Acute respiratory failure with hypoxia: Secondary | ICD-10-CM | POA: Diagnosis present

## 2015-09-09 DIAGNOSIS — W19XXXA Unspecified fall, initial encounter: Secondary | ICD-10-CM | POA: Diagnosis present

## 2015-09-09 DIAGNOSIS — D6959 Other secondary thrombocytopenia: Secondary | ICD-10-CM | POA: Diagnosis present

## 2015-09-09 DIAGNOSIS — K3184 Gastroparesis: Secondary | ICD-10-CM | POA: Diagnosis present

## 2015-09-09 DIAGNOSIS — Z23 Encounter for immunization: Secondary | ICD-10-CM | POA: Diagnosis not present

## 2015-09-09 DIAGNOSIS — E08311 Diabetes mellitus due to underlying condition with unspecified diabetic retinopathy with macular edema: Secondary | ICD-10-CM | POA: Diagnosis not present

## 2015-09-09 DIAGNOSIS — Z79899 Other long term (current) drug therapy: Secondary | ICD-10-CM | POA: Diagnosis not present

## 2015-09-09 DIAGNOSIS — Z8249 Family history of ischemic heart disease and other diseases of the circulatory system: Secondary | ICD-10-CM | POA: Diagnosis not present

## 2015-09-09 DIAGNOSIS — I2699 Other pulmonary embolism without acute cor pulmonale: Secondary | ICD-10-CM | POA: Diagnosis present

## 2015-09-09 DIAGNOSIS — Z86711 Personal history of pulmonary embolism: Secondary | ICD-10-CM | POA: Diagnosis not present

## 2015-09-09 DIAGNOSIS — R748 Abnormal levels of other serum enzymes: Secondary | ICD-10-CM | POA: Diagnosis present

## 2015-09-09 DIAGNOSIS — Z6826 Body mass index (BMI) 26.0-26.9, adult: Secondary | ICD-10-CM | POA: Diagnosis not present

## 2015-09-09 DIAGNOSIS — J90 Pleural effusion, not elsewhere classified: Secondary | ICD-10-CM | POA: Diagnosis present

## 2015-09-09 DIAGNOSIS — D630 Anemia in neoplastic disease: Secondary | ICD-10-CM | POA: Diagnosis present

## 2015-09-09 DIAGNOSIS — Z8711 Personal history of peptic ulcer disease: Secondary | ICD-10-CM | POA: Diagnosis not present

## 2015-09-09 DIAGNOSIS — E1165 Type 2 diabetes mellitus with hyperglycemia: Secondary | ICD-10-CM | POA: Diagnosis present

## 2015-09-09 DIAGNOSIS — T451X5A Adverse effect of antineoplastic and immunosuppressive drugs, initial encounter: Secondary | ICD-10-CM | POA: Diagnosis present

## 2015-09-09 DIAGNOSIS — K219 Gastro-esophageal reflux disease without esophagitis: Secondary | ICD-10-CM | POA: Diagnosis present

## 2015-09-09 DIAGNOSIS — I959 Hypotension, unspecified: Secondary | ICD-10-CM | POA: Diagnosis present

## 2015-09-09 DIAGNOSIS — D6481 Anemia due to antineoplastic chemotherapy: Secondary | ICD-10-CM | POA: Diagnosis present

## 2015-09-09 DIAGNOSIS — E1143 Type 2 diabetes mellitus with diabetic autonomic (poly)neuropathy: Secondary | ICD-10-CM | POA: Diagnosis present

## 2015-09-09 DIAGNOSIS — E440 Moderate protein-calorie malnutrition: Secondary | ICD-10-CM | POA: Diagnosis present

## 2015-09-09 DIAGNOSIS — Z809 Family history of malignant neoplasm, unspecified: Secondary | ICD-10-CM | POA: Diagnosis not present

## 2015-09-09 DIAGNOSIS — Z87891 Personal history of nicotine dependence: Secondary | ICD-10-CM | POA: Diagnosis not present

## 2015-09-09 DIAGNOSIS — R0602 Shortness of breath: Secondary | ICD-10-CM | POA: Diagnosis present

## 2015-09-09 DIAGNOSIS — E8770 Fluid overload, unspecified: Secondary | ICD-10-CM | POA: Diagnosis present

## 2015-09-09 DIAGNOSIS — C3490 Malignant neoplasm of unspecified part of unspecified bronchus or lung: Secondary | ICD-10-CM | POA: Diagnosis present

## 2015-09-09 DIAGNOSIS — E780 Pure hypercholesterolemia: Secondary | ICD-10-CM | POA: Diagnosis present

## 2015-09-09 DIAGNOSIS — K5900 Constipation, unspecified: Secondary | ICD-10-CM | POA: Diagnosis present

## 2015-09-09 DIAGNOSIS — Z85820 Personal history of malignant melanoma of skin: Secondary | ICD-10-CM | POA: Diagnosis not present

## 2015-09-09 DIAGNOSIS — R000 Tachycardia, unspecified: Secondary | ICD-10-CM | POA: Diagnosis present

## 2015-09-09 DIAGNOSIS — Y92009 Unspecified place in unspecified non-institutional (private) residence as the place of occurrence of the external cause: Secondary | ICD-10-CM | POA: Diagnosis not present

## 2015-09-09 DIAGNOSIS — E876 Hypokalemia: Secondary | ICD-10-CM | POA: Diagnosis present

## 2015-09-09 DIAGNOSIS — J449 Chronic obstructive pulmonary disease, unspecified: Secondary | ICD-10-CM | POA: Diagnosis present

## 2015-09-09 LAB — PHOSPHORUS: PHOSPHORUS: 3 mg/dL (ref 2.5–4.6)

## 2015-09-09 LAB — BASIC METABOLIC PANEL
Anion gap: 10 (ref 5–15)
BUN: 10 mg/dL (ref 6–20)
CALCIUM: 8.1 mg/dL — AB (ref 8.9–10.3)
CO2: 22 mmol/L (ref 22–32)
Chloride: 109 mmol/L (ref 101–111)
Creatinine, Ser: 0.86 mg/dL (ref 0.61–1.24)
GFR calc Af Amer: >60 mL/min (ref >60)
GLUCOSE: 181 mg/dL — AB (ref 65–99)
Potassium: 3.7 mmol/L (ref 3.5–5.1)
Sodium: 141 mmol/L (ref 135–145)

## 2015-09-09 LAB — CBC
HCT: 32.2 % — ABNORMAL LOW (ref 39.0–52.0)
HEMATOCRIT: 32 % — AB (ref 39.0–52.0)
HEMOGLOBIN: 10.7 g/dL — AB (ref 13.0–17.0)
Hemoglobin: 10.6 g/dL — ABNORMAL LOW (ref 13.0–17.0)
MCH: 30 pg (ref 26.0–34.0)
MCH: 30.4 pg (ref 26.0–34.0)
MCHC: 33.1 g/dL (ref 30.0–36.0)
MCHC: 33.2 g/dL (ref 30.0–36.0)
MCV: 90.7 fL (ref 78.0–100.0)
MCV: 91.5 fL (ref 78.0–100.0)
PLATELETS: 125 10*3/uL — AB (ref 150–400)
PLATELETS: 142 10*3/uL — AB (ref 150–400)
RBC: 3.53 MIL/uL — ABNORMAL LOW (ref 4.22–5.81)
RBC: 3.52 MIL/uL — AB (ref 4.22–5.81)
RDW: 13.9 % (ref 11.5–15.5)
RDW: 14 % (ref 11.5–15.5)
WBC: 5.1 10*3/uL (ref 4.0–10.5)
WBC: 6.8 10*3/uL (ref 4.0–10.5)

## 2015-09-09 LAB — APTT
APTT: 118 s — AB (ref 24–37)
APTT: 77 s — AB (ref 24–37)
aPTT: 35 seconds (ref 24–37)

## 2015-09-09 LAB — GLUCOSE, CAPILLARY
GLUCOSE-CAPILLARY: 141 mg/dL — AB (ref 65–99)
GLUCOSE-CAPILLARY: 109 mg/dL — AB (ref 65–99)
GLUCOSE-CAPILLARY: 155 mg/dL — AB (ref 65–99)
GLUCOSE-CAPILLARY: 147 mg/dL — AB (ref 65–99)
Glucose-Capillary: 158 mg/dL — ABNORMAL HIGH (ref 65–99)

## 2015-09-09 LAB — PROTIME-INR
INR: 1.28 (ref 0.00–1.49)
INR: 3.25 — AB (ref 0.00–1.49)
PROTHROMBIN TIME: 32.5 s — AB (ref 11.6–15.2)
Prothrombin Time: 16.2 seconds — ABNORMAL HIGH (ref 11.6–15.2)

## 2015-09-09 LAB — HEPARIN LEVEL (UNFRACTIONATED)

## 2015-09-09 LAB — MRSA PCR SCREENING: MRSA by PCR: NEGATIVE

## 2015-09-09 LAB — MAGNESIUM: MAGNESIUM: 1.5 mg/dL — AB (ref 1.7–2.4)

## 2015-09-09 MED ORDER — HEPARIN (PORCINE) IN NACL 100-0.45 UNIT/ML-% IJ SOLN
1100.0000 [IU]/h | INTRAMUSCULAR | Status: DC
  Filled 2015-09-09: qty 250

## 2015-09-09 MED ORDER — INSULIN ASPART 100 UNIT/ML ~~LOC~~ SOLN
0.0000 [IU] | Freq: Three times a day (TID) | SUBCUTANEOUS | Status: DC
  Administered 2015-09-10 – 2015-09-13 (×2): 1 [IU] via SUBCUTANEOUS

## 2015-09-09 MED ORDER — PROCHLORPERAZINE MALEATE 10 MG PO TABS
10.0000 mg | ORAL_TABLET | Freq: Four times a day (QID) | ORAL | Status: DC | PRN
  Administered 2015-09-09 – 2015-09-10 (×3): 10 mg via ORAL
  Filled 2015-09-09 (×5): qty 1

## 2015-09-09 MED ORDER — FENTANYL 75 MCG/HR TD PT72
75.0000 ug | MEDICATED_PATCH | TRANSDERMAL | Status: DC
  Administered 2015-09-09 – 2015-09-12 (×2): 75 ug via TRANSDERMAL
  Filled 2015-09-09 (×3): qty 1

## 2015-09-09 MED ORDER — SODIUM CHLORIDE 0.9 % IV SOLN
250.0000 mL | INTRAVENOUS | Status: DC | PRN
  Administered 2015-09-09: 250 mL via INTRAVENOUS

## 2015-09-09 MED ORDER — SODIUM CHLORIDE 0.9 % IV SOLN
250.0000 mL | Freq: Once | INTRAVENOUS | Status: AC
  Administered 2015-09-09: 250 mL via INTRAVENOUS

## 2015-09-09 MED ORDER — TEMAZEPAM 15 MG PO CAPS: 15.0000 mg | ORAL_CAPSULE | Freq: Every evening | ORAL | Status: DC | PRN

## 2015-09-09 MED ORDER — INSULIN ASPART 100 UNIT/ML ~~LOC~~ SOLN
0.0000 [IU] | SUBCUTANEOUS | Status: DC
  Administered 2015-09-09: 1 [IU] via SUBCUTANEOUS
  Administered 2015-09-09: 2 [IU] via SUBCUTANEOUS
  Administered 2015-09-09: 1 [IU] via SUBCUTANEOUS

## 2015-09-09 MED ORDER — CETYLPYRIDINIUM CHLORIDE 0.05 % MT LIQD
7.0000 mL | Freq: Two times a day (BID) | OROMUCOSAL | Status: DC
  Administered 2015-09-09 – 2015-09-11 (×5): 7 mL via OROMUCOSAL

## 2015-09-09 MED ORDER — INSULIN ASPART 100 UNIT/ML ~~LOC~~ SOLN: 0.0000 [IU] | Freq: Every day | SUBCUTANEOUS | Status: DC

## 2015-09-09 MED ORDER — ATORVASTATIN CALCIUM 80 MG PO TABS
80.0000 mg | ORAL_TABLET | Freq: Every day | ORAL | Status: DC
  Administered 2015-09-09 – 2015-09-12 (×4): 80 mg via ORAL
  Filled 2015-09-09: qty 1
  Filled 2015-09-09: qty 2
  Filled 2015-09-09: qty 1
  Filled 2015-09-09: qty 2

## 2015-09-09 MED ORDER — PANTOPRAZOLE SODIUM 40 MG PO TBEC
40.0000 mg | DELAYED_RELEASE_TABLET | Freq: Two times a day (BID) | ORAL | Status: DC
  Administered 2015-09-09 – 2015-09-13 (×8): 40 mg via ORAL
  Filled 2015-09-09 (×10): qty 1

## 2015-09-09 MED ORDER — HYDROMORPHONE HCL 2 MG PO TABS
2.0000 mg | ORAL_TABLET | Freq: Three times a day (TID) | ORAL | Status: DC | PRN
  Administered 2015-09-10 – 2015-09-12 (×4): 2 mg via ORAL
  Filled 2015-09-09 (×4): qty 1

## 2015-09-09 MED ORDER — LACTATED RINGERS IV SOLN
INTRAVENOUS | Status: DC
  Administered 2015-09-09 – 2015-09-11 (×5): via INTRAVENOUS

## 2015-09-09 MED ORDER — HEPARIN (PORCINE) IN NACL 100-0.45 UNIT/ML-% IJ SOLN
800.0000 [IU]/h | INTRAMUSCULAR | Status: DC
  Administered 2015-09-09: 1100 [IU]/h via INTRAVENOUS
  Administered 2015-09-09: 800 [IU]/h via INTRAVENOUS
  Filled 2015-09-09: qty 250

## 2015-09-09 MED ORDER — LORAZEPAM 0.5 MG PO TABS
0.5000 mg | ORAL_TABLET | ORAL | Status: DC | PRN
  Filled 2015-09-09: qty 1

## 2015-09-09 MED ORDER — ALTEPLASE (PULMONARY EMBOLISM) INFUSION
100.0000 mg | Freq: Once | INTRAVENOUS | Status: AC
  Administered 2015-09-09: 100 mg via INTRAVENOUS
  Filled 2015-09-09: qty 100

## 2015-09-09 MED ORDER — BUDESONIDE-FORMOTEROL FUMARATE 160-4.5 MCG/ACT IN AERO
2.0000 | INHALATION_SPRAY | Freq: Two times a day (BID) | RESPIRATORY_TRACT | Status: DC
  Administered 2015-09-09: 2 via RESPIRATORY_TRACT
  Filled 2015-09-09: qty 6

## 2015-09-09 MED ORDER — DULOXETINE HCL 20 MG PO CPEP
20.0000 mg | ORAL_CAPSULE | Freq: Every day | ORAL | Status: DC
  Administered 2015-09-09 – 2015-09-13 (×5): 20 mg via ORAL
  Filled 2015-09-09 (×5): qty 1

## 2015-09-09 MED ORDER — SACCHAROMYCES BOULARDII 250 MG PO CAPS
250.0000 mg | ORAL_CAPSULE | Freq: Two times a day (BID) | ORAL | Status: DC
  Administered 2015-09-09 – 2015-09-13 (×9): 250 mg via ORAL
  Filled 2015-09-09 (×9): qty 1

## 2015-09-09 MED ORDER — INFLUENZA VAC SPLIT QUAD 0.5 ML IM SUSY
0.5000 mL | PREFILLED_SYRINGE | INTRAMUSCULAR | Status: AC
  Administered 2015-09-10: 0.5 mL via INTRAMUSCULAR
  Filled 2015-09-09 (×2): qty 0.5

## 2015-09-09 NOTE — ED Notes (Signed)
MD Ramaswami at bedside

## 2015-09-09 NOTE — Progress Notes (Signed)
PULMONARY / CRITICAL CARE MEDICINE   Name:Daryl Vega UKG:[id number] DOB:1946/11/13   ADMISSION DATE: 09/08/2015 CONSULTATION DATE: 9/24  REFERRING MD : Alvino Chapel   CHIEF COMPLAINT: Pulmonary emboli   INITIAL PRESENTATION:  69 year old male w/ h/o Non-small cell lung cancer stage IIIB (no treatment since 2015 d/t deconditioning). Admitted on 9/25 w/ Acute submassive PE, ongoing hypotension and troponin rise. PCCM asked to admit   STUDIES:  CT chest 9/25: Positive for pulmonary emboli demonstrated in the left main and multiple segmental branches of the left pulmonary artery is well a subsegmental branches on the right. Elevated RV to LV ratio consistent with right heart strain. Small pleural effusions, decreasing since previous study. Atelectasis in the lung bases. Small pericardial effusion. Postradiation changes in the right upper lung. ECHO 9/25:>>> LE dopplers 9/25> DVT noted in the left femoral and popliteal veins.  SIGNIFICANT EVENTS: 9/25 s/p systemic TPA  HISTORY OF PRESENT ILLNESS:  69 year old male w/ sg h/o NSCLC state IIIB . Completed rx 2015 and not treated for his cancer since d/t deconditioning after extensive abd surgery for bleeding duodenal ulcer, ultimately underwent: oversewing of a posterior bleeding duodenal ulcer, pyloroplasty, cholecystectomy, repair of right hepatic artery, repair of incidental cholecystoduodenal fistula and placement of a duodenostomy tube and feeding jejunostomy. These have since been removed. This was c/b PE after he was discharged from this. Was at home, felt short of breath and fell down. Was able to make it to the restroom. EMS called. SPO2 on arrival 82% on room air. Responded to 6 liters [place]. CT scan showed large PE w/ RV top LV ratio of 2. PCCM asked to eval given on-going hypotension and troponin rise.   PAST MEDICAL HISTORY :   has a past medical history of DM (diabetes mellitus); Pneumonia;  Hypercholesteremia; Melanoma; Radiation (08/03/14-08/23/14); Lung cancer; FH: chemotherapy; Gastroparesis (02/09/2015); Cholecystoduodenal fistula; Pulmonary embolus; Duodenal ulcer; COPD (chronic obstructive pulmonary disease); Protein calorie malnutrition; Elevated LFTs; C. difficile enteritis; and Anemia.  has past surgical history that includes Video bronchoscopy (Bilateral, 07/20/2014); Esophagogastroduodenoscopy (N/A, 11/30/2014); laparotomy (N/A, 11/30/2014); Pyloroplasty; Cholecystectomy; duodenostomy tube; and Jejunostomy feeding tube. Prior to Admission medications   Medication Sig Start Date End Date Taking? Authorizing Provider  atorvastatin (LIPITOR) 80 MG tablet Take 80 mg by mouth at bedtime.    Yes Historical Provider, MD  budesonide-formoterol (SYMBICORT) 160-4.5 MCG/ACT inhaler Take 2 puffs first thing in am and then another 2 puffs about 12 hours later. Patient taking differently: Inhale 2 puffs into the lungs every 12 (twelve) hours as needed.  01/11/15  Yes Venetia Maxon Rama, MD  calcium citrate-vitamin D (CITRACAL+D) 315-200 MG-UNIT per tablet Take 1 tablet by mouth at bedtime.    Yes Historical Provider, MD  Dapagliflozin Propanediol (FARXIGA) 5 MG TABS Take 5 mg by mouth 2 (two) times daily.   Yes Historical Provider, MD  DULoxetine (CYMBALTA) 20 MG capsule Take 1 capsule (20 mg total) by mouth daily. 01/11/15  Yes Christina P Rama, MD  fentaNYL (DURAGESIC - DOSED MCG/HR) 75 MCG/HR Place 1 patch onto the skin every 3 (three) days. 07/25/15  Yes Historical Provider, MD  glimepiride (AMARYL) 4 MG tablet Take 4 mg by mouth at bedtime. Take before meals   Yes Historical Provider, MD  HYDROmorphone (DILAUDID) 4 MG tablet Take 4 mg by mouth every 8 (eight) hours as needed for severe pain.   Yes Historical Provider, MD  LORazepam (ATIVAN) 0.5 MG tablet Take 1 table by mouth every 12 hours as  needed for anxiety 12/19/14  Yes Nishant  Dhungel, MD  metFORMIN (GLUCOPHAGE) 500 MG tablet Take 500 mg by mouth 2 (two) times daily with a meal.   Yes Historical Provider, MD  Multiple Vitamins-Iron (MULTIVITAMINS WITH IRON) TABS tablet Take 1 tablet by mouth daily after supper. 12/19/14  Yes Nishant Dhungel, MD  pantoprazole (PROTONIX) 40 MG tablet Take 1 tablet (40 mg total) by mouth 2 (two) times daily. 12/19/14  Yes Nishant Dhungel, MD  potassium chloride SA (K-DUR,KLOR-CON) 20 MEQ tablet Take 2 tablets (40 mEq total) by mouth daily. 12/19/14  Yes Nishant Dhungel, MD  prochlorperazine (COMPAZINE) 10 MG tablet Take 1 tablet (10 mg total) by mouth every 6 (six) hours as needed for nausea or vomiting. 02/09/15  Yes Earnstine Regal, PA-C  saccharomyces boulardii (FLORASTOR) 250 MG capsule Take 250 mg by mouth 2 (two) times daily.   Yes Historical Provider, MD  temazepam (RESTORIL) 15 MG capsule Take 15 mg by mouth at bedtime as needed for sleep.   Yes Historical Provider, MD  bismuth subsalicylate (PEPTO BISMOL) 262 MG/15ML suspension Place 30 mLs into feeding tube every 4 (four) hours as needed for indigestion. 01/11/15   Venetia Maxon Rama, MD  Na Sulfate-K Sulfate-Mg Sulf SOLN Suprep-Use as directed Patient not taking: Reported on 09/08/2015 08/03/15   [name], MD   No Known Allergies  FAMILY HISTORY:  has no family status information on file.  SOCIAL HISTORY:  reports that he quit smoking about 14 months ago. His smoking use included Cigarettes and Cigars. He has a 100 pack-year smoking history. He quit smokeless tobacco use about 3 years ago. His smokeless tobacco use included Chew. He reports that he does not drink alcohol or use illicit drugs.   SUBJECTIVE:  Feels well. No dyspnea, chest pain.  VITAL SIGNS: Temp: [98.3 F (36.8 C)-98.9 F (37.2 C)] 98.3 F (36.8 C) (09/25 0800) Pulse Rate: [92-125] 92 (09/25 0800) Resp: [0-34] 26 (09/25 0800) BP: (84-110)/(46-75)  99/71 mmHg (09/25 0800) SpO2: [85 %-100 %] 99 % (09/25 0830) FiO2 (%): [28 %] 28 % (09/25 0830) Weight: [168 lb (76.204 kg)-170 lb 3.1 oz (77.2 kg)] 170 lb 3.1 oz (77.2 kg) (09/25 0145) HEMODYNAMICS:   VENTILATOR SETTINGS: Vent Mode: [-]  FiO2 (%): [28 %] 28 % INTAKE / OUTPUT:  Intake/Output Summary (Last 24 hours) at 09/09/15 0939 Last data filed at 09/09/15 0800  Gross per 24 hour  Intake  895 ml  Output  900 ml  Net  -5 ml    PHYSICAL EXAMINATION: General: Awake, alert, no distress Neuro: No focal deficits HEENT: PERRL, moist mucus membranes Cardiovascular: RRR, No MRG Lungs: Clear antr Abdomen: Soft, + BS  Skin: No edema   LABS:  CBC  Last Labs      Recent Labs Lab 09/08/15 2053 09/09/15 0220 09/09/15 0520  WBC 7.8 5.1 6.8  HGB 12.5* 10.6* 10.7*  HCT 38.1* 32.0* 32.2*  PLT 148* 125* 142*     Coag's  Last Labs      Recent Labs Lab 09/08/15 2330 09/09/15 0220 09/09/15 0520 09/09/15 0738  APTT 30 35 118* 77*  INR 1.20 1.28 3.25* --      BMET  Last Labs      Recent Labs Lab 09/08/15 2053 09/09/15 0220  NA 139 141  K 3.9 3.7  CL 107 109  CO2 24 22  BUN 12 10  CREATININE 0.97 0.86  GLUCOSE 195* 181*     Electrolytes  Last Labs  Recent Labs Lab 09/08/15 2053 09/09/15 0220  CALCIUM 8.9 8.1*  MG --  1.5*  PHOS --  3.0     Sepsis Markers  Last Labs     No results for input(s): LATICACIDVEN, PROCALCITON, O2SATVEN in the last 168 hours.   ABG  Last Labs     No results for input(s): PHART, PCO2ART, PO2ART in the last 168 hours.   Liver Enzymes  Last Labs      Recent Labs Lab 09/08/15 2053  AST 19  ALT 13*  ALKPHOS 61  BILITOT 0.4  ALBUMIN 3.9     Cardiac Enzymes  Last Labs      Recent Labs Lab 09/08/15 2053  TROPONINI 0.22*     Glucose  Last Labs      Recent Labs Lab 09/09/15 0304  09/09/15 0752  GLUCAP 141* 109*      ASSESSMENT / PLAN:  PULMONARY OETT A: Acute hypoxic respiratory failure (initial sats in 80s on room air) Acute and recurrent Pulmonary embolism is setting of known lung cancer: PESI score 4; RV/LV ratio 2 P:  Hemodynamics have improved after TPA. On heparin anticoagulation Follow echo  CARDIOVASCULAR CVL A:  ST Elevated troponin in setting of PE  P:  Echocardiogram  RENAL A:  No acute  P:  IV hydration  Avoid hypotension   GASTROINTESTINAL A:  GERD Had h/o extensive abd surgery for bleeding duodenal ulcer, ultimately underwent: oversewing of a posterior bleeding duodenal ulcer, pyloroplasty, cholecystectomy, repair of right hepatic artery, repair of incidental cholecystoduodenal fistula and placement of a duodenostomy tube and feeding jejunostomy. These have since been removed.  P:  Start PO diet Cont PPI   HEMATOLOGIC A:  H/o stage IIIB NSCLCA completed rx 2015; felt not strong enough for surgery therapy  Prior PE Anemia of chronic disease.  P:  Trend cbc See pulm section  Will need life long anticoagulation   INFECTIOUS A: No acute  P:  Trend CBC and fever curve   ENDOCRINE A:  Hyperglycemia  P:  ssi   NEUROLOGIC A:  No acute  P:  RASS goal: 0 Supportive care   FAMILY  - Updates: Wife at bedside. He does request DNR status but wants treatment - Inter-disciplinary family meet or Palliative Care meeting due by: 9/31  TODAY'S SUMMARY:  Massive PE with hypotension, tachycardia and troponin rise. S/p systemic TPA>> Improved hemodynamics.  Critical care time- 35 mins  Marshell Garfinkel MD [facility] Pager [phone] If no answer or after 3pm call: [phone] 09/09/2015, 9:39 AM

## 2015-09-09 NOTE — Progress Notes (Signed)
ANTICOAGULATION CONSULT NOTE - Follow Up   Pharmacy Consult for Heparin Indication: Pulmonary Embolism s/p tPA  No Known Allergies  Patient Measurements: Height: '5\' 8"'$  (172.7 cm) Weight: 170 lb 3.1 oz (77.2 kg) IBW/kg (Calculated) : 68.4   Vital Signs: Temp: 98.9 F (37.2 C) (09/25 0400) Temp Source: Oral (09/25 0139) BP: 105/70 mmHg (09/25 0630) Pulse Rate: 96 (09/25 0630)  Labs:  Recent Labs  09/08/15 2053 09/08/15 2330 09/09/15 0220 09/09/15 0520  HGB 12.5*  --  10.6* 10.7*  HCT 38.1*  --  32.0* 32.2*  PLT 148*  --  125* 142*  APTT  --  30 35 118*  LABPROT  --  15.4* 16.2* 32.5*  INR  --  1.20 1.28 3.25*  CREATININE 0.97  --  0.86  --   TROPONINI 0.22*  --   --   --     Estimated Creatinine Clearance: 79.5 mL/min (by C-G formula based on Cr of 0.86).   Medical History: Past Medical History  Diagnosis Date  . DM (diabetes mellitus)   . Pneumonia   . Hypercholesteremia   . Melanoma   . Radiation 08/03/14-08/23/14    35 gray to right chest  . Lung cancer   . FH: chemotherapy   . Gastroparesis 02/09/2015  . Cholecystoduodenal fistula   . Pulmonary embolus   . Duodenal ulcer   . COPD (chronic obstructive pulmonary disease)   . Protein calorie malnutrition   . Elevated LFTs   . C. difficile enteritis   . Anemia     Medications:  Scheduled:  . atorvastatin  80 mg Oral QHS  . budesonide-formoterol  2 puff Inhalation BID  . DULoxetine  20 mg Oral Daily  . fentaNYL  75 mcg Transdermal Q3 days  . [START ON 09/10/2015] Influenza vac split quadrivalent PF  0.5 mL Intramuscular Tomorrow-1000  . insulin aspart  0-9 Units Subcutaneous 6 times per day  . pantoprazole  40 mg Oral BID AC  . saccharomyces boulardii  250 mg Oral BID   Infusions:  . lactated ringers 100 mL/hr at 09/09/15 0200    Assessment:  69 yr male with complaint of SOB.  H/O lung cancer (currently not undergoing treatment) and h/o pulmonary embolism (no longer on anticoagulation due to  recent GI bleed)  CTAngio = + PE demonstrated in the left main and multiple segmental branches of the left pulmonary artery is well a subsegmental branches on the right.  Patient received tPA '100mg'$  IV x 1 given over 2 hrs  APTT drawn within 30 min of completion of tPA = 118 sec  Goal of Therapy:  Heparin Level: 0.3 - 0.5 (for the first 24 hr after tPA) then can increase goal to 0.3-0.7 Monitor platelets by anticoagulation protocol: Yes   Plan:   Phamacy consulted to resume IV heparin therapy for PE s/p tPA once aPTT < 80 sec  Will monitor aPTT per protocol (q1-2 hrs) and resume heparin without bolus once aPTT < 80 sec.  Poindexter, Toribio Harbour, PharmD 09/09/2015,6:41 AM

## 2015-09-09 NOTE — ED Notes (Signed)
Attempted to call report to CCU

## 2015-09-09 NOTE — Progress Notes (Signed)
Utilization review completed.  

## 2015-09-09 NOTE — Progress Notes (Signed)
  Echocardiogram 2D Echocardiogram has been performed.  Daryl Vega 09/09/2015, 11:14 AM

## 2015-09-09 NOTE — H&P (Deleted)
PULMONARY / CRITICAL CARE MEDICINE   Name: Daryl Vega MRN: [id number] DOB: 10-08-1946    ADMISSION DATE:  09/08/2015 CONSULTATION DATE:  9/24  REFERRING MD :  Alvino Chapel   CHIEF COMPLAINT:  Pulmonary emboli   INITIAL PRESENTATION:  69 year old male w/ h/o Non-small cell lung cancer stage IIIB (no treatment since 2015 d/t deconditioning). Admitted on 9/25 w/ Acute submassive PE, ongoing hypotension and troponin rise. PCCM asked to admit   STUDIES:  CT chest 9/25: Positive for pulmonary emboli demonstrated in the left main and multiple segmental branches of the left pulmonary artery is well a subsegmental branches on the right. Elevated RV to LV ratio consistent with right heart strain. Small pleural effusions, decreasing since previous study. Atelectasis in the lung bases. Small pericardial effusion. Postradiation changes in the right upper lung. ECHO 9/25:>>> LE dopplers 9/25> DVT noted in the left femoral and popliteal veins.  SIGNIFICANT EVENTS: 9/25 s/p systemic TPA  HISTORY OF PRESENT ILLNESS:   69 year old male w/ sg h/o NSCLC state IIIB . Completed rx 2015 and not treated for his cancer since d/t deconditioning after extensive abd surgery for bleeding duodenal ulcer, ultimately underwent: oversewing of a posterior bleeding duodenal ulcer, pyloroplasty, cholecystectomy, repair of right hepatic artery, repair of incidental cholecystoduodenal fistula and placement of a duodenostomy tube and feeding jejunostomy. These have since been removed. This was c/b PE after he was discharged from this.  Was at home, felt short of breath and fell down. Was able to make it to the restroom. EMS called. SPO2 on arrival 82% on room air. Responded to 6 liters [place]. CT scan showed large PE w/ RV top LV ratio of 2. PCCM asked to eval given on-going hypotension and troponin rise.   PAST MEDICAL HISTORY :   has a past medical history of DM (diabetes mellitus); Pneumonia; Hypercholesteremia; Melanoma;  Radiation (08/03/14-08/23/14); Lung cancer; FH: chemotherapy; Gastroparesis (02/09/2015); Cholecystoduodenal fistula; Pulmonary embolus; Duodenal ulcer; COPD (chronic obstructive pulmonary disease); Protein calorie malnutrition; Elevated LFTs; C. difficile enteritis; and Anemia.  has past surgical history that includes Video bronchoscopy (Bilateral, 07/20/2014); Esophagogastroduodenoscopy (N/A, 11/30/2014); laparotomy (N/A, 11/30/2014); Pyloroplasty; Cholecystectomy; duodenostomy tube; and Jejunostomy feeding tube. Prior to Admission medications   Medication Sig Start Date End Date Taking? Authorizing Provider  atorvastatin (LIPITOR) 80 MG tablet Take 80 mg by mouth at bedtime.    Yes Historical Provider, MD  budesonide-formoterol (SYMBICORT) 160-4.5 MCG/ACT inhaler Take 2 puffs first thing in am and then another 2 puffs about 12 hours later. Patient taking differently: Inhale 2 puffs into the lungs every 12 (twelve) hours as needed.  01/11/15  Yes Venetia Maxon Rama, MD  calcium citrate-vitamin D (CITRACAL+D) 315-200 MG-UNIT per tablet Take 1 tablet by mouth at bedtime.    Yes Historical Provider, MD  Dapagliflozin Propanediol (FARXIGA) 5 MG TABS Take 5 mg by mouth 2 (two) times daily.   Yes Historical Provider, MD  DULoxetine (CYMBALTA) 20 MG capsule Take 1 capsule (20 mg total) by mouth daily. 01/11/15  Yes Christina P Rama, MD  fentaNYL (DURAGESIC - DOSED MCG/HR) 75 MCG/HR Place 1 patch onto the skin every 3 (three) days. 07/25/15  Yes Historical Provider, MD  glimepiride (AMARYL) 4 MG tablet Take 4 mg by mouth at bedtime. Take before meals   Yes Historical Provider, MD  HYDROmorphone (DILAUDID) 4 MG tablet Take 4 mg by mouth every 8 (eight) hours as needed for severe pain.   Yes Historical Provider, MD  LORazepam (ATIVAN) 0.5 MG  tablet Take 1 table by mouth every 12 hours as needed for anxiety 12/19/14  Yes Nishant Dhungel, MD  metFORMIN (GLUCOPHAGE) 500 MG tablet Take 500 mg by mouth 2 (two) times daily with  a meal.   Yes Historical Provider, MD  Multiple Vitamins-Iron (MULTIVITAMINS WITH IRON) TABS tablet Take 1 tablet by mouth daily after supper. 12/19/14  Yes Nishant Dhungel, MD  pantoprazole (PROTONIX) 40 MG tablet Take 1 tablet (40 mg total) by mouth 2 (two) times daily. 12/19/14  Yes Nishant Dhungel, MD  potassium chloride SA (K-DUR,KLOR-CON) 20 MEQ tablet Take 2 tablets (40 mEq total) by mouth daily. 12/19/14  Yes Nishant Dhungel, MD  prochlorperazine (COMPAZINE) 10 MG tablet Take 1 tablet (10 mg total) by mouth every 6 (six) hours as needed for nausea or vomiting. 02/09/15  Yes Earnstine Regal, PA-C  saccharomyces boulardii (FLORASTOR) 250 MG capsule Take 250 mg by mouth 2 (two) times daily.   Yes Historical Provider, MD  temazepam (RESTORIL) 15 MG capsule Take 15 mg by mouth at bedtime as needed for sleep.   Yes Historical Provider, MD  bismuth subsalicylate (PEPTO BISMOL) 262 MG/15ML suspension Place 30 mLs into feeding tube every 4 (four) hours as needed for indigestion. 01/11/15   Venetia Maxon Rama, MD  Na Sulfate-K Sulfate-Mg Sulf SOLN Suprep-Use as directed Patient not taking: Reported on 09/08/2015 08/03/15   [name], MD   No Known Allergies  FAMILY HISTORY:  has no family status information on file.  SOCIAL HISTORY:  reports that he quit smoking about 14 months ago. His smoking use included Cigarettes and Cigars. He has a 100 pack-year smoking history. He quit smokeless tobacco use about 3 years ago. His smokeless tobacco use included Chew. He reports that he does not drink alcohol or use illicit drugs.   SUBJECTIVE:  Feels well. No dyspnea, chest pain.  VITAL SIGNS: Temp:  [98.3 F (36.8 C)-98.9 F (37.2 C)] 98.3 F (36.8 C) (09/25 0800) Pulse Rate:  [92-125] 92 (09/25 0800) Resp:  [0-34] 26 (09/25 0800) BP: (84-110)/(46-75) 99/71 mmHg (09/25 0800) SpO2:  [85 %-100 %] 99 % (09/25 0830) FiO2 (%):  [28 %] 28 % (09/25 0830) Weight:  [168 lb (76.204 kg)-170 lb 3.1 oz (77.2 kg)]  170 lb 3.1 oz (77.2 kg) (09/25 0145) HEMODYNAMICS:   VENTILATOR SETTINGS: Vent Mode:  [-]  FiO2 (%):  [28 %] 28 % INTAKE / OUTPUT:  Intake/Output Summary (Last 24 hours) at 09/09/15 0939 Last data filed at 09/09/15 0800  Gross per 24 hour  Intake    895 ml  Output    900 ml  Net     -5 ml    PHYSICAL EXAMINATION: General:  Awake, alert, no distress Neuro:  No focal deficits HEENT:  PERRL, moist mucus membranes Cardiovascular:  RRR, No MRG Lungs:  Clear antr Abdomen:  Soft, + BS  Skin:  No edema   LABS:  CBC  Recent Labs Lab 09/08/15 2053 09/09/15 0220 09/09/15 0520  WBC 7.8 5.1 6.8  HGB 12.5* 10.6* 10.7*  HCT 38.1* 32.0* 32.2*  PLT 148* 125* 142*   Coag's  Recent Labs Lab 09/08/15 2330 09/09/15 0220 09/09/15 0520 09/09/15 0738  APTT 30 35 118* 77*  INR 1.20 1.28 3.25*  --    BMET  Recent Labs Lab 09/08/15 2053 09/09/15 0220  NA 139 141  K 3.9 3.7  CL 107 109  CO2 24 22  BUN 12 10  CREATININE 0.97 0.86  GLUCOSE 195* 181*  Electrolytes  Recent Labs Lab 09/08/15 2053 09/09/15 0220  CALCIUM 8.9 8.1*  MG  --  1.5*  PHOS  --  3.0   Sepsis Markers No results for input(s): LATICACIDVEN, PROCALCITON, O2SATVEN in the last 168 hours. ABG No results for input(s): PHART, PCO2ART, PO2ART in the last 168 hours. Liver Enzymes  Recent Labs Lab 09/08/15 2053  AST 19  ALT 13*  ALKPHOS 61  BILITOT 0.4  ALBUMIN 3.9   Cardiac Enzymes  Recent Labs Lab 09/08/15 2053  TROPONINI 0.22*   Glucose  Recent Labs Lab 09/09/15 0304 09/09/15 0752  GLUCAP 141* 109*    ASSESSMENT / PLAN:  PULMONARY OETT A: Acute hypoxic respiratory failure (initial sats in 80s on room air) Acute and recurrent Pulmonary embolism is setting of known lung cancer: PESI score 4; RV/LV ratio 2 P:   Hemodynamics have improved after TPA. On heparin anticoagulation Follow echo  CARDIOVASCULAR CVL A:  ST Elevated troponin in setting of PE  P:   Echocardiogram  RENAL A:   No acute  P:   IV hydration  Avoid hypotension   GASTROINTESTINAL A:   GERD Had h/o extensive abd surgery for bleeding duodenal ulcer, ultimately underwent: oversewing of a posterior bleeding duodenal ulcer, pyloroplasty, cholecystectomy, repair of right hepatic artery, repair of incidental cholecystoduodenal fistula and placement of a duodenostomy tube and feeding jejunostomy. These have since been removed.  P:   Start PO diet Cont PPI   HEMATOLOGIC A:   H/o stage IIIB NSCLCA completed rx 2015; felt not strong enough for surgery therapy  Prior PE Anemia of chronic disease.  P:  Trend cbc See pulm section  Will need life long anticoagulation   INFECTIOUS A:  No acute  P:   Trend CBC and fever curve   ENDOCRINE A:   Hyperglycemia    P:   ssi   NEUROLOGIC A:   No acute  P:   RASS goal: 0 Supportive care   FAMILY  - Updates: Wife at bedside. He does request DNR status but wants treatment - Inter-disciplinary family meet or Palliative Care meeting due by:  9/31  TODAY'S SUMMARY:  Massive PE with hypotension, tachycardia and troponin rise. S/p systemic TPA>>  Improved hemodynamics.  Critical care time- 35 mins  Marshell Garfinkel MD [facility] Pager [phone] If no answer or after 3pm call: [phone] 09/09/2015, 9:39 AM

## 2015-09-09 NOTE — Progress Notes (Signed)
ANTICOAGULATION CONSULT NOTE - Follow Up   Pharmacy Consult for Heparin Indication: Pulmonary Embolism s/p tPA  No Known Allergies  Patient Measurements: Height: '5\' 8"'$  (172.7 cm) Weight: 170 lb 3.1 oz (77.2 kg) IBW/kg (Calculated) : 68.4  Heparin dosing weight: 77 kg  Vital Signs: Temp: 98.4 F (36.9 C) (09/25 1200) Temp Source: Oral (09/25 1200) BP: 95/60 mmHg (09/25 1500) Pulse Rate: 85 (09/25 1500)  Labs:  Recent Labs  09/08/15 2053  09/08/15 2330 09/09/15 0220 09/09/15 0520 09/09/15 0738 09/09/15 1530  HGB 12.5*  --   --  10.6* 10.7*  --   --   HCT 38.1*  --   --  32.0* 32.2*  --   --   PLT 148*  --   --  125* 142*  --   --   APTT  --   < > 30 35 118* 77*  --   LABPROT  --   --  15.4* 16.2* 32.5*  --   --   INR  --   --  1.20 1.28 3.25*  --   --   HEPARINUNFRC  --   --   --   --   --   --  <0.10*  CREATININE 0.97  --   --  0.86  --   --   --   TROPONINI 0.22*  --   --   --   --   --   --   < > = values in this interval not displayed.  Estimated Creatinine Clearance: 79.5 mL/min (by C-G formula based on Cr of 0.86).  Medical History: Past Medical History  Diagnosis Date  . DM (diabetes mellitus)   . Pneumonia   . Hypercholesteremia   . Melanoma   . Radiation 08/03/14-08/23/14    35 gray to right chest  . Lung cancer   . FH: chemotherapy   . Gastroparesis 02/09/2015  . Cholecystoduodenal fistula   . Pulmonary embolus   . Duodenal ulcer   . COPD (chronic obstructive pulmonary disease)   . Protein calorie malnutrition   . Elevated LFTs   . C. difficile enteritis   . Anemia    Medications:  Scheduled:  . antiseptic oral rinse  7 mL Mouth Rinse BID  . atorvastatin  80 mg Oral QHS  . budesonide-formoterol  2 puff Inhalation BID  . DULoxetine  20 mg Oral Daily  . fentaNYL  75 mcg Transdermal Q3 days  . [START ON 09/10/2015] Influenza vac split quadrivalent PF  0.5 mL Intramuscular Tomorrow-1000  . insulin aspart  0-9 Units Subcutaneous 6 times per day   . pantoprazole  40 mg Oral BID AC  . saccharomyces boulardii  250 mg Oral BID   Infusions:  . heparin 800 Units/hr (09/09/15 0914)  . lactated ringers 100 mL/hr at 09/09/15 0800   Assessment: 69 yr male with complaint of SOB.  H/O lung cancer (currently not undergoing treatment) and h/o pulmonary embolism (no longer on anticoagulation due to recent GI bleed)  CTAngio = + PE demonstrated in the left main and multiple segmental branches of the left pulmonary artery and subsegmental branches on the right.  Patient received tPA '100mg'$  IV x 1 given over 2 hrs ~ 3am 9/25  APTT drawn within 30 min of completion of tPA = 118 sec  Follow up aPTT @ 0730 = 77 seconds, plan to resume heparin without bolus once aPTT < 80 sec  Goal of Therapy:  Heparin Level: 0.3 - 0.5 (  for the first 24 hr after tPA) then can increase goal to 0.3-0.7 Monitor platelets by anticoagulation protocol: Yes   Plan:   Phamacy consulted to resume IV heparin therapy for PE s/p tPA once aPTT < 80 sec  Heparin infusion 800 units/hr  Check 1st Heparin level in 6 hr  Minda Ditto PharmD Pager [phone] 09/09/2015, 4:30 PM   Addendum  1st Heparin level at 1530 = < 0.1 units/ml, aiming for 0.3-0.5  Increase Heparin rate to 1100 units/hr, recheck level in 6 hr  No bleeding, no respiratory distress  Minda Ditto PharmD Pager [phone] 09/09/2015, 4:43 PM

## 2015-09-09 NOTE — Progress Notes (Signed)
*  PRELIMINARY RESULTS* Vascular Ultrasound Lower extremity venous duplex has been completed.  Preliminary findings: DVT noted in the left femoral and popliteal veins. No DVT RLE.   Landry Mellow, RDMS, RVT  09/09/2015, 8:23 AM

## 2015-09-09 NOTE — Progress Notes (Addendum)
[facility] Progress Note Patient Name: Daryl Vega DOB: July 15, 1946 MRN: [id number]   Date of Service  09/09/2015  HPI/Events of Note  Head CT Scan is negative for acute intracranial bleeding.   eICU Interventions  OK to give tPA as ordered     Intervention Category Intermediate Interventions: Diagnostic test evaluation  Sommer,Steven Eugene 09/09/2015, 1:53 AM

## 2015-09-09 NOTE — Progress Notes (Signed)
ANTICOAGULATION CONSULT NOTE - Follow Up   Pharmacy Consult for Heparin Indication: Pulmonary Embolism s/p tPA  No Known Allergies  Patient Measurements: Height: '5\' 8"'$  (172.7 cm) Weight: 170 lb 3.1 oz (77.2 kg) IBW/kg (Calculated) : 68.4  Heparin dosing weight: 77 kg  Vital Signs: Temp: 98.3 F (36.8 C) (09/25 0800) Temp Source: Oral (09/25 0800) BP: 99/71 mmHg (09/25 0800) Pulse Rate: 92 (09/25 0800)  Labs:  Recent Labs  09/08/15 2053  09/08/15 2330 09/09/15 0220 09/09/15 0520 09/09/15 0738  HGB 12.5*  --   --  10.6* 10.7*  --   HCT 38.1*  --   --  32.0* 32.2*  --   PLT 148*  --   --  125* 142*  --   APTT  --   < > 30 35 118* 77*  LABPROT  --   --  15.4* 16.2* 32.5*  --   INR  --   --  1.20 1.28 3.25*  --   CREATININE 0.97  --   --  0.86  --   --   TROPONINI 0.22*  --   --   --   --   --   < > = values in this interval not displayed.  Estimated Creatinine Clearance: 79.5 mL/min (by C-G formula based on Cr of 0.86).  Medical History: Past Medical History  Diagnosis Date  . DM (diabetes mellitus)   . Pneumonia   . Hypercholesteremia   . Melanoma   . Radiation 08/03/14-08/23/14    35 gray to right chest  . Lung cancer   . FH: chemotherapy   . Gastroparesis 02/09/2015  . Cholecystoduodenal fistula   . Pulmonary embolus   . Duodenal ulcer   . COPD (chronic obstructive pulmonary disease)   . Protein calorie malnutrition   . Elevated LFTs   . C. difficile enteritis   . Anemia    Medications:  Scheduled:  . antiseptic oral rinse  7 mL Mouth Rinse BID  . atorvastatin  80 mg Oral QHS  . budesonide-formoterol  2 puff Inhalation BID  . DULoxetine  20 mg Oral Daily  . fentaNYL  75 mcg Transdermal Q3 days  . [START ON 09/10/2015] Influenza vac split quadrivalent PF  0.5 mL Intramuscular Tomorrow-1000  . insulin aspart  0-9 Units Subcutaneous 6 times per day  . pantoprazole  40 mg Oral BID AC  . saccharomyces boulardii  250 mg Oral BID   Infusions:  . lactated  ringers 100 mL/hr at 09/09/15 0800   Assessment: 69 yr male with complaint of SOB.  H/O lung cancer (currently not undergoing treatment) and h/o pulmonary embolism (no longer on anticoagulation due to recent GI bleed)  CTAngio = + PE demonstrated in the left main and multiple segmental branches of the left pulmonary artery and subsegmental branches on the right.  Patient received tPA '100mg'$  IV x 1 given over 2 hrs ~ 3am 9/25  APTT drawn within 30 min of completion of tPA = 118 sec  Follow up aPTT @ 0730 = 77 seconds, plan to resume heparin without bolus once aPTT < 80 sec  Goal of Therapy:  Heparin Level: 0.3 - 0.5 (for the first 24 hr after tPA) then can increase goal to 0.3-0.7 Monitor platelets by anticoagulation protocol: Yes   Plan:   Phamacy consulted to resume IV heparin therapy for PE s/p tPA once aPTT < 80 sec  Heparin infusion 800 units/hr  Check 1st Heparin level in 6 hr  Minda Ditto PharmD Pager [phone] 09/09/2015, 9:07 AM

## 2015-09-10 ENCOUNTER — Inpatient Hospital Stay (HOSPITAL_COMMUNITY): Payer: Medicare Other

## 2015-09-10 DIAGNOSIS — I2699 Other pulmonary embolism without acute cor pulmonale: Secondary | ICD-10-CM | POA: Diagnosis not present

## 2015-09-10 LAB — CBC
HCT: 31.2 % — ABNORMAL LOW (ref 39.0–52.0)
Hemoglobin: 10.4 g/dL — ABNORMAL LOW (ref 13.0–17.0)
MCH: 30.1 pg (ref 26.0–34.0)
MCHC: 33.3 g/dL (ref 30.0–36.0)
MCV: 90.2 fL (ref 78.0–100.0)
PLATELETS: 126 10*3/uL — AB (ref 150–400)
RBC: 3.46 MIL/uL — AB (ref 4.22–5.81)
RDW: 13.7 % (ref 11.5–15.5)
WBC: 4.8 10*3/uL (ref 4.0–10.5)

## 2015-09-10 LAB — HEPARIN LEVEL (UNFRACTIONATED)
HEPARIN UNFRACTIONATED: 0.26 [IU]/mL — AB (ref 0.30–0.70)
HEPARIN UNFRACTIONATED: 0.35 [IU]/mL (ref 0.30–0.70)
Heparin Unfractionated: 0.35 IU/mL (ref 0.30–0.70)

## 2015-09-10 LAB — GLUCOSE, CAPILLARY
GLUCOSE-CAPILLARY: 108 mg/dL — AB (ref 65–99)
Glucose-Capillary: 138 mg/dL — ABNORMAL HIGH (ref 65–99)
Glucose-Capillary: 103 mg/dL — ABNORMAL HIGH (ref 65–99)
Glucose-Capillary: 98 mg/dL (ref 65–99)

## 2015-09-10 LAB — PROTIME-INR
INR: 1.17 (ref 0.00–1.49)
Prothrombin Time: 15.1 seconds (ref 11.6–15.2)

## 2015-09-10 MED ORDER — WARFARIN VIDEO: Freq: Once | Status: DC

## 2015-09-10 MED ORDER — WARFARIN - PHARMACIST DOSING INPATIENT: Freq: Every day | Status: DC

## 2015-09-10 MED ORDER — ONDANSETRON HCL 4 MG/2ML IJ SOLN
4.0000 mg | Freq: Four times a day (QID) | INTRAMUSCULAR | Status: DC | PRN
  Administered 2015-09-10 – 2015-09-11 (×3): 4 mg via INTRAVENOUS
  Filled 2015-09-10 (×3): qty 2

## 2015-09-10 MED ORDER — COUMADIN BOOK
Freq: Once | Status: AC
  Administered 2015-09-10: 18:00:00
  Filled 2015-09-10: qty 1

## 2015-09-10 MED ORDER — HEPARIN (PORCINE) IN NACL 100-0.45 UNIT/ML-% IJ SOLN
1200.0000 [IU]/h | INTRAMUSCULAR | Status: DC
  Administered 2015-09-10 – 2015-09-11 (×2): 1200 [IU]/h via INTRAVENOUS
  Filled 2015-09-10 (×4): qty 250

## 2015-09-10 MED ORDER — WARFARIN SODIUM 5 MG PO TABS
7.5000 mg | ORAL_TABLET | Freq: Once | ORAL | Status: DC
  Filled 2015-09-10: qty 1.5

## 2015-09-10 NOTE — Progress Notes (Signed)
ANTICOAGULATION CONSULT NOTE - Follow Up   Pharmacy Consult for Heparin Indication: Pulmonary Embolism s/p tPA  No Known Allergies  Patient Measurements: Height: '5\' 8"'$  (172.7 cm) Weight: 170 lb 3.1 oz (77.2 kg) IBW/kg (Calculated) : 68.4  Heparin dosing weight: 77 kg  Vital Signs: Temp: 98.3 F (36.8 C) (09/25 2000) Temp Source: Oral (09/25 2000) BP: 115/65 mmHg (09/26 0000) Pulse Rate: 86 (09/26 0000)  Labs:  Recent Labs  09/08/15 2053  09/08/15 2330 09/09/15 0220 09/09/15 0520 09/09/15 0738 09/09/15 1530 09/09/15 2325  HGB 12.5*  --   --  10.6* 10.7*  --   --   --   HCT 38.1*  --   --  32.0* 32.2*  --   --   --   PLT 148*  --   --  125* 142*  --   --   --   APTT  --   < > 30 35 118* 77*  --   --   LABPROT  --   --  15.4* 16.2* 32.5*  --   --   --   INR  --   --  1.20 1.28 3.25*  --   --   --   HEPARINUNFRC  --   --   --   --   --   --  <0.10* 0.26*  CREATININE 0.97  --   --  0.86  --   --   --   --   TROPONINI 0.22*  --   --   --   --   --   --   --   < > = values in this interval not displayed.  Estimated Creatinine Clearance: 79.5 mL/min (by C-G formula based on Cr of 0.86).  Medical History: Past Medical History  Diagnosis Date  . DM (diabetes mellitus)   . Pneumonia   . Hypercholesteremia   . Melanoma   . Radiation 08/03/14-08/23/14    35 gray to right chest  . Lung cancer   . FH: chemotherapy   . Gastroparesis 02/09/2015  . Cholecystoduodenal fistula   . Pulmonary embolus   . Duodenal ulcer   . COPD (chronic obstructive pulmonary disease)   . Protein calorie malnutrition   . Elevated LFTs   . C. difficile enteritis   . Anemia    Medications:  Scheduled:  . antiseptic oral rinse  7 mL Mouth Rinse BID  . atorvastatin  80 mg Oral QHS  . budesonide-formoterol  2 puff Inhalation BID  . DULoxetine  20 mg Oral Daily  . fentaNYL  75 mcg Transdermal Q3 days  . Influenza vac split quadrivalent PF  0.5 mL Intramuscular Tomorrow-1000  . insulin aspart   0-5 Units Subcutaneous QHS  . insulin aspart  0-9 Units Subcutaneous TID WC  . pantoprazole  40 mg Oral BID AC  . saccharomyces boulardii  250 mg Oral BID   Infusions:  . heparin 1,100 Units/hr (09/09/15 1638)  . lactated ringers 100 mL/hr at 09/09/15 2144   Assessment: 69 yr male with complaint of SOB.  H/O lung cancer (currently not undergoing treatment) and h/o pulmonary embolism (no longer on anticoagulation due to recent GI bleed)  CTAngio = + PE demonstrated in the left main and multiple segmental branches of the left pulmonary artery and subsegmental branches on the right.  Patient received tPA '100mg'$  IV x 1 given over 2 hrs ~ 3am 9/25 and heparin was resumed once aPTT < 80 sec  Heparin level =  0.26 on heparin @ 1100 units/hr  No complications of therapy noted  Goal of Therapy:  Heparin Level: 0.3 - 0.5 (for the first 24 hr after tPA) then can increase goal to 0.3-0.7 Monitor platelets by anticoagulation protocol: Yes   Plan:   Increase IV heparin to 1200 units/hr  Check heparin level 6 hr after heparin rate increased  Follow CBC  Leone Haven, PharmD  09/10/2015, 12:21 AM

## 2015-09-10 NOTE — H&P (Signed)
PULMONARY / CRITICAL CARE MEDICINE   Name: Daryl Vega MRN: [id number] DOB: 1946/10/12    ADMISSION DATE:  09/08/2015 CONSULTATION DATE:  9/24  REFERRING MD :  Alvino Chapel   CHIEF COMPLAINT:  Pulmonary emboli   INITIAL PRESENTATION:  69 year old male w/ h/o Non-small cell lung cancer stage IIIB (no treatment since 2015 d/t deconditioning). Admitted on 9/25 w/ Acute submassive PE, ongoing hypotension and troponin rise. PCCM asked to admit   STUDIES:  CT chest 9/25: Positive for pulmonary emboli demonstrated in the left main and multiple segmental branches of the left pulmonary artery is well a subsegmental branches on the right. Elevated RV to LV ratio consistent with right heart strain. Small pleural effusions, decreasing since previous study. Atelectasis in the lung bases. Small pericardial effusion. Postradiation changes in the right upper lung. ECHO 9/25:>>> RV dilation with pressure and volume overload. LE dopplers 9/25>>> DVT noted in the left femoral and popliteal veins.  SIGNIFICANT EVENTS: 9/25. S/P systemic TPA   HISTORY OF PRESENT ILLNESS:   69 year old male w/ sg h/o NSCLC state IIIB . Completed rx 2015 and not treated for his cancer since d/t deconditioning after extensive abd surgery for bleeding duodenal ulcer, ultimately underwent: oversewing of a posterior bleeding duodenal ulcer, pyloroplasty, cholecystectomy, repair of right hepatic artery, repair of incidental cholecystoduodenal fistula and placement of a duodenostomy tube and feeding jejunostomy. These have since been removed. This was c/b PE after he was discharged from this.  Was at home, felt short of breath and fell down. Was able to make it to the restroom. EMS called. SPO2 on arrival 82% on room air. Responded to 6 liters [place]. CT scan showed large PE w/ RV top LV ratio of 2. PCCM asked to eval given on-going hypotension and troponin rise.   PAST MEDICAL HISTORY :   has a past medical history of DM (diabetes  mellitus); Pneumonia; Hypercholesteremia; Melanoma; Radiation (08/03/14-08/23/14); Lung cancer; FH: chemotherapy; Gastroparesis (02/09/2015); Cholecystoduodenal fistula; Pulmonary embolus; Duodenal ulcer; COPD (chronic obstructive pulmonary disease); Protein calorie malnutrition; Elevated LFTs; C. difficile enteritis; and Anemia.  has past surgical history that includes Video bronchoscopy (Bilateral, 07/20/2014); Esophagogastroduodenoscopy (N/A, 11/30/2014); laparotomy (N/A, 11/30/2014); Pyloroplasty; Cholecystectomy; duodenostomy tube; and Jejunostomy feeding tube. Prior to Admission medications   Medication Sig Start Date End Date Taking? Authorizing Provider  atorvastatin (LIPITOR) 80 MG tablet Take 80 mg by mouth at bedtime.    Yes Historical Provider, MD  budesonide-formoterol (SYMBICORT) 160-4.5 MCG/ACT inhaler Take 2 puffs first thing in am and then another 2 puffs about 12 hours later. Patient taking differently: Inhale 2 puffs into the lungs every 12 (twelve) hours as needed.  01/11/15  Yes Venetia Maxon Rama, MD  calcium citrate-vitamin D (CITRACAL+D) 315-200 MG-UNIT per tablet Take 1 tablet by mouth at bedtime.    Yes Historical Provider, MD  Dapagliflozin Propanediol (FARXIGA) 5 MG TABS Take 5 mg by mouth 2 (two) times daily.   Yes Historical Provider, MD  DULoxetine (CYMBALTA) 20 MG capsule Take 1 capsule (20 mg total) by mouth daily. 01/11/15  Yes Christina P Rama, MD  fentaNYL (DURAGESIC - DOSED MCG/HR) 75 MCG/HR Place 1 patch onto the skin every 3 (three) days. 07/25/15  Yes Historical Provider, MD  glimepiride (AMARYL) 4 MG tablet Take 4 mg by mouth at bedtime. Take before meals   Yes Historical Provider, MD  HYDROmorphone (DILAUDID) 4 MG tablet Take 4 mg by mouth every 8 (eight) hours as needed for severe pain.   Yes  Historical Provider, MD  LORazepam (ATIVAN) 0.5 MG tablet Take 1 table by mouth every 12 hours as needed for anxiety 12/19/14  Yes Nishant Dhungel, MD  metFORMIN (GLUCOPHAGE) 500 MG  tablet Take 500 mg by mouth 2 (two) times daily with a meal.   Yes Historical Provider, MD  Multiple Vitamins-Iron (MULTIVITAMINS WITH IRON) TABS tablet Take 1 tablet by mouth daily after supper. 12/19/14  Yes Nishant Dhungel, MD  pantoprazole (PROTONIX) 40 MG tablet Take 1 tablet (40 mg total) by mouth 2 (two) times daily. 12/19/14  Yes Nishant Dhungel, MD  potassium chloride SA (K-DUR,KLOR-CON) 20 MEQ tablet Take 2 tablets (40 mEq total) by mouth daily. 12/19/14  Yes Nishant Dhungel, MD  prochlorperazine (COMPAZINE) 10 MG tablet Take 1 tablet (10 mg total) by mouth every 6 (six) hours as needed for nausea or vomiting. 02/09/15  Yes Earnstine Regal, PA-C  saccharomyces boulardii (FLORASTOR) 250 MG capsule Take 250 mg by mouth 2 (two) times daily.   Yes Historical Provider, MD  temazepam (RESTORIL) 15 MG capsule Take 15 mg by mouth at bedtime as needed for sleep.   Yes Historical Provider, MD  bismuth subsalicylate (PEPTO BISMOL) 262 MG/15ML suspension Place 30 mLs into feeding tube every 4 (four) hours as needed for indigestion. 01/11/15   Venetia Maxon Rama, MD  Na Sulfate-K Sulfate-Mg Sulf SOLN Suprep-Use as directed Patient not taking: Reported on 09/08/2015 08/03/15   [name], MD   No Known Allergies  FAMILY HISTORY:  has no family status information on file.  SOCIAL HISTORY:  reports that he quit smoking about 14 months ago. His smoking use included Cigarettes and Cigars. He has a 100 pack-year smoking history. He quit smokeless tobacco use about 3 years ago. His smokeless tobacco use included Chew. He reports that he does not drink alcohol or use illicit drugs.  REVIEW OF SYSTEMS:   HENT: no HA, nasal congestion, sore throat. Pulm: no cough, + SOB, no wheeze, no CP. Card: no CP no palps. EXT: no pain. No swelling Abd: no pain, NVD, GU: no acute Neuro: no acute   SUBJECTIVE:  C/O nausea and vomiting. Constipated for several days.  VITAL SIGNS: Temp:  [98.1 F (36.7 C)-98.4 F (36.9 C)]  98.1 F (36.7 C) (09/26 0800) Pulse Rate:  [77-98] 98 (09/26 0800) Resp:  [11-32] 17 (09/26 0800) BP: (95-135)/(59-78) 135/74 mmHg (09/26 0800) SpO2:  [94 %-99 %] 97 % (09/26 0800) HEMODYNAMICS:   VENTILATOR SETTINGS:   INTAKE / OUTPUT:  Intake/Output Summary (Last 24 hours) at 09/10/15 1007 Last data filed at 09/10/15 0800  Gross per 24 hour  Intake 2617.43 ml  Output   1150 ml  Net 1467.43 ml    PHYSICAL EXAMINATION: General:  Awake, alert, not in distress.  Neuro:  Awake, alert no focal def  HEENT:  NCAT, no JVD  Cardiovascular:  Tachy rrr  Lungs:  Crackles both bases. No accessory muscle use  Abdomen:  Soft, not tender. Healed surgical wounds  Musculoskeletal:  Intact  Skin:  No sig edema   LABS:  CBC  Recent Labs Lab 09/09/15 0220 09/09/15 0520 09/10/15 0645  WBC 5.1 6.8 4.8  HGB 10.6* 10.7* 10.4*  HCT 32.0* 32.2* 31.2*  PLT 125* 142* 126*   Coag's  Recent Labs Lab 09/08/15 2330 09/09/15 0220 09/09/15 0520 09/09/15 0738  APTT 30 35 118* 77*  INR 1.20 1.28 3.25*  --    BMET  Recent Labs Lab 09/08/15 2053 09/09/15 0220  NA 139  141  K 3.9 3.7  CL 107 109  CO2 24 22  BUN 12 10  CREATININE 0.97 0.86  GLUCOSE 195* 181*   Electrolytes  Recent Labs Lab 09/08/15 2053 09/09/15 0220  CALCIUM 8.9 8.1*  MG  --  1.5*  PHOS  --  3.0   Sepsis Markers No results for input(s): LATICACIDVEN, PROCALCITON, O2SATVEN in the last 168 hours. ABG No results for input(s): PHART, PCO2ART, PO2ART in the last 168 hours. Liver Enzymes  Recent Labs Lab 09/08/15 2053  AST 19  ALT 13*  ALKPHOS 61  BILITOT 0.4  ALBUMIN 3.9   Cardiac Enzymes  Recent Labs Lab 09/08/15 2053  TROPONINI 0.22*   Glucose  Recent Labs Lab 09/09/15 0304 09/09/15 0752 09/09/15 1151 09/09/15 1629 09/09/15 1952 09/10/15 0747  GLUCAP 141* 109* 155* 147* 158* 138*    Imaging No results found.   ASSESSMENT / PLAN:  PULMONARY OETT A: Acute hypoxic  respiratory failure (initial sats in 80s on room air) Acute and recurrent Pulmonary embolism is setting of known lung cancer: PESI score 4; RV/LV ratio 2 P:  Hemodynamics have improved after TPA. On heparin anticoagulation. Convert to Xarelto  CARDIOVASCULAR CVL A:  ST Elevated troponin in setting of PE  P:  Echocardiogram shows RV dilation with pressure and volume overload.  RENAL A:  No acute  P:  IV hydration  Avoid hypotension   GASTROINTESTINAL A:  GERD Had h/o extensive abd surgery for bleeding duodenal ulcer, ultimately underwent: oversewing of a posterior bleeding duodenal ulcer, pyloroplasty, cholecystectomy, repair of right hepatic artery, repair of incidental cholecystoduodenal fistula and placement of a duodenostomy tube and feeding jejunostomy. These have since been removed.  P:  Start PO diet Cont PPI  Zofran for nausea. Check Abd X ray. If OK we can give a bowel regimen.  HEMATOLOGIC A:  H/o stage IIIB NSCLCA completed rx 2015; felt not strong enough for surgery therapy  Prior PE Anemia of chronic disease.  P:  Trend cbc See pulm section  Will need life long anticoagulation   INFECTIOUS A: No acute  P:  Trend CBC and fever curve   ENDOCRINE A:  Hyperglycemia  P:  ssi   NEUROLOGIC A:  No acute  P:  RASS goal: 0 Supportive care   FAMILY  - Updates: Wife at bedside. He does request DNR status but wants treatment - Inter-disciplinary family meet or Palliative Care meeting due by: 9/31  TODAY'S SUMMARY:  Massive PE with hypotension, tachycardia and troponin rise. S/p systemic TPA>> Improved hemodynamics.  Critical care time- 35 mins  Marshell Garfinkel MD [facility] Pager [phone] If no answer or after 3pm call: [phone] 09/09/2015, 9:39 AM

## 2015-09-10 NOTE — Clinical Documentation Improvement (Signed)
Critical Care  Based on the clinical findings below, please document any associated diagnoses/conditions the patient has or may have in the next progress note and include in discharge summary if applicable.   Other  Clinically Undetermined  Supporting Information:  BP's were running 87/55, 88/46 with Maps of 57 to 68  HR > 90, RR > 22  CT reveals large hemodynamically significant PE; TPA administered; RV to LV ratio increased to 2  Acute Hypoxic Respiratory Failure documented  Please exercise your independent, professional judgment when responding. A specific answer is not anticipated or expected.  Thank You, Zoila Shutter BSN, [place] [phone]

## 2015-09-10 NOTE — Progress Notes (Addendum)
ANTICOAGULATION CONSULT NOTE - Follow Up   Pharmacy Consult for Heparin --> Apixaban Indication: Pulmonary Embolism s/p tPA  No Known Allergies  Patient Measurements: Height: '5\' 8"'$  (172.7 cm) Weight: 170 lb 3.1 oz (77.2 kg) IBW/kg (Calculated) : 68.4  Heparin dosing weight: 77 kg  Vital Signs: Temp: 98.1 F (36.7 C) (09/26 0800) Temp Source: Oral (09/26 0800) BP: 135/74 mmHg (09/26 0800) Pulse Rate: 98 (09/26 0800)  Labs:  Recent Labs  09/08/15 2053  09/08/15 2330 09/09/15 0220 09/09/15 0520 09/09/15 0738 09/09/15 1530 09/09/15 2325 09/10/15 0645  HGB 12.5*  --   --  10.6* 10.7*  --   --   --  10.4*  HCT 38.1*  --   --  32.0* 32.2*  --   --   --  31.2*  PLT 148*  --   --  125* 142*  --   --   --  126*  APTT  --   < > 30 35 118* 77*  --   --   --   LABPROT  --   --  15.4* 16.2* 32.5*  --   --   --   --   INR  --   --  1.20 1.28 3.25*  --   --   --   --   HEPARINUNFRC  --   --   --   --   --   --  <0.10* 0.26* 0.35  CREATININE 0.97  --   --  0.86  --   --   --   --   --   TROPONINI 0.22*  --   --   --   --   --   --   --   --   < > = values in this interval not displayed.  Estimated Creatinine Clearance: 79.5 mL/min (by C-G formula based on Cr of 0.86).  Medical History: Past Medical History  Diagnosis Date  . DM (diabetes mellitus)   . Pneumonia   . Hypercholesteremia   . Melanoma   . Radiation 08/03/14-08/23/14    35 gray to right chest  . Lung cancer   . FH: chemotherapy   . Gastroparesis 02/09/2015  . Cholecystoduodenal fistula   . Pulmonary embolus   . Duodenal ulcer   . COPD (chronic obstructive pulmonary disease)   . Protein calorie malnutrition   . Elevated LFTs   . C. difficile enteritis   . Anemia    Medications:  Scheduled:  . antiseptic oral rinse  7 mL Mouth Rinse BID  . atorvastatin  80 mg Oral QHS  . budesonide-formoterol  2 puff Inhalation BID  . DULoxetine  20 mg Oral Daily  . fentaNYL  75 mcg Transdermal Q3 days  . Influenza vac  split quadrivalent PF  0.5 mL Intramuscular Tomorrow-1000  . insulin aspart  0-5 Units Subcutaneous QHS  . insulin aspart  0-9 Units Subcutaneous TID WC  . pantoprazole  40 mg Oral BID AC  . saccharomyces boulardii  250 mg Oral BID   Infusions:  . heparin 1,200 Units/hr (09/10/15 0820)  . lactated ringers 100 mL/hr at 09/10/15 0800   Assessment: 69 yr male with complaint of SOB.  CTAngio demonstrated PE in the left main and multiple segmental branches of the left pulmonary artery and subsegmental branches on the right.  H/O lung cancer (currently not undergoing treatment) and h/o pulmonary embolism (no longer on anticoagulation PTA, was on Lovenox previously).  PMH also significant for GIB  in 11/2014 which required extensive abdominal surgery for bleeding duodenal ulcer.    MD requested initiation of long-term anticoagulation in the setting of repeated pulmonary embolisms.  Patient also has recent history of lung cancer, s/p treatment, with no current malignancy.  Patient's recent history of GI bleeding recommends caution in anticoagulation, favoring a treatment with reversal potential so warfarin was recommended; however, patient and wife prefers to have agent which does not need frequent monitoring so decision made to transition to apixaban for continued treatment of PE.  Significant events: 9/25  tPA '100mg'$  IV x 1 given over 2 hrs ~ 3am 9/25  Heparin was resumed once aPTT < 80 sec 9/26  Patient refused warfarin dose 9/27 Transition heparin infusion to apixaban  Today, 09/11/2015: - Heparin level this AM was subtherapeutic at 0.25 with infusion at 1200 units/hr.  Infusion rate increased to 1350 units/hr. - SCr 0.78, CrCl~85 ml/min, stable - Hgb 10.7, platelets 137K, stable  Goal of Therapy:  VTE Treatment   Plan:  1.  Stop heparin infusion. 2.  Start apixaban 10 mg PO BID x 7 days then on October 4th, start 5 mg PO BID.  Can administer first apixaban dose at time of heparin  discontinuation. 3.  F/u CBC, SCr at least q72h. 4.  Will provide apixaban education prior to discharge.  [name], PharmD, BCPS Pager: [phone] 09/11/2015 11:30 AM

## 2015-09-11 LAB — CBC
HEMATOCRIT: 31.5 % — AB (ref 39.0–52.0)
Hemoglobin: 10.7 g/dL — ABNORMAL LOW (ref 13.0–17.0)
MCH: 30.1 pg (ref 26.0–34.0)
MCHC: 34 g/dL (ref 30.0–36.0)
MCV: 88.7 fL (ref 78.0–100.0)
Platelets: 137 10*3/uL — ABNORMAL LOW (ref 150–400)
RBC: 3.55 MIL/uL — ABNORMAL LOW (ref 4.22–5.81)
RDW: 13.3 % (ref 11.5–15.5)
WBC: 6.9 10*3/uL (ref 4.0–10.5)

## 2015-09-11 LAB — BASIC METABOLIC PANEL
Anion gap: 7 (ref 5–15)
BUN: 6 mg/dL (ref 6–20)
CALCIUM: 8.3 mg/dL — AB (ref 8.9–10.3)
CO2: 23 mmol/L (ref 22–32)
CREATININE: 0.78 mg/dL (ref 0.61–1.24)
Chloride: 106 mmol/L (ref 101–111)
GFR calc Af Amer: >60 mL/min (ref >60)
GFR calc non Af Amer: >60 mL/min (ref >60)
GLUCOSE: 92 mg/dL (ref 65–99)
Potassium: 3.3 mmol/L — ABNORMAL LOW (ref 3.5–5.1)
Sodium: 136 mmol/L (ref 135–145)

## 2015-09-11 LAB — GLUCOSE, CAPILLARY
GLUCOSE-CAPILLARY: 115 mg/dL — AB (ref 65–99)
GLUCOSE-CAPILLARY: 114 mg/dL — AB (ref 65–99)
Glucose-Capillary: 91 mg/dL (ref 65–99)
Glucose-Capillary: 163 mg/dL — ABNORMAL HIGH (ref 65–99)

## 2015-09-11 LAB — PROTIME-INR
INR: 1.26 (ref 0.00–1.49)
Prothrombin Time: 16 seconds — ABNORMAL HIGH (ref 11.6–15.2)

## 2015-09-11 LAB — HEPARIN LEVEL (UNFRACTIONATED): HEPARIN UNFRACTIONATED: 0.25 [IU]/mL — AB (ref 0.30–0.70)

## 2015-09-11 MED ORDER — SENNOSIDES-DOCUSATE SODIUM 8.6-50 MG PO TABS
1.0000 | ORAL_TABLET | Freq: Every day | ORAL | Status: DC
  Administered 2015-09-13: 1 via ORAL
  Filled 2015-09-11: qty 1

## 2015-09-11 MED ORDER — HEPARIN (PORCINE) IN NACL 100-0.45 UNIT/ML-% IJ SOLN
1350.0000 [IU]/h | INTRAMUSCULAR | Status: DC
  Filled 2015-09-11: qty 250

## 2015-09-11 MED ORDER — BISACODYL 10 MG RE SUPP
10.0000 mg | Freq: Every day | RECTAL | Status: DC | PRN
  Administered 2015-09-11: 10 mg via RECTAL
  Filled 2015-09-11: qty 1

## 2015-09-11 MED ORDER — APIXABAN 5 MG PO TABS: 5.0000 mg | ORAL_TABLET | Freq: Two times a day (BID) | ORAL | Status: DC

## 2015-09-11 MED ORDER — POTASSIUM CHLORIDE CRYS ER 20 MEQ PO TBCR
20.0000 meq | EXTENDED_RELEASE_TABLET | ORAL | Status: AC
  Administered 2015-09-11 (×2): 20 meq via ORAL
  Filled 2015-09-11 (×2): qty 1

## 2015-09-11 MED ORDER — LACTATED RINGERS IV SOLN
INTRAVENOUS | Status: DC
  Administered 2015-09-12: 07:00:00 via INTRAVENOUS

## 2015-09-11 MED ORDER — APIXABAN 5 MG PO TABS
10.0000 mg | ORAL_TABLET | Freq: Two times a day (BID) | ORAL | Status: DC
Start: 2015-09-11 — End: 2015-09-13
  Administered 2015-09-11 – 2015-09-13 (×5): 10 mg via ORAL
  Filled 2015-09-11 (×5): qty 2

## 2015-09-11 NOTE — Progress Notes (Signed)
Patient and patient's wife refusing bed alarm. Patient and patient's  wife educated on rational of bed alarm and the pt bleeding related to blood thinners pt has been on and is currently on this hospital stay. Patient and patient wife verbalized understanding of risk and still declined the bed alarm.

## 2015-09-11 NOTE — Progress Notes (Signed)
PULMONARY / CRITICAL CARE MEDICINE   Name: Daryl Vega MRN: [id number] DOB: 10-Jun-1946    ADMISSION DATE:  09/08/2015 CONSULTATION DATE:  9/24  REFERRING MD :  Alvino Chapel   CHIEF COMPLAINT:  Pulmonary emboli   INITIAL PRESENTATION:  69 year old male w/ h/o Non-small cell lung cancer stage IIIB (no treatment since 2015 d/t deconditioning). Admitted on 9/25 w/ Acute submassive PE, ongoing hypotension and troponin rise. PCCM asked to admit   STUDIES:  CT chest 9/25: Positive for pulmonary emboli demonstrated in the left main and multiple segmental branches of the left pulmonary artery is well a subsegmental branches on the right. Elevated RV to LV ratio consistent with right heart strain. Small pleural effusions, decreasing since previous study. Atelectasis in the lung bases. Small pericardial effusion. Postradiation changes in the right upper lung. ECHO 9/25:>>> RV dilation with pressure and volume overload. LE dopplers 9/25>>> DVT noted in the left femoral and popliteal veins. Abd X ray (09/10/15) >> Non obstructive bowel gas pattern,  SIGNIFICANT EVENTS: 9/25. S/P systemic TPA   HISTORY OF PRESENT ILLNESS:   69 year old male w/ sg h/o NSCLC state IIIB . Completed rx 2015 and not treated for his cancer since d/t deconditioning after extensive abd surgery for bleeding duodenal ulcer, ultimately underwent: oversewing of a posterior bleeding duodenal ulcer, pyloroplasty, cholecystectomy, repair of right hepatic artery, repair of incidental cholecystoduodenal fistula and placement of a duodenostomy tube and feeding jejunostomy. These have since been removed. This was c/b PE after he was discharged from this.  Was at home, felt short of breath and fell down. Was able to make it to the restroom. EMS called. SPO2 on arrival 82% on room air. Responded to 6 liters [place]. CT scan showed large PE w/ RV top LV ratio of 2. PCCM asked to eval given on-going hypotension and troponin rise.   PAST MEDICAL  HISTORY :   has a past medical history of DM (diabetes mellitus); Pneumonia; Hypercholesteremia; Melanoma; Radiation (08/03/14-08/23/14); Lung cancer; FH: chemotherapy; Gastroparesis (02/09/2015); Cholecystoduodenal fistula; Pulmonary embolus; Duodenal ulcer; COPD (chronic obstructive pulmonary disease); Protein calorie malnutrition; Elevated LFTs; C. difficile enteritis; and Anemia.  has past surgical history that includes Video bronchoscopy (Bilateral, 07/20/2014); Esophagogastroduodenoscopy (N/A, 11/30/2014); laparotomy (N/A, 11/30/2014); Pyloroplasty; Cholecystectomy; duodenostomy tube; and Jejunostomy feeding tube. Prior to Admission medications   Medication Sig Start Date End Date Taking? Authorizing Provider  atorvastatin (LIPITOR) 80 MG tablet Take 80 mg by mouth at bedtime.    Yes Historical Provider, MD  budesonide-formoterol (SYMBICORT) 160-4.5 MCG/ACT inhaler Take 2 puffs first thing in am and then another 2 puffs about 12 hours later. Patient taking differently: Inhale 2 puffs into the lungs every 12 (twelve) hours as needed.  01/11/15  Yes Venetia Maxon Rama, MD  calcium citrate-vitamin D (CITRACAL+D) 315-200 MG-UNIT per tablet Take 1 tablet by mouth at bedtime.    Yes Historical Provider, MD  Dapagliflozin Propanediol (FARXIGA) 5 MG TABS Take 5 mg by mouth 2 (two) times daily.   Yes Historical Provider, MD  DULoxetine (CYMBALTA) 20 MG capsule Take 1 capsule (20 mg total) by mouth daily. 01/11/15  Yes Christina P Rama, MD  fentaNYL (DURAGESIC - DOSED MCG/HR) 75 MCG/HR Place 1 patch onto the skin every 3 (three) days. 07/25/15  Yes Historical Provider, MD  glimepiride (AMARYL) 4 MG tablet Take 4 mg by mouth at bedtime. Take before meals   Yes Historical Provider, MD  HYDROmorphone (DILAUDID) 4 MG tablet Take 4 mg by mouth every 8 (  eight) hours as needed for severe pain.   Yes Historical Provider, MD  LORazepam (ATIVAN) 0.5 MG tablet Take 1 table by mouth every 12 hours as needed for anxiety 12/19/14   Yes Nishant Dhungel, MD  metFORMIN (GLUCOPHAGE) 500 MG tablet Take 500 mg by mouth 2 (two) times daily with a meal.   Yes Historical Provider, MD  Multiple Vitamins-Iron (MULTIVITAMINS WITH IRON) TABS tablet Take 1 tablet by mouth daily after supper. 12/19/14  Yes Nishant Dhungel, MD  pantoprazole (PROTONIX) 40 MG tablet Take 1 tablet (40 mg total) by mouth 2 (two) times daily. 12/19/14  Yes Nishant Dhungel, MD  potassium chloride SA (K-DUR,KLOR-CON) 20 MEQ tablet Take 2 tablets (40 mEq total) by mouth daily. 12/19/14  Yes Nishant Dhungel, MD  prochlorperazine (COMPAZINE) 10 MG tablet Take 1 tablet (10 mg total) by mouth every 6 (six) hours as needed for nausea or vomiting. 02/09/15  Yes Earnstine Regal, PA-C  saccharomyces boulardii (FLORASTOR) 250 MG capsule Take 250 mg by mouth 2 (two) times daily.   Yes Historical Provider, MD  temazepam (RESTORIL) 15 MG capsule Take 15 mg by mouth at bedtime as needed for sleep.   Yes Historical Provider, MD  bismuth subsalicylate (PEPTO BISMOL) 262 MG/15ML suspension Place 30 mLs into feeding tube every 4 (four) hours as needed for indigestion. 01/11/15   Venetia Maxon Rama, MD  Na Sulfate-K Sulfate-Mg Sulf SOLN Suprep-Use as directed Patient not taking: Reported on 09/08/2015 08/03/15   [name], MD   No Known Allergies  FAMILY HISTORY:  has no family status information on file.  SOCIAL HISTORY:  reports that he quit smoking about 15 months ago. His smoking use included Cigarettes and Cigars. He has a 100 pack-year smoking history. He quit smokeless tobacco use about 3 years ago. His smokeless tobacco use included Chew. He reports that he does not drink alcohol or use illicit drugs.  REVIEW OF SYSTEMS:   HENT: no HA, nasal congestion, sore throat. Pulm: no cough, + SOB, no wheeze, no CP. Card: no CP no palps. EXT: no pain. No swelling Abd: no pain, NVD, GU: no acute Neuro: no acute   SUBJECTIVE:  C/O nausea and vomiting. Not much relief with zofran. Has  chronic constipation.   VITAL SIGNS: Temp:  [98.1 F (36.7 C)-99.6 F (37.6 C)] 99.6 F (37.6 C) (09/27 0800) Pulse Rate:  [92-102] 96 (09/27 1000) Resp:  [13-23] 23 (09/27 1000) BP: (121-137)/(64-82) 137/71 mmHg (09/27 1000) SpO2:  [93 %-96 %] 95 % (09/27 1000) Weight:  [169 lb 1.5 oz (76.7 kg)-172 lb 9.9 oz (78.3 kg)] 172 lb 9.9 oz (78.3 kg) (09/27 0400) HEMODYNAMICS:   VENTILATOR SETTINGS:   INTAKE / OUTPUT:  Intake/Output Summary (Last 24 hours) at 09/11/15 1044 Last data filed at 09/11/15 1029  Gross per 24 hour  Intake 3040.5 ml  Output   2625 ml  Net  415.5 ml    PHYSICAL EXAMINATION: General:  Awake, alert, not in distress.  Neuro:  Awake, alert no focal def  HEENT:  NCAT, no JVD  Cardiovascular:  Tachy rrr  Lungs:  Crackles both bases. No accessory muscle use  Abdomen:  Soft, not tender. Healed surgical wounds  Musculoskeletal:  Intact  Skin:  No sig edema   LABS:  CBC  Recent Labs Lab 09/09/15 0520 09/10/15 0645 09/11/15 0350  WBC 6.8 4.8 6.9  HGB 10.7* 10.4* 10.7*  HCT 32.2* 31.2* 31.5*  PLT 142* 126* 137*   Coag's  Recent Labs  Lab 09/09/15 0220 09/09/15 0520 09/09/15 0738 09/10/15 1315 09/11/15 0350  APTT 35 118* 77*  --   --   INR 1.28 3.25*  --  1.17 1.26   BMET  Recent Labs Lab 09/08/15 2053 09/09/15 0220 09/11/15 0350  NA 139 141 136  K 3.9 3.7 3.3*  CL 107 109 106  CO2 '24 22 23  '$ BUN '12 10 6  '$ CREATININE 0.97 0.86 0.78  GLUCOSE 195* 181* 92   Electrolytes  Recent Labs Lab 09/08/15 2053 09/09/15 0220 09/11/15 0350  CALCIUM 8.9 8.1* 8.3*  MG  --  1.5*  --   PHOS  --  3.0  --    Sepsis Markers No results for input(s): LATICACIDVEN, PROCALCITON, O2SATVEN in the last 168 hours. ABG No results for input(s): PHART, PCO2ART, PO2ART in the last 168 hours. Liver Enzymes  Recent Labs Lab 09/08/15 2053  AST 19  ALT 13*  ALKPHOS 61  BILITOT 0.4  ALBUMIN 3.9   Cardiac Enzymes  Recent Labs Lab 09/08/15 2053   TROPONINI 0.22*   Glucose  Recent Labs Lab 09/09/15 1952 09/10/15 0747 09/10/15 1223 09/10/15 1553 09/10/15 2137 09/11/15 0749  GLUCAP 158* 138* 103* 108* 98 115*    Imaging Dg Abd Portable 1v  09/10/2015   CLINICAL DATA:  Nausea and vomiting today, constipation since December due to medications, diabetes mellitus, lung cancer, melanoma, COPD, former smoker  EXAM: PORTABLE ABDOMEN - 1 VIEW  COMPARISON:  Portable exam at 1059 hours compared to 02/21/2015  FINDINGS: Scattered stool throughout colon to rectum.  Air-filled nondistended small bowel loops.  No bowel dilatation, bowel wall thickening or free intraperitoneal air.  Chronic opacity RIGHT upper lobe likely related to prior lung cancer in treatment unchanged.  Surgical clips RIGHT upper quadrant likely due to cholecystectomy.  Osseous structures unremarkable.  IMPRESSION: Nonobstructive bowel gas pattern.   Electronically Signed   By: Lavonia Dana M.D.   On: 09/10/2015 11:50     ASSESSMENT / PLAN:  PULMONARY OETT A: Acute hypoxic respiratory failure (initial sats in 80s on room air) Acute and recurrent Pulmonary embolism is setting of known lung cancer: PESI score 4; RV/LV ratio 2 P:  Hemodynamics have improved after TPA. On heparin anticoagulation. Discussed if coumadin would be better for him as it would be easier to reverse in case of bleed. But patient and wife prefers to have an agent which does not need frequent monitoring.  We will convert anticoagulation to Elliquis as it may have a lower incidence of GI bleed.   CARDIOVASCULAR CVL A:  ST Elevated troponin in setting of PE  P:  Echocardiogram shows RV dilation with pressure and volume overload.  RENAL A:  No acute  P:  IV hydration  Avoid hypotension   GASTROINTESTINAL A:  GERD Had h/o extensive abd surgery for bleeding duodenal ulcer, ultimately underwent: oversewing of a posterior bleeding duodenal ulcer, pyloroplasty, cholecystectomy,  repair of right hepatic artery, repair of incidental cholecystoduodenal fistula and placement of a duodenostomy tube and feeding jejunostomy. These have since been removed.  P:  Continue PO diet Cont PPI  Zofran for nausea. Bowel regimen for constipation.  HEMATOLOGIC A:  H/o stage IIIB NSCLCA completed rx 2015; felt not strong enough for surgery therapy  Prior PE Anemia of chronic disease.  P:  Trend cbc See pulm section  Will need life long anticoagulation   INFECTIOUS A: No acute  P:  Trend CBC and fever curve   ENDOCRINE A:  Hyperglycemia  P:  ssi   NEUROLOGIC A:  No acute  P:  RASS goal: 0 Supportive care   FAMILY  - Updates: He does request DNR status but wants treatment - Inter-disciplinary family meet or Palliative Care meeting due by: 9/31  TODAY'S SUMMARY:  Massive PE with hypotension, tachycardia and troponin rise. S/p systemic TPA>> Improved hemodynamics.  Marshell Garfinkel MD [facility] Pager [phone] If no answer or after 3pm call: [phone] 09/09/2015, 9:39 AM

## 2015-09-11 NOTE — Progress Notes (Signed)
[facility] ADULT ICU REPLACEMENT PROTOCOL FOR AM LAB REPLACEMENT ONLY  The patient does apply for the [facility] Adult ICU Electrolyte Replacment Protocol based on the criteria listed below:   1. Is GFR >/= 40 ml/min? Yes.    Patient's GFR today is >60 2. Is urine output >/= 0.5 ml/kg/hr for the last 6 hours? Yes.   Patient's UOP is 1.38 ml/kg/hr 3. Is BUN < 60 mg/dL? Yes.    Patient's BUN today is 6 4. Abnormal electrolyte K 3.3 5. Ordered repletion with: per protocol 6. If a panic level lab has been reported, has the CCM MD in charge been notified? Yes.  .   Physician:  Frederik Schmidt, [place]ary Brim 09/11/2015 5:05 AM

## 2015-09-12 DIAGNOSIS — J9601 Acute respiratory failure with hypoxia: Secondary | ICD-10-CM

## 2015-09-12 DIAGNOSIS — E876 Hypokalemia: Secondary | ICD-10-CM

## 2015-09-12 DIAGNOSIS — T451X5A Adverse effect of antineoplastic and immunosuppressive drugs, initial encounter: Secondary | ICD-10-CM

## 2015-09-12 DIAGNOSIS — D6481 Anemia due to antineoplastic chemotherapy: Secondary | ICD-10-CM

## 2015-09-12 DIAGNOSIS — D696 Thrombocytopenia, unspecified: Secondary | ICD-10-CM

## 2015-09-12 DIAGNOSIS — E08311 Diabetes mellitus due to underlying condition with unspecified diabetic retinopathy with macular edema: Secondary | ICD-10-CM

## 2015-09-12 LAB — BASIC METABOLIC PANEL
ANION GAP: 8 (ref 5–15)
BUN: 6 mg/dL (ref 6–20)
CALCIUM: 8.5 mg/dL — AB (ref 8.9–10.3)
CO2: 26 mmol/L (ref 22–32)
Chloride: 105 mmol/L (ref 101–111)
Creatinine, Ser: 0.73 mg/dL (ref 0.61–1.24)
GLUCOSE: 107 mg/dL — AB (ref 65–99)
POTASSIUM: 3.5 mmol/L (ref 3.5–5.1)
Sodium: 139 mmol/L (ref 135–145)

## 2015-09-12 LAB — CBC
HEMATOCRIT: 31 % — AB (ref 39.0–52.0)
HEMOGLOBIN: 10.2 g/dL — AB (ref 13.0–17.0)
MCH: 28.7 pg (ref 26.0–34.0)
MCHC: 32.9 g/dL (ref 30.0–36.0)
MCV: 87.3 fL (ref 78.0–100.0)
Platelets: 145 10*3/uL — ABNORMAL LOW (ref 150–400)
RBC: 3.55 MIL/uL — AB (ref 4.22–5.81)
RDW: 13.4 % (ref 11.5–15.5)
WBC: 4.5 10*3/uL (ref 4.0–10.5)

## 2015-09-12 LAB — GLUCOSE, CAPILLARY
GLUCOSE-CAPILLARY: 93 mg/dL (ref 65–99)
GLUCOSE-CAPILLARY: 108 mg/dL — AB (ref 65–99)
GLUCOSE-CAPILLARY: 102 mg/dL — AB (ref 65–99)
Glucose-Capillary: 137 mg/dL — ABNORMAL HIGH (ref 65–99)

## 2015-09-12 NOTE — Progress Notes (Signed)
PT Cancellation Note/ Screen  Patient Details Name: Daryl Vega MRN: [id number] DOB: 1946/09/15   Cancelled Treatment:    Reason Eval/Treat Not Completed: PT screened, no needs identified, will sign off  RN in room and states pt has already ambulated x3 today and no PT needs identified.  PT to sign off.   LEMYRE,KATHrine E 09/12/2015, 4:02 PM Carmelia Bake, PT, DPT 09/12/2015 Pager: [phone]

## 2015-09-12 NOTE — Progress Notes (Signed)
Initial Nutrition Assessment  DOCUMENTATION CODES:   Not applicable  INTERVENTION:  - RD will continue to monitor for needs including need for supplement dependent on intake of meals  NUTRITION DIAGNOSIS:   Increased nutrient needs related to catabolic illness, cancer and cancer related treatments as evidenced by estimated needs.  GOAL:   Patient will meet greater than or equal to 90% of their needs  MONITOR:   PO intake, Weight trends, Labs, I & O's  REASON FOR ASSESSMENT:   Consult Assessment of nutrition requirement/status  ASSESSMENT:   69 year old male w/ h/o Non-small cell lung cancer stage IIIB (no treatment since 2015 d/t deconditioning). Admitted on 9/25 w/ Acute submassive PE, ongoing hypotension and troponin rise.  Pt seen for consult. BMI indicates overweight status. Per chart review, pt ate 80% of breakfast this AM. He states that he ordered a hamburger with [place] fries for lunch but has not yet received it.   Noted possible poor dentition/missing teeth but pt denies any chewing or swallowing issues with foods or liquids. He states he has a good appetite currently and feels he did PTA as well. He states that he eats meals and sometimes snacks between meals.  Pt reports UBW ~165 lbs. He states that a year ago he was down to 130 lbs but has been re-gaining weight since that time. No muscle or fat wasting or edema noted during physical assessment.  Will continue to monitor intakes and determine if supplements would be beneficial/warranted. Medications reviewed. Labs reviewed; CBGs: 91-163 mg/dL, Ca: 8.5 mg/dL.   Diet Order:  Diet regular Room service appropriate?: Yes; Fluid consistency:: Thin  Skin:  Reviewed, no issues  Last BM:  9/28  Height:   Ht Readings from Last 1 Encounters:  09/11/15 '5\' 8"'$  (1.727 m)    Weight:   Wt Readings from Last 1 Encounters:  09/12/15 169 lb 12.1 oz (77 kg)    Ideal Body Weight:  70 kg (kg)  BMI:  Body mass index is  25.82 kg/(m^2).  Estimated Nutritional Needs:   Kcal:  1859-0931  Protein:  80-90 grams  Fluid:  2 L/day  EDUCATION NEEDS:   No education needs identified at this time     Jarome Matin, RD, LDN Inpatient Clinical Dietitian Pager # [phone] After hours/weekend pager # [phone]

## 2015-09-12 NOTE — Progress Notes (Addendum)
Patient ID: Daryl Vega, male   DOB: June 15, 1946, 69 y.o.   MRN: [id number]  [facility] PROGRESS NOTE  Daryl Vega XHB:[id number] DOB: 03/25/1946 DOA: 09/08/2015 PCP: [name], MD   Brief narrative:    69 year old male w/ sg h/o NSCLC state IIIB . Completed rx 2015 and not treated for his cancer since d/t deconditioning after extensive abd surgery for bleeding duodenal ulcer, ultimately underwent: oversewing of a posterior bleeding duodenal ulcer, pyloroplasty, cholecystectomy, repair of right hepatic artery, repair of incidental cholecystoduodenal fistula and placement of a duodenostomy tube and feeding jejunostomy. These have since been removed. This was c/b PE after he was discharged from this. Was at home, felt short of breath and fell down. Was able to make it to the restroom. EMS called. SPO2 on arrival 82% on room air. Responded to 6 liters [place]. CT scan showed large PE w/ RV top LV ratio of 2. PCCM asked to eval given on-going hypotension and troponin rise.   SIGNIFICANT EVENTS: 09/23/23 - S/P systemic TPA 9/28 - [facility] assumed care from PCCM   Assessment/Plan:    Acute hypoxic respiratory failure  - in the setting of acute and recurrent pulmonary embolism and known lung cancer: PESI score 4; RV/LV ratio 2 - Hemodynamics have improved after TPA. - was onn heparin anticoagulation initially and after discussion with family, transitioned to Eliquis - tolerating well so far  Elevated troponin in setting of PE and subsequent right heart strain  - Echocardiogram shows RV dilation with pressure and volume overload - no chest pain this AM - OK to d/c telemetry  - no need to cycle CE's at this time   GERD - h/o extensive abd surgery for bleeding duodenal ulcer, ultimately underwent: oversewing of a posterior bleeding duodenal ulcer, pyloroplasty, cholecystectomy, repair of right hepatic artery, repair of incidental cholecystoduodenal fistula and placement of a duodenostomy tube  and feeding jejunostomy - tolerating diet well so far - continue PPI, added Zofran as needed  - keep on bowel regimen for constipation   Anemia of chronic disease of malignancy and chemotherapy  - no signs of bleeding  - CBC in AM  Hypokalemia with Hypomagnesemia  - K supplemented and WNL this AM - check Mg level with next blood work  - BMP in AM  DM type II - continue SSI for now - resume Amaryl upon discharge   Chronic thrombocytopenia, chemotherapy induced  - stable Plt, no signs of bleeding - CBC in AM  Moderate protein calorie malnutrition - nutritionist consulted   DVT prophylaxis - on Eliquis   Code Status: DNR Family Communication:  plan of care discussed with the patient and wife at bedside  Disposition Plan: Home in AM  IV access:  Peripheral IV  Procedures and diagnostic studies:    Ct Head Wo Contrast  09/23/15   No acute intracranial abnormalities.     Ct Angio Chest Pe W/cm 09/08/2015  Positive for bilateral PE's. Elevated RV to LV ratio consistent with right heart strain.  Dg Chest Portable 1 View  09/08/2015 Interval small amount of linear atelectasis or scarring in the left lower lung zone. 2. Stable right lung scarring.   Dg Abd Portable  09/10/2015   Nonobstructive bowel gas pattern.    Dg Hand Complete Left 09/07/2015   No acute osseus abnormality is seen.  Mild degenerative changes.  No marginal erosions.  Dg Hand Complete Right  09/07/2015  No acute bony abnormality.     LE dopplers  9/25 DVT noted in the left femoral and popliteal veins.  Medical Consultants:  None  Other Consultants:  PT Nutritionist   IAnti-Infectives:   None  [name], MD  [place] Pager [phone]  If 7PM-7AM, please contact night-coverage [email or web address] Password [email or web address] 09/12/2015, 6:59 AM   LOS: 3 days   HPI/Subjective: No events overnight.   Objective: Filed Vitals:   09/11/15 1420 09/11/15 1811 09/11/15 2106 09/12/15 0534  BP: 140/80 122/54 119/70  112/72  Pulse: 96 106 99 97  Temp: 98.7 F (37.1 C) 98.6 F (37 C) 99.5 F (37.5 C) 98.4 F (36.9 C)  TempSrc:  Oral Oral Oral  Resp:   20 20  Height:      Weight:    77 kg (169 lb 12.1 oz)  SpO2: 96% 93% 95% 97%    Intake/Output Summary (Last 24 hours) at 09/12/15 0659 Last data filed at 09/12/15 0655  Gross per 24 hour  Intake 2156.16 ml  Output    725 ml  Net 1431.16 ml    Exam:   General:  Pt is alert, follows commands appropriately, not in acute distress  Cardiovascular: Regular rate and rhythm, no rubs, no gallops  Respiratory: Clear to auscultation bilaterally, no wheezing, diminished breath sounds at bases   Abdomen: Soft, non tender, non distended, bowel sounds present, no guarding  Extremities: No edema, pulses DP and PT palpable bilaterally  Neuro: Grossly nonfocal  Data Reviewed: Basic Metabolic Panel:  Recent Labs Lab 09/08/15 2053 09/09/15 0220 09/11/15 0350 09/12/15 0548  NA 139 141 136 139  K 3.9 3.7 3.3* 3.5  CL 107 109 106 105  CO2 '24 22 23 26  '$ GLUCOSE 195* 181* 92 107*  BUN '12 10 6 6  '$ CREATININE 0.97 0.86 0.78 0.73  CALCIUM 8.9 8.1* 8.3* 8.5*  MG  --  1.5*  --   --   PHOS  --  3.0  --   --    Liver Function Tests:  Recent Labs Lab 09/08/15 2053  AST 19  ALT 13*  ALKPHOS 61  BILITOT 0.4  PROT 7.0  ALBUMIN 3.9   CBC:  Recent Labs Lab 09/08/15 2053 09/09/15 0220 09/09/15 0520 09/10/15 0645 09/11/15 0350 09/12/15 0548  WBC 7.8 5.1 6.8 4.8 6.9 4.5  NEUTROABS 5.8  --   --   --   --   --   HGB 12.5* 10.6* 10.7* 10.4* 10.7* 10.2*  HCT 38.1* 32.0* 32.2* 31.2* 31.5* 31.0*  MCV 90.3 90.7 91.5 90.2 88.7 87.3  PLT 148* 125* 142* 126* 137* 145*   Cardiac Enzymes:  Recent Labs Lab 09/08/15 2053  TROPONINI 0.22*   CBG:  Recent Labs Lab 09/10/15 2137 09/11/15 0749 09/11/15 1200 09/11/15 1739 09/11/15 2105  GLUCAP 98 115* 91 163* 114*    Recent Results (from the past 240 hour(s))  MRSA PCR Screening     Status:  None   Collection Time: 09/09/15  1:55 AM  Result Value Ref Range Status   MRSA by PCR NEGATIVE NEGATIVE Final    Comment:        The GeneXpert MRSA Assay (FDA approved for NASAL specimens only), is one component of a comprehensive MRSA colonization surveillance program. It is not intended to diagnose MRSA infection nor to guide or monitor treatment for MRSA infections.      Scheduled Meds: . apixaban  10 mg Oral BID   Followed by  . [START ON 09/18/2015] apixaban  5 mg Oral BID  . atorvastatin  80 mg Oral QHS  . budesonide-formoterol  2 puff Inhalation BID  . DULoxetine  20 mg Oral Daily  . fentaNYL  75 mcg Transdermal Q3 days  . insulin aspart  0-5 Units Subcutaneous QHS  . insulin aspart  0-9 Units Subcutaneous TID WC  . pantoprazole  40 mg Oral BID AC  . saccharomyces boulardii  250 mg Oral BID  . senna-docusate  1 tablet Oral Daily   Continuous Infusions: . lactated ringers 50 mL/hr at 09/12/15 [phone]

## 2015-09-12 NOTE — Progress Notes (Addendum)
SATURATION QUALIFICATIONS: (This note is used to comply with regulatory documentation for home oxygen)  Patient Saturations on Room Air at Rest =93%  Patient Saturations on Room Air while Ambulating = 87%  Patient Saturations on 1 Liters of oxygen while Ambulating = 92%  Please briefly explain why patient needs home oxygen: Oxygen saturations decrease when ambulating.

## 2015-09-12 NOTE — Care Management Note (Signed)
Case Management Note  Patient Details  Name: Daryl Vega MRN: [id number] Date of Birth: 04-05-1946  Subjective/Objective:   Transitioned to eliquis.                 Action/Plan:d/c home.   Expected Discharge Date:                  Expected Discharge Plan:  Home/Self Care  In-House Referral:     Discharge planning Services  CM Consult  Post Acute Care Choice:    Choice offered to:     DME Arranged:    DME Agency:     HH Arranged:    HH Agency:     Status of Service:  In process, will continue to follow  Medicare Important Message Given:    Date Medicare IM Given:    Medicare IM give by:    Date Additional Medicare IM Given:    Additional Medicare Important Message give by:     If discussed at [place] of Stay Meetings, dates discussed:    Additional Comments:  Dessa Phi, RN 09/12/2015, 1:14 PM

## 2015-09-12 NOTE — Discharge Instructions (Signed)
Information on my medicine - ELIQUIS (apixaban)  This medication education was reviewed with me or my healthcare representative as part of my discharge preparation.  The pharmacist that spoke with me during my hospital stay was:  [name], [facility]  Why was Eliquis prescribed for you? Eliquis was prescribed to treat blood clots that may have been found in the veins of your legs (deep vein thrombosis) or in your lungs (pulmonary embolism) and to reduce the risk of them occurring again.  What do You need to know about Eliquis ? The starting dose is 10 mg (two 5 mg tablets) taken TWICE daily for the FIRST SEVEN (7) DAYS, then on (enter date)  September 18, 2015  the dose is reduced to ONE 5 mg tablet taken TWICE daily.  Eliquis may be taken with or without food.   Try to take the dose about the same time in the morning and in the evening. If you have difficulty swallowing the tablet whole please discuss with your pharmacist how to take the medication safely.  Take Eliquis exactly as prescribed and DO NOT stop taking Eliquis without talking to the doctor who prescribed the medication.  Stopping may increase your risk of developing a new blood clot.  Refill your prescription before you run out.  After discharge, you should have regular check-up appointments with your healthcare provider that is prescribing your Eliquis.    What do you do if you miss a dose? If a dose of ELIQUIS is not taken at the scheduled time, take it as soon as possible on the same day and twice-daily administration should be resumed. The dose should not be doubled to make up for a missed dose.  Important Safety Information A possible side effect of Eliquis is bleeding. You should call your healthcare provider right away if you experience any of the following: ? Bleeding from an injury or your nose that does not stop. ? Unusual colored urine (red or dark brown) or unusual colored stools (red or black). ? Unusual  bruising for unknown reasons. ? A serious fall or if you hit your head (even if there is no bleeding).  Some medicines may interact with Eliquis and might increase your risk of bleeding or clotting while on Eliquis. To help avoid this, consult your healthcare provider or pharmacist prior to using any new prescription or non-prescription medications, including herbals, vitamins, non-steroidal anti-inflammatory drugs (NSAIDs) and supplements.  This website has more information on Eliquis (apixaban): http://www.eliquis.com/eliquis/home

## 2015-09-13 DIAGNOSIS — R112 Nausea with vomiting, unspecified: Secondary | ICD-10-CM | POA: Insufficient documentation

## 2015-09-13 DIAGNOSIS — G43A0 Cyclical vomiting, not intractable: Secondary | ICD-10-CM

## 2015-09-13 LAB — BASIC METABOLIC PANEL
ANION GAP: 8 (ref 5–15)
BUN: 8 mg/dL (ref 6–20)
CO2: 27 mmol/L (ref 22–32)
Calcium: 8.2 mg/dL — ABNORMAL LOW (ref 8.9–10.3)
Chloride: 103 mmol/L (ref 101–111)
Creatinine, Ser: 0.81 mg/dL (ref 0.61–1.24)
GLUCOSE: 143 mg/dL — AB (ref 65–99)
POTASSIUM: 3.4 mmol/L — AB (ref 3.5–5.1)
Sodium: 138 mmol/L (ref 135–145)

## 2015-09-13 LAB — CBC
HCT: 31.9 % — ABNORMAL LOW (ref 39.0–52.0)
Hemoglobin: 10.5 g/dL — ABNORMAL LOW (ref 13.0–17.0)
MCH: 28.8 pg (ref 26.0–34.0)
MCHC: 32.9 g/dL (ref 30.0–36.0)
MCV: 87.4 fL (ref 78.0–100.0)
Platelets: 156 10*3/uL (ref 150–400)
RBC: 3.65 MIL/uL — ABNORMAL LOW (ref 4.22–5.81)
RDW: 13.4 % (ref 11.5–15.5)
WBC: 4.8 10*3/uL (ref 4.0–10.5)

## 2015-09-13 LAB — GLUCOSE, CAPILLARY: Glucose-Capillary: 134 mg/dL — ABNORMAL HIGH (ref 65–99)

## 2015-09-13 MED ORDER — BISACODYL 10 MG RE SUPP: 10.0000 mg | Freq: Every day | RECTAL | Status: DC | PRN

## 2015-09-13 MED ORDER — LORAZEPAM 0.5 MG PO TABS: ORAL_TABLET | ORAL | Status: DC

## 2015-09-13 MED ORDER — HYDROMORPHONE HCL 4 MG PO TABS: 4.0000 mg | ORAL_TABLET | Freq: Three times a day (TID) | ORAL | Status: DC | PRN

## 2015-09-13 MED ORDER — POTASSIUM CHLORIDE CRYS ER 20 MEQ PO TBCR
40.0000 meq | EXTENDED_RELEASE_TABLET | Freq: Once | ORAL | Status: AC
  Administered 2015-09-13: 40 meq via ORAL
  Filled 2015-09-13: qty 2

## 2015-09-13 MED ORDER — APIXABAN 5 MG PO TABS
10.0000 mg | ORAL_TABLET | Freq: Two times a day (BID) | ORAL | Status: DC
Start: 2015-09-13 — End: 2015-10-01

## 2015-09-13 MED ORDER — FENTANYL 75 MCG/HR TD PT72: 75.0000 ug | MEDICATED_PATCH | TRANSDERMAL | Status: DC

## 2015-09-13 MED ORDER — APIXABAN 5 MG PO TABS: 5.0000 mg | ORAL_TABLET | Freq: Two times a day (BID) | ORAL | Status: DC

## 2015-09-13 NOTE — Care Management Note (Signed)
Case Management Note  Patient Details  Name: Daryl Vega MRN: [id number] Date of Birth: [birth year]  Subjective/Objective: Qualifies for home 02. [facility] [employer] rep [place] aware of order, & d/c.  Nurse aware of home 02 to be brought to rm prior d/c.                 Action/Plan:d/c home w/home 02.   Expected Discharge Date:                  Expected Discharge Plan:  Home/Self Care  In-House Referral:     Discharge planning Services  CM Consult  Post Acute Care Choice:    Choice offered to:  Patient  DME Arranged:  [facility] D[facility] Agency:  [place]:    [place] Agency:     Status of Service:  In process, will continue to follow  Medicare Important Message Given:  Yes-second notification given Date Medicare IM Given:    Medicare IM give by:    Date Additional Medicare IM Given:    Additional Medicare Important Message give by:     If discussed at [place] of Stay Meetings, dates discussed:    Additional Comments:  Dessa Phi, RN 09/13/2015, 10:41 AM

## 2015-09-13 NOTE — Care Management Important Message (Signed)
Important Message  Patient Details  Name: Daryl Vega MRN: [id number] Date of Birth: 1946/03/21   Medicare Important Message Given:  Yes-second notification given    Dessa Phi, RN 09/13/2015, 10:42 AM

## 2015-09-13 NOTE — Discharge Summary (Signed)
Physician Discharge Summary  Daryl Vega STM:[id number] DOB: 23-Feb-1946 DOA: 09/08/2015  PCP: [name], MD  Admit date: 09/08/2015 Discharge date: 09/13/2015  Recommendations for Outpatient Follow-up:  1. Pt will need to follow up with PCP in 2-3 weeks post discharge 2. Please obtain BMP to evaluate electrolytes and kidney function, Mg and K level 3. Please note both electrolytes supplemented prior to discharge and pt made aware these need to be rechecked  4. Please also check CBC to evaluate Hg and Hct levels  Discharge Diagnoses:  Principal Problem:   Acute respiratory failure with hypoxia Active Problems:   Acute massive pulmonary embolism   Lung cancer   Diabetes mellitus   Anemia associated with chemotherapy   Thrombocytopenia, chemotherapy induced    Hypokalemia   Pulmonary emboli   Discharge Condition: Stable  Diet recommendation: Heart healthy diet discussed in details   Brief narrative:    69 year old male w/ sg h/o NSCLC state IIIB . Completed rx 2015 and not treated for his cancer since d/t deconditioning after extensive abd surgery for bleeding duodenal ulcer, ultimately underwent: oversewing of a posterior bleeding duodenal ulcer, pyloroplasty, cholecystectomy, repair of right hepatic artery, repair of incidental cholecystoduodenal fistula and placement of a duodenostomy tube and feeding jejunostomy. These have since been removed. This was c/b PE after he was discharged from this. Was at home, felt short of breath and fell down. Was able to make it to the restroom. EMS called. SPO2 on arrival 82% on room air. Responded to 6 liters [place]. CT scan showed large PE w/ RV top LV ratio of 2. PCCM asked to eval given on-going hypotension and troponin rise.   SIGNIFICANT EVENTS: 9/25 - S/P systemic TPA 9/28 - [facility] assumed care from PCCM   Assessment/Plan:    Acute hypoxic respiratory failure  - in the setting of acute and recurrent pulmonary embolism and known  lung cancer: PESI score 4; RV/LV ratio 2 - Hemodynamics have improved after TPA. - was on heparin anticoagulation initially and after discussion with family, transitioned to Eliquis - tolerating well so far and wants to go home   Elevated troponin in setting of PE and subsequent right heart strain  - Echocardiogram shows RV dilation with pressure and volume overload - no chest pain this AM - no need to cycle CE's at this time   GERD - h/o extensive abd surgery for bleeding duodenal ulcer, ultimately underwent: oversewing of a posterior bleeding duodenal ulcer, pyloroplasty, cholecystectomy, repair of right hepatic artery, repair of incidental cholecystoduodenal fistula and placement of a duodenostomy tube and feeding jejunostomy - tolerating diet well so far - continue PPI upon discharge   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   Ct Angio Chest Pe W/cm 09/08/2015 Positive for bilateral PE's. Elevated RV to LV ratio consistent with right heart strain.  Dg Chest Portable 1 View 09/08/2015 Interval small  amount of linear atelectasis or scarring in the left lower lung zone. 2. Stable right lung scarring.   Dg Abd Portable 09/10/2015 Nonobstructive bowel gas pattern.   Dg Hand Complete Left 09/07/2015 No acute osseus abnormality is seen. Mild degenerative changes.  No marginal erosions.  Dg Hand Complete Right 09/07/2015 No acute bony abnormality.   LE dopplers 9/25 DVT noted in the left femoral and popliteal veins.  Medical Consultants:  None  Other Consultants:  PT Nutritionist   IAnti-Infectives:   None      Discharge Exam: Filed Vitals:   09/13/15 0441  BP: 119/59  Pulse: 83  Temp: 98.3 F (36.8 C)  Resp: 18   Filed Vitals:   09/12/15 0534 09/12/15 1350 09/12/15 2109 09/13/15 0441  BP: 112/72 108/70 119/69 119/59  Pulse: 97 94 89 83  Temp: 98.4 F (36.9 C) 98 F (36.7 C) 98.1 F (36.7 C) 98.3 F (36.8 C)  TempSrc: Oral Oral Oral Oral  Resp: '20 18 18 18  '$ Height:      Weight: 77 kg (169 lb 12.1 oz)   78.2 kg (172 lb 6.4 oz)  SpO2: 97% 98% 95% 94%    General: Pt is alert, follows commands appropriately, not in acute distress Cardiovascular: Regular rate and rhythm, no rubs, no gallops Respiratory: Clear to auscultation bilaterally, no wheezing, no crackles, no rhonchi Abdominal: Soft, non tender, non distended, bowel sounds +, no guarding Extremities:  no cyanosis, pulses palpable bilaterally DP and PT Neuro: Grossly nonfocal  Discharge Instructions  Discharge Instructions    Diet - low sodium heart healthy    Complete by:  As directed      Increase activity slowly    Complete by:  As directed             Medication List    STOP taking these medications        FARXIGA 5 MG Tabs tablet  Generic drug:  dapagliflozin propanediol     Na Sulfate-K Sulfate-Mg Sulf Soln      TAKE these medications        apixaban 5 MG Tabs tablet  Commonly known as:  ELIQUIS  Take 2 tablets (10 mg total) by mouth 2 (two) times daily. Please note to take 10 mg tablet twice per day until October 3rd, 2016. Starting October 4th, 2016 you will need to take Eliquis 5 mg twice daily     apixaban 5 MG Tabs tablet  Commonly known as:  ELIQUIS  Take 1 tablet (5 mg total) by mouth 2 (two) times daily.  Start taking  on:  09/18/2015     atorvastatin 80 MG tablet  Commonly known as:  LIPITOR  Take 80 mg by mouth at bedtime.     bisacodyl 10 MG suppository  Commonly known as:  DULCOLAX  Place 1 suppository (10 mg total) rectally daily as needed for moderate constipation.     bismuth subsalicylate 706 CB/76EG suspension  Commonly known as:  PEPTO BISMOL  Place 30 mLs into feeding tube every 4 (four) hours as needed for indigestion.     budesonide-formoterol 160-4.5 MCG/ACT inhaler  Commonly known as:  SYMBICORT  Take 2 puffs first thing in am and then another 2 puffs about 12 hours later.     calcium citrate-vitamin D 315-200 MG-UNIT tablet  Commonly known as:  CITRACAL+D  Take 1 tablet by mouth at bedtime.     DULoxetine 20 MG capsule  Commonly known as:  CYMBALTA  Take 1  capsule (20 mg total) by mouth daily.     fentaNYL 75 MCG/HR  Commonly known as:  DURAGESIC - dosed mcg/hr  Place 1 patch (75 mcg total) onto the skin every 3 (three) days.     FLORASTOR 250 MG capsule  Generic drug:  saccharomyces boulardii  Take 250 mg by mouth 2 (two) times daily.     glimepiride 4 MG tablet  Commonly known as:  AMARYL  Take 4 mg by mouth at bedtime. Take before meals     HYDROmorphone 4 MG tablet  Commonly known as:  DILAUDID  Take 1 tablet (4 mg total) by mouth every 8 (eight) hours as needed for severe pain.     LORazepam 0.5 MG tablet  Commonly known as:  ATIVAN  Take 1 table by mouth every 12 hours as needed for anxiety     metFORMIN 500 MG tablet  Commonly known as:  GLUCOPHAGE  Take 500 mg by mouth 2 (two) times daily with a meal.     multivitamins with iron Tabs tablet  Take 1 tablet by mouth daily after supper.     pantoprazole 40 MG tablet  Commonly known as:  PROTONIX  Take 1 tablet (40 mg total) by mouth 2 (two) times daily.     potassium chloride SA 20 MEQ tablet  Commonly known as:  K-DUR,KLOR-CON  Take 2 tablets (40 mEq total) by mouth daily.     prochlorperazine 10 MG  tablet  Commonly known as:  COMPAZINE  Take 1 tablet (10 mg total) by mouth every 6 (six) hours as needed for nausea or vomiting.     temazepam 15 MG capsule  Commonly known as:  RESTORIL  Take 15 mg by mouth at bedtime as needed for sleep.           Follow-up Information    Follow up with [name], MD.   Specialty:  Family Medicine   Contact information:   [address] [place], [place] [address] [phone]        The results of significant diagnostics from this hospitalization (including imaging, microbiology, ancillary and laboratory) are listed below for reference.     Microbiology: Recent Results (from the past 240 hour(s))  MRSA PCR Screening     Status: None   Collection Time: 09/09/15  1:55 AM  Result Value Ref Range Status   MRSA by PCR NEGATIVE NEGATIVE Final    Comment:        The GeneXpert MRSA Assay (FDA approved for NASAL specimens only), is one component of a comprehensive MRSA colonization surveillance program. It is not intended to diagnose MRSA infection nor to guide or monitor treatment for MRSA infections.      Labs: Basic Metabolic Panel:  Recent Labs Lab 09/08/15 2053 09/09/15 0220 09/11/15 0350 09/12/15 0548 09/13/15 0558  NA 139 141 136 139 138  K 3.9 3.7 3.3* 3.5 3.4*  CL 107 109 106 105 103  CO2 '24 22 23 26 27  '$ GLUCOSE 195* 181* 92 107* 143*  BUN '12 10 6 6 8  '$ CREATININE 0.97 0.86 0.78 0.73 0.81  CALCIUM 8.9 8.1* 8.3* 8.5* 8.2*  MG  --  1.5*  --   --   --   PHOS  --  3.0  --   --   --    Liver Function Tests:  Recent Labs Lab 09/08/15 2053  AST 19  ALT 13*  ALKPHOS 61  BILITOT 0.4  PROT  7.0  ALBUMIN 3.9   No results for input(s): LIPASE, AMYLASE in the last 168 hours. No results for input(s): AMMONIA in the last 168 hours. CBC:  Recent Labs Lab 09/08/15 2053  09/09/15 0520 09/10/15 0645 09/11/15 0350 09/12/15 0548 09/13/15 0558  WBC 7.8  < > 6.8 4.8 6.9 4.5 4.8   NEUTROABS 5.8  --   --   --   --   --   --   HGB 12.5*  < > 10.7* 10.4* 10.7* 10.2* 10.5*  HCT 38.1*  < > 32.2* 31.2* 31.5* 31.0* 31.9*  MCV 90.3  < > 91.5 90.2 88.7 87.3 87.4  PLT 148*  < > 142* 126* 137* 145* 156  < > = values in this interval not displayed. Cardiac Enzymes:  Recent Labs Lab 09/08/15 2053  TROPONINI 0.22*   BNP: BNP (last 3 results)  Recent Labs  09/08/15 2053  BNP 42.1   CBG:  Recent Labs Lab 09/12/15 0741 09/12/15 1152 09/12/15 1727 09/12/15 2112 09/13/15 0733  GLUCAP 93 108* 102* 137* 134*   SIGNED: Time coordinating discharge: 30 minutes  MAGICK-MYERS, ISKRA, MD  [facility] 09/13/2015, 10:12 AM Pager [phone]  If 7PM-7AM, please contact night-coverage [email or web address] Password [email or web address]

## 2015-09-24 ENCOUNTER — Emergency Department (HOSPITAL_COMMUNITY): Payer: Medicare Other

## 2015-09-24 ENCOUNTER — Encounter (HOSPITAL_COMMUNITY): Payer: Self-pay | Admitting: Emergency Medicine

## 2015-09-24 ENCOUNTER — Inpatient Hospital Stay (HOSPITAL_COMMUNITY)
Admission: EM | Admit: 2015-09-24 | Discharge: 2015-10-01 | DRG: 070 | Disposition: A | Discharge status: Home Health | Payer: Medicare Other | Attending: Internal Medicine | Admitting: Internal Medicine

## 2015-09-24 DIAGNOSIS — E1169 Type 2 diabetes mellitus with other specified complication: Secondary | ICD-10-CM | POA: Diagnosis not present

## 2015-09-24 DIAGNOSIS — E785 Hyperlipidemia, unspecified: Secondary | ICD-10-CM | POA: Diagnosis present

## 2015-09-24 DIAGNOSIS — R296 Repeated falls: Secondary | ICD-10-CM | POA: Diagnosis present

## 2015-09-24 DIAGNOSIS — R918 Other nonspecific abnormal finding of lung field: Secondary | ICD-10-CM | POA: Diagnosis present

## 2015-09-24 DIAGNOSIS — J449 Chronic obstructive pulmonary disease, unspecified: Secondary | ICD-10-CM | POA: Diagnosis present

## 2015-09-24 DIAGNOSIS — Z6823 Body mass index (BMI) 23.0-23.9, adult: Secondary | ICD-10-CM

## 2015-09-24 DIAGNOSIS — N390 Urinary tract infection, site not specified: Secondary | ICD-10-CM | POA: Diagnosis present

## 2015-09-24 DIAGNOSIS — E1142 Type 2 diabetes mellitus with diabetic polyneuropathy: Secondary | ICD-10-CM | POA: Diagnosis present

## 2015-09-24 DIAGNOSIS — D638 Anemia in other chronic diseases classified elsewhere: Secondary | ICD-10-CM | POA: Diagnosis present

## 2015-09-24 DIAGNOSIS — Z87891 Personal history of nicotine dependence: Secondary | ICD-10-CM

## 2015-09-24 DIAGNOSIS — E119 Type 2 diabetes mellitus without complications: Secondary | ICD-10-CM

## 2015-09-24 DIAGNOSIS — I2699 Other pulmonary embolism without acute cor pulmonale: Secondary | ICD-10-CM | POA: Diagnosis present

## 2015-09-24 DIAGNOSIS — R2681 Unsteadiness on feet: Secondary | ICD-10-CM

## 2015-09-24 DIAGNOSIS — E43 Unspecified severe protein-calorie malnutrition: Secondary | ICD-10-CM | POA: Diagnosis present

## 2015-09-24 DIAGNOSIS — Z809 Family history of malignant neoplasm, unspecified: Secondary | ICD-10-CM

## 2015-09-24 DIAGNOSIS — G9340 Encephalopathy, unspecified: Secondary | ICD-10-CM | POA: Diagnosis present

## 2015-09-24 DIAGNOSIS — I951 Orthostatic hypotension: Secondary | ICD-10-CM | POA: Diagnosis present

## 2015-09-24 DIAGNOSIS — I2782 Chronic pulmonary embolism: Secondary | ICD-10-CM | POA: Diagnosis not present

## 2015-09-24 DIAGNOSIS — E876 Hypokalemia: Secondary | ICD-10-CM | POA: Diagnosis present

## 2015-09-24 DIAGNOSIS — Z85820 Personal history of malignant melanoma of skin: Secondary | ICD-10-CM | POA: Diagnosis not present

## 2015-09-24 DIAGNOSIS — Z66 Do not resuscitate: Secondary | ICD-10-CM | POA: Diagnosis present

## 2015-09-24 DIAGNOSIS — Z7901 Long term (current) use of anticoagulants: Secondary | ICD-10-CM

## 2015-09-24 DIAGNOSIS — R410 Disorientation, unspecified: Secondary | ICD-10-CM

## 2015-09-24 DIAGNOSIS — E1140 Type 2 diabetes mellitus with diabetic neuropathy, unspecified: Secondary | ICD-10-CM | POA: Diagnosis present

## 2015-09-24 DIAGNOSIS — G8929 Other chronic pain: Secondary | ICD-10-CM | POA: Diagnosis present

## 2015-09-24 DIAGNOSIS — Z79891 Long term (current) use of opiate analgesic: Secondary | ICD-10-CM

## 2015-09-24 DIAGNOSIS — C3411 Malignant neoplasm of upper lobe, right bronchus or lung: Secondary | ICD-10-CM | POA: Diagnosis present

## 2015-09-24 DIAGNOSIS — R69 Illness, unspecified: Secondary | ICD-10-CM

## 2015-09-24 DIAGNOSIS — E7800 Pure hypercholesterolemia, unspecified: Secondary | ICD-10-CM | POA: Diagnosis present

## 2015-09-24 DIAGNOSIS — D630 Anemia in neoplastic disease: Secondary | ICD-10-CM | POA: Diagnosis present

## 2015-09-24 DIAGNOSIS — K219 Gastro-esophageal reflux disease without esophagitis: Secondary | ICD-10-CM | POA: Diagnosis present

## 2015-09-24 DIAGNOSIS — Z79899 Other long term (current) drug therapy: Secondary | ICD-10-CM

## 2015-09-24 DIAGNOSIS — R443 Hallucinations, unspecified: Secondary | ICD-10-CM | POA: Diagnosis present

## 2015-09-24 DIAGNOSIS — T451X5A Adverse effect of antineoplastic and immunosuppressive drugs, initial encounter: Secondary | ICD-10-CM | POA: Diagnosis present

## 2015-09-24 DIAGNOSIS — E08319 Diabetes mellitus due to underlying condition with unspecified diabetic retinopathy without macular edema: Secondary | ICD-10-CM

## 2015-09-24 DIAGNOSIS — W19XXXA Unspecified fall, initial encounter: Secondary | ICD-10-CM | POA: Diagnosis present

## 2015-09-24 DIAGNOSIS — Z8249 Family history of ischemic heart disease and other diseases of the circulatory system: Secondary | ICD-10-CM

## 2015-09-24 DIAGNOSIS — C3490 Malignant neoplasm of unspecified part of unspecified bronchus or lung: Secondary | ICD-10-CM | POA: Diagnosis present

## 2015-09-24 DIAGNOSIS — D6481 Anemia due to antineoplastic chemotherapy: Secondary | ICD-10-CM | POA: Diagnosis present

## 2015-09-24 LAB — BASIC METABOLIC PANEL
Anion gap: 14 (ref 5–15)
BUN: 18 mg/dL (ref 6–20)
CHLORIDE: 98 mmol/L — AB (ref 101–111)
CO2: 23 mmol/L (ref 22–32)
CREATININE: 1.05 mg/dL (ref 0.61–1.24)
Calcium: 9.6 mg/dL (ref 8.9–10.3)
GFR calc non Af Amer: >60 mL/min (ref >60)
Glucose, Bld: 218 mg/dL — ABNORMAL HIGH (ref 65–99)
POTASSIUM: 3.6 mmol/L (ref 3.5–5.1)
SODIUM: 135 mmol/L (ref 135–145)

## 2015-09-24 LAB — CBC
HEMATOCRIT: 38.1 % — AB (ref 39.0–52.0)
HEMOGLOBIN: 12.8 g/dL — AB (ref 13.0–17.0)
MCH: 29 pg (ref 26.0–34.0)
MCHC: 33.6 g/dL (ref 30.0–36.0)
MCV: 86.4 fL (ref 78.0–100.0)
PLATELETS: 227 10*3/uL (ref 150–400)
RBC: 4.41 MIL/uL (ref 4.22–5.81)
RDW: 13.2 % (ref 11.5–15.5)
WBC: 8.2 10*3/uL (ref 4.0–10.5)

## 2015-09-24 LAB — URINALYSIS, ROUTINE W REFLEX MICROSCOPIC
GLUCOSE, UA: NEGATIVE mg/dL
Hgb urine dipstick: NEGATIVE
KETONES UR: NEGATIVE mg/dL
NITRITE: NEGATIVE
PH: 6 (ref 5.0–8.0)
Protein, ur: NEGATIVE mg/dL
SPECIFIC GRAVITY, URINE: 1.025 (ref 1.005–1.030)
Urobilinogen, UA: 1 mg/dL (ref 0.0–1.0)

## 2015-09-24 LAB — HEPATIC FUNCTION PANEL
ALBUMIN: 3.6 g/dL (ref 3.5–5.0)
ALK PHOS: 93 U/L (ref 38–126)
ALT: 10 U/L — AB (ref 17–63)
AST: 18 U/L (ref 15–41)
BILIRUBIN DIRECT: 0.3 mg/dL (ref 0.1–0.5)
BILIRUBIN TOTAL: 1.1 mg/dL (ref 0.3–1.2)
Indirect Bilirubin: 0.8 mg/dL (ref 0.3–0.9)
Total Protein: 7 g/dL (ref 6.5–8.1)

## 2015-09-24 LAB — URINE MICROSCOPIC-ADD ON

## 2015-09-24 LAB — AMMONIA: Ammonia: 27 umol/L (ref 9–35)

## 2015-09-24 LAB — TROPONIN I

## 2015-09-24 LAB — LIPASE, BLOOD: Lipase: 25 U/L (ref 22–51)

## 2015-09-24 NOTE — ED Notes (Signed)
Per wife-has been seeing blood in his urine since Saturday-on blood thinners for previous blood clot

## 2015-09-24 NOTE — ED Notes (Signed)
Will give report on the floor.

## 2015-09-24 NOTE — H&P (Signed)
[facility] History and Physical  Patient: Daryl Vega  MRN: [id number]  DOB: 1946-12-15  DOS: the patient was seen and examined on 09/24/2015 PCP: [name], MD  Referring physician: Dr. Vallery Ridge Chief Complaint: Confusion and blood in the urine  HPI: Daryl Vega is a 69 y.o. male with Past medical history of lung cancer currently on observation, diabetes mellitus, dyslipidemia, recent pulmonary embolism on anticoagulation, COPD, anemia, protein malnutrition. Patient presents with complaints of confusion and recurrent fall. The wife was also noted occasional pink tinged urine. Patient was recently hospitalized for acute massive pulmonary embolism has undergone TPA treatment and was discharged on Apixaban. Patient was on fentanyl patch 75 g which was reduced by patient's pain management clinic to 50 g starting tomorrow. Patient was also recently discharged on lorazepam and he is on temazepam every night as needed. As per my discussion with wife patient is on Lyrica 100 mg twice a day, which was not given during his recent hospitalization and he was given Cymbalta and discharged on Cymbalta. After going home the patient started having recurrent fall which were not described as a mechanical fall. 2 of the recent fall we'll described in the middle of the night when patient was coming out of the bed. Most of the falls were described as white patient walking would suddenly lose balance and would fall down. Denies having any passing out episodes. Other than above mentioned medication changes no changes in the medication. No fever no chills no nausea no vomiting no diarrhea no constipation. Patient's wife has noted pink tinged urine for last 2 days and was concerned about hematuria. Patient himself denies having any burning urination and does not have any blood or bleeding anywhere else. Patient has been talking "out of his head" as per his wife and his baseline is fairly  alert.  The patient is coming from home.  At his baseline ambulates with walker And is independent for most of his ADL; manages his medication on his own.  Review of Systems: as mentioned in the history of present illness.  A comprehensive review of the other systems is negative.  Past Medical History  Diagnosis Date  . DM (diabetes mellitus) ([place])   . Pneumonia   . Hypercholesteremia   . Melanoma ([place])   . Radiation 08/03/14-08/23/14    35 gray to right chest  . Lung cancer ([place])   . FH: chemotherapy   . Gastroparesis 02/09/2015  . Cholecystoduodenal fistula   . Pulmonary embolus ([place])   . Duodenal ulcer   . COPD (chronic obstructive pulmonary disease) ([place])   . Protein calorie malnutrition ([place])   . Elevated LFTs   . C. difficile enteritis   . Anemia    Past Surgical History  Procedure Laterality Date  . Video bronchoscopy Bilateral 07/20/2014    Procedure: VIDEO BRONCHOSCOPY WITHOUT FLUORO;  Surgeon: Tanda Rockers, MD;  Location: [facility];  Service: Cardiopulmonary;  Laterality: Bilateral;  . Esophagogastroduodenoscopy N/A 11/30/2014    Procedure: ESOPHAGOGASTRODUODENOSCOPY (EGD);  Surgeon: [name], MD;  Location: [facility];  Service: Endoscopy;  Laterality: N/A;  . Laparotomy N/A 11/30/2014    Procedure: EXPLORATORY LAPAROTOMY, PYLOROPLASTY, OVERSEWING OF POSTERIOR DUODENUM, DUODENOSTOMY, CHOLECYSTECTOMY, JEJEUNOSTOMY;  Surgeon: Jackolyn Confer, MD;  Location: [facility];  Service: General;  Laterality: N/A;  . Pyloroplasty    . Cholecystectomy    . Duodenostomy tube    . Jejunostomy feeding tube     Social History:  reports that he quit smoking about 15  months ago. His smoking use included Cigarettes and Cigars. He has a 100 pack-year smoking history. He quit smokeless tobacco use about 3 years ago. His smokeless tobacco use included Chew. He reports that he does not drink alcohol or use illicit drugs.  No Known Allergies  Family History  Problem Relation Age  of Onset  . Heart disease Mother   . Cancer Mother     ? type    Prior to Admission medications   Medication Sig Start Date End Date Taking? Authorizing Provider  apixaban (ELIQUIS) 5 MG TABS tablet Take 1 tablet (5 mg total) by mouth 2 (two) times daily. 09/18/15  Yes Theodis Blaze, MD  bismuth subsalicylate (PEPTO BISMOL) 262 MG/15ML suspension Place 30 mLs into feeding tube every 4 (four) hours as needed for indigestion. 01/11/15  Yes Venetia Maxon Rama, MD  calcium citrate-vitamin D (CITRACAL+D) 315-200 MG-UNIT per tablet Take 1 tablet by mouth at bedtime.    Yes Historical Provider, MD  fentaNYL (DURAGESIC - DOSED MCG/HR) 50 MCG/HR Place 50 mcg onto the skin every 3 (three) days.   Yes Historical Provider, MD  glimepiride (AMARYL) 4 MG tablet Take 4 mg by mouth at bedtime. Take before meals   Yes Historical Provider, MD  HYDROmorphone (DILAUDID) 4 MG tablet Take 1 tablet (4 mg total) by mouth every 8 (eight) hours as needed for severe pain. 09/13/15  Yes Theodis Blaze, MD  LORazepam (ATIVAN) 0.5 MG tablet Take 1 table by mouth every 12 hours as needed for anxiety 09/13/15  Yes Theodis Blaze, MD  metFORMIN (GLUCOPHAGE) 500 MG tablet Take 500 mg by mouth 2 (two) times daily with a meal.   Yes Historical Provider, MD  Multiple Vitamins-Iron (MULTIVITAMINS WITH IRON) TABS tablet Take 1 tablet by mouth daily after supper. 12/19/14  Yes Nishant Dhungel, MD  pantoprazole (PROTONIX) 40 MG tablet Take 1 tablet (40 mg total) by mouth 2 (two) times daily. 12/19/14  Yes Nishant Dhungel, MD  potassium chloride SA (K-DUR,KLOR-CON) 20 MEQ tablet Take 2 tablets (40 mEq total) by mouth daily. 12/19/14  Yes Nishant Dhungel, MD  pregabalin (LYRICA) 100 MG capsule Take 100 mg by mouth 2 (two) times daily.   Yes Historical Provider, MD  prochlorperazine (COMPAZINE) 10 MG tablet Take 1 tablet (10 mg total) by mouth every 6 (six) hours as needed for nausea or vomiting. 02/09/15  Yes Earnstine Regal, PA-C  temazepam  (RESTORIL) 15 MG capsule Take 15 mg by mouth at bedtime as needed for sleep.   Yes Historical Provider, MD  apixaban (ELIQUIS) 5 MG TABS tablet Take 2 tablets (10 mg total) by mouth 2 (two) times daily. Please note to take 10 mg tablet twice per day until October 3rd, 2016. Starting October 4th, 2016 you will need to take Eliquis 5 mg twice daily 09/13/15 09/17/15  Theodis Blaze, MD  atorvastatin (LIPITOR) 80 MG tablet Take 80 mg by mouth at bedtime.     Historical Provider, MD  bisacodyl (DULCOLAX) 10 MG suppository Place 1 suppository (10 mg total) rectally daily as needed for moderate constipation. Patient not taking: Reported on 09/24/2015 09/13/15   Theodis Blaze, MD  budesonide-formoterol [facility]) 160-4.5 MCG/ACT inhaler Take 2 puffs first thing in am and then another 2 puffs about 12 hours later. Patient not taking: Reported on 09/24/2015 01/11/15   Venetia Maxon Rama, MD  DULoxetine (CYMBALTA) 20 MG capsule Take 1 capsule (20 mg total) by mouth daily. 01/11/15   Venetia Maxon Rama,  MD  fentaNYL (DURAGESIC - DOSED MCG/HR) 75 MCG/HR Place 1 patch (75 mcg total) onto the skin every 3 (three) days. Patient not taking: Reported on 09/24/2015 09/13/15   Theodis Blaze, MD    Physical Exam: Filed Vitals:   09/24/15 1817 09/24/15 2023 09/24/15 2211 09/24/15 2344  BP: 95/67 104/61 110/67 96/69  Pulse: 114 102 106 106  Temp:   98 F (36.7 C)   TempSrc: Oral  Oral   Resp: '18 18 13 16  '$ SpO2: 100% 95% 95% 94%    General: Alert, Awake and Oriented to Place and Person. Appear in mild distress Eyes: PERRL ENT: Oral Mucosa clear dry. Neck: no JVD Cardiovascular: S1 and S2 Present, no Murmur, Peripheral Pulses Present Respiratory: Bilateral Air entry equal and Decreased,  Clear to Auscultation, no Crackles, no wheezes Abdomen: Bowel Sound present, Soft and no tenderness Skin: no Rash Extremities: no Pedal edema, no calf tenderness Neurologic: Grossly no focal neuro deficit. Labs on Admission:   CBC:  Recent Labs Lab 09/24/15 1826  WBC 8.2  HGB 12.8*  HCT 38.1*  MCV 86.4  PLT 227    CMP     Component Value Date/Time   NA 135 09/24/2015 1826   NA 139 11/21/2014 1440   K 3.6 09/24/2015 1826   K 3.8 11/21/2014 1440   CL 98* 09/24/2015 1826   CO2 23 09/24/2015 1826   CO2 22 11/21/2014 1440   GLUCOSE 218* 09/24/2015 1826   GLUCOSE 150* 11/21/2014 1440   BUN 18 09/24/2015 1826   BUN 14.5 11/21/2014 1440   CREATININE 1.05 09/24/2015 1826   CREATININE 0.9 11/21/2014 1440   CALCIUM 9.6 09/24/2015 1826   CALCIUM 9.8 11/21/2014 1440   PROT 7.0 09/24/2015 2031   PROT 6.8 11/21/2014 1440   ALBUMIN 3.6 09/24/2015 2031   ALBUMIN 3.6 11/21/2014 1440   AST 18 09/24/2015 2031   AST 15 11/21/2014 1440   ALT 10* 09/24/2015 2031   ALT 18 11/21/2014 1440   ALKPHOS 93 09/24/2015 2031   ALKPHOS 95 11/21/2014 1440   BILITOT 1.1 09/24/2015 2031   BILITOT 0.83 11/21/2014 1440   GFRNONAA >60 09/24/2015 1826   GFRAA >60 09/24/2015 1826     Recent Labs Lab 09/24/15 2031  TROPONINI <0.03   BNP (last 3 results)  Recent Labs  09/08/15 2053  BNP 42.1    ProBNP (last 3 results) No results for input(s): PROBNP in the last 8760 hours.   Radiological Exams on Admission: Ct Head Wo Contrast  09/24/2015   CLINICAL DATA:  Falls.  Imbalance.  Per wife-has been seeing blood in his urine since Saturday-on blood thinners for previous blood clot, wfe reports increased imbalance, multiple falls  EXAM: CT HEAD WITHOUT CONTRAST  TECHNIQUE: Contiguous axial images were obtained from the base of the skull through the vertex without intravenous contrast.  COMPARISON:  09/09/2015  FINDINGS: Ventricles are normal in size and configuration. There are no parenchymal masses or mass effect. There is no evidence of an infarct. There are no extra-axial masses or abnormal fluid collections.  There is no intracranial hemorrhage.  Visualized sinuses are essentially clear. Clear mastoid air cells. No  skull lesion.  IMPRESSION: Normal unenhanced CT scan of the brain.   Electronically Signed   By: Lajean Manes M.D.   On: 09/24/2015 21:26   Assessment/Plan 1. Polypharmacy Patient presents with complaints of recurrent fall as well as confusion. Recently patient had multiple medication changes, also patient was on Lyrica before  his recent hospitalization and was not given that during the hospitalization and then it was resumed after going home. Currently patient presents with recurrent fall as well as confusion. CT scan is unremarkable as well as his blood work does not show any acute abnormality. Neurological examination is also showing no focal deficit. Urinalysis is also clear. With this the suspicion for patient's current presentation is most likely polypharmacy. Currently I would discontinue Cymbalta, hold off on Lyrica. I would also discontinue temazepam and continue only with lorazepam. I would also reduce patient's fentanyl patch from 75 g to 50 g as already planned outpatient. I would also give him IV gentle hydration. We'll monitor him closely on telemetry. Should the patient's mental status improved with above mentioned changes reintroduction of Lyrica at half dose is recommended. Otherwise patient will require further neurological workup regarding his confusion.  2.COPD mixed type [facility]) Continuing home inhalers. At present does not appear to be having any acute flareup.  3  Diabetes mellitus ([place]) Placing the patient on insulin sliding scale. Holding oral hypoglycemic agents.  4 GERD. Continuing PPI twice a day.  5  Protein-calorie malnutrition, severe ([place]) Continue close monitoring.  6  Pulmonary embolism [facility]) Patient's wife has concerns about possible hematuria although his urinalysis is clear here and also does not have any evidence of hemoglobin on the urine dipstick. Recommended wife to monitor closely and currently we will continue with adequate.  7   Recurrent falls Etiology unclear but most likely combination of delirium, polypharmacy, poor oral intake leading orthostatic hypotension. We will give gentle IV hydration. Check orthostatic vitals in the morning. PTOT consultation.  Nutrition: Full diabetic diet advance as tolerated DVT Prophylaxis: on therapeutic anticoagulation.  Advance goals of care discussion: DNR/DNI   Family Communication: family was present at bedside, opportunity was given to ask question and all questions were answered satisfactorily at the time of interview. Disposition: Admitted as observation telemetry unit.  Author: Berle Mull, MD [facility] Pager: [phone] 09/24/2015  If 7PM-7AM, please contact night-coverage [email or web address] Password [email or web address]

## 2015-09-24 NOTE — ED Notes (Signed)
MD at bedside. 

## 2015-09-24 NOTE — ED Provider Notes (Signed)
CSN: [id number]     Arrival date & time 09/24/15  1755 History   First MD Initiated Contact with Patient 09/24/15 1929     Chief Complaint  Patient presents with  . Hematuria     (Consider location/radiation/quality/duration/timing/severity/associated sxs/prior Treatment) HPI Fell 2 times yesterday and twice last night. Tries to get out of bed then falls over walker. At time of D/C from last hospitalization, was standing and walking without walker. Last week had significant loss of appetite, then fell Saturday but patient refused to come to hospital. Blood tinged urine for 2 days, patient has been urinating in his underwear without prior history of incontinence. Wife shows me an example of his underwear that she thought were blood tinged. The undergarment actually appears to have slightly dark urine stains but is not blood tinged. One of her main concerns was that his anticoagulant was causing him to bleed in his bladder and does lose too much blood. Confused speech yesterday. No fever. No cough. No vomiting. No diarrhea. Past Medical History  Diagnosis Date  . DM (diabetes mellitus) ([place])   . Pneumonia   . Hypercholesteremia   . Melanoma ([place])   . Radiation 08/03/14-08/23/14    35 gray to right chest  . Lung cancer ([place])   . FH: chemotherapy   . Gastroparesis 02/09/2015  . Cholecystoduodenal fistula   . Pulmonary embolus ([place])   . Duodenal ulcer   . COPD (chronic obstructive pulmonary disease) ([place])   . Protein calorie malnutrition ([place])   . Elevated LFTs   . C. difficile enteritis   . Anemia    Past Surgical History  Procedure Laterality Date  . Video bronchoscopy Bilateral 07/20/2014    Procedure: VIDEO BRONCHOSCOPY WITHOUT FLUORO;  Surgeon: Tanda Rockers, MD;  [facility]: [facility];  Service: Cardiopulmonary;  Laterality: Bilateral;  . Esophagogastroduodenoscopy N/A 11/30/2014    Procedure: ESOPHAGOGASTRODUODENOSCOPY (EGD);  Surgeon: [name], MD;  Location: [facility];   Service: Endoscopy;  Laterality: N/A;  . Laparotomy N/A 11/30/2014    Procedure: EXPLORATORY LAPAROTOMY, PYLOROPLASTY, OVERSEWING OF POSTERIOR DUODENUM, DUODENOSTOMY, CHOLECYSTECTOMY, JEJEUNOSTOMY;  Surgeon: Jackolyn Confer, MD;  Location: [facility];  Service: General;  Laterality: N/A;  . Pyloroplasty    . Cholecystectomy    . Duodenostomy tube    . Jejunostomy feeding tube     Family History  Problem Relation Age of Onset  . Heart disease Mother   . Cancer Mother     ? type   Social History  Substance Use Topics  . Smoking status: Former Smoker -- 2.00 packs/day for 50 years    Types: Cigarettes, Cigars    Quit date: 06/12/2014  . Smokeless tobacco: Former Systems developer    Types: [place] date: 09/07/2012  . Alcohol Use: No    Review of Systems 10 Systems reviewed and are negative for acute change except as noted in the HPI.   Allergies  Review of patient's allergies indicates no known allergies.  Home Medications   Prior to Admission medications   Medication Sig Start Date End Date Taking? Authorizing Provider  apixaban (ELIQUIS) 5 MG TABS tablet Take 1 tablet (5 mg total) by mouth 2 (two) times daily. 09/18/15  Yes Theodis Blaze, MD  atorvastatin (LIPITOR) 80 MG tablet Take 80 mg by mouth at bedtime.    Yes Historical Provider, MD  bismuth subsalicylate (PEPTO BISMOL) 262 MG/15ML suspension Place 30 mLs into feeding tube every 4 (four) hours as needed for indigestion.  01/11/15  Yes Venetia Maxon Rama, MD  calcium citrate-vitamin D (CITRACAL+D) 315-200 MG-UNIT per tablet Take 1 tablet by mouth at bedtime.    Yes Historical Provider, MD  DULoxetine (CYMBALTA) 20 MG capsule Take 1 capsule (20 mg total) by mouth daily. 01/11/15  Yes Christina P Rama, MD  fentaNYL (DURAGESIC - DOSED MCG/HR) 50 MCG/HR Place 50 mcg onto the skin every 3 (three) days.   Yes Historical Provider, MD  glimepiride (AMARYL) 4 MG tablet Take 4 mg by mouth at bedtime. Take before meals   Yes Historical Provider,  MD  HYDROmorphone (DILAUDID) 4 MG tablet Take 1 tablet (4 mg total) by mouth every 8 (eight) hours as needed for severe pain. 09/13/15  Yes Theodis Blaze, MD  LORazepam (ATIVAN) 0.5 MG tablet Take 1 table by mouth every 12 hours as needed for anxiety 09/13/15  Yes Theodis Blaze, MD  metFORMIN (GLUCOPHAGE) 500 MG tablet Take 500 mg by mouth 2 (two) times daily with a meal.   Yes Historical Provider, MD  Multiple Vitamins-Iron (MULTIVITAMINS WITH IRON) TABS tablet Take 1 tablet by mouth daily after supper. 12/19/14  Yes Nishant Dhungel, MD  pantoprazole (PROTONIX) 40 MG tablet Take 1 tablet (40 mg total) by mouth 2 (two) times daily. 12/19/14  Yes Nishant Dhungel, MD  potassium chloride SA (K-DUR,KLOR-CON) 20 MEQ tablet Take 2 tablets (40 mEq total) by mouth daily. 12/19/14  Yes Nishant Dhungel, MD  prochlorperazine (COMPAZINE) 10 MG tablet Take 1 tablet (10 mg total) by mouth every 6 (six) hours as needed for nausea or vomiting. 02/09/15  Yes Earnstine Regal, PA-C  temazepam (RESTORIL) 15 MG capsule Take 15 mg by mouth at bedtime as needed for sleep.   Yes Historical Provider, MD  apixaban (ELIQUIS) 5 MG TABS tablet Take 2 tablets (10 mg total) by mouth 2 (two) times daily. Please note to take 10 mg tablet twice per day until October 3rd, 2016. Starting October 4th, 2016 you will need to take Eliquis 5 mg twice daily 09/13/15 09/17/15  Theodis Blaze, MD  bisacodyl (DULCOLAX) 10 MG suppository Place 1 suppository (10 mg total) rectally daily as needed for moderate constipation. Patient not taking: Reported on 09/24/2015 09/13/15   Theodis Blaze, MD  budesonide-formoterol [facility]) 160-4.5 MCG/ACT inhaler Take 2 puffs first thing in am and then another 2 puffs about 12 hours later. Patient not taking: Reported on 09/24/2015 01/11/15   Venetia Maxon Rama, MD  fentaNYL (DURAGESIC - DOSED MCG/HR) 75 MCG/HR Place 1 patch (75 mcg total) onto the skin every 3 (three) days. Patient not taking: Reported on 09/24/2015  09/13/15   Theodis Blaze, MD   BP 110/67 mmHg  Pulse 106  Temp(Src) 98 F (36.7 C) (Oral)  Resp 13  SpO2 95% Physical Exam  Constitutional: He appears well-developed and well-nourished.  Patient is nontoxic in appearance. He has no respiratory distress.  HENT:  Head: Normocephalic and atraumatic.  Nose: Nose normal.  Mouth/Throat: Oropharynx is clear and moist.  Eyes: EOM are normal. Pupils are equal, round, and reactive to light. No scleral icterus.  Neck: Neck supple.  Cardiovascular: Regular rhythm, normal heart sounds and intact distal pulses.   Tachycardia  Pulmonary/Chest: Effort normal and breath sounds normal.  Abdominal: Soft. Bowel sounds are normal. He exhibits no distension. There is no tenderness.  Musculoskeletal: Normal range of motion. He exhibits no edema.  Neurological: He is alert. He has normal strength. No cranial nerve deficit. He exhibits normal muscle tone.  Coordination normal. GCS eye subscore is 4. GCS verbal subscore is 5. GCS motor subscore is 6.  Patient is confused. Speech is not consistently oriented to situation. There is picking behavior in trying to remove monitors and cords.  Skin: Skin is warm, dry and intact.  Psychiatric:  Patient is calm.    ED Course  Procedures (including critical care time) Labs Review Labs Reviewed  BASIC METABOLIC PANEL - Abnormal; Notable for the following:    Chloride 98 (*)    Glucose, Bld 218 (*)    All other components within normal limits  CBC - Abnormal; Notable for the following:    Hemoglobin 12.8 (*)    HCT 38.1 (*)    All other components within normal limits  URINALYSIS, ROUTINE W REFLEX MICROSCOPIC (NOT AT [facility]) - Abnormal; Notable for the following:    APPearance CLOUDY (*)    Bilirubin Urine MODERATE (*)    Leukocytes, UA TRACE (*)    All other components within normal limits  HEPATIC FUNCTION PANEL - Abnormal; Notable for the following:    ALT 10 (*)    All other components within normal limits   URINE MICROSCOPIC-ADD ON - Abnormal; Notable for the following:    Squamous Epithelial / LPF FEW (*)    Bacteria, UA FEW (*)    Casts HYALINE CASTS (*)    All other components within normal limits  URINE CULTURE  LIPASE, BLOOD  TROPONIN I  AMMONIA    Imaging Review Ct Head Wo Contrast  09/24/2015   CLINICAL DATA:  Falls.  Imbalance.  Per wife-has been seeing blood in his urine since Saturday-on blood thinners for previous blood clot, wfe reports increased imbalance, multiple falls  EXAM: CT HEAD WITHOUT CONTRAST  TECHNIQUE: Contiguous axial images were obtained from the base of the skull through the vertex without intravenous contrast.  COMPARISON:  09/09/2015  FINDINGS: Ventricles are normal in size and configuration. There are no parenchymal masses or mass effect. There is no evidence of an infarct. There are no extra-axial masses or abnormal fluid collections.  There is no intracranial hemorrhage.  Visualized sinuses are essentially clear. Clear mastoid air cells. No skull lesion.  IMPRESSION: Normal unenhanced CT scan of the brain.   Electronically Signed   By: Lajean Manes M.D.   On: 09/24/2015 21:26   I have personally reviewed and evaluated these images and lab results as part of my medical decision-making.   EKG Interpretation None      MDM   Final diagnoses:  Delirium  Gait instability  Severe comorbid illness   Patient is alert but confused. His wife describes recurrent falling, loss of continence and confusion. Findings are suggestive of delirium. This time there does not appear to be infectious source. Patient has known lung cancer. CT head does not show obvious abnormality. Consideration is given to possible metastatic disease. He is also on chronic pain control with Dilaudid and fentanyl. He has had a decrease in his fentanyl dosing from 75 g patch to 50 g patch. His wife reports he has consistently been getting his Ativan twice a day. At this time by history of  present illness her is not obvious reason for withdrawal syndrome however this is a consideration. The patient will be admitted for ongoing monitoring and evaluation for delirium and gait instability.    Charlesetta Shanks, MD 09/24/15 [phone]

## 2015-09-24 NOTE — ED Notes (Signed)
Bed: WA18 Expected date:  Expected time:  Means of arrival:  Comments: Hold for triage 2 

## 2015-09-25 ENCOUNTER — Encounter (HOSPITAL_COMMUNITY): Payer: Self-pay | Admitting: *Deleted

## 2015-09-25 DIAGNOSIS — E43 Unspecified severe protein-calorie malnutrition: Secondary | ICD-10-CM

## 2015-09-25 DIAGNOSIS — E785 Hyperlipidemia, unspecified: Secondary | ICD-10-CM

## 2015-09-25 DIAGNOSIS — R296 Repeated falls: Secondary | ICD-10-CM

## 2015-09-25 DIAGNOSIS — E876 Hypokalemia: Secondary | ICD-10-CM

## 2015-09-25 DIAGNOSIS — E119 Type 2 diabetes mellitus without complications: Secondary | ICD-10-CM

## 2015-09-25 DIAGNOSIS — G9340 Encephalopathy, unspecified: Principal | ICD-10-CM

## 2015-09-25 DIAGNOSIS — Z79899 Other long term (current) drug therapy: Secondary | ICD-10-CM

## 2015-09-25 DIAGNOSIS — I2782 Chronic pulmonary embolism: Secondary | ICD-10-CM

## 2015-09-25 DIAGNOSIS — E1169 Type 2 diabetes mellitus with other specified complication: Secondary | ICD-10-CM

## 2015-09-25 DIAGNOSIS — N390 Urinary tract infection, site not specified: Secondary | ICD-10-CM

## 2015-09-25 LAB — GLUCOSE, CAPILLARY
GLUCOSE-CAPILLARY: 118 mg/dL — AB (ref 65–99)
GLUCOSE-CAPILLARY: 139 mg/dL — AB (ref 65–99)
GLUCOSE-CAPILLARY: 140 mg/dL — AB (ref 65–99)
Glucose-Capillary: 119 mg/dL — ABNORMAL HIGH (ref 65–99)
Glucose-Capillary: 99 mg/dL (ref 65–99)

## 2015-09-25 LAB — COMPREHENSIVE METABOLIC PANEL
ALBUMIN: 3.4 g/dL — AB (ref 3.5–5.0)
ALK PHOS: 87 U/L (ref 38–126)
ALT: 10 U/L — AB (ref 17–63)
ANION GAP: 8 (ref 5–15)
AST: 18 U/L (ref 15–41)
BUN: 14 mg/dL (ref 6–20)
CALCIUM: 9.1 mg/dL (ref 8.9–10.3)
CHLORIDE: 104 mmol/L (ref 101–111)
CO2: 27 mmol/L (ref 22–32)
Creatinine, Ser: 0.88 mg/dL (ref 0.61–1.24)
GFR calc Af Amer: >60 mL/min (ref >60)
GFR calc non Af Amer: >60 mL/min (ref >60)
GLUCOSE: 173 mg/dL — AB (ref 65–99)
Potassium: 3.1 mmol/L — ABNORMAL LOW (ref 3.5–5.1)
SODIUM: 139 mmol/L (ref 135–145)
Total Bilirubin: 0.8 mg/dL (ref 0.3–1.2)
Total Protein: 6.9 g/dL (ref 6.5–8.1)

## 2015-09-25 LAB — CBC
HCT: 32.5 % — ABNORMAL LOW (ref 39.0–52.0)
HEMOGLOBIN: 10.9 g/dL — AB (ref 13.0–17.0)
MCH: 29.1 pg (ref 26.0–34.0)
MCHC: 33.5 g/dL (ref 30.0–36.0)
MCV: 86.9 fL (ref 78.0–100.0)
Platelets: 191 10*3/uL (ref 150–400)
RBC: 3.74 MIL/uL — ABNORMAL LOW (ref 4.22–5.81)
RDW: 13.2 % (ref 11.5–15.5)
WBC: 5.4 10*3/uL (ref 4.0–10.5)

## 2015-09-25 LAB — PROTIME-INR
INR: 1.52 — ABNORMAL HIGH (ref 0.00–1.49)
PROTHROMBIN TIME: 18.3 s — AB (ref 11.6–15.2)

## 2015-09-25 MED ORDER — POTASSIUM CHLORIDE CRYS ER 20 MEQ PO TBCR
40.0000 meq | EXTENDED_RELEASE_TABLET | Freq: Once | ORAL | Status: AC
  Administered 2015-09-25: 40 meq via ORAL
  Filled 2015-09-25: qty 2

## 2015-09-25 MED ORDER — ONDANSETRON HCL 4 MG PO TABS: 4.0000 mg | ORAL_TABLET | Freq: Four times a day (QID) | ORAL | Status: DC | PRN

## 2015-09-25 MED ORDER — PANTOPRAZOLE SODIUM 40 MG PO TBEC
40.0000 mg | DELAYED_RELEASE_TABLET | Freq: Two times a day (BID) | ORAL | Status: DC
  Administered 2015-09-25 – 2015-10-01 (×13): 40 mg via ORAL
  Filled 2015-09-25 (×13): qty 1

## 2015-09-25 MED ORDER — LORAZEPAM 0.5 MG PO TABS
0.5000 mg | ORAL_TABLET | Freq: Two times a day (BID) | ORAL | Status: DC | PRN
  Administered 2015-09-25 – 2015-09-28 (×5): 0.5 mg via ORAL
  Filled 2015-09-25 (×6): qty 1

## 2015-09-25 MED ORDER — SODIUM CHLORIDE 0.9 % IV SOLN
INTRAVENOUS | Status: DC
  Administered 2015-09-25: 01:00:00 via INTRAVENOUS

## 2015-09-25 MED ORDER — HYDROMORPHONE HCL 4 MG PO TABS
4.0000 mg | ORAL_TABLET | Freq: Three times a day (TID) | ORAL | Status: DC | PRN
  Administered 2015-09-25: 4 mg via ORAL
  Filled 2015-09-25: qty 1

## 2015-09-25 MED ORDER — FENTANYL 50 MCG/HR TD PT72
50.0000 ug | MEDICATED_PATCH | TRANSDERMAL | Status: DC
  Administered 2015-09-25 – 2015-09-28 (×2): 50 ug via TRANSDERMAL
  Filled 2015-09-25 (×2): qty 1

## 2015-09-25 MED ORDER — LORAZEPAM 2 MG/ML IJ SOLN
0.5000 mg | Freq: Once | INTRAMUSCULAR | Status: AC
  Administered 2015-09-25: 0.5 mg via INTRAVENOUS
  Filled 2015-09-25: qty 1

## 2015-09-25 MED ORDER — SODIUM CHLORIDE 0.9 % IJ SOLN
3.0000 mL | Freq: Two times a day (BID) | INTRAMUSCULAR | Status: DC
Start: 2015-09-25 — End: 2015-10-01
  Administered 2015-09-25 – 2015-09-30 (×6): 3 mL via INTRAVENOUS

## 2015-09-25 MED ORDER — DEXTROSE 5 % IV SOLN
1.0000 g | Freq: Every day | INTRAVENOUS | Status: DC
  Administered 2015-09-25 – 2015-09-30 (×6): 1 g via INTRAVENOUS
  Filled 2015-09-25 (×7): qty 10

## 2015-09-25 MED ORDER — ACETAMINOPHEN 650 MG RE SUPP: 650.0000 mg | Freq: Four times a day (QID) | RECTAL | Status: DC | PRN

## 2015-09-25 MED ORDER — ATORVASTATIN CALCIUM 40 MG PO TABS
80.0000 mg | ORAL_TABLET | Freq: Every day | ORAL | Status: DC
  Administered 2015-09-25 – 2015-09-30 (×6): 80 mg via ORAL
  Filled 2015-09-25 (×7): qty 2

## 2015-09-25 MED ORDER — ONDANSETRON HCL 4 MG/2ML IJ SOLN
4.0000 mg | Freq: Four times a day (QID) | INTRAMUSCULAR | Status: DC | PRN
  Administered 2015-09-29: 4 mg via INTRAVENOUS
  Filled 2015-09-25: qty 2

## 2015-09-25 MED ORDER — APIXABAN 2.5 MG PO TABS: 5.0000 mg | ORAL_TABLET | Freq: Two times a day (BID) | ORAL | Status: DC

## 2015-09-25 MED ORDER — INSULIN ASPART 100 UNIT/ML ~~LOC~~ SOLN
0.0000 [IU] | Freq: Every day | SUBCUTANEOUS | Status: DC
  Administered 2015-09-29: 2 [IU] via SUBCUTANEOUS

## 2015-09-25 MED ORDER — ENSURE ENLIVE PO LIQD
237.0000 mL | Freq: Two times a day (BID) | ORAL | Status: DC
  Administered 2015-09-26 – 2015-09-30 (×4): 237 mL via ORAL

## 2015-09-25 MED ORDER — ACETAMINOPHEN 325 MG PO TABS
650.0000 mg | ORAL_TABLET | Freq: Four times a day (QID) | ORAL | Status: DC | PRN
  Administered 2015-09-25 – 2015-10-01 (×4): 650 mg via ORAL
  Filled 2015-09-25 (×4): qty 2

## 2015-09-25 MED ORDER — INSULIN ASPART 100 UNIT/ML ~~LOC~~ SOLN
0.0000 [IU] | Freq: Three times a day (TID) | SUBCUTANEOUS | Status: DC
  Administered 2015-09-25 – 2015-09-26 (×3): 2 [IU] via SUBCUTANEOUS
  Administered 2015-09-26 – 2015-09-28 (×3): 3 [IU] via SUBCUTANEOUS
  Administered 2015-09-29: 2 [IU] via SUBCUTANEOUS
  Administered 2015-09-30: 3 [IU] via SUBCUTANEOUS
  Administered 2015-10-01: 2 [IU] via SUBCUTANEOUS

## 2015-09-25 MED ORDER — BISMUTH SUBSALICYLATE 262 MG/15ML PO SUSP
30.0000 mL | ORAL | Status: DC | PRN
  Filled 2015-09-25: qty 118

## 2015-09-25 MED ORDER — APIXABAN 2.5 MG PO TABS
5.0000 mg | ORAL_TABLET | Freq: Two times a day (BID) | ORAL | Status: DC
  Administered 2015-09-25 – 2015-10-01 (×13): 5 mg via ORAL
  Filled 2015-09-25 (×13): qty 2

## 2015-09-25 NOTE — Progress Notes (Signed)
Initial Nutrition Assessment  DOCUMENTATION CODES:   Not applicable  INTERVENTION:  - Will order Ensure Enlive BID, each supplement provides 350 kcal and 20 grams of protein - Encourage intakes of meals and supplements - RD will continue to monitor for needs  NUTRITION DIAGNOSIS:   Inadequate oral intake related to lethargy/confusion, acute illness as evidenced by meal completion < 50%, other (see comment) (report from tech).  GOAL:   Patient will meet greater than or equal to 90% of their needs  MONITOR:   PO intake, Supplement acceptance, Weight trends, Labs, I & O's  REASON FOR ASSESSMENT:   Consult Assessment of nutrition requirement/status  ASSESSMENT:   69 y.o. male with Past medical history of lung cancer currently on observation, diabetes mellitus, dyslipidemia, recent pulmonary embolism on anticoagulation, COPD, anemia, protein malnutrition.  Pt seen for consult. BMI indicates normal weight status. Pt confused at time of visit and mainly mumbles; unable to provide reliable information and no family/visitors present at this time. Physical assessment not done at this time but physical done 09/12/15 indicated no muscle or fat wasting; will attempt to do physical assessment at follow-up.  Lunch tray had just arrived at time of RD visit. Tech in the room states sitter reported pt ate a few bites of breakfast but that he did not eat much. Unsure if pt was meeting needs PTA. Per chart review, pt gained 38 lbs from 02/07/15-08/03/15 and gained 4 lbs from 08/03/15-09/13/15; pt then subsequently lost 20 lbs (12% body weight) over the past 12 days which is significant for time frame. Will continue to monitor weight trends.   Will order Ensure Enlive BID to supplement. Medications reviewed. Labs reviewed; CBGS: 93-134 mg/dL, K: 3.1 mmol/L.   Diet Order:  Diet Carb Modified Fluid consistency:: Thin; Room service appropriate?: Yes  Skin:  Reviewed, no issues  Last BM:   10/7  Height:   Ht Readings from Last 1 Encounters:  09/25/15 '5\' 8"'$  (1.727 m)    Weight:   Wt Readings from Last 1 Encounters:  09/25/15 152 lb 12.8 oz (69.31 kg)    Ideal Body Weight:  70 kg (kg)  BMI:  Body mass index is 23.24 kg/(m^2).  Estimated Nutritional Needs:   Kcal:  7322-0254  Protein:  80-90 grams  Fluid:  2 L/day  EDUCATION NEEDS:   No education needs identified at this time     Jarome Matin, RD, LDN Inpatient Clinical Dietitian Pager # [phone] After hours/weekend pager # [phone]

## 2015-09-25 NOTE — Progress Notes (Addendum)
Patient ID: Jermey Closs, male   DOB: Nov 11, 1946, 69 y.o.   MRN: [id number] [facility] PROGRESS NOTE  Tyhir Schwan VFI:[id number] DOB: Oct 10, 1946 DOA: 09/24/2015 PCP: [name], MD  Brief narrative:    69 year old male with past medical history of lung cancer on observation, diabetes mellitus, dyslipidemia, recent diagnosis of pulmonary embolism (underwent TPA) on anticoagulation with Eliquis, hospitalization in September of 2016 for respiratory failure with hypoxia thought to be secondary to pulmonary embolism. Patient presented to [facility] ED with confusion and recurrent falls. On admission, patient was noted to have urinalysis with trace leukocytes. Blood work was essentially unremarkable. CT head showed no acute intracranial findings.  Anticipated discharge: Needs physical therapy evaluation for safe discharge plan. We also need to follow-up on final urine culture results. Anticipate discharge by 09/27/2015.  Assessment/Plan:    Principal Problem: Acute encephalopathy / Recurrent falls  - Likely secondary to polypharmacy. Pt on multiple meds including cymbalta, lyrica, pain meds.. - This morning, he is minimally verbal. He seems to be confused. - May continue fentanyl patch but discontinue Dilaudid. - CT head on the admission did not show acute intracranial findings - Continue sitter at bedside, fall precaution - Obtain physical therapy evaluation  Active Problems: Pulmonary embolism (HCC) - Continue anticoagulation with apixaban - No current evidence of bleeding - Hemoglobin is 10.9  Hypokalemia - Likely from poor by mouth intake - Supplemented  Urinary tract infection - Trace leukocytes seen on urinalysis on the admission. Will start Rocephin and follow up urine culture results.  Dyslipidemia associated with type 2 diabetes  - Resume Lipitor 80 mg daily  Diabetes mellitus without complications - Patient is currently on sliding scale insulin only. - His  CBG is 118 - Once CBG's better than will resume amaryl and metformin  Protein-calorie malnutrition, severe ([place]) - Nutrition consulted      DVT Prophylaxis  - On anticoagulation with apixaban   Code Status: DNR/DNI Family Communication:  Family not at the bedside this morning Disposition Plan: Needs physical therapy evaluation for safe discharge plan. Anticipate discharge by 09/27/2015.  IV access:  Peripheral IV  Procedures and diagnostic studies:    Ct Head Wo Contrast 09/24/2015  Normal unenhanced CT scan of the brain.   Electronically Signed   By: Lajean Manes M.D.   On: 09/24/2015 21:26    Medical Consultants:  None   Other Consultants:  Physical therapy Nutrition  IAnti-Infectives:   Rocephin 09/25/2015 -->    Leisa Lenz, MD  [facility] Pager [phone]  Time spent in minutes: 25 minutes  If 7PM-7AM, please contact night-coverage [email or web address] Password [email or web address] 09/25/2015, 10:50 AM   LOS: 1 day    HPI/Subjective: No acute overnight events. No respiratory distress.   Objective: Filed Vitals:   09/24/15 2344 09/25/15 0000 09/25/15 0011 09/25/15 0603  BP: 96/69 105/67  92/63  Pulse: 106 105  95  Temp:  98.1 F (36.7 C)  98 F (36.7 C)  TempSrc:  Oral  Oral  Resp: 16   16  Height:   '5\' 8"'$  (1.727 m)   Weight:   69.31 kg (152 lb 12.8 oz)   SpO2: 94% 94%  94%    Intake/Output Summary (Last 24 hours) at 09/25/15 1050 Last data filed at 09/25/15 0847  Gross per 24 hour  Intake    240 ml  Output    100 ml  Net    140 ml    Exam:   General:  Pt is not very  verbal this am, seems confused   Cardiovascular: Regular rate and rhythm, S1/S2, no murmurs  Respiratory: Clear to auscultation bilaterally, no wheezing, no crackles, no rhonchi  Abdomen: Soft, non tender, non distended, bowel sounds present  Extremities: No edema, pulses DP and PT palpable bilaterally  Neuro: Grossly nonfocal  Data Reviewed: Basic Metabolic Panel:  Recent  Labs Lab 09/24/15 1826 09/25/15 0528  NA 135 139  K 3.6 3.1*  CL 98* 104  CO2 23 27  GLUCOSE 218* 173*  BUN 18 14  CREATININE 1.05 0.88  CALCIUM 9.6 9.1   Liver Function Tests:  Recent Labs Lab 09/24/15 2031 09/25/15 0528  AST 18 18  ALT 10* 10*  ALKPHOS 93 87  BILITOT 1.1 0.8  PROT 7.0 6.9  ALBUMIN 3.6 3.4*    Recent Labs Lab 09/24/15 2031  LIPASE 25    Recent Labs Lab 09/24/15 2031  AMMONIA 27   CBC:  Recent Labs Lab 09/24/15 1826 09/25/15 0528  WBC 8.2 5.4  HGB 12.8* 10.9*  HCT 38.1* 32.5*  MCV 86.4 86.9  PLT 227 191   Cardiac Enzymes:  Recent Labs Lab 09/24/15 2031  TROPONINI <0.03   BNP: Invalid input(s): POCBNP CBG:  Recent Labs Lab 09/25/15 0054 09/25/15 0740  GLUCAP 119* 118*    No results found for this or any previous visit (from the past 240 hour(s)).   Scheduled Meds: . apixaban  5 mg Oral BID  . fentaNYL  50 mcg Transdermal Q72H  . insulin aspart  0-15 Units Subcutaneous TID WC  . insulin aspart  0-5 Units Subcutaneous QHS  . pantoprazole  40 mg Oral BID  . potassium chloride  40 mEq Oral Once  . sodium chloride  3 mL Intravenous Q12H   Continuous Infusions: . sodium chloride 100 mL/hr at 09/25/15 0052

## 2015-09-25 NOTE — Progress Notes (Addendum)
ANTICOAGULATION CONSULT NOTE - Initial Consult  Pharmacy Consult for Eliquis Indication: pulmonary embolus  No Known Allergies  Patient Measurements: Height: '5\' 8"'$  (172.7 cm) Weight: 152 lb 12.8 oz (69.31 kg) IBW/kg (Calculated) : 68.4  Vital Signs: Temp: 98.1 F (36.7 C) (10/11 0000) Temp Source: Oral (10/11 0000) BP: 105/67 mmHg (10/11 0000) Pulse Rate: 105 (10/11 0000)  Labs:  Recent Labs  09/24/15 1826 09/24/15 2031  HGB 12.8*  --   HCT 38.1*  --   PLT 227  --   CREATININE 1.05  --   TROPONINI  --  <0.03    Estimated Creatinine Clearance: 65.1 mL/min (by C-G formula based on Cr of 1.05).   Medical History: Past Medical History  Diagnosis Date  . DM (diabetes mellitus) ([place])   . Pneumonia   . Hypercholesteremia   . Melanoma ([place])   . Radiation 08/03/14-08/23/14    35 gray to right chest  . Lung cancer ([place])   . FH: chemotherapy   . Gastroparesis 02/09/2015  . Cholecystoduodenal fistula   . Pulmonary embolus ([place])   . Duodenal ulcer   . COPD (chronic obstructive pulmonary disease) ([place])   . Protein calorie malnutrition ([place])   . Elevated LFTs   . C. difficile enteritis   . Anemia     Medications:  Scheduled:  . fentaNYL  50 mcg Transdermal Q72H  . insulin aspart  0-15 Units Subcutaneous TID WC  . insulin aspart  0-5 Units Subcutaneous QHS  . pantoprazole  40 mg Oral BID  . sodium chloride  3 mL Intravenous Q12H   Infusions:  . sodium chloride 100 mL/hr at 09/25/15 8264    Assessment:  69 yr male presents with reports of confusion, recurrent falls and blood in the urine  PMH incluces lung cancer and recent diagnosis of pulmonary embolism, which was treated with TPA and then discharged on Eliquis.  PTA currently on Eliquis '5mg'$  BID (last reported dose was 10/10 @ 11am)  U/A shows yellow urine  Urine culture ordered  MD notes no observed blood or bleeding  Upon admission, pharmacy consulted to dose Eliquis for continued treatment of  PE  Goal of Therapy:  Full anticoagulation coverage Monitor platelets by anticoagulation protocol: Yes   Plan:   Eliquis '5mg'$  BID   Monitor for signs/symptoms of bleeding  Marenda Accardi, [place]io [place], PharmD 09/25/2015,3:35 AM

## 2015-09-26 DIAGNOSIS — E1169 Type 2 diabetes mellitus with other specified complication: Secondary | ICD-10-CM | POA: Diagnosis present

## 2015-09-26 DIAGNOSIS — J449 Chronic obstructive pulmonary disease, unspecified: Secondary | ICD-10-CM

## 2015-09-26 DIAGNOSIS — E1142 Type 2 diabetes mellitus with diabetic polyneuropathy: Secondary | ICD-10-CM

## 2015-09-26 DIAGNOSIS — I2699 Other pulmonary embolism without acute cor pulmonale: Secondary | ICD-10-CM

## 2015-09-26 DIAGNOSIS — E785 Hyperlipidemia, unspecified: Secondary | ICD-10-CM

## 2015-09-26 DIAGNOSIS — D6481 Anemia due to antineoplastic chemotherapy: Secondary | ICD-10-CM

## 2015-09-26 DIAGNOSIS — T451X5A Adverse effect of antineoplastic and immunosuppressive drugs, initial encounter: Secondary | ICD-10-CM

## 2015-09-26 DIAGNOSIS — D638 Anemia in other chronic diseases classified elsewhere: Secondary | ICD-10-CM

## 2015-09-26 DIAGNOSIS — E119 Type 2 diabetes mellitus without complications: Secondary | ICD-10-CM

## 2015-09-26 DIAGNOSIS — C3411 Malignant neoplasm of upper lobe, right bronchus or lung: Secondary | ICD-10-CM

## 2015-09-26 LAB — BASIC METABOLIC PANEL
Anion gap: 10 (ref 5–15)
BUN: 9 mg/dL (ref 6–20)
CHLORIDE: 104 mmol/L (ref 101–111)
CO2: 24 mmol/L (ref 22–32)
CREATININE: 0.95 mg/dL (ref 0.61–1.24)
Calcium: 9.1 mg/dL (ref 8.9–10.3)
GFR calc Af Amer: >60 mL/min (ref >60)
GFR calc non Af Amer: >60 mL/min (ref >60)
Glucose, Bld: 130 mg/dL — ABNORMAL HIGH (ref 65–99)
Potassium: 3.3 mmol/L — ABNORMAL LOW (ref 3.5–5.1)
SODIUM: 138 mmol/L (ref 135–145)

## 2015-09-26 LAB — GLUCOSE, CAPILLARY
GLUCOSE-CAPILLARY: 162 mg/dL — AB (ref 65–99)
Glucose-Capillary: 126 mg/dL — ABNORMAL HIGH (ref 65–99)
Glucose-Capillary: 147 mg/dL — ABNORMAL HIGH (ref 65–99)
Glucose-Capillary: 98 mg/dL (ref 65–99)

## 2015-09-26 LAB — URINE CULTURE: SPECIAL REQUESTS: NORMAL

## 2015-09-26 MED ORDER — PREGABALIN 50 MG PO CAPS
100.0000 mg | ORAL_CAPSULE | Freq: Every day | ORAL | Status: DC
  Administered 2015-09-26 – 2015-10-01 (×6): 100 mg via ORAL
  Filled 2015-09-26 (×6): qty 2

## 2015-09-26 MED ORDER — BUDESONIDE-FORMOTEROL FUMARATE 160-4.5 MCG/ACT IN AERO
2.0000 | INHALATION_SPRAY | Freq: Two times a day (BID) | RESPIRATORY_TRACT | Status: DC
  Filled 2015-09-26 (×2): qty 6

## 2015-09-26 MED ORDER — GLIMEPIRIDE 4 MG PO TABS
4.0000 mg | ORAL_TABLET | Freq: Every day | ORAL | Status: DC
  Administered 2015-09-26 – 2015-09-30 (×5): 4 mg via ORAL
  Filled 2015-09-26 (×6): qty 1

## 2015-09-26 MED ORDER — DULOXETINE HCL 20 MG PO CPEP
20.0000 mg | ORAL_CAPSULE | Freq: Every day | ORAL | Status: DC
  Administered 2015-09-26 – 2015-10-01 (×6): 20 mg via ORAL
  Filled 2015-09-26 (×8): qty 1

## 2015-09-26 NOTE — Evaluation (Signed)
Physical Therapy Evaluation Patient Details Name: Daryl Vega MRN: [id number] DOB: 1946/07/30 Today's Date: 09/26/2015   History of Present Illness  69 year old male with past medical history of lung cancer on observation, diabetes mellitus, dyslipidemia, recent diagnosis of pulmonary embolism (underwent TPA) on anticoagulation with Eliquis, hospitalization in September of 2016 for respiratory failure with hypoxia thought to be secondary to pulmonary embolism.  Pt admitted for acute encephalopathy likely due to polypharmacy.  Clinical Impression  Pt admitted with above diagnosis. Pt currently with functional limitations due to the deficits listed below (see PT Problem List).  Pt assisted with ambulating in hallway with RW for more support.  Pt occasionally min assist to steady.  Pt presents mostly with cognitive deficits and once cognition improves will likely not require f/u PT.  Would recommend supervision for mobility at this time. Pt will benefit from skilled PT to increase their independence and safety with mobility to allow discharge to the venue listed below.       Follow Up Recommendations Supervision for mobility/OOB;No PT follow up    Equipment Recommendations       Recommendations for Other Services       Precautions / Restrictions Precautions Precautions: Fall      Mobility  Bed Mobility Overal bed mobility: Needs Assistance Bed Mobility: Supine to Sit;Sit to Supine     Supine to sit: Supervision;HOB elevated Sit to supine: Supervision      Transfers Overall transfer level: Needs assistance Equipment used: Rolling walker (2 wheeled) Transfers: Sit to/from Stand Sit to Stand: Min assist         General transfer comment: verbal cues for safe technique, assist to steady upon rise  Ambulation/Gait Ambulation/Gait assistance: Min assist Ambulation Distance (Feet): 280 Feet Assistive device: Rolling walker (2 wheeled) Gait Pattern/deviations: Step-through  pattern;Trunk flexed     General Gait Details: verbal cues for safe use of RW, occasional assist to steady with challenges  Stairs            Wheelchair Mobility    Modified Rankin (Stroke Patients Only)       Balance Overall balance assessment: Needs assistance         Standing balance support: Bilateral upper extremity supported;During functional activity Standing balance-Leahy Scale: Poor                               Pertinent Vitals/Pain Pain Assessment: No/denies pain    Home Living   Living Arrangements: Spouse/significant other             Home Equipment: Walker - 2 wheels Additional Comments: pt reports above however poor historian    Prior Function           Comments: pt poor historian, hx of falls per chart     Hand Dominance        Extremity/Trunk Assessment               Lower Extremity Assessment: Overall WFL for tasks assessed         Communication   Communication: No difficulties  Cognition Arousal/Alertness: Awake/alert Behavior During Therapy: Flat affect Overall Cognitive Status: No family/caregiver present to determine baseline cognitive functioning Area of Impairment: Orientation Orientation Level: Disoriented to;Place;Time;Situation                  General Comments      Exercises        Assessment/Plan    PT  Assessment Patient needs continued PT services  PT Diagnosis Difficulty walking;Altered mental status   PT Problem List Decreased balance;Decreased mobility;Decreased knowledge of use of DME;Decreased safety awareness;Decreased cognition  PT Treatment Interventions DME instruction;Gait training;Functional mobility training;Patient/family education;Therapeutic activities;Therapeutic exercise;Balance training   PT Goals (Current goals can be found in the Care Plan section) Acute Rehab PT Goals PT Goal Formulation: With patient Time For Goal Achievement: 10/03/15 Potential to  Achieve Goals: Good    Frequency Min 3X/week   Barriers to discharge        Co-evaluation               End of Session Equipment Utilized During Treatment: Gait belt Activity Tolerance: Patient tolerated treatment well Patient left: in bed;with call bell/phone within reach;with nursing/sitter in room           Time: 0922-0939 PT Time Calculation (min) (ACUTE ONLY): 17 min   Charges:   PT Evaluation $Initial PT Evaluation Tier I: 1 Procedure     PT G Codes:        Talina Pleitez,KATHrine E 09/26/2015, 12:43 PM Carmelia Bake, PT, DPT 09/26/2015 Pager: [phone]

## 2015-09-26 NOTE — Progress Notes (Addendum)
Patient ID: Daryl Vega, male   DOB: Sep 22, 1946, 69 y.o.   MRN: [id number] [facility] PROGRESS NOTE  Daryl Vega [id number]:[id number] DOB: August 12, 1946 DOA: 09/24/2015 PCP: [name], MD  Brief narrative:    69 year old male with past medical history of lung cancer on observation, diabetes mellitus, dyslipidemia, recent diagnosis of pulmonary embolism (underwent TPA) on anticoagulation with Eliquis, hospitalization in September of 2016 for respiratory failure with hypoxia thought to be secondary to pulmonary embolism. Patient presented to [facility] ED with confusion and recurrent falls. On admission, patient was noted to have urinalysis with trace leukocytes. Blood work was essentially unremarkable. CT head showed no acute intracranial findings.  Anticipated discharge: Likely by 10/13 or 10/14 if urine cx back and if mental status better.   Assessment/Plan:    Principal Problem: Acute encephalopathy / Recurrent falls  - Secondary to polypharmacy since pt is taking multiple meds Cymbalta, lyrica, pain medications. Possibility also includes UTI. - Mental status better today so we will slowly introduce meds Cymbalta and lyrica. Lyrica given once a day instead of twice a day. - CT head on the admission did not show acute intracranial findings - Treated with rocephin for UTI. - PT evaluation pending   Active Problems: Urinary tract infection - UA showed leukocytes so pt started empirically on rocephin - Urine culture pending as of 10/12.  Pulmonary embolism (HCC) - Continue anticoagulation with apixaban - Drop in hemoglobin since admission, from 12.8 to 10.9 - Likely dilutional - Monitor CBC daily.   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   IV access:  Peripheral IV  Procedures and diagnostic studies:    Ct Head Wo Contrast 09/24/2015  Normal unenhanced CT scan of the brain.   Electronically Signed   By: Lajean Manes M.D.   On: 09/24/2015 21:26    Medical Consultants:  None   Other Consultants:  Physical therapy Nutrition  IAnti-Infectives:   Rocephin 09/25/2015 -->    Leisa Lenz, MD  [facility] Pager [phone]  Time spent in minutes: 25 minutes  If 7PM-7AM, please contact night-coverage [email or web address] Password [email or web address] 09/26/2015, 10:18 AM   LOS: 2 days    HPI/Subjective: No acute overnight events. Confused overnight.   Objective: Filed Vitals:   09/25/15 0603 09/25/15 2135 09/26/15 0435 09/26/15 0457  BP: 92/63 112/70  104/73  Pulse: 95 92  87  Temp: 98 F (36.7 C) 98.2 F (36.8 C)  97.7 F (36.5 C)  TempSrc: Oral Oral  Oral  Resp:  $'16 16  16  'G$ Height:      Weight:   69.4 kg (153 lb)   SpO2: 94% 95%  97%    Intake/Output Summary (Last 24 hours) at 09/26/15 1018 Last data filed at 09/25/15 2200  Gross per 24 hour  Intake    890 ml  Output    300 ml  Net    590 ml    Exam:   General:  Pt is more alert this am, no distress.    Cardiovascular: Rate controlled, (+) S1, S2   Respiratory: No wheezing, no crackles, no rhonchi  Abdomen: (+) BS, non tender   Extremities: No leg swelling, palpable pulses   Neuro: Nonfocal   Data Reviewed: Basic Metabolic Panel:  Recent Labs Lab 09/24/15 1826 09/25/15 0528  NA 135 139  K 3.6 3.1*  CL 98* 104  CO2 23 27  GLUCOSE 218* 173*  BUN 18 14  CREATININE 1.05 0.88  CALCIUM 9.6 9.1   Liver Function Tests:  Recent Labs Lab 09/24/15 2031 09/25/15 0528  AST 18 18  ALT 10* 10*  ALKPHOS 93 87  BILITOT 1.1 0.8  PROT 7.0 6.9  ALBUMIN 3.6 3.4*    Recent Labs Lab 09/24/15 2031  LIPASE 25    Recent Labs Lab 09/24/15 2031  AMMONIA 27   CBC:  Recent Labs Lab 09/24/15 1826 09/25/15 0528  WBC 8.2 5.4  HGB 12.8* 10.9*  HCT 38.1* 32.5*  MCV 86.4 86.9  PLT 227 191   Cardiac Enzymes:  Recent Labs Lab 09/24/15 2031  TROPONINI <0.03   BNP: Invalid input(s): POCBNP CBG:  Recent Labs Lab 09/25/15 0740 09/25/15 1212 09/25/15 1707 09/25/15 2134 09/26/15 0740  GLUCAP 118* 139* 140* 99 126*    Recent Results (from the past 240 hour(s))  Urine culture     Status: None (Preliminary result)   Collection Time: 09/24/15 10:04 PM  Result Value Ref Range Status   Specimen Description URINE, CLEAN CATCH  Final   Special Requests Normal  Final   Culture   Final    CULTURE REINCUBATED FOR BETTER GROWTH Performed at [facility]    Report Status PENDING  Incomplete     Scheduled Meds: . apixaban  5 mg Oral BID  . atorvastatin  80 mg Oral q1800  . cefTRIAXone (ROCEPHIN)  IV  1 g Intravenous Daily  . feeding supplement (ENSURE ENLIVE)  237 mL Oral BID BM  . fentaNYL  50 mcg Transdermal Q72H  . insulin aspart  0-15 Units Subcutaneous TID WC  . insulin aspart  0-5 Units Subcutaneous QHS  . pantoprazole  40 mg Oral BID  . sodium chloride  3 mL Intravenous Q12H   Continuous Infusions: . sodium chloride 100 mL/hr at 09/25/15 0052

## 2015-09-26 NOTE — Care Management Important Message (Signed)
Important Message  Patient Details  Name: Daryl Vega MRN: [id number] Date of Birth: May 31, 1946   Medicare Important Message Given:  [facility] notification given    Camillo Flaming 09/26/2015, [place] Message  Patient Details  Name: Daryl Vega MRN: [id number] Date of Birth: [birth year]   Medicare Important Message Given:  Yes-second notification given    Camillo Flaming 09/26/2015, 11:22 AM

## 2015-09-27 DIAGNOSIS — F4329 Adjustment disorder with other symptoms: Secondary | ICD-10-CM

## 2015-09-27 DIAGNOSIS — R443 Hallucinations, unspecified: Secondary | ICD-10-CM

## 2015-09-27 DIAGNOSIS — C3490 Malignant neoplasm of unspecified part of unspecified bronchus or lung: Secondary | ICD-10-CM

## 2015-09-27 LAB — GLUCOSE, CAPILLARY
GLUCOSE-CAPILLARY: 199 mg/dL — AB (ref 65–99)
Glucose-Capillary: 89 mg/dL (ref 65–99)
Glucose-Capillary: 188 mg/dL — ABNORMAL HIGH (ref 65–99)
Glucose-Capillary: 102 mg/dL — ABNORMAL HIGH (ref 65–99)

## 2015-09-27 NOTE — Consult Note (Signed)
[facility] Psychiatry Consult   Reason for Consult:  Hallucinations Referring Physician:  Dr.Devine Patient Identification: Daryl Vega MRN:  [id number] Principal Diagnosis: Hallucination Diagnosis:   Patient Active Problem List   Diagnosis Date Noted  . Hallucination [R44.3] 09/27/2015  . Antineoplastic chemotherapy induced anemia [D64.81, T45.1X5A] 09/26/2015  . Anemia of chronic disease [D63.8] 09/26/2015  . Dyslipidemia associated with type 2 diabetes mellitus ([place]) [E11.69, E78.5] 09/26/2015  . Type 2 diabetes, controlled, 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[place]) [E11.42] 09/26/2015  . Polypharmacy [Z79.899] 09/25/2015  . Acute encephalopathy [G93.40] 09/25/2015  . Recurrent falls [R29.6] 09/25/2015  . Pulmonary embolism ([place]) [I26.99] 01/06/2015  . Enteritis due to Clostridium difficile [A04.7] 12/19/2014  . Protein-calorie malnutrition, severe ([place]) [E43] 11/30/2014  . 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 [K82.3] 11/30/2014  . Bleeding duodenal ulcer s/p ex lap/oversew 11/30/2014 [K26.4] 11/30/2014  . Hypokalemia [E87.6] 10/25/2014  . Lung cancer ([place]) [C34.90] 07/28/2014  . 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 and BI 50% obst  [R91.8] 07/20/2014  . COPD mixed type [facility]) [J44.9] 06/28/2014    Total Time spent with patient: 1 hour  Subjective:   [name] is a 69 y.o. male patient admitted with AMS . HPI:  Daryl Vega is a 69 y.o. male seen, chart reviewed for face to face psych consultation and evaluation for hallucinations. Patient is admitted to [facility] with confusion and recurrent falls over few weeks. Patient reportedly diagnosed with lung cancer and received both chemo and radiation therapy which is able to control his cancer. He has poor insight for the purpose of this hospitalization and asking to help sending home with medication changes if needed. Patient was recently hospitalized for acute massive pulmonary  embolism has undergone TPA treatment and was discharged on Apixaban. Patient was on fentanyl patch 75 g which was reduced by patient's pain management clinic to 50 g starting tomorrow and also started cymbalta instead of lyrica. Patient stated that he is watching television late in night and sleep most of the day time. He lives with his wife and 23 years old son. 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 Patient has been talking "out of his head" as per his wife and he denied preoccupations, racing thoughts, paranoia and does not appear to be responding to internal stimuli during this visit.   The patient is coming from home, at his baseline ambulates with walker and is independent for most of his ADL; manages his medication on his own.  Past Psychiatric History: None  Risk to Self: Is patient at risk for suicide?: No Risk to Others:   Prior Inpatient Therapy:   Prior Outpatient Therapy:    Past Medical History:  Past Medical History  Diagnosis Date  . DM (diabetes mellitus) ([place])   . Pneumonia   . Hypercholesteremia   . Melanoma ([place])   . Radiation 08/03/14-08/23/14    36 gray to right chest  . Lung cancer ([place])   . FH: chemotherapy   . Gastroparesis 02/09/2015  . Cholecystoduodenal fistula   . Pulmonary embolus ([place])   . Duodenal ulcer   . COPD (chronic obstructive pulmonary disease) ([place])   . Protein calorie malnutrition ([place])   . Elevated LFTs   . C. difficile enteritis   . Anemia     Past Surgical History  Procedure Laterality Date  . Video bronchoscopy Bilateral 07/20/2014    Procedure: VIDEO BRONCHOSCOPY WITHOUT FLUORO;  Surgeon: Tanda Rockers, MD;  Location: [facility];  Service: Cardiopulmonary;  Laterality: Bilateral;  . Esophagogastroduodenoscopy N/A 11/30/2014    Procedure: ESOPHAGOGASTRODUODENOSCOPY (EGD);  Surgeon: [name], MD;  Location: [facility];  Service: Endoscopy;  Laterality: N/A;  . Laparotomy N/A  11/30/2014    Procedure: EXPLORATORY LAPAROTOMY, PYLOROPLASTY, OVERSEWING OF POSTERIOR DUODENUM, DUODENOSTOMY, CHOLECYSTECTOMY, JEJEUNOSTOMY;  Surgeon: Jackolyn Confer, MD;  Location: [facility];  Service: General;  Laterality: N/A;  . Pyloroplasty    . Cholecystectomy    . Duodenostomy tube    . Jejunostomy feeding tube     Family History:  Family History  Problem Relation Age of Onset  . Heart disease Mother   . Cancer Mother     ? type   Family Psychiatric  History: none reported. Social History:  History  Alcohol Use No     History  Drug Use No    Social History   Social History  . Marital Status: Married    Spouse Name: N/A  . Number of Children: N/A  . Years of Education: N/A   Occupational History  . Truck Geophysicist/field seismologist     Social History Main Topics  . Smoking status: Former Smoker -- 2.00 packs/day for 50 years    Types: Cigarettes, Cigars    Quit date: 06/12/2014  . Smokeless tobacco: Former Systems developer    Types: [place] date: 09/07/2012  . Alcohol Use: No  . Drug Use: No  . Sexual Activity: No   Other Topics Concern  . None   Social History Narrative   Additional Social History:      Allergies:  No Known Allergies  Labs:  Results for orders placed or performed during the hospital encounter of 09/24/15 (from the past 48 hour(s))  Glucose, capillary     Status: Abnormal   Collection Time: 09/25/15  5:07 PM  Result Value Ref Range   Glucose-Capillary 140 (H) 65 - 99 mg/dL   Comment 1 Notify RN   Glucose, capillary     Status: None   Collection Time: 09/25/15  9:34 PM  Result Value Ref Range   Glucose-Capillary 99 65 - 99 mg/dL  Glucose, capillary     Status: Abnormal   Collection Time: 09/26/15  7:40 AM  Result Value Ref Range   Glucose-Capillary 126 (H) 65 - 99 mg/dL  Basic metabolic panel     Status: Abnormal   Collection Time: 09/26/15 11:29 AM  Result Value Ref Range   Sodium 138 135 - 145 mmol/L   Potassium 3.3 (L) 3.5 - 5.1 mmol/L   Chloride  104 101 - 111 mmol/L   CO2 24 22 - 32 mmol/L   Glucose, Bld 130 (H) 65 - 99 mg/dL   BUN 9 6 - 20 mg/dL   Creatinine, Ser 0.95 0.61 - 1.24 mg/dL   Calcium 9.1 8.9 - 10.3 mg/dL   GFR calc non Af Amer >60 >60 mL/min   GFR calc Af Amer >60 >60 mL/min    Comment: (NOTE) The eGFR has been calculated using the CKD EPI equation. This calculation has not been validated in all clinical situations. eGFR's persistently <60 mL/min signify possible Chronic Kidney Disease.    Anion gap 10 5 - 15  Glucose, capillary     Status: Abnormal   Collection Time: 09/26/15 12:46 PM  Result Value Ref Range   Glucose-Capillary 162 (H) 65 - 99 mg/dL  Glucose, capillary     Status: Abnormal   Collection  Time: 09/26/15  4:13 PM  Result Value Ref Range   Glucose-Capillary 147 (H) 65 - 99 mg/dL  Glucose, capillary     Status: None   Collection Time: 09/26/15  9:44 PM  Result Value Ref Range   Glucose-Capillary 98 65 - 99 mg/dL  Glucose, capillary     Status: None   Collection Time: 09/27/15  8:02 AM  Result Value Ref Range   Glucose-Capillary 89 65 - 99 mg/dL   Comment 1 Notify RN    Comment 2 Document in Chart   Glucose, capillary     Status: Abnormal   Collection Time: 09/27/15 12:23 PM  Result Value Ref Range   Glucose-Capillary 188 (H) 65 - 99 mg/dL   Comment 1 Notify RN    Comment 2 Document in Chart     Current Facility-Administered Medications  Medication Dose Route Frequency Provider Last Rate Last Dose  . 0.9 %  sodium chloride infusion   Intravenous Continuous Robbie Lis, MD 10 mL/hr at 09/26/15 1400    . acetaminophen (TYLENOL) tablet 650 mg  650 mg Oral Q6H PRN Lavina Hamman, MD   650 mg at 09/26/15 1432   Or  . acetaminophen (TYLENOL) suppository 650 mg  650 mg Rectal Q6H PRN Lavina Hamman, MD      . apixaban (ELIQUIS) tablet 5 mg  5 mg Oral BID Leann T Poindexter, RPH   5 mg at 09/27/15 1014  . atorvastatin (LIPITOR) tablet 80 mg  80 mg Oral q1800 Robbie Lis, MD   80 mg at  09/26/15 1652  . bismuth subsalicylate (PEPTO BISMOL) 262 MG/15ML suspension 30 mL  30 mL Per Tube Q4H PRN Lavina Hamman, MD      . budesonide-formoterol (SYMBICORT) 160-4.5 MCG/ACT inhaler 2 puff  2 puff Inhalation BID Robbie Lis, MD   2 puff at 09/26/15 1100  . cefTRIAXone (ROCEPHIN) 1 g in dextrose 5 % 50 mL IVPB  1 g Intravenous Daily Robbie Lis, MD   1 g at 09/27/15 1014  . DULoxetine (CYMBALTA) DR capsule 20 mg  20 mg Oral Daily Robbie Lis, MD   20 mg at 09/27/15 1013  . feeding supplement (ENSURE ENLIVE) (ENSURE ENLIVE) liquid 237 mL  237 mL Oral BID BM Maricela Bo Ostheim, RD   237 mL at 09/27/15 1014  . fentaNYL (DURAGESIC - dosed mcg/hr) 50 mcg  50 mcg Transdermal Q72H Lavina Hamman, MD   50 mcg at 09/25/15 1057  . glimepiride (AMARYL) tablet 4 mg  4 mg Oral Q supper Robbie Lis, MD   4 mg at 09/26/15 1652  . insulin aspart (novoLOG) injection 0-15 Units  0-15 Units Subcutaneous TID WC Lavina Hamman, MD   3 Units at 09/27/15 1246  . insulin aspart (novoLOG) injection 0-5 Units  0-5 Units Subcutaneous QHS Lavina Hamman, MD   0 Units at 09/25/15 0119  . LORazepam (ATIVAN) tablet 0.5 mg  0.5 mg Oral Q12H PRN Lavina Hamman, MD   0.5 mg at 09/27/15 1320  . ondansetron (ZOFRAN) tablet 4 mg  4 mg Oral Q6H PRN Lavina Hamman, MD       Or  . ondansetron [facility]) injection 4 mg  4 mg Intravenous Q6H PRN Lavina Hamman, MD      . pantoprazole (PROTONIX) EC tablet 40 mg  40 mg Oral BID Lavina Hamman, MD   40 mg at 09/27/15 1014  . pregabalin (LYRICA)  capsule 100 mg  100 mg Oral Daily Robbie Lis, MD   100 mg at 09/27/15 1013  . sodium chloride 0.9 % injection 3 mL  3 mL Intravenous Q12H Lavina Hamman, MD   3 mL at 09/26/15 1003    Musculoskeletal: Strength & Muscle Tone: decreased Gait & Station: unable to stand Patient leans: N/A  Psychiatric Specialty Exam: ROS generalized weakness, fatigue and frequent falls at home No Fever-chills, No Headache, No changes with Vision or  hearing, reports vertigo No problems swallowing food or Liquids, No Chest pain, Cough or Shortness of Breath, No Abdominal pain, No Nausea or Vommitting, Bowel movements are regular, No Blood in stool or Urine, No dysuria, No new skin rashes or bruises, No new joints pains-aches,  No new weakness, tingling, numbness in any extremity, No recent weight gain or loss, No polyuria, polydypsia or polyphagia,  A full 10 point Review of Systems was done, except as stated above, all other Review of Systems were negative.  Blood pressure 99/65, pulse 99, temperature 98 F (36.7 C), temperature source Oral, resp. rate 18, height _0  (1.727 m), weight 70.035 kg (154 lb 6.4 oz), SpO2 96 %.Body mass index is 23.48 kg/(m^2).  General Appearance: Casual  Eye Contact::  Good  Speech:  Clear and Coherent and Slow  Volume:  Decreased  Mood:  Euthymic  Affect:  Appropriate and Congruent  Thought Process:  Coherent and Goal Directed  Orientation:  Full (Time, Place, and Person)  Thought Content:  WDL  Suicidal Thoughts:  No  Homicidal Thoughts:  No  Memory:  Immediate;   Good Recent;   Fair  Judgement:  Fair  Insight:  Fair  Psychomotor Activity:  Decreased  Concentration:  Good  Recall:  Good  Fund of Knowledge:Good  Language: Good  Akathisia:  Negative  Handed:  Right  AIMS (if indicated):     Assets:  Communication Skills Desire for Improvement Financial Resources/Insurance Housing Intimacy Leisure Time Resilience Social Support Transportation  ADL's:  Impaired  Cognition: WNL  Sleep:      Treatment Plan Summary: Daily contact with patient to assess and evaluate symptoms and progress in treatment and Medication management  Disposition: No safety concerns No additional psych medication recommended Patient does not meet criteria for psychiatric inpatient admission. Supportive therapy provided about ongoing stressors.  Appreciate psychiatric consultation and will sign  off Please contact [phone] or [phone] if needs further assistance   Viet Kemmerer,JANARDHAHA R. 09/27/2015 2:48 PM

## 2015-09-27 NOTE — Progress Notes (Addendum)
Patient ID: Daryl Vega, male   DOB: 08/31/1946, 69 y.o.   MRN: [id number] [facility] PROGRESS NOTE  Daryl Vega QIO:[id number] DOB: 04-21-1946 DOA: 09/24/2015 PCP: Daryl Ou, MD  Brief narrative:    69 year old male with past medical history of lung cancer on observation, diabetes mellitus, dyslipidemia, recent diagnosis of pulmonary embolism (underwent TPA) on anticoagulation with Eliquis, hospitalization in September of 2016 for respiratory failure with hypoxia thought to be secondary to pulmonary embolism. Patient presented to [facility] ED with confusion and recurrent falls.  On admission, patient was noted to have urinalysis with trace leukocytes. Blood work was essentially unremarkable. CT head showed no acute intracranial findings.  Anticipated discharge: Likely by 10/15 is mental status improves.  Assessment/Plan:    Principal Problem: Acute encephalopathy / Recurrent falls  - Unclear etiology. CT head on the admission did not demonstrate acute intracranial findings. - Patient has intermittent episodes of hallucinations. Psych consulted.  - Obtain MRI for further evaluation. - He is on multiple medications for pain including Cymbalta and Lyrica as well as pain medications. - We have resume Cymbalta and Lyrica. - We are also treating for urinary tract infection but by now would expected his mental status improves with treatment of UTI. - Per physical therapy evaluation, patient needs 24-hour supervision.  Active Problems: Urinary tract infection - UA showed leukocytes so pt started empirically on rocephin - Urine culture re-incubated for better growth  Pulmonary embolism ([place]) - Patient is on anticoagulation with apixaban  Antineoplastic chemotherapy induced anemia / Anemia of chronic disease - Secondary to history of lung cancer  - Drop noted from 12.8 to 10.9 - Likely from IV fluids, dilutional. - Follow CBC daily.  Hypokalemia - Likely from poor  nutritional intake - Supplemented - Check BMP today   Dyslipidemia associated with type 2 diabetes mellitus (HCC) - Continue Lipitor 80 mg daily  Type 2 diabetes, controlled, 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HCC) - A1c in 12/2014 5.7 indicating good glycemic control - Continue glimepiride   COPD mixed type (HCC) - Stable respiratory status - Continue Symbicort   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 and BI 50% obst  - On observation  Protein-calorie malnutrition, severe ([place]) - Nutrition consulted  - Continue nutritional supplementation     DVT Prophylaxis  - Continue apixaban   Code Status: DNR/DNI Family Communication:  Spoke with patient's wife at the bedside this am  Disposition Plan: Discharge likely by 10/15 if mental status improves.   IV access:  Peripheral IV  Procedures and diagnostic studies:    Ct Head Wo Contrast 09/24/2015  Normal unenhanced CT scan of the brain.   Electronically Signed   By: Lajean Manes M.D.   On: 09/24/2015 21:26    Medical Consultants:  Psychiatry   Other Consultants:  Physical therapy Nutrition  IAnti-Infectives:   Rocephin 09/25/2015 -->    Leisa Lenz, MD  [facility] Pager [phone]  Time spent in minutes: 25 minutes  If 7PM-7AM, please contact night-coverage [email or web address] Password [email or web address] 09/27/2015, 7:16 AM   LOS: 3 days    HPI/Subjective: No acute overnight events. No vomiting, no shortness of breath.   Objective: Filed Vitals:   09/26/15 0457 09/26/15 1446 09/26/15 2148 09/27/15 0431  BP: 104/73 112/69 122/74 111/88  Pulse: 87 99 98 91  Temp: 97.7 F (36.5 C) 98.2 F (36.8 C) 97.5 F (36.4 C) 98.3 F (36.8 C)  TempSrc: Oral Oral Axillary Axillary  Resp: 16 16 16  16  Height:      Weight:    70.035 kg (154 lb 6.4 oz)  SpO2: 97%  100% 95%    Intake/Output Summary (Last 24 hours) at 09/27/15 0716 Last data filed at 09/27/15 3532  Gross per 24 hour  Intake 119.67 ml  Output     875 ml  Net -755.33 ml    Exam:   General:  Pt is sleeping this am, wakes up when called his name.   Cardiovascular: RRR, (+) S1, S2  Respiratory: Bilateral air entry, no wheezing  Abdomen: Nontender, nondistended, appreciate bowel sounds  Extremities: Bilateral pulses, no lower extremity swelling  Neuro: No focal deficits.  Data Reviewed: Basic Metabolic Panel:  Recent Labs Lab 09/24/15 1826 09/25/15 0528 09/26/15 1129  NA 135 139 138  K 3.6 3.1* 3.3*  CL 98* 104 104  CO2 '23 27 24  '$ GLUCOSE 218* 173* 130*  BUN '18 14 9  '$ CREATININE 1.05 0.88 0.95  CALCIUM 9.6 9.1 9.1   Liver Function Tests:  Recent Labs Lab 09/24/15 2031 09/25/15 0528  AST 18 18  ALT 10* 10*  ALKPHOS 93 87  BILITOT 1.1 0.8  PROT 7.0 6.9  ALBUMIN 3.6 3.4*    Recent Labs Lab 09/24/15 2031  LIPASE 25    Recent Labs Lab 09/24/15 2031  AMMONIA 27   CBC:  Recent Labs Lab 09/24/15 1826 09/25/15 0528  WBC 8.2 5.4  HGB 12.8* 10.9*  HCT 38.1* 32.5*  MCV 86.4 86.9  PLT 227 191   Cardiac Enzymes:  Recent Labs Lab 09/24/15 2031  TROPONINI <0.03   BNP: Invalid input(s): POCBNP CBG:  Recent Labs Lab 09/25/15 2134 09/26/15 0740 09/26/15 1246 09/26/15 1613 09/26/15 2144  GLUCAP 99 126* 162* 147* 98    Recent Results (from the past 240 hour(s))  Urine culture     Status: None   Collection Time: 09/24/15 10:04 PM  Result Value Ref Range Status   Specimen Description URINE, CLEAN CATCH  Final   Special Requests Normal  Final   Culture   Final    MULTIPLE SPECIES PRESENT, SUGGEST RECOLLECTION Performed at [facility]    Report Status 09/26/2015 FINAL  Final     Scheduled Meds: . apixaban  5 mg Oral BID  . atorvastatin  80 mg Oral q1800  . budesonide-formoterol  2 puff Inhalation BID  . cefTRIAXone (ROCEPHIN)  IV  1 g Intravenous Daily  . DULoxetine  20 mg Oral Daily  . feeding supplement (ENSURE ENLIVE)  237 mL Oral BID BM  . fentaNYL  50 mcg  Transdermal Q72H  . glimepiride  4 mg Oral Q supper  . insulin aspart  0-15 Units Subcutaneous TID WC  . insulin aspart  0-5 Units Subcutaneous QHS  . pantoprazole  40 mg Oral BID  . pregabalin  100 mg Oral Daily  . sodium chloride  3 mL Intravenous Q12H   Continuous Infusions: . sodium chloride 10 mL/hr at 09/26/15 1400

## 2015-09-28 ENCOUNTER — Inpatient Hospital Stay (HOSPITAL_COMMUNITY): Payer: Medicare Other

## 2015-09-28 DIAGNOSIS — R918 Other nonspecific abnormal finding of lung field: Secondary | ICD-10-CM

## 2015-09-28 LAB — GLUCOSE, CAPILLARY
GLUCOSE-CAPILLARY: 114 mg/dL — AB (ref 65–99)
GLUCOSE-CAPILLARY: 189 mg/dL — AB (ref 65–99)
GLUCOSE-CAPILLARY: 148 mg/dL — AB (ref 65–99)
Glucose-Capillary: 101 mg/dL — ABNORMAL HIGH (ref 65–99)

## 2015-09-28 LAB — BASIC METABOLIC PANEL
ANION GAP: 9 (ref 5–15)
BUN: 9 mg/dL (ref 6–20)
CALCIUM: 8.9 mg/dL (ref 8.9–10.3)
CO2: 23 mmol/L (ref 22–32)
Chloride: 107 mmol/L (ref 101–111)
Creatinine, Ser: 0.8 mg/dL (ref 0.61–1.24)
GFR calc Af Amer: >60 mL/min (ref >60)
GLUCOSE: 131 mg/dL — AB (ref 65–99)
POTASSIUM: 3.3 mmol/L — AB (ref 3.5–5.1)
Sodium: 139 mmol/L (ref 135–145)

## 2015-09-28 NOTE — Progress Notes (Signed)
Psych consulted for hallucination, psychiatrist seen and signed off with no further follow up needed at this time. Please consult CSW if further needs arise. No further Clinical Social Work needs, signing off.   Belia Heman, [place] Work  [employer] [phone] covering med/psych

## 2015-09-28 NOTE — Progress Notes (Addendum)
Patient ID: Daryl Vega, male   DOB: 1946/10/24, 69 y.o.   MRN: [id number] [facility] PROGRESS NOTE  Raiquan Chandler QQI:[id number] DOB: 1946/09/21 DOA: 09/24/2015 PCP: [name], MD  Brief narrative:    69 year old male with past medical history of lung cancer on observation, diabetes mellitus, dyslipidemia, recent diagnosis of pulmonary embolism (underwent TPA) on anticoagulation with Eliquis, hospitalization in September of 2016 for respiratory failure with hypoxia thought to be secondary to pulmonary embolism. Patient presented to [facility] ED with confusion and recurrent falls.  On admission, patient was noted to have urinalysis with trace leukocytes. Blood work was essentially unremarkable. CT head showed no acute intracranial findings. MRI brain scheduled for today 09/27/2015.  Anticipated discharge: Likely by 10/15 is mental status improves.  Assessment/Plan:    Principal Problem: Acute encephalopathy / Recurrent falls / Hallucinations  - Unclear etiology.  - CT head showed no acute intracranial findings. MRI brain pending. - Psych has seen the patient in consultation with no recommendations to start any medications at this point. - We are still treating for urinary tract infection. Continue Rocephin. - Patient needs 24-hour supervision per physical therapy evaluation.  Active Problems: Urinary tract infection - UA showed leukocytes. - Patient started on empiric Rocephin. - Urine culture with multiple species none predominant.  Pulmonary embolism (HCC) - Continue anticoagulation with Eliquis.  Antineoplastic chemotherapy induced anemia / Anemia of chronic disease - Secondary to history of lung cancer  - Drop noted from 12.8 to 10.9 which is likely dilutional from received IV fluids on admission.   Hypokalemia - Likely from poor nutritional intake - Supplemented - BMP pending for today.   Dyslipidemia associated with type 2 diabetes mellitus ([place]) - We  will continue Lipitor 80 mg daily  Type 2 diabetes, controlled, 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HCC) - A1c in 12/2014 5.7 indicating good glycemic control - We will continue glimepiride   COPD mixed type (HCC) - Continue Symbicort  - Not in acute exacerbation.  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 and BI 50% obst  - Currently on observation  Protein-calorie malnutrition, severe ([place]) - Seen by dietitian. - We will continue nutritional supplementation    DVT Prophylaxis  - Continue apixaban for anticoagulation   Code Status: DNR/DNI Family Communication:  Spoke with patient's wife at the bedside 09/27/2015. Disposition Plan: Discharge likely by 10/15 if mental status improves.   IV access:  Peripheral IV  Procedures and diagnostic studies:    Ct Head Wo Contrast 09/24/2015  Normal unenhanced CT scan of the brain.   Electronically Signed   By: Lajean Manes M.D.   On: 09/24/2015 21:26    Medical Consultants:  Psychiatry   Other Consultants:  Physical therapy Nutrition  IAnti-Infectives:   Rocephin 09/25/2015 -->    Leisa Lenz, MD  [facility] Pager [phone]  Time spent in minutes: 25 minutes  If 7PM-7AM, please contact night-coverage [email or web address] Password [email or web address] 09/28/2015, 10:15 AM   LOS: 4 days    HPI/Subjective: No acute overnight events. Still confused, drowsy.  Objective: Filed Vitals:   09/27/15 0431 09/27/15 1314 09/27/15 2147 09/28/15 0456  BP: 111/88 99/65 101/67 105/79  Pulse: 91 99 97 87  Temp: 98.3 F (36.8 C) 98 F (36.7 C) 98.3 F (36.8 C) 98.3 F (36.8 C)  TempSrc: Axillary Oral Oral Oral  Resp: '16 18 20 20  '$ Height:      Weight: 70.035 kg (154 lb 6.4 oz)   70.081 kg (154  lb 8 oz)  SpO2: 95% 96% 96% 98%    Intake/Output Summary (Last 24 hours) at 09/28/15 1015 Last data filed at 09/28/15 0501  Gross per 24 hour  Intake    355 ml  Output    301 ml  Net     54 ml    Exam:   General:  Still looks  drowsy, no distress  Cardiovascular: Rate control, appreciate S1-S2  Respiratory: Clear to auscultation, no wheezing  Abdomen: Appreciate bowel sounds, nontender abdomen  Extremities: No leg edema, pulses palpable  Neuro: Nonfocal  Data Reviewed: Basic Metabolic Panel:  Recent Labs Lab 09/24/15 1826 09/25/15 0528 09/26/15 1129  NA 135 139 138  K 3.6 3.1* 3.3*  CL 98* 104 104  CO2 '23 27 24  '$ GLUCOSE 218* 173* 130*  BUN '18 14 9  '$ CREATININE 1.05 0.88 0.95  CALCIUM 9.6 9.1 9.1   Liver Function Tests:  Recent Labs Lab 09/24/15 2031 09/25/15 0528  AST 18 18  ALT 10* 10*  ALKPHOS 93 87  BILITOT 1.1 0.8  PROT 7.0 6.9  ALBUMIN 3.6 3.4*    Recent Labs Lab 09/24/15 2031  LIPASE 25    Recent Labs Lab 09/24/15 2031  AMMONIA 27   CBC:  Recent Labs Lab 09/24/15 1826 09/25/15 0528  WBC 8.2 5.4  HGB 12.8* 10.9*  HCT 38.1* 32.5*  MCV 86.4 86.9  PLT 227 191   Cardiac Enzymes:  Recent Labs Lab 09/24/15 2031  TROPONINI <0.03   BNP: Invalid input(s): POCBNP CBG:  Recent Labs Lab 09/27/15 0802 09/27/15 1223 09/27/15 1718 09/27/15 2144 09/28/15 0721  GLUCAP 89 188* 102* 199* 114*    Recent Results (from the past 240 hour(s))  Urine culture     Status: None   Collection Time: 09/24/15 10:04 PM  Result Value Ref Range Status   Specimen Description URINE, CLEAN CATCH  Final   Special Requests Normal  Final   Culture   Final    MULTIPLE SPECIES PRESENT, SUGGEST RECOLLECTION Performed at [facility]    Report Status 09/26/2015 FINAL  Final     Scheduled Meds: . apixaban  5 mg Oral BID  . atorvastatin  80 mg Oral q1800  . budesonide-formoterol  2 puff Inhalation BID  . cefTRIAXone (ROCEPHIN)  IV  1 g Intravenous Daily  . DULoxetine  20 mg Oral Daily  . feeding supplement (ENSURE ENLIVE)  237 mL Oral BID BM  . fentaNYL  50 mcg Transdermal Q72H  . glimepiride  4 mg Oral Q supper  . insulin aspart  0-15 Units Subcutaneous TID WC  .  insulin aspart  0-5 Units Subcutaneous QHS  . pantoprazole  40 mg Oral BID  . pregabalin  100 mg Oral Daily  . sodium chloride  3 mL Intravenous Q12H   Continuous Infusions: . sodium chloride 10 mL/hr at 09/26/15 1400

## 2015-09-28 NOTE — Care Management Note (Signed)
Case Management Note  Patient Details  Name: Daryl Vega MRN: [id number] Date of Birth: 14-Oct-1946  Subjective/Objective: Hallucination                   Action/Plan: SNF   Expected Discharge Date:                  Expected Discharge Plan:  Skilled Nursing Facility  In-House Referral:  Clinical Social Work  Discharge planning Services     Post Acute Care Choice:    Choice offered to:     DME Arranged:    DME Agency:     HH Arranged:    [place] Agency:     Status of Service:  Completed, signed off  Medicare Important Message Given:  Yes-second notification given Date Medicare IM Given:    Medicare IM give by:    Date Additional Medicare IM Given:    Additional Medicare Important Message give by:     If discussed at [place] of Stay Meetings, dates discussed:    Additional CommentsPurcell Mouton, RN 09/28/2015, 4:41 PM

## 2015-09-28 NOTE — Progress Notes (Signed)
ANTICOAGULATION CONSULT NOTE - Follow Up  Pharmacy Consult for Eliquis Indication: pulmonary embolus  No Known Allergies  Patient Measurements: Height: '5\' 8"'$  (172.7 cm) Weight: 154 lb 8 oz (70.081 kg) IBW/kg (Calculated) : 68.4  Vital Signs: Temp: 98.3 F (36.8 C) (10/14 0456) Temp Source: Oral (10/14 0456) BP: 105/79 mmHg (10/14 0456) Pulse Rate: 87 (10/14 0456)  Labs:  Recent Labs  09/26/15 1129  CREATININE 0.95    Estimated Creatinine Clearance: 72 mL/min (by C-G formula based on Cr of 0.95).   Medical History: Past Medical History  Diagnosis Date  . DM (diabetes mellitus) ([place])   . Pneumonia   . Hypercholesteremia   . Melanoma ([place])   . Radiation 08/03/14-08/23/14    35 gray to right chest  . Lung cancer ([place])   . FH: chemotherapy   . Gastroparesis 02/09/2015  . Cholecystoduodenal fistula   . Pulmonary embolus ([place])   . Duodenal ulcer   . COPD (chronic obstructive pulmonary disease) ([place])   . Protein calorie malnutrition ([place])   . Elevated LFTs   . C. difficile enteritis   . Anemia      Assessment: 69 yr male presents with reports of confusion, recurrent falls and blood in the urine.  PMH incluces lung cancer and recent diagnosis of pulmonary embolism, which was treated with TPA and then discharged on Eliquis.  PTA currently on Eliquis '5mg'$  BID since 10/3 (last reported dose was 10/10 @ 11am).  Upon admission, pharmacy consulted to dose Eliquis for continued treatment of PE.  Pharmacy resume apixaban 5 mg BID.  Goal of Therapy:  Full anticoagulation coverage Monitor platelets by anticoagulation protocol: Yes   Plan:   Continue Eliquis '5mg'$  BID.  Pharmacy will continue to monitor but will not leave further notes unless adjustments are necessary.  [name], PharmD 09/28/2015,9:42 AM

## 2015-09-28 NOTE — Progress Notes (Signed)
Physical Therapy Treatment Patient Details Name: Elyon Zoll MRN: [id number] DOB: 24-Jan-1946 Today's Date: 09/28/2015    History of Present Illness 69 year old male with past medical history of lung cancer on observation, diabetes mellitus, dyslipidemia, recent diagnosis of pulmonary embolism (underwent TPA) on anticoagulation with Eliquis, hospitalization in September of 2016 for respiratory failure with hypoxia thought to be secondary to pulmonary embolism.  Pt admitted for acute encephalopathy likely due to polypharmacy.    PT Comments    Pt presents with better cognition today although does not remember ambulating with PT a couple days ago.  Pt ambulated around unit 3 times with RW.  Pt encouraged to ambulate with staff during acute stay.  Will check back possibly once more and if pt still ambulating well, likely sign off.   Follow Up Recommendations  Supervision for mobility/OOB;No PT follow up     Equipment Recommendations  None recommended by PT    Recommendations for Other Services       Precautions / Restrictions Precautions Precautions: Fall    Mobility  Bed Mobility Overal bed mobility: Modified Independent                Transfers Overall transfer level: Needs assistance Equipment used: Rolling walker (2 wheeled) Transfers: Sit to/from Stand Sit to Stand: Min assist         General transfer comment: verbal cues for safe technique, assist to rise and steady as pt had R forefoot contact only, was keeping heel off floor, cues for correction  Ambulation/Gait Ambulation/Gait assistance: Min guard Ambulation Distance (Feet): 400 Feet (x3) Assistive device: Rolling walker (2 wheeled) Gait Pattern/deviations: Step-through pattern     General Gait Details: verbal cues for RW positioning, more steady today with RW, able to tolerate 3 laps around unit   Stairs            Wheelchair Mobility    Modified Rankin (Stroke Patients Only)        Balance                                    Cognition Arousal/Alertness: Awake/alert Behavior During Therapy: WFL for tasks assessed/performed Overall Cognitive Status: Within Functional Limits for tasks assessed                      Exercises      General Comments        Pertinent Vitals/Pain Pain Assessment: No/denies pain    Home Living                      Prior Function            PT Goals (current goals can now be found in the care plan section) Progress towards PT goals: Progressing toward goals    Frequency  Min 3X/week    PT Plan Current plan remains appropriate    Co-evaluation             End of Session Equipment Utilized During Treatment: Gait belt Activity Tolerance: Patient tolerated treatment well Patient left: in bed;with call bell/phone within reach;with nursing/sitter in room     Time: 2440-1027 PT Time Calculation (min) (ACUTE ONLY): 18 min  Charges:  $Gait Training: 8-22 mins                    G Codes:      Adisen Bennion,KATHrine E  09/28/2015, 4:23 PM Carmelia Bake, PT, DPT 09/28/2015 Pager: [phone]

## 2015-09-29 LAB — GLUCOSE, CAPILLARY
GLUCOSE-CAPILLARY: 122 mg/dL — AB (ref 65–99)
GLUCOSE-CAPILLARY: 125 mg/dL — AB (ref 65–99)
Glucose-Capillary: 105 mg/dL — ABNORMAL HIGH (ref 65–99)
Glucose-Capillary: 127 mg/dL — ABNORMAL HIGH (ref 65–99)

## 2015-09-29 NOTE — Progress Notes (Addendum)
Patient ID: Daryl Vega, male   DOB: June 02, 1946, 69 y.o.   MRN: [id number] [facility] PROGRESS NOTE  Demetria Lightsey [id number] DOB: 04/24/1946 DOA: 09/24/2015 PCP: [name], MD  Brief narrative:    70 year old male with past medical history of lung cancer on observation, diabetes mellitus, dyslipidemia, recent diagnosis of pulmonary embolism (underwent TPA) on anticoagulation with Eliquis, hospitalization in September of 2016 for respiratory failure with hypoxia thought to be secondary to pulmonary embolism. Patient presented to [facility] ED with confusion and recurrent falls.  On admission, patient was noted to have urinalysis with trace leukocytes. Blood work was essentially unremarkable. CT head showed no acute intracranial findings. MRI brain did not demonstrate acute findings.   Anticipated discharge: Likely by 10/17 provided mental status better.   Assessment/Plan:    Principal Problem: Acute encephalopathy / Recurrent falls / Hallucinations  - Unclear etiology. CT head and MRI brain without acute findings. - Psych hass seen him in consultation, with no specific recommendations, pysch therapy not recommended - PT recommended 24 hour supervision - Family prefers [place] on discharge - Will continue IV rocephin for another 48 hours which would be total of 7 days of abx.   Active Problems: Urinary tract infection - Started on empiric Rocephin. - Urine culture with multiple species none predominant.  Pulmonary embolism (HCC) - Continue Eliquis.  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 - We will continue nutritional supplementation    DVT Prophylaxis  - Continue apixaban   Code Status: DNR/DNI Family Communication:  Spoke with patient's wife at the bedside 09/27/2015. Disposition Plan: Discharge likely by 10/17  IV access:  Peripheral IV  Procedures and diagnostic studies:    Ct Head Wo Contrast 09/24/2015  Normal unenhanced CT scan of the brain.   Electronically Signed   By: Lajean Manes M.D.   On: 09/24/2015 21:26    Medical Consultants:  Psychiatry   Other Consultants:  Physical therapy Nutrition  IAnti-Infectives:   Rocephin 09/25/2015 -->    Leisa Lenz, MD  [facility] Pager [phone]  Time spent in minutes: 15 minutes  If 7PM-7AM, please contact night-coverage [email or web address] Password [email or web address] 09/29/2015, 11:40 AM   LOS: 5 days    HPI/Subjective: No acute overnight events. Still confused, drowsy.  Objective: Filed Vitals:   09/28/15 1430 09/28/15 2034 09/29/15 0511 09/29/15 0900  BP: 124/94 102/70 102/68 109/63  Pulse: 111 83 73 72  Temp: 97.4 F (36.3 C) 98.6 F (37 C) 97.9 F (36.6 C) 98.6 F (37 C)  TempSrc: Oral Oral Oral Oral  Resp: '20 18 18 18  '$ Height:      Weight:   71.124 kg (156 lb 12.8 oz)   SpO2: 97% 93% 97% 96%    Intake/Output Summary (Last 24 hours) at 09/29/15 1140 Last data filed at 09/29/15 0900  Gross per 24 hour  Intake  360 ml  Output    300 ml  Net     60 ml    Exam:   General:  Still looks drowsy, no distress  Cardiovascular: Rate control, appreciate S1-S2  Respiratory: Clear to auscultation, no wheezing  Abdomen: Appreciate bowel sounds, nontender abdomen  Extremities: No leg edema, pulses palpable  Neuro: Nonfocal  Data  Reviewed: Basic Metabolic Panel:  Recent Labs Lab 09/24/15 1826 09/25/15 0528 09/26/15 1129 09/28/15 1045  NA 135 139 138 139  K 3.6 3.1* 3.3* 3.3*  CL 98* 104 104 107  CO2 '23 27 24 23  '$ GLUCOSE 218* 173* 130* 131*  BUN '18 14 9 9  '$ CREATININE 1.05 0.88 0.95 0.80  CALCIUM 9.6 9.1 9.1 8.9   Liver Function Tests:  Recent Labs Lab 09/24/15 2031 09/25/15 0528  AST 18 18  ALT 10* 10*  ALKPHOS 93 87  BILITOT 1.1 0.8  PROT 7.0 6.9  ALBUMIN 3.6 3.4*    Recent Labs Lab 09/24/15 2031  LIPASE 25    Recent Labs Lab 09/24/15 2031  AMMONIA 27   CBC:  Recent Labs Lab 09/24/15 1826 09/25/15 0528  WBC 8.2 5.4  HGB 12.8* 10.9*  HCT 38.1* 32.5*  MCV 86.4 86.9  PLT 227 191   Cardiac Enzymes:  Recent Labs Lab 09/24/15 2031  TROPONINI <0.03   BNP: Invalid input(s): POCBNP CBG:  Recent Labs Lab 09/28/15 0721 09/28/15 1252 09/28/15 1655 09/28/15 2032 09/29/15 0750  GLUCAP 114* 101* 189* 148* 122*    Recent Results (from the past 240 hour(s))  Urine culture     Status: None   Collection Time: 09/24/15 10:04 PM  Result Value Ref Range Status   Specimen Description URINE, CLEAN CATCH  Final   Special Requests Normal  Final   Culture   Final    MULTIPLE SPECIES PRESENT, SUGGEST RECOLLECTION Performed at [facility]    Report Status 09/26/2015 FINAL  Final     Scheduled Meds: . apixaban  5 mg Oral BID  . atorvastatin  80 mg Oral q1800  . budesonide-formoterol  2 puff Inhalation BID  . cefTRIAXone (ROCEPHIN)  IV  1 g Intravenous Daily  . DULoxetine  20 mg Oral Daily  . feeding supplement (ENSURE ENLIVE)  237 mL Oral BID BM  . fentaNYL  50 mcg Transdermal Q72H  . glimepiride  4 mg Oral Q supper  . insulin aspart  0-15 Units Subcutaneous TID WC  . insulin aspart  0-5 Units Subcutaneous QHS  . pantoprazole  40 mg Oral BID  . pregabalin  100 mg Oral Daily  . sodium chloride  3 mL Intravenous Q12H   Continuous Infusions: . sodium chloride 10  mL/hr at 09/26/15 1400

## 2015-09-30 LAB — BASIC METABOLIC PANEL
Anion gap: 6 (ref 5–15)
BUN: 15 mg/dL (ref 6–20)
CALCIUM: 8.7 mg/dL — AB (ref 8.9–10.3)
CO2: 24 mmol/L (ref 22–32)
CREATININE: 0.78 mg/dL (ref 0.61–1.24)
Chloride: 107 mmol/L (ref 101–111)
Glucose, Bld: 105 mg/dL — ABNORMAL HIGH (ref 65–99)
Potassium: 3.6 mmol/L (ref 3.5–5.1)
SODIUM: 137 mmol/L (ref 135–145)

## 2015-09-30 LAB — GLUCOSE, CAPILLARY
GLUCOSE-CAPILLARY: 155 mg/dL — AB (ref 65–99)
GLUCOSE-CAPILLARY: 127 mg/dL — AB (ref 65–99)
GLUCOSE-CAPILLARY: 92 mg/dL (ref 65–99)
Glucose-Capillary: 148 mg/dL — ABNORMAL HIGH (ref 65–99)

## 2015-09-30 NOTE — Progress Notes (Signed)
Patient ID: Daryl Vega, male   DOB: 04-Jun-1946, 68 y.o.   MRN: [id number] [facility] PROGRESS NOTE  Daryl Vega [id number] DOB: [birth year] DOA: 09/24/2015 PCP: [name], MD  Brief narrative:    69 year old male with past medical history of lung cancer on observation, diabetes mellitus, dyslipidemia, recent diagnosis of pulmonary embolism (underwent TPA) on anticoagulation with Eliquis, hospitalization in September of 2016 for respiratory failure with hypoxia thought to be secondary to pulmonary embolism. Patient presented to [facility] ED with confusion and recurrent falls.  On admission, patient was noted to have urinalysis with trace leukocytes. Blood work was essentially unremarkable. CT head showed no acute intracranial findings. MRI brain did not demonstrate acute findings.   Anticipated discharge: Likely by 10/17, home with HH. Pt sife reproted she prefers pt goes home rather than rehab.  Assessment/Plan:    Principal Problem: Acute encephalopathy / Recurrent falls / Hallucinations  - Unclear etiology. His mental status has not improved as much. He is alert, awake but still confused.  - No acute findings on CT head or MRI brain. - Psych has seen the pt but did not think patient has psychiatric illness and therefore no recommendations for treatment.  - PT recommended 24 hour supervision - Family prefers [place] on discharge - Continue rocephin through 10/17.  Active Problems: Urinary tract infection - Urine culture with multiple species none predominant. - Continue Rocephin through 10/17.  Pulmonary embolism (HCC) - Continue Eliquis.  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 - Continue nutritional supplementation    DVT Prophylaxis  - Continue apixaban on discharge   Code Status: DNR/DNI Family Communication:  Spoke with patient's wife at the bedside Disposition Plan: 10/17.  IV access:  Peripheral IV  Procedures and diagnostic studies:    Ct Head Wo Contrast 09/24/2015  Normal unenhanced CT scan of the brain.     Medical Consultants:  Psychiatry   Other Consultants:  Physical therapy Nutrition  IAnti-Infectives:   Rocephin 09/25/2015 -->    Daryl Lenz, MD  [facility] Pager [phone]  Time spent in minutes: 15 minutes  If 7PM-7AM, please contact night-coverage [email or web address] Password [email or web address] 09/30/2015, 8:56 AM   LOS: 6 days    HPI/Subjective: No acute overnight events. No agitation.   Objective: Filed Vitals:   09/29/15 0900 09/29/15 1506 09/29/15 2116 09/30/15 0423  BP: 109/63 98/64 103/62 97/63  Pulse: 72 75 82 76  Temp: 98.6 F (37 C) 97.9 F (36.6 C) 98.4 F (36.9 C) 98.1 F (36.7 C)  TempSrc: Oral Oral Oral Oral  Resp: '18 18 16 16  '$ Height:      Weight:    72.349 kg (159 lb 8 oz)  SpO2: 96% 95% 95% 95%    Intake/Output Summary (Last 24 hours) at 09/30/15 0856 Last data filed at 09/29/15 0900  Gross per 24 hour  Intake  120 ml  Output      0 ml  Net    120 ml    Exam:   General:  Sleeping but wakes up easily when called his name, no distress   Cardiovascular: RRR, (+) S1, S2   Respiratory: bilateral air entry, no wheezing   Abdomen: non tender, non distended, (+) BS   Extremities: no swelling, palpable pulses bilaterally   Neuro: No focal  deficits   Data Reviewed: Basic Metabolic Panel:  Recent Labs Lab 09/24/15 1826 09/25/15 0528 09/26/15 1129 09/28/15 1045  NA 135 139 138 139  K 3.6 3.1* 3.3* 3.3*  CL 98* 104 104 107  CO2 '23 27 24 23  '$ GLUCOSE 218* 173* 130* 131*  BUN '18 14 9 9  '$ CREATININE 1.05 0.88 0.95 0.80  CALCIUM 9.6 9.1 9.1 8.9   Liver Function Tests:  Recent Labs Lab 09/24/15 2031 09/25/15 0528  AST 18 18  ALT 10* 10*  ALKPHOS 93 87  BILITOT 1.1 0.8  PROT 7.0 6.9  ALBUMIN 3.6 3.4*    Recent Labs Lab 09/24/15 2031  LIPASE 25    Recent Labs Lab 09/24/15 2031  AMMONIA 27   CBC:  Recent Labs Lab 09/24/15 1826 09/25/15 0528  WBC 8.2 5.4  HGB 12.8* 10.9*  HCT 38.1* 32.5*  MCV 86.4 86.9  PLT 227 191   Cardiac Enzymes:  Recent Labs Lab 09/24/15 2031  TROPONINI <0.03   BNP: Invalid input(s): POCBNP CBG:  Recent Labs Lab 09/29/15 0750 09/29/15 1137 09/29/15 1655 09/29/15 2115 09/30/15 0727  GLUCAP 122* 105* 127* 125* 155*    Recent Results (from the past 240 hour(s))  Urine culture     Status: None   Collection Time: 09/24/15 10:04 PM  Result Value Ref Range Status   Specimen Description URINE, CLEAN CATCH  Final   Special Requests Normal  Final   Culture   Final    MULTIPLE SPECIES PRESENT, SUGGEST RECOLLECTION Performed at [facility]    Report Status 09/26/2015 FINAL  Final     Scheduled Meds: . apixaban  5 mg Oral BID  . atorvastatin  80 mg Oral q1800  . budesonide-formoterol  2 puff Inhalation BID  . cefTRIAXone (ROCEPHIN)  IV  1 g Intravenous Daily  . DULoxetine  20 mg Oral Daily  . feeding supplement (ENSURE ENLIVE)  237 mL Oral BID BM  . fentaNYL  50 mcg Transdermal Q72H  . glimepiride  4 mg Oral Q supper  . insulin aspart  0-15 Units Subcutaneous TID WC  . insulin aspart  0-5 Units Subcutaneous QHS  . pantoprazole  40 mg Oral BID  . pregabalin  100 mg Oral Daily  . sodium chloride  3 mL Intravenous Q12H   Continuous Infusions: .  sodium chloride 10 mL/hr at 09/26/15 1400

## 2015-10-01 ENCOUNTER — Encounter: Payer: Self-pay | Admitting: Gastroenterology

## 2015-10-01 LAB — GLUCOSE, CAPILLARY: GLUCOSE-CAPILLARY: 126 mg/dL — AB (ref 65–99)

## 2015-10-01 MED ORDER — ACETAMINOPHEN 325 MG PO TABS: 650.0000 mg | ORAL_TABLET | Freq: Four times a day (QID) | ORAL | Status: DC | PRN

## 2015-10-01 MED ORDER — ENSURE ENLIVE PO LIQD: 237.0000 mL | Freq: Two times a day (BID) | ORAL | Status: DC

## 2015-10-01 NOTE — Progress Notes (Signed)
Pt discharge home with spouse and to follow up with PCP. Pt in agreement with plan. Awaiting spouse to pick him up.

## 2015-10-01 NOTE — Discharge Summary (Signed)
Physician Discharge Summary  Mitchell Iwanicki IRS:[id number] DOB: 1946/04/22 DOA: 09/24/2015  PCP: [name], MD  Admit date: 09/24/2015 Discharge date: 10/01/2015  Recommendations for Outpatient Follow-up:  1. Continue current medications as per prior to this admission 2. Follow up with PCP in 1 week per scheduled appointment.   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 Patient presented to [facility] ED with confusion and recurrent falls.  On admission, patient was noted to have urinalysis with trace leukocytes. Blood work was essentially unremarkable. CT head showed no acute intracranial findings. MRI brain did not demonstrate acute findings.   Hospital Course:   Assessment/Plan:    Principal Problem: Acute encephalopathy / Recurrent falls / Hallucinations  - Unclear etiology.  - Mental status much better this am, pt oriented to time, place and person. RN reports the same and i spoke with pt's wife who also said pt much more oriented yesterday and today. - No acute  findings on CT head or MRI brain. - Psych has seen the pt but did not think patient has psychiatric illness and therefore no recommendations for treatment.  - PT recommended 24 hour supervision but wife wants him to go home. HH orders placed.  - D/C Rocephin today.   Active Problems: Urinary tract infection - Urine culture with multiple species none predominant. - Has received Rocephin for 7 days. No need for abx on discahrge  Pulmonary embolism ([place]) - Continue Eliquis on discharge.   Antineoplastic chemotherapy induced anemia / Anemia of chronic disease - Secondary to history of lung cancer  - Stable hemoglobin   Hypokalemia - Likely from poor nutritional intake - Supplemented  Dyslipidemia associated with type 2 diabetes mellitus (HCC) - Continue Lipitor 80 mg daily on discharge   Type 2 diabetes, controlled, 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HCC) - A1c in 12/2014 5.7 indicating good glycemic control - Continue glimepiride and metformin on discharge   COPD mixed type (HCC) - Continue Symbicort  - Pt not in acute exacerbation   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 and BI 50% obst  - On observation  Protein-calorie malnutrition, severe ([place]) - Seen by dietitian. - Continue nutritional supplementation   DVT Prophylaxis  - Continue apixaban   Code Status: DNR/DNI Family Communication: Spoke with patient's wife today, she agrees with discharge today.    IV access:  Peripheral IV  Procedures and diagnostic studies:   Ct Head Wo Contrast 09/24/2015 Normal unenhanced CT scan of the brain.   Medical Consultants:  Psychiatry   Other Consultants:  Physical therapy Nutrition  IAnti-Infectives:   Rocephin 09/25/2015 -->  10/01/2015   Signed:  Leisa Lenz, MD  [facility] 10/01/2015, 9:40 AM  Pager #: [phone]  Time spent in minutes: more than 30 minutes   Discharge Exam: Filed Vitals:   10/01/15 0500   BP: 101/58  Pulse: 80  Temp: 98.5 F (36.9 C)  Resp: 20   Filed Vitals:   09/30/15 0423 09/30/15 1331 09/30/15 2113 10/01/15 0500  BP: '97/63 92/62 94/64 '$ 101/58  Pulse: 76 84 82 80  Temp: 98.1 F (36.7 C) 98.2 F (36.8 C) 98.4 F (36.9 C) 98.5 F (36.9 C)  TempSrc: Oral Oral Oral Oral  Resp: '16 16 20 20  '$ Height:      Weight: 72.349 kg (159 lb 8 oz)   72.213 kg (159 lb 3.2 oz)  SpO2: 95% 95% 95% 96%    General: Pt is more alert this am, not in acute distress Cardiovascular: Regular rate and rhythm, S1/S2 +, no murmurs Respiratory: Clear to auscultation bilaterally, no wheezing, no crackles, no rhonchi Abdominal: Soft, non tender, non distended, bowel sounds +, no guarding Extremities: no edema, no cyanosis, pulses palpable bilaterally DP and PT Neuro: Grossly nonfocal  Discharge Instructions  Discharge Instructions    Call MD for:  difficulty breathing, headache or visual disturbances    Complete by:  As directed      Call MD for:  persistant dizziness or light-headedness    Complete by:  As directed      Call MD for:  persistant nausea and vomiting    Complete by:  As directed      Call MD for:  severe uncontrolled pain    Complete by:  As directed      Diet - low sodium heart healthy    Complete by:  As directed      Increase activity slowly    Complete by:  As directed             Medication List    STOP taking these medications        bisacodyl 10 MG suppository  Commonly known as:  DULCOLAX      TAKE these medications        acetaminophen 325 MG tablet  Commonly known as:  TYLENOL  Take 2 tablets (650 mg total) by mouth every 6 (six) hours as needed for mild pain (or Fever >/= 101).     apixaban 5 MG Tabs tablet  Commonly known as:  ELIQUIS  Take 1 tablet (5 mg total) by mouth 2 (two) times daily.     atorvastatin 80 MG tablet  Commonly known as:  LIPITOR  Take 80 mg by mouth at bedtime.     bismuth subsalicylate 932 TF/57DU suspension   Commonly known as:  PEPTO BISMOL  Place 30 mLs into feeding tube every 4 (four) hours as needed for indigestion.     budesonide-formoterol 160-4.5 MCG/ACT inhaler  Commonly known as:  SYMBICORT  Take 2 puffs first thing in am and then another 2 puffs about 12 hours later.     calcium citrate-vitamin D 315-200 MG-UNIT tablet  Commonly known as:  CITRACAL+D  Take 1 tablet by mouth at bedtime.     DULoxetine 20 MG capsule  Commonly known as:  CYMBALTA  Take 1 capsule (20 mg total) by mouth daily.     feeding supplement (ENSURE ENLIVE) Liqd  Take 237 mLs by mouth 2 (two) times daily between meals.     fentaNYL 50 MCG/HR  Commonly known as:  DURAGESIC -  dosed mcg/hr  Place 50 mcg onto the skin every 3 (three) days.     glimepiride 4 MG tablet  Commonly known as:  AMARYL  Take 4 mg by mouth at bedtime. Take before meals     HYDROmorphone 4 MG tablet  Commonly known as:  DILAUDID  Take 1 tablet (4 mg total) by mouth every 8 (eight) hours as needed for severe pain.     LORazepam 0.5 MG tablet  Commonly known as:  ATIVAN  Take 1 table by mouth every 12 hours as needed for anxiety     metFORMIN 500 MG tablet  Commonly known as:  GLUCOPHAGE  Take 500 mg by mouth 2 (two) times daily with a meal.     multivitamins with iron Tabs tablet  Take 1 tablet by mouth daily after supper.     pantoprazole 40 MG tablet  Commonly known as:  PROTONIX  Take 1 tablet (40 mg total) by mouth 2 (two) times daily.     potassium chloride SA 20 MEQ tablet  Commonly known as:  K-DUR,KLOR-CON  Take 2 tablets (40 mEq total) by mouth daily.     pregabalin 100 MG capsule  Commonly known as:  LYRICA  Take 100 mg by mouth 2 (two) times daily.     prochlorperazine 10 MG tablet  Commonly known as:  COMPAZINE  Take 1 tablet (10 mg total) by mouth every 6 (six) hours as needed for nausea or vomiting.     temazepam 15 MG capsule  Commonly known as:  RESTORIL  Take 15 mg by mouth at bedtime as needed  for sleep.           Follow-up Information    Follow up with [name], MD. Schedule an appointment as soon as possible for a visit in 1 week.   Specialty:  Family Medicine   Why:  Follow up appt after recent hospitalization   Contact information:   [address] [place] [address] [phone]        The results of significant diagnostics from this hospitalization (including imaging, microbiology, ancillary and laboratory) are listed below for reference.    Significant Diagnostic Studies: Ct Head Wo Contrast  09/24/2015  CLINICAL DATA:  Falls.  Imbalance. Per wife-has been seeing blood in his urine since Saturday-on blood thinners for previous blood clot, wfe reports increased imbalance, multiple falls EXAM: CT HEAD WITHOUT CONTRAST TECHNIQUE: Contiguous axial images were obtained from the base of the skull through the vertex without intravenous contrast. COMPARISON:  09/09/2015 FINDINGS: Ventricles are normal in size and configuration. There are no parenchymal masses or mass effect. There is no evidence of an infarct. There are no extra-axial masses or abnormal fluid collections. There is no intracranial hemorrhage. Visualized sinuses are essentially clear. Clear mastoid air cells. No skull lesion. IMPRESSION: Normal unenhanced CT scan of the brain. Electronically Signed   By: Lajean Manes M.D.   On: 09/24/2015 21:26   Ct Head Wo Contrast  09/09/2015  CLINICAL DATA:  Acute pulmonary embolus with near syncopal episode. History of small cell lung cancer. Pre tPA workup. EXAM: CT HEAD WITHOUT CONTRAST TECHNIQUE: Contiguous axial images were obtained from the base of the skull through the vertex without intravenous contrast. COMPARISON:  01/06/2015 FINDINGS: Ventricles and sulci appear symmetrical. No mass effect or midline shift. No abnormal extra-axial fluid collections. Gray-white matter junctions are distinct. Basal cisterns are not effaced. No  evidence of acute intracranial hemorrhage. No  depressed skull fractures. Visualized paranasal sinuses and mastoid air cells are not opacified. IMPRESSION: No acute intracranial abnormalities. Electronically Signed   By: Lucienne Capers M.D.   On: 09/09/2015 01:41   Ct Angio Chest Pe W/cm &/or Wo Cm  09/08/2015  CLINICAL DATA:  Patient fell short of breath and and fell down. Decreased oxygen saturation. History of lung cancer. EXAM: CT ANGIOGRAPHY CHEST WITH CONTRAST TECHNIQUE: Multidetector CT imaging of the chest was performed using the standard protocol during bolus administration of intravenous contrast. Multiplanar CT image reconstructions and MIPs were obtained to evaluate the vascular anatomy. CONTRAST:  130m OMNIPAQUE IOHEXOL 350 MG/ML SOLN COMPARISON:  01/06/2015 FINDINGS: Technically adequate study with good opacification of the central and segmental pulmonary arteries. Filling defects are demonstrated in the left main pulmonary artery extending into left upper and lower lobe branches. This is consistent with acute pulmonary embolus. Subsegmental emboli are suggested in the right upper lung as well. Visualize left main pulmonary emboli are new since previous study. The RV to LV ratio is increased at 2, consistent with sub massive pulmonary embolus with right heart strain. Cardiac enlargement. Small pericardial effusion. Normal caliber thoracic aorta with calcification. Great vessel origins are patent. Esophagus is decompressed. No significant lymphadenopathy in the chest. Small right pleural effusion and minimal left pleural effusion, decreased since previous study. Atelectasis in the lung bases. There is consolidation and scarring in the right lung which may be related to postradiation changes in a patient with lung cancer. No definite residual recurrent mass lesion. Emphysematous changes in the left lung. No pneumothorax. Included portions of the upper abdominal organs demonstrate a right adrenal  gland nodule measuring 1.8 cm diameter. Hounsfield unit measurements are consistent with a fat containing adenoma. Review of the MIP images confirms the above findings. IMPRESSION: Positive for pulmonary emboli demonstrated in the left main and multiple segmental branches of the left pulmonary artery is well a subsegmental branches on the right. Elevated RV to LV ratio consistent with right heart strain. Small pleural effusions, decreasing since previous study. Atelectasis in the lung bases. Small pericardial effusion. Postradiation changes in the right upper lung. These results were called by telephone at the time of interpretation on 09/08/2015 at 11:16 pm to Dr. NDavonna Belling, who verbally acknowledged these results. Electronically Signed   By: WLucienne CapersM.D.   On: 09/08/2015 23:18   Mr Brain Wo Contrast  09/28/2015  CLINICAL DATA:  Acute encephalopathy EXAM: MRI HEAD WITHOUT CONTRAST TECHNIQUE: Multiplanar, multiecho pulse sequences of the brain and surrounding structures were obtained without intravenous contrast. COMPARISON:  08/01/2014 FINDINGS: Calvarium and upper cervical spine: No indication of osseous metastasis. Orbits: No significant findings. Sinuses and Mastoids: Minimal fluid in the right mastoid tip, chronic. Right maxillary sinus atelectasis without active inflammation. Brain: No acute abnormality such as acute infarct, hemorrhage, hydrocephalus, or mass lesion. No evidence of large vessel occlusion. No acute demyelinating process or brain edema. Normal cerebral volume and white matter for age. Detection of metastasis is limited without intravenous contrast, but there are no signs of intracranial metastatic spread. IMPRESSION: No acute finding or change since 2015. Electronically Signed   By: JMonte FantasiaM.D.   On: 09/28/2015 12:18   Dg Chest Portable 1 View  09/08/2015  CLINICAL DATA:  Shortness of breath. [place] the [place]. Ex-smoker appear EXAM: PORTABLE CHEST 1 VIEW  COMPARISON:  02/07/2015. FINDINGS: Normal sized heart. Interval linear density in the left lower lung zone. Stable right upper lobe scarring  and linear scarring at the right medial lung base. Lower thoracic spine and left shoulder degenerative changes. IMPRESSION: 1. Interval small amount of linear atelectasis or scarring in the left lower lung zone. 2. Stable right lung scarring. Electronically Signed   By: Claudie Revering M.D.   On: 09/08/2015 20:55   Dg Abd Portable 1v  09/10/2015  CLINICAL DATA:  Nausea and vomiting today, constipation since December due to medications, diabetes mellitus, lung cancer, melanoma, COPD, former smoker EXAM: PORTABLE ABDOMEN - 1 VIEW COMPARISON:  Portable exam at 1059 hours compared to 02/21/2015 FINDINGS: Scattered stool throughout colon to rectum. Air-filled nondistended small bowel loops. No bowel dilatation, bowel wall thickening or free intraperitoneal air. Chronic opacity RIGHT upper lobe likely related to prior lung cancer in treatment unchanged. Surgical clips RIGHT upper quadrant likely due to cholecystectomy. Osseous structures unremarkable. IMPRESSION: Nonobstructive bowel gas pattern. Electronically Signed   By: Lavonia Dana M.D.   On: 09/10/2015 11:50   Dg Hand Complete Left  09/07/2015  CLINICAL DATA:  Worsening bilateral hand pain EXAM: LEFT HAND - COMPLETE 3+ VIEW COMPARISON:  None. FINDINGS: No fracture or dislocation is seen. Mild degenerative changes at the 2nd and 3rd DIP joints. No marginal erosions. Visualized soft tissues are within normal limits. IMPRESSION: No acute osseus abnormality is seen. Mild degenerative changes.  No marginal erosions. Electronically Signed   By: Julian Hy M.D.   On: 09/07/2015 14:36   Dg Hand Complete Right  09/07/2015  CLINICAL DATA:  Worsening bilateral hand pain for weeks. No known injury. EXAM: RIGHT HAND - COMPLETE 3+ VIEW COMPARISON:  None. FINDINGS: No acute bony abnormality. Specifically, no fracture, subluxation,  or dislocation. Soft tissues are intact. Joint spaces are maintained. IMPRESSION: No acute bony abnormality. Electronically Signed   By: Rolm Baptise M.D.   On: 09/07/2015 14:35    Microbiology: Recent Results (from the past 240 hour(s))  Urine culture     Status: None   Collection Time: 09/24/15 10:04 PM  Result Value Ref Range Status   Specimen Description URINE, CLEAN CATCH  Final   Special Requests Normal  Final   Culture   Final    MULTIPLE SPECIES PRESENT, SUGGEST RECOLLECTION Performed at [facility]    Report Status 09/26/2015 FINAL  Final     Labs: Basic Metabolic Panel:  Recent Labs Lab 09/24/15 1826 09/25/15 0528 09/26/15 1129 09/28/15 1045 09/30/15 0955  NA 135 139 138 139 137  K 3.6 3.1* 3.3* 3.3* 3.6  CL 98* 104 104 107 107  CO2 '23 27 24 23 24  '$ GLUCOSE 218* 173* 130* 131* 105*  BUN '18 14 9 9 15  '$ CREATININE 1.05 0.88 0.95 0.80 0.78  CALCIUM 9.6 9.1 9.1 8.9 8.7*   Liver Function Tests:  Recent Labs Lab 09/24/15 2031 09/25/15 0528  AST 18 18  ALT 10* 10*  ALKPHOS 93 87  BILITOT 1.1 0.8  PROT 7.0 6.9  ALBUMIN 3.6 3.4*    Recent Labs Lab 09/24/15 2031  LIPASE 25    Recent Labs Lab 09/24/15 2031  AMMONIA 27   CBC:  Recent Labs Lab 09/24/15 1826 09/25/15 0528  WBC 8.2 5.4  HGB 12.8* 10.9*  HCT 38.1* 32.5*  MCV 86.4 86.9  PLT 227 191   Cardiac Enzymes:  Recent Labs Lab 09/24/15 2031  TROPONINI <0.03   BNP: BNP (last 3 results)  Recent Labs  09/08/15 2053  BNP 42.1    ProBNP (last 3 results) No results for  input(s): PROBNP in the last 8760 hours.  CBG:  Recent Labs Lab 09/30/15 0727 09/30/15 1155 09/30/15 1641 09/30/15 2112 10/01/15 0716  GLUCAP 155* 127* 92 148* 126*

## 2015-10-01 NOTE — Care Management Important Message (Signed)
Important Message  Patient Details  Name: Daryl Vega MRN: [id number] Date of Birth: 26-Jun-1946   Medicare Important Message Given:  Yes-third notification given    Camillo Flaming 10/01/2015, 12:26 [place] Message  Patient Details  Name: Daryl Vega MRN: [id number] Date of Birth: Oct 31, 1946   Medicare Important Message Given:  Yes-third notification given    Camillo Flaming 10/01/2015, 12:26 PM

## 2015-10-01 NOTE — Progress Notes (Signed)
Spoke with pt who referred me to his wife concerning discharge plan and HH.  Wife selected [facility] for [facility] needs. Referral given to in house rep.

## 2015-10-01 NOTE — Discharge Instructions (Signed)
Apixaban oral tablets What is this medicine? APIXABAN (a PIX a ban) is an anticoagulant (blood thinner). It is used to lower the chance of stroke in people with a medical condition called atrial fibrillation. It is also used to treat or prevent blood clots in the lungs or in the veins. This medicine may be used for other purposes; ask your health care provider or pharmacist if you have questions. What should I tell my health care provider before I take this medicine? They need to know if you have any of these conditions: -bleeding disorders -bleeding in the brain -blood in your stools (black or tarry stools) or if you have blood in your vomit -history of stomach bleeding -kidney disease -liver disease -mechanical heart valve -an unusual or allergic reaction to apixaban, other medicines, foods, dyes, or preservatives -pregnant or trying to get pregnant -breast-feeding How should I use this medicine? Take this medicine by mouth with a glass of water. Follow the directions on the prescription label. You can take it with or without food. If it upsets your stomach, take it with food. Take your medicine at regular intervals. Do not take it more often than directed. Do not stop taking except on your doctor's advice. Stopping this medicine may increase your risk of a blot clot. Be sure to refill your prescription before you run out of medicine. Talk to your pediatrician regarding the use of this medicine in children. Special care may be needed. Overdosage: If you think you have taken too much of this medicine contact a poison control center or emergency room at once. NOTE: This medicine is only for you. Do not share this medicine with others. What if I miss a dose? If you miss a dose, take it as soon as you can. If it is almost time for your next dose, take only that dose. Do not take double or extra doses. What may interact with this medicine? This medicine may interact with the following: -aspirin  and aspirin-like medicines -certain medicines for fungal infections like ketoconazole and itraconazole -certain medicines for seizures like carbamazepine and phenytoin -certain medicines that treat or prevent blood clots like warfarin, enoxaparin, and dalteparin -clarithromycin -NSAIDs, medicines for pain and inflammation, like ibuprofen or naproxen -rifampin -ritonavir -St. John's wort This list may not describe all possible interactions. Give your health care provider a list of all the medicines, herbs, non-prescription drugs, or dietary supplements you use. Also tell them if you smoke, drink alcohol, or use illegal drugs. Some items may interact with your medicine. What should I watch for while using this medicine? Notify your doctor or health care professional and seek emergency treatment if you develop breathing problems; changes in vision; chest pain; severe, sudden headache; pain, swelling, warmth in the leg; trouble speaking; sudden numbness or weakness of the face, arm, or leg. These can be signs that your condition has gotten worse. If you are going to have surgery, tell your doctor or health care professional that you are taking this medicine. Tell your health care professional that you use this medicine before you have a spinal or epidural procedure. Sometimes people who take this medicine have bleeding problems around the spine when they have a spinal or epidural procedure. This bleeding is very rare. If you have a spinal or epidural procedure while on this medicine, call your health care professional immediately if you have back pain, numbness or tingling (especially in your legs and feet), muscle weakness, paralysis, or loss of bladder or bowel  control. Avoid sports and activities that might cause injury while you are using this medicine. Severe falls or injuries can cause unseen bleeding. Be careful when using sharp tools or knives. Consider using an Copy. Take special care  brushing or flossing your teeth. Report any injuries, bruising, or red spots on the skin to your doctor or health care professional. What side effects may I notice from receiving this medicine? Side effects that you should report to your doctor or health care professional as soon as possible: -allergic reactions like skin rash, itching or hives, swelling of the face, lips, or tongue -signs and symptoms of bleeding such as bloody or black, tarry stools; red or dark-brown urine; spitting up blood or brown material that looks like coffee grounds; red spots on the skin; unusual bruising or bleeding from the eye, gums, or nose This list may not describe all possible side effects. Call your doctor for medical advice about side effects. You may report side effects to FDA at 1-800-FDA-1088. Where should I keep my medicine? Keep out of the reach of children. Store at room temperature between 20 and 25 degrees C (68 and 77 degrees F). Throw away any unused medicine after the expiration date. NOTE: This sheet is a summary. It may not cover all possible information. If you have questions about this medicine, talk to your doctor, pharmacist, or health care provider.    2016, Elsevier/[employer] Standard. (2013-08-05 11:59:24)

## 2015-10-05 ENCOUNTER — Other Ambulatory Visit: Payer: Self-pay | Admitting: Family Medicine

## 2015-10-05 ENCOUNTER — Ambulatory Visit
Admission: RE | Admit: 2015-10-05 | Discharge: 2015-10-05 | Disposition: A | Discharge status: Home / Self Care | Payer: Medicare Other | Source: Ambulatory Visit | Attending: Family Medicine | Admitting: Family Medicine

## 2015-10-05 DIAGNOSIS — R4182 Altered mental status, unspecified: Secondary | ICD-10-CM

## 2015-10-05 DIAGNOSIS — Z85118 Personal history of other malignant neoplasm of bronchus and lung: Secondary | ICD-10-CM

## 2015-11-15 ENCOUNTER — Other Ambulatory Visit: Payer: Self-pay | Admitting: Nurse Practitioner

## 2015-12-12 IMAGING — CR DG ORBITS FOR FOREIGN BODY
2 series · 2 of 2 positions shown · non-contrast
Comparison: Sinus CT 07/17/2014

CLINICAL DATA: Implanted hearing aids.  Pre MRI evaluation.

EXAM:
ORBITS FOR FOREIGN BODY - 2 VIEW

[w waters (1 of 2)]
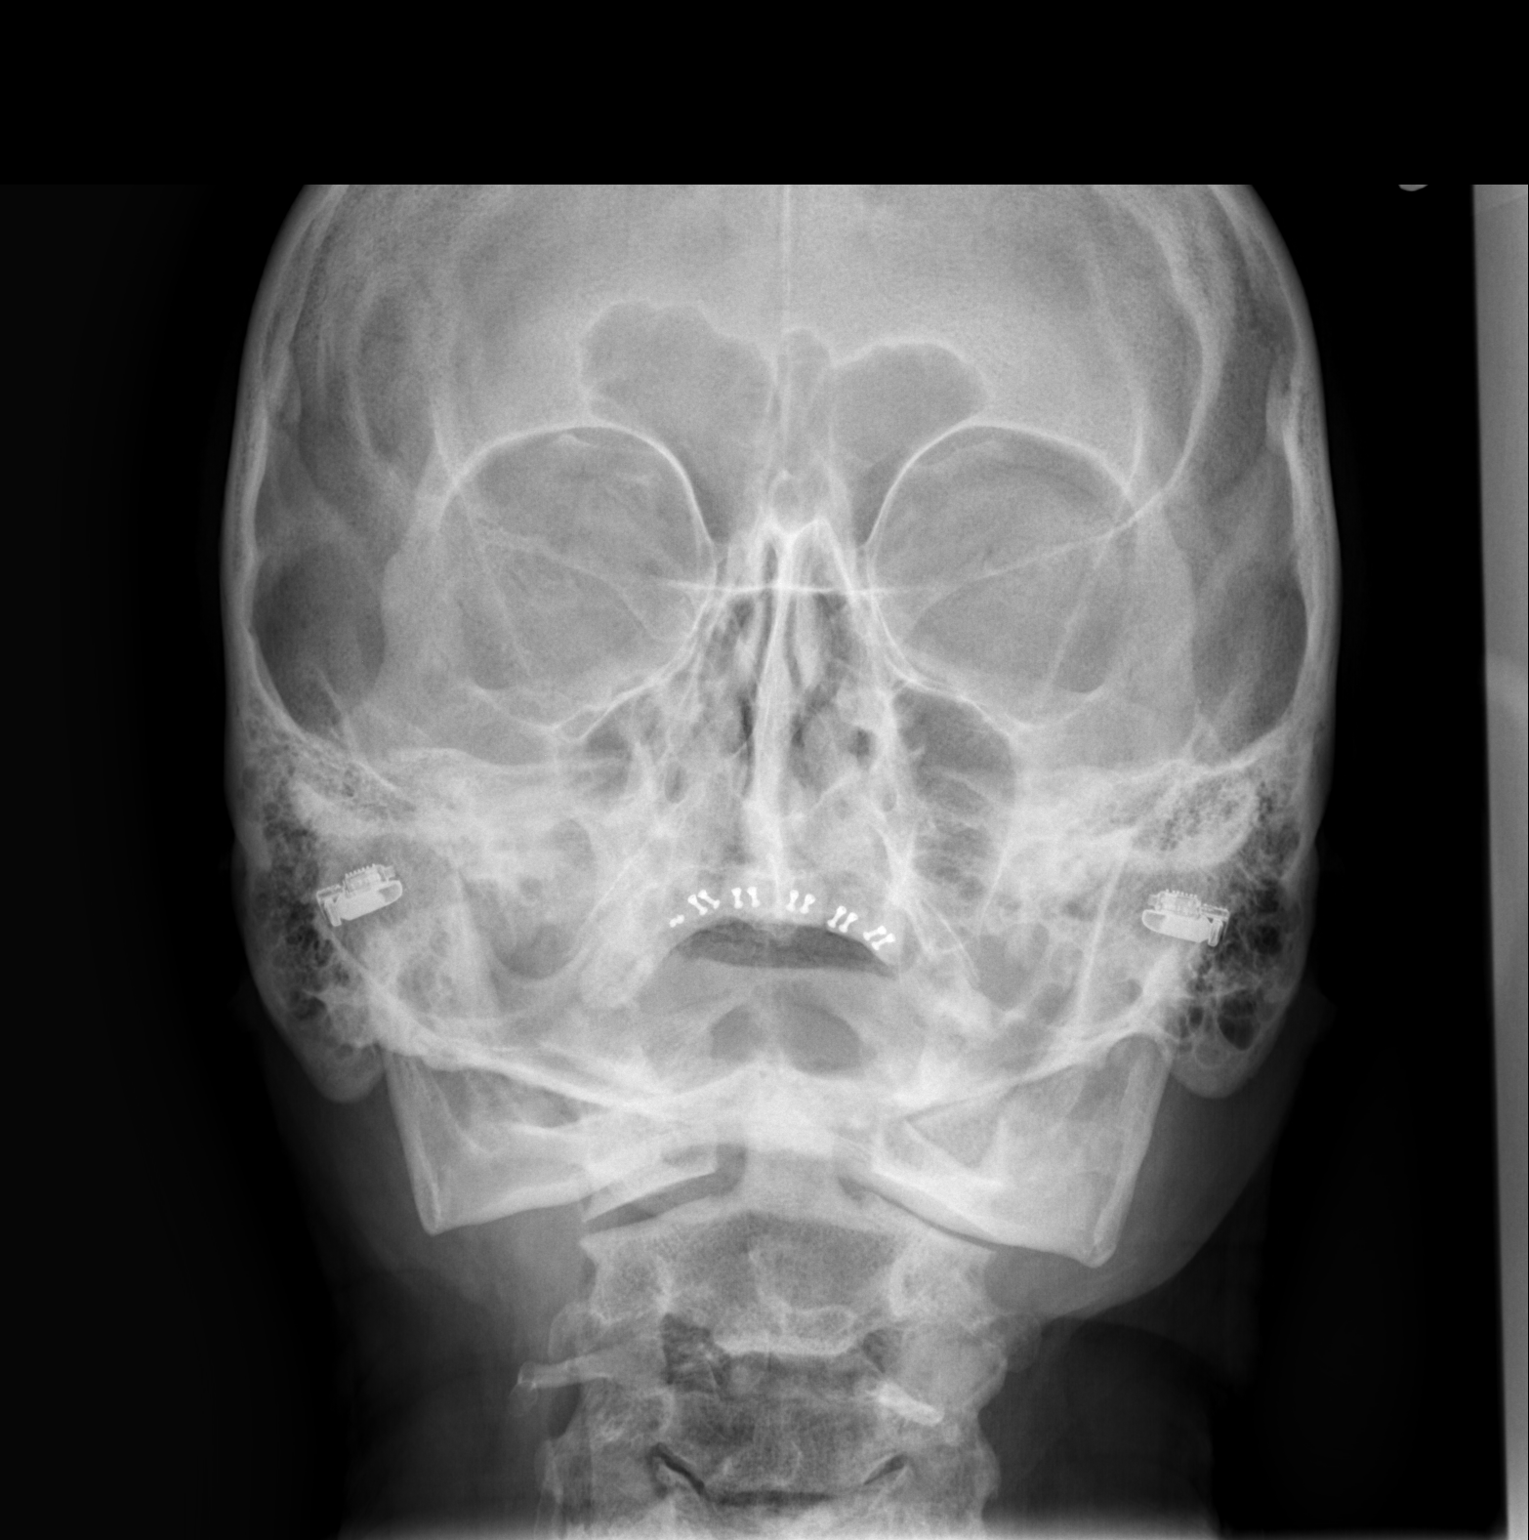

[w waters (2 of 2)]
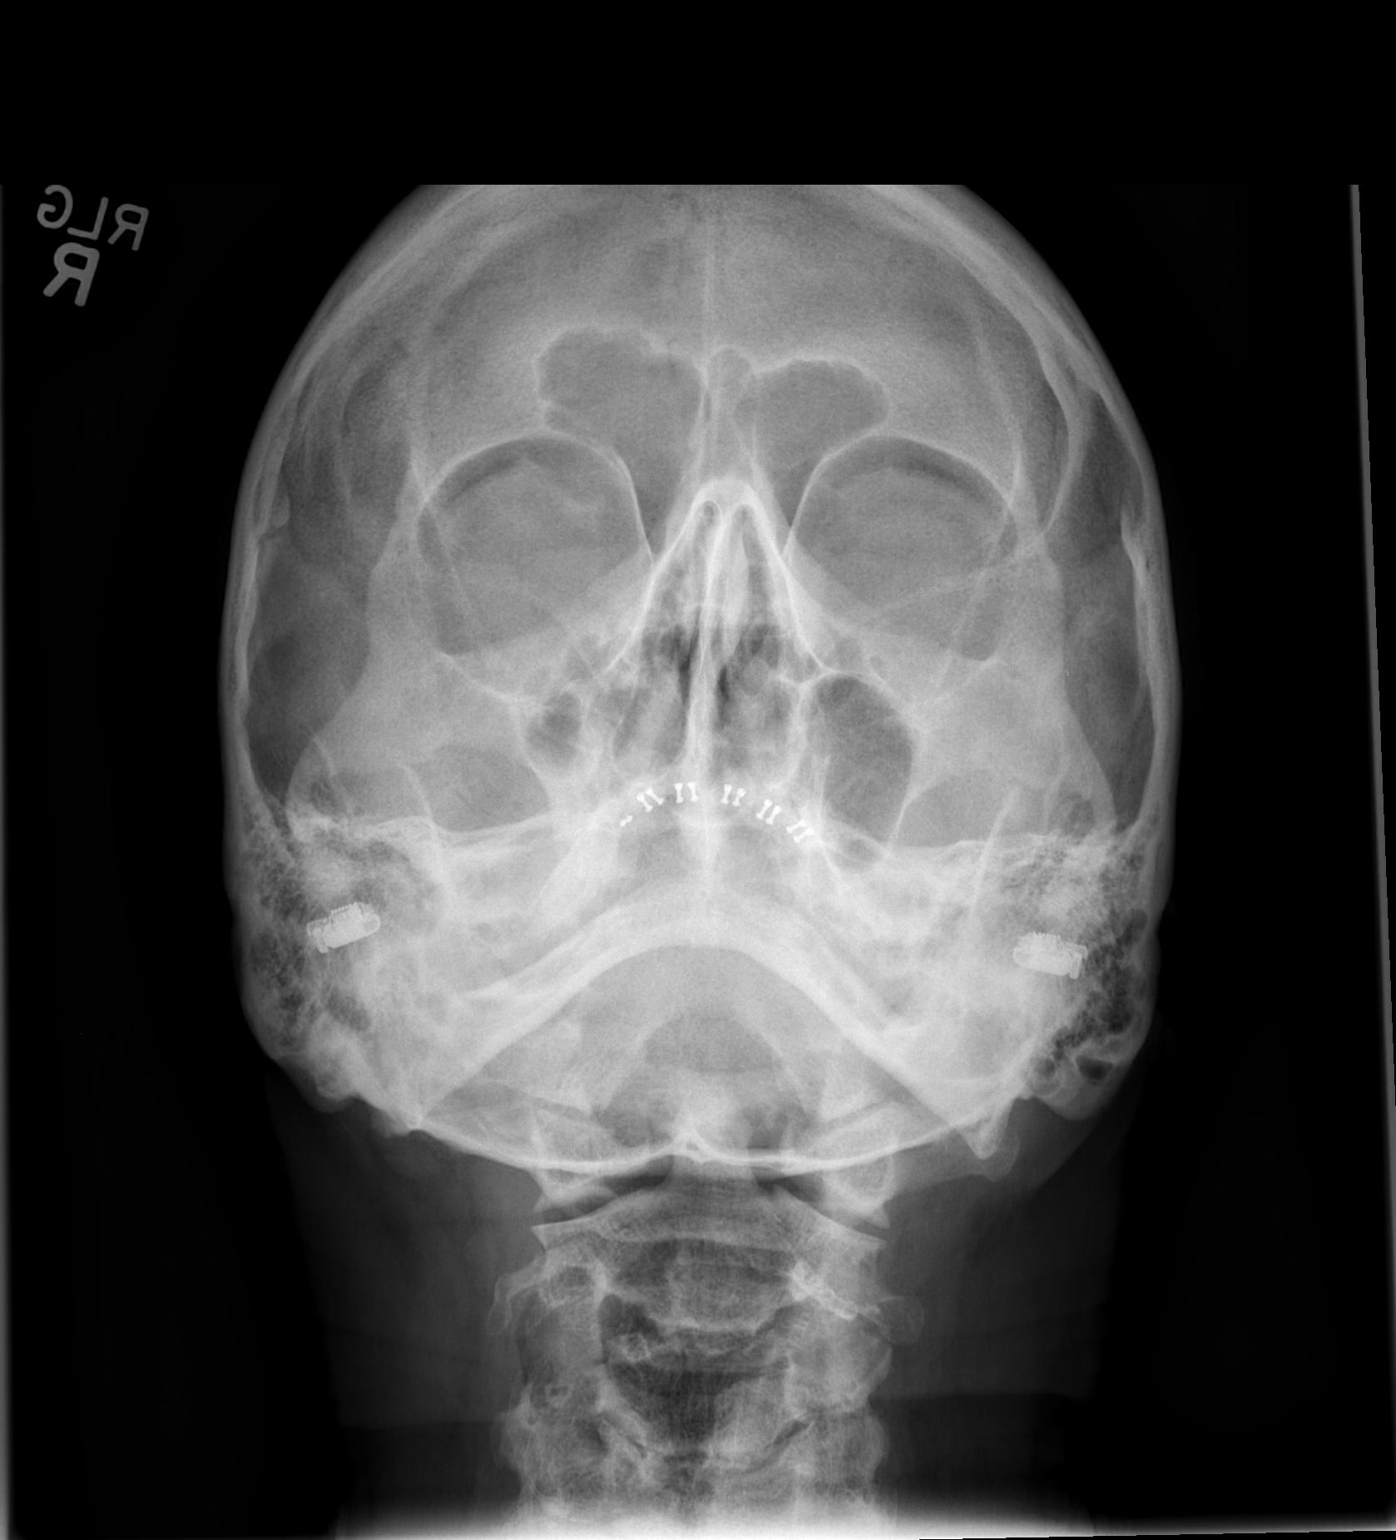

[2 of 2 positions shown; findings below may reference images not displayed]

FINDINGS: Evidence for bilateral electronic hearing aid devices. No radiopaque
foreign bodies or metallic structures in the region of the orbits.
Small metallic structures associated with the maxilla.
IMPRESSION: No evidence of metallic foreign body within the orbits.

Bilateral hearing aid devices.

## 2016-02-14 ENCOUNTER — Other Ambulatory Visit: Payer: Self-pay | Admitting: Pain Medicine

## 2016-02-14 ENCOUNTER — Ambulatory Visit
Admission: RE | Admit: 2016-02-14 | Discharge: 2016-02-14 | Disposition: A | Discharge status: Home / Self Care | Payer: Medicare HMO | Source: Ambulatory Visit | Attending: Pain Medicine | Admitting: Pain Medicine

## 2016-02-14 DIAGNOSIS — M25512 Pain in left shoulder: Principal | ICD-10-CM

## 2016-02-14 DIAGNOSIS — M542 Cervicalgia: Secondary | ICD-10-CM

## 2016-02-14 DIAGNOSIS — G8929 Other chronic pain: Secondary | ICD-10-CM

## 2016-05-19 ENCOUNTER — Telehealth: Payer: Self-pay | Admitting: *Deleted

## 2016-05-19 NOTE — Telephone Encounter (Signed)
"  This is Daryl Vega wife of Daryl Vega, patient of Dr. Julien Vega.  He had emergency Gastro-Surgery and was told he is to not receive anymore chemotherapy.  He is in a great deal of pain so I am starting here in case his cancer has resurfaced.  Was he to follow up with Dr. Julien Vega even though he is not to receive chemotherapy?  Has a great deal of pain all over mostly to his neck, shoulders, arms and stomach.  Also trouble breathing.  Pain clinic let him go saying he tested positive for morphine.  He only gets morphine when he's in the hospital and has not been in the hospital so we don't know why or how this happened.  We're waiting for PCP to refer [place] to a different pain clinic if one accepts our insurance."  Advised he go to Ed for unresolved pain.  Will notify Dr. Julien Vega.  Last office note 11-21-2014.   No F/U after hospital discharge.   Daryl Vega says she is "concerned cancer has returned due to the pain in his chest".  This nurse tried further assessment asking what side of his chest hurts.  Daryl Vega asked him twice and he yells both times "my chest don't hurt".  Return number [phone].

## 2016-06-02 ENCOUNTER — Ambulatory Visit: Payer: Medicare HMO | Attending: Pain Medicine | Admitting: Physical Therapy

## 2016-06-02 VITALS — HR 91

## 2016-06-02 DIAGNOSIS — M25612 Stiffness of left shoulder, not elsewhere classified: Secondary | ICD-10-CM | POA: Diagnosis present

## 2016-06-02 DIAGNOSIS — M25512 Pain in left shoulder: Secondary | ICD-10-CM

## 2016-06-02 DIAGNOSIS — M6281 Muscle weakness (generalized): Secondary | ICD-10-CM

## 2016-06-02 NOTE — Therapy (Signed)
[facility] [place], [place], [address] Phone: [phone]   Fax:  [phone]  Physical Therapy Evaluation  Patient Details  Name: Daryl Vega MRN: [id number] Date of Birth: 1946-08-17 Referring Provider: Johnny Bridge MD.  Encounter Date: 06/02/2016      PT End of Session - 06/02/16 1203    Visit Number 1   Number of Visits 16   Date for PT Re-Evaluation 08/01/16   PT Start Time 1856   PT Stop Time 1133   PT Time Calculation (min) 58 min      Past Medical History  Diagnosis Date  . DM (diabetes mellitus) ([place])   . Pneumonia   . Hypercholesteremia   . Melanoma ([place])   . Radiation 08/03/14-08/23/14    35 gray to right chest  . Lung cancer ([place])   . FH: chemotherapy   . Gastroparesis 02/09/2015  . Cholecystoduodenal fistula   . Pulmonary embolus ([place])   . Duodenal ulcer   . COPD (chronic obstructive pulmonary disease) ([place])   . Protein calorie malnutrition ([place])   . Elevated LFTs   . C. difficile enteritis   . Anemia     Past Surgical History  Procedure Laterality Date  . Video bronchoscopy Bilateral 07/20/2014    Procedure: VIDEO BRONCHOSCOPY WITHOUT FLUORO;  Surgeon: Tanda Rockers, MD;  Location: [facility];  Service: Cardiopulmonary;  Laterality: Bilateral;  . Esophagogastroduodenoscopy N/A 11/30/2014    Procedure: ESOPHAGOGASTRODUODENOSCOPY (EGD);  Surgeon: [name], MD;  Location: [facility];  Service: Endoscopy;  Laterality: N/A;  . Laparotomy N/A 11/30/2014    Procedure: EXPLORATORY LAPAROTOMY, PYLOROPLASTY, OVERSEWING OF POSTERIOR DUODENUM, DUODENOSTOMY, CHOLECYSTECTOMY, JEJEUNOSTOMY;  Surgeon: Jackolyn Confer, MD;  Location: [facility];  Service: General;  Laterality: N/A;  . Pyloroplasty    . Cholecystectomy    . Duodenostomy tube    . Jejunostomy feeding tube      Filed Vitals:   06/02/16 1412  Pulse: 91  SpO2: 93%         Subjective Assessment - 06/02/16 1413    Subjective  I hurt all over but especially my left shoulder and neck.   Limitations Walking   How long can you walk comfortably? Short distances.   Currently in Pain? Yes   Pain Score 9    Pain Location Shoulder   Pain Orientation Left   Pain Descriptors / Indicators Aching;Constant   Pain Type Chronic pain   Pain Onset More than a month ago   Pain Frequency Constant   Aggravating Factors  Moving.   Pain Relieving Factors "Nothing."            [facility] PT Assessment - 06/02/16 0001    Assessment   Medical Diagnosis Deconditioning and left shoulder pain.   Referring Provider Johnny Bridge MD.   Onset Date/Surgical Date --  07/2014.   Precautions   Precaution Comments Supervised gait.  Please monitor 02 sat and HR.   Restrictions   Weight Bearing Restrictions No   Balance Screen   Has the patient fallen in the past 6 months No   Has the patient had a decrease in activity level because of a fear of falling?  No   Is the patient reluctant to leave their home because of a fear of falling?  No   Home Ecologist residence   Prior Function   Level of Independence Independent   Posture/Postural Control   Posture/Postural Control Postural limitations   Postural  Limitations Rounded Shoulders;Forward head;Decreased lumbar lordosis   ROM / Strength   AROM / PROM / Strength AROM;Strength   AROM   Overall AROM Comments Left shoulder flexion limited to 80 degrees and ER= 14 degrees.  Bilateral LE ROM is WFL.   Strength   Overall Strength Comments Left shoulder flexion and abduction= 4-/5.  Essentially normal bilateral LE strength.   Palpation   Palpation comment CC is that of referred pain into medial deltoid region.  He also c/o palpable pain over his left UT and acromial ridge region.   Special Tests    Special Tests --  Positive left shoulder impingement.   Transfers   Transfers --  Sit to stand with armrest push off.   Ambulation/Gait   Gait Pattern Decreased  arm swing - right;Decreased arm swing - left;Decreased step length - right;Decreased step length - left;Decreased stance time - right;Decreased stance time - left;Decreased stride length   Gait Comments Slow and purposeful gait pattern.   Balance   Balance Assessed --  Negative Romberg test.                   [facility] Adult PT Treatment/Exercise - 06/02/16 0001    Exercises   Exercises Knee/Hip   Knee/Hip Exercises: Aerobic   Nustep Level 1 x 8 minutes.   Modalities   Modalities Education officer, environmental Stimulation Location Left shoulder.   Electrical Stimulation Action IFC   Electrical Stimulation Parameters 80-150 Hz at 100% scan x 15 minutes.   Electrical Stimulation Goals Pain                  PT Short Term Goals - 06/02/16 1511    PT SHORT TERM GOAL #1   Title Independent with an initial HEP.   Time 2   Period Weeks   Status New           PT Long Term Goals - 06/02/16 1512    PT LONG TERM GOAL #1   Title Independent with an advanced HEP.   Time 8   Period Weeks   Status New   PT LONG TERM GOAL #2   Title (p) Active left shoulder flexion to 145 degrees so the patient can easily reach overhead   Time (p) 8   Period (p) Weeks   Status (p) New   PT LONG TERM GOAL #3   Title (p) Active ER to 70 degrees+ to allow for easily donning/doffing of apparel   Time (p) 8   Period (p) Weeks   Status (p) New   PT LONG TERM GOAL #4   Title (p) Perform ADL's wiht pain not > 3-4/10.   Time (p) 8   Period (p) Weeks   Status (p) New   PT LONG TERM GOAL #5   Title (p) Perform 20 minutes of sustained aerobic activity with an 02 sat not below 93%.   Time (p) 8   Period (p) Weeks   Status (p) New               Plan - 06/02/16 1423    Clinical Impression Statement The patient reports a decline in his conditioning began in 2015.  He states he underwent a surgery for ulcer in 2015 but states that he had a  "bloodclot."  He had a long hospitalization and was fed via a PEG tube.  He states he became very deconditioning and also reports he began to experience  increase pain over multi joints but especially his left shoulder and left side of neck.  His left shoulder pain is rated at a 9/10 and is isvery stiff.  He exhibits impingement symptoms as well.  His 02 sat at rest was 93.  He is quite deconditioned at this point.   Rehab Potential Good   PT Frequency 2x / week   PT Duration 8 weeks   PT Treatment/Interventions ADLs/Self Care Home Management;Electrical Stimulation;Moist Heat;Therapeutic exercise;Therapeutic activities;Gait training;Ultrasound;Neuromuscular re-education;Patient/family education;Manual techniques;Passive range of motion   PT Next Visit Plan Nustep; general conditioning and strengthening; left shoulder ROM; STW/M to left shoulder and UT; modalities PRN.      Patient will benefit from skilled therapeutic intervention in order to improve the following deficits and impairments:  Abnormal gait, Decreased activity tolerance, Decreased range of motion, Decreased strength, Pain, Decreased endurance  Visit Diagnosis: Muscle weakness (generalized) - Plan: PT plan of care cert/re-cert  Pain in left shoulder - Plan: PT plan of care cert/re-cert  Stiffness of left shoulder, not elsewhere classified - Plan: PT plan of care cert/re-cert      G-Codes - 99/35/70 1512    Functional Assessment Tool Used Clinical judgement....   Functional Limitation Mobility: Walking and moving around   Mobility: Walking and Moving Around Current Status [phone]) At least 40 percent but less than 60 percent impaired, limited or restricted   Mobility: Walking and Moving Around Goal Status [phone]) At least 20 percent but less than 40 percent impaired, limited or restricted       Problem List Patient Active Problem List   Diagnosis Date Noted  . Hallucination 09/27/2015  . Antineoplastic chemotherapy induced  anemia 09/26/2015  . Anemia of chronic disease 09/26/2015  . Dyslipidemia associated with type 2 diabetes mellitus ([place]) 09/26/2015  . Type 2 diabetes, controlled, 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[place]) 09/26/2015  . Polypharmacy 09/25/2015  . Acute encephalopathy 09/25/2015  . Recurrent falls 09/25/2015  . Pulmonary embolism ([place]) 01/06/2015  . Enteritis due to Clostridium difficile 12/19/2014  . Protein-calorie malnutrition, severe ([place]) 11/30/2014  . 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 11/30/2014  . Bleeding duodenal ulcer s/p ex lap/oversew 11/30/2014 11/30/2014  . Hypokalemia 10/25/2014  . Lung cancer ([place]) 07/28/2014  . 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 COPD mixed type ([place]) 06/28/2014    Mckyla Deckman, [name] MPT 06/02/2016, 4:43 PM  [facility] [address] [place], [place], [address] Phone: [phone]   Fax:  [phone]  Name: Daryl Vega MRN: [id number] Date of Birth: 27-Aug-1946

## 2016-06-04 ENCOUNTER — Ambulatory Visit: Payer: Medicare HMO | Admitting: Physical Therapy

## 2016-06-04 DIAGNOSIS — M25512 Pain in left shoulder: Secondary | ICD-10-CM

## 2016-06-04 DIAGNOSIS — M6281 Muscle weakness (generalized): Secondary | ICD-10-CM | POA: Diagnosis not present

## 2016-06-04 DIAGNOSIS — M25612 Stiffness of left shoulder, not elsewhere classified: Secondary | ICD-10-CM

## 2016-06-04 NOTE — Therapy (Signed)
[facility] [place], [place], [address] Phone: [phone]   Fax:  [phone]  Physical Therapy Treatment  Patient Details  Name: Daryl Vega MRN: [id number] Date of Birth: 1946/11/11 Referring Provider: Johnny Bridge MD.  Encounter Date: 06/04/2016      PT End of Session - 06/04/16 1418    Visit Number 2   Number of Visits 16   Date for PT Re-Evaluation 08/01/16   PT Start Time 0945   PT Stop Time 9563  Patient limited by fatique.   PT Time Calculation (min) 45 min   Activity Tolerance Patient tolerated treatment well;Patient limited by fatigue   Behavior During Therapy [facility] for tasks assessed/performed      Past Medical History  Diagnosis Date  . DM (diabetes mellitus) ([place])   . Pneumonia   . Hypercholesteremia   . Melanoma ([place])   . Radiation 08/03/14-08/23/14    35 gray to right chest  . Lung cancer ([place])   . FH: chemotherapy   . Gastroparesis 02/09/2015  . Cholecystoduodenal fistula   . Pulmonary embolus ([place])   . Duodenal ulcer   . COPD (chronic obstructive pulmonary disease) ([place])   . Protein calorie malnutrition ([place])   . Elevated LFTs   . C. difficile enteritis   . Anemia     Past Surgical History  Procedure Laterality Date  . Video bronchoscopy Bilateral 07/20/2014    Procedure: VIDEO BRONCHOSCOPY WITHOUT FLUORO;  Surgeon: Tanda Rockers, MD;  [facility];  Service: Cardiopulmonary;  Laterality: Bilateral;  . Esophagogastroduodenoscopy N/A 11/30/2014    Procedure: ESOPHAGOGASTRODUODENOSCOPY (EGD);  Surgeon: [name], MD;  Location: [facility];  Service: Endoscopy;  Laterality: N/A;  . Laparotomy N/A 11/30/2014    Procedure: EXPLORATORY LAPAROTOMY, PYLOROPLASTY, OVERSEWING OF POSTERIOR DUODENUM, DUODENOSTOMY, CHOLECYSTECTOMY, JEJEUNOSTOMY;  Surgeon: Jackolyn Confer, MD;  Location: [facility];  Service: General;  Laterality: N/A;  . Pyloroplasty    . Cholecystectomy    .  Duodenostomy tube    . Jejunostomy feeding tube      Filed Vitals:   06/04/16 1412  SpO2: 91%        Subjective Assessment - 06/04/16 1412    Subjective That treatment helped my neck.   Limitations Walking   How long can you walk comfortably? Short distances.   Pain Score 7    Pain Location Shoulder   Pain Orientation Left   Pain Descriptors / Indicators Aching;Constant   Pain Type Chronic pain   Pain Onset More than a month ago                         [facility] Adult PT Treatment/Exercise - 06/04/16 0001    Knee/Hip Exercises: Aerobic   Nustep Level 1 x 17 minutes.   Acupuncturist Location Left shoulder/UT.   Electrical Stimulation Action IFC   Electrical Stimulation Parameters 80-150 Hz at 100% scan x 20 minutes.   Electrical Stimulation Goals Pain                  PT Short Term Goals - 06/02/16 1511    PT SHORT TERM GOAL #1   Title Independent with an initial HEP.   Time 2   Period Weeks   Status New           PT Long Term Goals - 06/02/16 1512    PT LONG TERM GOAL #1   Title Independent with an  advanced HEP.   Time 8   Period Weeks   Status New   PT LONG TERM GOAL #2   Title (p) Active left shoulder flexion to 145 degrees so the patient can easily reach overhead   Time (p) 8   Period (p) Weeks   Status (p) New   PT LONG TERM GOAL #3   Title (p) Active ER to 70 degrees+ to allow for easily donning/doffing of apparel   Time (p) 8   Period (p) Weeks   Status (p) New   PT LONG TERM GOAL #4   Title (p) Perform ADL's wiht pain not > 3-4/10.   Time (p) 8   Period (p) Weeks   Status (p) New   PT LONG TERM GOAL #5   Title (p) Perform 20 minutes of sustained aerobic activity with an 02 sat not below 93%.   Time (p) 8   Period (p) Weeks   Status (p) New               Plan - 06/04/16 1413    PT Treatment/Interventions ADLs/Self Care Home Management;Electrical Stimulation;Moist  Heat;Therapeutic exercise;Therapeutic activities;Gait training;Neuromuscular re-education;Patient/family education;Manual techniques;Passive range of motion   PT Next Visit Plan No ultrasound.      Patient will benefit from skilled therapeutic intervention in order to improve the following deficits and impairments:  Abnormal gait, Decreased activity tolerance, Decreased range of motion, Decreased strength, Pain, Decreased endurance  Visit Diagnosis: Muscle weakness (generalized)  Pain in left shoulder  Stiffness of left shoulder, not elsewhere classified     Problem List Patient Active Problem List   Diagnosis Date Noted  . Hallucination 09/27/2015  . Antineoplastic chemotherapy induced anemia 09/26/2015  . Anemia of chronic disease 09/26/2015  . Dyslipidemia associated with type 2 diabetes mellitus ([place]) 09/26/2015  . Type 2 diabetes, controlled, 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[place]) 09/26/2015  . Polypharmacy 09/25/2015  . Acute encephalopathy 09/25/2015  . Recurrent falls 09/25/2015  . Pulmonary embolism ([place]) 01/06/2015  . Enteritis due to Clostridium difficile 12/19/2014  . Protein-calorie malnutrition, severe ([place]) 11/30/2014  . 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 11/30/2014  . Bleeding duodenal ulcer s/p ex lap/oversew 11/30/2014 11/30/2014  . Hypokalemia 10/25/2014  . Lung cancer ([place]) 07/28/2014  . 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 COPD mixed type ([place]) 06/28/2014    Jullien Granquist, [name] MPT 06/04/2016, 2:19 PM  [facility] [address] [place], [place], [address] Phone: [phone]   Fax:  [phone]  Name: Daryl Vega MRN: [id number] Date of Birth: 02-12-1946

## 2016-06-09 ENCOUNTER — Ambulatory Visit: Payer: Medicare HMO | Admitting: Physical Therapy

## 2016-06-09 DIAGNOSIS — M25612 Stiffness of left shoulder, not elsewhere classified: Secondary | ICD-10-CM

## 2016-06-09 DIAGNOSIS — M6281 Muscle weakness (generalized): Secondary | ICD-10-CM

## 2016-06-09 DIAGNOSIS — M25512 Pain in left shoulder: Secondary | ICD-10-CM

## 2016-06-09 NOTE — Therapy (Signed)
[facility] [place], [place], [address] Phone: [phone]   Fax:  [phone]  Physical Therapy Treatment  Patient Details  Name: Daryl Vega MRN: [id number] Date of Birth: [birth year] Referring Provider: Johnny Bridge MD.  Encounter Date: 06/09/2016      PT End of Session - 06/09/16 1146    Visit Number 3   Number of Visits 16   Date for PT Re-Evaluation 08/01/16   PT Start Time 2585   PT Stop Time 1156   PT Time Calculation (min) 35 min   Activity Tolerance Patient tolerated treatment well;Patient limited by fatigue   Behavior During Therapy [facility] for tasks assessed/performed      Past Medical History  Diagnosis Date  . DM (diabetes mellitus) ([place])   . Pneumonia   . Hypercholesteremia   . Melanoma ([place])   . Radiation 08/03/14-08/23/14    35 gray to right chest  . Lung cancer ([place])   . FH: chemotherapy   . Gastroparesis 02/09/2015  . Cholecystoduodenal fistula   . Pulmonary embolus ([place])   . Duodenal ulcer   . COPD (chronic obstructive pulmonary disease) ([place])   . Protein calorie malnutrition ([place])   . Elevated LFTs   . C. difficile enteritis   . Anemia     Past Surgical History  Procedure Laterality Date  . Video bronchoscopy Bilateral 07/20/2014    Procedure: VIDEO BRONCHOSCOPY WITHOUT FLUORO;  Surgeon: Tanda Rockers, MD;  [facility];  Service: Cardiopulmonary;  Laterality: Bilateral;  . Esophagogastroduodenoscopy N/A 11/30/2014    Procedure: ESOPHAGOGASTRODUODENOSCOPY (EGD);  Surgeon: [name], MD;  Location: [facility];  Service: Endoscopy;  Laterality: N/A;  . Laparotomy N/A 11/30/2014    Procedure: EXPLORATORY LAPAROTOMY, PYLOROPLASTY, OVERSEWING OF POSTERIOR DUODENUM, DUODENOSTOMY, CHOLECYSTECTOMY, JEJEUNOSTOMY;  Surgeon: Jackolyn Confer, MD;  [facility];  Service: General;  Laterality: N/A;  . Pyloroplasty    . Cholecystectomy    . Duodenostomy tube    . Jejunostomy  feeding tube      Filed Vitals:   06/09/16 1123  SpO2: 95%        Subjective Assessment - 06/09/16 1123    Subjective Patient reported feeling very sick today due to medical condition   Limitations Walking   How long can you walk comfortably? Short distances.   Currently in Pain? Yes   Pain Score 6    Pain Location Shoulder   Pain Orientation Left   Pain Descriptors / Indicators Aching   Pain Type Chronic pain   Pain Onset More than a month ago   Pain Frequency Constant   Aggravating Factors  any prolong use or activity   Pain Relieving Factors nothing at this time                         [facility] Adult PT Treatment/Exercise - 06/09/16 0001    Exercises   Exercises Knee/Hip;Shoulder;Lumbar   Knee/Hip Exercises: Aerobic   Nustep L2 x10 O2 95% pre and 93% post, monitored    Shoulder Exercises: Pulleys   Flexion 2 minutes   Other Pulley Exercises seated UE ranger for flexion and circles x61mn   Electrical Stimulation   Electrical Stimulation Location Left shoulder/UT.   Electrical Stimulation Action premod   Electrical Stimulation Parameters 80-'150hz'$    Electrical Stimulation Goals Pain                PT Education - 06/09/16 1149    Education provided  Yes   Education Details HEP   Person(s) Educated Patient   Methods Explanation;Demonstration;Handout   Comprehension Verbalized understanding;Returned demonstration          PT Short Term Goals - 06/09/16 1153    PT SHORT TERM GOAL #1   Title Independent with an initial HEP.   Time 2   Period Weeks   Status Achieved           PT Long Term Goals - 06/09/16 1154    PT LONG TERM GOAL #1   Title Independent with an advanced HEP.   Time 8   Period Weeks   Status On-going   PT LONG TERM GOAL #2   Title Active left shoulder flexion to 145 degrees so the patient can easily reach overhead   Time 8   Period Weeks   Status On-going   PT LONG TERM GOAL #3   Title Active ER to 70 degrees+  to allow for easily donning/doffing of apparel   Time 8   Period Weeks   Status On-going   PT LONG TERM GOAL #4   Title Perform ADL's wiht pain not > 3-4/10.   Time 8   Period Weeks   Status On-going   PT LONG TERM GOAL #5   Title Perform 20 minutes of sustained aerobic activity with an 02 sat not below 93%.   Time 8   Period Weeks   Status On-going               Plan - 06/09/16 1149    Clinical Impression Statement Patient progressing slowly due to being very sick today due to other medical issues and needed to keep treatment light today. Patient able to tolerate all activities with O2 at 93% or above. Monitored patient throughtout treatment  today with cues for pace and technique to provide comfort and endurance activity. HEP given today for gentle supine cane flexion. STG #1 met other goals ongoing due to ROM and endurance activity deficits.   Rehab Potential Good   PT Frequency 2x / week   PT Duration 8 weeks   PT Treatment/Interventions ADLs/Self Care Home Management;Electrical Stimulation;Moist Heat;Therapeutic exercise;Therapeutic activities;Gait training;Neuromuscular re-education;Patient/family education;Manual techniques;Passive range of motion   PT Next Visit Plan Cont with POC foe shoulder ROM and overall deconditioning per MPT/ monitor O2 and No ultrasound.   Consulted and Agree with Plan of Care Patient      Patient will benefit from skilled therapeutic intervention in order to improve the following deficits and impairments:  Abnormal gait, Decreased activity tolerance, Decreased range of motion, Decreased strength, Pain, Decreased endurance  Visit Diagnosis: Muscle weakness (generalized)  Pain in left shoulder  Stiffness of left shoulder, not elsewhere classified     Problem List Patient Active Problem List   Diagnosis Date Noted  . Hallucination 09/27/2015  . Antineoplastic chemotherapy induced anemia 09/26/2015  . Anemia of chronic disease  09/26/2015  . Dyslipidemia associated with type 2 diabetes mellitus ([place]) 09/26/2015  . Type 2 diabetes, controlled, 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[place]) 09/26/2015  . Polypharmacy 09/25/2015  . Acute encephalopathy 09/25/2015  . Recurrent falls 09/25/2015  . Pulmonary embolism ([place]) 01/06/2015  . Enteritis due to Clostridium difficile 12/19/2014  . Protein-calorie malnutrition, severe ([place]) 11/30/2014  . 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 11/30/2014  . Bleeding duodenal ulcer s/p ex lap/oversew 11/30/2014 11/30/2014  . Hypokalemia 10/25/2014  . Lung cancer ([place]) 07/28/2014  . 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  and BI 50% obst  07/20/2014  . COPD mixed type ([place]) 06/28/2014    Janellie Tennison P, PTA 06/09/2016, 11:58 AM  [facility] [place], [place], [address] Phone: [phone]   Fax:  [phone]  Name: Drako Maese MRN: [id number] Date of Birth: November 15, 1946

## 2016-06-09 NOTE — Patient Instructions (Signed)
  ROM: Flexion - Wand (Supine)   Lie on back holding wand. Raise arms over head.  Repeat __10__ times per set. Do _2-3___ sets per session. Do _2___ sessions per day.  [email or web address].exer.[email or web address]   Copyright  VHI. All rights reserved.

## 2016-06-11 ENCOUNTER — Ambulatory Visit: Payer: Medicare HMO | Admitting: Physical Therapy

## 2016-06-11 ENCOUNTER — Encounter: Payer: Self-pay | Admitting: Physical Therapy

## 2016-06-11 DIAGNOSIS — M25612 Stiffness of left shoulder, not elsewhere classified: Secondary | ICD-10-CM

## 2016-06-11 DIAGNOSIS — M25512 Pain in left shoulder: Secondary | ICD-10-CM

## 2016-06-11 DIAGNOSIS — M6281 Muscle weakness (generalized): Secondary | ICD-10-CM | POA: Diagnosis not present

## 2016-06-11 NOTE — Therapy (Signed)
[facility] [place], [place], [address] Phone: [phone]   Fax:  [phone]  Physical Therapy Treatment  Patient Details  Name: Daryl Vega MRN: [id number] Date of Birth: 1946-04-24 Referring Provider: Johnny Bridge MD.  Encounter Date: 06/11/2016      PT End of Session - 06/11/16 1146    Visit Number 4   Number of Visits 16   Date for PT Re-Evaluation 08/01/16   PT Start Time 1115   PT Stop Time 1201   PT Time Calculation (min) 46 min   Activity Tolerance Patient tolerated treatment well;Patient limited by fatigue   Behavior During Therapy [facility] for tasks assessed/performed      Past Medical History  Diagnosis Date  . DM (diabetes mellitus) ([place])   . Pneumonia   . Hypercholesteremia   . Melanoma ([place])   . Radiation 08/03/14-08/23/14    35 gray to right chest  . Lung cancer ([place])   . FH: chemotherapy   . Gastroparesis 02/09/2015  . Cholecystoduodenal fistula   . Pulmonary embolus ([place])   . Duodenal ulcer   . COPD (chronic obstructive pulmonary disease) ([place])   . Protein calorie malnutrition ([place])   . Elevated LFTs   . C. difficile enteritis   . Anemia     Past Surgical History  Procedure Laterality Date  . Video bronchoscopy Bilateral 07/20/2014    Procedure: VIDEO BRONCHOSCOPY WITHOUT FLUORO;  Surgeon: Tanda Rockers, MD;  [facility];  Service: Cardiopulmonary;  Laterality: Bilateral;  . Esophagogastroduodenoscopy N/A 11/30/2014    Procedure: ESOPHAGOGASTRODUODENOSCOPY (EGD);  Surgeon: [name], MD;  [facility];  Service: Endoscopy;  Laterality: N/A;  . Laparotomy N/A 11/30/2014    Procedure: EXPLORATORY LAPAROTOMY, PYLOROPLASTY, OVERSEWING OF POSTERIOR DUODENUM, DUODENOSTOMY, CHOLECYSTECTOMY, JEJEUNOSTOMY;  Surgeon: Jackolyn Confer, MD;  [facility];  Service: General;  Laterality: N/A;  . Pyloroplasty    . Cholecystectomy    . Duodenostomy tube    . Jejunostomy  feeding tube      Filed Vitals:   06/11/16 1120  SpO2: 95%        Subjective Assessment - 06/11/16 1120    Subjective Patient reported feeling better today and wants to progress slowly with exercises   Limitations Walking   How long can you walk comfortably? Short distances.   Currently in Pain? Yes   Pain Score 6    Pain Location Shoulder   Pain Orientation Left   Pain Descriptors / Indicators Aching   Pain Type Chronic pain   Pain Onset More than a month ago   Pain Frequency Constant   Aggravating Factors  prolong activity   Pain Relieving Factors nothing at this time reported            [facility] PT Assessment - 06/11/16 0001    AROM   Overall AROM  Deficits   Overall AROM Comments Left shoulder AROM 94 degrees shoulder flexion                     [facility] Adult PT Treatment/Exercise - 06/11/16 0001    Knee/Hip Exercises: Aerobic   Nustep L2 x15 O2 95% pre, 94% midway and 92% post, monitored throughout   Shoulder Exercises: Pulleys   Flexion --  80mn   Other Pulley Exercises seated UE ranger for flexion and circles x417m   Other Pulley Exercises seated for cane ER x2 min AAROM    Electrical Stimulation   Electrical Stimulation Location Left  shoulder/UT.   Electrical Stimulation Action premod   Electrical Stimulation Parameters 80-'150hz'$    Electrical Stimulation Goals Pain                  PT Short Term Goals - 06/09/16 1153    PT SHORT TERM GOAL #1   Title Independent with an initial HEP.   Time 2   Period Weeks   Status Achieved           PT Long Term Goals - 06/09/16 1154    PT LONG TERM GOAL #1   Title Independent with an advanced HEP.   Time 8   Period Weeks   Status On-going   PT LONG TERM GOAL #2   Title Active left shoulder flexion to 145 degrees so the patient can easily reach overhead   Time 8   Period Weeks   Status On-going   PT LONG TERM GOAL #3   Title Active ER to 70 degrees+ to allow for easily  donning/doffing of apparel   Time 8   Period Weeks   Status On-going   PT LONG TERM GOAL #4   Title Perform ADL's wiht pain not > 3-4/10.   Time 8   Period Weeks   Status On-going   PT LONG TERM GOAL #5   Title Perform 20 minutes of sustained aerobic activity with an 02 sat not below 93%.   Time 8   Period Weeks   Status On-going               Plan - 06/11/16 1147    Clinical Impression Statement Patient progressing slowly due to feeling overall fatigue. Patient required rest breaks today and slow pace. Patient O2 was at 92% with nustep after 48mn today yet was able to increase back to 95% after rest. Patient improved with AROM in left shoulder today. Patient tolerated treatment well today with rest breaks. Patient goals ongoing at this time due to ROM and endurance activitiy deficits.   Rehab Potential Good   PT Frequency 2x / week   PT Duration 8 weeks   PT Treatment/Interventions ADLs/Self Care Home Management;Electrical Stimulation;Moist Heat;Therapeutic exercise;Therapeutic activities;Gait training;Neuromuscular re-education;Patient/family education;Manual techniques;Passive range of motion   PT Next Visit Plan Cont with POC for shoulder ROM and overall deconditioning per MPT/ monitor O2 and No ultrasound.   Consulted and Agree with Plan of Care Patient      Patient will benefit from skilled therapeutic intervention in order to improve the following deficits and impairments:  Abnormal gait, Decreased activity tolerance, Decreased range of motion, Decreased strength, Pain, Decreased endurance  Visit Diagnosis: Muscle weakness (generalized)  Pain in left shoulder  Stiffness of left shoulder, not elsewhere classified     Problem List Patient Active Problem List   Diagnosis Date Noted  . Hallucination 09/27/2015  . Antineoplastic chemotherapy induced anemia 09/26/2015  . Anemia of chronic disease 09/26/2015  . Dyslipidemia associated with type 2 diabetes  mellitus ([place] 09/26/2015  . Type 2 diabetes, controlled, 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[place] 09/26/2015  . Polypharmacy 09/25/2015  . Acute encephalopathy 09/25/2015  . Recurrent falls 09/25/2015  . Pulmonary embolism ([place] 01/06/2015  . Enteritis due to Clostridium difficile 12/19/2014  . Protein-calorie malnutrition, severe ([place] 11/30/2014  . 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 11/30/2014  . Bleeding duodenal ulcer s/p ex lap/oversew 11/30/2014 11/30/2014  . Hypokalemia 10/25/2014  . Lung cancer ([place] 07/28/2014  . 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 and  BI 50% obst  07/20/2014  . COPD mixed type ([place]) 06/28/2014    DUNFORD, CHRISTINA P, PTA 06/11/2016, 12:03 PM  [facility] [place], [place], [address] Phone: [phone]   Fax:  [phone]  Name: Daryl Vega MRN: [id number] Date of Birth: 05-23-1946

## 2016-06-18 ENCOUNTER — Ambulatory Visit: Payer: Medicare HMO | Attending: Pain Medicine | Admitting: Physical Therapy

## 2016-06-18 DIAGNOSIS — M6281 Muscle weakness (generalized): Secondary | ICD-10-CM | POA: Diagnosis present

## 2016-06-18 DIAGNOSIS — M25512 Pain in left shoulder: Secondary | ICD-10-CM | POA: Diagnosis present

## 2016-06-18 DIAGNOSIS — M25612 Stiffness of left shoulder, not elsewhere classified: Secondary | ICD-10-CM | POA: Diagnosis present

## 2016-06-18 NOTE — Therapy (Addendum)
[facility] [place], [place], [address] Phone: [phone]   Fax:  [phone]  Physical Therapy Treatment  Patient Details  Name: Daryl Vega MRN: [id number] Date of Birth: [birth year] Referring Provider: Johnny Bridge MD.  Encounter Date: 06/18/2016      PT End of Session - 06/18/16 1254    Visit Number 5   Number of Visits 16   Date for PT Re-Evaluation 08/01/16   PT Start Time 1115   PT Stop Time 1203   PT Time Calculation (min) 48 min      Past Medical History  Diagnosis Date  . DM (diabetes mellitus) ([place])   . Pneumonia   . Hypercholesteremia   . Melanoma ([place])   . Radiation 08/03/14-08/23/14    35 gray to right chest  . Lung cancer ([place])   . FH: chemotherapy   . Gastroparesis 02/09/2015  . Cholecystoduodenal fistula   . Pulmonary embolus ([place])   . Duodenal ulcer   . COPD (chronic obstructive pulmonary disease) ([place])   . Protein calorie malnutrition ([place])   . Elevated LFTs   . C. difficile enteritis   . Anemia     Past Surgical History  Procedure Laterality Date  . Video bronchoscopy Bilateral 07/20/2014    Procedure: VIDEO BRONCHOSCOPY WITHOUT FLUORO;  Surgeon: Tanda Rockers, MD;  [facility];  Service: Cardiopulmonary;  Laterality: Bilateral;  . Esophagogastroduodenoscopy N/A 11/30/2014    Procedure: ESOPHAGOGASTRODUODENOSCOPY (EGD);  Surgeon: [name], MD;  Location: [facility];  Service: Endoscopy;  Laterality: N/A;  . Laparotomy N/A 11/30/2014    Procedure: EXPLORATORY LAPAROTOMY, PYLOROPLASTY, OVERSEWING OF POSTERIOR DUODENUM, DUODENOSTOMY, CHOLECYSTECTOMY, JEJEUNOSTOMY;  Surgeon: Jackolyn Confer, MD;  [facility];  Service: General;  Laterality: N/A;  . Pyloroplasty    . Cholecystectomy    . Duodenostomy tube    . Jejunostomy feeding tube      There were no vitals filed for this visit.      Subjective Assessment - 06/18/16 1254    Subjective I got about 8 shots  across my shoulders last week.   Limitations Walking   How long can you walk comfortably? Short distances.   Pain Score 6    Pain Location Shoulder   Pain Orientation Left   Pain Descriptors / Indicators Aching   Pain Type Chronic pain   Pain Onset More than a month ago                         [facility] Adult PT Treatment/Exercise - 06/18/16 0001    Exercises   Exercises Knee/Hip   Lumbar Exercises: Aerobic   Stationary Bike Nustep level 3 x 15 minutes.  02 sat= 94%.   Acupuncturist Location --  Left shoulder.   Electrical Stimulation Action Pre-mod.   Electrical Stimulation Parameters 80-150 Hz (5 sec on and 5 sec off) x 15 minutes.   Electrical Stimulation Goals Pain   Manual Therapy   Manual therapy comments STW/M to left shoulder posterior cuff region and left UT x 8 minutes.                  PT Short Term Goals - 06/09/16 1153    PT SHORT TERM GOAL #1   Title Independent with an initial HEP.   Time 2   Period Weeks   Status Achieved           PT Long Term  Goals - 06/09/16 1154    PT LONG TERM GOAL #1   Title Independent with an advanced HEP.   Time 8   Period Weeks   Status On-going   PT LONG TERM GOAL #2   Title Active left shoulder flexion to 145 degrees so the patient can easily reach overhead   Time 8   Period Weeks   Status On-going   PT LONG TERM GOAL #3   Title Active ER to 70 degrees+ to allow for easily donning/doffing of apparel   Time 8   Period Weeks   Status On-going   PT LONG TERM GOAL #4   Title Perform ADL's wiht pain not > 3-4/10.   Time 8   Period Weeks   Status On-going   PT LONG TERM GOAL #5   Title Perform 20 minutes of sustained aerobic activity with an 02 sat not below 93%.   Time 8   Period Weeks   Status On-going               Plan - 06/18/16 1301    Clinical Impression Statement CC continues to be that of left sided neck and left shoulder pain.       Patient will benefit from skilled therapeutic intervention in order to improve the following deficits and impairments:  Abnormal gait, Decreased activity tolerance, Decreased range of motion, Decreased strength, Pain, Decreased endurance  Visit Diagnosis: Muscle weakness (generalized)  Pain in left shoulder  Stiffness of left shoulder, not elsewhere classified     Problem List Patient Active Problem List   Diagnosis Date Noted  . Hallucination 09/27/2015  . Antineoplastic chemotherapy induced anemia 09/26/2015  . Anemia of chronic disease 09/26/2015  . Dyslipidemia associated with type 2 diabetes mellitus ([place]) 09/26/2015  . Type 2 diabetes, controlled, 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[place]) 09/26/2015  . Polypharmacy 09/25/2015  . Acute encephalopathy 09/25/2015  . Recurrent falls 09/25/2015  . Pulmonary embolism ([place]) 01/06/2015  . Enteritis due to Clostridium difficile 12/19/2014  . Protein-calorie malnutrition, severe ([place]) 11/30/2014  . 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 11/30/2014  . Bleeding duodenal ulcer s/p ex lap/oversew 11/30/2014 11/30/2014  . Hypokalemia 10/25/2014  . Lung cancer ([place]) 07/28/2014  . 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 COPD mixed type ([place]) 06/28/2014    Anyia Gierke, [name] MPT 06/18/2016, 1:02 PM  [facility] [place], [place], [address] Phone: [phone]   Fax:  [phone]  Name: Daryl Vega MRN: [id number] Date of Birth: 1945/12/23

## 2016-06-18 NOTE — Therapy (Signed)
[facility] [place], [place], [address] Phone: [phone]   Fax:  [phone]  Physical Therapy Treatment  Patient Details  Name: Daryl Vega MRN: [id number] Date of Birth: 1946-04-18 Referring Provider: Johnny Bridge MD.  Encounter Date: 06/18/2016      PT End of Session - 06/18/16 1254    Visit Number 5   Number of Visits 16   Date for PT Re-Evaluation 08/01/16   PT Start Time 1115   PT Stop Time 1203   PT Time Calculation (min) 48 min      Past Medical History  Diagnosis Date  . DM (diabetes mellitus) ([place])   . Pneumonia   . Hypercholesteremia   . Melanoma ([place])   . Radiation 08/03/14-08/23/14    35 gray to right chest  . Lung cancer ([place])   . FH: chemotherapy   . Gastroparesis 02/09/2015  . Cholecystoduodenal fistula   . Pulmonary embolus ([place])   . Duodenal ulcer   . COPD (chronic obstructive pulmonary disease) ([place])   . Protein calorie malnutrition ([place])   . Elevated LFTs   . C. difficile enteritis   . Anemia     Past Surgical History  Procedure Laterality Date  . Video bronchoscopy Bilateral 07/20/2014    Procedure: VIDEO BRONCHOSCOPY WITHOUT FLUORO;  Surgeon: Tanda Rockers, MD;  [facility];  Service: Cardiopulmonary;  Laterality: Bilateral;  . Esophagogastroduodenoscopy N/A 11/30/2014    Procedure: ESOPHAGOGASTRODUODENOSCOPY (EGD);  Surgeon: [name], MD;  Location: [facility];  Service: Endoscopy;  Laterality: N/A;  . Laparotomy N/A 11/30/2014    Procedure: EXPLORATORY LAPAROTOMY, PYLOROPLASTY, OVERSEWING OF POSTERIOR DUODENUM, DUODENOSTOMY, CHOLECYSTECTOMY, JEJEUNOSTOMY;  Surgeon: Jackolyn Confer, MD;  [facility];  Service: General;  Laterality: N/A;  . Pyloroplasty    . Cholecystectomy    . Duodenostomy tube    . Jejunostomy feeding tube      There were no vitals filed for this visit.      Subjective Assessment - 06/18/16 1254    Subjective I got about 8 shots  across my shoulders last week.   Limitations Walking   How long can you walk comfortably? Short distances.   Pain Score 6    Pain Location Shoulder   Pain Orientation Left   Pain Descriptors / Indicators Aching   Pain Type Chronic pain   Pain Onset More than a month ago                         [facility] Adult PT Treatment/Exercise - 06/18/16 0001    Exercises   Exercises Knee/Hip   Lumbar Exercises: Aerobic   Stationary Bike Nustep level 3 x 15 minutes.  02 sat= 94%.   Acupuncturist Location --  Left shoulder.   Electrical Stimulation Action Pre-mod.   Electrical Stimulation Parameters 80-150 Hz (5 sec on and 5 sec off) x 15 minutes.   Electrical Stimulation Goals Pain   Manual Therapy   Manual therapy comments STW/M to left shoulder posterior cuff region and left UT x 8 minutes.                  PT Short Term Goals - 06/09/16 1153    PT SHORT TERM GOAL #1   Title Independent with an initial HEP.   Time 2   Period Weeks   Status Achieved           PT Long Term  Goals - 06/09/16 1154    PT LONG TERM GOAL #1   Title Independent with an advanced HEP.   Time 8   Period Weeks   Status On-going   PT LONG TERM GOAL #2   Title Active left shoulder flexion to 145 degrees so the patient can easily reach overhead   Time 8   Period Weeks   Status On-going   PT LONG TERM GOAL #3   Title Active ER to 70 degrees+ to allow for easily donning/doffing of apparel   Time 8   Period Weeks   Status On-going   PT LONG TERM GOAL #4   Title Perform ADL's wiht pain not > 3-4/10.   Time 8   Period Weeks   Status On-going   PT LONG TERM GOAL #5   Title Perform 20 minutes of sustained aerobic activity with an 02 sat not below 93%.   Time 8   Period Weeks   Status On-going               Plan - 06/18/16 1301    Clinical Impression Statement CC continues to be that of left sided neck and left shoulder pain.       Patient will benefit from skilled therapeutic intervention in order to improve the following deficits and impairments:  Abnormal gait, Decreased activity tolerance, Decreased range of motion, Decreased strength, Pain, Decreased endurance  Visit Diagnosis: Muscle weakness (generalized)  Pain in left shoulder  Stiffness of left shoulder, not elsewhere classified     Problem List Patient Active Problem List   Diagnosis Date Noted  . Hallucination 09/27/2015  . Antineoplastic chemotherapy induced anemia 09/26/2015  . Anemia of chronic disease 09/26/2015  . Dyslipidemia associated with type 2 diabetes mellitus ([place]) 09/26/2015  . Type 2 diabetes, controlled, 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[place]) 09/26/2015  . Polypharmacy 09/25/2015  . Acute encephalopathy 09/25/2015  . Recurrent falls 09/25/2015  . Pulmonary embolism ([place]) 01/06/2015  . Enteritis due to Clostridium difficile 12/19/2014  . Protein-calorie malnutrition, severe ([place]) 11/30/2014  . 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 11/30/2014  . Bleeding duodenal ulcer s/p ex lap/oversew 11/30/2014 11/30/2014  . Hypokalemia 10/25/2014  . Lung cancer ([place]) 07/28/2014  . 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 COPD mixed type ([place]) 06/28/2014    Daryl Vega, Daryl Vega 06/18/2016, 3:45 PM  [facility] [address] [place], [place], [address] Phone: [phone]   Fax:  [phone]  Name: Daryl Vega MRN: [id number] Date of Birth: 07/17/1946

## 2016-06-23 ENCOUNTER — Ambulatory Visit: Payer: Medicare HMO | Admitting: Physical Therapy

## 2016-06-23 DIAGNOSIS — M25512 Pain in left shoulder: Secondary | ICD-10-CM

## 2016-06-23 DIAGNOSIS — M6281 Muscle weakness (generalized): Secondary | ICD-10-CM

## 2016-06-23 DIAGNOSIS — M25612 Stiffness of left shoulder, not elsewhere classified: Secondary | ICD-10-CM

## 2016-06-24 NOTE — Therapy (Signed)
[facility] [place], [place], [address] Phone: [phone]   Fax:  [phone]  Physical Therapy Treatment  Patient Details  Name: Daryl Vega MRN: [id number] Date of Birth: [birth year] Referring Provider: Johnny Bridge MD.  Encounter Date: 06/23/2016      PT End of Session - 06/23/16 1229    Visit Number 6   Number of Visits 16   Date for PT Re-Evaluation 08/01/16   PT Start Time 1115   PT Stop Time 1212   PT Time Calculation (min) 57 min   Activity Tolerance Patient tolerated treatment well;Patient limited by fatigue   Behavior During Therapy [facility] for tasks assessed/performed      Past Medical History  Diagnosis Date  . DM (diabetes mellitus) ([place])   . Pneumonia   . Hypercholesteremia   . Melanoma ([place])   . Radiation 08/03/14-08/23/14    35 gray to right chest  . Lung cancer ([place])   . FH: chemotherapy   . Gastroparesis 02/09/2015  . Cholecystoduodenal fistula   . Pulmonary embolus ([place])   . Duodenal ulcer   . COPD (chronic obstructive pulmonary disease) ([place])   . Protein calorie malnutrition ([place])   . Elevated LFTs   . C. difficile enteritis   . Anemia     Past Surgical History  Procedure Laterality Date  . Video bronchoscopy Bilateral 07/20/2014    Procedure: VIDEO BRONCHOSCOPY WITHOUT FLUORO;  Surgeon: Tanda Rockers, MD;  [facility];  Service: Cardiopulmonary;  Laterality: Bilateral;  . Esophagogastroduodenoscopy N/A 11/30/2014    Procedure: ESOPHAGOGASTRODUODENOSCOPY (EGD);  Surgeon: [name], MD;  Location: [facility];  Service: Endoscopy;  Laterality: N/A;  . Laparotomy N/A 11/30/2014    Procedure: EXPLORATORY LAPAROTOMY, PYLOROPLASTY, OVERSEWING OF POSTERIOR DUODENUM, DUODENOSTOMY, CHOLECYSTECTOMY, JEJEUNOSTOMY;  Surgeon: Jackolyn Confer, MD;  [facility];  Service: General;  Laterality: N/A;  . Pyloroplasty    . Cholecystectomy    . Duodenostomy tube    . Jejunostomy  feeding tube      There were no vitals filed for this visit.      Subjective Assessment - 06/23/16 1413    Subjective I was sore Saturday.  I drove more Friday than I have in the last 6 months.   Limitations Walking   How long can you walk comfortably? Short distances.       Treatment;  Nustep x 15 minutes; Pulleys x 8 minutes and seated Ranger x 6 minutes.  IFC x 15 minutes to patient's left UT/shoulder region.                            PT Short Term Goals - 06/09/16 1153    PT SHORT TERM GOAL #1   Title Independent with an initial HEP.   Time 2   Period Weeks   Status Achieved           PT Long Term Goals - 06/09/16 1154    PT LONG TERM GOAL #1   Title Independent with an advanced HEP.   Time 8   Period Weeks   Status On-going   PT LONG TERM GOAL #2   Title Active left shoulder flexion to 145 degrees so the patient can easily reach overhead   Time 8   Period Weeks   Status On-going   PT LONG TERM GOAL #3   Title Active ER to 70 degrees+ to allow for easily donning/doffing of apparel  Time 8   Period Weeks   Status On-going   PT LONG TERM GOAL #4   Title Perform ADL's wiht pain not > 3-4/10.   Time 8   Period Weeks   Status On-going   PT LONG TERM GOAL #5   Title Perform 20 minutes of sustained aerobic activity with an 02 sat not below 93%.   Time 8   Period Weeks   Status On-going             Patient will benefit from skilled therapeutic intervention in order to improve the following deficits and impairments:     Visit Diagnosis: Muscle weakness (generalized)  Pain in left shoulder  Stiffness of left shoulder, not elsewhere classified     Problem List Patient Active Problem List   Diagnosis Date Noted  . Hallucination 09/27/2015  . Antineoplastic chemotherapy induced anemia 09/26/2015  . Anemia of chronic disease 09/26/2015  . Dyslipidemia associated with type 2 diabetes mellitus ([place]) 09/26/2015  . Type 2  diabetes, controlled, 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[place]) 09/26/2015  . Polypharmacy 09/25/2015  . Acute encephalopathy 09/25/2015  . Recurrent falls 09/25/2015  . Pulmonary embolism ([place]) 01/06/2015  . Enteritis due to Clostridium difficile 12/19/2014  . Protein-calorie malnutrition, severe ([place]) 11/30/2014  . 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 11/30/2014  . Bleeding duodenal ulcer s/p ex lap/oversew 11/30/2014 11/30/2014  . Hypokalemia 10/25/2014  . Lung cancer ([place]) 07/28/2014  . 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 COPD mixed type ([place]) 06/28/2014    Doneta Bayman, [name] MPT 06/24/2016, 12:30 PM  [facility] [address] [place], [place], [address] Phone: [phone]   Fax:  [phone]  Name: Alvar Malinoski MRN: [id number] Date of Birth: 28-Aug-1946

## 2016-06-25 ENCOUNTER — Encounter: Payer: Self-pay | Admitting: Physical Therapy

## 2016-06-26 ENCOUNTER — Ambulatory Visit: Payer: Medicare HMO | Admitting: Physical Therapy

## 2016-06-26 ENCOUNTER — Encounter: Payer: Self-pay | Admitting: Physical Therapy

## 2016-06-26 DIAGNOSIS — M6281 Muscle weakness (generalized): Secondary | ICD-10-CM

## 2016-06-26 DIAGNOSIS — M25512 Pain in left shoulder: Secondary | ICD-10-CM

## 2016-06-26 DIAGNOSIS — M25612 Stiffness of left shoulder, not elsewhere classified: Secondary | ICD-10-CM

## 2016-06-26 NOTE — Therapy (Signed)
[facility] [place], [place], [address] Phone: [phone]   Fax:  [phone]  Physical Therapy Treatment  Patient Details  Name: Daryl Vega MRN: [id number] Date of Birth: 1945/12/25 Referring Provider: Johnny Bridge MD.  Encounter Date: 06/26/2016      PT End of Session - 06/26/16 1338    Visit Number 7   Number of Visits 16   Date for PT Re-Evaluation 08/01/16   PT Start Time 7867   PT Stop Time 1413   PT Time Calculation (min) 50 min   Activity Tolerance Patient limited by fatigue;Patient limited by pain;Patient tolerated treatment well   Behavior During Therapy [facility] for tasks assessed/performed      Past Medical History  Diagnosis Date  . DM (diabetes mellitus) ([place])   . Pneumonia   . Hypercholesteremia   . Melanoma ([place])   . Radiation 08/03/14-08/23/14    35 gray to right chest  . Lung cancer ([place])   . FH: chemotherapy   . Gastroparesis 02/09/2015  . Cholecystoduodenal fistula   . Pulmonary embolus ([place])   . Duodenal ulcer   . COPD (chronic obstructive pulmonary disease) ([place])   . Protein calorie malnutrition ([place])   . Elevated LFTs   . C. difficile enteritis   . Anemia     Past Surgical History  Procedure Laterality Date  . Video bronchoscopy Bilateral 07/20/2014    Procedure: VIDEO BRONCHOSCOPY WITHOUT FLUORO;  Surgeon: Tanda Rockers, MD;  Location: [facility];  Service: Cardiopulmonary;  Laterality: Bilateral;  . Esophagogastroduodenoscopy N/A 11/30/2014    Procedure: ESOPHAGOGASTRODUODENOSCOPY (EGD);  Surgeon: [name], MD;  Location: [facility];  Service: Endoscopy;  Laterality: N/A;  . Laparotomy N/A 11/30/2014    Procedure: EXPLORATORY LAPAROTOMY, PYLOROPLASTY, OVERSEWING OF POSTERIOR DUODENUM, DUODENOSTOMY, CHOLECYSTECTOMY, JEJEUNOSTOMY;  Surgeon: Jackolyn Confer, MD;  Location: [facility];  Service: General;  Laterality: N/A;  . Pyloroplasty    . Cholecystectomy    . Duodenostomy  tube    . Jejunostomy feeding tube      Filed Vitals:   06/26/16 1324  SpO2: 94%        Subjective Assessment - 06/26/16 1324    Subjective Very sore after have a procedure called an eight pack in neck which include 8 needles into nerves to reduce pain per patient explanation   Limitations Walking   How long can you walk comfortably? Short distances.   Currently in Pain? Yes   Pain Score 7    Pain Location Shoulder   Pain Orientation Left   Pain Descriptors / Indicators Aching   Pain Type Chronic pain   Pain Onset More than a month ago   Pain Frequency Constant   Aggravating Factors  any prolong activity   Pain Relieving Factors nothing reported at this time                         [facility] Adult PT Treatment/Exercise - 06/26/16 0001    Knee/Hip Exercises: Aerobic   Nustep L2 x15 O2 94% pre and 95% post, monitored throughout   Shoulder Exercises: Pulleys   Flexion 2 minutes   Electrical Stimulation   Electrical Stimulation Location Left shoulder/UT   Electrical Stimulation Action IFC   Electrical Stimulation Parameters 80-'150hz'$    Electrical Stimulation Goals Pain   Manual Therapy   Manual therapy comments STW/M to left shoulder posterior cuff region and left UT  PT Short Term Goals - 06/09/16 1153    PT SHORT TERM GOAL #1   Title Independent with an initial HEP.   Time 2   Period Weeks   Status Achieved           PT Long Term Goals - 06/09/16 1154    PT LONG TERM GOAL #1   Title Independent with an advanced HEP.   Time 8   Period Weeks   Status On-going   PT LONG TERM GOAL #2   Title Active left shoulder flexion to 145 degrees so the patient can easily reach overhead   Time 8   Period Weeks   Status On-going   PT LONG TERM GOAL #3   Title Active ER to 70 degrees+ to allow for easily donning/doffing of apparel   Time 8   Period Weeks   Status On-going   PT LONG TERM GOAL #4   Title Perform ADL's wiht pain  not > 3-4/10.   Time 8   Period Weeks   Status On-going   PT LONG TERM GOAL #5   Title Perform 20 minutes of sustained aerobic activity with an 02 sat not below 93%.   Time 8   Period Weeks   Status On-going               Plan - 06/26/16 1339    Clinical Impression Statement Patient progressing slowly this wek due to ongoing pain. Patient is very sore after a medical procedure for neck. Patient was able to complete his 15 min on nustep with improved endurance tolerance yet required a few rest breaks. Patient was able to keep O2 range at 94% and above to 95%. Patient is very motivated and able to perform exercises with good pace. Goals ongoing at this time due to pain and ROM deficts   Rehab Potential Good   PT Frequency 2x / week   PT Duration 8 weeks   PT Treatment/Interventions ADLs/Self Care Home Management;Electrical Stimulation;Moist Heat;Therapeutic exercise;Therapeutic activities;Gait training;Neuromuscular re-education;Patient/family education;Manual techniques;Passive range of motion   PT Next Visit Plan Cont with POC for shoulder ROM and overall deconditioning per MPT/ monitor O2 and No ultrasound.   Consulted and Agree with Plan of Care Patient      Patient will benefit from skilled therapeutic intervention in order to improve the following deficits and impairments:  Abnormal gait, Decreased activity tolerance, Decreased range of motion, Decreased strength, Pain, Decreased endurance  Visit Diagnosis: Muscle weakness (generalized)  Pain in left shoulder  Stiffness of left shoulder, not elsewhere classified     Problem List Patient Active Problem List   Diagnosis Date Noted  . Hallucination 09/27/2015  . Antineoplastic chemotherapy induced anemia 09/26/2015  . Anemia of chronic disease 09/26/2015  . Dyslipidemia associated with type 2 diabetes mellitus ([place]) 09/26/2015  . Type 2 diabetes, controlled, 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[place]) 09/26/2015  .  Polypharmacy 09/25/2015  . Acute encephalopathy 09/25/2015  . Recurrent falls 09/25/2015  . Pulmonary embolism ([place]) 01/06/2015  . Enteritis due to Clostridium difficile 12/19/2014  . Protein-calorie malnutrition, severe ([place]) 11/30/2014  . 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 11/30/2014  . Bleeding duodenal ulcer s/p ex lap/oversew 11/30/2014 11/30/2014  . Hypokalemia 10/25/2014  . Lung cancer ([place]) 07/28/2014  . 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 COPD mixed type ([place]) 06/28/2014    Zohaib Heeney P, PTA 06/26/2016, 2:13 PM  [facility] [place], [place], [address] Phone: [phone]   Fax:  [phone]  Name: Garrie Woodin MRN: [id number] Date of Birth: 23-Oct-1946

## 2016-06-30 ENCOUNTER — Ambulatory Visit: Payer: Medicare HMO | Admitting: Physical Therapy

## 2016-06-30 ENCOUNTER — Encounter: Payer: Self-pay | Admitting: Physical Therapy

## 2016-06-30 DIAGNOSIS — M25512 Pain in left shoulder: Secondary | ICD-10-CM

## 2016-06-30 DIAGNOSIS — M6281 Muscle weakness (generalized): Secondary | ICD-10-CM | POA: Diagnosis not present

## 2016-06-30 DIAGNOSIS — M25612 Stiffness of left shoulder, not elsewhere classified: Secondary | ICD-10-CM

## 2016-06-30 NOTE — Therapy (Signed)
[facility] [place], [place], [address] Phone: [phone]   Fax:  [phone]  Physical Therapy Treatment  Patient Details  Name: Daryl Vega MRN: [id number] Date of Birth: [birth year] Referring Provider: Johnny Bridge MD.  Encounter Date: 06/30/2016      PT End of Session - 06/30/16 1147    Visit Number 8   Number of Visits 16   Date for PT Re-Evaluation 08/01/16   PT Start Time 1115   PT Stop Time 1201   PT Time Calculation (min) 46 min   Activity Tolerance Patient limited by fatigue;Patient limited by pain;Patient tolerated treatment well   Behavior During Therapy [facility] for tasks assessed/performed      Past Medical History  Diagnosis Date  . DM (diabetes mellitus) ([place])   . Pneumonia   . Hypercholesteremia   . Melanoma ([place])   . Radiation 08/03/14-08/23/14    35 gray to right chest  . Lung cancer ([place])   . FH: chemotherapy   . Gastroparesis 02/09/2015  . Cholecystoduodenal fistula   . Pulmonary embolus ([place])   . Duodenal ulcer   . COPD (chronic obstructive pulmonary disease) ([place])   . Protein calorie malnutrition ([place])   . Elevated LFTs   . C. difficile enteritis   . Anemia     Past Surgical History  Procedure Laterality Date  . Video bronchoscopy Bilateral 07/20/2014    Procedure: VIDEO BRONCHOSCOPY WITHOUT FLUORO;  Surgeon: Tanda Rockers, MD;  [facility]: [facility];  Service: Cardiopulmonary;  Laterality: Bilateral;  . Esophagogastroduodenoscopy N/A 11/30/2014    Procedure: ESOPHAGOGASTRODUODENOSCOPY (EGD);  Surgeon: [name], MD;  Location: [facility];  Service: Endoscopy;  Laterality: N/A;  . Laparotomy N/A 11/30/2014    Procedure: EXPLORATORY LAPAROTOMY, PYLOROPLASTY, OVERSEWING OF POSTERIOR DUODENUM, DUODENOSTOMY, CHOLECYSTECTOMY, JEJEUNOSTOMY;  Surgeon: Jackolyn Confer, MD;  Location: [facility];  Service: General;  Laterality: N/A;  . Pyloroplasty    . Cholecystectomy    . Duodenostomy  tube    . Jejunostomy feeding tube      Filed Vitals:   06/30/16 1129  SpO2: 96%        Subjective Assessment - 06/30/16 1129    Subjective Patient reported fatigue upon arrival   Limitations Walking   How long can you walk comfortably? Short distances.   Currently in Pain? Yes   Pain Score 8    Pain Location Shoulder   Pain Orientation Left   Pain Descriptors / Indicators Aching   Pain Type Chronic pain   Pain Onset More than a month ago   Pain Frequency Constant   Aggravating Factors  any prolong activity   Pain Relieving Factors nothing reported                          [facility] Adult PT Treatment/Exercise - 06/30/16 0001    Knee/Hip Exercises: Aerobic   Nustep L2 x20 O2 96% pre and 96% post, monitored throughout, and it took over 20 min due to rest breaks   Shoulder Exercises: Pulleys   Flexion --  9mn   Modalities   Modalities Moist Heat   Moist Heat Therapy   Number Minutes Moist Heat 15 Minutes   Moist Heat Location Shoulder   Electrical Stimulation   Electrical Stimulation Location Left shoulder/UT   Electrical Stimulation Action IFC   Electrical Stimulation Parameters 80-'150hz'$    Electrical Stimulation Goals Pain  PT Short Term Goals - 06/09/16 1153    PT SHORT TERM GOAL #1   Title Independent with an initial HEP.   Time 2   Period Weeks   Status Achieved           PT Long Term Goals - 06/30/16 1148    PT LONG TERM GOAL #1   Title Independent with an advanced HEP.   Time 8   Period Weeks   Status On-going   PT LONG TERM GOAL #2   Title Active left shoulder flexion to 145 degrees so the patient can easily reach overhead   Time 8   Period Weeks   PT LONG TERM GOAL #3   Title Active ER to 70 degrees+ to allow for easily donning/doffing of apparel   Time 8   Period Weeks   Status On-going   PT LONG TERM GOAL #4   Title Perform ADL's wiht pain not > 3-4/10.   Time 8   Period Weeks   PT LONG TERM  GOAL #5   Title Perform 20 minutes of sustained aerobic activity with an 02 sat not below 93%.   Time 8   Period Weeks   Status On-going  patient able to tolerate 20 min with O2 @ 96% today throughout yet required rest breaks 717/17               Plan - 06/30/16 1151    Clinical Impression Statement Patient continues to progress with endurance tolerance and improved O2 overall, yet unable to tolerate a lot of exercise with left shoulder due to high pain level. Patient able to improve nustep time and O2 today and progressing toward goal. Patient has had no relief for shoulder at this time. Patient goals ongoing due to pain and ROM deficits.   PT Frequency 2x / week   PT Duration 8 weeks   PT Treatment/Interventions ADLs/Self Care Home Management;Electrical Stimulation;Moist Heat;Therapeutic exercise;Therapeutic activities;Gait training;Neuromuscular re-education;Patient/family education;Manual techniques;Passive range of motion   PT Next Visit Plan Cont with POC for shoulder ROM and overall deconditioning per MPT/ monitor O2 and No ultrasound.   Consulted and Agree with Plan of Care Patient      Patient will benefit from skilled therapeutic intervention in order to improve the following deficits and impairments:  Abnormal gait, Decreased activity tolerance, Decreased range of motion, Decreased strength, Pain, Decreased endurance  Visit Diagnosis: Muscle weakness (generalized)  Pain in left shoulder  Stiffness of left shoulder, not elsewhere classified     Problem List Patient Active Problem List   Diagnosis Date Noted  . Hallucination 09/27/2015  . Antineoplastic chemotherapy induced anemia 09/26/2015  . Anemia of chronic disease 09/26/2015  . Dyslipidemia associated with type 2 diabetes mellitus ([place]) 09/26/2015  . Type 2 diabetes, controlled, 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[place]) 09/26/2015  . Polypharmacy 09/25/2015  . Acute encephalopathy 09/25/2015  . Recurrent falls  09/25/2015  . Pulmonary embolism ([place]) 01/06/2015  . Enteritis due to Clostridium difficile 12/19/2014  . Protein-calorie malnutrition, severe ([place]) 11/30/2014  . 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 11/30/2014  . Bleeding duodenal ulcer s/p ex lap/oversew 11/30/2014 11/30/2014  . Hypokalemia 10/25/2014  . Lung cancer ([place]) 07/28/2014  . 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 COPD mixed type ([place]) 06/28/2014    DUNFORD, CHRISTINA P, PTA 06/30/2016, 12:03 PM  [facility] [address] [place], [place], [address] Phone: [phone]  Fax:  [phone]  Name: Bao Bazen MRN: [id number] Date of Birth: 11-17-1946

## 2016-07-02 ENCOUNTER — Ambulatory Visit: Payer: Medicare HMO | Admitting: Physical Therapy

## 2016-07-02 DIAGNOSIS — M6281 Muscle weakness (generalized): Secondary | ICD-10-CM | POA: Diagnosis not present

## 2016-07-02 DIAGNOSIS — M25612 Stiffness of left shoulder, not elsewhere classified: Secondary | ICD-10-CM

## 2016-07-02 DIAGNOSIS — M25512 Pain in left shoulder: Secondary | ICD-10-CM

## 2016-07-02 NOTE — Therapy (Signed)
[facility] [place], [place], [address] Phone: [phone]   Fax:  [phone]  Physical Therapy Treatment  Patient Details  Name: Daryl Vega MRN: [id number] Date of Birth: 08/09/1946 Referring Provider: Johnny Bridge MD.  Encounter Date: 07/02/2016      PT End of Session - 07/02/16 1200    Visit Number 9   Number of Visits 16   Date for PT Re-Evaluation 08/01/16   PT Start Time 1115   PT Stop Time 1214   PT Time Calculation (min) 59 min   Activity Tolerance Patient tolerated treatment well   Behavior During Therapy [facility] for tasks assessed/performed      Past Medical History  Diagnosis Date  . DM (diabetes mellitus) ([place])   . Pneumonia   . Hypercholesteremia   . Melanoma ([place])   . Radiation 08/03/14-08/23/14    35 gray to right chest  . Lung cancer ([place])   . FH: chemotherapy   . Gastroparesis 02/09/2015  . Cholecystoduodenal fistula   . Pulmonary embolus ([place])   . Duodenal ulcer   . COPD (chronic obstructive pulmonary disease) ([place])   . Protein calorie malnutrition ([place])   . Elevated LFTs   . C. difficile enteritis   . Anemia     Past Surgical History  Procedure Laterality Date  . Video bronchoscopy Bilateral 07/20/2014    Procedure: VIDEO BRONCHOSCOPY WITHOUT FLUORO;  Surgeon: Tanda Rockers, MD;  [facility]: [facility];  Service: Cardiopulmonary;  Laterality: Bilateral;  . Esophagogastroduodenoscopy N/A 11/30/2014    Procedure: ESOPHAGOGASTRODUODENOSCOPY (EGD);  Surgeon: [name], MD;  Location: [facility];  Service: Endoscopy;  Laterality: N/A;  . Laparotomy N/A 11/30/2014    Procedure: EXPLORATORY LAPAROTOMY, PYLOROPLASTY, OVERSEWING OF POSTERIOR DUODENUM, DUODENOSTOMY, CHOLECYSTECTOMY, JEJEUNOSTOMY;  Surgeon: Jackolyn Confer, MD;  Location: [facility];  Service: General;  Laterality: N/A;  . Pyloroplasty    . Cholecystectomy    . Duodenostomy tube    . Jejunostomy feeding tube      There  were no vitals filed for this visit.      Subjective Assessment - 07/02/16 1201    Subjective Better day.   Pain Score 7    Pain Location Shoulder   Pain Orientation Left   Pain Descriptors / Indicators Aching   Pain Type Chronic pain   Pain Onset More than a month ago   Pain Frequency Constant     TREATMENT:  Nustep level 5 x 25 minutes f/n pulleys x 2 minutes...02 sat= 95% and HR= 100 BPM.  IFC at 80-150 Hz x 20 minutes to patient's affected left shoulder.  Excellent job today with endurance improving.                              PT Short Term Goals - 06/09/16 1153    PT SHORT TERM GOAL #1   Title Independent with an initial HEP.   Time 2   Period Weeks   Status Achieved           PT Long Term Goals - 06/30/16 1148    PT LONG TERM GOAL #1   Title Independent with an advanced HEP.   Time 8   Period Weeks   Status On-going   PT LONG TERM GOAL #2   Title Active left shoulder flexion to 145 degrees so the patient can easily reach overhead   Time 8   Period Weeks   PT  LONG TERM GOAL #3   Title Active ER to 70 degrees+ to allow for easily donning/doffing of apparel   Time 8   Period Weeks   Status On-going   PT LONG TERM GOAL #4   Title Perform ADL's wiht pain not > 3-4/10.   Time 8   Period Weeks   PT LONG TERM GOAL #5   Title Perform 20 minutes of sustained aerobic activity with an 02 sat not below 93%.   Time 8   Period Weeks   Status On-going  patient able to tolerate 20 min with O2 @ 96% today throughout yet required rest breaks 717/17               Plan - 07/02/16 1202    PT Treatment/Interventions ADLs/Self Care Home Management;Electrical Stimulation;Moist Heat;Therapeutic exercise;Therapeutic activities;Gait training;Neuromuscular re-education;Patient/family education;Manual techniques;Passive range of motion   PT Next Visit Plan Cont with POC for shoulder ROM and overall deconditioning per MPT/ monitor O2 and No  ultrasound.      Patient will benefit from skilled therapeutic intervention in order to improve the following deficits and impairments:  Abnormal gait, Decreased activity tolerance, Decreased range of motion, Decreased strength, Pain, Decreased endurance  Visit Diagnosis: Muscle weakness (generalized)  Pain in left shoulder  Stiffness of left shoulder, not elsewhere classified     Problem List Patient Active Problem List   Diagnosis Date Noted  . Hallucination 09/27/2015  . Antineoplastic chemotherapy induced anemia 09/26/2015  . Anemia of chronic disease 09/26/2015  . Dyslipidemia associated with type 2 diabetes mellitus ([place]) 09/26/2015  . Type 2 diabetes, controlled, 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[place]) 09/26/2015  . Polypharmacy 09/25/2015  . Acute encephalopathy 09/25/2015  . Recurrent falls 09/25/2015  . Pulmonary embolism ([place]) 01/06/2015  . Enteritis due to Clostridium difficile 12/19/2014  . Protein-calorie malnutrition, severe ([place]) 11/30/2014  . 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 11/30/2014  . Bleeding duodenal ulcer s/p ex lap/oversew 11/30/2014 11/30/2014  . Hypokalemia 10/25/2014  . Lung cancer ([place]) 07/28/2014  . 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 COPD mixed type ([place]) 06/28/2014    Willis Holquin, [name] MPT 07/02/2016, 12:15 PM  [facility] [place], [place], [address] Phone: [phone]   Fax:  [phone]  Name: Daryl Vega MRN: [id number] Date of Birth: [birth year]

## 2016-07-07 ENCOUNTER — Encounter: Payer: Self-pay | Admitting: Physical Therapy

## 2016-07-07 ENCOUNTER — Ambulatory Visit: Payer: Medicare HMO | Admitting: Physical Therapy

## 2016-07-07 VITALS — HR 88

## 2016-07-07 DIAGNOSIS — M25612 Stiffness of left shoulder, not elsewhere classified: Secondary | ICD-10-CM

## 2016-07-07 DIAGNOSIS — M6281 Muscle weakness (generalized): Secondary | ICD-10-CM

## 2016-07-07 DIAGNOSIS — M25512 Pain in left shoulder: Secondary | ICD-10-CM

## 2016-07-07 NOTE — Therapy (Signed)
[facility] [place], [place], [address] Phone: [phone]   Fax:  [phone]  Physical Therapy Treatment  Patient Details  Name: Daryl Vega MRN: [id number] Date of Birth: 1946/06/14 Referring Provider: Johnny Bridge MD.  Encounter Date: 07/07/2016      PT End of Session - 07/07/16 1112    Visit Number 10   Number of Visits 16   Date for PT Re-Evaluation 08/01/16   PT Start Time 1117   PT Stop Time 1206   PT Time Calculation (min) 49 min   Activity Tolerance Patient tolerated treatment well;Patient limited by pain   Behavior During Therapy [facility] for tasks assessed/performed      Past Medical History:  Diagnosis Date  . Anemia   . C. difficile enteritis   . Cholecystoduodenal fistula   . COPD (chronic obstructive pulmonary disease) ([place])   . DM (diabetes mellitus) ([place])   . Duodenal ulcer   . Elevated LFTs   . FH: chemotherapy   . Gastroparesis 02/09/2015  . Hypercholesteremia   . Lung cancer ([place])   . Melanoma ([place])   . Pneumonia   . Protein calorie malnutrition ([place])   . Pulmonary embolus ([place])   . Radiation 08/03/14-08/23/14   35 gray to right chest    Past Surgical History:  Procedure Laterality Date  . CHOLECYSTECTOMY    . duodenostomy tube    . ESOPHAGOGASTRODUODENOSCOPY N/A 11/30/2014   Procedure: ESOPHAGOGASTRODUODENOSCOPY (EGD);  Surgeon: [name], MD;  Location: [facility];  Service: Endoscopy;  Laterality: N/A;  . JEJUNOSTOMY FEEDING TUBE    . LAPAROTOMY N/A 11/30/2014   Procedure: EXPLORATORY LAPAROTOMY, PYLOROPLASTY, OVERSEWING OF POSTERIOR DUODENUM, DUODENOSTOMY, CHOLECYSTECTOMY, JEJEUNOSTOMY;  Surgeon: Jackolyn Confer, MD;  Location: [facility];  Service: General;  Laterality: N/A;  . PYLOROPLASTY    . VIDEO BRONCHOSCOPY Bilateral 07/20/2014   Procedure: VIDEO BRONCHOSCOPY WITHOUT FLUORO;  Surgeon: Tanda Rockers, MD;  Location: [facility];  Service: Cardiopulmonary;  Laterality:  Bilateral;    Vitals:   07/07/16 1112  Pulse: 88  SpO2: 98%        Subjective Assessment - 07/07/16 1112    Subjective Reports that his L shoulder is hurting him today. Reports that he hurts to get out of a chair with several attempts required at time. Patient reports that next step will be to "deaden the nerves in my neck."   Limitations Walking   How long can you walk comfortably? Short distances.   Currently in Pain? Yes   Pain Score 8    Pain Location Shoulder   Pain Orientation Left   Pain Descriptors / Indicators Sore   Pain Type Chronic pain   Pain Onset More than a month ago            [facility] PT Assessment - 07/07/16 0001      Assessment   Medical Diagnosis Deconditioning and left shoulder pain.     Precautions   Precaution Comments Supervised gait.  Please monitor 02 sat and HR.  No ultrasound.     Restrictions   Weight Bearing Restrictions No                     [facility] Adult PT Treatment/Exercise - 07/07/16 0001      Knee/Hip Exercises: Aerobic   Nustep L2 x20 minutes (after 15 min 98% o2 sat, 88 bpm; after 20 minutes 95% o2 sat, 111 bpm ) monitored continuously      Shoulder Exercises:  Pulleys   Flexion 3 minutes   Other Pulley Exercises Seated LUE ranger in add/abd, flexion x3 min     Modalities   Modalities Electrical Stimulation;Moist Heat     Moist Heat Therapy   Number Minutes Moist Heat 15 Minutes   Moist Heat Location Shoulder     Electrical Stimulation   Electrical Stimulation Location L shoulder/ UT   Electrical Stimulation Action IFC   Electrical Stimulation Parameters 1-10 hz x15 min   Electrical Stimulation Goals Pain                  PT Short Term Goals - 06/09/16 1153      PT SHORT TERM GOAL #1   Title Independent with an initial HEP.   Time 2   Period Weeks   Status Achieved           PT Long Term Goals - 07/07/16 1156      PT LONG TERM GOAL #1   Title Independent with an advanced HEP.    Time 8   Period Weeks   Status On-going     PT LONG TERM GOAL #2   Title Active left shoulder flexion to 145 degrees so the patient can easily reach overhead   Time 8   Period Weeks     PT LONG TERM GOAL #3   Title Active ER to 70 degrees+ to allow for easily donning/doffing of apparel   Time 8   Period Weeks   Status On-going     PT LONG TERM GOAL #4   Title Perform ADL's wiht pain not > 3-4/10.   Time 8   Period Weeks   Status On-going     PT LONG TERM GOAL #5   Title Perform 20 minutes of sustained aerobic activity with an 02 sat not below 93%.   Time 8   Period Weeks   Status Partially Met  Patient able to tolerate 20 min of NuStep better with O2 sat at 95% at its lowest with very small rest break required 07/07/2016               Plan - 07/07/16 1151    Clinical Impression Statement Patient tolerated today's treatment fairly well as he was able to tolerate longer period on NuStep without rest break. Limited with L shoulder exercise secondary to increased L shoulder pain today. Patient required short rest breaks with seated pulley and seated UE ranger. While on NuStep patient's O2 did not decrease below 95% and able to continue to 20 minutes total. Normal modalities response noted following removal of the modalities. Goals remain on-going at this time secondary to deficits with L shoulder ROM and continued L shoulder pain.   Rehab Potential Good   PT Frequency 2x / week   PT Duration 8 weeks   PT Treatment/Interventions ADLs/Self Care Home Management;Electrical Stimulation;Moist Heat;Therapeutic exercise;Therapeutic activities;Gait training;Neuromuscular re-education;Patient/family education;Manual techniques;Passive range of motion   PT Next Visit Plan Cont with POC for shoulder ROM and overall deconditioning per MPT/ monitor O2 and No ultrasound.   Consulted and Agree with Plan of Care Patient      Patient will benefit from skilled therapeutic intervention in  order to improve the following deficits and impairments:  Abnormal gait, Decreased activity tolerance, Decreased range of motion, Decreased strength, Pain, Decreased endurance  Visit Diagnosis: Muscle weakness (generalized)  Pain in left shoulder  Stiffness of left shoulder, not elsewhere classified     Problem List Patient Active Problem List  Diagnosis Date Noted  . Hallucination 09/27/2015  . Antineoplastic chemotherapy induced anemia 09/26/2015  . Anemia of chronic disease 09/26/2015  . Dyslipidemia associated with type 2 diabetes mellitus ([place]) 09/26/2015  . Type 2 diabetes, controlled, 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[place]) 09/26/2015  . Polypharmacy 09/25/2015  . Acute encephalopathy 09/25/2015  . Recurrent falls 09/25/2015  . Pulmonary embolism ([place]) 01/06/2015  . Enteritis due to Clostridium difficile 12/19/2014  . Protein-calorie malnutrition, severe ([place]) 11/30/2014  . 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 11/30/2014  . Bleeding duodenal ulcer s/p ex lap/oversew 11/30/2014 11/30/2014  . Hypokalemia 10/25/2014  . Lung cancer ([place]) 07/28/2014  . 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 COPD mixed type ([place]) 06/28/2014    Wynelle Fanny, PTA 07/07/2016, 12:09 PM  [facility] [address] [place], [place], [address] Phone: [phone]   Fax:  [phone]  Name: Daryl Vega MRN: [id number] Date of Birth: [birth year]

## 2016-07-09 ENCOUNTER — Ambulatory Visit: Payer: Medicare HMO | Admitting: Physical Therapy

## 2016-07-09 ENCOUNTER — Encounter: Payer: Self-pay | Admitting: Physical Therapy

## 2016-07-09 VITALS — HR 110

## 2016-07-09 DIAGNOSIS — M25512 Pain in left shoulder: Secondary | ICD-10-CM

## 2016-07-09 DIAGNOSIS — M6281 Muscle weakness (generalized): Secondary | ICD-10-CM | POA: Diagnosis not present

## 2016-07-09 DIAGNOSIS — M25612 Stiffness of left shoulder, not elsewhere classified: Secondary | ICD-10-CM

## 2016-07-09 NOTE — Therapy (Signed)
[facility] [place], [place], [address] Phone: [phone]   Fax:  [phone]  Physical Therapy Treatment  Patient Details  Name: Daryl Vega MRN: [id number] Date of Birth: 03-May-1946 Referring Provider: Johnny Bridge MD.  Encounter Date: 07/09/2016      PT End of Session - 07/09/16 1120    Visit Number 11   Number of Visits 16   Date for PT Re-Evaluation 08/01/16   PT Start Time 1118   PT Stop Time 1209   PT Time Calculation (min) 51 min   Activity Tolerance Patient tolerated treatment well;Patient limited by pain   Behavior During Therapy [facility] for tasks assessed/performed      Past Medical History:  Diagnosis Date  . Anemia   . C. difficile enteritis   . Cholecystoduodenal fistula   . COPD (chronic obstructive pulmonary disease) ([place])   . DM (diabetes mellitus) ([place])   . Duodenal ulcer   . Elevated LFTs   . FH: chemotherapy   . Gastroparesis 02/09/2015  . Hypercholesteremia   . Lung cancer ([place])   . Melanoma ([place])   . Pneumonia   . Protein calorie malnutrition ([place])   . Pulmonary embolus ([place])   . Radiation 08/03/14-08/23/14   35 gray to right chest    Past Surgical History:  Procedure Laterality Date  . CHOLECYSTECTOMY    . duodenostomy tube    . ESOPHAGOGASTRODUODENOSCOPY N/A 11/30/2014   Procedure: ESOPHAGOGASTRODUODENOSCOPY (EGD);  Surgeon: [name], MD;  Location: [facility];  Service: Endoscopy;  Laterality: N/A;  . JEJUNOSTOMY FEEDING TUBE    . LAPAROTOMY N/A 11/30/2014   Procedure: EXPLORATORY LAPAROTOMY, PYLOROPLASTY, OVERSEWING OF POSTERIOR DUODENUM, DUODENOSTOMY, CHOLECYSTECTOMY, JEJEUNOSTOMY;  Surgeon: Jackolyn Confer, MD;  Location: [facility];  Service: General;  Laterality: N/A;  . PYLOROPLASTY    . VIDEO BRONCHOSCOPY Bilateral 07/20/2014   Procedure: VIDEO BRONCHOSCOPY WITHOUT FLUORO;  Surgeon: Tanda Rockers, MD;  [facility];  Service: Cardiopulmonary;  Laterality:  Bilateral;    Vitals:   07/09/16 1133  Pulse: (!) 110  SpO2: 95%        Subjective Assessment - 07/09/16 1120    Subjective Reports that he woke up this morning with soreness in L shoulder but his shoulder feels pretty good right now.   Limitations Walking   How long can you walk comfortably? Short distances.   Currently in Pain? Yes   Pain Score 6    Pain Location Shoulder   Pain Orientation Left   Pain Descriptors / Indicators Sore   Pain Type Chronic pain   Pain Onset More than a month ago            [facility] PT Assessment - 07/09/16 0001      Assessment   Medical Diagnosis Deconditioning and left shoulder pain.     Precautions   Precaution Comments Supervised gait.  Please monitor 02 sat and HR.  No ultrasound.     Restrictions   Weight Bearing Restrictions No                     [facility] Adult PT Treatment/Exercise - 07/09/16 0001      Knee/Hip Exercises: Aerobic   Nustep L3 x20 min, monitored throughout  HR 123  , O2 sat 92% after 20 min     Shoulder Exercises: Seated   External Rotation Strengthening;Both;20 reps;Theraband   Theraband Level (Shoulder External Rotation) Level 1 (Yellow)   Other Seated Exercises Seated L shoulder  UE ranger flex, abduct/adduct, circles x 4 min     Shoulder Exercises: Pulleys   Flexion 3 minutes     Modalities   Modalities Electrical Stimulation;Moist Heat     Moist Heat Therapy   Number Minutes Moist Heat 15 Minutes   Moist Heat Location Shoulder     Electrical Stimulation   Electrical Stimulation Location L shoulder/ UT   Electrical Stimulation Action IFC   Electrical Stimulation Parameters 1-10 hz x15 min   Electrical Stimulation Goals Pain                  PT Short Term Goals - 06/09/16 1153      PT SHORT TERM GOAL #1   Title Independent with an initial HEP.   Time 2   Period Weeks   Status Achieved           PT Long Term Goals - 07/07/16 1156      PT LONG TERM GOAL #1    Title Independent with an advanced HEP.   Time 8   Period Weeks   Status On-going     PT LONG TERM GOAL #2   Title Active left shoulder flexion to 145 degrees so the patient can easily reach overhead   Time 8   Period Weeks     PT LONG TERM GOAL #3   Title Active ER to 70 degrees+ to allow for easily donning/doffing of apparel   Time 8   Period Weeks   Status On-going     PT LONG TERM GOAL #4   Title Perform ADL's wiht pain not > 3-4/10.   Time 8   Period Weeks   Status On-going     PT LONG TERM GOAL #5   Title Perform 20 minutes of sustained aerobic activity with an 02 sat not below 93%.   Time 8   Period Weeks   Status Partially Met  Patient able to tolerate 20 min of NuStep better with O2 sat at 95% at its lowest with very small rest break required 07/07/2016               Plan - 07/09/16 1153    Clinical Impression Statement Patient tolerated today's treatment fairly well as he continues to experience L shoulder soreness. Patient initated treatment with increased HR today with 110 bpm and finished twenty minutes on NuStep with HR of 123 bpm. Patient required short rest breaks throughout treatment today. Patient experienced minimal improved L shoulder ROM today per patient report. Patient demonstrated B shoulder weakness with B shoulder ER with yellow theraband today. Normal modalities response noted following removal of the modalities. LTGs remain on-going or partially achieved at this time secondary to deficits with L shoulder ROM, pain experienced and O2 sat below goal range.   Rehab Potential Good   PT Frequency 2x / week   PT Duration 8 weeks   PT Treatment/Interventions ADLs/Self Care Home Management;Electrical Stimulation;Moist Heat;Therapeutic exercise;Therapeutic activities;Gait training;Neuromuscular re-education;Patient/family education;Manual techniques;Passive range of motion   PT Next Visit Plan Cont with POC for shoulder ROM and overall deconditioning per  MPT/ monitor O2 and No ultrasound.   Consulted and Agree with Plan of Care Patient      Patient will benefit from skilled therapeutic intervention in order to improve the following deficits and impairments:  Abnormal gait, Decreased activity tolerance, Decreased range of motion, Decreased strength, Pain, Decreased endurance  Visit Diagnosis: Muscle weakness (generalized)  Pain in left shoulder  Stiffness of left shoulder,  not elsewhere classified     Problem List Patient Active Problem List   Diagnosis Date Noted  . Hallucination 09/27/2015  . Antineoplastic chemotherapy induced anemia 09/26/2015  . Anemia of chronic disease 09/26/2015  . Dyslipidemia associated with type 2 diabetes mellitus ([place]) 09/26/2015  . Type 2 diabetes, controlled, 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[place]) 09/26/2015  . Polypharmacy 09/25/2015  . Acute encephalopathy 09/25/2015  . Recurrent falls 09/25/2015  . Pulmonary embolism ([place]) 01/06/2015  . Enteritis due to Clostridium difficile 12/19/2014  . Protein-calorie malnutrition, severe ([place]) 11/30/2014  . 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 11/30/2014  . Bleeding duodenal ulcer s/p ex lap/oversew 11/30/2014 11/30/2014  . Hypokalemia 10/25/2014  . Lung cancer ([place]) 07/28/2014  . 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 COPD mixed type ([place]) 06/28/2014    Wynelle Fanny, PTA 07/09/2016, 12:11 PM  [facility] [address] [place], [place], [address] Phone: [phone]   Fax:  [phone]  Name: Daryl Vega MRN: [id number] Date of Birth: 08/23/1946

## 2016-07-14 ENCOUNTER — Ambulatory Visit: Payer: Medicare HMO | Admitting: Physical Therapy

## 2016-07-14 DIAGNOSIS — M25612 Stiffness of left shoulder, not elsewhere classified: Secondary | ICD-10-CM

## 2016-07-14 DIAGNOSIS — M6281 Muscle weakness (generalized): Secondary | ICD-10-CM | POA: Diagnosis not present

## 2016-07-14 DIAGNOSIS — M25512 Pain in left shoulder: Secondary | ICD-10-CM

## 2016-07-14 NOTE — Therapy (Signed)
[facility] [place], [place], [address] Phone: [phone]   Fax:  [phone]  Physical Therapy Treatment  Patient Details  Name: Daryl Vega MRN: [id number] Date of Birth: 08/10/1946 Referring Provider: Johnny Bridge MD.  Encounter Date: 07/14/2016      PT End of Session - 07/14/16 1158    Visit Number 12   Number of Visits 16   Date for PT Re-Evaluation 08/01/16   PT Start Time 1115   PT Stop Time 1211   PT Time Calculation (min) 56 min   Activity Tolerance Patient tolerated treatment well   Behavior During Therapy [facility] for tasks assessed/performed      Past Medical History:  Diagnosis Date  . Anemia   . C. difficile enteritis   . Cholecystoduodenal fistula   . COPD (chronic obstructive pulmonary disease) ([place])   . DM (diabetes mellitus) ([place])   . Duodenal ulcer   . Elevated LFTs   . FH: chemotherapy   . Gastroparesis 02/09/2015  . Hypercholesteremia   . Lung cancer ([place])   . Melanoma ([place])   . Pneumonia   . Protein calorie malnutrition ([place])   . Pulmonary embolus ([place])   . Radiation 08/03/14-08/23/14   35 gray to right chest    Past Surgical History:  Procedure Laterality Date  . CHOLECYSTECTOMY    . duodenostomy tube    . ESOPHAGOGASTRODUODENOSCOPY N/A 11/30/2014   Procedure: ESOPHAGOGASTRODUODENOSCOPY (EGD);  Surgeon: [name], MD;  Location: [facility];  Service: Endoscopy;  Laterality: N/A;  . JEJUNOSTOMY FEEDING TUBE    . LAPAROTOMY N/A 11/30/2014   Procedure: EXPLORATORY LAPAROTOMY, PYLOROPLASTY, OVERSEWING OF POSTERIOR DUODENUM, DUODENOSTOMY, CHOLECYSTECTOMY, JEJEUNOSTOMY;  Surgeon: Jackolyn Confer, MD;  Location: [facility];  Service: General;  Laterality: N/A;  . PYLOROPLASTY    . VIDEO BRONCHOSCOPY Bilateral 07/20/2014   Procedure: VIDEO BRONCHOSCOPY WITHOUT FLUORO;  Surgeon: Tanda Rockers, MD;  [facility];  Service: Cardiopulmonary;  Laterality: Bilateral;    There  were no vitals filed for this visit.      Subjective Assessment - 07/14/16 1124    Subjective L shoulder is hurting today; "about the same."   How long can you walk comfortably? Short distances.   Currently in Pain? Yes   Pain Score 7    Pain Location Shoulder   Pain Orientation Left   Pain Descriptors / Indicators Sore   Pain Type Chronic pain   Pain Onset More than a month ago   Pain Frequency Constant   Aggravating Factors  any prolonged activity   Pain Relieving Factors nothing reported                         [facility] Adult PT Treatment/Exercise - 07/14/16 1125      Knee/Hip Exercises: Aerobic   Nustep L4 x 20 min; O2 95% and HR 119     Shoulder Exercises: Seated   Retraction Left;15 reps;Theraband;Limitations   Theraband Level (Shoulder Retraction) Level 1 (Yellow)   Retraction Limitations mod cues for technique and to decrease speed   External Rotation Strengthening;Left;15 reps   Theraband Level (Shoulder External Rotation) Level 1 ([place]ellow)   External Rotation Limitations significantly limited ROM with substitutions and mod cues to correct   Other Seated Exercises Seated L shoulder UE ranger flex, abduct/adduct, circles x 4 min     Shoulder Exercises: Pulleys   Flexion 3 minutes     Moist Heat Therapy   Number Minutes  Moist Heat 15 Minutes   Moist Heat Location Shoulder     Electrical Stimulation   Electrical Stimulation Location L shoulder/ UT   Electrical Stimulation Action IFC   Electrical Stimulation Parameters x 15 min to tolerance   Electrical Stimulation Goals Pain                  PT Short Term Goals - 06/09/16 1153      PT SHORT TERM GOAL #1   Title Independent with an initial HEP.   Time 2   Period Weeks   Status Achieved           PT Long Term Goals - 07/07/16 1156      PT LONG TERM GOAL #1   Title Independent with an advanced HEP.   Time 8   Period Weeks   Status On-going     PT LONG TERM GOAL #2   Title  Active left shoulder flexion to 145 degrees so the patient can easily reach overhead   Time 8   Period Weeks     PT LONG TERM GOAL #3   Title Active ER to 70 degrees+ to allow for easily donning/doffing of apparel   Time 8   Period Weeks   Status On-going     PT LONG TERM GOAL #4   Title Perform ADL's wiht pain not > 3-4/10.   Time 8   Period Weeks   Status On-going     PT LONG TERM GOAL #5   Title Perform 20 minutes of sustained aerobic activity with an 02 sat not below 93%.   Time 8   Period Weeks   Status Partially Met  Patient able to tolerate 20 min of NuStep better with O2 sat at 95% at its lowest with very small rest break required 07/07/2016               Plan - 07/14/16 1158    Clinical Impression Statement Pt need mod to max cues for exercises and to slow down.  Rest breaks needed but overall tolerated session well.  ROM continues to be significantly limited at L shoulder.   PT Treatment/Interventions ADLs/Self Care Home Management;Electrical Stimulation;Moist Heat;Therapeutic exercise;Therapeutic activities;Gait training;Neuromuscular re-education;Patient/family education;Manual techniques;Passive range of motion   PT Next Visit Plan Cont with POC for shoulder ROM and overall deconditioning per MPT/ monitor O2 and No ultrasound.   Consulted and Agree with Plan of Care Patient      Patient will benefit from skilled therapeutic intervention in order to improve the following deficits and impairments:  Abnormal gait, Decreased activity tolerance, Decreased range of motion, Decreased strength, Pain, Decreased endurance  Visit Diagnosis: Muscle weakness (generalized)  Pain in left shoulder  Stiffness of left shoulder, not elsewhere classified     Problem List Patient Active Problem List   Diagnosis Date Noted  . Hallucination 09/27/2015  . Antineoplastic chemotherapy induced anemia 09/26/2015  . Anemia of chronic disease 09/26/2015  . Dyslipidemia  associated with type 2 diabetes mellitus ([place]) 09/26/2015  . Type 2 diabetes, controlled, 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[place]) 09/26/2015  . Polypharmacy 09/25/2015  . Acute encephalopathy 09/25/2015  . Recurrent falls 09/25/2015  . Pulmonary embolism ([place]) 01/06/2015  . Enteritis due to Clostridium difficile 12/19/2014  . Protein-calorie malnutrition, severe ([place]) 11/30/2014  . 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 11/30/2014  . Bleeding duodenal ulcer s/p ex lap/oversew 11/30/2014 11/30/2014  . Hypokalemia 10/25/2014  . Lung cancer ([place]) 07/28/2014  . Lung mass/ Sq cell  ca 100% obst RUL with R lateral wall and BI 50% obst  07/20/2014  . COPD mixed type ([place]) 06/28/2014     Laureen Abrahams, PT, DPT 07/14/16 12:26 PM   [facility] [address] [place], [place], [address] Phone: [phone]   Fax:  [phone]  Name: Daryl Vega MRN: [id number] Date of Birth: [birth year]

## 2016-07-16 ENCOUNTER — Encounter: Payer: Self-pay | Admitting: Physical Therapy

## 2016-07-16 ENCOUNTER — Ambulatory Visit: Payer: Medicare HMO | Attending: Pain Medicine | Admitting: Physical Therapy

## 2016-07-16 DIAGNOSIS — M25612 Stiffness of left shoulder, not elsewhere classified: Secondary | ICD-10-CM

## 2016-07-16 DIAGNOSIS — M25512 Pain in left shoulder: Secondary | ICD-10-CM | POA: Diagnosis present

## 2016-07-16 DIAGNOSIS — M6281 Muscle weakness (generalized): Secondary | ICD-10-CM | POA: Diagnosis present

## 2016-07-16 NOTE — Therapy (Signed)
[facility] [place], [place], [address] Phone: [phone]   Fax:  [phone]  Physical Therapy Treatment  Patient Details  Name: Daryl Vega MRN: [id number] Date of Birth: 1946-05-07 Referring Provider: Johnny Bridge MD.  Encounter Date: 07/16/2016      PT End of Session - 07/16/16 1155    Visit Number 13   Number of Visits 16   Date for PT Re-Evaluation 08/01/16   PT Start Time 1115   PT Stop Time 1213   PT Time Calculation (min) 58 min   Activity Tolerance Patient tolerated treatment well      Past Medical History:  Diagnosis Date  . Anemia   . C. difficile enteritis   . Cholecystoduodenal fistula   . COPD (chronic obstructive pulmonary disease) ([place])   . DM (diabetes mellitus) ([place])   . Duodenal ulcer   . Elevated LFTs   . FH: chemotherapy   . Gastroparesis 02/09/2015  . Hypercholesteremia   . Lung cancer ([place])   . Melanoma ([place])   . Pneumonia   . Protein calorie malnutrition ([place])   . Pulmonary embolus ([place])   . Radiation 08/03/14-08/23/14   35 gray to right chest    Past Surgical History:  Procedure Laterality Date  . CHOLECYSTECTOMY    . duodenostomy tube    . ESOPHAGOGASTRODUODENOSCOPY N/A 11/30/2014   Procedure: ESOPHAGOGASTRODUODENOSCOPY (EGD);  Surgeon: [name], MD;  Location: [facility];  Service: Endoscopy;  Laterality: N/A;  . JEJUNOSTOMY FEEDING TUBE    . LAPAROTOMY N/A 11/30/2014   Procedure: EXPLORATORY LAPAROTOMY, PYLOROPLASTY, OVERSEWING OF POSTERIOR DUODENUM, DUODENOSTOMY, CHOLECYSTECTOMY, JEJEUNOSTOMY;  Surgeon: Jackolyn Confer, MD;  Location: [facility];  Service: General;  Laterality: N/A;  . PYLOROPLASTY    . VIDEO BRONCHOSCOPY Bilateral 07/20/2014   Procedure: VIDEO BRONCHOSCOPY WITHOUT FLUORO;  Surgeon: Tanda Rockers, MD;  Location: [facility];  Service: Cardiopulmonary;  Laterality: Bilateral;    There were no vitals filed for this visit.      Subjective  Assessment - 07/16/16 1122    Subjective Patient reported no change in shoulder   Limitations Walking   How long can you walk comfortably? Short distances.   Currently in Pain? Yes   Pain Score 7    Pain Location Shoulder   Pain Orientation Left   Pain Descriptors / Indicators Sore   Pain Type Chronic pain   Pain Onset More than a month ago   Pain Frequency Constant   Aggravating Factors  any prolong activity   Pain Relieving Factors nothing reported            [facility] PT Assessment - 07/16/16 0001      ROM / Strength   AROM / PROM / Strength AROM     AROM   AROM Assessment Site Shoulder   Right/Left Shoulder Left   Left Shoulder Flexion 100 Degrees                     [facility] Adult PT Treatment/Exercise - 07/16/16 0001      Knee/Hip Exercises: Aerobic   Nustep L2 x20 min, monitored for progression O2 96% HR 97pre tx and O2 94% HR 114 post tx     Shoulder Exercises: Seated   Retraction Strengthening;Left;20 reps;Theraband   Theraband Level (Shoulder Retraction) Level 1 (Yellow)   Protraction Strengthening;Left;20 reps;Theraband   Theraband Level (Shoulder Protraction) Level 1 (Yellow)   External Rotation Strengthening;Left;10 reps;Theraband   Theraband Level (Shoulder External Rotation) Level  1 (Yellow)   Internal Rotation Strengthening;Left;10 reps;Theraband   Theraband Level (Shoulder Internal Rotation) Level 1 (Yellow)   Other Seated Exercises Seated L shoulder UE ranger flex, abduct/adduct, circles x 4 min     Shoulder Exercises: Pulleys   Flexion Other (comment)  42mn     Moist Heat Therapy   Number Minutes Moist Heat 15 Minutes   Moist Heat Location Shoulder     Electrical Stimulation   Electrical Stimulation Location L shoulder/ UT   Electrical Stimulation Action IFC   Electrical Stimulation Parameters 80-'150hz'$    Electrical Stimulation Goals Pain                  PT Short Term Goals - 06/09/16 1153      PT SHORT TERM GOAL #1    Title Independent with an initial HEP.   Time 2   Period Weeks   Status Achieved           PT Long Term Goals - 07/16/16 1139      PT LONG TERM GOAL #1   Title Independent with an advanced HEP.   Time 8   Period Weeks   Status On-going     PT LONG TERM GOAL #2   Title Active left shoulder flexion to 145 degrees so the patient can easily reach overhead   Time 8   Period Weeks   Status On-going  AROM 100 degrees 07/16/16     PT LONG TERM GOAL #3   Title Active ER to 70 degrees+ to allow for easily donning/doffing of apparel   Time 8   Period Weeks   Status On-going     PT LONG TERM GOAL #4   Title Perform ADL's wiht pain not > 3-4/10.   Time 8   Period Weeks   Status On-going     PT LONG TERM GOAL #5   Title Perform 20 minutes of sustained aerobic activity with an 02 sat not below 93%.   Time 8   Period Weeks   Status Achieved  O2 94 % and no rest breaks required 07/16/16               Plan - 07/16/16 1156    Clinical Impression Statement Patient tolerated treatment very well today and was able to keep O2 and HR WNL. Patient required no rest breaks for nustep today. Patient had some pain with IR motion today. Patient able to meet LTG#5 and others ongoing due to ROM, strength and pain deficits.   Rehab Potential Good   PT Frequency 2x / week   PT Duration 8 weeks   PT Treatment/Interventions ADLs/Self Care Home Management;Electrical Stimulation;Moist Heat;Therapeutic exercise;Therapeutic activities;Gait training;Neuromuscular re-education;Patient/family education;Manual techniques;Passive range of motion   PT Next Visit Plan Cont with POC for shoulder ROM and overall deconditioning per MPT/ monitor O2 and No ultrasound.   Consulted and Agree with Plan of Care Patient      Patient will benefit from skilled therapeutic intervention in order to improve the following deficits and impairments:  Abnormal gait, Decreased activity tolerance, Decreased range of  motion, Decreased strength, Pain, Decreased endurance  Visit Diagnosis: Muscle weakness (generalized)  Pain in left shoulder  Stiffness of left shoulder, not elsewhere classified     Problem List Patient Active Problem List   Diagnosis Date Noted  . Hallucination 09/27/2015  . Antineoplastic chemotherapy induced anemia 09/26/2015  . Anemia of chronic disease 09/26/2015  . Dyslipidemia associated with type 2 diabetes mellitus ([place] 09/26/2015  .  Type 2 diabetes, controlled, 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[place]) 09/26/2015  . Polypharmacy 09/25/2015  . Acute encephalopathy 09/25/2015  . Recurrent falls 09/25/2015  . Pulmonary embolism ([place]) 01/06/2015  . Enteritis due to Clostridium difficile 12/19/2014  . Protein-calorie malnutrition, severe ([place]) 11/30/2014  . 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 11/30/2014  . Bleeding duodenal ulcer s/p ex lap/oversew 11/30/2014 11/30/2014  . Hypokalemia 10/25/2014  . Lung cancer ([place]) 07/28/2014  . 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 COPD mixed type ([place]) 06/28/2014    Tawanna Funk P, PTA 07/16/2016, 12:14 PM  [facility] [place], [place], [address] Phone: [phone]   Fax:  [phone]  Name: Jaedin Regina MRN: [id number] Date of Birth: 09-14-1946

## 2016-07-21 ENCOUNTER — Encounter: Payer: Self-pay | Admitting: Physical Therapy

## 2016-07-21 ENCOUNTER — Ambulatory Visit: Payer: Medicare HMO | Admitting: Physical Therapy

## 2016-07-21 DIAGNOSIS — M25612 Stiffness of left shoulder, not elsewhere classified: Secondary | ICD-10-CM

## 2016-07-21 DIAGNOSIS — M6281 Muscle weakness (generalized): Secondary | ICD-10-CM | POA: Diagnosis not present

## 2016-07-21 DIAGNOSIS — M25512 Pain in left shoulder: Secondary | ICD-10-CM

## 2016-07-21 NOTE — Therapy (Signed)
[facility] [place], [place], [address] Phone: [phone]   Fax:  [phone]  Physical Therapy Treatment  Patient Details  Name: Daryl Vega MRN: [id number] Date of Birth: [birth year] Referring Provider: Johnny Bridge MD.  Encounter Date: 07/21/2016      PT End of Session - 07/21/16 1342    Visit Number 14   Number of Visits 16   Date for PT Re-Evaluation 08/01/16   PT Start Time 2694   PT Stop Time 1413   PT Time Calculation (min) 57 min   Activity Tolerance Patient tolerated treatment well   Behavior During Therapy [facility] for tasks assessed/performed      Past Medical History:  Diagnosis Date  . Anemia   . C. difficile enteritis   . Cholecystoduodenal fistula   . COPD (chronic obstructive pulmonary disease) ([place])   . DM (diabetes mellitus) ([place])   . Duodenal ulcer   . Elevated LFTs   . FH: chemotherapy   . Gastroparesis 02/09/2015  . Hypercholesteremia   . Lung cancer ([place])   . Melanoma ([place])   . Pneumonia   . Protein calorie malnutrition ([place])   . Pulmonary embolus ([place])   . Radiation 08/03/14-08/23/14   35 gray to right chest    Past Surgical History:  Procedure Laterality Date  . CHOLECYSTECTOMY    . duodenostomy tube    . ESOPHAGOGASTRODUODENOSCOPY N/A 11/30/2014   Procedure: ESOPHAGOGASTRODUODENOSCOPY (EGD);  Surgeon: [name], MD;  Location: [facility];  Service: Endoscopy;  Laterality: N/A;  . JEJUNOSTOMY FEEDING TUBE    . LAPAROTOMY N/A 11/30/2014   Procedure: EXPLORATORY LAPAROTOMY, PYLOROPLASTY, OVERSEWING OF POSTERIOR DUODENUM, DUODENOSTOMY, CHOLECYSTECTOMY, JEJEUNOSTOMY;  Surgeon: Jackolyn Confer, MD;  [facility];  Service: General;  Laterality: N/A;  . PYLOROPLASTY    . VIDEO BRONCHOSCOPY Bilateral 07/20/2014   Procedure: VIDEO BRONCHOSCOPY WITHOUT FLUORO;  Surgeon: Tanda Rockers, MD;  [facility];  Service: Cardiopulmonary;  Laterality: Bilateral;    There were  no vitals filed for this visit.      Subjective Assessment - 07/21/16 1327    Subjective Patient has reported no change in shoulder thus far    Limitations Walking   How long can you walk comfortably? Short distances.   Currently in Pain? Yes   Pain Score 7    Pain Location Shoulder   Pain Orientation Left   Pain Descriptors / Indicators Sore   Pain Type Chronic pain   Pain Onset More than a month ago   Pain Frequency Constant   Aggravating Factors  any movement of shoulder   Pain Relieving Factors nothing reported            [facility] PT Assessment - 07/21/16 0001      AROM   AROM Assessment Site Shoulder   Right/Left Shoulder Left   Left Shoulder Flexion 110 Degrees   Left Shoulder External Rotation 35 Degrees                     [facility] Adult PT Treatment/Exercise - 07/21/16 0001      Lumbar Exercises: Standing   Other Standing Lumbar Exercises cane for flexion 2x10     Knee/Hip Exercises: Aerobic   Nustep L2 x20 min, monitored for progression O2 94% HR 94 pre tx and O2 95% HR 114 post tx     Shoulder Exercises: Seated   Retraction Strengthening;Left;20 reps;Theraband   Theraband Level (Shoulder Retraction) Level 1 (Yellow)   Protraction  Strengthening;Left;20 reps;Theraband   Theraband Level (Shoulder Protraction) Level 1 (Yellow)   External Rotation Strengthening;Left;10 reps;Theraband   Theraband Level (Shoulder External Rotation) Level 1 (Yellow)   Internal Rotation Strengthening;Left;10 reps;Theraband   Theraband Level (Shoulder Internal Rotation) Level 1 (Yellow)   Other Seated Exercises Seated L shoulder UE ranger flex, abduct/adduct, circles x 4 min     Shoulder Exercises: Pulleys   Flexion --  45mn     Moist Heat Therapy   Number Minutes Moist Heat 15 Minutes   Moist Heat Location Shoulder     Electrical Stimulation   Electrical Stimulation Location L shoulder/ UT   Electrical Stimulation Action IFC   Electrical Stimulation Parameters  80-'150hz'$    Electrical Stimulation Goals Pain                  PT Short Term Goals - 06/09/16 1153      PT SHORT TERM GOAL #1   Title Independent with an initial HEP.   Time 2   Period Weeks   Status Achieved           PT Long Term Goals - 07/16/16 1139      PT LONG TERM GOAL #1   Title Independent with an advanced HEP.   Time 8   Period Weeks   Status On-going     PT LONG TERM GOAL #2   Title Active left shoulder flexion to 145 degrees so the patient can easily reach overhead   Time 8   Period Weeks   Status On-going  AROM 100 degrees 07/16/16     PT LONG TERM GOAL #3   Title Active ER to 70 degrees+ to allow for easily donning/doffing of apparel   Time 8   Period Weeks   Status On-going     PT LONG TERM GOAL #4   Title Perform ADL's wiht pain not > 3-4/10.   Time 8   Period Weeks   Status On-going     PT LONG TERM GOAL #5   Title Perform 20 minutes of sustained aerobic activity with an 02 sat not below 93%.   Time 8   Period Weeks   Status Achieved  O2 94 % and no rest breaks required 07/16/16               Plan - 07/21/16 1345    Clinical Impression Statement Patient continues to progress with activity tolerance and vitals WNL. Patient has reported not any improvement with decreased pain in shoulder. Patient has improved with AROM for both flexion and ER yet not without pain during movemment. Patient did not required rest during exercise. Patient unable to meet any further goals due to pain, strength and ROM deficits.   Rehab Potential Good   PT Frequency 2x / week   PT Duration 8 weeks   PT Treatment/Interventions ADLs/Self Care Home Management;Electrical Stimulation;Moist Heat;Therapeutic exercise;Therapeutic activities;Gait training;Neuromuscular re-education;Patient/family education;Manual techniques;Passive range of motion   PT Next Visit Plan Cont with POC for shoulder ROM and overall deconditioning per MPT/ monitor O2 and No  ultrasound.   Consulted and Agree with Plan of Care Patient      Patient will benefit from skilled therapeutic intervention in order to improve the following deficits and impairments:  Abnormal gait, Decreased activity tolerance, Decreased range of motion, Decreased strength, Pain, Decreased endurance  Visit Diagnosis: Muscle weakness (generalized)  Pain in left shoulder  Stiffness of left shoulder, not elsewhere classified     Problem List Patient Active  Problem List   Diagnosis Date Noted  . Hallucination 09/27/2015  . Antineoplastic chemotherapy induced anemia 09/26/2015  . Anemia of chronic disease 09/26/2015  . Dyslipidemia associated with type 2 diabetes mellitus ([place]) 09/26/2015  . Type 2 diabetes, controlled, 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[place]) 09/26/2015  . Polypharmacy 09/25/2015  . Acute encephalopathy 09/25/2015  . Recurrent falls 09/25/2015  . Pulmonary embolism ([place]) 01/06/2015  . Enteritis due to Clostridium difficile 12/19/2014  . Protein-calorie malnutrition, severe ([place]) 11/30/2014  . 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 11/30/2014  . Bleeding duodenal ulcer s/p ex lap/oversew 11/30/2014 11/30/2014  . Hypokalemia 10/25/2014  . Lung cancer ([place]) 07/28/2014  . 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 COPD mixed type ([place]) 06/28/2014    Mabelle Mungin P, PTA 07/21/2016, 2:39 PM  [facility] [place], [place], [address] Phone: [phone]   Fax:  [phone]  Name: Daryl Vega MRN: [id number] Date of Birth: 1946/04/20

## 2016-07-23 ENCOUNTER — Encounter: Payer: Self-pay | Admitting: Physical Therapy

## 2016-07-23 ENCOUNTER — Ambulatory Visit: Payer: Medicare HMO | Admitting: Physical Therapy

## 2016-07-23 DIAGNOSIS — M25612 Stiffness of left shoulder, not elsewhere classified: Secondary | ICD-10-CM

## 2016-07-23 DIAGNOSIS — M6281 Muscle weakness (generalized): Secondary | ICD-10-CM | POA: Diagnosis not present

## 2016-07-23 DIAGNOSIS — M25512 Pain in left shoulder: Secondary | ICD-10-CM

## 2016-07-23 NOTE — Therapy (Signed)
[facility] [place], [place], [address] Phone: [phone]   Fax:  [phone]  Physical Therapy Treatment  Patient Details  Name: Daryl Vega MRN: [id number] Date of Birth: 1946-11-24 Referring Provider: Johnny Bridge MD.  Encounter Date: 07/23/2016      PT End of Session - 07/23/16 1349    Visit Number 15   Number of Visits 16   Date for PT Re-Evaluation 08/01/16   PT Start Time 0354   PT Stop Time 1405   PT Time Calculation (min) 50 min   Activity Tolerance Patient tolerated treatment well   Behavior During Therapy [facility] for tasks assessed/performed      Past Medical History:  Diagnosis Date  . Anemia   . C. difficile enteritis   . Cholecystoduodenal fistula   . COPD (chronic obstructive pulmonary disease) ([place])   . DM (diabetes mellitus) ([place])   . Duodenal ulcer   . Elevated LFTs   . FH: chemotherapy   . Gastroparesis 02/09/2015  . Hypercholesteremia   . Lung cancer ([place])   . Melanoma ([place])   . Pneumonia   . Protein calorie malnutrition ([place])   . Pulmonary embolus ([place])   . Radiation 08/03/14-08/23/14   35 gray to right chest    Past Surgical History:  Procedure Laterality Date  . CHOLECYSTECTOMY    . duodenostomy tube    . ESOPHAGOGASTRODUODENOSCOPY N/A 11/30/2014   Procedure: ESOPHAGOGASTRODUODENOSCOPY (EGD);  Surgeon: [name], MD;  Location: [facility];  Service: Endoscopy;  Laterality: N/A;  . JEJUNOSTOMY FEEDING TUBE    . LAPAROTOMY N/A 11/30/2014   Procedure: EXPLORATORY LAPAROTOMY, PYLOROPLASTY, OVERSEWING OF POSTERIOR DUODENUM, DUODENOSTOMY, CHOLECYSTECTOMY, JEJEUNOSTOMY;  Surgeon: Jackolyn Confer, MD;  [facility]: [facility];  Service: General;  Laterality: N/A;  . PYLOROPLASTY    . VIDEO BRONCHOSCOPY Bilateral 07/20/2014   Procedure: VIDEO BRONCHOSCOPY WITHOUT FLUORO;  Surgeon: Tanda Rockers, MD;  [facility];  Service: Cardiopulmonary;  Laterality: Bilateral;    There were  no vitals filed for this visit.      Subjective Assessment - 07/23/16 1316    Subjective I'm sore all over   Pain Score 0-No pain  "just sore"                         [facility] Adult PT Treatment/Exercise - 07/23/16 0001      Knee/Hip Exercises: Aerobic   Nustep L2 x 15 minutes  O2 95%, HR 82bpm pre tx, O2 96%, HR 108bpm after treatment     Shoulder Exercises: Seated   Retraction Strengthening;Left;20 reps   Theraband Level (Shoulder Retraction) Level 1 (Yellow)   Protraction Strengthening;Left;20 reps   Theraband Level (Shoulder Protraction) Level 1 (Yellow)   External Rotation Strengthening;Left;20 reps   Theraband Level (Shoulder External Rotation) Level 1 (Yellow)   Internal Rotation Strengthening;Left;20 reps   Theraband Level (Shoulder Internal Rotation) Level 1 (Yellow)   Other Seated Exercises Seated Lt shoulder UE ranger all directions x 4 minutes     Shoulder Exercises: Pulleys   Flexion 3 minutes     Moist Heat Therapy   Number Minutes Moist Heat 15 Minutes   Moist Heat Location Shoulder     Electrical Stimulation   Electrical Stimulation Location Lt shoulder/UT   Electrical Stimulation Action IFC   Electrical Stimulation Parameters 15 mins to tolerance   Electrical Stimulation Goals Pain  PT Short Term Goals - 07/23/16 1351      PT SHORT TERM GOAL #1   Title Independent with an initial HEP.   Status Achieved           PT Long Term Goals - 07/23/16 1351      PT LONG TERM GOAL #1   Title Independent with an advanced HEP.   Status On-going     PT LONG TERM GOAL #2   Title Active left shoulder flexion to 145 degrees so the patient can easily reach overhead   Status On-going     PT LONG TERM GOAL #3   Title Active ER to 70 degrees+ to allow for easily donning/doffing of apparel   Status On-going     PT LONG TERM GOAL #4   Title Perform ADL's wiht pain not > 3-4/10.   Status On-going     PT LONG TERM  GOAL #5   Title Perform 20 minutes of sustained aerobic activity with an 02 sat not below 93%.   Status Achieved               Plan - 07/23/16 1350    Clinical Impression Statement Pt continues with decreased shoulder ROM and increased pain bilat shoulders. Pt improving activity tolerance and improved vitals with cardiovascular exercise.   Rehab Potential Good   PT Frequency 2x / week   PT Duration 8 weeks   PT Treatment/Interventions ADLs/Self Care Home Management;Electrical Stimulation;Moist Heat;Therapeutic exercise;Therapeutic activities;Gait training;Neuromuscular re-education;Patient/family education;Manual techniques;Passive range of motion   PT Next Visit Plan Reassess, d/c after visit if no more visits needed No ultrasound.   Consulted and Agree with Plan of Care Patient      Patient will benefit from skilled therapeutic intervention in order to improve the following deficits and impairments:  Abnormal gait, Decreased activity tolerance, Decreased range of motion, Decreased strength, Pain, Decreased endurance  Visit Diagnosis: Muscle weakness (generalized)  Pain in left shoulder  Stiffness of left shoulder, not elsewhere classified     Problem List Patient Active Problem List   Diagnosis Date Noted  . Hallucination 09/27/2015  . Antineoplastic chemotherapy induced anemia 09/26/2015  . Anemia of chronic disease 09/26/2015  . Dyslipidemia associated with type 2 diabetes mellitus ([place]) 09/26/2015  . Type 2 diabetes, controlled, 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[place]) 09/26/2015  . Polypharmacy 09/25/2015  . Acute encephalopathy 09/25/2015  . Recurrent falls 09/25/2015  . Pulmonary embolism ([place]) 01/06/2015  . Enteritis due to Clostridium difficile 12/19/2014  . Protein-calorie malnutrition, severe ([place]) 11/30/2014  . 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 11/30/2014  . Bleeding duodenal ulcer s/p ex lap/oversew 11/30/2014 11/30/2014  .  Hypokalemia 10/25/2014  . Lung cancer ([place]) 07/28/2014  . 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 COPD mixed type ([place]) 06/28/2014    Isabelle Course, PT, DPT 07/23/2016, 1:52 PM  [facility] [place], [place], [address] Phone: [phone]   Fax:  [phone]  Name: Azrael Maddix MRN: [id number] Date of Birth: 18-Jun-1946

## 2016-07-29 ENCOUNTER — Ambulatory Visit: Payer: Medicare HMO | Admitting: *Deleted

## 2016-07-29 DIAGNOSIS — M25612 Stiffness of left shoulder, not elsewhere classified: Secondary | ICD-10-CM

## 2016-07-29 DIAGNOSIS — M25512 Pain in left shoulder: Secondary | ICD-10-CM

## 2016-07-29 DIAGNOSIS — M6281 Muscle weakness (generalized): Secondary | ICD-10-CM | POA: Diagnosis not present

## 2016-07-29 NOTE — Therapy (Signed)
[facility] [place], [place], [address] Phone: [phone]   Fax:  [phone]  Physical Therapy Treatment  Patient Details  Name: Daryl Vega MRN: [id number] Date of Birth: [birth year] Referring Provider: Johnny Bridge MD.  Encounter Date: 07/29/2016      PT End of Session - 07/29/16 1122    Visit Number 16   Number of Visits 16   Date for PT Re-Evaluation 08/01/16   PT Start Time 1115   PT Stop Time 1203   PT Time Calculation (min) 48 min      Past Medical History:  Diagnosis Date  . Anemia   . C. difficile enteritis   . Cholecystoduodenal fistula   . COPD (chronic obstructive pulmonary disease) ([place])   . DM (diabetes mellitus) ([place])   . Duodenal ulcer   . Elevated LFTs   . FH: chemotherapy   . Gastroparesis 02/09/2015  . Hypercholesteremia   . Lung cancer ([place])   . Melanoma ([place])   . Pneumonia   . Protein calorie malnutrition ([place])   . Pulmonary embolus ([place])   . Radiation 08/03/14-08/23/14   35 gray to right chest    Past Surgical History:  Procedure Laterality Date  . CHOLECYSTECTOMY    . duodenostomy tube    . ESOPHAGOGASTRODUODENOSCOPY N/A 11/30/2014   Procedure: ESOPHAGOGASTRODUODENOSCOPY (EGD);  Surgeon: [name], MD;  Location: [facility];  Service: Endoscopy;  Laterality: N/A;  . JEJUNOSTOMY FEEDING TUBE    . LAPAROTOMY N/A 11/30/2014   Procedure: EXPLORATORY LAPAROTOMY, PYLOROPLASTY, OVERSEWING OF POSTERIOR DUODENUM, DUODENOSTOMY, CHOLECYSTECTOMY, JEJEUNOSTOMY;  Surgeon: Jackolyn Confer, MD;  Location: [facility];  Service: General;  Laterality: N/A;  . PYLOROPLASTY    . VIDEO BRONCHOSCOPY Bilateral 07/20/2014   Procedure: VIDEO BRONCHOSCOPY WITHOUT FLUORO;  Surgeon: Tanda Rockers, MD;  Location: [facility];  Service: Cardiopulmonary;  Laterality: Bilateral;    There were no vitals filed for this visit.      Subjective Assessment - 07/29/16 1117    Subjective Lt shldr is 6-7/10  today. DC today   Limitations Walking   How long can you walk comfortably? Short distances.   Currently in Pain? Yes   Pain Score 6    Pain Location Shoulder   Pain Orientation Left   Pain Descriptors / Indicators Sore   Pain Type Chronic pain   Pain Onset More than a month ago   Pain Frequency Constant                         [facility] Adult PT Treatment/Exercise - 07/29/16 0001      Knee/Hip Exercises: Aerobic   Nustep L2 x 15 minutes     Shoulder Exercises: Seated   Retraction Strengthening;Left;20 reps   Theraband Level (Shoulder Retraction) Level 1 (Yellow)   Protraction Strengthening;Left;20 reps   Theraband Level (Shoulder Protraction) Level 1 (Yellow)   External Rotation Strengthening;Left;20 reps   Theraband Level (Shoulder External Rotation) Level 1 (Yellow)   Internal Rotation Strengthening;Left;20 reps   Theraband Level (Shoulder Internal Rotation) Level 1 (Yellow)   Other Seated Exercises Seated Lt shoulder UE ranger all directions x 4 minutes     Moist Heat Therapy   Number Minutes Moist Heat 15 Minutes   Moist Heat Location Shoulder     Electrical Stimulation   Electrical Stimulation Location Lt shoulder/UT   Electrical Stimulation Action IFC 80-'[id number]'$  x 15 minsa   Electrical Stimulation Goals Pain  PT Short Term Goals - 07/23/16 1351      PT SHORT TERM GOAL #1   Title Independent with an initial HEP.   Status Achieved           PT Long Term Goals - 07/29/16 1144      PT LONG TERM GOAL #3   Time (P)  8   Status (P)  Not Met  NM due to pain/ tightness     PT LONG TERM GOAL #4   Title (P)  Perform ADL's wiht pain not > 3-4/10.   Time (P)  8   Period (P)  Weeks   Status (P)  Not Met     PT LONG TERM GOAL #5   Title (P)  Perform 20 minutes of sustained aerobic activity with an 02 sat not below 93%.   Time (P)  8   Period (P)  Weeks   Status (P)  Achieved             Patient will benefit  from skilled therapeutic intervention in order to improve the following deficits and impairments:     Visit Diagnosis: Muscle weakness (generalized)  Pain in left shoulder  Stiffness of left shoulder, not elsewhere classified     Problem List Patient Active Problem List   Diagnosis Date Noted  . Hallucination 09/27/2015  . Antineoplastic chemotherapy induced anemia 09/26/2015  . Anemia of chronic disease 09/26/2015  . Dyslipidemia associated with type 2 diabetes mellitus ([place]) 09/26/2015  . Type 2 diabetes, controlled, 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[place]) 09/26/2015  . Polypharmacy 09/25/2015  . Acute encephalopathy 09/25/2015  . Recurrent falls 09/25/2015  . Pulmonary embolism ([place]) 01/06/2015  . Enteritis due to Clostridium difficile 12/19/2014  . Protein-calorie malnutrition, severe ([place]) 11/30/2014  . 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 11/30/2014  . Bleeding duodenal ulcer s/p ex lap/oversew 11/30/2014 11/30/2014  . Hypokalemia 10/25/2014  . Lung cancer ([place]) 07/28/2014  . 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 COPD mixed type ([place]) 06/28/2014    Kindra Bickham,CHRIS, PTA 07/29/2016, 12:20 PM  [facility] [address] [place], [place], [address] Phone: [phone]   Fax:  [phone]  Name: Efrain Clauson MRN: [id number] Date of Birth: [birth year]

## 2016-07-30 NOTE — Therapy (Signed)
[facility] [place], [place], [address] Phone: [phone]   Fax:  [phone]  Physical Therapy Treatment  Patient Details  Name: Daryl Vega MRN: [id number] Date of Birth: [birth year] Referring Provider: Johnny Bridge MD.  Encounter Date: 07/29/2016      PT End of Session - 07/29/16 1122    Visit Number 16   Number of Visits 16   Date for PT Re-Evaluation 08/01/16   PT Start Time 1115   PT Stop Time 1203   PT Time Calculation (min) 48 min      Past Medical History:  Diagnosis Date  . Anemia   . C. difficile enteritis   . Cholecystoduodenal fistula   . COPD (chronic obstructive pulmonary disease) ([place])   . DM (diabetes mellitus) ([place])   . Duodenal ulcer   . Elevated LFTs   . FH: chemotherapy   . Gastroparesis 02/09/2015  . Hypercholesteremia   . Lung cancer ([place])   . Melanoma ([place])   . Pneumonia   . Protein calorie malnutrition ([place])   . Pulmonary embolus ([place])   . Radiation 08/03/14-08/23/14   35 gray to right chest    Past Surgical History:  Procedure Laterality Date  . CHOLECYSTECTOMY    . duodenostomy tube    . ESOPHAGOGASTRODUODENOSCOPY N/A 11/30/2014   Procedure: ESOPHAGOGASTRODUODENOSCOPY (EGD);  Surgeon: [name], MD;  Location: [facility];  Service: Endoscopy;  Laterality: N/A;  . JEJUNOSTOMY FEEDING TUBE    . LAPAROTOMY N/A 11/30/2014   Procedure: EXPLORATORY LAPAROTOMY, PYLOROPLASTY, OVERSEWING OF POSTERIOR DUODENUM, DUODENOSTOMY, CHOLECYSTECTOMY, JEJEUNOSTOMY;  Surgeon: Jackolyn Confer, MD;  Location: [facility];  Service: General;  Laterality: N/A;  . PYLOROPLASTY    . VIDEO BRONCHOSCOPY Bilateral 07/20/2014   Procedure: VIDEO BRONCHOSCOPY WITHOUT FLUORO;  Surgeon: Tanda Rockers, MD;  [facility];  Service: Cardiopulmonary;  Laterality: Bilateral;    There were no vitals filed for this visit.      Subjective Assessment - 07/29/16 1117    Subjective Lt shldr is 6-7/10  today. DC today   Limitations Walking   How long can you walk comfortably? Short distances.   Currently in Pain? Yes   Pain Score 6    Pain Location Shoulder   Pain Orientation Left   Pain Descriptors / Indicators Sore   Pain Type Chronic pain   Pain Onset More than a month ago   Pain Frequency Constant                                   PT Short Term Goals - 07/23/16 1351      PT SHORT TERM GOAL #1   Title Independent with an initial HEP.   Status Achieved           PT Long Term Goals - 07/29/16 1144      PT LONG TERM GOAL #1   Title Independent with an advanced HEP.   Time 8   Period Weeks   Status Achieved     PT LONG TERM GOAL #2   Title Active left shoulder flexion to 145 degrees so the patient can easily reach overhead  Only 90 degrees due to pain/tightness   Time 8   Period Weeks   Status Not Met     PT LONG TERM GOAL #3   Title Active ER to 70 degrees+ to allow for easily donning/doffing of apparel   Time  8   Period Weeks   Status Not Met  NM due to pain/ tightness     PT LONG TERM GOAL #4   Title Perform ADL's wiht pain not > 3-4/10.   Time 8   Period Weeks   Status Not Met  NM due to pain 6-7/10     PT LONG TERM GOAL #5   Title Perform 20 minutes of sustained aerobic activity with an 02 sat not below 93%.   Time 8   Period Weeks   Status Achieved               Plan - 07/29/16 1315    Clinical Impression Statement Pt did fair with Rx and is independent with HEP. He was able to meet LTG for cardiovascular goal and O2 >93%, but was unable to meet other LTGs for ROM and strength due to pain and deficits.    Rehab Potential Good   PT Frequency 2x / week   PT Duration 8 weeks   PT Treatment/Interventions ADLs/Self Care Home Management;Electrical Stimulation;Moist Heat;Therapeutic exercise;Therapeutic activities;Gait training;Neuromuscular re-education;Patient/family education;Manual techniques;Passive range of  motion   PT Next Visit Plan DC to HEP due to lack of progression. Pt to have procedure performed to help with pain      Patient will benefit from skilled therapeutic intervention in order to improve the following deficits and impairments:  Abnormal gait, Decreased activity tolerance, Decreased range of motion, Decreased strength, Pain, Decreased endurance  Visit Diagnosis: Muscle weakness (generalized)  Pain in left shoulder  Stiffness of left shoulder, not elsewhere classified     Problem List Patient Active Problem List   Diagnosis Date Noted  . Hallucination 09/27/2015  . Antineoplastic chemotherapy induced anemia 09/26/2015  . Anemia of chronic disease 09/26/2015  . Dyslipidemia associated with type 2 diabetes mellitus ([place]) 09/26/2015  . Type 2 diabetes, controlled, 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[place]) 09/26/2015  . Polypharmacy 09/25/2015  . Acute encephalopathy 09/25/2015  . Recurrent falls 09/25/2015  . Pulmonary embolism ([place]) 01/06/2015  . Enteritis due to Clostridium difficile 12/19/2014  . Protein-calorie malnutrition, severe ([place]) 11/30/2014  . 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 11/30/2014  . Bleeding duodenal ulcer s/p ex lap/oversew 11/30/2014 11/30/2014  . Hypokalemia 10/25/2014  . Lung cancer ([place]) 07/28/2014  . 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 COPD mixed type ([place]) 06/28/2014  PHYSICAL THERAPY DISCHARGE SUMMARY  Visits from Start of Care: 16.  Current functional level related to goals / functional outcomes: Please see above.   Remaining deficits: Continued left shoulder pain and decreased range of motion.   Education / Equipment: HEP. Plan: Patient agrees to discharge.  Patient goals were partially met. Patient is being discharged due to                                                     ?????       Calayah Guadarrama, [name] MPT 07/30/2016, 3:35 PM  [facility] [place], [place], [address] Phone: [phone]   Fax:  [phone]  Name: Daryl Vega MRN: [id number] Date of Birth: August 11, 1946

## 2016-12-31 ENCOUNTER — Other Ambulatory Visit: Payer: Self-pay | Admitting: Nurse Practitioner

## 2017-01-20 IMAGING — DX DG ABD PORTABLE 1V
2 series · 2 of 2 positions shown · non-contrast
Comparison: Portable exam at 9741 hours compared to 02/21/2015

CLINICAL DATA: Nausea and vomiting today, constipation since
[REDACTED] due to medications, diabetes mellitus, lung cancer,
melanoma, COPD, former smoker

EXAM:
PORTABLE ABDOMEN - 1 VIEW

[abdomen kub (1 of 2)]
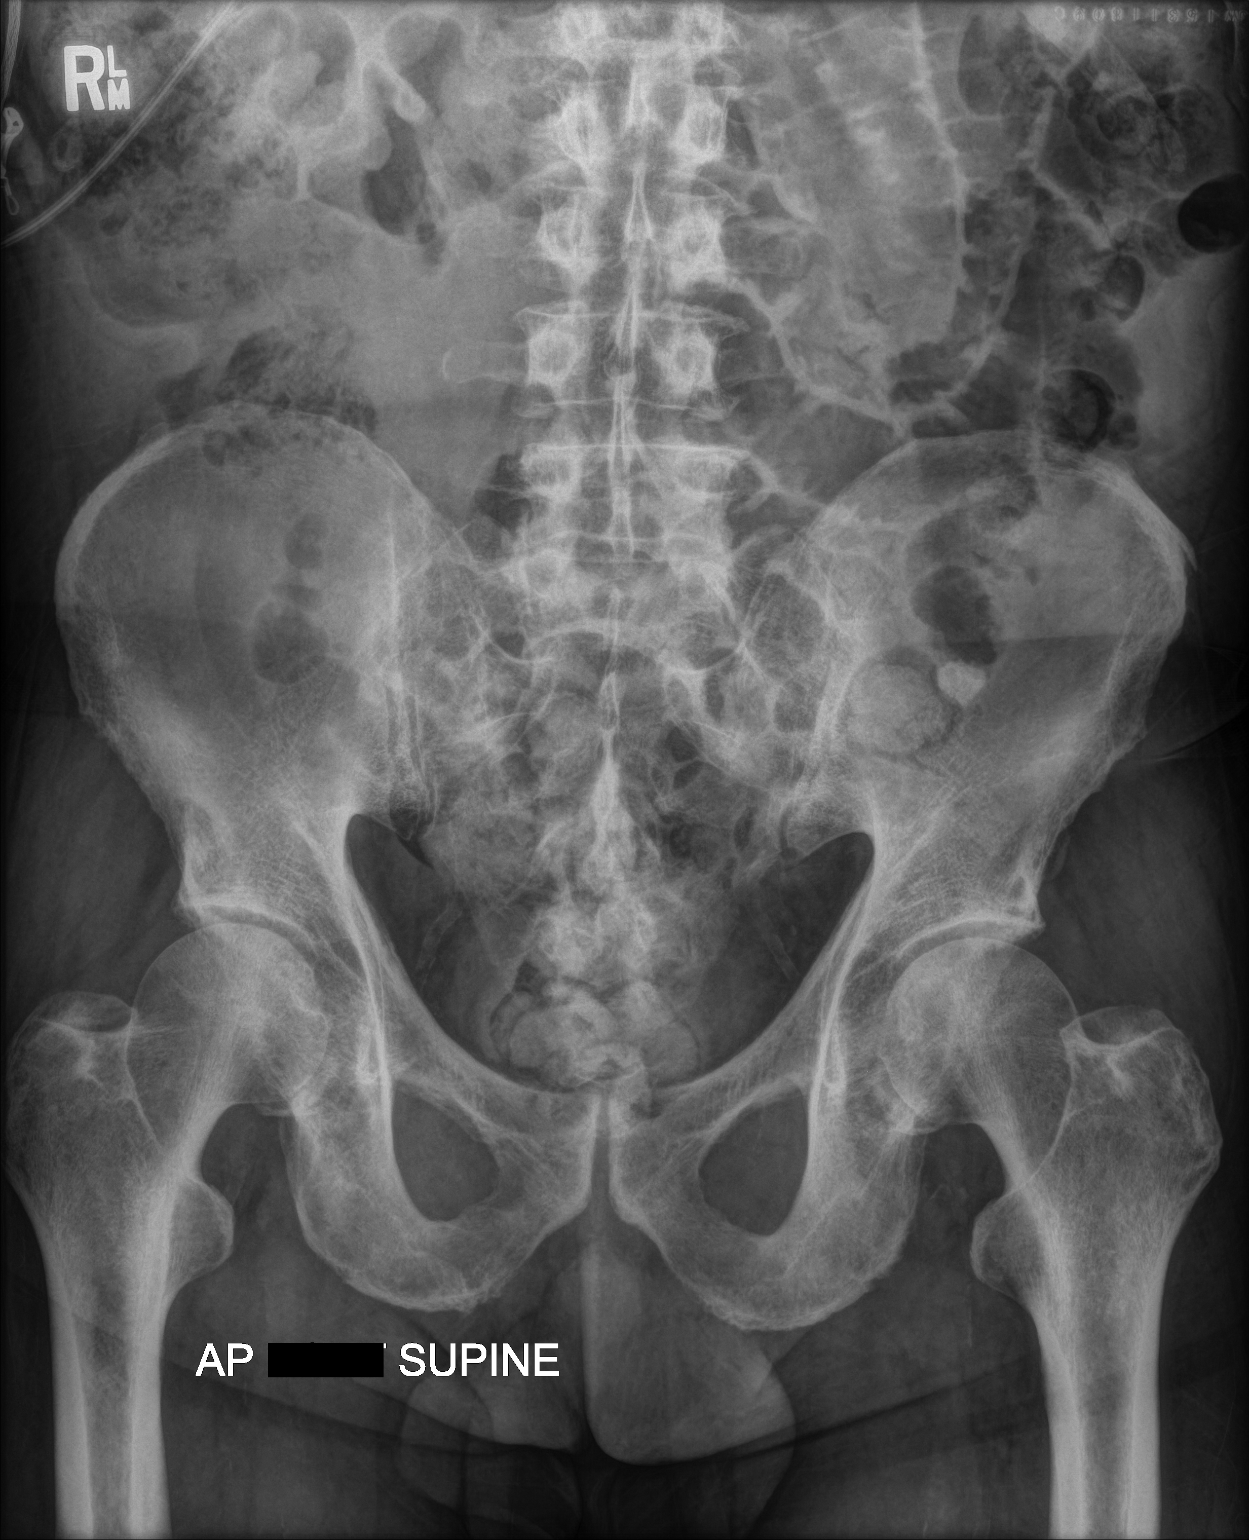

[abdomen kub (2 of 2)]
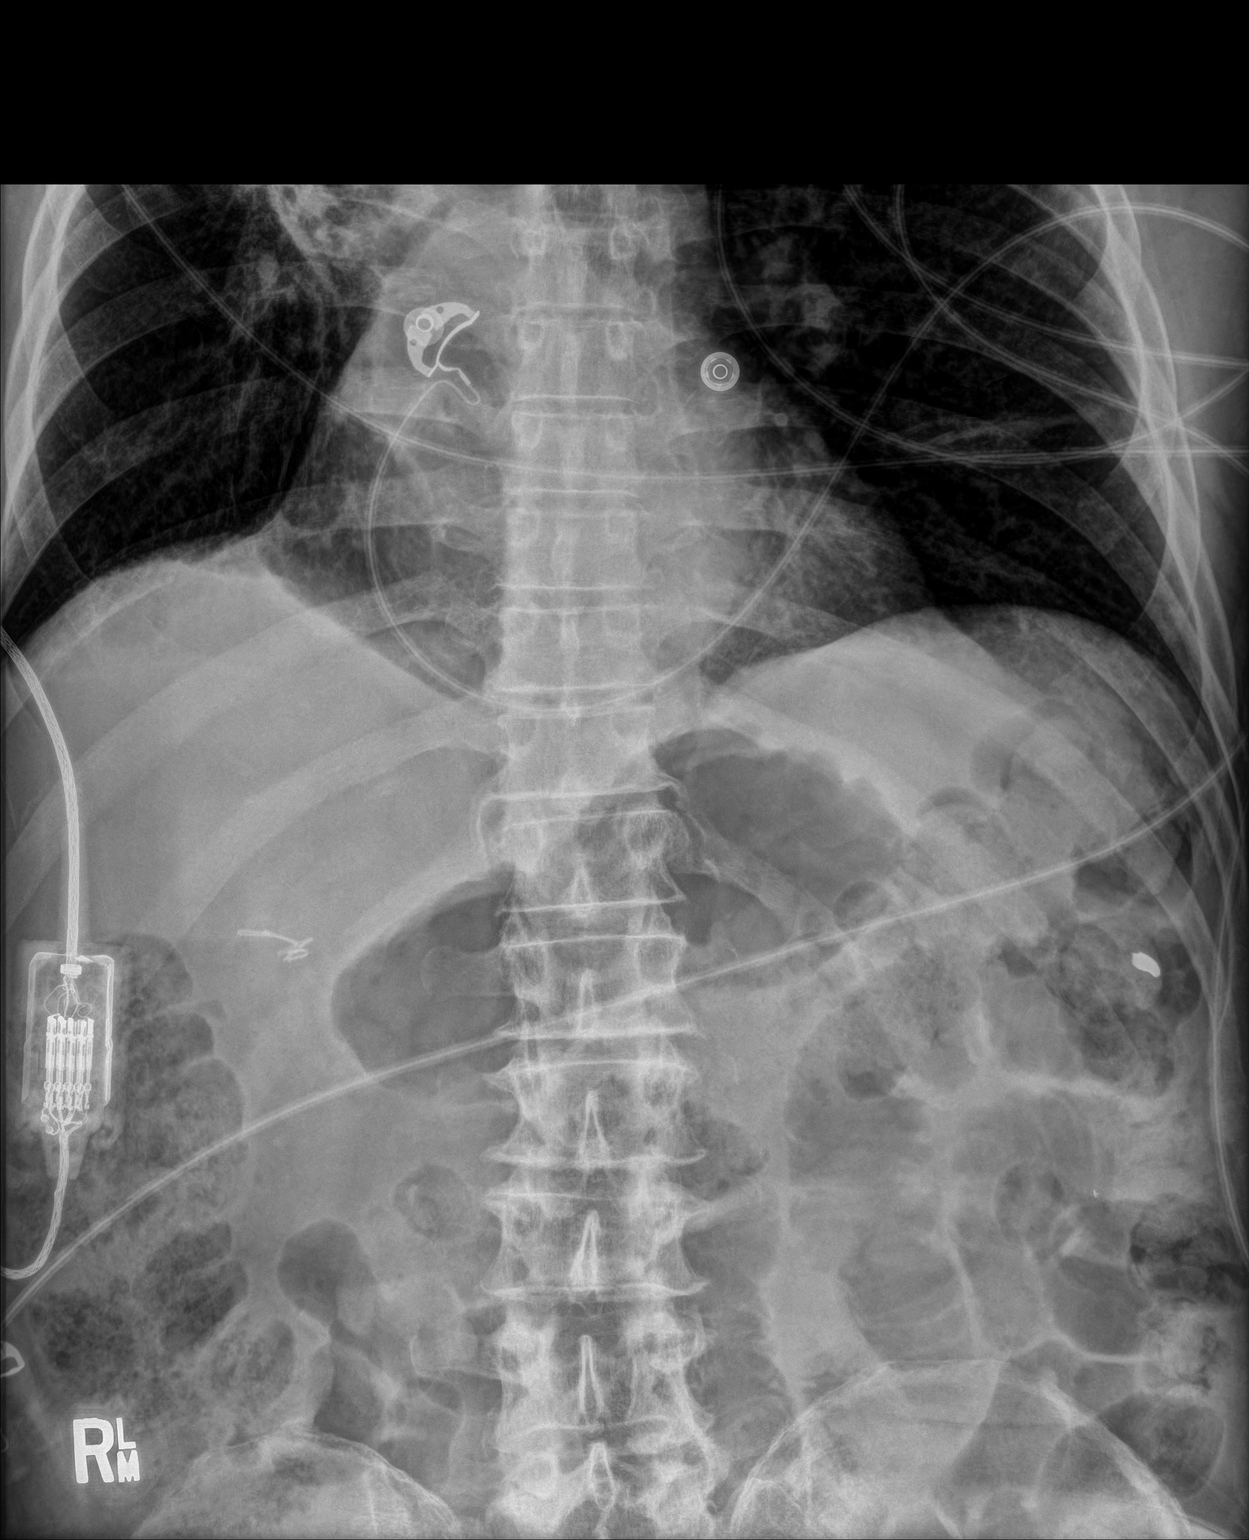

[2 of 2 positions shown; findings below may reference images not displayed]

FINDINGS: Scattered stool throughout colon to rectum.

Air-filled nondistended small bowel loops.

No bowel dilatation, bowel wall thickening or free intraperitoneal
air.

Chronic opacity RIGHT upper lobe likely related to prior lung cancer
in treatment unchanged.

Surgical clips RIGHT upper quadrant likely due to cholecystectomy.

Osseous structures unremarkable.
IMPRESSION: Nonobstructive bowel gas pattern.

## 2018-06-10 ENCOUNTER — Encounter: Payer: Self-pay | Admitting: Physical Therapy

## 2018-06-10 ENCOUNTER — Other Ambulatory Visit: Payer: Self-pay

## 2018-06-10 ENCOUNTER — Ambulatory Visit: Payer: Medicare Other | Attending: Pain Medicine | Admitting: Physical Therapy

## 2018-06-10 DIAGNOSIS — G8929 Other chronic pain: Secondary | ICD-10-CM | POA: Insufficient documentation

## 2018-06-10 DIAGNOSIS — M5441 Lumbago with sciatica, right side: Secondary | ICD-10-CM | POA: Insufficient documentation

## 2018-06-10 NOTE — Therapy (Addendum)
[facility] [place], [place], [address] Phone: [phone]   Fax:  [phone]  Physical Therapy Evaluation  Patient Details  Name: Daryl Vega MRN: [id number] Date of Birth: 14-Oct-1946 Referring Provider: Johnny Bridge, MD   Encounter Date: 06/10/2018  PT End of Session - 06/10/18 2231    Visit Number  1    Number of Visits  1 per patient Eval only    Date for PT Re-Evaluation  06/17/18    PT Start Time  1430    PT Stop Time  1500    PT Time Calculation (min)  30 min    Activity Tolerance  Patient tolerated treatment well    Behavior During Therapy  [facility] for tasks assessed/performed       Past Medical History:  Diagnosis Date  . Anemia   . C. difficile enteritis   . Cholecystoduodenal fistula   . COPD (chronic obstructive pulmonary disease) ([place])   . DM (diabetes mellitus) ([place])   . Duodenal ulcer   . Elevated LFTs   . FH: chemotherapy   . Gastroparesis 02/09/2015  . Hypercholesteremia   . Lung cancer ([place])   . Melanoma ([place])   . Pneumonia   . Protein calorie malnutrition ([place])   . Pulmonary embolus ([place])   . Radiation 08/03/14-08/23/14   35 gray to right chest    Past Surgical History:  Procedure Laterality Date  . CHOLECYSTECTOMY    . duodenostomy tube    . ESOPHAGOGASTRODUODENOSCOPY N/A 11/30/2014   Procedure: ESOPHAGOGASTRODUODENOSCOPY (EGD);  Surgeon: [name], MD;  Location: [facility];  Service: Endoscopy;  Laterality: N/A;  . JEJUNOSTOMY FEEDING TUBE    . LAPAROTOMY N/A 11/30/2014   Procedure: EXPLORATORY LAPAROTOMY, PYLOROPLASTY, OVERSEWING OF POSTERIOR DUODENUM, DUODENOSTOMY, CHOLECYSTECTOMY, JEJEUNOSTOMY;  Surgeon: Jackolyn Confer, MD;  Location: [facility];  Service: General;  Laterality: N/A;  . PYLOROPLASTY    . VIDEO BRONCHOSCOPY Bilateral 07/20/2014   Procedure: VIDEO BRONCHOSCOPY WITHOUT FLUORO;  Surgeon: Tanda Rockers, MD;  [facility];  Service: Cardiopulmonary;   Laterality: Bilateral;    There were no vitals filed for this visit.   Subjective Assessment - 06/10/18 2222    Subjective  Patient arrives to physical therapy with ongoing low back pain with recent reports of radiating pain with numbness and tingling to foot. Patient reported MRI stated a ruptured disc but is unable to report level. Patient reports pain with walking, and ADLs but states he pushes through most of the time. Patient reports pain at worst is 8/10 with increased activity and reports pain is "mild" with pain medication. Patient's goals are to decrease pain, improve movement and improve strength.    Limitations  House hold activities;Walking    Patient Stated Goals  no pain    Currently in Pain?  Yes    Pain Score  8     Pain Orientation  Right    Pain Descriptors / Indicators  Sore;Shooting    Pain Type  Chronic pain    Pain Onset  More than a month ago    Pain Frequency  Intermittent    Aggravating Factors   no meds    Pain Relieving Factors  medication    Effect of Pain on Daily Activities  limits movement         [facility] PT Assessment - 06/10/18 0001      Assessment   Medical Diagnosis  Pain syndrome, back pain, radicular leg pain    Referring Provider  Johnny Bridge, MD    Next MD Visit  August 14    Prior Therapy  no      Precautions   Precautions  None      Restrictions   Weight Bearing Restrictions  No      Balance Screen   Has the patient fallen in the past 6 months  Yes    How many times?  1    Has the patient had a decrease in activity level because of a fear of falling?   No    Is the patient reluctant to leave their home because of a fear of falling?   No      Home Film/video editor residence    Living Arrangements  Spouse/significant other      Prior Function   Level of Independence  Independent      ROM / Strength   AROM / PROM / Strength  Strength      Strength   Strength Assessment Site  Hip;Knee    Right/Left  Hip  Left;Right    Right Hip Flexion  4/5    Right Hip Extension  4-/5    Right Hip ABduction  4-/5    Left Hip Flexion  4/5    Left Hip Extension  4-/5    Left Hip ABduction  4/5    Right/Left Knee  Right;Left    Right Knee Flexion  4+/5    Right Knee Extension  4+/5    Left Knee Flexion  4+/5    Left Knee Extension  5/5      Palpation   Palpation comment  tenderness to palpation along right piriformis and upper gluteal                Objective measurements completed on examination: See above findings.              PT Education - 06/10/18 2230    Education Details  HEP of draw in, supine clams, hip adduction, piriformis stretch, hamstring stretch, and SKTC stretch    Person(s) Educated  Patient    Methods  Explanation;Handout    Comprehension  Verbalized understanding       PT Short Term Goals - 06/10/18 2234      PT SHORT TERM GOAL #1   Title  Independent with an initial HEP.    Time  1    Period  Days    Status  New                Plan - 06/10/18 2232    Clinical Impression Statement  Patient is a 72 year old male who presents to physical therapy with reports of low back pain with right sided radiating pain with numbness and tingling. Patient demonstrates decreased LE strength and tenderness to palpation along right upper gluteals and piriformis. Due to financial situation, patient requested for evaluation only. Patient educated the importance of daily exercise and stretching for proper posture and for muscular endurance to sustain for daily activities and to decrease pain.  Patient provided with HEP consisting of hip and core strengthening and LE stretching to which patient reported understanding.    Clinical Presentation  Evolving    Clinical Decision Making  Low    Rehab Potential  Good    PT Frequency  One time visit    PT Duration  Other (comment)    PT Treatment/Interventions  Cryotherapy;Electrical Stimulation;Moist Heat;Therapeutic  exercise;Patient/family education  PT Next Visit Plan  N/A    PT Home Exercise Plan  See patient education    Consulted and Agree with Plan of Care  Patient       Patient will benefit from skilled therapeutic intervention in order to improve the following deficits and impairments:  Pain, Postural dysfunction, Decreased endurance, Decreased range of motion, Decreased strength  Visit Diagnosis: Chronic right-sided low back pain with right-sided sciatica     Problem List Patient Active Problem List   Diagnosis Date Noted  . Hallucination 09/27/2015  . Antineoplastic chemotherapy induced anemia 09/26/2015  . Anemia of chronic disease 09/26/2015  . Dyslipidemia associated with type 2 diabetes mellitus ([place]) 09/26/2015  . Type 2 diabetes, controlled, 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[place]) 09/26/2015  . Polypharmacy 09/25/2015  . Acute encephalopathy 09/25/2015  . Recurrent falls 09/25/2015  . Pulmonary embolism ([place]) 01/06/2015  . Enteritis due to Clostridium difficile 12/19/2014  . Protein-calorie malnutrition, severe ([place]) 11/30/2014  . 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 11/30/2014  . Bleeding duodenal ulcer s/p ex lap/oversew 11/30/2014 11/30/2014  . Hypokalemia 10/25/2014  . Lung cancer ([place]) 07/28/2014  . 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 COPD mixed type ([place]) 06/28/2014    PHYSICAL THERAPY DISCHARGE SUMMARY  Visits from Start of Care: 1  Current functional level related to goals / functional outcomes: See above   Remaining deficits: See goals   Education / Equipment: HEP Plan: Patient agrees to discharge.  Patient goals were met. Patient is being discharged due to the patient's request.  ?????      Gabriela Eves, PT, DPT 06/10/2018, 10:35 PM  [facility] [place], [place], [address] Phone: [phone]   Fax:   [phone]  Name: Cornell Bourbon MRN: [id number] Date of Birth: 11/18/1946

## 2020-02-23 ENCOUNTER — Ambulatory Visit: Payer: Medicare Other | Attending: Internal Medicine

## 2020-02-23 DIAGNOSIS — Z23 Encounter for immunization: Secondary | ICD-10-CM

## 2020-02-23 NOTE — Progress Notes (Signed)
   Covid-19 Vaccination Clinic  Name:  Daryl Vega    MRN: [id number] DOB: 03/31/1946  02/23/2020  [name] was observed post Covid-19 immunization for 15 minutes without incident. He was provided with Vaccine Information Sheet and instruction to access the V-Safe system.   [name] was instructed to call 911 with any severe reactions post vaccine: [name] Difficulty breathing  . Swelling of face and throat  . A fast heartbeat  . A bad rash all over body  . Dizziness and weakness   Immunizations Administered    Name Date Dose VIS Date Route   Pfizer COVID-19 Vaccine 02/23/2020  9:26 AM 0.3 mL 11/25/2019 Intramuscular   Manufacturer: [place]   Lot: [id number]   [place]: [id number]

## 2020-03-19 ENCOUNTER — Ambulatory Visit: Payer: Medicare Other | Attending: Internal Medicine

## 2020-03-19 DIAGNOSIS — Z23 Encounter for immunization: Secondary | ICD-10-CM

## 2020-03-19 NOTE — Progress Notes (Signed)
   Covid-19 Vaccination Clinic  Name:  [name]    MRN: [id number] DOB: Jan 13, 1946  03/19/2020  [name] was observed post Covid-19 immunization for 15 minutes without incident. He was provided with Vaccine Information Sheet and instruction to access the V-Safe system.   [name] was instructed to call 911 with any severe reactions post vaccine: [name] Difficulty breathing  . Swelling of face and throat  . A fast heartbeat  . A bad rash all over body  . Dizziness and weakness   Immunizations Administered    Name Date Dose VIS Date Route   Pfizer COVID-19 Vaccine 03/19/2020  8:49 AM 0.3 mL 11/25/2019 Intramuscular   Manufacturer: [place]   Lot: [id number]   [place]: [id number]

## 2020-05-02 ENCOUNTER — Inpatient Hospital Stay (HOSPITAL_COMMUNITY)
Admission: EM | Admit: 2020-05-02 | Discharge: 2020-05-04 | DRG: 871 | Disposition: A | Discharge status: Home / Self Care | Payer: Medicare Other | Attending: Internal Medicine | Admitting: Internal Medicine

## 2020-05-02 ENCOUNTER — Other Ambulatory Visit: Payer: Self-pay

## 2020-05-02 ENCOUNTER — Emergency Department (HOSPITAL_COMMUNITY): Payer: Medicare Other

## 2020-05-02 ENCOUNTER — Encounter (HOSPITAL_COMMUNITY): Payer: Self-pay | Admitting: Emergency Medicine

## 2020-05-02 DIAGNOSIS — E1142 Type 2 diabetes mellitus with diabetic polyneuropathy: Secondary | ICD-10-CM | POA: Diagnosis not present

## 2020-05-02 DIAGNOSIS — Z7984 Long term (current) use of oral hypoglycemic drugs: Secondary | ICD-10-CM | POA: Diagnosis not present

## 2020-05-02 DIAGNOSIS — Z20822 Contact with and (suspected) exposure to covid-19: Secondary | ICD-10-CM | POA: Diagnosis present

## 2020-05-02 DIAGNOSIS — Z86711 Personal history of pulmonary embolism: Secondary | ICD-10-CM

## 2020-05-02 DIAGNOSIS — R079 Chest pain, unspecified: Secondary | ICD-10-CM | POA: Diagnosis present

## 2020-05-02 DIAGNOSIS — A419 Sepsis, unspecified organism: Principal | ICD-10-CM | POA: Diagnosis present

## 2020-05-02 DIAGNOSIS — C3411 Malignant neoplasm of upper lobe, right bronchus or lung: Secondary | ICD-10-CM

## 2020-05-02 DIAGNOSIS — Z8249 Family history of ischemic heart disease and other diseases of the circulatory system: Secondary | ICD-10-CM

## 2020-05-02 DIAGNOSIS — Z87891 Personal history of nicotine dependence: Secondary | ICD-10-CM | POA: Diagnosis not present

## 2020-05-02 DIAGNOSIS — R296 Repeated falls: Secondary | ICD-10-CM | POA: Diagnosis present

## 2020-05-02 DIAGNOSIS — J690 Pneumonitis due to inhalation of food and vomit: Secondary | ICD-10-CM | POA: Diagnosis present

## 2020-05-02 DIAGNOSIS — I10 Essential (primary) hypertension: Secondary | ICD-10-CM | POA: Diagnosis not present

## 2020-05-02 DIAGNOSIS — Z801 Family history of malignant neoplasm of trachea, bronchus and lung: Secondary | ICD-10-CM

## 2020-05-02 DIAGNOSIS — E785 Hyperlipidemia, unspecified: Secondary | ICD-10-CM | POA: Diagnosis present

## 2020-05-02 DIAGNOSIS — Z8711 Personal history of peptic ulcer disease: Secondary | ICD-10-CM | POA: Diagnosis not present

## 2020-05-02 DIAGNOSIS — Z923 Personal history of irradiation: Secondary | ICD-10-CM | POA: Diagnosis not present

## 2020-05-02 DIAGNOSIS — E119 Type 2 diabetes mellitus without complications: Secondary | ICD-10-CM | POA: Diagnosis present

## 2020-05-02 DIAGNOSIS — G629 Polyneuropathy, unspecified: Secondary | ICD-10-CM | POA: Diagnosis present

## 2020-05-02 DIAGNOSIS — Z7901 Long term (current) use of anticoagulants: Secondary | ICD-10-CM

## 2020-05-02 DIAGNOSIS — K219 Gastro-esophageal reflux disease without esophagitis: Secondary | ICD-10-CM | POA: Diagnosis present

## 2020-05-02 DIAGNOSIS — Z7951 Long term (current) use of inhaled steroids: Secondary | ICD-10-CM | POA: Diagnosis not present

## 2020-05-02 DIAGNOSIS — J449 Chronic obstructive pulmonary disease, unspecified: Secondary | ICD-10-CM | POA: Diagnosis present

## 2020-05-02 DIAGNOSIS — J189 Pneumonia, unspecified organism: Secondary | ICD-10-CM

## 2020-05-02 DIAGNOSIS — I440 Atrioventricular block, first degree: Secondary | ICD-10-CM | POA: Diagnosis present

## 2020-05-02 DIAGNOSIS — E782 Mixed hyperlipidemia: Secondary | ICD-10-CM | POA: Diagnosis not present

## 2020-05-02 DIAGNOSIS — Z79899 Other long term (current) drug therapy: Secondary | ICD-10-CM | POA: Diagnosis not present

## 2020-05-02 DIAGNOSIS — G8929 Other chronic pain: Secondary | ICD-10-CM | POA: Diagnosis present

## 2020-05-02 DIAGNOSIS — Z85118 Personal history of other malignant neoplasm of bronchus and lung: Secondary | ICD-10-CM | POA: Diagnosis not present

## 2020-05-02 DIAGNOSIS — Z9221 Personal history of antineoplastic chemotherapy: Secondary | ICD-10-CM | POA: Diagnosis not present

## 2020-05-02 DIAGNOSIS — R0902 Hypoxemia: Secondary | ICD-10-CM

## 2020-05-02 DIAGNOSIS — Z85828 Personal history of other malignant neoplasm of skin: Secondary | ICD-10-CM

## 2020-05-02 LAB — GLUCOSE, CAPILLARY: Glucose-Capillary: 120 mg/dL — ABNORMAL HIGH (ref 70–99)

## 2020-05-02 LAB — TROPONIN I (HIGH SENSITIVITY)
Troponin I (High Sensitivity): 5 ng/L (ref <18)
Troponin I (High Sensitivity): 5 ng/L (ref <18)
Troponin I (High Sensitivity): 5 ng/L (ref <18)

## 2020-05-02 LAB — I-STAT CHEM 8, ED
BUN: 21 mg/dL (ref 8–23)
Calcium, Ion: 1.25 mmol/L (ref 1.15–1.40)
Chloride: 104 mmol/L (ref 98–111)
Creatinine, Ser: 1.1 mg/dL (ref 0.61–1.24)
Glucose, Bld: 168 mg/dL — ABNORMAL HIGH (ref 70–99)
HCT: 39 % (ref 39.0–52.0)
Hemoglobin: 13.3 g/dL (ref 13.0–17.0)
Potassium: 4.5 mmol/L (ref 3.5–5.1)
Sodium: 139 mmol/L (ref 135–145)
TCO2: 29 mmol/L (ref 22–32)

## 2020-05-02 LAB — COMPREHENSIVE METABOLIC PANEL
ALT: 25 U/L (ref 0–44)
AST: 23 U/L (ref 15–41)
Albumin: 4 g/dL (ref 3.5–5.0)
Alkaline Phosphatase: 60 U/L (ref 38–126)
Anion gap: 11 (ref 5–15)
BUN: 18 mg/dL (ref 8–23)
CO2: 25 mmol/L (ref 22–32)
Calcium: 9.3 mg/dL (ref 8.9–10.3)
Chloride: 105 mmol/L (ref 98–111)
Creatinine, Ser: 1.1 mg/dL (ref 0.61–1.24)
GFR calc Af Amer: >60 mL/min (ref >60)
GFR calc non Af Amer: >60 mL/min (ref >60)
Glucose, Bld: 172 mg/dL — ABNORMAL HIGH (ref 70–99)
Potassium: 4.4 mmol/L (ref 3.5–5.1)
Sodium: 141 mmol/L (ref 135–145)
Total Bilirubin: 0.9 mg/dL (ref 0.3–1.2)
Total Protein: 7.7 g/dL (ref 6.5–8.1)

## 2020-05-02 LAB — CBC WITH DIFFERENTIAL/PLATELET
Abs Immature Granulocytes: 0.11 10*3/uL — ABNORMAL HIGH (ref 0.00–0.07)
Basophils Absolute: 0.1 10*3/uL (ref 0.0–0.1)
Basophils Relative: 0 %
Eosinophils Absolute: 0.1 10*3/uL (ref 0.0–0.5)
Eosinophils Relative: 1 %
HCT: 38.9 % — ABNORMAL LOW (ref 39.0–52.0)
Hemoglobin: 13 g/dL (ref 13.0–17.0)
Immature Granulocytes: 1 %
Lymphocytes Relative: 9 %
Lymphs Abs: 1.6 10*3/uL (ref 0.7–4.0)
MCH: 29.8 pg (ref 26.0–34.0)
MCHC: 33.4 g/dL (ref 30.0–36.0)
MCV: 89.2 fL (ref 80.0–100.0)
Monocytes Absolute: 1 10*3/uL (ref 0.1–1.0)
Monocytes Relative: 6 %
Neutro Abs: 14.9 10*3/uL — ABNORMAL HIGH (ref 1.7–7.7)
Neutrophils Relative %: 83 %
Platelets: 239 10*3/uL (ref 150–400)
RBC: 4.36 MIL/uL (ref 4.22–5.81)
RDW: 13.6 % (ref 11.5–15.5)
WBC: 17.7 10*3/uL — ABNORMAL HIGH (ref 4.0–10.5)
nRBC: 0 % (ref 0.0–0.2)

## 2020-05-02 LAB — I-STAT BETA HCG BLOOD, ED (MC, WL, AP ONLY): I-stat hCG, quantitative: <5 m[IU]/mL (ref <5)

## 2020-05-02 LAB — SARS CORONAVIRUS 2 BY RT PCR (HOSPITAL ORDER, PERFORMED IN ~~LOC~~ HOSPITAL LAB): SARS Coronavirus 2: NEGATIVE

## 2020-05-02 LAB — LACTIC ACID, PLASMA: Lactic Acid, Venous: 1.8 mmol/L (ref 0.5–1.9)

## 2020-05-02 LAB — BRAIN NATRIURETIC PEPTIDE: B Natriuretic Peptide: 47.3 pg/mL (ref 0.0–100.0)

## 2020-05-02 MED ORDER — SODIUM CHLORIDE 0.9 % IV SOLN
3.0000 g | Freq: Four times a day (QID) | INTRAVENOUS | Status: DC
  Administered 2020-05-02 – 2020-05-04 (×7): 3 g via INTRAVENOUS
  Filled 2020-05-02 (×7): qty 3
  Filled 2020-05-02 (×2): qty 8

## 2020-05-02 MED ORDER — HEPARIN SODIUM (PORCINE) 5000 UNIT/ML IJ SOLN
5000.0000 [IU] | Freq: Three times a day (TID) | INTRAMUSCULAR | Status: DC
  Administered 2020-05-02 – 2020-05-04 (×5): 5000 [IU] via SUBCUTANEOUS
  Filled 2020-05-02 (×4): qty 1

## 2020-05-02 MED ORDER — ONDANSETRON HCL 4 MG PO TABS: 4.0000 mg | ORAL_TABLET | Freq: Four times a day (QID) | ORAL | Status: DC | PRN

## 2020-05-02 MED ORDER — ACETAMINOPHEN 650 MG RE SUPP: 650.0000 mg | Freq: Four times a day (QID) | RECTAL | Status: DC | PRN

## 2020-05-02 MED ORDER — ALBUTEROL SULFATE HFA 108 (90 BASE) MCG/ACT IN AERS
2.0000 | INHALATION_SPRAY | Freq: Once | RESPIRATORY_TRACT | Status: AC
  Administered 2020-05-02: 2 via RESPIRATORY_TRACT
  Filled 2020-05-02: qty 6.7

## 2020-05-02 MED ORDER — ROSUVASTATIN CALCIUM 20 MG PO TABS
40.0000 mg | ORAL_TABLET | Freq: Every day | ORAL | Status: DC
  Administered 2020-05-02 – 2020-05-03 (×2): 40 mg via ORAL
  Filled 2020-05-02 (×2): qty 2

## 2020-05-02 MED ORDER — DULOXETINE HCL 20 MG PO CPEP: 20.0000 mg | ORAL_CAPSULE | Freq: Every day | ORAL | Status: DC

## 2020-05-02 MED ORDER — PANTOPRAZOLE SODIUM 40 MG PO TBEC
40.0000 mg | DELAYED_RELEASE_TABLET | Freq: Two times a day (BID) | ORAL | Status: DC
  Administered 2020-05-02 – 2020-05-04 (×4): 40 mg via ORAL
  Filled 2020-05-02 (×4): qty 1

## 2020-05-02 MED ORDER — SODIUM CHLORIDE 0.9 % IV BOLUS: 1000.0000 mL | Freq: Once | INTRAVENOUS | Status: DC

## 2020-05-02 MED ORDER — GABAPENTIN 300 MG PO CAPS
900.0000 mg | ORAL_CAPSULE | Freq: Three times a day (TID) | ORAL | Status: DC
  Administered 2020-05-02 – 2020-05-04 (×6): 900 mg via ORAL
  Filled 2020-05-02 (×6): qty 3

## 2020-05-02 MED ORDER — TRAMADOL HCL 50 MG PO TABS
50.0000 mg | ORAL_TABLET | Freq: Four times a day (QID) | ORAL | Status: DC | PRN
  Administered 2020-05-03 – 2020-05-04 (×4): 50 mg via ORAL
  Filled 2020-05-02 (×4): qty 1

## 2020-05-02 MED ORDER — SODIUM CHLORIDE 0.9 % IV SOLN
1.0000 g | Freq: Once | INTRAVENOUS | Status: AC
  Administered 2020-05-02: 1 g via INTRAVENOUS
  Filled 2020-05-02: qty 10

## 2020-05-02 MED ORDER — IOHEXOL 350 MG/ML SOLN
100.0000 mL | Freq: Once | INTRAVENOUS | Status: AC | PRN
  Administered 2020-05-02: 100 mL via INTRAVENOUS

## 2020-05-02 MED ORDER — ACETAMINOPHEN 325 MG PO TABS
650.0000 mg | ORAL_TABLET | Freq: Once | ORAL | Status: AC
  Administered 2020-05-02: 650 mg via ORAL
  Filled 2020-05-02: qty 2

## 2020-05-02 MED ORDER — BISACODYL 10 MG RE SUPP: 10.0000 mg | Freq: Every day | RECTAL | Status: DC | PRN

## 2020-05-02 MED ORDER — ACETAMINOPHEN 325 MG PO TABS
650.0000 mg | ORAL_TABLET | Freq: Four times a day (QID) | ORAL | Status: DC | PRN
  Administered 2020-05-03: 650 mg via ORAL
  Filled 2020-05-02: qty 2

## 2020-05-02 MED ORDER — METHYLPREDNISOLONE SODIUM SUCC 125 MG IJ SOLR: 125.0000 mg | Freq: Once | INTRAMUSCULAR | Status: DC

## 2020-05-02 MED ORDER — POLYETHYLENE GLYCOL 3350 17 G PO PACK: 17.0000 g | PACK | Freq: Every day | ORAL | Status: DC | PRN

## 2020-05-02 MED ORDER — SODIUM CHLORIDE 0.9 % IV BOLUS
1000.0000 mL | Freq: Once | INTRAVENOUS | Status: AC
  Administered 2020-05-02: 1000 mL via INTRAVENOUS

## 2020-05-02 MED ORDER — ASPIRIN EC 81 MG PO TBEC
81.0000 mg | DELAYED_RELEASE_TABLET | Freq: Every day | ORAL | Status: DC
  Administered 2020-05-03 – 2020-05-04 (×2): 81 mg via ORAL
  Filled 2020-05-02 (×2): qty 1

## 2020-05-02 MED ORDER — MORPHINE SULFATE (PF) 4 MG/ML IV SOLN
4.0000 mg | Freq: Once | INTRAVENOUS | Status: AC
  Administered 2020-05-02: 4 mg via INTRAVENOUS
  Filled 2020-05-02: qty 1

## 2020-05-02 MED ORDER — IPRATROPIUM-ALBUTEROL 0.5-2.5 (3) MG/3ML IN SOLN
3.0000 mL | Freq: Four times a day (QID) | RESPIRATORY_TRACT | Status: DC
  Administered 2020-05-03: 3 mL via RESPIRATORY_TRACT
  Filled 2020-05-02: qty 3

## 2020-05-02 MED ORDER — ONDANSETRON HCL 4 MG/2ML IJ SOLN: 4.0000 mg | Freq: Four times a day (QID) | INTRAMUSCULAR | Status: DC | PRN

## 2020-05-02 MED ORDER — INSULIN ASPART 100 UNIT/ML ~~LOC~~ SOLN
0.0000 [IU] | Freq: Three times a day (TID) | SUBCUTANEOUS | Status: DC
  Administered 2020-05-03: 5 [IU] via SUBCUTANEOUS
  Administered 2020-05-03: 2 [IU] via SUBCUTANEOUS
  Administered 2020-05-04: 8 [IU] via SUBCUTANEOUS

## 2020-05-02 MED ORDER — BISMUTH SUBSALICYLATE 262 MG/15ML PO SUSP: 30.0000 mL | ORAL | Status: DC | PRN

## 2020-05-02 MED ORDER — SODIUM CHLORIDE 0.9 % IV SOLN
500.0000 mg | INTRAVENOUS | Status: DC
  Administered 2020-05-03: 500 mg via INTRAVENOUS
  Filled 2020-05-02 (×2): qty 500

## 2020-05-02 MED ORDER — BUDESONIDE 0.5 MG/2ML IN SUSP
0.5000 mg | Freq: Two times a day (BID) | RESPIRATORY_TRACT | Status: DC
  Administered 2020-05-03: 0.5 mg via RESPIRATORY_TRACT
  Filled 2020-05-02 (×4): qty 2

## 2020-05-02 MED ORDER — AZITHROMYCIN 500 MG IV SOLR
500.0000 mg | Freq: Once | INTRAVENOUS | Status: AC
  Administered 2020-05-02: 500 mg via INTRAVENOUS
  Filled 2020-05-02: qty 500

## 2020-05-02 MED ORDER — OXYCODONE HCL 5 MG PO TABS
5.0000 mg | ORAL_TABLET | ORAL | Status: DC | PRN
  Administered 2020-05-02: 5 mg via ORAL
  Filled 2020-05-02: qty 1

## 2020-05-02 NOTE — H&P (Signed)
History and Physical    Daryl Vega WNU:[id number] DOB: Apr 07, 1946 DOA: 05/02/2020  PCP: Fransisca Connors, PA-C  Patient coming from: Home  I have personally briefly reviewed patient's old medical records in [place]  Chief Complaint: Episode of chest pain.  HPI: Daryl Vega is a 74 y.o. 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  He reports around 1 PM was trying to to put air hoses on a compressor when developed right-sided chest discomfort that went towards the back of his neck.  8/10 stabbing sensation that was worsened by deep inspiration.  No alleviating factors. No palpitations, nausea vomiting or diaphoresis.  Denies any associated dyspnea however reports productive cough with greenish phlegm/wheezing for past few months.  No fever or chills.  Chills.  Denies any orthopnea however sleeps in a chair due to discomfort.  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 Lives with wife.  2 grownup children.  Former smoker 1.5 pack/day for 40 years.  Quit in 2015.  Denies any EtOH use.  No illicit drug use reported.  Family history: Mother with CAD and died at age 72.  Sister with unknown cancer. Ted died in his sleep at age 38.  ED Course:  Vital signs in the ED with BP 129/83-143/77, heart rate 106--119 bpm, RR 19-22, SPO2 95% on 2 L nasal cannula.  T-max 100  -Labs with WBC 17.7, Hb 13, platelets 239, troponin 5 Sodium 139, potassium 4.5, BUN 21, creatinine 1.1, serum glucose 168.  Lactic acid 1.8 -Blood cultures pending -Covid PCR negative  -EKG pending at this time.  -CXR with pneumonia/atelectasis involving the peripheral right upper lobe.  CT chest recommended. -CTA PE protocol showed  no evidence of PE.  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  Increase soft tissue component along the upper right mediastinum-concern for recurrence.  Medications received. -Morphine 4 mg once -Ceftriaxone/azithromycin -Albuterol -Tylenol.   Review of Systems: As per HPI otherwise 10 point review of systems negative.    Past Medical History:  Diagnosis Date  . Anemia   . C. difficile enteritis   . Cholecystoduodenal fistula   . COPD (chronic obstructive pulmonary disease) ([place])   . DM (diabetes mellitus) ([place])   . Duodenal ulcer   . Elevated LFTs   . FH: chemotherapy   . Gastroparesis 02/09/2015  . Hypercholesteremia   . Lung cancer ([place])   . Melanoma ([place])   . Pneumonia   . Protein calorie malnutrition ([place])   . Pulmonary embolus ([place])   . Radiation 08/03/14-08/23/14   35 gray to right chest    Past Surgical History:  Procedure Laterality Date  . CHOLECYSTECTOMY    . duodenostomy tube    . ESOPHAGOGASTRODUODENOSCOPY N/A 11/30/2014   Procedure: ESOPHAGOGASTRODUODENOSCOPY (EGD);  Surgeon: [name], MD;  Location: [facility];  Service: Endoscopy;  Laterality: N/A;  . JEJUNOSTOMY FEEDING TUBE    . LAPAROTOMY N/A 11/30/2014   Procedure: EXPLORATORY LAPAROTOMY, PYLOROPLASTY, OVERSEWING OF POSTERIOR DUODENUM, DUODENOSTOMY, CHOLECYSTECTOMY, JEJEUNOSTOMY;  Surgeon: Jackolyn Confer, MD;  Location: [facility];  Service: General;  Laterality: N/A;  . PYLOROPLASTY    . VIDEO BRONCHOSCOPY Bilateral 07/20/2014   Procedure: VIDEO BRONCHOSCOPY WITHOUT FLUORO;  Surgeon: Tanda Rockers, MD;  Location: [facility];  Service:  Cardiopulmonary;  Laterality: Bilateral;     reports that he quit smoking about 5 years ago. His smoking use included cigarettes and cigars. He has a 100.00 pack-year smoking history. He quit smokeless tobacco use about 7 years ago.  His smokeless tobacco use included chew. He reports that he does not drink alcohol or use drugs.  No Known  Allergies  Family History  Problem Relation Age of Onset  . Heart disease Mother   . Cancer Mother        ? type   Review of Systems  Constitutional: Negative for chills, diaphoresis, fever, malaise/fatigue and weight loss.  HENT: Negative.   Eyes: Negative.   Respiratory: Positive for cough, sputum production, shortness of breath and wheezing. Negative for hemoptysis.   Cardiovascular: Positive for chest pain. Negative for palpitations, orthopnea, claudication, leg swelling and PND.  Gastrointestinal: Negative.   Genitourinary: Negative.   Musculoskeletal: Positive for back pain, joint pain and neck pain. Negative for falls and myalgias.  Skin: Negative.   Neurological: Negative.   Endo/Heme/Allergies: Negative.   Psychiatric/Behavioral: Negative.     Prior to Admission medications   Medication Sig Start Date End Date Taking? Authorizing Provider  atorvastatin (LIPITOR) 80 MG tablet Take 80 mg by mouth daily. Reported on 06/02/2016   Yes [provider]  clotrimazole-betamethasone (LOTRISONE) cream Apply 1 application topically 2 (two) times daily as needed (dry skin).  12/27/19  Yes [provider]  gabapentin (NEURONTIN) 300 MG capsule Take 900 mg by mouth 3 (three) times daily.  03/30/20  Yes [provider]  glipiZIDE (GLUCOTROL XL) 10 MG 24 hr tablet Take 10 mg by mouth daily. 04/04/20  Yes [provider]  JANUVIA 25 MG tablet Take 25 mg by mouth daily. 03/26/20  Yes [provider]  metFORMIN (GLUCOPHAGE) 500 MG tablet Take 500 mg by mouth in the morning, at noon, and at bedtime.  01/09/20  Yes [provider]  rosuvastatin (CRESTOR) 20 MG tablet Take 20 mg by mouth at bedtime. 04/04/20  Yes [provider]  traMADol (ULTRAM) 50 MG tablet Take 50 mg by mouth every 6 (six) hours as needed for moderate pain or severe pain.  04/21/20  Yes [provider]  acetaminophen (TYLENOL) 325 MG tablet Take 2 tablets (650 mg  total) by mouth every 6 (six) hours as needed for mild pain (or Fever >/= 101). Patient not taking: Reported on 06/02/2016 10/01/15   Robbie Lis, MD  apixaban (ELIQUIS) 5 MG TABS tablet Take 1 tablet (5 mg total) by mouth 2 (two) times daily. Patient not taking: Reported on 05/02/2020 09/18/15   Theodis Blaze, MD  bismuth subsalicylate (PEPTO BISMOL) 262 MG/15ML suspension Place 30 mLs into feeding tube every 4 (four) hours as needed for indigestion. Patient not taking: Reported on 05/02/2020 01/11/15   Rama, Venetia Maxon, MD  budesonide-formoterol [facility]) 160-4.5 MCG/ACT inhaler Take 2 puffs first thing in am and then another 2 puffs about 12 hours later. Patient not taking: Reported on 09/24/2015 01/11/15   Rama, Venetia Maxon, MD  DULoxetine (CYMBALTA) 20 MG capsule Take 1 capsule (20 mg total) by mouth daily. Patient not taking: Reported on 05/02/2020 01/11/15   Rama, Venetia Maxon, MD  feeding supplement, ENSURE ENLIVE, (ENSURE ENLIVE) LIQD Take 237 mLs by mouth 2 (two) times daily between meals. Patient not taking: Reported on 06/02/2016 10/01/15   Robbie Lis, MD  HYDROmorphone (DILAUDID) 4 MG tablet Take 1 tablet (4 mg total) by mouth  every 8 (eight) hours as needed for severe pain. Patient not taking: Reported on 06/02/2016 09/13/15   Theodis Blaze, MD  LORazepam (ATIVAN) 0.5 MG tablet Take 1 table by mouth every 12 hours as needed for anxiety Patient not taking: Reported on 06/02/2016 09/13/15   Theodis Blaze, MD  Multiple Vitamins-Iron (MULTIVITAMINS WITH IRON) TABS tablet Take 1 tablet by mouth daily after supper. Patient not taking: Reported on 05/02/2020 12/19/14   Dhungel, Flonnie Overman, MD  pantoprazole (PROTONIX) 40 MG tablet Take 1 tablet (40 mg total) by mouth 2 (two) times daily. Patient not taking: Reported on 05/02/2020 12/19/14   Dhungel, Flonnie Overman, MD  potassium chloride SA (K-DUR,KLOR-CON) 20 MEQ tablet Take 2 tablets (40 mEq total) by mouth daily. Patient not taking: Reported on  05/02/2020 12/19/14   Dhungel, Flonnie Overman, MD  prochlorperazine (COMPAZINE) 10 MG tablet Take 1 tablet (10 mg total) by mouth every 6 (six) hours as needed for nausea or vomiting. Patient not taking: Reported on 06/02/2016 02/09/15   Earnstine Regal, PA-C    Physical Exam: Vitals:   05/02/20 1800 05/02/20 1830 05/02/20 1900 05/02/20 1942  BP: (!) 143/77 137/84 121/85 129/83  Pulse: (!) 118 (!) 115 (!) 110 (!) 106  Resp: 18 18 (!) 22 19  Temp:      TempSrc:      SpO2: 95% 98% 94% 100%  Weight:      Height:        Constitutional: NAD, calm, comfortable Vitals:   05/02/20 1800 05/02/20 1830 05/02/20 1900 05/02/20 1942  BP: (!) 143/77 137/84 121/85 129/83  Pulse: (!) 118 (!) 115 (!) 110 (!) 106  Resp: 18 18 (!) 22 19  Temp:      TempSrc:      SpO2: 95% 98% 94% 100%  Weight:      Height:       Physical Exam  Constitutional: He is oriented to person, place, and time. He appears well-developed and well-nourished. No distress.  HENT:  Head: Normocephalic and atraumatic.  Right Ear: External ear normal.  Left Ear: External ear normal.  Mouth/Throat: Oropharynx is clear and moist. No oropharyngeal exudate.  Eyes: Pupils are equal, round, and reactive to light. EOM are normal. Right eye exhibits no discharge. Left eye exhibits no discharge. No scleral icterus.  Neck: No JVD present.  Cardiovascular: Regular rhythm. Exam reveals no friction rub.  No murmur heard. Tachycardic.  Pulmonary/Chest: He has wheezes. He has rales. He exhibits no tenderness.  Abdominal: Soft. Bowel sounds are normal. He exhibits no distension and no mass. There is no abdominal tenderness. There is no rebound and no guarding.  Musculoskeletal:        General: No deformity or edema. Normal range of motion.     Cervical back: Normal range of motion and neck supple.  Neurological: He is alert and oriented to person, place, and time. He exhibits normal muscle tone.  Skin: Skin is warm and dry. No rash noted. He is  not diaphoretic. No erythema. No pallor.  Psychiatric: He has a normal mood and affect. His behavior is normal. Judgment and thought content normal.  Vitals reviewed.   Labs on Admission: I have personally reviewed following labs and imaging studies  CBC: Recent Labs  Lab 05/02/20 1715 05/02/20 1857  WBC 17.7*  --   NEUTROABS 14.9*  --   HGB 13.0 13.3  HCT 38.9* 39.0  MCV 89.2  --   PLT 239  --    Basic Metabolic  Panel: Recent Labs  Lab 05/02/20 1825 05/02/20 1857  NA 141 139  K 4.4 4.5  CL 105 104  CO2 25  --   GLUCOSE 172* 168*  BUN 18 21  CREATININE 1.10 1.10  CALCIUM 9.3  --    GFR: Estimated Creatinine Clearance: 63.6 mL/min (by C-G formula based on SCr of 1.1 mg/dL). Liver Function Tests: Recent Labs  Lab 05/02/20 1825  AST 23  ALT 25  ALKPHOS 60  BILITOT 0.9  PROT 7.7  ALBUMIN 4.0   No results for input(s): LIPASE, AMYLASE in the last 168 hours. No results for input(s): AMMONIA in the last 168 hours. Coagulation Profile: No results for input(s): INR, PROTIME in the last 168 hours. Cardiac Enzymes: No results for input(s): CKTOTAL, CKMB, CKMBINDEX, TROPONINI in the last 168 hours. BNP (last 3 results) No results for input(s): PROBNP in the last 8760 hours. HbA1C: No results for input(s): HGBA1C in the last 72 hours. CBG: No results for input(s): GLUCAP in the last 168 hours. Lipid Profile: No results for input(s): CHOL, HDL, LDLCALC, TRIG, CHOLHDL, LDLDIRECT in the last 72 hours. Thyroid Function Tests: No results for input(s): TSH, T4TOTAL, FREET4, T3FREE, THYROIDAB in the last 72 hours. Anemia Panel: No results for input(s): VITAMINB12, FOLATE, FERRITIN, TIBC, IRON, RETICCTPCT in the last 72 hours. Urine analysis:    Component Value Date/Time   COLORURINE YELLOW 09/24/2015 1907   APPEARANCEUR CLOUDY (A) 09/24/2015 1907   LABSPEC 1.025 09/24/2015 1907   PHURINE 6.0 09/24/2015 1907   GLUCOSEU NEGATIVE 09/24/2015 [place] NEGATIVE  09/24/2015 1907   BILIRUBINUR MODERATE (A) 09/24/2015 1907   KETONESUR NEGATIVE 09/24/2015 1907   PROTEINUR NEGATIVE 09/24/2015 1907   UROBILINOGEN 1.0 09/24/2015 1907   NITRITE NEGATIVE 09/24/2015 1907   LEUKOCYTESUR TRACE (A) 09/24/2015 1907    Radiological Exams on Admission: CT Angio Chest PE W and/or Wo Contrast  Result Date: 05/02/2020 CLINICAL DATA:  Shortness of breath, chest pain EXAM: CT ANGIOGRAPHY CHEST WITH CONTRAST TECHNIQUE: Multidetector CT imaging of the chest was performed using the standard protocol during bolus administration of intravenous contrast. Multiplanar CT image reconstructions and MIPs were obtained to evaluate the vascular anatomy. CONTRAST:  179mL OMNIPAQUE IOHEXOL 350 MG/ML SOLN COMPARISON:  09/08/2015 FINDINGS: Cardiovascular: Satisfactory opacification of the pulmonary arteries to the segmental level. No evidence of pulmonary embolism. Normal heart size. No pericardial effusion. Mediastinum/Nodes: Mediastinum is shifted towards the right. No lymphadenopathy. Trachea is normal. Thyroid gland is normal. Esophagus demonstrates no focal abnormality. Lungs/Pleura: Right lung volume loss. 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 Increased soft tissue component along the upper medial right mediastinum extending along the apex which may reflect further posttreatment changes, but the significant increase in the soft tissue component compared with 09/08/2015 raises the concern for recurrent disease with the soft tissue component measuring 6.1 x 7.7 x 3.5 cm. Trace right pleural effusion. No pneumothorax. Upper Abdomen: No acute abnormality. Musculoskeletal: No chest wall abnormality. No acute or significant osseous findings. Review of the MIP images confirms the above findings. IMPRESSION: 1. No evidence of pulmonary embolus. 2. 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 Increased soft tissue  component along the upper medial right mediastinum extending along the apex which may reflect further posttreatment changes, but the significant increase in the soft tissue component compared with 09/08/2015 measuring 6.1 x 7.7 x 3.5 cm raises the concern for recurrent disease. If there is further clinical concern, recommend  evaluation with a PET-CT. 3.  Aortic Atherosclerosis (ICD10-I70.0). Electronically Signed   By: Kathreen Devoid   On: 05/02/2020 19:43   DG Chest Port 1 View  Result Date: 05/02/2020 CLINICAL DATA:  Acute onset of chest pain that began this afternoon while working in his yard. Personal history of RIGHT UPPER LOBE lung cancer post radiation therapy in 2015. EXAM: PORTABLE CHEST 1 VIEW COMPARISON:  10/05/2015 and earlier, including CTA chest 09/08/2015. FINDINGS: Cardiac silhouette normal in size, unchanged. Thoracic aorta mildly atherosclerotic, unchanged. 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 Consolidation involving the peripheral RIGHT UPPER LOBE, new since the 2016 examination. LEFT lung remains clear. IMPRESSION: 1. Acute pneumonia and/or atelectasis involving the peripheral RIGHT UPPER LOBE. Given the patient's personal history of RIGHT UPPER LOBE lung cancer, a central recurrence with obstruction is possible. CT of the chest with contrast is recommended in further evaluation. 2. 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 Electronically Signed   By: Evangeline Dakin M.D.   On: 05/02/2020 18:29    EKG: Independently reviewed.   Assessment/Plan Active Problems:   Sepsis ([place])   COPD mixed type (H[facility])   Type 2 diabetes, controlled, 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HCC)   Aspiration pneumonia ([place])   Chest pain in adult    #Atypical chest pain. -Aspirin, Crestor. -We will trend troponin.Telemetry. -Heart score 5.   Will need some form of ischemic evaluation. -Staff message sent to Cardiology.  -NPO for stress test in am.    #Increasing right mediastinal opacity on CT -Concern for recurrent disease.  PET recommended. -Will need follow-up with primary oncologist Dr. Alverda Skeans. -Meanwhile continue with DuoNebs/Advair for COPD given wheezing. -Unasyn/azithromycin to cover aspiration/atypical pneumonia. -Oncology called from the ED.  Dr. Alen Blew will see patient in a.m.  #Sepsis WBC17K , HR 120 spO2 89% -? Post obstructive PNA -Unasyn   #GERD/history of perforating PUD -Continue with PPI  #DM -Hold Januvia, Metformin and glipizide. -Sliding scale insulin.  #Chronic pain/neuropathy. -As needed tramadol and gabapentin.   DVT prophylaxis: SubQ heparin.   Code Status: Full code Family Communication: Patient Disposition Plan: Home versus home PT.    Consults called: Oncology, Dr. Alen Blew. Admission status: Inpatient.   Firman Petrow MD [facility]   If 7PM-7AM, please contact night-coverage [email or web address] Password [facility]  05/02/2020, 8:17 PM

## 2020-05-02 NOTE — ED Triage Notes (Signed)
[facility] transferred pt from home to [facility] ED and reports the following:  Pt was outside doing yard work. Around 14:00 central chest pain started. Hurt through to his back into neck. Pt rates pain 8/10. Sinus tach with EMS. No cardiac hx. Hx DM, neuropathy.

## 2020-05-02 NOTE — ED Provider Notes (Signed)
[facility] DEPT Provider Note   CSN: [id number] Arrival date & time: 05/02/20  1647     History Chief Complaint  Patient presents with  . Chest Pain    Story Daryl Vega is a 74 y.o. male history of COPD, previous PE, lung cancer in remission, who presented with shortness of breath, chest pain.  Patient states that he has been having shortness of breath last several days.  He states that he was working in the yard and had sudden onset of chest pain and worsening shortness of breath.  Has some chills as well but denies any actual fevers.  He states that the pain radiates to his neck and his back as well.  He was noted to be tachycardic by EMS.  His oxygen level was 89 to 92% per EMS.  He states that he has a history of COPD but he is not on oxygen at home.  He has not been smoking for the last several years.  He has a history of PE but no longer on blood thinners.  He states that he had both of his Covid shots.  He has no known contacts with Covid.  The history is provided by the patient.       Past Medical History:  Diagnosis Date  . Anemia   . C. difficile enteritis   . Cholecystoduodenal fistula   . COPD (chronic obstructive pulmonary disease) ([place])   . DM (diabetes mellitus) ([place])   . Duodenal ulcer   . Elevated LFTs   . FH: chemotherapy   . Gastroparesis 02/09/2015  . Hypercholesteremia   . Lung cancer ([place])   . Melanoma ([place])   . Pneumonia   . Protein calorie malnutrition ([place])   . Pulmonary embolus ([place])   . Radiation 08/03/14-08/23/14   35 gray to right chest    Patient Active Problem List   Diagnosis Date Noted  . Hallucination 09/27/2015  . Antineoplastic chemotherapy induced anemia 09/26/2015  . Anemia of chronic disease 09/26/2015  . Dyslipidemia associated with type 2 diabetes mellitus ([place]) 09/26/2015  . 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 Polypharmacy 09/25/2015  . Acute encephalopathy  09/25/2015  . Recurrent falls 09/25/2015  . Pulmonary embolism ([place]) 01/06/2015  . Enteritis due to Clostridium difficile 12/19/2014  . Protein-calorie malnutrition, severe ([place]) 11/30/2014  . 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 11/30/2014  . Bleeding duodenal ulcer s/p ex lap/oversew 11/30/2014 11/30/2014  . Hypokalemia 10/25/2014  . Lung cancer ([place]) 07/28/2014  . 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 COPD mixed type ([place]) 06/28/2014    Past Surgical History:  Procedure Laterality Date  . CHOLECYSTECTOMY    . duodenostomy tube    . ESOPHAGOGASTRODUODENOSCOPY N/A 11/30/2014   Procedure: ESOPHAGOGASTRODUODENOSCOPY (EGD);  Surgeon: [name], MD;  Location: [facility];  Service: Endoscopy;  Laterality: N/A;  . JEJUNOSTOMY FEEDING TUBE    . LAPAROTOMY N/A 11/30/2014   Procedure: EXPLORATORY LAPAROTOMY, PYLOROPLASTY, OVERSEWING OF POSTERIOR DUODENUM, DUODENOSTOMY, CHOLECYSTECTOMY, JEJEUNOSTOMY;  Surgeon: Jackolyn Confer, MD;  Location: [facility];  Service: General;  Laterality: N/A;  . PYLOROPLASTY    . VIDEO BRONCHOSCOPY Bilateral 07/20/2014   Procedure: VIDEO BRONCHOSCOPY WITHOUT FLUORO;  Surgeon: Tanda Rockers, MD;  Location: [facility];  Service: Cardiopulmonary;  Laterality: Bilateral;       Family History  Problem Relation Age of Onset  . Heart disease Mother   .  Cancer Mother        ? type    Social History   Tobacco Use  . Smoking status: Former Smoker    Packs/day: 2.00    Years: 50.00    Pack years: 100.00    Types: Cigarettes, Cigars    Quit date: 06/12/2014    Years since quitting: 5.8  . Smokeless tobacco: Former Systems developer    Types: Chew    Quit date: 09/07/2012  Substance Use Topics  . Alcohol use: No  . Drug use: No    Home Medications Prior to Admission medications   Medication Sig Start Date End Date Taking? Authorizing Provider  atorvastatin (LIPITOR) 80 MG tablet Take  80 mg by mouth daily. Reported on 06/02/2016   Yes [provider]  clotrimazole-betamethasone (LOTRISONE) cream Apply 1 application topically 2 (two) times daily as needed (dry skin).  12/27/19  Yes [provider]  gabapentin (NEURONTIN) 300 MG capsule Take 900 mg by mouth 3 (three) times daily.  03/30/20  Yes [provider]  glipiZIDE (GLUCOTROL XL) 10 MG 24 hr tablet Take 10 mg by mouth daily. 04/04/20  Yes [provider]  JANUVIA 25 MG tablet Take 25 mg by mouth daily. 03/26/20  Yes [provider]  metFORMIN (GLUCOPHAGE) 500 MG tablet Take 500 mg by mouth in the morning, at noon, and at bedtime.  01/09/20  Yes [provider]  rosuvastatin (CRESTOR) 20 MG tablet Take 20 mg by mouth at bedtime. 04/04/20  Yes [provider]  traMADol (ULTRAM) 50 MG tablet Take 50 mg by mouth every 6 (six) hours as needed for moderate pain or severe pain.  04/21/20  Yes [provider]  acetaminophen (TYLENOL) 325 MG tablet Take 2 tablets (650 mg total) by mouth every 6 (six) hours as needed for mild pain (or Fever >/= 101). Patient not taking: Reported on 06/02/2016 10/01/15   Robbie Lis, MD  apixaban (ELIQUIS) 5 MG TABS tablet Take 1 tablet (5 mg total) by mouth 2 (two) times daily. Patient not taking: Reported on 05/02/2020 09/18/15   Theodis Blaze, MD  bismuth subsalicylate (PEPTO BISMOL) 262 MG/15ML suspension Place 30 mLs into feeding tube every 4 (four) hours as needed for indigestion. Patient not taking: Reported on 05/02/2020 01/11/15   Rama, Venetia Maxon, MD  budesonide-formoterol [facility]) 160-4.5 MCG/ACT inhaler Take 2 puffs first thing in am and then another 2 puffs about 12 hours later. Patient not taking: Reported on 09/24/2015 01/11/15   Rama, Venetia Maxon, MD  DULoxetine (CYMBALTA) 20 MG capsule Take 1 capsule (20 mg total) by mouth daily. Patient not taking: Reported on 05/02/2020 01/11/15   Rama, Venetia Maxon, MD  feeding  supplement, ENSURE ENLIVE, (ENSURE ENLIVE) LIQD Take 237 mLs by mouth 2 (two) times daily between meals. Patient not taking: Reported on 06/02/2016 10/01/15   Robbie Lis, MD  HYDROmorphone (DILAUDID) 4 MG tablet Take 1 tablet (4 mg total) by mouth every 8 (eight) hours as needed for severe pain. Patient not taking: Reported on 06/02/2016 09/13/15   Theodis Blaze, MD  LORazepam (ATIVAN) 0.5 MG tablet Take 1 table by mouth every 12 hours as needed for anxiety Patient not taking: Reported on 06/02/2016 09/13/15   Theodis Blaze, MD  Multiple Vitamins-Iron (MULTIVITAMINS WITH IRON) TABS tablet Take 1 tablet by mouth daily after supper. Patient not taking: Reported on 05/02/2020 12/19/14   Dhungel, Flonnie Overman, MD  pantoprazole (PROTONIX) 40 MG tablet Take 1 tablet (40  mg total) by mouth 2 (two) times daily. Patient not taking: Reported on 05/02/2020 12/19/14   Dhungel, Flonnie Overman, MD  potassium chloride SA (K-DUR,KLOR-CON) 20 MEQ tablet Take 2 tablets (40 mEq total) by mouth daily. Patient not taking: Reported on 05/02/2020 12/19/14   Dhungel, Flonnie Overman, MD  prochlorperazine (COMPAZINE) 10 MG tablet Take 1 tablet (10 mg total) by mouth every 6 (six) hours as needed for nausea or vomiting. Patient not taking: Reported on 06/02/2016 02/09/15   Earnstine Regal, PA-C    Allergies    Patient has no known allergies.  Review of Systems   Review of Systems  Respiratory: Positive for cough and shortness of breath.   All other systems reviewed and are negative.   Physical Exam Updated Vital Signs BP 129/83   Pulse (!) 106   Temp 100 F (37.8 C) (Oral)   Resp 19   Ht 5\' 8"  (1.727 m)   Wt 82.6 kg   SpO2 100%   BMI 27.67 kg/m   Physical Exam Vitals and nursing note reviewed.  Constitutional:      Comments: tachypneic   HENT:     Head: Normocephalic.  Eyes:     Pupils: Pupils are equal, round, and reactive to light.  Cardiovascular:     Rate and Rhythm: Regular rhythm. Tachycardia present.     Heart  sounds: Normal heart sounds.  Pulmonary:     Comments: Diminished breath sounds on the right and crackles on the left base.  Mild retractions and no wheezing. Abdominal:     General: Bowel sounds are normal.     Palpations: Abdomen is soft.  Musculoskeletal:        General: Normal range of motion.     Cervical back: Normal range of motion and neck supple.  Skin:    General: Skin is warm.     Capillary Refill: Capillary refill takes less than 2 seconds.  Neurological:     General: No focal deficit present.     Mental Status: He is oriented to person, place, and time.  Psychiatric:        Mood and Affect: Mood normal.        Behavior: Behavior normal.     ED Results / Procedures / Treatments   Labs (all labs ordered are listed, but only abnormal results are displayed) Labs Reviewed  CBC WITH DIFFERENTIAL/PLATELET - Abnormal; Notable for the following components:      Result Value   WBC 17.7 (*)    HCT 38.9 (*)    Neutro Abs 14.9 (*)    Abs Immature Granulocytes 0.11 (*)    All other components within normal limits  COMPREHENSIVE METABOLIC PANEL - Abnormal; Notable for the following components:   Glucose, Bld 172 (*)    All other components within normal limits  I-STAT CHEM 8, ED - Abnormal; Notable for the following components:   Glucose, Bld 168 (*)    All other components within normal limits  SARS CORONAVIRUS 2 BY RT PCR (HOSPITAL ORDER, [facility])  CULTURE, BLOOD (ROUTINE X 2)  CULTURE, BLOOD (ROUTINE X 2)  LACTIC ACID, PLASMA  I-STAT CHEM 8, ED  I-STAT BETA HCG BLOOD, ED ([facility], [facility], [facility] ONLY)  TROPONIN I (HIGH SENSITIVITY)  TROPONIN I (HIGH SENSITIVITY)    EKG None  ED ECG REPORT   Date: 05/02/2020  Rate: 121  Rhythm: normal sinus rhythm  QRS Axis: normal  Intervals: normal  ST/T Wave abnormalities: nonspecific ST changes  Conduction Disutrbances:none  Narrative Interpretation:   Old EKG Reviewed: rate faster  I have personally  reviewed the EKG tracing and agree with the computerized printout as noted.   Radiology CT Angio Chest PE W and/or Wo Contrast  Result Date: 05/02/2020 CLINICAL DATA:  Shortness of breath, chest pain EXAM: CT ANGIOGRAPHY CHEST WITH CONTRAST TECHNIQUE: Multidetector CT imaging of the chest was performed using the standard protocol during bolus administration of intravenous contrast. Multiplanar CT image reconstructions and MIPs were obtained to evaluate the vascular anatomy. CONTRAST:  158mL OMNIPAQUE IOHEXOL 350 MG/ML SOLN COMPARISON:  09/08/2015 FINDINGS: Cardiovascular: Satisfactory opacification of the pulmonary arteries to the segmental level. No evidence of pulmonary embolism. Normal heart size. No pericardial effusion. Mediastinum/Nodes: Mediastinum is shifted towards the right. No lymphadenopathy. Trachea is normal. Thyroid gland is normal. Esophagus demonstrates no focal abnormality. Lungs/Pleura: Right lung volume loss. 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 Increased soft tissue component along the upper medial right mediastinum extending along the apex which may reflect further posttreatment changes, but the significant increase in the soft tissue component compared with 09/08/2015 raises the concern for recurrent disease with the soft tissue component measuring 6.1 x 7.7 x 3.5 cm. Trace right pleural effusion. No pneumothorax. Upper Abdomen: No acute abnormality. Musculoskeletal: No chest wall abnormality. No acute or significant osseous findings. Review of the MIP images confirms the above findings. IMPRESSION: 1. No evidence of pulmonary embolus. 2. 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 Increased soft tissue component along the upper medial right mediastinum extending along the apex which may reflect further posttreatment changes, but the significant increase in the soft tissue component compared with  09/08/2015 measuring 6.1 x 7.7 x 3.5 cm raises the concern for recurrent disease. If there is further clinical concern, recommend evaluation with a PET-CT. 3.  Aortic Atherosclerosis (ICD10-I70.0). Electronically Signed   By: Kathreen Devoid   On: 05/02/2020 19:43   DG Chest Port 1 View  Result Date: 05/02/2020 CLINICAL DATA:  Acute onset of chest pain that began this afternoon while working in his yard. Personal history of RIGHT UPPER LOBE lung cancer post radiation therapy in 2015. EXAM: PORTABLE CHEST 1 VIEW COMPARISON:  10/05/2015 and earlier, including CTA chest 09/08/2015. FINDINGS: Cardiac silhouette normal in size, unchanged. Thoracic aorta mildly atherosclerotic, unchanged. 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 Consolidation involving the peripheral RIGHT UPPER LOBE, new since the 2016 examination. LEFT lung remains clear. IMPRESSION: 1. Acute pneumonia and/or atelectasis involving the peripheral RIGHT UPPER LOBE. Given the patient's personal history of RIGHT UPPER LOBE lung cancer, a central recurrence with obstruction is possible. CT of the chest with contrast is recommended in further evaluation. 2. 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 Electronically Signed   By: Evangeline Dakin M.D.   On: 05/02/2020 18:29    Procedures Procedures (including critical care time)    Medications Ordered in ED Medications  azithromycin (ZITHROMAX) 500 mg in sodium chloride 0.9 % 250 mL IVPB (500 mg Intravenous New Bag/Given 05/02/20 1942)  methylPREDNISolone sodium succinate (SOLU-MEDROL) 125 mg/2 mL injection 125 mg (has no administration in time range)  acetaminophen (TYLENOL) tablet 650 mg (650 mg Oral Given 05/02/20 1746)  sodium chloride 0.9 % bolus 1,000 mL (0 mLs Intravenous Stopped 05/02/20 1847)  albuterol (VENTOLIN HFA) 108 (90  Base) MCG/ACT inhaler 2  puff (2 puffs Inhalation Given 05/02/20 1746)  morphine 4 MG/ML injection 4 mg (4 mg Intravenous Given 05/02/20 1851)  cefTRIAXone (ROCEPHIN) 1 g in sodium chloride 0.9 % 100 mL IVPB (0 g Intravenous Stopped 05/02/20 1920)  sodium chloride 0.9 % bolus 1,000 mL (1,000 mLs Intravenous New Bag/Given 05/02/20 1847)  iohexol (OMNIPAQUE) 350 MG/ML injection 100 mL (100 mLs Intravenous Contrast Given 05/02/20 1921)    ED Course  I have reviewed the triage vital signs and the nursing notes.  Pertinent labs & imaging results that were available during my care of the patient were reviewed by me and considered in my medical decision making (see chart for details).    MDM Rules/Calculators/A&P                      Daryl Vega is a 74 y.o. male who presented with shortness of breath, chest pain.  I think likely pneumonia versus PE versus recurrent cancer versus Covid versus COPD. Will get cbc, cmp, trop, COVID, CXR, CT PE. Will give IVF and albuterol and likely need admission.   8:23 PM Labs showed white count of 17.  Chest x-ray showed pneumonia or atelectasis of the right upper lobe.  CTA showed fibrosis and possible recurrent disease.  Patient is hypoxic to 89% and on 2 L, his oxygen went up to 95% .  Given IV antibiotics and hospitalist to admit for pneumonia, possible recurrent cancer.  I discussed case with oncologist, Dr. Alen Blew.  He states that at this point, patient just needs medical management and since he has been followed up at [facility], he can either follow-up there or he can follow with a local oncologist.   Final Clinical Impression(s) / ED Diagnoses Final diagnoses:  None    Rx / DC Orders ED Discharge Orders    None       Drenda Freeze, MD 05/02/20 2034

## 2020-05-03 ENCOUNTER — Inpatient Hospital Stay (HOSPITAL_COMMUNITY): Payer: Medicare Other

## 2020-05-03 DIAGNOSIS — E782 Mixed hyperlipidemia: Secondary | ICD-10-CM

## 2020-05-03 DIAGNOSIS — Z8249 Family history of ischemic heart disease and other diseases of the circulatory system: Secondary | ICD-10-CM

## 2020-05-03 DIAGNOSIS — R079 Chest pain, unspecified: Secondary | ICD-10-CM

## 2020-05-03 DIAGNOSIS — E1142 Type 2 diabetes mellitus with diabetic polyneuropathy: Secondary | ICD-10-CM

## 2020-05-03 DIAGNOSIS — I10 Essential (primary) hypertension: Secondary | ICD-10-CM

## 2020-05-03 DIAGNOSIS — J449 Chronic obstructive pulmonary disease, unspecified: Secondary | ICD-10-CM

## 2020-05-03 LAB — CBC WITH DIFFERENTIAL/PLATELET
Abs Immature Granulocytes: 0.07 10*3/uL (ref 0.00–0.07)
Basophils Absolute: 0 10*3/uL (ref 0.0–0.1)
Basophils Relative: 0 %
Eosinophils Absolute: 0.1 10*3/uL (ref 0.0–0.5)
Eosinophils Relative: 1 %
HCT: 36.8 % — ABNORMAL LOW (ref 39.0–52.0)
Hemoglobin: 11.7 g/dL — ABNORMAL LOW (ref 13.0–17.0)
Immature Granulocytes: 1 %
Lymphocytes Relative: 15 %
Lymphs Abs: 2 10*3/uL (ref 0.7–4.0)
MCH: 28.9 pg (ref 26.0–34.0)
MCHC: 31.8 g/dL (ref 30.0–36.0)
MCV: 90.9 fL (ref 80.0–100.0)
Monocytes Absolute: 1.4 10*3/uL — ABNORMAL HIGH (ref 0.1–1.0)
Monocytes Relative: 10 %
Neutro Abs: 9.8 10*3/uL — ABNORMAL HIGH (ref 1.7–7.7)
Neutrophils Relative %: 73 %
Platelets: 155 10*3/uL (ref 150–400)
RBC: 4.05 MIL/uL — ABNORMAL LOW (ref 4.22–5.81)
RDW: 13.7 % (ref 11.5–15.5)
WBC: 13.3 10*3/uL — ABNORMAL HIGH (ref 4.0–10.5)
nRBC: 0 % (ref 0.0–0.2)

## 2020-05-03 LAB — ECHOCARDIOGRAM COMPLETE
Height: 68 in
Weight: 2912 oz

## 2020-05-03 LAB — TROPONIN I (HIGH SENSITIVITY): Troponin I (High Sensitivity): 6 ng/L (ref <18)

## 2020-05-03 LAB — COMPREHENSIVE METABOLIC PANEL
ALT: 20 U/L (ref 0–44)
AST: 19 U/L (ref 15–41)
Albumin: 3.5 g/dL (ref 3.5–5.0)
Alkaline Phosphatase: 55 U/L (ref 38–126)
Anion gap: 5 (ref 5–15)
BUN: 14 mg/dL (ref 8–23)
CO2: 27 mmol/L (ref 22–32)
Calcium: 8.3 mg/dL — ABNORMAL LOW (ref 8.9–10.3)
Chloride: 105 mmol/L (ref 98–111)
Creatinine, Ser: 0.94 mg/dL (ref 0.61–1.24)
GFR calc Af Amer: >60 mL/min (ref >60)
GFR calc non Af Amer: >60 mL/min (ref >60)
Glucose, Bld: 100 mg/dL — ABNORMAL HIGH (ref 70–99)
Potassium: 3.8 mmol/L (ref 3.5–5.1)
Sodium: 137 mmol/L (ref 135–145)
Total Bilirubin: 1.1 mg/dL (ref 0.3–1.2)
Total Protein: 6.6 g/dL (ref 6.5–8.1)

## 2020-05-03 LAB — HEMOGLOBIN A1C
Hgb A1c MFr Bld: 8.3 % — ABNORMAL HIGH (ref 4.8–5.6)
Mean Plasma Glucose: 191.51 mg/dL

## 2020-05-03 LAB — TSH: TSH: 0.868 u[IU]/mL (ref 0.350–4.500)

## 2020-05-03 LAB — LIPID PANEL
Cholesterol: 75 mg/dL (ref 0–200)
HDL: 37 mg/dL — ABNORMAL LOW (ref >40)
LDL Cholesterol: 24 mg/dL (ref 0–99)
Total CHOL/HDL Ratio: 2 RATIO
Triglycerides: 70 mg/dL (ref <150)
VLDL: 14 mg/dL (ref 0–40)

## 2020-05-03 LAB — GLUCOSE, CAPILLARY
Glucose-Capillary: 116 mg/dL — ABNORMAL HIGH (ref 70–99)
Glucose-Capillary: 124 mg/dL — ABNORMAL HIGH (ref 70–99)
Glucose-Capillary: 245 mg/dL — ABNORMAL HIGH (ref 70–99)
Glucose-Capillary: 222 mg/dL — ABNORMAL HIGH (ref 70–99)

## 2020-05-03 MED ORDER — SODIUM CHLORIDE 0.9 % IV SOLN
INTRAVENOUS | Status: DC | PRN
  Administered 2020-05-03: 250 mL via INTRAVENOUS

## 2020-05-03 MED ORDER — LEVALBUTEROL HCL 0.63 MG/3ML IN NEBU: 0.6300 mg | INHALATION_SOLUTION | Freq: Four times a day (QID) | RESPIRATORY_TRACT | Status: DC | PRN

## 2020-05-03 MED ORDER — LEVALBUTEROL HCL 0.63 MG/3ML IN NEBU
0.6300 mg | INHALATION_SOLUTION | Freq: Two times a day (BID) | RESPIRATORY_TRACT | Status: DC
  Administered 2020-05-03: 0.63 mg via RESPIRATORY_TRACT
  Filled 2020-05-03 (×2): qty 3

## 2020-05-03 MED ORDER — PERFLUTREN LIPID MICROSPHERE
1.0000 mL | INTRAVENOUS | Status: AC | PRN
  Filled 2020-05-03: qty 10

## 2020-05-03 NOTE — Progress Notes (Signed)
Spoke with Dr. Meda Coffee and will plain for Myoview stress test. Nuc med unable to do today. Will make NPO for stress test tomorrow.   Raihana Balderrama Kathlen Mody, PA-C

## 2020-05-03 NOTE — Progress Notes (Signed)
  Echocardiogram 2D Echocardiogram has been performed.  Daryl Vega 05/03/2020, 2:37 PM

## 2020-05-03 NOTE — Progress Notes (Signed)
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 In ED tachycardic 106-119, respiration 19-22 SPO2 95% on 2 L, T-max 100.  Blood work showed leukocytosis 17.7, lactic acid 1.8 blood culture sent, Covid PCR negative, chest x-ray pneumonia/atelectasis involving right peripheral right upper lobe, CT A chest negative for PE but postradiation changes and increased soft tissue component along the upper right mediastinum concern for recurrence.  Patient was given ceftriaxone, azithromycin in ED, was subsequently admitted on Unasyn and azithromycin.  Subjective: No more chest pain. C/o pain in neck/hand Overnight T-max 100, respiration slightly tachycardic otherwise vitals are stable. Labs with improving WBC count.  Assessment & Plan:  Sepsis/?postobstructive pneumonia POA: presented with leukocytosis 17,000, heart rate 120, SPO2 90% respiration 20 , concern for postobstructive pneumonia: Currently vitals stable leukocytosis improving, continue Unasyn and azithromycin..  Chest pain, ACS ruled out with serial negative troponin.  Could be secondary to patient's abnormal CT finding with increasing soft tissue mass.  Cardio has been consulted on admission.  Continue aspirin, Crestor.  Hx of NSCLC Dx in 2015 s/p chemo/radiation currently in remission: CTA shows increasing soft tissue component along the upper medial right mediastinum extending along the apex which may reflect  further posttreatment changes but the significant increase in the soft tissue component compared with September 2016 measuring 6.1 X7.7X 3.7 raises  concern for recurrent disease and oncology has been consulted.  Continue IV antibiotics bronchodilators treatment.  COPD mixed type: Continue on current bronchodilators nebs antibiotics as above.  Type 2 diabetes, controlled, with peripheral neuropathy: hba1c 5.7 in 2016.  Continue sliding scale insulin.  Hold home medication.  New tramadol Neurontin Recent Labs  Lab 05/02/20 2259 05/03/20 0735  GLUCAP 120* 116*   GERD/history of perforating PUD, continue PPI  Chronic pain/neuropathy followed up at pain clinic continue tramadol as needed and Neurontin.  DVT prophylaxis:Heparin Code Status:full Family Communication: plan of care discussed with patient at bedside.  Status is: Inpatient Remains inpatient appropriate because:IV treatments appropriate due to intensity of illness or inability to take PO, Inpatient level of care appropriate due to severity of illness and For ongoing management of postobstructive pneumonia, specialist evaluation by cardiology.  Dispo: The patient is from: Home              Anticipated d/c is to: Home              Anticipated d/c date is: 1 day              Patient currently is not medically stable to d/c. Diet Order            Diet NPO time specified  Diet effective midnight              Body mass index is 27.67 kg/m.  Consultants:see note  Procedures:see note Microbiology:see note  Medications: Scheduled Meds: . aspirin EC  81 mg Oral Daily  . budesonide  0.5 mg Nebulization BID  . gabapentin  900 mg  Oral TID  . heparin  5,000 Units Subcutaneous Q8H  . insulin aspart  0-15 Units Subcutaneous TID WC  . ipratropium-albuterol  3 mL Nebulization QID  . pantoprazole  40 mg Oral BID  . rosuvastatin  40 mg Oral QHS   Continuous Infusions: . ampicillin-sulbactam (UNASYN) IV 3 g (05/03/20 0407)  .  azithromycin (ZITHROMAX) 500 MG IVPB (Vial-Mate [employer])      Antimicrobials: Anti-infectives (From admission, onward)   Start     Dose/Rate Route Frequency Ordered Stop   05/03/20 1800  azithromycin (ZITHROMAX) 500 mg in sodium chloride 0.9 % 250 mL IVPB     500 mg 250 mL/hr over 60 Minutes Intravenous Every 24 hours 05/02/20 2032     05/02/20 2200  Ampicillin-Sulbactam (UNASYN) 3 g in sodium chloride 0.9 % 100 mL IVPB     3 g 200 mL/hr over 30 Minutes Intravenous Every 6 hours 05/02/20 2032     05/02/20 1830  cefTRIAXone (ROCEPHIN) 1 g in sodium chloride 0.9 % 100 mL IVPB     1 g 200 mL/hr over 30 Minutes Intravenous  Once 05/02/20 1825 05/02/20 1920   05/02/20 1830  azithromycin (ZITHROMAX) 500 mg in sodium chloride 0.9 % 250 mL IVPB     500 mg 250 mL/hr over 60 Minutes Intravenous  Once 05/02/20 1825 05/02/20 2042       Objective: Vitals: Today's Vitals   05/03/20 0438 05/03/20 0734 05/03/20 0807 05/03/20 0907  BP: 115/68     Pulse: 93 97    Resp: (!) 22 20    Temp: 99.6 F (37.6 C)     TempSrc: Oral     SpO2: 96% 93%    Weight:      Height:      PainSc:   5  5     Intake/Output Summary (Last 24 hours) at 05/03/2020 1029 Last data filed at 05/03/2020 0944 Gross per 24 hour  Intake 1334.54 ml  Output 300 ml  Net 1034.54 ml   Filed Weights   05/02/20 1659  Weight: 82.6 kg   Weight change:    Intake/Output from previous day: 05/19 0701 - 05/20 0700 In: 1334.5 [IV Piggyback:1334.5] Out: 300 [Urine:300] Intake/Output this shift: No intake/output data recorded.  Examination:  General exam: AAOX3 ,NAD, weak appearing. HEENT:Oral mucosa moist, Ear/Nose WNL grossly,dentition normal. Respiratory system: bilaterally basal crackles,,no use of accessory muscle, non tender. Cardiovascular system: S1 & S2 +, regular, No JVD. Gastrointestinal system: Abdomen soft, NT,ND, BS+. Nervous System:Alert, awake, moving extremities and grossly nonfocal Extremities: No  edema, distal peripheral pulses palpable.  Skin: No rashes,no icterus. MSK: Normal muscle bulk,tone, power  Data Reviewed: I have personally reviewed following labs and imaging studies CBC: Recent Labs  Lab 05/02/20 1715 05/02/20 1857 05/03/20 0510  WBC 17.7*  --  13.3*  NEUTROABS 14.9*  --  9.8*  HGB 13.0 13.3 11.7*  HCT 38.9* 39.0 36.8*  MCV 89.2  --  90.9  PLT 239  --  725   Basic Metabolic Panel: Recent Labs  Lab 05/02/20 1825 05/02/20 1857 05/03/20 0510  NA 141 139 137  K 4.4 4.5 3.8  CL 105 104 105  CO2 25  --  27  GLUCOSE 172* 168* 100*  BUN 18 21 14   CREATININE 1.10 1.10 0.94  CALCIUM 9.3  --  8.3*   GFR: Estimated Creatinine Clearance: 74.5 mL/min (by C-G formula based on SCr of 0.94 mg/dL). Liver Function Tests: Recent Labs  Lab 05/02/20 1825 05/03/20  0510  AST 23 19  ALT 25 20  ALKPHOS 60 55  BILITOT 0.9 1.1  PROT 7.7 6.6  ALBUMIN 4.0 3.5   No results for input(s): LIPASE, AMYLASE in the last 168 hours. No results for input(s): AMMONIA in the last 168 hours. Coagulation Profile: No results for input(s): INR, PROTIME in the last 168 hours. Cardiac Enzymes: No results for input(s): CKTOTAL, CKMB, CKMBINDEX, TROPONINI in the last 168 hours. BNP (last 3 results) No results for input(s): PROBNP in the last 8760 hours. HbA1C: No results for input(s): HGBA1C in the last 72 hours. CBG: Recent Labs  Lab 05/02/20 2259 05/03/20 0735  GLUCAP 120* 116*   Lipid Profile: Recent Labs    05/03/20 0510  CHOL 75  HDL 37*  LDLCALC 24  TRIG 70  CHOLHDL 2.0   Thyroid Function Tests: Recent Labs    05/03/20 0912  TSH 0.868   Anemia Panel: No results for input(s): VITAMINB12, FOLATE, FERRITIN, TIBC, IRON, RETICCTPCT in the last 72 hours. Sepsis Labs: Recent Labs  Lab 05/02/20 1715  LATICACIDVEN 1.8    Recent Results (from the past 240 hour(s))  Blood culture (routine x 2)     Status: None (Preliminary result)   Collection Time: 05/02/20   5:30 PM   Specimen: BLOOD LEFT FOREARM  Result Value Ref Range Status   Specimen Description   Final    BLOOD LEFT FOREARM Performed at [facility] [address], [place], [place] [address]    Special Requests   Final    BOTTLES DRAWN AEROBIC AND ANAEROBIC Blood Culture adequate volume Performed at [facility] [address], [place], [place] [address]    Culture   Final    NO GROWTH < 12 HOURS Performed at [facility] [address], [place], [place] [address]    Report Status PENDING  Incomplete  Blood culture (routine x 2)     Status: None (Preliminary result)   Collection Time: 05/02/20  5:35 PM   Specimen: BLOOD RIGHT FOREARM  Result Value Ref Range Status   Specimen Description   Final    BLOOD RIGHT FOREARM Performed at [facility] [address], [place], [place] [address]    Special Requests   Final    BOTTLES DRAWN AEROBIC AND ANAEROBIC Blood Culture adequate volume Performed at [facility] [address] [place], [place] [address]    Culture   Final    NO GROWTH < 12 HOURS Performed at [facility] [address], [place], [place] [address]    Report Status PENDING  Incomplete  SARS Coronavirus 2 by RT PCR (hospital order, performed in [facility] hospital lab) Nasopharyngeal Nasopharyngeal Swab     Status: None   Collection Time: 05/02/20  5:37 PM   Specimen: Nasopharyngeal Swab  Result Value Ref Range Status   SARS Coronavirus 2 NEGATIVE NEGATIVE Final    Comment: (NOTE) SARS-CoV-2 target nucleic acids are NOT DETECTED. The SARS-CoV-2 RNA is generally detectable in upper and lower respiratory specimens during the acute phase of infection. The lowest concentration of SARS-CoV-2 viral copies this assay can detect is 250 copies / mL. A negative result does not preclude SARS-CoV-2 infection and should not be used as the sole basis for treatment or other patient  management decisions.  A negative result may occur with improper specimen collection / handling, submission of specimen other than nasopharyngeal swab, presence of viral mutation(s) within  the areas targeted by this assay, and inadequate number of viral copies (<250 copies / mL). A negative result must be combined with clinical observations, patient history, and epidemiological information. Fact Sheet for Patients:   StrictlyIdeas.no Fact Sheet for Healthcare Providers: BankingDealers.co.za This test is not yet approved or cleared  by the [place] FDA and has been authorized for detection and/or diagnosis of SARS-CoV-2 by FDA under an Emergency Use Authorization (EUA).  This EUA will remain in effect (meaning this test can be used) for the duration of the COVID-19 declaration under Section 564(b)(1) of the Act, 21 U.S.C. section 360bbb-3(b)(1), unless the authorization is terminated or revoked sooner. Performed at [facility], [place] [address], [place], [place] [address]       Radiology Studies: CT Angio Chest PE W and/or Wo Contrast  Result Date: 05/02/2020 CLINICAL DATA:  Shortness of breath, chest pain EXAM: CT ANGIOGRAPHY CHEST WITH CONTRAST TECHNIQUE: Multidetector CT imaging of the chest was performed using the standard protocol during bolus administration of intravenous contrast. Multiplanar CT image reconstructions and MIPs were obtained to evaluate the vascular anatomy. CONTRAST:  117mL OMNIPAQUE IOHEXOL 350 MG/ML SOLN COMPARISON:  09/08/2015 FINDINGS: Cardiovascular: Satisfactory opacification of the pulmonary arteries to the segmental level. No evidence of pulmonary embolism. Normal heart size. No pericardial effusion. Mediastinum/Nodes: Mediastinum is shifted towards the right. No lymphadenopathy. Trachea is normal. Thyroid gland is normal. Esophagus demonstrates no focal abnormality. Lungs/Pleura: Right lung  volume loss. 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 Increased soft tissue component along the upper medial right mediastinum extending along the apex which may reflect further posttreatment changes, but the significant increase in the soft tissue component compared with 09/08/2015 raises the concern for recurrent disease with the soft tissue component measuring 6.1 x 7.7 x 3.5 cm. Trace right pleural effusion. No pneumothorax. Upper Abdomen: No acute abnormality. Musculoskeletal: No chest wall abnormality. No acute or significant osseous findings. Review of the MIP images confirms the above findings. IMPRESSION: 1. No evidence of pulmonary embolus. 2. 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 Increased soft tissue component along the upper medial right mediastinum extending along the apex which may reflect further posttreatment changes, but the significant increase in the soft tissue component compared with 09/08/2015 measuring 6.1 x 7.7 x 3.5 cm raises the concern for recurrent disease. If there is further clinical concern, recommend evaluation with a PET-CT. 3.  Aortic Atherosclerosis (ICD10-I70.0). Electronically Signed   By: Kathreen Devoid   On: 05/02/2020 19:43   DG Chest Port 1 View  Result Date: 05/02/2020 CLINICAL DATA:  Acute onset of chest pain that began this afternoon while working in his yard. Personal history of RIGHT UPPER LOBE lung cancer post radiation therapy in 2015. EXAM: PORTABLE CHEST 1 VIEW COMPARISON:  10/05/2015 and earlier, including CTA chest 09/08/2015. FINDINGS: Cardiac silhouette normal in size, unchanged. Thoracic aorta mildly atherosclerotic, unchanged. 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 Consolidation involving the peripheral RIGHT UPPER LOBE, new since the 2016 examination. LEFT lung remains clear.  IMPRESSION: 1. Acute pneumonia and/or atelectasis involving the peripheral RIGHT UPPER LOBE. Given the patient's personal history of RIGHT UPPER LOBE lung cancer, a central recurrence with obstruction is possible. CT of the chest with contrast is recommended in further evaluation. 2. 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 Electronically Signed  By: Evangeline Dakin M.D.   On: 05/02/2020 18:29     LOS: 1 day   Antonieta Pert, MD [facility]  05/03/2020, 10:29 AM

## 2020-05-03 NOTE — Progress Notes (Signed)
Brief oncology note:  Notified of request for consult on this patient.  He is currently followed by Dr. Alverda Skeans of medical oncology at [facility].  Reviewed chart and CT scan results with on-call MD.  Recommend for the hospitalist to treat his acute illness and then refer the patient back to his primary oncologist at [facility] for further evaluation of his abnormal CT scan results.  Please contact oncology if any urgent oncology issues that may arise during his hospitalization.  Mikey Bussing, DNP, AGPCNP-BC, AOCNP Mon/Tues/Thurs/Fri 7am-5pm; Off Wednesdays Cell: [phone]

## 2020-05-03 NOTE — Progress Notes (Signed)
Pt had 12 beats of VTACH on tele. Asymptomatic. Dr. Lupita Leash made aware.

## 2020-05-03 NOTE — Plan of Care (Signed)
  Problem: Education: Goal: Knowledge of General Education information will improve Description: Including pain rating scale, medication(s)/side effects and non-pharmacologic comfort measures Outcome: Completed/Met   Problem: Clinical Measurements: Goal: Ability to maintain clinical measurements within normal limits will improve Outcome: Progressing Goal: Will remain free from infection Outcome: Progressing Goal: Diagnostic test results will improve Outcome: Progressing Goal: Respiratory complications will improve Outcome: Progressing Goal: Cardiovascular complication will be avoided Outcome: Progressing   Problem: Activity: Goal: Risk for activity intolerance will decrease Outcome: Progressing   Problem: Nutrition: Goal: Adequate nutrition will be maintained Outcome: Progressing   Problem: Coping: Goal: Level of anxiety will decrease Outcome: Completed/Met   Problem: Elimination: Goal: Will not experience complications related to bowel motility Outcome: Completed/Met Goal: Will not experience complications related to urinary retention Outcome: Completed/Met   Problem: Pain Managment: Goal: General experience of comfort will improve Outcome: Progressing   Problem: Safety: Goal: Ability to remain free from injury will improve Outcome: Progressing   Problem: Skin Integrity: Goal: Risk for impaired skin integrity will decrease Outcome: Completed/Met   Problem: Activity: Goal: Ability to tolerate increased activity will improve Outcome: Progressing   Problem: Clinical Measurements: Goal: Ability to maintain a body temperature in the normal range will improve Outcome: Progressing   Problem: Respiratory: Goal: Ability to maintain adequate ventilation will improve Outcome: Progressing Goal: Ability to maintain a clear airway will improve Outcome: Progressing

## 2020-05-03 NOTE — Consult Note (Addendum)
Cardiology Consultation:   Patient ID: Daryl Vega MRN: [id number]; DOB: 10-29-1946  Admit date: 05/02/2020 Date of Consult: 05/03/2020  Primary Care Provider: Fransisca Connors, PA-C Primary Cardiologist: New Primary Electrophysiologist:  None    Patient Profile:   Daryl Vega is a 74 y.o. 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 Maren Beach.  History of Present Illness:   Daryl Vega has no pmh of MI, stent, heart failure, stroke. Family history positive for mother with CAD. He is a retired Administrator. Former smoker. No alcohol or drug use.  The patient presented to the ED 05/02/20 for chest pain. He was working around his house when started noticing a discomfort in his right upper chest. It gradually got worse until it was a 10/10 sharp pain. It went into his neck and upper back. Taking a deep breath in and coughing made the pain worse. Pain not worse on exertion. He reported sob for the last couple days. He doesn't feel he was more sob than normal. Says he felt diaphoretic and vomited hors later (once in the ED). EMS was called who noted he was tachycardiac and hypoxic to 89%. They administered aspirin which did not help the pain. Patient had been vaccinated for covid. He says he niece had pneumonia/infection last week. Also noted he had been working the day prior with his upper body and had some chest/neck MSK pain.   In the ED BP 129/83, pulse 106, 100 degrees F, RR19. Labs showed WBC 17.7, Hgb 13.3,  glucose 172, potassium 4.4, creatinine 1.10. normal LFTs. HS trop 5>5>5. CXR showed acute PNA in the RUL and possible obstructions. CTA chest showed no PE, R mediastinal fibrosis, possible recurrent disease, aortic atherosclerosis. PET-CT  recommended. COVID negative. LA normal. BNP 47. BC pending. The patient was started on abx and admitted. EKG today shows sinus with 1st degree AV block, Qtc 413ms, nonspecific St/T wave changes   Past Medical History:  Diagnosis Date  . Anemia   . C. difficile enteritis   . Cholecystoduodenal fistula   . COPD (chronic obstructive pulmonary disease) ([place])   . DM (diabetes mellitus) ([place])   . Duodenal ulcer   . Elevated LFTs   . FH: chemotherapy   . Gastroparesis 02/09/2015  . Hypercholesteremia   . Lung cancer ([place])   . Melanoma ([place])   . Pneumonia   . Protein calorie malnutrition ([place])   . Pulmonary embolus ([place])   . Radiation 08/03/14-08/23/14   35 gray to right chest    Past Surgical History:  Procedure Laterality Date  . CHOLECYSTECTOMY    . duodenostomy tube    . ESOPHAGOGASTRODUODENOSCOPY N/A 11/30/2014   Procedure: ESOPHAGOGASTRODUODENOSCOPY (EGD);  Surgeon: [name], MD;  Location: [facility];  Service: Endoscopy;  Laterality: N/A;  . JEJUNOSTOMY FEEDING TUBE    . LAPAROTOMY N/A 11/30/2014   Procedure: EXPLORATORY LAPAROTOMY, PYLOROPLASTY, OVERSEWING OF POSTERIOR DUODENUM, DUODENOSTOMY, CHOLECYSTECTOMY, JEJEUNOSTOMY;  Surgeon: Jackolyn Confer, MD;  Location: [facility];  Service: General;  Laterality: N/A;  . PYLOROPLASTY    . VIDEO BRONCHOSCOPY Bilateral 07/20/2014   Procedure: VIDEO BRONCHOSCOPY WITHOUT FLUORO;  Surgeon: Tanda Rockers, MD;  Location: [facility];  Service: Cardiopulmonary;  Laterality: Bilateral;     Home Medications:  Prior to  Admission medications   Medication Sig Start Date End Date Taking? Authorizing Provider  atorvastatin (LIPITOR) 80 MG tablet Take 80 mg by mouth daily. Reported on 06/02/2016   Yes [provider]  clotrimazole-betamethasone (LOTRISONE) cream Apply 1 application topically 2 (two) times daily as needed (dry skin).  12/27/19  Yes [provider]  gabapentin (NEURONTIN) 300 MG capsule Take 900 mg by mouth 3  (three) times daily.  03/30/20  Yes [provider]  glipiZIDE (GLUCOTROL XL) 10 MG 24 hr tablet Take 10 mg by mouth daily. 04/04/20  Yes [provider]  JANUVIA 25 MG tablet Take 25 mg by mouth daily. 03/26/20  Yes [provider]  metFORMIN (GLUCOPHAGE) 500 MG tablet Take 500 mg by mouth in the morning, at noon, and at bedtime.  01/09/20  Yes [provider]  rosuvastatin (CRESTOR) 20 MG tablet Take 20 mg by mouth at bedtime. 04/04/20  Yes [provider]  traMADol (ULTRAM) 50 MG tablet Take 50 mg by mouth every 6 (six) hours as needed for moderate pain or severe pain.  04/21/20  Yes [provider]  acetaminophen (TYLENOL) 325 MG tablet Take 2 tablets (650 mg total) by mouth every 6 (six) hours as needed for mild pain (or Fever >/= 101). Patient not taking: Reported on 06/02/2016 10/01/15   Robbie Lis, MD  apixaban (ELIQUIS) 5 MG TABS tablet Take 1 tablet (5 mg total) by mouth 2 (two) times daily. Patient not taking: Reported on 05/02/2020 09/18/15   Theodis Blaze, MD  bismuth subsalicylate (PEPTO BISMOL) 262 MG/15ML suspension Place 30 mLs into feeding tube every 4 (four) hours as needed for indigestion. Patient not taking: Reported on 05/02/2020 01/11/15   Rama, Venetia Maxon, MD  budesonide-formoterol [facility]) 160-4.5 MCG/ACT inhaler Take 2 puffs first thing in am and then another 2 puffs about 12 hours later. Patient not taking: Reported on 09/24/2015 01/11/15   Rama, Venetia Maxon, MD  DULoxetine (CYMBALTA) 20 MG capsule Take 1 capsule (20 mg total) by mouth daily. Patient not taking: Reported on 05/02/2020 01/11/15   Rama, Venetia Maxon, MD  feeding supplement, ENSURE ENLIVE, (ENSURE ENLIVE) LIQD Take 237 mLs by mouth 2 (two) times daily between meals. Patient not taking: Reported on 06/02/2016 10/01/15   Robbie Lis, MD  HYDROmorphone (DILAUDID) 4 MG tablet Take 1 tablet (4 mg total) by mouth every 8 (eight) hours as needed for severe  pain. Patient not taking: Reported on 06/02/2016 09/13/15   Theodis Blaze, MD  LORazepam (ATIVAN) 0.5 MG tablet Take 1 table by mouth every 12 hours as needed for anxiety Patient not taking: Reported on 06/02/2016 09/13/15   Theodis Blaze, MD  Multiple Vitamins-Iron (MULTIVITAMINS WITH IRON) TABS tablet Take 1 tablet by mouth daily after supper. Patient not taking: Reported on 05/02/2020 12/19/14   Dhungel, Flonnie Overman, MD  pantoprazole (PROTONIX) 40 MG tablet Take 1 tablet (40 mg total) by mouth 2 (two) times daily. Patient not taking: Reported on 05/02/2020 12/19/14   Dhungel, Flonnie Overman, MD  potassium chloride SA (K-DUR,KLOR-CON) 20 MEQ tablet Take 2 tablets (40 mEq total) by mouth daily. Patient not taking: Reported on 05/02/2020 12/19/14   Dhungel, Flonnie Overman, MD  prochlorperazine (COMPAZINE) 10 MG tablet Take 1 tablet (10 mg total) by mouth every 6 (six) hours as needed for nausea or vomiting. Patient not taking: Reported on 06/02/2016 02/09/15   Earnstine Regal, PA-C    Inpatient Medications: Scheduled Meds: . aspirin EC  81 mg Oral  Daily  . budesonide  0.5 mg Nebulization BID  . gabapentin  900 mg Oral TID  . heparin  5,000 Units Subcutaneous Q8H  . insulin aspart  0-15 Units Subcutaneous TID WC  . ipratropium-albuterol  3 mL Nebulization QID  . pantoprazole  40 mg Oral BID  . rosuvastatin  40 mg Oral QHS   Continuous Infusions: . ampicillin-sulbactam (UNASYN) IV 3 g (05/03/20 0407)  . azithromycin (ZITHROMAX) 500 MG IVPB (Vial-Mate [employer])     PRN Meds: acetaminophen **OR** acetaminophen, bisacodyl, ondansetron **OR** ondansetron (ZOFRAN) IV, polyethylene glycol, traMADol  Allergies:   No Known Allergies  Social History:   Social History   Socioeconomic History  . Marital status: Married    Spouse name: Not on file  . Number of children: Not on file  . Years of education: Not on file  . Highest education level: Not on file  Occupational History  . Occupation: Truck Geophysicist/field seismologist   Tobacco  Use  . Smoking status: Former Smoker    Packs/day: 2.00    Years: 50.00    Pack years: 100.00    Types: Cigarettes, Cigars    Quit date: 06/12/2014    Years since quitting: 5.8  . Smokeless tobacco: Former Systems developer    Types: [place] date: 09/07/2012  Substance and Sexual Activity  . Alcohol use: No  . Drug use: No  . Sexual activity: Never  Other Topics Concern  . Not on file  Social History Narrative  . Not on file   Social Determinants of Health   Financial Resource Strain:   . Difficulty of Paying Living Expenses:   Food Insecurity:   . Worried About Charity fundraiser in the Last Year:   . Arboriculturist in the Last Year:   Transportation Needs:   . Film/video editor (Medical):   [name] Lack of Transportation (Non-Medical):   Physical Activity:   . Days of Exercise per Week:   . Minutes of Exercise per Session:   Stress:   . Feeling of Stress :   Social Connections:   . Frequency of Communication with Friends and Family:   . Frequency of Social Gatherings with Friends and Family:   . Attends Religious Services:   . Active Member of Clubs or Organizations:   . Attends Archivist Meetings:   [name] Marital Status:   Intimate Partner Violence:   . Fear of Current or Ex-Partner:   . Emotionally Abused:   [name] Physically Abused:   . Sexually Abused:     Family History:   Family History  Problem Relation Age of Onset  . Heart disease Mother   . Cancer Mother        ? type     ROS:  Please see the history of present illness.  All other ROS reviewed and negative.     Physical Exam/Data:   Vitals:   05/02/20 2149 05/03/20 0224 05/03/20 0438 05/03/20 0734  BP: 123/64 109/76 115/68   Pulse: (!) 101 96 93 97  Resp: 20  (!) 22 20  Temp: 99.3 F (37.4 C) 98.6 F (37 C) 99.6 F (37.6 C)   TempSrc: Oral Oral Oral   SpO2: 95% 90% 96% 93%  Weight:      Height:        Intake/Output Summary (Last 24 hours) at 05/03/2020 0832 Last data filed at 05/03/2020  0437 Gross per 24 hour  Intake 1334.54 ml  Output 300  ml  Net 1034.54 ml   Last 3 Weights 05/02/2020 10/01/2015 09/30/2015  Weight (lbs) 182 lb 159 lb 3.2 oz 159 lb 8 oz  Weight (kg) 82.555 kg 72.213 kg 72.349 kg     Body mass index is 27.67 kg/m.  General:  Well nourished, well developed, in no acute distress HEENT: normal Lymph: no adenopathy Neck: no JVD Endocrine:  No thryomegaly Vascular: No carotid bruits; FA pulses 2+ bilaterally without bruits  Cardiac:  normal S1, S2; RRR; no murmur  Lungs: diminished on the left side Abd: soft, nontender, no hepatomegaly  Ext: no edema Musculoskeletal:  No deformities, BUE and BLE strength normal and equal Skin: warm and dry  Neuro:  CNs 2-12 intact, no focal abnormalities noted Psych:  Normal affect   EKG:  The EKG was personally reviewed and demonstrates:   sinus with 1st degree AV block, Qtc 475ms, nonspecific St/T wave changes, q waves inferior and anterior leads Telemetry:  Telemetry was personally reviewed and demonstrates:  NSR, HR 90s, PVCs NSVT 12 beat run and 7 beat run, transient first degree AV block  Relevant CV Studies:  Echo 09/09/2015 Study Conclusions   - Left ventricle: The cavity size was normal. Systolic function was  normal. The estimated ejection fraction was in the range of 50%  to 55%. Wall motion was normal; there were no regional wall  motion abnormalities.  - Ventricular septum: Septal motion showed paradox. These changes  are consistent with RV volume and pressure overload.  - Right ventricle: The cavity size was moderately dilated. Wall  thickness was normal.  - Right atrium: The atrium was mildly dilated.  - Pericardium, extracardiac: A small pericardial effusion was  identified circumferential to the heart.   Laboratory Data:  High Sensitivity Troponin:   Recent Labs  Lab 05/02/20 1715 05/02/20 1825 05/02/20 2217 05/03/20 0218  TROPONINIHS 5 5 5 6      Chemistry Recent Labs   Lab 05/02/20 1825 05/02/20 1857 05/03/20 0510  NA 141 139 137  K 4.4 4.5 3.8  CL 105 104 105  CO2 25  --  27  GLUCOSE 172* 168* 100*  BUN 18 21 14   CREATININE 1.10 1.10 0.94  CALCIUM 9.3  --  8.3*  GFRNONAA >60  --  >60  GFRAA >60  --  >60  ANIONGAP 11  --  5    Recent Labs  Lab 05/02/20 1825 05/03/20 0510  PROT 7.7 6.6  ALBUMIN 4.0 3.5  AST 23 19  ALT 25 20  ALKPHOS 60 55  BILITOT 0.9 1.1   Hematology Recent Labs  Lab 05/02/20 1715 05/02/20 1857 05/03/20 0510  WBC 17.7*  --  13.3*  RBC 4.36  --  4.05*  HGB 13.0 13.3 11.7*  HCT 38.9* 39.0 36.8*  MCV 89.2  --  90.9  MCH 29.8  --  28.9  MCHC 33.4  --  31.8  RDW 13.6  --  13.7  PLT 239  --  155   BNP Recent Labs  Lab 05/02/20 1715  BNP 47.3    DDimer No results for input(s): DDIMER in the last 168 hours.   Radiology/Studies:  CT Angio Chest PE W and/or Wo Contrast  Result Date: 05/02/2020 CLINICAL DATA:  Shortness of breath, chest pain EXAM: CT ANGIOGRAPHY CHEST WITH CONTRAST TECHNIQUE: Multidetector CT imaging of the chest was performed using the standard protocol during bolus administration of intravenous contrast. Multiplanar CT image reconstructions and MIPs were obtained to evaluate the vascular anatomy.  CONTRAST:  191mL OMNIPAQUE IOHEXOL 350 MG/ML SOLN COMPARISON:  09/08/2015 FINDINGS: Cardiovascular: Satisfactory opacification of the pulmonary arteries to the segmental level. No evidence of pulmonary embolism. Normal heart size. No pericardial effusion. Mediastinum/Nodes: Mediastinum is shifted towards the right. No lymphadenopathy. Trachea is normal. Thyroid gland is normal. Esophagus demonstrates no focal abnormality. Lungs/Pleura: Right lung volume loss. 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 Increased soft tissue component along the upper medial right mediastinum extending along the apex which may reflect further posttreatment changes, but the significant increase in  the soft tissue component compared with 09/08/2015 raises the concern for recurrent disease with the soft tissue component measuring 6.1 x 7.7 x 3.5 cm. Trace right pleural effusion. No pneumothorax. Upper Abdomen: No acute abnormality. Musculoskeletal: No chest wall abnormality. No acute or significant osseous findings. Review of the MIP images confirms the above findings. IMPRESSION: 1. No evidence of pulmonary embolus. 2. 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 Increased soft tissue component along the upper medial right mediastinum extending along the apex which may reflect further posttreatment changes, but the significant increase in the soft tissue component compared with 09/08/2015 measuring 6.1 x 7.7 x 3.5 cm raises the concern for recurrent disease. If there is further clinical concern, recommend evaluation with a PET-CT. 3.  Aortic Atherosclerosis (ICD10-I70.0). Electronically Signed   By: Kathreen Devoid   On: 05/02/2020 19:43   DG Chest Port 1 View  Result Date: 05/02/2020 CLINICAL DATA:  Acute onset of chest pain that began this afternoon while working in his yard. Personal history of RIGHT UPPER LOBE lung cancer post radiation therapy in 2015. EXAM: PORTABLE CHEST 1 VIEW COMPARISON:  10/05/2015 and earlier, including CTA chest 09/08/2015. FINDINGS: Cardiac silhouette normal in size, unchanged. Thoracic aorta mildly atherosclerotic, unchanged. 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 Consolidation involving the peripheral RIGHT UPPER LOBE, new since the 2016 examination. LEFT lung remains clear. IMPRESSION: 1. Acute pneumonia and/or atelectasis involving the peripheral RIGHT UPPER LOBE. Given the patient's personal history of RIGHT UPPER LOBE lung cancer, a central recurrence with obstruction is possible. CT of the chest with contrast is recommended in  further evaluation. 2. 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 Electronically Signed   By: Evangeline Dakin M.D.   On: 05/02/2020 18:29   { HEAR Score (for undifferentiated chest pain):  HEAR Score: 5    Assessment and Plan:   PNA/Possible sepsis - WBC elevated. Febrile on admission - LA normal and BC pending - started on abx per IM - Duonebs  H/o of NSCLC/Right mediastinal opacity concern for recurrent disease - recommend follow up PET scan - Plan to see oncologist  Chest pain - CP in the setting of possible pneumonia and CT chest changes. Pain sounds more pleuritic in nature. Says pain has improved to about 4/10  - HS troponin negative x3. EKG with first degree AV block and nonspecific changes - tele showed 12 beats NSVT - started on aspirin - No prior cardiac history. Does have RF for CAD including diabetes, former smoker, HLD and family history - Aortic atherosclerosis on CTA chest - check echo - check TSH and A1C - Given RF can consider stress test vs cardiac CT for ischemic eval. Will discuss with MD.  HLD - rosuvastatin 20 mg daily - check lipid panel  NSVT -  longest was 12 beat run overnight - start BB - continue tele  For questions or updates, please contact [facility] Please consult [email or web address] for contact info under   Signed, Cadence Ninfa Meeker, PA-C  05/03/2020 8:32 AM   The patient was seen, examined and discussed with Cadence Ninfa Meeker, PA-C   and I agree with the above.   74 y.o. 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 Maren Beach.  He has no prior cardiac history but family history positive for mother with CAD. He is a retired  Administrator. Former smoker.  The pain was exertional however sharp with radiation to his neck and back also with deep breathing and motion as well as coughing.  His troponin was negative x2, his labs show normal electrolytes kidney and liver function, hemoglobin 11.7, LDL of 24 triglycerides 70 and HDL 37.  EKG shows sinus rhythm with first-degree AV block and nonspecific ST-T wave abnormalities.  He underwent chest CTA that showed no pulmonary emboli however there is significant increase in soft tissue component of area where he was previously treated with radiation therapy measuring 6 x 7 x 3 cm concerning for recurrent lung cancer, PET scan was recommended.  On physical exam he is not in acute distress, he has no JVDs no carotid bruits S1-S2 regular with minimal 2 out of 6 systolic murmur no crackles at lung bases, weak peripheral pulses and no edema.  Patient's symptoms sound very typical however he has multiple risk factors and might possibly require recurrent surgery or chemo for possible recurrent lung cancer, we will proceed with stress test Lexiscan nuclear test tomorrow.  Continue baby aspirin, rosuvastatin 40 mg daily, could consider low-dose beta-blocker however he has history of COPD and is on inhalers, I would hold off for now if needed I would start low dose Bystolic.  Ena Dawley, MD 05/03/2020

## 2020-05-04 ENCOUNTER — Ambulatory Visit (HOSPITAL_COMMUNITY)
Admission: RE | Admit: 2020-05-04 | Discharge: 2020-05-04 | Disposition: A | Discharge status: Home / Self Care | Payer: Medicare Other | Source: Ambulatory Visit | Attending: Medical | Admitting: Medical

## 2020-05-04 DIAGNOSIS — R079 Chest pain, unspecified: Secondary | ICD-10-CM | POA: Diagnosis not present

## 2020-05-04 DIAGNOSIS — G249 Dystonia, unspecified: Secondary | ICD-10-CM | POA: Insufficient documentation

## 2020-05-04 LAB — CBC WITH DIFFERENTIAL/PLATELET
Abs Immature Granulocytes: 0.05 10*3/uL (ref 0.00–0.07)
Basophils Absolute: 0 10*3/uL (ref 0.0–0.1)
Basophils Relative: 0 %
Eosinophils Absolute: 0.1 10*3/uL (ref 0.0–0.5)
Eosinophils Relative: 1 %
HCT: 36.6 % — ABNORMAL LOW (ref 39.0–52.0)
Hemoglobin: 11.6 g/dL — ABNORMAL LOW (ref 13.0–17.0)
Immature Granulocytes: 1 %
Lymphocytes Relative: 22 %
Lymphs Abs: 2 10*3/uL (ref 0.7–4.0)
MCH: 28.5 pg (ref 26.0–34.0)
MCHC: 31.7 g/dL (ref 30.0–36.0)
MCV: 89.9 fL (ref 80.0–100.0)
Monocytes Absolute: 1 10*3/uL (ref 0.1–1.0)
Monocytes Relative: 11 %
Neutro Abs: 5.9 10*3/uL (ref 1.7–7.7)
Neutrophils Relative %: 65 %
Platelets: 149 10*3/uL — ABNORMAL LOW (ref 150–400)
RBC: 4.07 MIL/uL — ABNORMAL LOW (ref 4.22–5.81)
RDW: 13.8 % (ref 11.5–15.5)
WBC: 9.1 10*3/uL (ref 4.0–10.5)
nRBC: 0 % (ref 0.0–0.2)

## 2020-05-04 LAB — COMPREHENSIVE METABOLIC PANEL
ALT: 19 U/L (ref 0–44)
AST: 17 U/L (ref 15–41)
Albumin: 3.4 g/dL — ABNORMAL LOW (ref 3.5–5.0)
Alkaline Phosphatase: 67 U/L (ref 38–126)
Anion gap: 6 (ref 5–15)
BUN: 16 mg/dL (ref 8–23)
CO2: 23 mmol/L (ref 22–32)
Calcium: 8.3 mg/dL — ABNORMAL LOW (ref 8.9–10.3)
Chloride: 106 mmol/L (ref 98–111)
Creatinine, Ser: 1.04 mg/dL (ref 0.61–1.24)
GFR calc Af Amer: >60 mL/min (ref >60)
GFR calc non Af Amer: >60 mL/min (ref >60)
Glucose, Bld: 194 mg/dL — ABNORMAL HIGH (ref 70–99)
Potassium: 3.7 mmol/L (ref 3.5–5.1)
Sodium: 135 mmol/L (ref 135–145)
Total Bilirubin: 0.7 mg/dL (ref 0.3–1.2)
Total Protein: 6.9 g/dL (ref 6.5–8.1)

## 2020-05-04 LAB — NM MYOCAR MULTI W/SPECT W/WALL MOTION / EF
Estimated workload: 1 METS
Exercise duration (min): 0 min
Exercise duration (sec): 0 s
MPHR: 148 {beats}/min
Peak HR: 99 {beats}/min
Percent HR: 66 %
RPE: 0
Rest HR: 95 {beats}/min

## 2020-05-04 LAB — HEMOGLOBIN A1C
Hgb A1c MFr Bld: 8.4 % — ABNORMAL HIGH (ref 4.8–5.6)
Mean Plasma Glucose: 194 mg/dL

## 2020-05-04 LAB — GLUCOSE, CAPILLARY
Glucose-Capillary: 151 mg/dL — ABNORMAL HIGH (ref 70–99)
Glucose-Capillary: 256 mg/dL — ABNORMAL HIGH (ref 70–99)

## 2020-05-04 MED ORDER — AMOXICILLIN-POT CLAVULANATE 875-125 MG PO TABS: 1.0000 | ORAL_TABLET | Freq: Two times a day (BID) | ORAL | 0 refills | Status: AC

## 2020-05-04 MED ORDER — TECHNETIUM TC 99M TETROFOSMIN IV KIT
32.9000 | PACK | Freq: Once | INTRAVENOUS | Status: AC | PRN
  Administered 2020-05-04: 32.9 via INTRAVENOUS

## 2020-05-04 MED ORDER — REGADENOSON 0.4 MG/5ML IV SOLN
0.4000 mg | Freq: Once | INTRAVENOUS | Status: AC
  Administered 2020-05-04: 0.4 mg via INTRAVENOUS

## 2020-05-04 MED ORDER — TECHNETIUM TC 99M TETROFOSMIN IV KIT
10.6900 | PACK | Freq: Once | INTRAVENOUS | Status: AC | PRN
  Administered 2020-05-04: 10.69 via INTRAVENOUS

## 2020-05-04 MED ORDER — REGADENOSON 0.4 MG/5ML IV SOLN
INTRAVENOUS | Status: AC
  Filled 2020-05-04: qty 5

## 2020-05-04 NOTE — Progress Notes (Signed)
Patient and his wife given discharge, follow up, and medication instructions including adverse effects on antibiotics, IV and telemetry monitor removed, personal belongings with patient, family to transport home

## 2020-05-04 NOTE — Plan of Care (Signed)
  Problem: Health Behavior/Discharge Planning: Goal: Ability to manage health-related needs will improve Outcome: Adequate for Discharge   Problem: Clinical Measurements: Goal: Ability to maintain clinical measurements within normal limits will improve Outcome: Adequate for Discharge Goal: Will remain free from infection Outcome: Adequate for Discharge Goal: Diagnostic test results will improve Outcome: Adequate for Discharge Goal: Respiratory complications will improve Outcome: Adequate for Discharge Goal: Cardiovascular complication will be avoided Outcome: Adequate for Discharge   Problem: Activity: Goal: Risk for activity intolerance will decrease Outcome: Adequate for Discharge   Problem: Nutrition: Goal: Adequate nutrition will be maintained Outcome: Adequate for Discharge   Problem: Pain Managment: Goal: General experience of comfort will improve Outcome: Adequate for Discharge   Problem: Safety: Goal: Ability to remain free from injury will improve Outcome: Adequate for Discharge   Problem: Activity: Goal: Ability to tolerate increased activity will improve Outcome: Adequate for Discharge   Problem: Clinical Measurements: Goal: Ability to maintain a body temperature in the normal range will improve Outcome: Adequate for Discharge   Problem: Respiratory: Goal: Ability to maintain adequate ventilation will improve Outcome: Adequate for Discharge Goal: Ability to maintain a clear airway will improve Outcome: Adequate for Discharge

## 2020-05-04 NOTE — Progress Notes (Signed)
Pt off unit, transported to [facility] for stress test via [facility]

## 2020-05-04 NOTE — Progress Notes (Signed)
nuc study was read, EF read as decreased but with echo it was normal.  Discussed with Dr. Marlou Porch and we believe low risk with atypical chest pain.

## 2020-05-04 NOTE — Progress Notes (Signed)
Stress portion of nuc study completed without complications.  His hand and shoulder pain were increased after radiology portion done.  Results to follow this afternoon

## 2020-05-04 NOTE — Discharge Summary (Addendum)
Physician Discharge Summary  Daryl Vega IRJ:[id number] DOB: Jan 20, 1946 DOA: 05/02/2020  PCP: Fransisca Connors, PA-C  Admit date: 05/02/2020 Discharge date: 05/04/2020  Admitted From: home Disposition:  home  Recommendations for Outpatient Follow-up:  1. Follow up with PCP in 1-2 weeks 2. Please follow-up with Dr. Alverda Skeans from oncology regarding the abnormal CT scan finding 3. Please follow up on the following pending results:  Home Health:no  Equipment/Devices: none  Discharge Condition: Stable Code Status: full Diet recommendation:  Diet Order            Diet Heart Room service appropriate? 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 In ED tachycardic 106-119, respiration 19-22 SPO2 95% on 2 L, T-max 100.  Blood work showed leukocytosis 17.7, lactic acid 1.8 blood culture sent, Covid PCR negative, chest x-ray pneumonia/atelectasis involving right peripheral right upper lobe, CT A chest negative for PE but postradiation changes and increased soft tissue component along the upper right mediastinum concern for recurrence.  Patient was given ceftriaxone, azithromycin in ED, was subsequently admitted on Unasyn and azithromycin Patient was admitted today with IV antibiotics.  His symptoms has improved.  No more leukocytosis.  This time he is stable for discharge on oral antibiotics.  He was seen by cardiology underwent nuclear stress test intermediate risk since EF is normal  and he felt it safe to be discharged and no further work-up needed. Of note he does have concern for recurrent lung cancer of which he is aware and is going to follow-up with his oncologist at [facility].  Discharge Diagnoses:  Sepsis/possible post obstructive pneumonia: POA.  Overall improved.  Doing well on room air.  Leukocytosis resolved.  We will discharge him on Augmentin to complete the course and he will follow up with his oncologist.  Chest pain ACS rule out, underwent nuclear stress test reviewed by cardiology EF is read as decreased but with echo it was normal, and per cardiology low risk with atypical chest pain and advised okay to discharge home.  Hx of NSCLC Dx in 2015 s/p chemo/radiation currently in remission: CTA shows increasing soft tissue component along the upper medial right mediastinum extending along the apex which may reflect further posttreatment changes but the significant increase in the soft tissue component compared with September 2016 measuring 6.1 X7.7X 3.7 raises  concern for recurrent disease and oncology has been consulted.  Continue IV antibiotics bronchodilators treatment.  COPD mixed type: Continue on current bronchodilators nebs antibiotics as above.  Type 2 diabetes mellitus, stable with polyneuropathy, A1c 5.7.  Continue home regimen.  GERD/history of perforating PUD, continue PPI  Chronic pain/neuropathy followed up at pain clinic continue tramadol as needed and Neurontin  NM stress test: Perfusion along the apical inferior wall and inferolateral wall is equivocal. Difficult to exclude reversibility in these areas because there is some gut contamination. No other areas are concerning for reversibility. Concern for infarct  involving the mid and basal segments of the inferior wall.  Mild hypokinesia with some dyskinesia involving the inferior wall. Left ventricular ejection fraction 43% Non invasive risk stratification*:  Intermediate  Consults:  cardio  Subjective: Complains of some pain on the arm due to stress test.  No nausea vomiting shortness of breath fever or chills.  Discharge Exam: Vitals:   05/04/20 1144 05/04/20 1337  BP: 134/65 125/76  Pulse: 94 92  Resp: 18 (!) 24  Temp: 98.4 F (36.9 C) 98.3 F (36.8 C)  SpO2: 98% 91%   General: Pt is alert, awake, not in acute distress Cardiovascular: RRR, S1/S2 +, no rubs, no gallops Respiratory: CTA bilaterally, no wheezing, no rhonchi Abdominal: Soft, NT, ND, bowel sounds + Extremities: no edema, no cyanosis  Discharge Instructions  Discharge Instructions    Diet - low sodium heart healthy   Complete by: As directed    Discharge instructions   Complete by: As directed    Please follow-up with your oncologist as soon as possible in a week or so given abnormal finding densities can that is worrisome for possible recurrence of your lung cancer so will need further work-up/PET scan.  Please call call MD or return to ER for similar or worsening recurring problem that brought you to hospital or if any fever,nausea/vomiting,abdominal pain, uncontrolled pain, chest pain,  shortness of breath or any other alarming symptoms.  Please follow-up your doctor as instructed in a week time and call the office for appointment.  Please avoid alcohol, smoking, or any other illicit substance and maintain healthy habits including taking your regular medications as prescribed.  You were cared for by a hospitalist during your hospital stay. If you have any questions about your discharge medications or the care you received while you were in the hospital after you are discharged, you can call the unit and ask to speak with the hospitalist on call if the hospitalist that took care of you is not available.  Once you are discharged, your primary care physician will handle any further medical issues. Please note that NO REFILLS for any discharge medications will be  authorized once you are discharged, as it is imperative that you return to your primary care physician (or establish a relationship with a primary care physician if you do not have one) for your aftercare needs so that they can reassess your need for medications and monitor your lab values   Increase activity slowly   Complete by: As directed      Allergies as of 05/04/2020   No Known Allergies     Medication List    STOP taking these medications   DULoxetine 20 MG capsule Commonly known as: CYMBALTA   feeding supplement (ENSURE ENLIVE) Liqd     TAKE these medications   acetaminophen 325 MG tablet Commonly known as: TYLENOL Take 2 tablets (650 mg total) by mouth every 6 (six) hours as needed for mild pain (or Fever >/= 101).   amoxicillin-clavulanate 875-125 MG tablet Commonly known as: Augmentin Take 1 tablet by mouth 2 (two) times daily for 5 days.   apixaban 5 MG Tabs tablet Commonly known as: ELIQUIS Take 1 tablet (5 mg total) by mouth 2 (two) times daily.   atorvastatin 80 MG tablet Commonly known as: LIPITOR Take 80 mg by mouth daily. Reported on 0/35/0093   bismuth subsalicylate 818 EX/93ZJ suspension Commonly known as: PEPTO BISMOL Place 30 mLs into feeding tube every 4 (four) hours as needed for indigestion.  budesonide-formoterol 160-4.5 MCG/ACT inhaler Commonly known as: Symbicort Take 2 puffs first thing in am and then another 2 puffs about 12 hours later.   clotrimazole-betamethasone cream Commonly known as: LOTRISONE Apply 1 application topically 2 (two) times daily as needed (dry skin).   gabapentin 300 MG capsule Commonly known as: NEURONTIN Take 900 mg by mouth 3 (three) times daily.   glipiZIDE 10 MG 24 hr tablet Commonly known as: GLUCOTROL XL Take 10 mg by mouth daily.   HYDROmorphone 4 MG tablet Commonly known as: DILAUDID Take 1 tablet (4 mg total) by mouth every 8 (eight) hours as needed for severe pain.   Januvia 25 MG tablet Generic  drug: sitaGLIPtin Take 25 mg by mouth daily.   LORazepam 0.5 MG tablet Commonly known as: ATIVAN Take 1 table by mouth every 12 hours as needed for anxiety   metFORMIN 500 MG tablet Commonly known as: GLUCOPHAGE Take 500 mg by mouth in the morning, at noon, and at bedtime.   multivitamins with iron Tabs tablet Take 1 tablet by mouth daily after supper.   pantoprazole 40 MG tablet Commonly known as: PROTONIX Take 1 tablet (40 mg total) by mouth 2 (two) times daily.   potassium chloride SA 20 MEQ tablet Commonly known as: KLOR-CON Take 2 tablets (40 mEq total) by mouth daily.   prochlorperazine 10 MG tablet Commonly known as: COMPAZINE Take 1 tablet (10 mg total) by mouth every 6 (six) hours as needed for nausea or vomiting.   rosuvastatin 20 MG tablet Commonly known as: CRESTOR Take 20 mg by mouth at bedtime.   traMADol 50 MG tablet Commonly known as: ULTRAM Take 50 mg by mouth every 6 (six) hours as needed for moderate pain or severe pain.      Follow-up Information    Fransisca Connors, PA-C Follow up in 1 week(s).   Specialty: Family Medicine Contact information: [place] [address] [phone]        Tod Persia, MD Follow up in 1 week(s).   Specialty: Hematology Why: regarding your lung findings in ct chest Contact information: [place] [address] [phone]          No Known Allergies  The results of significant diagnostics from this hospitalization (including imaging, microbiology, ancillary and laboratory) are listed below for reference.    Microbiology: Recent Results (from the past 240 hour(s))  Blood culture (routine x 2)     Status: None (Preliminary result)   Collection Time: 05/02/20  5:30 PM   Specimen: BLOOD LEFT FOREARM  Result Value Ref Range Status   Specimen Description   Final    BLOOD LEFT FOREARM Performed at [facility]  [address], [place], [place] [address]    Special Requests   Final    BOTTLES DRAWN AEROBIC AND ANAEROBIC Blood Culture adequate volume Performed at [facility] [address], [place], [place] [address]    Culture   Final    NO GROWTH 2 DAYS Performed at [facility] [address], [place], [place] [address]    Report Status PENDING  Incomplete  Blood culture (routine x 2)     Status: None (Preliminary result)   Collection Time: 05/02/20  5:35 PM   Specimen: BLOOD RIGHT FOREARM  Result Value Ref Range Status   Specimen Description   Final    BLOOD RIGHT FOREARM Performed at [facility],  [place] [address], [place], [place] [address]    Special Requests   Final    BOTTLES DRAWN AEROBIC AND ANAEROBIC Blood Culture adequate volume Performed at [facility] [address], [place], [place] [address]    Culture   Final    NO GROWTH 2 DAYS Performed at [facility] [address] [place], [place] [address]    Report Status PENDING  Incomplete  SARS Coronavirus 2 by RT PCR (hospital order, performed in [facility] hospital lab) Nasopharyngeal Nasopharyngeal Swab     Status: None   Collection Time: 05/02/20  5:37 PM   Specimen: Nasopharyngeal Swab  Result Value Ref Range Status   SARS Coronavirus 2 NEGATIVE NEGATIVE Final    Comment: (NOTE) SARS-CoV-2 target nucleic acids are NOT DETECTED. The SARS-CoV-2 RNA is generally detectable in upper and lower respiratory specimens during the acute phase of infection. The lowest concentration of SARS-CoV-2 viral copies this assay can detect is 250 copies / mL. A negative result does not preclude SARS-CoV-2 infection and should not be used as the sole basis for treatment or other patient management decisions.  A negative result may occur with improper specimen collection / handling, submission of specimen other than nasopharyngeal swab, presence of viral mutation(s) within  the areas targeted by this assay, and inadequate number of viral copies (<250 copies / mL). A negative result must be combined with clinical observations, patient history, and epidemiological information. Fact Sheet for Patients:   StrictlyIdeas.no Fact Sheet for Healthcare Providers: BankingDealers.co.za This test is not yet approved or cleared  by the [place] FDA and has been authorized for detection and/or diagnosis of SARS-CoV-2 by FDA under an Emergency Use Authorization (EUA).  This EUA will remain in effect (meaning this test can be used) for the duration of the COVID-19 declaration under Section 564(b)(1) of the Act, 21 U.S.C. section 360bbb-3(b)(1), unless the authorization is terminated or revoked sooner. Performed at [facility], [place] [address], [place], [place] [address]     Procedures/Studies: CT Angio Chest PE W and/or Wo Contrast  Result Date: 05/02/2020 CLINICAL DATA:  Shortness of breath, chest pain EXAM: CT ANGIOGRAPHY CHEST WITH CONTRAST TECHNIQUE: Multidetector CT imaging of the chest was performed using the standard protocol during bolus administration of intravenous contrast. Multiplanar CT image reconstructions and MIPs were obtained to evaluate the vascular anatomy. CONTRAST:  140mL OMNIPAQUE IOHEXOL 350 MG/ML SOLN COMPARISON:  09/08/2015 FINDINGS: Cardiovascular: Satisfactory opacification of the pulmonary arteries to the segmental level. No evidence of pulmonary embolism. Normal heart size. No pericardial effusion. Mediastinum/Nodes: Mediastinum is shifted towards the right. No lymphadenopathy. Trachea is normal. Thyroid gland is normal. Esophagus demonstrates no focal abnormality. Lungs/Pleura: Right lung volume loss. 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 Increased soft tissue component along the upper medial right mediastinum extending along the apex which may  reflect further posttreatment changes, but the significant increase in the soft tissue component compared with 09/08/2015 raises the concern for recurrent disease with the soft tissue component measuring 6.1 x 7.7 x 3.5 cm. Trace right pleural effusion. No pneumothorax. Upper Abdomen: No acute abnormality. Musculoskeletal: No chest wall abnormality. No acute or significant osseous findings. Review of the MIP images confirms the above findings. IMPRESSION: 1. No evidence of pulmonary embolus. 2. 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 Increased soft tissue component along the upper medial right mediastinum extending along the apex which may reflect further  posttreatment changes, but the significant increase in the soft tissue component compared with 09/08/2015 measuring 6.1 x 7.7 x 3.5 cm raises the concern for recurrent disease. If there is further clinical concern, recommend evaluation with a PET-CT. 3.  Aortic Atherosclerosis (ICD10-I70.0). Electronically Signed   By: Kathreen Devoid   On: 05/02/2020 19:43   NM Myocar Multi W/Spect W/Wall Motion / EF  Result Date: 05/04/2020 CLINICAL DATA:  74 year old with chest pain, unspecified. EXAM: MYOCARDIAL IMAGING WITH SPECT (REST AND PHARMACOLOGIC-STRESS) GATED LEFT VENTRICULAR WALL MOTION STUDY LEFT VENTRICULAR EJECTION FRACTION TECHNIQUE: Standard myocardial SPECT imaging was performed after resting intravenous injection of 10.69 mCi Tc-51m tetrofosmin. Subsequently, intravenous infusion of Lexiscan was performed under the supervision of the Cardiology staff. At peak effect of the drug, 32.9 mCi Tc-9m tetrofosmin was injected intravenously and standard myocardial SPECT imaging was performed. Quantitative gated imaging was also performed to evaluate left ventricular wall motion, and estimate left ventricular ejection fraction. COMPARISON:  None. FINDINGS: Perfusion: Fixed defect involving the mid and basal  segments of the inferior wall is suggestive for an infarct. Difficult to exclude reversibility along the apical inferior segment and inferolateral wall because there is some gut contamination in these areas on the rest imaging. No other areas are concerning for reversibility. Wall Motion: Mild hypokinesia throughout the left ventricle. Evidence for some dyskinesia involving the inferior wall. Left Ventricular Ejection Fraction: 43 % End diastolic volume 983 ml End systolic volume 81 ml IMPRESSION: 1. Perfusion along the apical inferior wall and inferolateral wall is equivocal. Difficult to exclude reversibility in these areas because there is some gut contamination. No other areas are concerning for reversibility. Concern for infarct involving the mid and basal segments of the inferior wall. 2. Mild hypokinesia with some dyskinesia involving the inferior wall. 3. Left ventricular ejection fraction 43% 4. Non invasive risk stratification*: Intermediate *2012 Appropriate Use Criteria for Coronary Revascularization Focused Update: J Am Coll Cardiol. 3825;05(3):976-734. http://content.airportbarriers.com.aspx?articleid=[email or web address] Electronically Signed   By: Markus Daft M.D.   On: 05/04/2020 13:10   DG Chest Port 1 View  Result Date: 05/02/2020 CLINICAL DATA:  Acute onset of chest pain that began this afternoon while working in his yard. Personal history of RIGHT UPPER LOBE lung cancer post radiation therapy in 2015. EXAM: PORTABLE CHEST 1 VIEW COMPARISON:  10/05/2015 and earlier, including CTA chest 09/08/2015. FINDINGS: Cardiac silhouette normal in size, unchanged. Thoracic aorta mildly atherosclerotic, unchanged. 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 Consolidation involving the peripheral RIGHT UPPER LOBE, new since the 2016 examination. LEFT lung remains clear. IMPRESSION: 1. Acute pneumonia and/or atelectasis involving the  peripheral RIGHT UPPER LOBE. Given the patient's personal history of RIGHT UPPER LOBE lung cancer, a central recurrence with obstruction is possible. CT of the chest with contrast is recommended in further evaluation. 2. 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 Electronically Signed   By: Evangeline Dakin M.D.   On: 05/02/2020 18:29   ECHOCARDIOGRAM COMPLETE  Result Date: 05/03/2020    ECHOCARDIOGRAM REPORT   Patient Name:   Daryl TRUDELL Date of Exam: 05/03/2020 Medical Rec #:  [id number]       Height:       68.0 in Accession #:    [id number]      Weight:       182.0 lb Date of Birth:  [birth year]       BSA:  1.963 m Patient Age:    17 years        BP:           115/68 mmHg Patient Gender: M               HR:           103 bpm. Exam Location:  Inpatient Procedure: 2D Echo Indications:    786.50 chest pain  History:        Patient has prior history of Echocardiogram examinations, most                 recent 09/09/2015. COPD; Risk Factors:Diabetes, Dyslipidemia and                 Former Smoker. Lung cancer. pulmonary embolus.  Sonographer:    Jannett Celestine RDCS (AE) Referring Phys: [id number] CADENCE H FURTH  Sonographer Comments: Technically difficult study due to poor echo windows, no parasternal window and suboptimal apical window. IMPRESSIONS  1. Diagnostic assessment limited due to suboptimal acoustic windows for imaging.  2. Left ventricular ejection fraction, by estimation, is 55 to 60%. The left ventricle has normal function. Left ventricular endocardial border not optimally defined to evaluate regional wall motion. The left ventricular internal cavity size was not well visualized. Left ventricular diastolic parameters are indeterminate.  3. Right ventricular systolic function was not well visualized. The right ventricular size is not well visualized.  4. Trivial pericardial effusion. The pericardial effusion is  surrounding the apex.  5. The mitral valve was not well visualized. No evidence of mitral valve regurgitation.  6. The aortic valve was not well visualized. Aortic valve regurgitation is not visualized.  7. The inferior vena cava is dilated in size with <50% respiratory variability, suggesting right atrial pressure of 15 mmHg. FINDINGS  Left Ventricle: Left ventricular ejection fraction, by estimation, is 55 to 60%. The left ventricle has normal function. Left ventricular endocardial border not optimally defined to evaluate regional wall motion. Definity contrast agent was given IV to delineate the left ventricular endocardial borders. The left ventricular internal cavity size was not well visualized. Left ventricular diastolic parameters are indeterminate. Right Ventricle: The right ventricular size is not well visualized. Right vetricular wall thickness was not assessed. Right ventricular systolic function was not well visualized. Left Atrium: Left atrial size was not well visualized. Right Atrium: Right atrial size was not well visualized. Pericardium: Trivial pericardial effusion. Trivial pericardial effusion is present. The pericardial effusion is surrounding the apex. Mitral Valve: The mitral valve was not well visualized. No evidence of mitral valve regurgitation. Tricuspid Valve: The tricuspid valve is not well visualized. Aortic Valve: The aortic valve was not well visualized. Aortic valve regurgitation is not visualized. Pulmonic Valve: The pulmonic valve was not assessed. Aorta: The aortic root was not well visualized. Venous: The inferior vena cava is dilated in size with less than 50% respiratory variability, suggesting right atrial pressure of 15 mmHg. IAS/Shunts: The interatrial septum was not well visualized.  MITRAL VALVE MV Area (PHT): 5.54 cm MV Decel Time: 137 msec MV E velocity: 103.00 cm/s Cherlynn Kaiser MD Electronically signed by Cherlynn Kaiser MD Signature Date/Time: 05/03/2020/10:17:35 PM     Final     Labs: BNP (last 3 results) Recent Labs    05/02/20 1715  BNP 05.3   Basic Metabolic Panel: Recent Labs  Lab 05/02/20 1825 05/02/20 1857 05/03/20 0510 05/04/20 0449  NA 141 139 137 135  K 4.4 4.5 3.8 3.7  CL  105 104 105 106  CO2 25  --  27 23  GLUCOSE 172* 168* 100* 194*  BUN 18 21 14 16   CREATININE 1.10 1.10 0.94 1.04  CALCIUM 9.3  --  8.3* 8.3*   Liver Function Tests: Recent Labs  Lab 05/02/20 1825 05/03/20 0510 05/04/20 0449  AST 23 19 17   ALT 25 20 19   ALKPHOS 60 55 67  BILITOT 0.9 1.1 0.7  PROT 7.7 6.6 6.9  ALBUMIN 4.0 3.5 3.4*   No results for input(s): LIPASE, AMYLASE in the last 168 hours. No results for input(s): AMMONIA in the last 168 hours. CBC: Recent Labs  Lab 05/02/20 1715 05/02/20 1857 05/03/20 0510 05/04/20 0449  WBC 17.7*  --  13.3* 9.1  NEUTROABS 14.9*  --  9.8* 5.9  HGB 13.0 13.3 11.7* 11.6*  HCT 38.9* 39.0 36.8* 36.6*  MCV 89.2  --  90.9 89.9  PLT 239  --  155 149*   Cardiac Enzymes: No results for input(s): CKTOTAL, CKMB, CKMBINDEX, TROPONINI in the last 168 hours. BNP: Invalid input(s): POCBNP CBG: Recent Labs  Lab 05/03/20 1149 05/03/20 1630 05/03/20 2107 05/04/20 0728 05/04/20 1139  GLUCAP 124* 245* 222* 151* 256*   D-Dimer No results for input(s): DDIMER in the last 72 hours. Hgb A1c Recent Labs    05/02/20 2217 05/03/20 0912  HGBA1C 8.4* 8.3*   Lipid Profile Recent Labs    05/03/20 0510  CHOL 75  HDL 37*  LDLCALC 24  TRIG 70  CHOLHDL 2.0   Thyroid function studies Recent Labs    05/03/20 0912  TSH 0.868   Anemia work up No results for input(s): VITAMINB12, FOLATE, FERRITIN, TIBC, IRON, RETICCTPCT in the last 72 hours. Urinalysis    Component Value Date/Time   COLORURINE YELLOW 09/24/2015 1907   APPEARANCEUR CLOUDY (A) 09/24/2015 1907   LABSPEC 1.025 09/24/2015 1907   PHURINE 6.0 09/24/2015 1907   GLUCOSEU NEGATIVE 09/24/2015 [place] NEGATIVE 09/24/2015 [place] (A) 09/24/2015 1907   KETONESUR NEGATIVE 09/24/2015 1907   PROTEINUR NEGATIVE 09/24/2015 1907   UROBILINOGEN 1.0 09/24/2015 1907   NITRITE NEGATIVE 09/24/2015 1907   LEUKOCYTESUR TRACE (A) 09/24/2015 1907   Sepsis Labs Invalid input(s): PROCALCITONIN,  WBC,  LACTICIDVEN Microbiology Recent Results (from the past 240 hour(s))  Blood culture (routine x 2)     Status: None (Preliminary result)   Collection Time: 05/02/20  5:30 PM   Specimen: BLOOD LEFT FOREARM  Result Value Ref Range Status   Specimen Description   Final    BLOOD LEFT FOREARM Performed at [facility], [place] [address], [place], [place] [address]    Special Requests   Final    BOTTLES DRAWN AEROBIC AND ANAEROBIC Blood Culture adequate volume Performed at [facility] [address] [place], [place] [address]    Culture   Final    NO GROWTH 2 DAYS Performed at [facility] [address], [place], [place] [address]    Report Status PENDING  Incomplete  Blood culture (routine x 2)     Status: None (Preliminary result)   Collection Time: 05/02/20  5:35 PM   Specimen: BLOOD RIGHT FOREARM  Result Value Ref Range Status   Specimen Description   Final    BLOOD RIGHT FOREARM Performed at [facility] [address], [place], [place] [address]    Special Requests   Final    BOTTLES DRAWN AEROBIC AND  ANAEROBIC Blood Culture adequate volume Performed at [facility] [address] [place], [place] [address]    Culture   Final    NO GROWTH 2 DAYS Performed at [facility] [address], [place], [place] [address]    Report Status PENDING  Incomplete  SARS Coronavirus 2 by RT PCR (hospital order, performed in [facility] hospital lab) Nasopharyngeal Nasopharyngeal Swab     Status: None   Collection Time: 05/02/20  5:37 PM   Specimen: Nasopharyngeal Swab  Result Value Ref Range Status   SARS Coronavirus 2  NEGATIVE NEGATIVE Final    Comment: (NOTE) SARS-CoV-2 target nucleic acids are NOT DETECTED. The SARS-CoV-2 RNA is generally detectable in upper and lower respiratory specimens during the acute phase of infection. The lowest concentration of SARS-CoV-2 viral copies this assay can detect is 250 copies / mL. A negative result does not preclude SARS-CoV-2 infection and should not be used as the sole basis for treatment or other patient management decisions.  A negative result may occur with improper specimen collection / handling, submission of specimen other than nasopharyngeal swab, presence of viral mutation(s) within the areas targeted by this assay, and inadequate number of viral copies (<250 copies / mL). A negative result must be combined with clinical observations, patient history, and epidemiological information. Fact Sheet for Patients:   StrictlyIdeas.no Fact Sheet for Healthcare Providers: BankingDealers.co.za This test is not yet approved or cleared  by the [place] FDA and has been authorized for detection and/or diagnosis of SARS-CoV-2 by FDA under an Emergency Use Authorization (EUA).  This EUA will remain in effect (meaning this test can be used) for the duration of the COVID-19 declaration under Section 564(b)(1) of the Act, 21 U.S.C. section 360bbb-3(b)(1), unless the authorization is terminated or revoked sooner. Performed at [facility], [place] [address] [place], [place] [address]      Time coordinating discharge: 25  minutes  SIGNED: Antonieta Pert, MD  [facility] 05/04/2020, 3:15 PM  If 7PM-7AM, please contact night-coverage [email or web address]

## 2020-05-07 LAB — CULTURE, BLOOD (ROUTINE X 2)
Culture: NO GROWTH
Culture: NO GROWTH
Special Requests: ADEQUATE
Special Requests: ADEQUATE

## 2020-05-30 LAB — I-STAT BETA HCG BLOOD, ED (NOT ORDERABLE)

## 2022-11-24 ENCOUNTER — Encounter (HOSPITAL_COMMUNITY): Payer: Self-pay

## 2022-11-24 ENCOUNTER — Emergency Department (HOSPITAL_COMMUNITY): Payer: Medicare Other

## 2022-11-24 ENCOUNTER — Inpatient Hospital Stay (HOSPITAL_COMMUNITY)
Admission: EM | Admit: 2022-11-24 | Discharge: 2022-11-28 | DRG: 189 | Disposition: A | Discharge status: Home / Self Care | Payer: Medicare Other | Attending: Family Medicine | Admitting: Family Medicine

## 2022-11-24 ENCOUNTER — Other Ambulatory Visit: Payer: Self-pay

## 2022-11-24 DIAGNOSIS — R609 Edema, unspecified: Principal | ICD-10-CM

## 2022-11-24 DIAGNOSIS — Z85820 Personal history of malignant melanoma of skin: Secondary | ICD-10-CM

## 2022-11-24 DIAGNOSIS — Z86711 Personal history of pulmonary embolism: Secondary | ICD-10-CM

## 2022-11-24 DIAGNOSIS — Z8711 Personal history of peptic ulcer disease: Secondary | ICD-10-CM

## 2022-11-24 DIAGNOSIS — R600 Localized edema: Secondary | ICD-10-CM

## 2022-11-24 DIAGNOSIS — R918 Other nonspecific abnormal finding of lung field: Secondary | ICD-10-CM | POA: Diagnosis present

## 2022-11-24 DIAGNOSIS — E1142 Type 2 diabetes mellitus with diabetic polyneuropathy: Secondary | ICD-10-CM | POA: Diagnosis present

## 2022-11-24 DIAGNOSIS — J9601 Acute respiratory failure with hypoxia: Principal | ICD-10-CM

## 2022-11-24 DIAGNOSIS — Z7984 Long term (current) use of oral hypoglycemic drugs: Secondary | ICD-10-CM

## 2022-11-24 DIAGNOSIS — R0602 Shortness of breath: Secondary | ICD-10-CM

## 2022-11-24 DIAGNOSIS — E7800 Pure hypercholesterolemia, unspecified: Secondary | ICD-10-CM | POA: Diagnosis present

## 2022-11-24 DIAGNOSIS — J449 Chronic obstructive pulmonary disease, unspecified: Secondary | ICD-10-CM | POA: Diagnosis present

## 2022-11-24 DIAGNOSIS — R7989 Other specified abnormal findings of blood chemistry: Secondary | ICD-10-CM

## 2022-11-24 DIAGNOSIS — T451X5A Adverse effect of antineoplastic and immunosuppressive drugs, initial encounter: Secondary | ICD-10-CM | POA: Diagnosis present

## 2022-11-24 DIAGNOSIS — E1165 Type 2 diabetes mellitus with hyperglycemia: Secondary | ICD-10-CM | POA: Diagnosis present

## 2022-11-24 DIAGNOSIS — M7989 Other specified soft tissue disorders: Secondary | ICD-10-CM | POA: Diagnosis present

## 2022-11-24 DIAGNOSIS — I440 Atrioventricular block, first degree: Secondary | ICD-10-CM | POA: Diagnosis present

## 2022-11-24 DIAGNOSIS — Z923 Personal history of irradiation: Secondary | ICD-10-CM

## 2022-11-24 DIAGNOSIS — Z9221 Personal history of antineoplastic chemotherapy: Secondary | ICD-10-CM

## 2022-11-24 DIAGNOSIS — Z9049 Acquired absence of other specified parts of digestive tract: Secondary | ICD-10-CM

## 2022-11-24 DIAGNOSIS — Z87891 Personal history of nicotine dependence: Secondary | ICD-10-CM

## 2022-11-24 DIAGNOSIS — Z8249 Family history of ischemic heart disease and other diseases of the circulatory system: Secondary | ICD-10-CM

## 2022-11-24 DIAGNOSIS — Z85118 Personal history of other malignant neoplasm of bronchus and lung: Secondary | ICD-10-CM

## 2022-11-24 DIAGNOSIS — Z79899 Other long term (current) drug therapy: Secondary | ICD-10-CM

## 2022-11-24 DIAGNOSIS — E876 Hypokalemia: Secondary | ICD-10-CM | POA: Diagnosis present

## 2022-11-24 LAB — URINALYSIS, ROUTINE W REFLEX MICROSCOPIC
Bacteria, UA: NONE SEEN
Bilirubin Urine: NEGATIVE
Glucose, UA: 50 mg/dL — AB
Hgb urine dipstick: NEGATIVE
Ketones, ur: NEGATIVE mg/dL
Leukocytes,Ua: NEGATIVE
Nitrite: NEGATIVE
Protein, ur: 100 mg/dL — AB
Specific Gravity, Urine: 1.029 (ref 1.005–1.030)
pH: 5 (ref 5.0–8.0)

## 2022-11-24 LAB — COMPREHENSIVE METABOLIC PANEL
ALT: 51 U/L — ABNORMAL HIGH (ref 0–44)
AST: 38 U/L (ref 15–41)
Albumin: 3.4 g/dL — ABNORMAL LOW (ref 3.5–5.0)
Alkaline Phosphatase: 110 U/L (ref 38–126)
Anion gap: 11 (ref 5–15)
BUN: 16 mg/dL (ref 8–23)
CO2: 23 mmol/L (ref 22–32)
Calcium: 8.8 mg/dL — ABNORMAL LOW (ref 8.9–10.3)
Chloride: 106 mmol/L (ref 98–111)
Creatinine, Ser: 0.97 mg/dL (ref 0.61–1.24)
GFR, Estimated: >60 mL/min (ref >60)
Glucose, Bld: 167 mg/dL — ABNORMAL HIGH (ref 70–99)
Potassium: 3.4 mmol/L — ABNORMAL LOW (ref 3.5–5.1)
Sodium: 140 mmol/L (ref 135–145)
Total Bilirubin: 0.3 mg/dL (ref 0.3–1.2)
Total Protein: 8.6 g/dL — ABNORMAL HIGH (ref 6.5–8.1)

## 2022-11-24 LAB — CBC WITH DIFFERENTIAL/PLATELET
Abs Immature Granulocytes: 0.05 10*3/uL (ref 0.00–0.07)
Basophils Absolute: 0 10*3/uL (ref 0.0–0.1)
Basophils Relative: 0 %
Eosinophils Absolute: 0 10*3/uL (ref 0.0–0.5)
Eosinophils Relative: 0 %
HCT: 40.8 % (ref 39.0–52.0)
Hemoglobin: 13.1 g/dL (ref 13.0–17.0)
Immature Granulocytes: 1 %
Lymphocytes Relative: 25 %
Lymphs Abs: 1.7 10*3/uL (ref 0.7–4.0)
MCH: 27 pg (ref 26.0–34.0)
MCHC: 32.1 g/dL (ref 30.0–36.0)
MCV: 84.1 fL (ref 80.0–100.0)
Monocytes Absolute: 0.4 10*3/uL (ref 0.1–1.0)
Monocytes Relative: 6 %
Neutro Abs: 4.7 10*3/uL (ref 1.7–7.7)
Neutrophils Relative %: 68 %
Platelets: 216 10*3/uL (ref 150–400)
RBC: 4.85 MIL/uL (ref 4.22–5.81)
RDW: 16.2 % — ABNORMAL HIGH (ref 11.5–15.5)
WBC: 6.9 10*3/uL (ref 4.0–10.5)
nRBC: 0 % (ref 0.0–0.2)

## 2022-11-24 LAB — BRAIN NATRIURETIC PEPTIDE: B Natriuretic Peptide: 312 pg/mL — ABNORMAL HIGH (ref 0.0–100.0)

## 2022-11-24 LAB — PROTIME-INR
INR: 1 (ref 0.8–1.2)
Prothrombin Time: 13.4 seconds (ref 11.4–15.2)

## 2022-11-24 LAB — LACTIC ACID, PLASMA
Lactic Acid, Venous: 2.1 mmol/L (ref 0.5–1.9)
Lactic Acid, Venous: 1.3 mmol/L (ref 0.5–1.9)

## 2022-11-24 LAB — D-DIMER, QUANTITATIVE: D-Dimer, Quant: 1.16 ug/mL-FEU — ABNORMAL HIGH (ref 0.00–0.50)

## 2022-11-24 MED ORDER — SODIUM CHLORIDE 0.9 % IV BOLUS
500.0000 mL | Freq: Once | INTRAVENOUS | Status: AC
  Administered 2022-11-24: 500 mL via INTRAVENOUS

## 2022-11-24 MED ORDER — FUROSEMIDE 10 MG/ML IJ SOLN
20.0000 mg | Freq: Once | INTRAMUSCULAR | Status: AC
  Administered 2022-11-24: 20 mg via INTRAVENOUS
  Filled 2022-11-24: qty 2

## 2022-11-24 MED ORDER — IPRATROPIUM-ALBUTEROL 0.5-2.5 (3) MG/3ML IN SOLN: 3.0000 mL | RESPIRATORY_TRACT | Status: DC | PRN

## 2022-11-24 MED ORDER — METHYLPREDNISOLONE SODIUM SUCC 125 MG IJ SOLR
60.0000 mg | Freq: Two times a day (BID) | INTRAMUSCULAR | Status: DC
  Administered 2022-11-25 – 2022-11-27 (×7): 60 mg via INTRAVENOUS
  Filled 2022-11-24 (×7): qty 2

## 2022-11-24 MED ORDER — IOHEXOL 350 MG/ML SOLN
75.0000 mL | Freq: Once | INTRAVENOUS | Status: AC | PRN
  Administered 2022-11-24: 75 mL via INTRAVENOUS

## 2022-11-24 MED ORDER — POTASSIUM CHLORIDE CRYS ER 20 MEQ PO TBCR: 40.0000 meq | EXTENDED_RELEASE_TABLET | Freq: Once | ORAL | Status: DC

## 2022-11-24 MED ORDER — IPRATROPIUM-ALBUTEROL 0.5-2.5 (3) MG/3ML IN SOLN
3.0000 mL | Freq: Three times a day (TID) | RESPIRATORY_TRACT | Status: AC
  Administered 2022-11-24 – 2022-11-25 (×2): 3 mL via RESPIRATORY_TRACT
  Filled 2022-11-24 (×3): qty 3

## 2022-11-24 MED ORDER — POTASSIUM CHLORIDE CRYS ER 20 MEQ PO TBCR
40.0000 meq | EXTENDED_RELEASE_TABLET | Freq: Once | ORAL | Status: AC
  Administered 2022-11-24: 40 meq via ORAL
  Filled 2022-11-24: qty 2

## 2022-11-24 NOTE — ED Provider Notes (Signed)
[facility] EMERGENCY DEPARTMENT Provider Note   CSN: [id number] Arrival date & time: 11/24/22  1454     History  Chief Complaint  Patient presents with   Leg Swelling   Shortness of Breath    Daryl Vega is a 76 y.o. male.  Patient presents to the emergency department complaining of shortness of breath and bilateral pedal edema which she states he noticed 2 days ago.  Patient has extensive respiratory history including non-small cell lung cancer, pulmonary embolism history, recent pneumonia 2 months ago, and COPD.  Patient states he was seen by pulmonology in November and states that they initially recommended he come to the hospital for admission due to worsening breathing difficulty but he was not ready for admission at that time.  He states that his breathing difficulties have been ongoing for approximate 1 month blood continue to worsen.  He states that if he walks just a few feet he becomes extremely short of breath.  He denies chest pain, abdominal pain, nausea, vomiting at this time.  Denies calf swelling or pain in the legs at this time.  Past medical history otherwise significant for type 2 diabetes with peripheral neuropathy, hypokalemia, severe protein calorie malnutrition, aspiration pneumonia, sepsis, encephalopathy, polypharmacy  HPI     Home Medications Prior to Admission medications   Medication Sig Start Date End Date Taking? Authorizing Provider  acetaminophen (TYLENOL) 325 MG tablet Take 2 tablets (650 mg total) by mouth every 6 (six) hours as needed for mild pain (or Fever >/= 101). 10/01/15  Yes Robbie Lis, MD  gabapentin (NEURONTIN) 300 MG capsule Take 900 mg by mouth 3 (three) times daily.  03/30/20  Yes [provider]  glipiZIDE (GLUCOTROL XL) 10 MG 24 hr tablet Take 10 mg by mouth daily. 04/04/20  Yes [provider]  metFORMIN (GLUCOPHAGE) 500 MG tablet Take 500 mg by mouth in the morning, at noon, and at bedtime.  01/09/20  Yes  [provider]  mirtazapine (REMERON) 7.5 MG tablet Take by mouth. 11/18/22  Yes [provider]  rosuvastatin (CRESTOR) 20 MG tablet Take 20 mg by mouth at bedtime. 04/04/20  Yes [provider]  traMADol (ULTRAM) 50 MG tablet Take 50 mg by mouth every 6 (six) hours as needed for moderate pain or severe pain.  04/21/20  Yes [provider]  TRELEGY ELLIPTA 100-62.5-25 MCG/ACT AEPB Inhale 1 puff into the lungs daily. 11/10/22  Yes [provider]  albuterol (VENTOLIN HFA) 108 (90 Base) MCG/ACT inhaler Inhale 2 puffs into the lungs every 4 (four) hours as needed.    [provider]  bismuth subsalicylate (PEPTO BISMOL) 262 MG/15ML suspension Place 30 mLs into feeding tube every 4 (four) hours as needed for indigestion. Patient not taking: Reported on 05/02/2020 01/11/15   Rama, [place] Maxon, MD  clotrimazole-betamethasone (LOTRISONE) cream Apply 1 application topically 2 (two) times daily as needed (dry skin).  12/27/19   [provider]  glimepiride (AMARYL) 4 MG tablet Take by mouth.    [provider]  temazepam (RESTORIL) 15 MG capsule Take by mouth.    [provider]      Allergies    Patient has no known allergies.    Review of Systems   Review of Systems  Constitutional:  Negative for fever.  Respiratory:  Positive for shortness of breath and wheezing. Negative for cough.   Cardiovascular:  Negative for chest pain and palpitations.       Pedal/ankle  swelling  Gastrointestinal:  Negative for abdominal pain, nausea and vomiting.    Physical Exam Updated Vital Signs BP (!) 141/87   Pulse 95   Temp 97.6 F (36.4 C) (Oral)   Resp 20   Ht 5\' 8"  (1.727 m)   Wt 74.4 kg   SpO2 96%   BMI 24.94 kg/m  Physical Exam Vitals and nursing note reviewed.  Constitutional:      General: He is not in acute distress.    Appearance: He is well-developed.  HENT:     Head: Normocephalic and atraumatic.  Eyes:      Extraocular Movements: Extraocular movements intact.     Conjunctiva/sclera: Conjunctivae normal.  Cardiovascular:     Rate and Rhythm: Normal rate and regular rhythm.     Heart sounds: No murmur heard. Pulmonary:     Effort: Pulmonary effort is normal. No respiratory distress.     Breath sounds: Wheezing present. No rhonchi or rales.  Chest:     Chest wall: No tenderness.  Abdominal:     Palpations: Abdomen is soft.     Tenderness: There is no abdominal tenderness.  Musculoskeletal:        General: No swelling.     Cervical back: Normal range of motion and neck supple.     Comments: Non-pitting bilateral pedal edema  Skin:    General: Skin is warm and dry.     Capillary Refill: Capillary refill takes less than 2 seconds.  Neurological:     General: No focal deficit present.     Mental Status: He is alert.  Psychiatric:        Mood and Affect: Mood normal.     ED Results / Procedures / Treatments   Labs (all labs ordered are listed, but only abnormal results are displayed) Labs Reviewed  COMPREHENSIVE METABOLIC PANEL - Abnormal; Notable for the following components:      Result Value   Potassium 3.4 (*)    Glucose, Bld 167 (*)    Calcium 8.8 (*)    Total Protein 8.6 (*)    Albumin 3.4 (*)    ALT 51 (*)    All other components within normal limits  LACTIC ACID, PLASMA - Abnormal; Notable for the following components:   Lactic Acid, Venous 2.1 (*)    All other components within normal limits  CBC WITH DIFFERENTIAL/PLATELET - Abnormal; Notable for the following components:   RDW 16.2 (*)    All other components within normal limits  URINALYSIS, ROUTINE W REFLEX MICROSCOPIC - Abnormal; Notable for the following components:   Glucose, UA 50 (*)    Protein, ur 100 (*)    All other components within normal limits  BRAIN NATRIURETIC PEPTIDE - Abnormal; Notable for the following components:   B Natriuretic Peptide 312.0 (*)    All other components within normal limits   D-DIMER, QUANTITATIVE - Abnormal; Notable for the following components:   D-Dimer, Quant 1.16 (*)    All other components within normal limits  CULTURE, BLOOD (ROUTINE X 2)  CULTURE, BLOOD (ROUTINE X 2)  LACTIC ACID, PLASMA  PROTIME-INR    EKG None  Radiology CT Angio Chest PE W and/or Wo Contrast  Result Date: 11/24/2022 CLINICAL DATA:  Positive D-dimer and difficulty breathing EXAM: CT ANGIOGRAPHY CHEST WITH CONTRAST TECHNIQUE: Multidetector CT imaging of the chest was performed using the standard protocol during bolus administration of intravenous contrast. Multiplanar CT image reconstructions and MIPs were obtained to evaluate the vascular anatomy.  RADIATION DOSE REDUCTION: This exam was performed according to the departmental dose-optimization program which includes automated exposure control, adjustment of the mA and/or kV according to patient size and/or use of iterative reconstruction technique. CONTRAST:  51mL OMNIPAQUE IOHEXOL 350 MG/ML SOLN COMPARISON:  05/02/2020 CT, chest x-ray from earlier in the same day. FINDINGS: Cardiovascular: Atherosclerotic calcifications are noted. No aneurysmal dilatation or dissection is seen. No cardiac enlargement is noted. Heavy coronary calcifications are seen. The pulmonary artery is well visualized bilaterally. No filling defects to suggest pulmonary embolism are noted. Mediastinum/Nodes: Thoracic inlet is within normal limits. Mediastinal shift to the right is noted secondary to volume loss related to the prior treatment. Left hilar lymph node is noted measuring up to 1.2 cm. A few smaller nodes are noted as well. No significant subcarinal adenopathy is seen. Soft tissue density is noted along the right paratracheal space which appears to extend through the trachea. This is best seen on image number 102 of series 5. Generalized increased soft tissue density around the right mainstem bronchus is seen. The possibility of recurrent neoplasm could not be  totally excluded. Lungs/Pleura: Volume loss is noted in the right upper lobe consistent with the prior therapy. Persistent and slightly increased consolidation in the right apex is noted. Scarring is noted in the lingula and left lower lobe posteriorly. No new focal infiltrate is seen. No parenchymal nodules are noted. Emphysematous changes seen. Chronic scarring in the right base is noted as well as a small pleural effusion slightly increased from the prior study. Upper Abdomen: Stable right adrenal lesion is noted measuring up to 2.2 cm. This demonstrates Hounsfield unit of -16 and is consistent with a stable adenoma. The remainder of the upper abdomen is within normal limits. Musculoskeletal: Degenerative changes of the thoracic spine are seen. No acute rib abnormality is noted. Review of the MIP images confirms the above findings. IMPRESSION: Progressive post radiation changes with increased volume loss in the right apex. Some increase in soft tissue density surrounding the right mainstem bronchus is noted with findings suspicious for soft tissue ingrowth into the trachea through the tracheal wall. These changes are new from the prior exam. Further workup is recommended as this could represent progressive neoplasm. No evidence of pulmonary emboli. 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 Aortic Atherosclerosis (ICD10-I70.0). Electronically Signed   By: Inez Catalina M.D.   On: 11/24/2022 19:23   DG Chest Port 1 View  Result Date: 11/24/2022 CLINICAL DATA:  Sepsis EXAM: PORTABLE CHEST 1 VIEW COMPARISON:  05/02/2020 FINDINGS: Grossly stable cardiomediastinal contours with significant volume loss in the right hemithorax. Left lung is clear. Probable small right pleural effusion. No pneumothorax. IMPRESSION: Chronic post treatment changes within the right hemithorax. No evidence of a superimposed acute cardiopulmonary process. Electronically Signed   By: Davina Poke D.O.   On: 11/24/2022 16:36     Procedures Procedures    Medications Ordered in ED Medications  sodium chloride 0.9 % bolus 500 mL (500 mLs Intravenous New Bag/Given 11/24/22 1852)  iohexol (OMNIPAQUE) 350 MG/ML injection 75 mL (75 mLs Intravenous Contrast Given 11/24/22 1850)  furosemide (LASIX) injection 20 mg (20 mg Intravenous Given 11/24/22 2031)  potassium chloride SA (KLOR-CON M) CR tablet 40 mEq (40 mEq Oral Given 11/24/22 2031)    ED Course/ Medical Decision Making/ A&P                           Medical Decision Making Amount  and/or Complexity of Data Reviewed Labs: ordered. Radiology: ordered.  Risk Prescription drug management. Decision regarding hospitalization.   This patient presents to the ED for concern of shortness of breath, this involves an extensive number of treatment options, and is a complaint that carries with it a high risk of complications and morbidity.  The differential diagnosis includes pulmonary embolism, heart failure, ACS, pneumonia, COPD exacerbation, and others   Co morbidities that complicate the patient evaluation  History diabetes, COPD, non-small cell lung cancer, protein calorie malnutrition   Additional history obtained:  External records from outside source obtained and reviewed including family medicine notes from last week where the patient was evaluated due to rapid weight loss and pulmonology notes from November 27 for the patient was evaluated for dyspnea   Lab Tests:  I Ordered, and personally interpreted labs.  The pertinent results include: BNP 312; CMP with potassium 3.4, glucose 167, albumin 3.4, ALT 51; D-dimer 1.16; grossly normal CBC; initial lactic acid 2.1   Imaging Studies ordered:  I ordered imaging studies including chest x-ray and CT angio chest PE study I independently visualized and interpreted imaging which showed  Chronic post treatment changes within the right hemithorax. No  evidence of a superimposed acute cardiopulmonary  process.   Progressive post radiation changes with increased volume loss in the  right apex. Some increase in soft tissue density surrounding the  right mainstem bronchus is noted with findings suspicious for soft  tissue ingrowth into the trachea through the tracheal wall. These  changes are new from the prior exam. Further workup is recommended  as this could represent progressive neoplasm.    No evidence of pulmonary emboli.    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    Aortic Atherosclerosis   I agree with the radiologist interpretation   Cardiac Monitoring: / EKG:  The patient was maintained on a cardiac monitor.  I personally viewed and interpreted the cardiac monitored which showed an underlying rhythm of: Sinus tachycardia   Consultations Obtained:  I requested consultation with the hospitalist, Dr.Emokpae,  and discussed lab and imaging findings as well as pertinent plan - they recommend: admission   Problem List / ED Course / Critical interventions / Medication management   I ordered medication including normal saline for possible dehydration/tachycardia, Lasix for fluid overload, potassium for hypokalemia Reevaluation of the patient after these medicines showed that the patient stayed the same I have reviewed the patients home medicines and have made adjustments as needed    Test / Admission - Considered:  Patient has increased work of breathing.  Patient is unable to walk from the bed to the bathroom without becoming significantly tachypneic and short of breath.  Patient appears to be somewhat fluid overloaded.  Patient would benefit from admission for further workup and management        Final Clinical Impression(s) / ED Diagnoses Final diagnoses:  Peripheral edema  Shortness of breath  Elevated brain natriuretic peptide (BNP) level    Rx / DC Orders ED Discharge Orders     None         Ronny Bacon 11/24/22 2222     Godfrey Pick, MD 11/25/22 0030

## 2022-11-24 NOTE — ED Notes (Signed)
Patient assisted to the bathroom 

## 2022-11-24 NOTE — ED Notes (Signed)
Pt ambulated in the hallway without assistance. Pt O2 drop from 98 to 92 on room air. Pt begin to stop mid way to catch breath. Pt states he feels alright after returning to his room.

## 2022-11-24 NOTE — ED Notes (Signed)
Pt placed on 2L [place] for desats from 85-89%

## 2022-11-24 NOTE — Assessment & Plan Note (Addendum)
Reports he has been on remission for 7 years, status post radiation and chemotherapy.  Primary pulmonology- Dr. Michela Pitcher and oncology with [facility]. -Status post bronchoscopy by Dr. Elsworth Soho 11/25/2022  -Left sided airways appeared patent.Mild amount of white secretions were noted. No endobronchial lesions seen.  Right mainstem bronchus was narrowed immediately after takeoff, the narrowing could not be traversed.  No endobronchial lesion noted. Carina did not show mass, mucosa appeared slightly inflamed BAL obtained from right mainstem bronchus.  Brushings were obtained from right mainstem bronchus and from carina  Specimen sent

## 2022-11-24 NOTE — H&P (Signed)
History and Physical    Brison Fiumara [facility] RJJ:[id number] DOB: [birth year] DOA: 11/24/2022  PCP: Nelly Laurence, NP   Patient coming from: Home  I have personally briefly reviewed patient's old medical records in [place]  Chief Complaint: Difficulty Breathing  HPI: Daryl Vega is a 76 y.o. male with medical history significant for COPD, diabetes mellitus, pulmonary embolism, lung cancer. Patient presented to the ED with complaints of persistent difficulty breathing over the past month.  He denies worsening cough.  Reports he was diagnosed with pneumonia about 2 months ago, completed a course of Augmentin and Z-Pak subsequently Levaquin due to persistent of symptoms.  He reports gradual worsening of his breathing mostly with activity which improves when he stops to catch his breath.  No fevers no chills.  No chest pain.  Reports some mild lower extremity swelling just around his ankles.  He reports he no longer takes Eliquis.  He is not on home O2.  ED Course: O2 sats dropped to 85% on room air with activity.  At rest sats greater than 96% on room air.  Heart rate 95-134.  Respirate rate 18-28.  Dimer 1.16.  BNP 312.  Lactic acid 2.1 > 1.3. X-ray showing chronic posttreatment changes within the right hemithorax.   Subsequent CTA chest-progressive postradiation changes with increased volume loss in the right apex, some increase in soft tissue density surrounding the right mainstem bronchus suspicious for soft tissue ingrowth into the trachea through the tracheal wall.  New from prior exam.  May represent progressive neoplasm. 500 mill bolus, and then 20 mg of Lasix given hospitalist called to admit for acute hypoxic respiratory failure.  Review of Systems: As per HPI all other systems reviewed and negative.  Past Medical History:  Diagnosis Date   Anemia    C. difficile enteritis    Cholecystoduodenal fistula    COPD (chronic obstructive pulmonary disease) (HCC)    DM  (diabetes mellitus) (HCC)    Duodenal ulcer    Elevated LFTs    FH: chemotherapy    Gastroparesis 02/09/2015   Hypercholesteremia    Lung cancer ([place])    Melanoma ([place])    Pneumonia    Protein calorie malnutrition ([place])    Pulmonary embolus ([place])    Radiation 08/03/14-08/23/14   35 gray to right chest    Past Surgical History:  Procedure Laterality Date   CHOLECYSTECTOMY     duodenostomy tube     ESOPHAGOGASTRODUODENOSCOPY N/A 11/30/2014   Procedure: ESOPHAGOGASTRODUODENOSCOPY (EGD);  Surgeon: [name], MD;  Location: [facility];  Service: Endoscopy;  Laterality: N/A;   JEJUNOSTOMY FEEDING TUBE     LAPAROTOMY N/A 11/30/2014   Procedure: EXPLORATORY LAPAROTOMY, PYLOROPLASTY, OVERSEWING OF POSTERIOR DUODENUM, DUODENOSTOMY, CHOLECYSTECTOMY, JEJEUNOSTOMY;  Surgeon: Jackolyn Confer, MD;  Location: [facility];  Service: General;  Laterality: N/A;   PYLOROPLASTY     VIDEO BRONCHOSCOPY Bilateral 07/20/2014   Procedure: VIDEO BRONCHOSCOPY WITHOUT FLUORO;  Surgeon: Tanda Rockers, MD;  Location: [facility];  Service: Cardiopulmonary;  Laterality: Bilateral;     reports that he quit smoking about 8 years ago. His smoking use included cigarettes and cigars. He has a 100.00 pack-year smoking history. He quit smokeless tobacco use about 10 years ago.  His smokeless tobacco use included chew. He reports that he does not drink alcohol and does not use drugs.  No Known Allergies  Family History  Problem Relation Age of Onset   Heart disease Mother    Cancer Mother        ?  type    Prior to Admission medications   Medication Sig Start Date End Date Taking? Authorizing Provider  acetaminophen (TYLENOL) 325 MG tablet Take 2 tablets (650 mg total) by mouth every 6 (six) hours as needed for mild pain (or Fever >/= 101). 10/01/15  Yes Robbie Lis, MD  gabapentin (NEURONTIN) 300 MG capsule Take 900 mg by mouth 3 (three) times daily.  03/30/20  Yes [provider]  glipiZIDE (GLUCOTROL  XL) 10 MG 24 hr tablet Take 10 mg by mouth daily. 04/04/20  Yes [provider]  metFORMIN (GLUCOPHAGE) 500 MG tablet Take 500 mg by mouth in the morning, at noon, and at bedtime.  01/09/20  Yes [provider]  mirtazapine (REMERON) 7.5 MG tablet Take by mouth. 11/18/22  Yes [provider]  rosuvastatin (CRESTOR) 20 MG tablet Take 20 mg by mouth at bedtime. 04/04/20  Yes [provider]  traMADol (ULTRAM) 50 MG tablet Take 50 mg by mouth every 6 (six) hours as needed for moderate pain or severe pain.  04/21/20  Yes [provider]  TRELEGY ELLIPTA 100-62.5-25 MCG/ACT AEPB Inhale 1 puff into the lungs daily. 11/10/22  Yes [provider]  albuterol (VENTOLIN HFA) 108 (90 Base) MCG/ACT inhaler Inhale 2 puffs into the lungs every 4 (four) hours as needed.    [provider]  bismuth subsalicylate (PEPTO BISMOL) 262 MG/15ML suspension Place 30 mLs into feeding tube every 4 (four) hours as needed for indigestion. Patient not taking: Reported on 05/02/2020 01/11/15   Rama, Venetia Maxon, MD  clotrimazole-betamethasone (LOTRISONE) cream Apply 1 application topically 2 (two) times daily as needed (dry skin).  12/27/19   [provider]  glimepiride (AMARYL) 4 MG tablet Take by mouth.    [provider]  temazepam (RESTORIL) 15 MG capsule Take by mouth.    [provider]    Physical Exam: Vitals:   11/24/22 1854 11/24/22 1930 11/24/22 2047 11/24/22 2130  BP:  138/83 (!) 140/88 (!) 141/87  Pulse:  98 100 95  Resp:  (!) 24 18 20   Temp: 98.4 F (36.9 C)  97.6 F (36.4 C)   TempSrc: Oral  Oral   SpO2:  99% 100% 96%  Weight:      Height:        Constitutional: NAD, calm, comfortable Vitals:   11/24/22 1854 11/24/22 1930 11/24/22 2047 11/24/22 2130  BP:  138/83 (!) 140/88 (!) 141/87  Pulse:  98 100 95  Resp:  (!) 24 18 20   Temp: 98.4 F (36.9 C)  97.6 F (36.4 C)   TempSrc: Oral  Oral   SpO2:  99% 100% 96%   Weight:      Height:       Eyes: PERRL, lids and conjunctivae normal ENMT: Mucous membranes are moist.  Neck: normal, supple, no masses, no thyromegaly Respiratory: Faint bilateral expiratory wheezing, Normal respiratory effort. No accessory muscle use.  Cardiovascular: Regular rate and rhythm, no murmurs / rubs / gallops. No extremity edema. 2+ pedal pulses. No carotid bruits.  Abdomen: no tenderness, no masses palpated. No hepatosplenomegaly.  Musculoskeletal: no clubbing / cyanosis. No joint deformity upper and lower extremities.   Skin: no rashes, lesions, ulcers. No induration Neurologic: No apparent cranial nerve abnormalities, moving extremities spontaneously.[name]  Psychiatric: Normal judgment and insight. Alert and oriented x 3. Normal mood.   Labs on Admission: I have personally reviewed following labs and imaging studies  CBC: Recent Labs  Lab 11/24/22 1646  WBC 6.9  NEUTROABS 4.7  HGB 13.1  HCT 40.8  MCV 84.1  PLT 703   Basic Metabolic Panel: Recent Labs  Lab 11/24/22 1646  NA 140  K 3.4*  CL 106  CO2 23  GLUCOSE 167*  BUN 16  CREATININE 0.97  CALCIUM 8.8*   GFR: Estimated Creatinine Clearance: 63.7 mL/min (by C-G formula based on SCr of 0.97 mg/dL). Liver Function Tests: Recent Labs  Lab 11/24/22 1646  AST 38  ALT 51*  ALKPHOS 110  BILITOT 0.3  PROT 8.6*  ALBUMIN 3.4*   Coagulation Profile: Recent Labs  Lab 11/24/22 1646  INR 1.0   Urine analysis:    Component Value Date/Time   COLORURINE YELLOW 11/24/2022 [place] 11/24/2022 1635   LABSPEC 1.029 11/24/2022 1635   PHURINE 5.0 11/24/2022 1635   GLUCOSEU 50 (A) 11/24/2022 1635   HGBUR NEGATIVE 11/24/2022 1635   BILIRUBINUR NEGATIVE 11/24/2022 1635   KETONESUR NEGATIVE 11/24/2022 1635   PROTEINUR 100 (A) 11/24/2022 1635   UROBILINOGEN 1.0 09/24/2015 1907   NITRITE NEGATIVE 11/24/2022 1635   LEUKOCYTESUR NEGATIVE 11/24/2022 1635    Radiological Exams on Admission: CT  Angio Chest PE W and/or Wo Contrast  Result Date: 11/24/2022 CLINICAL DATA:  Positive D-dimer and difficulty breathing EXAM: CT ANGIOGRAPHY CHEST WITH CONTRAST TECHNIQUE: Multidetector CT imaging of the chest was performed using the standard protocol during bolus administration of intravenous contrast. Multiplanar CT image reconstructions and MIPs were obtained to evaluate the vascular anatomy. RADIATION DOSE REDUCTION: This exam was performed according to the departmental dose-optimization program which includes automated exposure control, adjustment of the mA and/or kV according to patient size and/or use of iterative reconstruction technique. CONTRAST:  55mL OMNIPAQUE IOHEXOL 350 MG/ML SOLN COMPARISON:  05/02/2020 CT, chest x-ray from earlier in the same day. FINDINGS: Cardiovascular: Atherosclerotic calcifications are noted. No aneurysmal dilatation or dissection is seen. No cardiac enlargement is noted. Heavy coronary calcifications are seen. The pulmonary artery is well visualized bilaterally. No filling defects to suggest pulmonary embolism are noted. Mediastinum/Nodes: Thoracic inlet is within normal limits. Mediastinal shift to the right is noted secondary to volume loss related to the prior treatment. Left hilar lymph node is noted measuring up to 1.2 cm. A few smaller nodes are noted as well. No significant subcarinal adenopathy is seen. Soft tissue density is noted along the right paratracheal space which appears to extend through the trachea. This is best seen on image number 102 of series 5. Generalized increased soft tissue density around the right mainstem bronchus is seen. The possibility of recurrent neoplasm could not be totally excluded. Lungs/Pleura: Volume loss is noted in the right upper lobe consistent with the prior therapy. Persistent and slightly increased consolidation in the right apex is noted. Scarring is noted in the lingula and left lower lobe posteriorly. No new focal infiltrate  is seen. No parenchymal nodules are noted. Emphysematous changes seen. Chronic scarring in the right base is noted as well as a small pleural effusion slightly increased from the prior study. Upper Abdomen: Stable right adrenal lesion is noted measuring up to 2.2 cm. This demonstrates Hounsfield unit of -16 and is consistent with a stable adenoma. The remainder of the upper abdomen is within normal limits. Musculoskeletal: Degenerative changes of the thoracic spine are seen. No acute rib abnormality is noted. Review of the MIP images confirms the above findings. IMPRESSION: Progressive post radiation changes with increased volume loss in the right apex. Some increase in soft  tissue density surrounding the right mainstem bronchus is noted with findings suspicious for soft tissue ingrowth into the trachea through the tracheal wall. These changes are new from the prior exam. Further workup is recommended as this could represent progressive neoplasm. No evidence of pulmonary emboli. 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 Aortic Atherosclerosis (ICD10-I70.0). Electronically Signed   By: Inez Catalina M.D.   On: 11/24/2022 19:23   DG Chest Port 1 View  Result Date: 11/24/2022 CLINICAL DATA:  Sepsis EXAM: PORTABLE CHEST 1 VIEW COMPARISON:  05/02/2020 FINDINGS: Grossly stable cardiomediastinal contours with significant volume loss in the right hemithorax. Left lung is clear. Probable small right pleural effusion. No pneumothorax. IMPRESSION: Chronic post treatment changes within the right hemithorax. No evidence of a superimposed acute cardiopulmonary process. Electronically Signed   By: Davina Poke D.O.   On: 11/24/2022 16:36    EKG: Independently reviewed.  Sinus tachycardia rate 110.  First-degree AV block block PR interval 234.  QTc 406.  LVH.  T wave abnormalities similar to EKG from 2016.  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  Not on home O2.  BNP 312, no significant signs of volume overload on exam.  Faint bilateral wheezing.  CTA chest- Subsequent CTA chest-progressive postradiation changes with increased volume loss in the right apex, some increase in soft tissue density surrounding the right mainstem bronchus suspicious for soft tissue ingrowth into the trachea through the tracheal wall.  New from prior exam.  May represent progressive neoplasm. -Possible mild component of COPD exacerbation, steroids and DuoNebs for now -Talked to PCCM on-call at [facility], Dr Carlis Abbott, okay to stay at [facility], pulmonology to evaluate here first and decide if transfer needed for- soft tissue ingrowth into the trachea. -Please consult pulmonology here in a.m.   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  Lives with pulmonology- Dr. Michela Pitcher and oncology with [facility] health.  COPD mixed type (HCC) Faint wheezing.  Stable unchanged/chronic mild cough.  Completed 2 courses of antibiotics in the past 2 months for pneumonia. -DuoNebs for now -Steroids. -Hold off on antibiotics for now   DVT prophylaxis: Heparin Code Status:Full Family Communication: None at bedside Disposition Plan: ~ 2 days Consults called: Pls consult Pulm in a.m Admission status:  Obs tele    Author: Bethena Roys, MD 11/24/2022 11:28 PM  For on call review [email or web address].

## 2022-11-24 NOTE — ED Notes (Signed)
Pt became extremely SHOB when ambulating ~10 feet to the restroom .

## 2022-11-24 NOTE — ED Triage Notes (Signed)
Pt presents with ShOB. Pt reports he was dx with pneumonia 2 month ago. Pt noted to have labored breathing in triage however, pt states there has been no change in his work of breathing in the last 2 months. Pt is primarily concerned with LE edema that started 2 days ago. Denies orthopnea.

## 2022-11-24 NOTE — Assessment & Plan Note (Addendum)
-   Hgba1c 8.1 - Hold glipizide and metformin -Anticipating hyperglycemia due to steroids -Will check CBG q. ACHS, SSI coverage

## 2022-11-24 NOTE — Assessment & Plan Note (Addendum)
Faint wheezing.  Stable unchanged/chronic mild cough.  Completed 2 courses of antibiotics in the past 2 months for pneumonia. -DuoNebs for now -Steroids. -Hold off on antibiotics for now

## 2022-11-24 NOTE — Assessment & Plan Note (Addendum)
O2 sats down to 85% on room air with ambulation.  Not on home O2.  BNP 312, no significant signs of volume overload on exam.  Faint bilateral wheezing.  CTA chest- Subsequent CTA chest-progressive postradiation changes with increased volume loss in the right apex, some increase in soft tissue density surrounding the right mainstem bronchus suspicious for soft tissue ingrowth into the trachea through the tracheal wall.  New from prior exam.  May represent progressive neoplasm. -Possible mild component of COPD exacerbation, steroids and DuoNebs for now -Talked to PCCM on-call at [facility], Dr Carlis Abbott, okay to stay at [facility], pulmonology to evaluate here first and decide if transfer needed for- soft tissue ingrowth into the trachea. -Please consult pulmonology here in a.m.

## 2022-11-25 ENCOUNTER — Observation Stay (HOSPITAL_COMMUNITY): Payer: Medicare Other | Admitting: Anesthesiology

## 2022-11-25 ENCOUNTER — Encounter (HOSPITAL_COMMUNITY)
Admission: EM | Disposition: A | Discharge status: Home / Self Care | Payer: Self-pay | Source: Home / Self Care | Attending: Family Medicine

## 2022-11-25 ENCOUNTER — Encounter (HOSPITAL_COMMUNITY): Payer: Self-pay | Admitting: Internal Medicine

## 2022-11-25 DIAGNOSIS — E7800 Pure hypercholesterolemia, unspecified: Secondary | ICD-10-CM | POA: Diagnosis present

## 2022-11-25 DIAGNOSIS — C3411 Malignant neoplasm of upper lobe, right bronchus or lung: Secondary | ICD-10-CM | POA: Diagnosis not present

## 2022-11-25 DIAGNOSIS — T451X5A Adverse effect of antineoplastic and immunosuppressive drugs, initial encounter: Secondary | ICD-10-CM | POA: Diagnosis present

## 2022-11-25 DIAGNOSIS — Z9049 Acquired absence of other specified parts of digestive tract: Secondary | ICD-10-CM | POA: Diagnosis not present

## 2022-11-25 DIAGNOSIS — J449 Chronic obstructive pulmonary disease, unspecified: Secondary | ICD-10-CM

## 2022-11-25 DIAGNOSIS — Z923 Personal history of irradiation: Secondary | ICD-10-CM | POA: Diagnosis not present

## 2022-11-25 DIAGNOSIS — Z87891 Personal history of nicotine dependence: Secondary | ICD-10-CM

## 2022-11-25 DIAGNOSIS — Z8249 Family history of ischemic heart disease and other diseases of the circulatory system: Secondary | ICD-10-CM | POA: Diagnosis not present

## 2022-11-25 DIAGNOSIS — Z7984 Long term (current) use of oral hypoglycemic drugs: Secondary | ICD-10-CM | POA: Diagnosis not present

## 2022-11-25 DIAGNOSIS — Z85118 Personal history of other malignant neoplasm of bronchus and lung: Secondary | ICD-10-CM | POA: Diagnosis not present

## 2022-11-25 DIAGNOSIS — R918 Other nonspecific abnormal finding of lung field: Secondary | ICD-10-CM | POA: Diagnosis present

## 2022-11-25 DIAGNOSIS — E1142 Type 2 diabetes mellitus with diabetic polyneuropathy: Secondary | ICD-10-CM | POA: Diagnosis present

## 2022-11-25 DIAGNOSIS — J9601 Acute respiratory failure with hypoxia: Secondary | ICD-10-CM | POA: Diagnosis present

## 2022-11-25 DIAGNOSIS — M7989 Other specified soft tissue disorders: Secondary | ICD-10-CM | POA: Diagnosis present

## 2022-11-25 DIAGNOSIS — E1165 Type 2 diabetes mellitus with hyperglycemia: Secondary | ICD-10-CM | POA: Diagnosis present

## 2022-11-25 DIAGNOSIS — E119 Type 2 diabetes mellitus without complications: Secondary | ICD-10-CM

## 2022-11-25 DIAGNOSIS — Z8711 Personal history of peptic ulcer disease: Secondary | ICD-10-CM | POA: Diagnosis not present

## 2022-11-25 DIAGNOSIS — I440 Atrioventricular block, first degree: Secondary | ICD-10-CM | POA: Diagnosis present

## 2022-11-25 DIAGNOSIS — E876 Hypokalemia: Secondary | ICD-10-CM | POA: Diagnosis present

## 2022-11-25 DIAGNOSIS — Z85820 Personal history of malignant melanoma of skin: Secondary | ICD-10-CM | POA: Diagnosis not present

## 2022-11-25 DIAGNOSIS — Z79899 Other long term (current) drug therapy: Secondary | ICD-10-CM | POA: Diagnosis not present

## 2022-11-25 DIAGNOSIS — Z86711 Personal history of pulmonary embolism: Secondary | ICD-10-CM | POA: Diagnosis not present

## 2022-11-25 DIAGNOSIS — R0602 Shortness of breath: Secondary | ICD-10-CM | POA: Diagnosis present

## 2022-11-25 DIAGNOSIS — Z9221 Personal history of antineoplastic chemotherapy: Secondary | ICD-10-CM | POA: Diagnosis not present

## 2022-11-25 HISTORY — PX: FLEXIBLE BRONCHOSCOPY: SHX5094

## 2022-11-25 LAB — BASIC METABOLIC PANEL
Anion gap: 8 (ref 5–15)
BUN: 14 mg/dL (ref 8–23)
CO2: 23 mmol/L (ref 22–32)
Calcium: 8.6 mg/dL — ABNORMAL LOW (ref 8.9–10.3)
Chloride: 109 mmol/L (ref 98–111)
Creatinine, Ser: 0.87 mg/dL (ref 0.61–1.24)
GFR, Estimated: >60 mL/min (ref >60)
Glucose, Bld: 156 mg/dL — ABNORMAL HIGH (ref 70–99)
Potassium: 4.6 mmol/L (ref 3.5–5.1)
Sodium: 140 mmol/L (ref 135–145)

## 2022-11-25 LAB — CBC
HCT: 43.5 % (ref 39.0–52.0)
Hemoglobin: 13.6 g/dL (ref 13.0–17.0)
MCH: 26.8 pg (ref 26.0–34.0)
MCHC: 31.3 g/dL (ref 30.0–36.0)
MCV: 85.8 fL (ref 80.0–100.0)
Platelets: 198 10*3/uL (ref 150–400)
RBC: 5.07 MIL/uL (ref 4.22–5.81)
RDW: 16.2 % — ABNORMAL HIGH (ref 11.5–15.5)
WBC: 7.7 10*3/uL (ref 4.0–10.5)
nRBC: 0 % (ref 0.0–0.2)

## 2022-11-25 LAB — GLUCOSE, CAPILLARY
Glucose-Capillary: 242 mg/dL — ABNORMAL HIGH (ref 70–99)
Glucose-Capillary: 255 mg/dL — ABNORMAL HIGH (ref 70–99)
Glucose-Capillary: 222 mg/dL — ABNORMAL HIGH (ref 70–99)
Glucose-Capillary: 366 mg/dL — ABNORMAL HIGH (ref 70–99)

## 2022-11-25 LAB — CBG MONITORING, ED: Glucose-Capillary: 88 mg/dL (ref 70–99)

## 2022-11-25 SURGERY — BRONCHOSCOPY, FLEXIBLE
Anesthesia: General | Laterality: Bilateral

## 2022-11-25 MED ORDER — ONDANSETRON HCL 4 MG PO TABS: 4.0000 mg | ORAL_TABLET | Freq: Four times a day (QID) | ORAL | Status: DC | PRN

## 2022-11-25 MED ORDER — EPINEPHRINE PF 1 MG/ML IJ SOLN
INTRAMUSCULAR | Status: AC
  Filled 2022-11-25: qty 3

## 2022-11-25 MED ORDER — ONDANSETRON HCL 4 MG/2ML IJ SOLN
INTRAMUSCULAR | Status: AC
  Filled 2022-11-25: qty 6

## 2022-11-25 MED ORDER — SUGAMMADEX SODIUM 200 MG/2ML IV SOLN
INTRAVENOUS | Status: DC | PRN
  Administered 2022-11-25: 200 mg via INTRAVENOUS

## 2022-11-25 MED ORDER — ONDANSETRON HCL 4 MG/2ML IJ SOLN: 4.0000 mg | Freq: Four times a day (QID) | INTRAMUSCULAR | Status: DC | PRN

## 2022-11-25 MED ORDER — ALBUTEROL SULFATE HFA 108 (90 BASE) MCG/ACT IN AERS
INHALATION_SPRAY | RESPIRATORY_TRACT | Status: DC | PRN
  Administered 2022-11-25: 4 via RESPIRATORY_TRACT

## 2022-11-25 MED ORDER — ROCURONIUM BROMIDE 10 MG/ML (PF) SYRINGE
PREFILLED_SYRINGE | INTRAVENOUS | Status: DC | PRN
  Administered 2022-11-25: 50 mg via INTRAVENOUS

## 2022-11-25 MED ORDER — HEPARIN SODIUM (PORCINE) 5000 UNIT/ML IJ SOLN
5000.0000 [IU] | Freq: Three times a day (TID) | INTRAMUSCULAR | Status: DC
  Administered 2022-11-25 – 2022-11-28 (×9): 5000 [IU] via SUBCUTANEOUS
  Filled 2022-11-25 (×9): qty 1

## 2022-11-25 MED ORDER — PROPOFOL 10 MG/ML IV BOLUS
INTRAVENOUS | Status: DC | PRN
  Administered 2022-11-25: 150 mg via INTRAVENOUS

## 2022-11-25 MED ORDER — INSULIN ASPART 100 UNIT/ML IJ SOLN
0.0000 [IU] | Freq: Every day | INTRAMUSCULAR | Status: DC
  Administered 2022-11-25: 5 [IU] via SUBCUTANEOUS

## 2022-11-25 MED ORDER — FENTANYL CITRATE (PF) 100 MCG/2ML IJ SOLN
INTRAMUSCULAR | Status: AC
  Filled 2022-11-25: qty 2

## 2022-11-25 MED ORDER — FLUTICASONE FUROATE-VILANTEROL 100-25 MCG/ACT IN AEPB
1.0000 | INHALATION_SPRAY | Freq: Every day | RESPIRATORY_TRACT | Status: DC
  Administered 2022-11-25 – 2022-11-28 (×4): 1 via RESPIRATORY_TRACT
  Filled 2022-11-25: qty 28

## 2022-11-25 MED ORDER — POLYETHYLENE GLYCOL 3350 17 G PO PACK: 17.0000 g | PACK | Freq: Every day | ORAL | Status: DC | PRN

## 2022-11-25 MED ORDER — FENTANYL CITRATE (PF) 100 MCG/2ML IJ SOLN
INTRAMUSCULAR | Status: DC | PRN
  Administered 2022-11-25: 50 ug via INTRAVENOUS

## 2022-11-25 MED ORDER — LACTATED RINGERS IV SOLN: INTRAVENOUS | Status: DC | PRN

## 2022-11-25 MED ORDER — INFLUENZA VAC A&B SA ADJ QUAD 0.5 ML IM PRSY
0.5000 mL | PREFILLED_SYRINGE | INTRAMUSCULAR | Status: DC
  Filled 2022-11-25: qty 0.5

## 2022-11-25 MED ORDER — INSULIN ASPART 100 UNIT/ML IJ SOLN
0.0000 [IU] | Freq: Three times a day (TID) | INTRAMUSCULAR | Status: DC
  Administered 2022-11-25 (×2): 5 [IU] via SUBCUTANEOUS
  Administered 2022-11-25 – 2022-11-26 (×2): 8 [IU] via SUBCUTANEOUS

## 2022-11-25 MED ORDER — PNEUMOCOCCAL 20-VAL CONJ VACC 0.5 ML IM SUSY
0.5000 mL | PREFILLED_SYRINGE | INTRAMUSCULAR | Status: DC
  Filled 2022-11-25: qty 0.5

## 2022-11-25 MED ORDER — FENTANYL CITRATE (PF) 100 MCG/2ML IJ SOLN: 25.0000 ug | INTRAMUSCULAR | Status: DC | PRN

## 2022-11-25 MED ORDER — ROCURONIUM BROMIDE 10 MG/ML (PF) SYRINGE
PREFILLED_SYRINGE | INTRAVENOUS | Status: AC
  Filled 2022-11-25: qty 10

## 2022-11-25 MED ORDER — HEPARIN SODIUM (PORCINE) 5000 UNIT/ML IJ SOLN: 5000.0000 [IU] | Freq: Three times a day (TID) | INTRAMUSCULAR | Status: DC

## 2022-11-25 MED ORDER — ONDANSETRON HCL 4 MG/2ML IJ SOLN
INTRAMUSCULAR | Status: DC | PRN
  Administered 2022-11-25: 4 mg via INTRAVENOUS

## 2022-11-25 MED ORDER — LIDOCAINE 2% (20 MG/ML) 5 ML SYRINGE
INTRAMUSCULAR | Status: DC | PRN
  Administered 2022-11-25: 60 mg via INTRAVENOUS

## 2022-11-25 MED ORDER — ACETAMINOPHEN 325 MG PO TABS
650.0000 mg | ORAL_TABLET | Freq: Four times a day (QID) | ORAL | Status: DC | PRN
  Administered 2022-11-25: 650 mg via ORAL
  Filled 2022-11-25: qty 2

## 2022-11-25 MED ORDER — ROSUVASTATIN CALCIUM 20 MG PO TABS
20.0000 mg | ORAL_TABLET | Freq: Every day | ORAL | Status: DC
  Administered 2022-11-25 – 2022-11-27 (×3): 20 mg via ORAL
  Filled 2022-11-25 (×3): qty 1

## 2022-11-25 MED ORDER — SODIUM CHLORIDE 0.9 % IR SOLN
Status: DC | PRN
  Administered 2022-11-25: 200 mL

## 2022-11-25 MED ORDER — TRAMADOL HCL 50 MG PO TABS
50.0000 mg | ORAL_TABLET | Freq: Four times a day (QID) | ORAL | Status: DC | PRN
  Administered 2022-11-25 – 2022-11-28 (×9): 50 mg via ORAL
  Filled 2022-11-25 (×9): qty 1

## 2022-11-25 MED ORDER — GABAPENTIN 300 MG PO CAPS
900.0000 mg | ORAL_CAPSULE | Freq: Three times a day (TID) | ORAL | Status: DC
  Administered 2022-11-25 – 2022-11-28 (×10): 900 mg via ORAL
  Filled 2022-11-25 (×10): qty 3

## 2022-11-25 MED ORDER — DEXAMETHASONE SODIUM PHOSPHATE 10 MG/ML IJ SOLN
INTRAMUSCULAR | Status: DC | PRN
  Administered 2022-11-25: 10 mg via INTRAVENOUS

## 2022-11-25 MED ORDER — DEXAMETHASONE SODIUM PHOSPHATE 10 MG/ML IJ SOLN
INTRAMUSCULAR | Status: AC
  Filled 2022-11-25: qty 2

## 2022-11-25 MED ORDER — UMECLIDINIUM BROMIDE 62.5 MCG/ACT IN AEPB
1.0000 | INHALATION_SPRAY | Freq: Every day | RESPIRATORY_TRACT | Status: DC
  Administered 2022-11-25 – 2022-11-28 (×4): 1 via RESPIRATORY_TRACT
  Filled 2022-11-25: qty 7

## 2022-11-25 MED ORDER — ALBUTEROL SULFATE HFA 108 (90 BASE) MCG/ACT IN AERS
INHALATION_SPRAY | RESPIRATORY_TRACT | Status: AC
  Filled 2022-11-25: qty 6.7

## 2022-11-25 MED ORDER — ACETAMINOPHEN 650 MG RE SUPP: 650.0000 mg | Freq: Four times a day (QID) | RECTAL | Status: DC | PRN

## 2022-11-25 NOTE — Anesthesia Preprocedure Evaluation (Signed)
Anesthesia Evaluation  Patient identified by MRN, date of birth, ID band Patient awake    Reviewed: Allergy & Precautions, H&P , NPO status , Patient's Chart, lab work & pertinent test results  Airway Mallampati: II  TM Distance: >3 FB Neck ROM: Full    Dental no notable dental hx.    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 with Right lateral wall and BI 50% obst  Acute resp failure   Pulmonary exam normal breath sounds clear to auscultation       Cardiovascular negative cardio ROS Normal cardiovascular exam Rhythm:Regular Rate:Normal     Neuro/Psych  Neuromuscular disease (peripheral neuropathy 2nry to DM)  negative psych ROS   GI/Hepatic negative GI ROS, Neg liver ROS, PUD,,,Elevated LFTs   Endo/Other  diabetes    Renal/GU negative Renal ROS     Musculoskeletal negative musculoskeletal ROS (+)    Abdominal   Peds  Hematology  (+) Blood dyscrasia, anemia   Anesthesia Other Findings   Reproductive/Obstetrics negative OB ROS                             Anesthesia Physical Anesthesia Plan  ASA: 4  Anesthesia Plan: General   Post-op Pain Management:    Induction: Intravenous  PONV Risk Score and Plan: TIVA and Propofol infusion  Airway Management Planned: Simple Face Mask  Additional Equipment:   Intra-op Plan:   Post-operative Plan:   Informed Consent: I have reviewed the patients History and Physical, chart, labs and discussed the procedure including the risks, benefits and alternatives for the proposed anesthesia with the patient or authorized representative who has indicated his/her understanding and acceptance.       Plan Discussed with:   Anesthesia Plan Comments:         Anesthesia Quick Evaluation

## 2022-11-25 NOTE — Progress Notes (Signed)
Patient back to floor after Bronchoscopy , he is in stable condition .     11/25/22 1445  Assess: MEWS Score  Temp 97.8 F (36.6 C)  BP 117/78  MAP (mmHg) 89  Pulse Rate (!) 116  ECG Heart Rate (!) 115  Resp 20  Level of Consciousness Alert  SpO2 94 %  O2 Device Nasal Cannula  O2 Flow Rate (L/min) 4 L/min  Assess: MEWS Score  MEWS Temp 0  MEWS Systolic 0  MEWS Pulse 2  MEWS RR 0  MEWS LOC 0  MEWS Score 2  MEWS Score Color Yellow  Assess: SIRS CRITERIA  SIRS Temperature  0  SIRS Pulse 1  SIRS Respirations  0  SIRS WBC 0  SIRS Score Sum  1

## 2022-11-25 NOTE — Progress Notes (Signed)
PROGRESS NOTE    Patient: Daryl Vega                            PCP: Nelly Laurence, NP                    DOB: 10-14-1946            DOA: 11/24/2022 OVF:[id number]             DOS: 11/25/2022, 12:06 PM   LOS: 0 days   Date of Service: The patient was seen and examined on 11/25/2022  Subjective:   The patient was seen and examined this morning. Stable at this time. Still complaining shortness of breath, on 4 L of oxygen, satting 94% stating he is not O2 dependent at home...  Otherwise no issues overnight .  Brief Narrative:   Daryl Vega is a 76 y.o. male with medical history significant for COPD, diabetes mellitus, pulmonary embolism, lung cancer. Patient presented to the ED with complaints of persistent difficulty breathing over the past month.  He denies worsening cough.  Reports he was diagnosed with pneumonia about 2 months ago, completed a course of Augmentin and Z-Pak subsequently Levaquin due to persistent of symptoms.  He reports gradual worsening of his breathing mostly with activity which improves when he stops to catch his breath.  No fevers no chills.  No chest pain.  Reports some mild lower extremity swelling just around his ankles.  He reports he no longer takes Eliquis.  He is not on home O2.   ED Course:  O2 sats dropped to 85% on room air with activity.  At rest sats greater than 96% on room air.  Heart rate 95-134.  Respirate rate 18-28.  Dimer 1.16.  BNP 312.  Lactic acid 2.1 > 1.3. X-ray showing chronic posttreatment changes within the right hemithorax.   Subsequent CTA chest-progressive postradiation changes with increased volume loss in the right apex, some increase in soft tissue density surrounding the right mainstem bronchus suspicious for soft tissue ingrowth into the trachea through the tracheal wall.  New from prior exam.  May represent progressive neoplasm. 500 mill bolus, and then 20 mg of Lasix given hospitalist called to admit for  acute hypoxic respiratory failure.     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      Assessment and Plan: * Acute respiratory failure with hypoxia (HCC) -In acute respiratory distress with shortness of breath, on admission was satting 85% on room air -Not O2 dependent at home -Currently satting 94% on 4 L of oxygen -Mildly elevated BNP 312, no signs of significant volume overload or heart failure  -CTA chest- Subsequent CTA chest-progressive postradiation changes with increased volume loss in the right apex, some increase in soft tissue density surrounding the right mainstem bronchus suspicious for soft tissue ingrowth into the trachea through the tracheal wall.  New from prior exam.  May represent progressive neoplasm. -Possible mild component of COPD exacerbation, steroids and DuoNebs for now -The case was discussed on admission with PCCM on-call at [facility], Dr Carlis Abbott,   -Discussed with pulmonologist/PCCM Dr. Elsworth Soho  Planning for bronchoscopy today, possible tissue sample from the  [place] trachea  -N.p.o. today  -Appreciate further recommendation per Dr. Elsworth Soho   Type 2 diabetes, controlled, 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[place]) - Hgba1c 8.1 - Hold glipizide and metformin -Anticipating hyperglycemia due to steroids -Will check CBG q. ACHS, SSI coverage   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 and BI 50% obst  Reports he has been on remission for 7 years, status post radiation and chemotherapy.  Lives with pulmonology- Dr. Michela Pitcher and oncology with [facility] health.  COPD mixed type ([place]) -Continue to have shortness of breath, -Requiring 4 L of oxygen to maintain O2 sat of 94% - Faint wheezing.  Stable unchanged/chronic mild cough.  Completed 2  courses of antibiotics in the past 2 months for pneumonia. -DuoNebs for now -Continue with IV steroids -Hold off on antibiotics for now (afebrile, no leukocytosis)     ----------------------------------------------------------------------------------------------------------------------------------------------- Nutritional status:  The patient's BMI is: Body mass index is 24.94 kg/m. I agree with the assessment and plan as outlined -------------------------------------------------------------------------------------------------------------------------------------  DVT prophylaxis:  heparin injection 5,000 Units Start: 11/25/22 0600   Code Status:   Code Status: Full Code  Family Communication: No family member present at bedside- attempt will be made to update daily The above findings and plan of care has been discussed with patient (and family)  in detail,  they expressed understanding and agreement of above. -Advance care planning has been discussed.   Admission status:   Status is: Observation The patient remains OBS appropriate and will d/c before 2 midnights.     Procedures:   No admission procedures for hospital encounter.   Antimicrobials:  Anti-infectives (From admission, onward)    None        Medication:   fluticasone furoate-vilanterol  1 puff Inhalation Daily   And   umeclidinium bromide  1 puff Inhalation Daily   gabapentin  900 mg Oral TID   heparin  5,000 Units Subcutaneous Q8H   [START ON 11/26/2022] influenza vaccine adjuvanted  0.5 mL Intramuscular Tomorrow-1000   insulin aspart  0-15 Units Subcutaneous TID WC   insulin aspart  0-5 Units Subcutaneous QHS   ipratropium-albuterol  3 mL Nebulization Q8H   methylPREDNISolone (SOLU-MEDROL) injection  60 mg Intravenous Q12H   [START ON 11/26/2022] pneumococcal 20-valent conjugate vaccine  0.5 mL Intramuscular Tomorrow-1000   rosuvastatin  20 mg Oral QHS    acetaminophen **OR** acetaminophen,  ipratropium-albuterol, ondansetron **OR** ondansetron (ZOFRAN) IV, polyethylene glycol, traMADol   Objective:   Vitals:   11/25/22 0600 11/25/22 0800 11/25/22 0902 11/25/22 0914  BP: 125/83 132/72 126/89   Pulse: (!) 105 (!) 102 (!) 114 (!) 101  Resp: (!) 21 (!) 22 (!) 22 (!) 22  Temp:  97.8 F (36.6 C)    TempSrc:  Oral    SpO2: 93% 93% 98% 94%  Weight:      Height:        Intake/Output Summary (Last 24 hours) at 11/25/2022 1206 Last data filed at 11/25/2022 0014 Gross per 24 hour  Intake 500 ml  Output --  Net 500 ml   Filed Weights   11/24/22 1548  Weight: 74.4 kg     Examination:   Physical Exam  Constitution:  Alert, cooperative, mild distress with shortness of breath, appears calm and comfortable  Psychiatric:   Normal and stable mood and affect, cognition intact,   HEENT:        Normocephalic, PERRL, otherwise with in Normal limits  Chest:  Chest symmetric Cardio vascular:  S1/S2, RRR, No murmure, No Rubs or Gallops  pulmonary: Clear to auscultation bilaterally, respirations unlabored, negative wheezes / crackles Abdomen: Soft, non-tender, non-distended, bowel sounds,no masses, no organomegaly Muscular skeletal: Limited exam - in bed, able to move all 4 extremities,   Neuro: CNII-XII intact. , normal motor and sensation, reflexes intact  Extremities: No pitting edema lower extremities, +2 pulses  Skin: Dry, warm to touch, negative for any Rashes, No open wounds Wounds: per nursing documentation   ------------------------------------------------------------------------------------------------------------------------------------------    LABs:     Latest Ref Rng & Units 11/25/2022    4:39 AM 11/24/2022    4:46 PM 05/04/2020    4:49 AM  CBC  WBC 4.0 - 10.5 K/uL 7.7  6.9  9.1   Hemoglobin 13.0 - 17.0 g/dL 13.6  13.1  11.6   Hematocrit 39.0 - 52.0 % 43.5  40.8  36.6   Platelets 150 - 400 K/uL 198  216  149       Latest Ref Rng & Units 11/25/2022     4:39 AM 11/24/2022    4:46 PM 05/04/2020    4:49 AM  CMP  Glucose 70 - 99 mg/dL 156  167  194   BUN 8 - 23 mg/dL 14  16  16    Creatinine 0.61 - 1.24 mg/dL 0.87  0.97  1.04   Sodium 135 - 145 mmol/L 140  140  135   Potassium 3.5 - 5.1 mmol/L 4.6  3.4  3.7   Chloride 98 - 111 mmol/L 109  106  106   CO2 22 - 32 mmol/L 23  23  23    Calcium 8.9 - 10.3 mg/dL 8.6  8.8  8.3   Total Protein 6.5 - 8.1 g/dL  8.6  6.9   Total Bilirubin 0.3 - 1.2 mg/dL  0.3  0.7   Alkaline Phos 38 - 126 U/L  110  67   AST 15 - 41 U/L  38  17   ALT 0 - 44 U/L  51  19        Micro Results Recent Results (from the past 240 hour(s))  Culture, blood (Routine x 2)     Status: None (Preliminary result)   Collection Time: 11/24/22  4:46 PM   Specimen: BLOOD RIGHT FOREARM  Result Value Ref Range Status   Specimen Description BLOOD RIGHT FOREARM  Final   Special Requests   Final    BOTTLES DRAWN AEROBIC AND ANAEROBIC Blood Culture results may not be optimal due to an excessive volume of blood received in culture bottles   Culture   Final    NO GROWTH < 24 HOURS Performed at [facility], [address] [place], [place] [address]    Report Status PENDING  Incomplete  Culture, blood (Routine x 2)     Status: None (Preliminary result)   Collection Time: 11/24/22  4:46 PM   Specimen: Left Antecubital; Blood  Result Value Ref Range Status   Specimen Description LEFT ANTECUBITAL  Final   Special Requests   Final    BOTTLES DRAWN AEROBIC AND ANAEROBIC Blood Culture adequate volume   Culture   Final    NO GROWTH < 24 HOURS Performed at [facility], [address] [place], [place] [address]    Report Status PENDING  Incomplete    Radiology Reports CT Angio Chest PE W and/or Wo Contrast  Result Date: 11/24/2022 CLINICAL DATA:  Positive D-dimer and difficulty breathing EXAM: CT ANGIOGRAPHY  CHEST WITH CONTRAST TECHNIQUE: Multidetector CT imaging of the chest was performed using the standard protocol during  bolus administration of intravenous contrast. Multiplanar CT image reconstructions and MIPs were obtained to evaluate the vascular anatomy. RADIATION DOSE REDUCTION: This exam was performed according to the departmental dose-optimization program which includes automated exposure control, adjustment of the mA and/or kV according to patient size and/or use of iterative reconstruction technique. CONTRAST:  65mL OMNIPAQUE IOHEXOL 350 MG/ML SOLN COMPARISON:  05/02/2020 CT, chest x-ray from earlier in the same day. FINDINGS: Cardiovascular: Atherosclerotic calcifications are noted. No aneurysmal dilatation or dissection is seen. No cardiac enlargement is noted. Heavy coronary calcifications are seen. The pulmonary artery is well visualized bilaterally. No filling defects to suggest pulmonary embolism are noted. Mediastinum/Nodes: Thoracic inlet is within normal limits. Mediastinal shift to the right is noted secondary to volume loss related to the prior treatment. Left hilar lymph node is noted measuring up to 1.2 cm. A few smaller nodes are noted as well. No significant subcarinal adenopathy is seen. Soft tissue density is noted along the right paratracheal space which appears to extend through the trachea. This is best seen on image number 102 of series 5. Generalized increased soft tissue density around the right mainstem bronchus is seen. The possibility of recurrent neoplasm could not be totally excluded. Lungs/Pleura: Volume loss is noted in the right upper lobe consistent with the prior therapy. Persistent and slightly increased consolidation in the right apex is noted. Scarring is noted in the lingula and left lower lobe posteriorly. No new focal infiltrate is seen. No parenchymal nodules are noted. Emphysematous changes seen. Chronic scarring in the right base is noted as well as a small pleural effusion slightly increased from the prior study. Upper Abdomen: Stable right adrenal lesion is noted measuring up to  2.2 cm. This demonstrates Hounsfield unit of -16 and is consistent with a stable adenoma. The remainder of the upper abdomen is within normal limits. Musculoskeletal: Degenerative changes of the thoracic spine are seen. No acute rib abnormality is noted. Review of the MIP images confirms the above findings. IMPRESSION: Progressive post radiation changes with increased volume loss in the right apex. Some increase in soft tissue density surrounding the right mainstem bronchus is noted with findings suspicious for soft tissue ingrowth into the trachea through the tracheal wall. These changes are new from the prior exam. Further workup is recommended as this could represent progressive neoplasm. No evidence of pulmonary emboli. 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 Aortic Atherosclerosis (ICD10-I70.0). Electronically Signed   By: Inez Catalina M.D.   On: 11/24/2022 19:23   DG Chest Port 1 View  Result Date: 11/24/2022 CLINICAL DATA:  Sepsis EXAM: PORTABLE CHEST 1 VIEW COMPARISON:  05/02/2020 FINDINGS: Grossly stable cardiomediastinal contours with significant volume loss in the right hemithorax. Left lung is clear. Probable small right pleural effusion. No pneumothorax. IMPRESSION: Chronic post treatment changes within the right hemithorax. No evidence of a superimposed acute cardiopulmonary process. Electronically Signed   By: Davina Poke D.O.   On: 11/24/2022 16:36    SIGNED: Deatra James, MD, FHM. [facility],  Pager (please use [email or web address] to page/text) Please use Epic Secure Chat for non-urgent communication (7AM-7PM)  If 7PM-7AM, please contact night-coverage [email or web address], 11/25/2022, 12:06 PM

## 2022-11-25 NOTE — H&P (View-Only) (Signed)
NAME:  Daryl Vega, MRN:  [id number], DOB:  05/28/1946, LOS: 0 ADMISSION DATE:  11/24/2022, CONSULTATION DATE:  11/25/2022  REFERRING MD:  shahmehdi, [facility], CHIEF COMPLAINT: Shortness of breath  History of Present Illness:  76 year old ex-smoker with history of COPD and lung cancer presents with shortness of breath for about 1 month.  He was found to be hypoxic to 85% and placed on 2 L nasal cannula He was treated as an outpatient about 2 months ago for pneumonia initially with Augmentin and then with Levaquin. CT angiogram chest showed radiation changes at the right apex with soft tissue density around the right mainstem bronchus and invading into carina through tracheal wall.  This was new compared to his last CT from 04/2020.  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 Randa Ngo and chemotherapy  -Area of right upper lobe consolidation evaluated by PET/CT 05/2020, no hypermetabolism  -Pulmonary embolism, discontinued apixaban 2018 -Diabetes type 2 -Chemotherapy-induced neuropathy  Significant Hospital Events: Including procedures, antibiotic start and stop dates in addition to other pertinent events     Interim History / Subjective:  Breathing slightly improved, denies hemoptysis  Objective   Blood pressure 135/82, pulse (!) 113, temperature 97.7 F (36.5 C), temperature source Oral, resp. rate (!) 21, height 5\' 8"  (1.727 m), weight 74.4 kg, SpO2 92 %.        Intake/Output Summary (Last 24 hours) at 11/25/2022 1318 Last data filed at 11/25/2022 0014 Gross per 24 hour  Intake 500 ml  Output --  Net 500 ml   Filed Weights   11/24/22  1548 11/25/22 1223  Weight: 74.4 kg 74.4 kg    Examination: General: Adult man sitting up in ED stretcher, no distress HENT: Mild pallor, no icterus, no JVD Lungs: Decreased breath sounds bilateral, no accessory muscle use Cardiovascular: S1-S2 regular, no murmur Abdomen: Soft, nontender, no organomegaly Extremities: No edema, no deformity Neuro: Alert oriented x 3, nonfocal Labs show normal electrolytes, no leukocytosis. Lactate of 2.1 improved to 1.3. CT chest independently reviewed  Resolved Hospital Problem list     Assessment & Plan:  Concern for recurrence of lung malignancy invading right mainstem bronchus and carina, status post radiation to right upper lobe with radiation changes and chemotherapy in 2015 when he presented with stage IIIb squamous cell lung cancer  Lung cancer -discussed about possibility with patient, he is willing to proceed with bronchoscopy under general anesthesia this was scheduled for today, he has been n.p.o. since morning -Risks and benefits of the procedure discussed with patient in great detail. -If this does turn out to be malignancy, he will likely need radiation therapy to mediastinum  COPD -no evidence of exacerbation Can use budesonide/Brovana/Yupelri combination while hospitalized and resume Trelegy as outpatient  Best Practice (right click and "Reselect all SmartList Selections" daily)   Per [facility]  Labs   CBC: Recent Labs  Lab 11/24/22 1646 11/25/22 0439  WBC 6.9 7.7  NEUTROABS 4.7  --   HGB 13.1 13.6  HCT 40.8 43.5  MCV 84.1 85.8  PLT 216 694    Basic Metabolic Panel: Recent Labs  Lab 11/24/22 1646 11/25/22 0439  NA 140 140  K  3.4* 4.6  CL 106 109  CO2 23 23  GLUCOSE 167* 156*  BUN 16 14  CREATININE 0.97 0.87  CALCIUM 8.8* 8.6*   GFR: Estimated Creatinine Clearance: 71 mL/min (by C-G formula based on SCr of 0.87 mg/dL). Recent Labs  Lab 11/24/22 1646 11/24/22 1926 11/25/22 0439  WBC 6.9  --  7.7   LATICACIDVEN 2.1* 1.3  --     Liver Function Tests: Recent Labs  Lab 11/24/22 1646  AST 38  ALT 51*  ALKPHOS 110  BILITOT 0.3  PROT 8.6*  ALBUMIN 3.4*   No results for input(s): "LIPASE", "AMYLASE" in the last 168 hours. No results for input(s): "AMMONIA" in the last 168 hours.  ABG    Component Value Date/Time   PHART 7.341 (L) 12/01/2014 0910   PCO2ART 27.6 (L) 12/01/2014 0910   PO2ART 128.0 (H) 12/01/2014 0910   HCO3 14.5 (L) 12/01/2014 0910   TCO2 29 05/02/2020 1857   ACIDBASEDEF 9.7 (H) 12/01/2014 0910   O2SAT 99.0 12/01/2014 0910     Coagulation Profile: Recent Labs  Lab 11/24/22 1646  INR 1.0    Cardiac Enzymes: No results for input(s): "CKTOTAL", "CKMB", "CKMBINDEX", "TROPONINI" in the last 168 hours.  HbA1C: Hgb A1c MFr Bld  Date/Time Value Ref Range Status  05/03/2020 09:12 AM 8.3 (H) 4.8 - 5.6 % Final    Comment:    (NOTE) Pre diabetes:          5.7%-6.4% Diabetes:              >6.4% Glycemic control for   <7.0% adults with diabetes   05/02/2020 10:17 PM 8.4 (H) 4.8 - 5.6 % Final    Comment:    (NOTE)         Prediabetes: 5.7 - 6.4         Diabetes: >6.4         Glycemic control for adults with diabetes: <7.0     CBG: Recent Labs  Lab 11/25/22 0123 11/25/22 0907 11/25/22 1114  GLUCAP 88 242* 255*    Review of Systems:   Shortness of breath  Constitutional: negative for anorexia, fevers and sweats  Eyes: negative for irritation, redness and visual disturbance  Ears, nose, mouth, throat, and face: negative for earaches, epistaxis, nasal congestion and sore throat  Cardiovascular: negative for chest pain, dyspnea, lower extremity edema, orthopnea, palpitations and syncope  Gastrointestinal: negative for abdominal pain, constipation, diarrhea, melena, nausea and vomiting  Genitourinary:negative for dysuria, frequency and hematuria  Hematologic/lymphatic: negative for bleeding, easy bruising and lymphadenopathy   Musculoskeletal:negative for arthralgias, muscle weakness and stiff joints  Neurological: negative for coordination problems, gait problems, headaches and weakness  Endocrine: negative for diabetic symptoms including polydipsia, polyuria and weight loss   Past Medical History:  He,  has a past medical history of Anemia, C. difficile enteritis, Cholecystoduodenal fistula, COPD (chronic obstructive pulmonary disease) ([place]), DM (diabetes mellitus) ([place]), Duodenal ulcer, Elevated LFTs, FH: chemotherapy, Gastroparesis (02/09/2015), Hypercholesteremia, Lung cancer ([place]), Melanoma ([place]), Pneumonia, Protein calorie malnutrition ([place]), Pulmonary embolus ([place]), and Radiation (08/03/14-08/23/14).   Surgical History:   Past Surgical History:  Procedure Laterality Date   CHOLECYSTECTOMY     duodenostomy tube     ESOPHAGOGASTRODUODENOSCOPY N/A 11/30/2014   Procedure: ESOPHAGOGASTRODUODENOSCOPY (EGD);  Surgeon: [name], MD;  Location: [facility];  Service: Endoscopy;  Laterality: N/A;   JEJUNOSTOMY FEEDING TUBE     LAPAROTOMY N/A 11/30/2014   Procedure: EXPLORATORY LAPAROTOMY, PYLOROPLASTY, OVERSEWING OF POSTERIOR DUODENUM, DUODENOSTOMY,  CHOLECYSTECTOMY, JEJEUNOSTOMY;  Surgeon: Jackolyn Confer, MD;  Location: [facility];  Service: General;  Laterality: N/A;   PYLOROPLASTY     VIDEO BRONCHOSCOPY Bilateral 07/20/2014   Procedure: VIDEO BRONCHOSCOPY WITHOUT FLUORO;  Surgeon: Tanda Rockers, MD;  Location: [facility];  Service: Cardiopulmonary;  Laterality: Bilateral;     Social History:   reports that he quit smoking about 8 years ago. His smoking use included cigarettes and cigars. He has a 100.00 pack-year smoking history. He quit smokeless tobacco use about 10 years ago.  His smokeless tobacco use included chew. He reports that he does not drink alcohol and does not use drugs.   Family History:  His family history includes Cancer in his mother; Heart disease in his mother.   Allergies No Known  Allergies   Home Medications  Prior to Admission medications   Medication Sig Start Date End Date Taking? Authorizing Provider  acetaminophen (TYLENOL) 325 MG tablet Take 2 tablets (650 mg total) by mouth every 6 (six) hours as needed for mild pain (or Fever >/= 101). 10/01/15  Yes Robbie Lis, MD  gabapentin (NEURONTIN) 300 MG capsule Take 900 mg by mouth 3 (three) times daily.  03/30/20  Yes [provider]  glipiZIDE (GLUCOTROL XL) 10 MG 24 hr tablet Take 10 mg by mouth daily. 04/04/20  Yes [provider]  metFORMIN (GLUCOPHAGE) 500 MG tablet Take 500 mg by mouth in the morning, at noon, and at bedtime.  01/09/20  Yes [provider]  mirtazapine (REMERON) 7.5 MG tablet Take by mouth. 11/18/22  Yes [provider]  rosuvastatin (CRESTOR) 20 MG tablet Take 20 mg by mouth at bedtime. 04/04/20  Yes [provider]  traMADol (ULTRAM) 50 MG tablet Take 50 mg by mouth every 6 (six) hours as needed for moderate pain or severe pain.  04/21/20  Yes [provider]  TRELEGY ELLIPTA 100-62.5-25 MCG/ACT AEPB Inhale 1 puff into the lungs daily. 11/10/22  Yes [provider]  albuterol (VENTOLIN HFA) 108 (90 Base) MCG/ACT inhaler Inhale 2 puffs into the lungs every 4 (four) hours as needed.    [provider]  bismuth subsalicylate (PEPTO BISMOL) 262 MG/15ML suspension Place 30 mLs into feeding tube every 4 (four) hours as needed for indigestion. Patient not taking: Reported on 05/02/2020 01/11/15   Rama, Venetia Maxon, MD  clotrimazole-betamethasone (LOTRISONE) cream Apply 1 application topically 2 (two) times daily as needed (dry skin).  12/27/19   [provider]  glimepiride (AMARYL) 4 MG tablet Take by mouth.    [provider]  temazepam (RESTORIL) 15 MG capsule Take by mouth.    [provider]      Kara Mead MD. FCCP. [facility] Pager : [phone]  If no response to pager , please  call [phone] until 7 pm After 7:00 pm call Elink  [phone]   11/25/2022

## 2022-11-25 NOTE — Hospital Course (Addendum)
Daryl Vega is a 76 y.o. male with medical history significant for COPD, diabetes mellitus, pulmonary embolism, lung cancer. Patient presented to the ED with complaints of persistent difficulty breathing over the past month.  He denies worsening cough.  Reports he was diagnosed with pneumonia about 2 months ago, completed a course of Augmentin and Z-Pak subsequently Levaquin due to persistent of symptoms.  He reports gradual worsening of his breathing mostly with activity which improves when he stops to catch his breath.  No fevers no chills.  No chest pain.  Reports some mild lower extremity swelling just around his ankles.  He reports he no longer takes Eliquis.  He is not on home O2.   ED Course:  O2 sats dropped to 85% on room air with activity.  At rest sats greater than 96% on room air.  Heart rate 95-134.  Respirate rate 18-28.  Dimer 1.16.  BNP 312.  Lactic acid 2.1 > 1.3. X-ray showing chronic posttreatment changes within the right hemithorax.   Subsequent CTA chest-progressive postradiation changes with increased volume loss in the right apex, some increase in soft tissue density surrounding the right mainstem bronchus suspicious for soft tissue ingrowth into the trachea through the tracheal wall.  New from prior exam.  May represent progressive neoplasm. 500 mill bolus, and then 20 mg of Lasix given hospitalist called to admit for acute hypoxic respiratory failure.

## 2022-11-25 NOTE — Anesthesia Procedure Notes (Signed)
Procedure Name: Intubation Date/Time: 11/25/2022 1:53 PM  Performed by: Myna Bright, CRNAPre-anesthesia Checklist: Patient identified, Emergency Drugs available, Suction available and Patient being monitored Patient Re-evaluated:Patient Re-evaluated prior to induction Oxygen Delivery Method: Circle system utilized Preoxygenation: Pre-oxygenation with 100% oxygen Induction Type: IV induction Ventilation: Mask ventilation without difficulty Laryngoscope Size: Mac and 4 Grade View: Grade I Tube type: Oral Tube size: 7.5 mm Number of attempts: 1 Airway Equipment and Method: Stylet Placement Confirmation: ETT inserted through vocal cords under direct vision, positive ETCO2 and breath sounds checked- equal and bilateral Secured at: 22 cm Tube secured with: Tape Dental Injury: Teeth and Oropharynx as per pre-operative assessment

## 2022-11-25 NOTE — TOC Progression Note (Signed)
  Transition of Care [facility]) Screening Note   Patient Details  Name: Daryl Vega Date of Birth: 03-03-1946   Transition of Care [facility]) CM/SW Contact:    Boneta Lucks, RN Phone Number: 11/25/2022, 10:53 AM  Bronch today, NPO, on 3L - may go to [facility], TOC following.   Transition of Care Department [facility]) has reviewed patient and no TOC needs have been identified at this time. We will continue to monitor patient advancement through interdisciplinary progression rounds. If new patient transition needs arise, please place a TOC consult.   Barriers to Discharge: Continued Medical Work up  Expected Discharge Plan and Services      Living arrangements for the past 2 months: [place]

## 2022-11-25 NOTE — Consult Note (Signed)
NAME:  Daryl Vega, MRN:  [id number], DOB:  03-16-1946, LOS: 0 ADMISSION DATE:  11/24/2022, CONSULTATION DATE:  11/25/2022  REFERRING MD:  shahmehdi, TRH, CHIEF COMPLAINT: Shortness of breath  History of Present Illness:  76 year old ex-smoker with history of COPD and lung cancer presents with shortness of breath for about 1 month.  He was found to be hypoxic to 85% and placed on 2 L nasal cannula He was treated as an outpatient about 2 months ago for pneumonia initially with Augmentin and then with Levaquin. CT angiogram chest showed radiation changes at the right apex with soft tissue density around the right mainstem bronchus and invading into carina through tracheal wall.  This was new compared to his last CT from 04/2020.  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 Randa Ngo and chemotherapy  -Area of right upper lobe consolidation evaluated by PET/CT 05/2020, no hypermetabolism  -Pulmonary embolism, discontinued apixaban 2018 -Diabetes type 2 -Chemotherapy-induced neuropathy  Significant Hospital Events: Including procedures, antibiotic start and stop dates in addition to other pertinent events     Interim History / Subjective:  Breathing slightly improved, denies hemoptysis  Objective   Blood pressure 135/82, pulse (!) 113, temperature 97.7 F (36.5 C), temperature source Oral, resp. rate (!) 21, height 5\' 8"  (1.727 m), weight 74.4 kg, SpO2 92 %.        Intake/Output Summary (Last 24 hours) at 11/25/2022 1318 Last data filed at 11/25/2022 0014 Gross per 24 hour  Intake 500 ml  Output --  Net 500 ml   Filed Weights   11/24/22  1548 11/25/22 1223  Weight: 74.4 kg 74.4 kg    Examination: General: Adult man sitting up in ED stretcher, no distress HENT: Mild pallor, no icterus, no JVD Lungs: Decreased breath sounds bilateral, no accessory muscle use Cardiovascular: S1-S2 regular, no murmur Abdomen: Soft, nontender, no organomegaly Extremities: No edema, no deformity Neuro: Alert oriented x 3, nonfocal Labs show normal electrolytes, no leukocytosis. Lactate of 2.1 improved to 1.3. CT chest independently reviewed  Resolved Hospital Problem list     Assessment & Plan:  Concern for recurrence of lung malignancy invading right mainstem bronchus and carina, status post radiation to right upper lobe with radiation changes and chemotherapy in 2015 when he presented with stage IIIb squamous cell lung cancer  Lung cancer -discussed about possibility with patient, he is willing to proceed with bronchoscopy under general anesthesia this was scheduled for today, he has been n.p.o. since morning -Risks and benefits of the procedure discussed with patient in great detail. -If this does turn out to be malignancy, he will likely need radiation therapy to mediastinum  COPD -no evidence of exacerbation Can use budesonide/Brovana/Yupelri combination while hospitalized and resume Trelegy as outpatient  Best Practice (right click and "Reselect all SmartList Selections" daily)   Per TRH  Labs   CBC: Recent Labs  Lab 11/24/22 1646 11/25/22 0439  WBC 6.9 7.7  NEUTROABS 4.7  --   HGB 13.1 13.6  HCT 40.8 43.5  MCV 84.1 85.8  PLT 216 086    Basic Metabolic Panel: Recent Labs  Lab 11/24/22 1646 11/25/22 0439  NA 140 140  K  3.4* 4.6  CL 106 109  CO2 23 23  GLUCOSE 167* 156*  BUN 16 14  CREATININE 0.97 0.87  CALCIUM 8.8* 8.6*   GFR: Estimated Creatinine Clearance: 71 mL/min (by C-G formula based on SCr of 0.87 mg/dL). Recent Labs  Lab 11/24/22 1646 11/24/22 1926 11/25/22 0439  WBC 6.9  --  7.7   LATICACIDVEN 2.1* 1.3  --     Liver Function Tests: Recent Labs  Lab 11/24/22 1646  AST 38  ALT 51*  ALKPHOS 110  BILITOT 0.3  PROT 8.6*  ALBUMIN 3.4*   No results for input(s): "LIPASE", "AMYLASE" in the last 168 hours. No results for input(s): "AMMONIA" in the last 168 hours.  ABG    Component Value Date/Time   PHART 7.341 (L) 12/01/2014 0910   PCO2ART 27.6 (L) 12/01/2014 0910   PO2ART 128.0 (H) 12/01/2014 0910   HCO3 14.5 (L) 12/01/2014 0910   TCO2 29 05/02/2020 1857   ACIDBASEDEF 9.7 (H) 12/01/2014 0910   O2SAT 99.0 12/01/2014 0910     Coagulation Profile: Recent Labs  Lab 11/24/22 1646  INR 1.0    Cardiac Enzymes: No results for input(s): "CKTOTAL", "CKMB", "CKMBINDEX", "TROPONINI" in the last 168 hours.  HbA1C: Hgb A1c MFr Bld  Date/Time Value Ref Range Status  05/03/2020 09:12 AM 8.3 (H) 4.8 - 5.6 % Final    Comment:    (NOTE) Pre diabetes:          5.7%-6.4% Diabetes:              >6.4% Glycemic control for   <7.0% adults with diabetes   05/02/2020 10:17 PM 8.4 (H) 4.8 - 5.6 % Final    Comment:    (NOTE)         Prediabetes: 5.7 - 6.4         Diabetes: >6.4         Glycemic control for adults with diabetes: <7.0     CBG: Recent Labs  Lab 11/25/22 0123 11/25/22 0907 11/25/22 1114  GLUCAP 88 242* 255*    Review of Systems:   Shortness of breath  Constitutional: negative for anorexia, fevers and sweats  Eyes: negative for irritation, redness and visual disturbance  Ears, nose, mouth, throat, and face: negative for earaches, epistaxis, nasal congestion and sore throat  Cardiovascular: negative for chest pain, dyspnea, lower extremity edema, orthopnea, palpitations and syncope  Gastrointestinal: negative for abdominal pain, constipation, diarrhea, melena, nausea and vomiting  Genitourinary:negative for dysuria, frequency and hematuria  Hematologic/lymphatic: negative for bleeding, easy bruising and lymphadenopathy   Musculoskeletal:negative for arthralgias, muscle weakness and stiff joints  Neurological: negative for coordination problems, gait problems, headaches and weakness  Endocrine: negative for diabetic symptoms including polydipsia, polyuria and weight loss   Past Medical History:  He,  has a past medical history of Anemia, C. difficile enteritis, Cholecystoduodenal fistula, COPD (chronic obstructive pulmonary disease) ([place]), DM (diabetes mellitus) ([place]), Duodenal ulcer, Elevated LFTs, FH: chemotherapy, Gastroparesis (02/09/2015), Hypercholesteremia, Lung cancer ([place]), Melanoma ([place]), Pneumonia, Protein calorie malnutrition ([place]), Pulmonary embolus ([place]), and Radiation (08/03/14-08/23/14).   Surgical History:   Past Surgical History:  Procedure Laterality Date   CHOLECYSTECTOMY     duodenostomy tube     ESOPHAGOGASTRODUODENOSCOPY N/A 11/30/2014   Procedure: ESOPHAGOGASTRODUODENOSCOPY (EGD);  Surgeon: [name], MD;  Location: [facility];  Service: Endoscopy;  Laterality: N/A;   JEJUNOSTOMY FEEDING TUBE     LAPAROTOMY N/A 11/30/2014   Procedure: EXPLORATORY LAPAROTOMY, PYLOROPLASTY, OVERSEWING OF POSTERIOR DUODENUM, DUODENOSTOMY,  CHOLECYSTECTOMY, JEJEUNOSTOMY;  Surgeon: Jackolyn Confer, MD;  Location: [facility];  Service: General;  Laterality: N/A;   PYLOROPLASTY     VIDEO BRONCHOSCOPY Bilateral 07/20/2014   Procedure: VIDEO BRONCHOSCOPY WITHOUT FLUORO;  Surgeon: Tanda Rockers, MD;  Location: [facility];  Service: Cardiopulmonary;  Laterality: Bilateral;     Social History:   reports that he quit smoking about 8 years ago. His smoking use included cigarettes and cigars. He has a 100.00 pack-year smoking history. He quit smokeless tobacco use about 10 years ago.  His smokeless tobacco use included chew. He reports that he does not drink alcohol and does not use drugs.   Family History:  His family history includes Cancer in his mother; Heart disease in his mother.   Allergies No Known  Allergies   Home Medications  Prior to Admission medications   Medication Sig Start Date End Date Taking? Authorizing Provider  acetaminophen (TYLENOL) 325 MG tablet Take 2 tablets (650 mg total) by mouth every 6 (six) hours as needed for mild pain (or Fever >/= 101). 10/01/15  Yes Robbie Lis, MD  gabapentin (NEURONTIN) 300 MG capsule Take 900 mg by mouth 3 (three) times daily.  03/30/20  Yes [provider]  glipiZIDE (GLUCOTROL XL) 10 MG 24 hr tablet Take 10 mg by mouth daily. 04/04/20  Yes [provider]  metFORMIN (GLUCOPHAGE) 500 MG tablet Take 500 mg by mouth in the morning, at noon, and at bedtime.  01/09/20  Yes [provider]  mirtazapine (REMERON) 7.5 MG tablet Take by mouth. 11/18/22  Yes [provider]  rosuvastatin (CRESTOR) 20 MG tablet Take 20 mg by mouth at bedtime. 04/04/20  Yes [provider]  traMADol (ULTRAM) 50 MG tablet Take 50 mg by mouth every 6 (six) hours as needed for moderate pain or severe pain.  04/21/20  Yes [provider]  TRELEGY ELLIPTA 100-62.5-25 MCG/ACT AEPB Inhale 1 puff into the lungs daily. 11/10/22  Yes [provider]  albuterol (VENTOLIN HFA) 108 (90 Base) MCG/ACT inhaler Inhale 2 puffs into the lungs every 4 (four) hours as needed.    [provider]  bismuth subsalicylate (PEPTO BISMOL) 262 MG/15ML suspension Place 30 mLs into feeding tube every 4 (four) hours as needed for indigestion. Patient not taking: Reported on 05/02/2020 01/11/15   Rama, Venetia Maxon, MD  clotrimazole-betamethasone (LOTRISONE) cream Apply 1 application topically 2 (two) times daily as needed (dry skin).  12/27/19   [provider]  glimepiride (AMARYL) 4 MG tablet Take by mouth.    [provider]  temazepam (RESTORIL) 15 MG capsule Take by mouth.    [provider]      Kara Mead MD. FCCP. [facility] Pager : [phone]  If no response to pager , please  call [phone] until 7 pm After 7:00 pm call Elink  [phone]   11/25/2022

## 2022-11-25 NOTE — Interval H&P Note (Signed)
History and Physical Interval Note:  11/25/2022 1:29 PM  Daryl Vega Sorter  has presented today for surgery, with the diagnosis of lung mass.  The various methods of treatment have been discussed with the patient and family. After consideration of risks, benefits and other options for treatment, the patient has consented to  Procedure(s): FLEXIBLE BRONCHOSCOPY (Bilateral) as a surgical intervention.  The patient's history has been reviewed, patient examined, no change in status, stable for surgery.  I have reviewed the patient's chart and labs.  Questions were answered to the patient's satisfaction.     Leanna Sato [place]

## 2022-11-25 NOTE — Progress Notes (Signed)
Remains Yellow MEWS from ED. Pulse 114    11/25/22 0902  Assess: MEWS Score  BP 126/89  MAP (mmHg) 100  Pulse Rate (!) 114  Level of Consciousness Alert  SpO2 98 %  O2 Device Nasal Cannula  O2 Flow Rate (L/min) 3 L/min  Assess: MEWS Score  MEWS Temp 0  MEWS Systolic 0  MEWS Pulse 2  MEWS RR 1  MEWS LOC 0  MEWS Score 3  MEWS Score Color Yellow  Assess: SIRS CRITERIA  SIRS Temperature  0  SIRS Pulse 1  SIRS Respirations  1  SIRS WBC 0  SIRS Score Sum  2

## 2022-11-25 NOTE — Transfer of Care (Signed)
Immediate Anesthesia Transfer of Care Note  Patient: Daryl Vega  Procedure(s) Performed: FLEXIBLE BRONCHOSCOPY (Bilateral)  Patient Location: PACU  Anesthesia Type:General  Level of Consciousness: awake, alert , oriented, and patient cooperative  Airway & Oxygen Therapy: Patient Spontanous Breathing  Post-op Assessment: Report given to RN, Post -op Vital signs reviewed and stable, and Patient moving all extremities  Post vital signs: Reviewed and stable  Last Vitals:  Vitals Value Taken Time  BP 117/78 11/25/22 1445  Temp 36.1 C 11/25/22 1433  Pulse 115 11/25/22 1448  Resp 24 11/25/22 1448  SpO2 91 % 11/25/22 1448  Vitals shown include unvalidated device data.  Last Pain:  Vitals:   11/25/22 1433  TempSrc:   PainSc: 0-No pain         Complications: No notable events documented.

## 2022-11-25 NOTE — Op Note (Signed)
Indication : Tracheal and right upper lobe endobronchial mass on CT scan, suspected recurrence of squamous cell cancer Written informed consent was obtained prior to the procedure. The risks of the procedure including coughing, bleeding and the small chance of lung puncture requiring chest tube were discussed in great detail. The benefits & alternatives including serial follow up were also discussed.  General anesthesia was provided Bronchoscope entered from the ET tube Thick mucus visualized at carina which was suctioned off. Left sided airways appeared patent.Mild amount of white secretions were noted. No endobronchial lesions seen.  Right mainstem bronchus was narrowed immediately after takeoff, the narrowing could not be traversed.  No endobronchial lesion noted. Carina did not show mass, mucosa appeared slightly inflamed BAL obtained from right mainstem bronchus.  Brushings were obtained from right mainstem bronchus and from carina  Endobronchial ultrasound equipment was not available.  Specimen sent  -BAL for cytology and microbiology -Brushings from right mainstem and carina for cytology  Daryl Mcneece V.  [phone]

## 2022-11-25 NOTE — Progress Notes (Signed)
Remains yellow MEWS from ED   11/25/22 1223  Vitals  Temp 97.7 F (36.5 C)  Temp Source Oral  BP 135/82  Pulse Rate (!) 113  Pulse Rate Source Monitor  ECG Heart Rate (!) 113  Resp (!) 21  Level of Consciousness  Level of Consciousness Alert  MEWS COLOR  MEWS Score Color Yellow  Oxygen Therapy  SpO2 92 %  O2 Device Room Air  O2 Flow Rate (L/min) 4 L/min  Pain Assessment  Pain Scale 0-10  Pain Score 0  PCA/Epidural/Spinal Assessment  Respiratory Pattern Dyspnea with exertion  Height and Weight  Height 5\' 8"  (1.727 m)  Weight 74.4 kg  BSA (Calculated - sq m) 1.89 sq meters  BMI (Calculated) 24.94  Weight in (lb) to have BMI = 25 164.1  MEWS Score  MEWS Temp 0  MEWS Systolic 0  MEWS Pulse 2  MEWS RR 1  MEWS LOC 0  MEWS Score 3

## 2022-11-26 ENCOUNTER — Inpatient Hospital Stay (HOSPITAL_COMMUNITY): Payer: Medicare Other

## 2022-11-26 DIAGNOSIS — J9601 Acute respiratory failure with hypoxia: Secondary | ICD-10-CM | POA: Diagnosis not present

## 2022-11-26 LAB — GLUCOSE, CAPILLARY
Glucose-Capillary: 271 mg/dL — ABNORMAL HIGH (ref 70–99)
Glucose-Capillary: 361 mg/dL — ABNORMAL HIGH (ref 70–99)
Glucose-Capillary: 333 mg/dL — ABNORMAL HIGH (ref 70–99)
Glucose-Capillary: 415 mg/dL — ABNORMAL HIGH (ref 70–99)
Glucose-Capillary: 307 mg/dL — ABNORMAL HIGH (ref 70–99)

## 2022-11-26 LAB — GLUCOSE, RANDOM: Glucose, Bld: 385 mg/dL — ABNORMAL HIGH (ref 70–99)

## 2022-11-26 MED ORDER — IPRATROPIUM-ALBUTEROL 0.5-2.5 (3) MG/3ML IN SOLN
3.0000 mL | RESPIRATORY_TRACT | Status: DC
  Administered 2022-11-26 (×4): 3 mL via RESPIRATORY_TRACT
  Filled 2022-11-26 (×5): qty 3

## 2022-11-26 MED ORDER — GUAIFENESIN-DM 100-10 MG/5ML PO SYRP
10.0000 mL | ORAL_SOLUTION | Freq: Four times a day (QID) | ORAL | Status: DC
  Administered 2022-11-26 (×3): 10 mL via ORAL
  Filled 2022-11-26 (×5): qty 10

## 2022-11-26 MED ORDER — INSULIN ASPART 100 UNIT/ML IJ SOLN
0.0000 [IU] | Freq: Three times a day (TID) | INTRAMUSCULAR | Status: DC
  Administered 2022-11-26: 15 [IU] via SUBCUTANEOUS
  Administered 2022-11-26: 20 [IU] via SUBCUTANEOUS
  Administered 2022-11-27: 15 [IU] via SUBCUTANEOUS
  Administered 2022-11-27: 11 [IU] via SUBCUTANEOUS
  Administered 2022-11-27: 15 [IU] via SUBCUTANEOUS
  Administered 2022-11-28: 11 [IU] via SUBCUTANEOUS

## 2022-11-26 MED ORDER — IPRATROPIUM-ALBUTEROL 0.5-2.5 (3) MG/3ML IN SOLN: 3.0000 mL | RESPIRATORY_TRACT | Status: DC

## 2022-11-26 MED ORDER — INSULIN ASPART 100 UNIT/ML IJ SOLN
0.0000 [IU] | Freq: Every day | INTRAMUSCULAR | Status: DC
  Administered 2022-11-26: 5 [IU] via SUBCUTANEOUS
  Administered 2022-11-26: 4 [IU] via SUBCUTANEOUS
  Administered 2022-11-27: 5 [IU] via SUBCUTANEOUS

## 2022-11-26 MED ORDER — ENSURE MAX PROTEIN PO LIQD: 11.0000 [oz_av] | Freq: Two times a day (BID) | ORAL | Status: DC

## 2022-11-26 MED ORDER — ADULT MULTIVITAMIN W/MINERALS CH
1.0000 | ORAL_TABLET | Freq: Every day | ORAL | Status: DC
  Administered 2022-11-26 – 2022-11-28 (×3): 1 via ORAL
  Filled 2022-11-26 (×3): qty 1

## 2022-11-26 NOTE — Progress Notes (Signed)
Patient ambulatory this shift, pain controlled by PRN ultram . Appetite good , no complaints voiced at this time. Call bell and hydration within reach.

## 2022-11-26 NOTE — Progress Notes (Signed)
PROGRESS NOTE    Patient: Daryl Vega                            PCP: Nelly Laurence, NP                    DOB: 04/22/1946            DOA: 11/24/2022 ZOX:[id number]             DOS: 11/26/2022, 11:29 AM   LOS: 1 day   Date of Service: The patient was seen and examined on 11/26/2022  Subjective:   The patient was seen and examined this morning, stable no acute distress reporting of shortness of breath and cough with minimal exertion Hemodynamically stable, satting 98% on 4 L of oxygen  Tolerated bronchoscopy well...   Brief Narrative:   Daryl Vega is a 76 y.o. male with medical history significant for COPD, diabetes mellitus, pulmonary embolism, lung cancer. Patient presented to the ED with complaints of persistent difficulty breathing over the past month.  He denies worsening cough.  Reports he was diagnosed with pneumonia about 2 months ago, completed a course of Augmentin and Z-Pak subsequently Levaquin due to persistent of symptoms.  He reports gradual worsening of his breathing mostly with activity which improves when he stops to catch his breath.  No fevers no chills.  No chest pain.  Reports some mild lower extremity swelling just around his ankles.  He reports he no longer takes Eliquis.  He is not on home O2.   ED Course:  O2 sats dropped to 85% on room air with activity.  At rest sats greater than 96% on room air.  Heart rate 95-134.  Respirate rate 18-28.  Dimer 1.16.  BNP 312.  Lactic acid 2.1 > 1.3. X-ray showing chronic posttreatment changes within the right hemithorax.   Subsequent CTA chest-progressive postradiation changes with increased volume loss in the right apex, some increase in soft tissue density surrounding the right mainstem bronchus suspicious for soft tissue ingrowth into the trachea through the tracheal wall.  New from prior exam.  May represent progressive neoplasm. 500 mill bolus, and then 20 mg of Lasix given hospitalist called to  admit for acute hypoxic respiratory failure.     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      Assessment and Plan: * Acute respiratory failure with hypoxia (HCC) -In acute respiratory distress with shortness of breath, on admission was satting 85% on room air -Not O2 dependent at home -Requiring 4 L of oxygen, satting 98% this morning  -Mildly elevated BNP 312, no signs of significant volume overload or heart failure  -CTA chest- Subsequent CTA chest-progressive postradiation changes with increased volume loss in the right apex, some increase in soft tissue density surrounding the right mainstem bronchus suspicious for soft tissue ingrowth into the trachea through the tracheal wall.  New from prior exam.  May represent progressive neoplasm. -Possible mild component of COPD exacerbation, steroids and DuoNebs for now   -Discussed with pulmonologist/PCCM Dr. Elsworth Soho  Status post bronchoscopy on 11/25/2022, patient tolerated procedure well, lesion in the bronchus was not concerning for neoplasm per Dr. Elsworth Soho, brush  and cultures have been obtained  -Appreciate further recommendation per Dr. Elsworth Soho  -Continue to wean down supplemental oxygen, continue IV steroids, breathing treatments bronchodilators   Type 2 diabetes, controlled, 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HCC) - Hgba1c 8.1 - Hold glipizide and metformin -Anticipating hyperglycemia due to steroids -Will check CBG q. ACHS, SSI coverage   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 and BI 50% obst  Reports he has been on remission for 7 years, status post radiation and chemotherapy.  Primary pulmonology- Dr. Michela Pitcher and oncology with [facility]. -Status post bronchoscopy by Dr. Elsworth Soho 11/25/2022   -Left sided airways appeared patent.Mild amount of white secretions were noted. No endobronchial lesions seen.  Right mainstem bronchus was narrowed immediately after takeoff, the narrowing could not be traversed.  No endobronchial lesion noted. Carina did not show mass, mucosa appeared slightly inflamed BAL obtained from right mainstem bronchus.  Brushings were obtained from right mainstem bronchus and from carina  Specimen sent    COPD mixed type ([place]) -Continue to have shortness of breath, -Requiring 4 L of oxygen to maintain O2 sat of 98% - Faint wheezing.  Stable unchanged/chronic mild cough.  - Completed 2 courses of antibiotics in the past 2 months for pneumonia. -Per DuoNeb along with Ellipta - Breo inhalers -Continue with IV steroids -Hold off on antibiotics for now (afebrile, no leukocytosis)     ----------------------------------------------------------------------------------------------------------------------------------------------- Nutritional status:  The patient's BMI is: Body mass index is 24.94 kg/m. I agree with the assessment and plan as outlined -------------------------------------------------------------------------------------------------------------------------------------  DVT prophylaxis:  heparin injection 5,000 Units Start: 11/25/22 0600   Code Status:   Code Status: Full Code  Family Communication: No family member present at bedside- attempt will be made to update daily The above findings and plan of care has been discussed with patient (and family)  in detail,  they expressed understanding and agreement of above. -Advance care planning has been discussed.   Admission status:   Status is: Observation The patient remains OBS appropriate and will d/c before 2 midnights.     Procedures:   No admission procedures for hospital encounter.   Antimicrobials:  Anti-infectives (From admission, onward)    None        Medication:    fluticasone furoate-vilanterol  1 puff Inhalation Daily   And   umeclidinium bromide  1 puff Inhalation Daily   gabapentin  900 mg Oral TID   guaiFENesin-dextromethorphan  10 mL Oral Q6H   heparin  5,000 Units Subcutaneous Q8H   influenza vaccine adjuvanted  0.5 mL Intramuscular Tomorrow-1000   insulin aspart  0-15 Units Subcutaneous TID WC   insulin aspart  0-5 Units Subcutaneous QHS   ipratropium-albuterol  3 mL Nebulization Q4H   methylPREDNISolone (SOLU-MEDROL) injection  60 mg Intravenous Q12H   pneumococcal 20-valent conjugate vaccine  0.5 mL Intramuscular Tomorrow-1000   rosuvastatin  20 mg Oral QHS    acetaminophen **OR** acetaminophen, ondansetron **OR** ondansetron (ZOFRAN) IV, polyethylene glycol, traMADol   Objective:   Vitals:   11/25/22 1956 11/26/22 0435 11/26/22 0722 11/26/22 1121  BP: 135/79 116/75    Pulse: (!) 110 83    Resp: 20 20    Temp: 97.9 F (36.6 C) (!) 97.4 F (36.3 C)    TempSrc: Oral Oral    SpO2: 92% 97% 98% 99%  Weight:      Height:        Intake/Output Summary (Last 24 hours) at 11/26/2022 1129 Last data filed at  11/26/2022 0901 Gross per 24 hour  Intake 1300 ml  Output --  Net 1300 ml   Filed Weights   11/24/22 1548 11/25/22 1223  Weight: 74.4 kg 74.4 kg     Examination:      Physical Exam:   General:  AAO x 3,  cooperative, shortness of breath with minimal exertion  HEENT:  Normocephalic, PERRL, otherwise with in Normal limits   Neuro:  CNII-XII intact. , normal motor and sensation, reflexes intact   Lungs:   Clear to auscultation BL, Respirations unlabored,  Diffuse wheezes and rhonchi, negative crackles  Cardio:    S1/S2, RRR, No murmure, No Rubs or Gallops   Abdomen:  Soft, non-tender, bowel sounds active all four quadrants, no guarding or peritoneal signs.  Muscular  skeletal:  Limited exam -global generalized weaknesses - in bed, able to move all 4 extremities,   2+ pulses,  symmetric, No pitting edema  Skin:   Dry, warm to touch, negative for any Rashes,  Wounds: Please see nursing documentation          ------------------------------------------------------------------------------------------------------------------------------------------    LABs:     Latest Ref Rng & Units 11/25/2022    4:39 AM 11/24/2022    4:46 PM 05/04/2020    4:49 AM  CBC  WBC 4.0 - 10.5 K/uL 7.7  6.9  9.1   Hemoglobin 13.0 - 17.0 g/dL 13.6  13.1  11.6   Hematocrit 39.0 - 52.0 % 43.5  40.8  36.6   Platelets 150 - 400 K/uL 198  216  149       Latest Ref Rng & Units 11/25/2022    4:39 AM 11/24/2022    4:46 PM 05/04/2020    4:49 AM  CMP  Glucose 70 - 99 mg/dL 156  167  194   BUN 8 - 23 mg/dL _0 Creatinine 0.61 - 1.24 mg/dL 0.87  0.97  1.04   Sodium 135 - 145 mmol/L 140  140  135   Potassium 3.5 - 5.1 mmol/L 4.6  3.4  3.7   Chloride 98 - 111 mmol/L 109  106  106   CO2 22 - 32 mmol/L _1 Calcium 8.9 - 10.3 mg/dL 8.6  8.8  8.3   Total Protein 6.5 - 8.1 g/dL  8.6  6.9   Total Bilirubin 0.3 - 1.2 mg/dL  0.3  0.7   Alkaline Phos 38 - 126 U/L  110  67   AST 15 - 41 U/L  38  17   ALT 0 - 44 U/L  51  19        Micro Results Recent Results (from the past 240 hour(s))  Culture, blood (Routine x 2)     Status: None (Preliminary result)   Collection Time: 11/24/22  4:46 PM   Specimen: BLOOD RIGHT FOREARM  Result Value Ref Range Status   Specimen Description BLOOD RIGHT FOREARM  Final   Special Requests   Final    BOTTLES DRAWN AEROBIC AND ANAEROBIC Blood Culture results may not be optimal due to an excessive volume of blood received in culture bottles   Culture   Final    NO GROWTH 2 DAYS Performed at [facility], [address], [place], [place] [address]    Report Status PENDING  Incomplete  Culture, blood (Routine x 2)     Status: None (Preliminary result)   Collection Time: 11/24/22  4:46 PM   Specimen:  Left Antecubital; Blood  Result Value Ref Range Status   Specimen Description  LEFT ANTECUBITAL  Final   Special Requests   Final    BOTTLES DRAWN AEROBIC AND ANAEROBIC Blood Culture adequate volume   Culture   Final    NO GROWTH 2 DAYS Performed at [facility], [address] [place], [place] [address]    Report Status PENDING  Incomplete  Anaerobic culture w Gram Stain     Status: None (Preliminary result)   Collection Time: 11/25/22  2:40 PM   Specimen: Bronchial Washing, Right; Lung  Result Value Ref Range Status   Specimen Description   Final    BRONCHIAL WASHINGS RESISTANT Performed at [facility], [address], [place], [place] [address]    Special Requests   Final    NONE Performed at [facility], [address], [place], [place] [address]    Gram Stain   Final    RARE WBC PRESENT, PREDOMINANTLY PMN NO ORGANISMS SEEN Performed at [facility] [address], [place], [place] [address]    Culture PENDING  Incomplete   Report Status PENDING  Incomplete    Radiology Reports DG CHEST PORT 1 VIEW  Result Date: 11/26/2022 CLINICAL DATA:  Shortness of breath.  History of lung cancer. EXAM: PORTABLE CHEST 1 VIEW COMPARISON:  AP chest 11/24/2022, 05/02/2020; CT chest 11/24/2022 FINDINGS: There is again moderate right lung volume loss and rightward mediastinal shift. There is again homogeneous density within the right lung apex. Curvilinear scarring is again seen within the partially aerated right lung. Probable small right pleural effusion as seen on CT 2 days ago. No pneumothorax is seen. Unchanged minimal left basilar interstitial thickening. The visualized portions of the cardiac silhouette and mediastinal contours are within normal limits. No acute skeletal abnormality. IMPRESSION: 1. No significant interval change from recent prior chest x-rays. Moderate right lung volume loss and rightward mediastinal shift. 2. Homogeneous density within the right lung apex and curvilinear right lung scarring/post radiation posttreatment changes. 3. Small right  pleural effusion. Electronically Signed   By: Yvonne Kendall M.D.   On: 11/26/2022 08:34    SIGNED: Deatra James, MD, FHM. [facility],  Pager (please use [email or web address] to page/text) Please use Epic Secure Chat for non-urgent communication (7AM-7PM)  If 7PM-7AM, please contact night-coverage [email or web address], 11/26/2022, 11:29 AM

## 2022-11-26 NOTE — Progress Notes (Signed)
Initial Nutrition Assessment  INTERVENTION:   -Ensure MAX Protein po BID, each supplement provides 150 kcal and 30 grams of protein   -Multivitamin with minerals daily  NUTRITION DIAGNOSIS:   Increased nutrient needs related to chronic illness (COPD, h/o lung cancer) as evidenced by estimated needs.  GOAL:   Patient will meet greater than or equal to 90% of their needs  MONITOR:   PO intake, Supplement acceptance, Labs, Weight trends, I & O's  REASON FOR ASSESSMENT:   Malnutrition Screening Tool    ASSESSMENT:   76 y.o. male with medical history significant for COPD, diabetes mellitus, pulmonary embolism, lung cancer.  Patient presented to the ED with complaints of persistent difficulty breathing over the past month.  Patient is s/p bronchoscopy 12/12. Pt with history of lung cancer, was in remission following chemo and radiation. Pt with history of needing J-tube feedings.  Pt currently consuming 100% of meals since admission. CBGs have been elevated but this is related to prednisone.  Will order Ensure Max for additional protein and daily MVI.   Recent weights: 183 lbs on 12/10/21 166 lbs on 10/30/22 160 lbs on 11/10/22 158 lbs on 11/17/22 Current weight: 164 lbs Pt has lost 19 lbs since 12/27 (10% wt loss x 11.5 months, insignificant for time frame).  Pt with history of severe malnutrition.  Medications reviewed.  Labs reviewed:  CBGs: 222-366  NUTRITION - FOCUSED PHYSICAL EXAM:  Unable to complete, working remotely.  Diet Order:   Diet Order             Diet Carb Modified Fluid consistency: Thin; Room service appropriate? Yes  Diet effective now                   EDUCATION NEEDS:   No education needs have been identified at this time  Skin:  Skin Assessment: Reviewed RN Assessment  Last BM:  12/12  Height:   Ht Readings from Last 1 Encounters:  11/25/22 5\' 8"  (1.727 m)    Weight:   Wt Readings from Last 1 Encounters:  11/25/22 74.4  kg    BMI:  Body mass index is 24.94 kg/m.  Estimated Nutritional Needs:   Kcal:  2000-2200  Protein:  100-115g  Fluid:  2L/day   Clayton Bibles, MS, RD, LDN Inpatient Clinical Dietitian Contact information available via Amion

## 2022-11-26 NOTE — Inpatient Diabetes Management (Signed)
Inpatient Diabetes Program Recommendations  AACE/ADA: New Consensus Statement on Inpatient Glycemic Control (2015)  Target Ranges:  Prepandial:   less than 140 mg/dL      Peak postprandial:   less than 180 mg/dL (1-2 hours)      Critically ill patients:  140 - 180 mg/dL   Lab Results  Component Value Date   GLUCAP 271 (H) 11/26/2022   HGBA1C 8.3 (H) 05/03/2020    Review of Glycemic Control  Latest Reference Range & Units 11/25/22 16:30 11/25/22 20:03 11/26/22 08:06  Glucose-Capillary 70 - 99 mg/dL 222 (H) 366 (H) 271 (H)  (H): Data is abnormally high Diabetes history: Type 2 DM Outpatient Diabetes medications: Amaryl 4 mg QD, Metformin 500 mg BID Current orders for Inpatient glycemic control: Novolog 0-15 units TID & HS Solumedrol 60 mg BID Decadron 10 mg  x1  Inpatient Diabetes Program Recommendations:    In the setting of steroids, Consider adding Novolog 5 units TID (assuming patient is consuming >50% of meals).   Thanks, Bronson Curb, MSN, RNC-OB Diabetes Coordinator [phone] (8a-5p)

## 2022-11-26 NOTE — Anesthesia Postprocedure Evaluation (Signed)
Anesthesia Post Note  Patient: Daryl Vega  Procedure(s) Performed: FLEXIBLE BRONCHOSCOPY (Bilateral)  Patient location during evaluation: PACU Anesthesia Type: General Level of consciousness: awake and alert Pain management: pain level controlled Vital Signs Assessment: post-procedure vital signs reviewed and stable Respiratory status: spontaneous breathing, nonlabored ventilation, respiratory function stable and patient connected to nasal cannula oxygen Cardiovascular status: blood pressure returned to baseline and stable Postop Assessment: no apparent nausea or vomiting Anesthetic complications: no   There were no known notable events for this encounter.   Last Vitals:  Vitals:   11/26/22 0435 11/26/22 0722  BP: 116/75   Pulse: 83   Resp: 20   Temp: (!) 36.3 C   SpO2: 97% 98%    Last Pain:  Vitals:   11/26/22 0917  TempSrc:   PainSc: 6                  Trixie Rude

## 2022-11-26 NOTE — Progress Notes (Signed)
NAME:  Daryl Vega, MRN:  [id number], DOB:  1946-10-28, LOS: 1 ADMISSION DATE:  11/24/2022, CONSULTATION DATE:  11/26/2022  REFERRING MD:  shahmehdi, TRH, CHIEF COMPLAINT: Shortness of breath  History of Present Illness:  76 year old ex-smoker with history of COPD and lung cancer presents with shortness of breath for about 1 month.  He was found to be hypoxic to 85% and placed on 2 L nasal cannula He was treated as an outpatient about 2 months ago for pneumonia initially with Augmentin and then with Levaquin. CT angiogram chest showed radiation changes at the right apex with soft tissue density around the right mainstem bronchus and invading into carina through tracheal wall.  This was new compared to his last CT from 04/2020.  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 Randa Ngo and chemotherapy  -Area of right upper lobe consolidation evaluated by PET/CT 05/2020, no hypermetabolism  -Pulmonary embolism, discontinued apixaban 2018 -Diabetes type 2 -Chemotherapy-induced neuropathy  Significant Hospital Events: Including procedures, antibiotic start and stop dates in addition to other pertinent events     Interim History / Subjective:  Breathing improved, able to go to bathroom without oxygen Afebrile 99% on 4 L  Objective   Blood pressure 111/66, pulse (!) 107, temperature 98.2 F (36.8 C), temperature source Oral, resp. rate 20, height _0  (1.727 m), weight 74.4 kg, SpO2 94 %.        Intake/Output Summary (Last 24 hours) at 11/26/2022 1402 Last data filed at 11/26/2022 1248 Gross per 24 hour  Intake 1780 ml  Output --  Net 1780 ml     Filed Weights   11/24/22 1548 11/25/22 1223  Weight: 74.4 kg 74.4 kg    Examination: General: Adult man sitting up in bed, no distress HENT: Mild pallor, no icterus, no JVD Lungs: No accessory muscle use, decreased breath sounds bilateral Cardiovascular: No murmur, S1-S2 regular Abdomen: Soft, nontender, no organomegaly Extremities: No edema, no deformity Neuro: Alert oriented x 3, nonfocal   Labs show normal electrolytes, no leukocytosis. CBGs high, 361 CT chest independently reviewed  Resolved Hospital Problem list     Assessment & Plan:  Concern for recurrence of lung malignancy invading right mainstem bronchus and carina, status post radiation to right upper lobe with radiation changes and chemotherapy in 2015 when he presented with stage IIIb squamous cell lung cancer. Fortunately bronchoscopy only showed thick mucus at the carina, no tracheal or endobronchial lesions.  Significant narrowing of the right upper lobe bronchus which could not be traversed by bronchoscope, due to radiation scarring  Lung cancer -await brushings cytology, he has follow-up with oncologist  COPD -no evidence of exacerbation Can use budesonide/Brovana/Yupelri combination while hospitalized and resume Trelegy as outpatient Await BAL culture and can use antibiotics if positive  Acute hypoxic respiratory failure -reassess need for oxygen on discharge  PCCM will be available as needed  Best Practice (right click and "Reselect all SmartList Selections" daily)   Per TRH  Labs   CBC: Recent Labs  Lab 11/24/22 1646 11/25/22 0439  WBC 6.9 7.7  NEUTROABS 4.7  --   HGB 13.1 13.6  HCT 40.8 43.5  MCV 84.1 85.8  PLT  216 198     Basic Metabolic Panel: Recent Labs  Lab 11/24/22 1646 11/25/22 0439  NA 140 140  K 3.4* 4.6  CL 106 109  CO2 23 23  GLUCOSE 167* 156*  BUN 16 14  CREATININE 0.97 0.87  CALCIUM 8.8* 8.6*    GFR: Estimated Creatinine Clearance: 71 mL/min (by C-G  formula based on SCr of 0.87 mg/dL). Recent Labs  Lab 11/24/22 1646 11/24/22 1926 11/25/22 0439  WBC 6.9  --  7.7  LATICACIDVEN 2.1* 1.3  --      Liver Function Tests: Recent Labs  Lab 11/24/22 1646  AST 38  ALT 51*  ALKPHOS 110  BILITOT 0.3  PROT 8.6*  ALBUMIN 3.4*    No results for input(s): "LIPASE", "AMYLASE" in the last 168 hours. No results for input(s): "AMMONIA" in the last 168 hours.  ABG    Component Value Date/Time   PHART 7.341 (L) 12/01/2014 0910   PCO2ART 27.6 (L) 12/01/2014 0910   PO2ART 128.0 (H) 12/01/2014 0910   HCO3 14.5 (L) 12/01/2014 0910   TCO2 29 05/02/2020 1857   ACIDBASEDEF 9.7 (H) 12/01/2014 0910   O2SAT 99.0 12/01/2014 0910     Coagulation Profile: Recent Labs  Lab 11/24/22 1646  INR 1.0     Cardiac Enzymes: No results for input(s): "CKTOTAL", "CKMB", "CKMBINDEX", "TROPONINI" in the last 168 hours.  HbA1C: Hgb A1c MFr Bld  Date/Time Value Ref Range Status  05/03/2020 09:12 AM 8.3 (H) 4.8 - 5.6 % Final    Comment:    (NOTE) Pre diabetes:          5.7%-6.4% Diabetes:              >6.4% Glycemic control for   <7.0% adults with diabetes   05/02/2020 10:17 PM 8.4 (H) 4.8 - 5.6 % Final    Comment:    (NOTE)         Prediabetes: 5.7 - 6.4         Diabetes: >6.4         Glycemic control for adults with diabetes: <7.0     CBG: Recent Labs  Lab 11/25/22 1114 11/25/22 1630 11/25/22 2003 11/26/22 0806 11/26/22 1200  GLUCAP 255* 222* 366* Pepper Pike MD. FCCP. [facility] Pager : [phone]  If no response to pager , please call [phone] until 7 pm After 7:00 pm call Elink  [phone]   11/26/2022

## 2022-11-26 NOTE — Progress Notes (Signed)
Patient's blood glucose is 307. MD Adefeso notified. Advised to follow the sliding scale and give 4 units of Novolog.

## 2022-11-26 NOTE — Progress Notes (Signed)
Patient's blood glucose is 415.    11/26/22 2134  Provider Notification  Provider Name/Title Dr. Josephine Cables  Date Provider Notified 11/26/22  Time Provider Notified 2136  Method of Notification Page (via secure chat)  Notification Reason Critical Result  Test performed and critical result Blood Glucose (cbg 415)  Date Critical Result Received 11/26/22  Time Critical Result Received 2137

## 2022-11-27 LAB — GLUCOSE, CAPILLARY
Glucose-Capillary: 293 mg/dL — ABNORMAL HIGH (ref 70–99)
Glucose-Capillary: 334 mg/dL — ABNORMAL HIGH (ref 70–99)
Glucose-Capillary: 320 mg/dL — ABNORMAL HIGH (ref 70–99)
Glucose-Capillary: 320 mg/dL — ABNORMAL HIGH (ref 70–99)
Glucose-Capillary: 293 mg/dL — ABNORMAL HIGH (ref 70–99)
Glucose-Capillary: 354 mg/dL — ABNORMAL HIGH (ref 70–99)

## 2022-11-27 MED ORDER — ALUM & MAG HYDROXIDE-SIMETH 200-200-20 MG/5ML PO SUSP
30.0000 mL | ORAL | Status: DC | PRN
  Filled 2022-11-27: qty 30

## 2022-11-27 MED ORDER — IPRATROPIUM-ALBUTEROL 0.5-2.5 (3) MG/3ML IN SOLN
3.0000 mL | Freq: Four times a day (QID) | RESPIRATORY_TRACT | Status: DC
  Administered 2022-11-27 – 2022-11-28 (×6): 3 mL via RESPIRATORY_TRACT
  Filled 2022-11-27 (×6): qty 3

## 2022-11-27 MED ORDER — CALCIUM CARBONATE ANTACID 500 MG PO CHEW
400.0000 mg | CHEWABLE_TABLET | Freq: Once | ORAL | Status: AC
  Administered 2022-11-27: 400 mg via ORAL
  Filled 2022-11-27: qty 2

## 2022-11-27 NOTE — Inpatient Diabetes Management (Signed)
Inpatient Diabetes Program Recommendations  AACE/ADA: New Consensus Statement on Inpatient Glycemic Control (2015)  Target Ranges:  Prepandial:   less than 140 mg/dL      Peak postprandial:   less than 180 mg/dL (1-2 hours)      Critically ill patients:  140 - 180 mg/dL   Lab Results  Component Value Date   GLUCAP 334 (H) 11/27/2022   HGBA1C 8.3 (H) 05/03/2020    Review of Glycemic Control  Diabetes history: Type 2 DM Outpatient Diabetes medications: Amaryl 4 mg QD, Metformin 500 mg BID Current orders for Inpatient glycemic control: Novolog 0-15 units TID & HS Solumedrol 60 mg BID Decadron 10 mg  x1   Inpatient Diabetes Program Recommendations:     In the setting of steroids, Consider  -Adding Levemir 10 units BID -adding Novolog 8 units TID (assuming patient is consuming >50% of meals).   Secure chat sent again.   Thanks, Bronson Curb, MSN, RNC-OB Diabetes Coordinator [phone] (8a-5p)

## 2022-11-27 NOTE — Progress Notes (Signed)
PROGRESS NOTE    Patient: Daryl Vega                            PCP: Nelly Laurence, NP                    DOB: 13-Sep-1946            DOA: 11/24/2022 MPN:[id number]             DOS: 11/27/2022, 5:09 PM   LOS: 2 days   Date of Service: The patient was seen and examined on 11/27/2022  Subjective:   The patient was seen and examined this morning, stable no acute distress reporting of shortness of breath and cough with minimal exertion Hemodynamically stable, satting 98% on 4 L of oxygen  Tolerated bronchoscopy well...   Brief Narrative:   Daryl Vega is a 76 y.o. male with medical history significant for COPD, diabetes mellitus, pulmonary embolism, lung cancer. Patient presented to the ED with complaints of persistent difficulty breathing over the past month.  He denies worsening cough.  Reports he was diagnosed with pneumonia about 2 months ago, completed a course of Augmentin and Z-Pak subsequently Levaquin due to persistent of symptoms.  He reports gradual worsening of his breathing mostly with activity which improves when he stops to catch his breath.  No fevers no chills.  No chest pain.  Reports some mild lower extremity swelling just around his ankles.  He reports he no longer takes Eliquis.  He is not on home O2.   ED Course:  O2 sats dropped to 85% on room air with activity.  At rest sats greater than 96% on room air.  Heart rate 95-134.  Respirate rate 18-28.  Dimer 1.16.  BNP 312.  Lactic acid 2.1 > 1.3. X-ray showing chronic posttreatment changes within the right hemithorax.   Subsequent CTA chest-progressive postradiation changes with increased volume loss in the right apex, some increase in soft tissue density surrounding the right mainstem bronchus suspicious for soft tissue ingrowth into the trachea through the tracheal wall.  New from prior exam.  May represent progressive neoplasm. 500 mill bolus, and then 20 mg of Lasix given hospitalist called to  admit for acute hypoxic respiratory failure.     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      Assessment and Plan: * Acute respiratory failure with hypoxia (HCC) -In acute respiratory distress with shortness of breath, on admission was satting 85% on room air -Not O2 dependent at home -Shortness of breath and hypoxia improving -Mildly elevated BNP 312, no signs of significant volume overload or heart failure -Pathology from bronchoscopy sample pending  -CTA chest- Subsequent CTA chest-progressive postradiation changes with increased volume loss in the right apex, some increase in soft tissue density surrounding the right mainstem bronchus suspicious for soft tissue ingrowth into the trachea through the tracheal wall.  New from prior exam.  May represent progressive neoplasm. -Possible mild component of COPD exacerbation, steroids and DuoNebs for now --Discussed with pulmonologist/PCCM Dr. Elsworth Soho  Status post bronchoscopy on 11/25/2022, patient tolerated procedure well, lesion in the bronchus was not concerning for neoplasm per Dr. 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  Primary pulmonology- Dr. Michela Pitcher and oncology with [facility]. -Status post bronchoscopy by Dr. Elsworth Soho 11/25/2022  -Left sided airways appeared patent.Mild amount of white secretions were noted. No endobronchial lesions seen.  Right mainstem bronchus was narrowed immediately after takeoff, the narrowing could not be traversed.  No endobronchial lesion noted. Carina did not show mass, mucosa appeared slightly inflamed BAL obtained from right mainstem bronchus.  Brushings were obtained from right mainstem bronchus and from carina  -Pathology report from bronchoscopy done on 11/25/2022 pending    COPD mixed type ([place]) -Continue to have shortness of breath, --Oxygen requirement and respiratory status improving slowly.  - Completed 2 courses of antibiotics in the past 2 months for pneumonia. -Per DuoNeb along with Ellipta - Breo inhalers -Continue with IV steroids  ----------------------------------------------------------------------------------------------------------------------------------------------- Nutritional status:  The patient's BMI is: Body mass index is 24.94 kg/m. I agree with the assessment and plan as outlined -------------------------------------------------------------------------------------------------------------------------------------  DVT prophylaxis:  heparin injection 5,000 Units Start: 11/25/22 0600   Code Status:   Code Status: Full Code  Family Communication: No family member present at bedside- attempt will be made to update daily The above findings and plan of care has been discussed with patient (and family)  in detail,  they expressed understanding and agreement of above. -Advance care planning has been discussed.     Procedures:   Antimicrobials:  Anti-infectives (From admission, onward)    None        Medication:   fluticasone furoate-vilanterol  1 puff Inhalation Daily   And   umeclidinium bromide  1 puff Inhalation Daily   gabapentin  900 mg Oral TID   guaiFENesin-dextromethorphan  10 mL  Oral Q6H   heparin  5,000 Units Subcutaneous Q8H   influenza vaccine adjuvanted  0.5 mL Intramuscular Tomorrow-1000   insulin aspart  0-20 Units Subcutaneous TID WC   insulin aspart  0-5 Units Subcutaneous QHS   ipratropium-albuterol  3 mL Nebulization QID   methylPREDNISolone (SOLU-MEDROL) injection  60 mg Intravenous Q12H   multivitamin with minerals  1 tablet Oral Daily   pneumococcal 20-valent conjugate vaccine  0.5 mL Intramuscular Tomorrow-1000   Ensure Max Protein  11 oz Oral BID   rosuvastatin  20 mg Oral QHS    acetaminophen **OR** acetaminophen, ondansetron **OR** ondansetron (ZOFRAN) IV, polyethylene glycol, traMADol   Objective:   Vitals:   11/27/22 0959 11/27/22 1120 11/27/22 1250 11/27/22 1625  BP:   (!) 106/54   Pulse:   (!) 101   Resp:      Temp:   98.1 F (36.7 C)   TempSrc:   Oral   SpO2: 91% 97% 93% 94%  Weight:      Height:        Intake/Output Summary (Last 24 hours) at 11/27/2022 1709 Last data filed at 11/27/2022 1300 Gross per 24 hour  Intake 960 ml  Output --  Net 960 ml   Filed Weights   11/24/22 1548 11/25/22 1223  Weight: 74.4 kg  74.4 kg     Examination:      Physical Exam:   General:  AAO x 3,  cooperative, shortness of breath with minimal exertion  HEENT:  Normocephalic, PERRL, otherwise with in Normal limits   Neuro:  CNII-XII intact. , normal motor and sensation, reflexes intact   Lungs:   Clear to auscultation BL, Respirations unlabored,  Diffuse wheezes and rhonchi, negative crackles  Cardio:    S1/S2, RRR, No murmure, No Rubs or Gallops   Abdomen:  Soft, non-tender, bowel sounds active all four quadrants, no guarding or peritoneal signs.  Muscular  skeletal:  Limited exam -global generalized weaknesses - in bed, able to move all 4 extremities,   2+ pulses,  symmetric, No pitting edema  Skin:  Dry, warm to touch, negative for any Rashes,  Wounds: Please see nursing documentation       ------------------------------------------------------------------------------------------------------------------------------------------    LABs:     Latest Ref Rng & Units 11/25/2022    4:39 AM 11/24/2022    4:46 PM 05/04/2020    4:49 AM  CBC  WBC 4.0 - 10.5 K/uL 7.7  6.9  9.1   Hemoglobin 13.0 - 17.0 g/dL 13.6  13.1  11.6   Hematocrit 39.0 - 52.0 % 43.5  40.8  36.6   Platelets 150 - 400 K/uL 198  216  149       Latest Ref Rng & Units 11/26/2022   10:34 PM 11/25/2022    4:39 AM 11/24/2022    4:46 PM  CMP  Glucose 70 - 99 mg/dL 385  156  167   BUN 8 - 23 mg/dL  14  16   Creatinine 0.61 - 1.24 mg/dL  0.87  0.97   Sodium 135 - 145 mmol/L  140  140   Potassium 3.5 - 5.1 mmol/L  4.6  3.4   Chloride 98 - 111 mmol/L  109  106   CO2 22 - 32 mmol/L  23  23   Calcium 8.9 - 10.3 mg/dL  8.6  8.8   Total Protein 6.5 - 8.1 g/dL   8.6   Total Bilirubin 0.3 - 1.2 mg/dL   0.3   Alkaline Phos 38 - 126 U/L   110   AST 15 - 41 U/L   38   ALT 0 - 44 U/L   51      Micro Results Recent Results (from the past 240 hour(s))  Culture, blood (Routine x 2)     Status: None (Preliminary result)   Collection Time: 11/24/22  4:46 PM   Specimen: BLOOD RIGHT FOREARM  Result Value Ref Range Status   Specimen Description BLOOD RIGHT FOREARM  Final   Special Requests   Final    BOTTLES DRAWN AEROBIC AND ANAEROBIC Blood Culture results may not be optimal due to an excessive volume of blood received in culture bottles   Culture   Final    NO GROWTH 3 DAYS Performed at [facility], [address] [place], [place] [address]    Report Status PENDING  Incomplete  Culture, blood (Routine x 2)     Status: None (Preliminary result)   Collection Time: 11/24/22  4:46 PM   Specimen: Left Antecubital; Blood  Result Value Ref Range Status   Specimen Description LEFT ANTECUBITAL  Final   Special Requests   Final    BOTTLES DRAWN AEROBIC AND ANAEROBIC Blood Culture adequate volume   Culture   Final     NO GROWTH 3 DAYS  Performed at [facility], [address] [place], [place] [address]    Report Status PENDING  Incomplete  Anaerobic culture w Gram Stain     Status: None (Preliminary result)   Collection Time: 11/25/22  2:40 PM   Specimen: Bronchial Washing, Right; Lung  Result Value Ref Range Status   Specimen Description   Final    BRONCHIAL WASHINGS RESISTANT Performed at [facility], [address], [place], [place] [address]    Special Requests   Final    NONE Performed at [facility], [address] [place], [place] [address]    Gram Stain   Final    RARE WBC PRESENT, PREDOMINANTLY PMN NO ORGANISMS SEEN Performed at [place] [address], [place], [place] [address]    Culture   Final    NO ANAEROBES ISOLATED; CULTURE IN PROGRESS FOR 5 DAYS   Report Status PENDING  Incomplete    Radiology Reports No results found.  SIGNED: Roxan Hockey, MD,  [facility],  Pager (please use [email or web address] to page/text) Please use Epic Secure Chat for non-urgent communication (7AM-7PM)  If 7PM-7AM, please contact night-coverage [email or web address], 11/27/2022, 5:09 PM

## 2022-11-28 LAB — GLUCOSE, CAPILLARY
Glucose-Capillary: 444 mg/dL — ABNORMAL HIGH (ref 70–99)
Glucose-Capillary: 298 mg/dL — ABNORMAL HIGH (ref 70–99)

## 2022-11-28 LAB — CYTOLOGY - NON PAP

## 2022-11-28 MED ORDER — INSULIN GLARGINE-YFGN 100 UNIT/ML ~~LOC~~ SOLN
10.0000 [IU] | Freq: Every day | SUBCUTANEOUS | Status: DC
  Administered 2022-11-28: 10 [IU] via SUBCUTANEOUS
  Filled 2022-11-28 (×2): qty 0.1

## 2022-11-28 MED ORDER — COMPRESSOR/NEBULIZER MISC: 1.0000 [IU] | Freq: Every day | 0 refills | Status: DC | PRN

## 2022-11-28 MED ORDER — DOXYCYCLINE HYCLATE 100 MG PO TABS: 100.0000 mg | ORAL_TABLET | Freq: Two times a day (BID) | ORAL | 0 refills | Status: DC

## 2022-11-28 MED ORDER — INSULIN ASPART 100 UNIT/ML IJ SOLN
26.0000 [IU] | Freq: Once | INTRAMUSCULAR | Status: AC
  Administered 2022-11-28: 26 [IU] via SUBCUTANEOUS

## 2022-11-28 MED ORDER — ALBUTEROL SULFATE (2.5 MG/3ML) 0.083% IN NEBU: 2.5000 mg | INHALATION_SOLUTION | RESPIRATORY_TRACT | 2 refills | Status: DC | PRN

## 2022-11-28 MED ORDER — GLIPIZIDE ER 10 MG PO TB24: 10.0000 mg | ORAL_TABLET | Freq: Every day | ORAL | 2 refills | Status: AC

## 2022-11-28 MED ORDER — METFORMIN HCL 1000 MG PO TABS: 1000.0000 mg | ORAL_TABLET | Freq: Two times a day (BID) | ORAL | 3 refills | Status: DC

## 2022-11-28 MED ORDER — INSULIN GLARGINE-YFGN 100 UNIT/ML ~~LOC~~ SOLN
20.0000 [IU] | Freq: Once | SUBCUTANEOUS | Status: AC
  Administered 2022-11-28: 20 [IU] via SUBCUTANEOUS
  Filled 2022-11-28: qty 0.2

## 2022-11-28 MED ORDER — PREDNISONE 20 MG PO TABS: 40.0000 mg | ORAL_TABLET | Freq: Every day | ORAL | 0 refills | Status: DC

## 2022-11-28 MED ORDER — ALBUTEROL SULFATE HFA 108 (90 BASE) MCG/ACT IN AERS: 2.0000 | INHALATION_SPRAY | RESPIRATORY_TRACT | 3 refills | Status: AC | PRN

## 2022-11-28 NOTE — TOC Transition Note (Signed)
Transition of Care [facility]) - CM/SW Discharge Note   Patient Details  Name: Wendall Isabell MRN: [id number] Date of Birth: [birth year]  Transition of Care [facility]) CM/SW Contact:  Boneta Lucks, RN Phone Number: 11/28/2022, 11:45 AM   Clinical Narrative:   Patient discharging home. MD ordered Neb machine. CM delivered from supply close and fill out form for [place] with [employer] to pick up.     Final next level of care: Home/Self Care Barriers to Discharge: Barriers Resolved   Patient Goals and CMS Choice Patient states their goals for this hospitalization and ongoing recovery are:: to go home CMS Medicare.gov Compare Post Acute Care list provided to:: Patient Choice offered to / list presented to : Patient  Discharge Placement                Patient to be transferred to facility by: Home - Neighbor   Patient and family notified of of transfer: 11/28/22  Discharge Plan and Services                 DME Agency: [employer] Date DME Agency Contacted: 11/28/22 Time DME Agency Contacted: [phone] Representative spoke with at DME Agency: Erasmo Downer     Social Determinants of Health ([place]) Interventions Food Insecurity Interventions: Intervention Not Indicated Housing Interventions: Intervention Not Indicated Transportation Interventions: Intervention Not Indicated Utilities Interventions: Intervention Not Indicated   Readmission Risk Interventions     No data to display

## 2022-11-28 NOTE — Inpatient Diabetes Management (Signed)
Inpatient Diabetes Program Recommendations  AACE/ADA: New Consensus Statement on Inpatient Glycemic Control  Target Ranges:  Prepandial:   less than 140 mg/dL      Peak postprandial:   less than 180 mg/dL (1-2 hours)      Critically ill patients:  140 - 180 mg/dL    Latest Reference Range & Units 11/27/22 07:38 11/27/22 11:31 11/27/22 16:21 11/27/22 20:56 11/28/22 07:24  Glucose-Capillary 70 - 99 mg/dL 334 (H) 320 (H) 293 (H) 354 (H) 444 (H)   Review of Glycemic Control  Diabetes history: DM2 Outpatient Diabetes medications: Amaryl 4 mg daily, Metformin 500 mg BID Current orders for Inpatient glycemic control: Semglee 10 units daily, Novolog 0-20 units TID with meals, Novolog 0-5 units QHS; Solumedrol 60 mg Q12H  Inpatient Diabetes Program Recommendations:    Insulin: CBG 444 mg/dl today and Semglee 10 units daily ordered today. If steroids are continued as ordered, please consider increasing Semglee to 10 units BID and ordering Novolog 6 units TID with meals for meal coverage if patient eats at least 50% of meals.  Thanks, Barnie Alderman, RN, MSN, [place] Diabetes Coordinator Inpatient Diabetes Program [phone] (Team Pager from 8am to [place])

## 2022-11-28 NOTE — Progress Notes (Signed)
Patients blood glucose 444 mg/dL. MD Courage made aware. New orders placed.

## 2022-11-28 NOTE — Progress Notes (Signed)
Patient discharged home today, transported home by friend,. Discharge paperwork went over with patient, patient verbalized understanding. Belongings sent home with patient.

## 2022-11-28 NOTE — Care Management Important Message (Signed)
Important Message  Patient Details  Name: Daryl Vega MRN: [id number] Date of Birth: Sep 22, 1946   Medicare Important Message Given:  Yes     Tommy Medal 11/28/2022, 11:27 AM

## 2022-11-29 LAB — CULTURE, BLOOD (ROUTINE X 2)
Culture: NO GROWTH
Culture: NO GROWTH
Special Requests: ADEQUATE

## 2022-11-30 LAB — ANAEROBIC CULTURE W GRAM STAIN

## 2022-12-01 ENCOUNTER — Encounter (HOSPITAL_COMMUNITY): Payer: Self-pay | Admitting: Pulmonary Disease

## 2022-12-03 ENCOUNTER — Ambulatory Visit: Payer: Medicare Other | Admitting: Pulmonary Disease

## 2022-12-03 ENCOUNTER — Encounter: Payer: Self-pay | Admitting: Pulmonary Disease

## 2022-12-03 ENCOUNTER — Other Ambulatory Visit: Payer: Self-pay

## 2022-12-03 VITALS — BP 130/76 | HR 114 | Temp 97.6°F | Ht 68.0 in | Wt 150.6 lb

## 2022-12-03 DIAGNOSIS — C3401 Malignant neoplasm of right main bronchus: Secondary | ICD-10-CM

## 2022-12-03 DIAGNOSIS — J9611 Chronic respiratory failure with hypoxia: Secondary | ICD-10-CM

## 2022-12-03 DIAGNOSIS — R0902 Hypoxemia: Secondary | ICD-10-CM | POA: Diagnosis not present

## 2022-12-03 DIAGNOSIS — J449 Chronic obstructive pulmonary disease, unspecified: Secondary | ICD-10-CM

## 2022-12-03 MED ORDER — NYSTATIN 100000 UNIT/ML MT SUSP: 5.0000 mL | Freq: Four times a day (QID) | OROMUCOSAL | 0 refills | Status: DC

## 2022-12-03 MED ORDER — ALBUTEROL SULFATE (2.5 MG/3ML) 0.083% IN NEBU: 2.5000 mg | INHALATION_SOLUTION | RESPIRATORY_TRACT | 11 refills | Status: AC | PRN

## 2022-12-03 NOTE — Assessment & Plan Note (Signed)
He qualifies for oxygen on ambulation today.  He states that he will only use oxygen if he is provided with a portable unit.  We will send a prescription to [facility] directly to provide a POC 2 L pounds.  Would like him to use it at night too when he sleeps

## 2022-12-03 NOTE — Assessment & Plan Note (Signed)
He will continue on Trelegy stimulants his mouth after use.  He has normal thrush today. Prescription for nystatin will be provided. Will give him albuterol nebs to put in his nebulizer. He will complete course of prednisone 20 was provided on discharge for his recent exacerbation I discussed COPD action plan and signs and symptoms of COPD exacerbation

## 2022-12-03 NOTE — Discharge Summary (Signed)
Physician Discharge Summary   Patient: Daryl Vega MRN: [id number] DOB: [birth year]  Admit date:     11/24/2022  Discharge date: 11/28/2022  Discharge Physician: Roxan Hockey   PCP: Nelly Laurence, NP   Recommendations at discharge:     Discharge Diagnoses: Active Problems:   Type 2 diabetes, controlled, 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HCC)   COPD mixed type (HCC)   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 and BI 50% obst   Principal Problem (Resolved):   Acute respiratory failure with hypoxia [facility])  Hospital Course: Daryl Vega is a 76 y.o. male with medical history significant for COPD, diabetes mellitus, pulmonary embolism, lung cancer. Patient presented to the ED with complaints of persistent difficulty breathing over the past month.  He denies worsening cough.  Reports he was diagnosed with pneumonia about 2 months ago, completed a course of Augmentin and Z-Pak subsequently Levaquin due to persistent of symptoms.  He reports gradual worsening of his breathing mostly with activity which improves when he stops to catch his breath.  No fevers no chills.  No chest pain.  Reports some mild lower extremity swelling just around his ankles.  He reports he no longer takes Eliquis.  He is not on home O2.   ED Course:  O2 sats dropped to 85% on room air with activity.  At rest sats greater than 96% on room air.  Heart rate 95-134.  Respirate rate 18-28.  Dimer 1.16.  BNP 312.  Lactic acid 2.1 > 1.3. X-ray showing chronic posttreatment changes within the right hemithorax.   Subsequent CTA chest-progressive postradiation changes with increased volume loss in the right apex, some increase in soft tissue density surrounding the right mainstem bronchus suspicious for soft tissue ingrowth into the trachea through the tracheal wall.  New from prior exam.  May represent progressive neoplasm. 500 mill bolus, and then 20 mg of Lasix given hospitalist called to admit for  acute hypoxic respiratory failure.   Assessment and Plan: Acute respiratory failure with hypoxia (HCC) -Not O2 dependent at home -Shortness of breath and hypoxia Resolved -Mildly elevated BNP 312, no signs of significant volume overload or heart failure -Pathology from bronchoscopy sample pending   -CTA chest- Subsequent CTA chest-progressive postradiation changes with increased volume loss in the right apex, some increase in soft tissue density surrounding the right mainstem bronchus suspicious for soft tissue ingrowth into the trachea through the tracheal wall.  New from prior exam.  May represent progressive neoplasm. -Possible mild component of COPD exacerbation, steroids and DuoNebs for now --Discussed with pulmonologist/PCCM Dr. Elsworth Soho  Status post bronchoscopy on 11/25/2022, patient tolerated procedure well, lesion in the bronchus was not concerning for neoplasm per Dr. Elsworth Soho, brush and cultures have been obtained -Official pathology result pending -Much improved with steroids and bronchodilators and mucolytics -Weaned off oxygen completely, -No dyspnea at rest, post ambulation O2 sats 93 to 95% on room air     Type 2 diabetes, controlled, 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[place]) - Hgba1c 8.1-reflecting uncontrolled diabetes with hyperglycemia PTA -Resume glipizide and metformin    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 and BI 50% obst  Reports he has been on remission for 7 years, status post radiation and chemotherapy.  Primary pulmonology- Dr. Michela Pitcher and oncology with [facility] health. -Status post bronchoscopy by Dr. Elsworth Soho 11/25/2022  -Left sided airways appeared patent.Mild amount of white secretions were noted. No endobronchial lesions seen.  Right mainstem bronchus  was narrowed immediately after takeoff, the narrowing could not be traversed.  No endobronchial lesion noted. Carina did not show mass, mucosa appeared slightly inflamed BAL obtained from right mainstem  bronchus.  Brushings were obtained from right mainstem bronchus and from carina  -Pathology report from bronchoscopy done on 11/25/2022 pending       COPD mixed type ([place]) -Continue to have shortness of breath, --Oxygen requirement and respiratory status improving slowly.  - Completed 2 courses of antibiotics in the past 2 months for pneumonia. -Per DuoNeb along with Ellipta - Breo inhalers Much improved with steroids please see #1 above     Consultants: Pulmonologist Dr. Elsworth Soho Procedures performed: Bronchoscopy on 11/25/2022 Disposition: Home Diet recommendation:  Discharge Diet Orders (From admission, onward)     Start     Ordered   11/28/22 0000  Diet - low sodium heart healthy        11/28/22 1056           Cardiac and Carb modified diet DISCHARGE MEDICATION: Allergies as of 11/28/2022   No Known Allergies      Medication List     STOP taking these medications    bismuth subsalicylate 833 AS/50NL suspension Commonly known as: PEPTO BISMOL   glimepiride 4 MG tablet Commonly known as: AMARYL       TAKE these medications    acetaminophen 325 MG tablet Commonly known as: TYLENOL Take 2 tablets (650 mg total) by mouth every 6 (six) hours as needed for mild pain (or Fever >/= 101).   albuterol 108 (90 Base) MCG/ACT inhaler Commonly known as: VENTOLIN HFA Inhale 2 puffs into the lungs every 4 (four) hours as needed.   clotrimazole-betamethasone cream Commonly known as: LOTRISONE Apply 1 application topically 2 (two) times daily as needed (dry skin).   Compressor/Nebulizer Misc 1 Units by Does not apply route daily as needed.   gabapentin 300 MG capsule Commonly known as: NEURONTIN Take 900 mg by mouth 3 (three) times daily.   glipiZIDE 10 MG 24 hr tablet Commonly known as: GLUCOTROL XL Take 1 tablet (10 mg total) by mouth daily with breakfast. What changed: when to take this   metFORMIN 1000 MG tablet Commonly known as: GLUCOPHAGE Take 1  tablet (1,000 mg total) by mouth 2 (two) times daily with a meal. What changed:  medication strength how much to take when to take this   mirtazapine 7.5 MG tablet Commonly known as: REMERON Take by mouth.   rosuvastatin 20 MG tablet Commonly known as: CRESTOR Take 20 mg by mouth at bedtime.   temazepam 15 MG capsule Commonly known as: RESTORIL Take by mouth.   traMADol 50 MG tablet Commonly known as: ULTRAM Take 50 mg by mouth every 6 (six) hours as needed for moderate pain or severe pain.   Trelegy Ellipta 100-62.5-25 MCG/ACT Aepb Generic drug: Fluticasone-Umeclidin-Vilant Inhale 1 puff into the lungs daily.               Durable Medical Equipment  (From admission, onward)           Start     Ordered   11/28/22 0000  For home use only DME Nebulizer machine       Question Answer Comment  Patient needs a nebulizer to treat with the following condition COPD (chronic obstructive pulmonary disease) ([place])   Length of Need Lifetime      11/28/22 1056            Discharge Exam: [employer]  Weights   11/24/22 1548 11/25/22 1223  Weight: 74.4 kg 74.4 kg   Condition at discharge: stable  The results of significant diagnostics from this hospitalization (including imaging, microbiology, ancillary and laboratory) are listed below for reference.   Imaging Studies: DG CHEST PORT 1 VIEW  Result Date: 11/26/2022 CLINICAL DATA:  Shortness of breath.  History of lung cancer. EXAM: PORTABLE CHEST 1 VIEW COMPARISON:  AP chest 11/24/2022, 05/02/2020; CT chest 11/24/2022 FINDINGS: There is again moderate right lung volume loss and rightward mediastinal shift. There is again homogeneous density within the right lung apex. Curvilinear scarring is again seen within the partially aerated right lung. Probable small right pleural effusion as seen on CT 2 days ago. No pneumothorax is seen. Unchanged minimal left basilar interstitial thickening. The visualized portions of the cardiac  silhouette and mediastinal contours are within normal limits. No acute skeletal abnormality. IMPRESSION: 1. No significant interval change from recent prior chest x-rays. Moderate right lung volume loss and rightward mediastinal shift. 2. Homogeneous density within the right lung apex and curvilinear right lung scarring/post radiation posttreatment changes. 3. Small right pleural effusion. Electronically Signed   By: Yvonne Kendall M.D.   On: 11/26/2022 08:34   CT Angio Chest PE W and/or Wo Contrast  Result Date: 11/24/2022 CLINICAL DATA:  Positive D-dimer and difficulty breathing EXAM: CT ANGIOGRAPHY CHEST WITH CONTRAST TECHNIQUE: Multidetector CT imaging of the chest was performed using the standard protocol during bolus administration of intravenous contrast. Multiplanar CT image reconstructions and MIPs were obtained to evaluate the vascular anatomy. RADIATION DOSE REDUCTION: This exam was performed according to the departmental dose-optimization program which includes automated exposure control, adjustment of the mA and/or kV according to patient size and/or use of iterative reconstruction technique. CONTRAST:  41m OMNIPAQUE IOHEXOL 350 MG/ML SOLN COMPARISON:  05/02/2020 CT, chest x-ray from earlier in the same day. FINDINGS: Cardiovascular: Atherosclerotic calcifications are noted. No aneurysmal dilatation or dissection is seen. No cardiac enlargement is noted. Heavy coronary calcifications are seen. The pulmonary artery is well visualized bilaterally. No filling defects to suggest pulmonary embolism are noted. Mediastinum/Nodes: Thoracic inlet is within normal limits. Mediastinal shift to the right is noted secondary to volume loss related to the prior treatment. Left hilar lymph node is noted measuring up to 1.2 cm. A few smaller nodes are noted as well. No significant subcarinal adenopathy is seen. Soft tissue density is noted along the right paratracheal space which appears to extend through the  trachea. This is best seen on image number 102 of series 5. Generalized increased soft tissue density around the right mainstem bronchus is seen. The possibility of recurrent neoplasm could not be totally excluded. Lungs/Pleura: Volume loss is noted in the right upper lobe consistent with the prior therapy. Persistent and slightly increased consolidation in the right apex is noted. Scarring is noted in the lingula and left lower lobe posteriorly. No new focal infiltrate is seen. No parenchymal nodules are noted. Emphysematous changes seen. Chronic scarring in the right base is noted as well as a small pleural effusion slightly increased from the prior study. Upper Abdomen: Stable right adrenal lesion is noted measuring up to 2.2 cm. This demonstrates Hounsfield unit of -16 and is consistent with a stable adenoma. The remainder of the upper abdomen is within normal limits. Musculoskeletal: Degenerative changes of the thoracic spine are seen. No acute rib abnormality is noted. Review of the MIP images confirms the above findings. IMPRESSION: Progressive post radiation changes with increased volume loss  in the right apex. Some increase in soft tissue density surrounding the right mainstem bronchus is noted with findings suspicious for soft tissue ingrowth into the trachea through the tracheal wall. These changes are new from the prior exam. Further workup is recommended as this could represent progressive neoplasm. No evidence of pulmonary emboli. 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 Aortic Atherosclerosis (ICD10-I70.0). Electronically Signed   By: Inez Catalina M.D.   On: 11/24/2022 19:23   DG Chest Port 1 View  Result Date: 11/24/2022 CLINICAL DATA:  Sepsis EXAM: PORTABLE CHEST 1 VIEW COMPARISON:  05/02/2020 FINDINGS: Grossly stable cardiomediastinal contours with significant volume loss in the right hemithorax. Left lung is clear. Probable small right pleural effusion. No pneumothorax.  IMPRESSION: Chronic post treatment changes within the right hemithorax. No evidence of a superimposed acute cardiopulmonary process. Electronically Signed   By: Davina Poke D.O.   On: 11/24/2022 16:36    Microbiology: Results for orders placed or performed during the hospital encounter of 11/24/22  Culture, blood (Routine x 2)     Status: None   Collection Time: 11/24/22  4:46 PM   Specimen: BLOOD RIGHT FOREARM  Result Value Ref Range Status   Specimen Description BLOOD RIGHT FOREARM  Final   Special Requests   Final    BOTTLES DRAWN AEROBIC AND ANAEROBIC Blood Culture results may not be optimal due to an excessive volume of blood received in culture bottles   Culture   Final    NO GROWTH 5 DAYS Performed at [facility], [address] [place], [place] [address]    Report Status 11/29/2022 FINAL  Final  Culture, blood (Routine x 2)     Status: None   Collection Time: 11/24/22  4:46 PM   Specimen: Left Antecubital; Blood  Result Value Ref Range Status   Specimen Description LEFT ANTECUBITAL  Final   Special Requests   Final    BOTTLES DRAWN AEROBIC AND ANAEROBIC Blood Culture adequate volume   Culture   Final    NO GROWTH 5 DAYS Performed at [facility], [address] [place], [place] [address]    Report Status 11/29/2022 FINAL  Final  Anaerobic culture w Gram Stain     Status: None   Collection Time: 11/25/22  2:40 PM   Specimen: Bronchial Washing, Right; Lung  Result Value Ref Range Status   Specimen Description   Final    BRONCHIAL WASHINGS RESISTANT Performed at [facility] [address], [place], [place] [address]    Special Requests   Final    NONE Performed at [facility], [address], [place], [place] [address]    Gram Stain   Final    RARE WBC PRESENT, PREDOMINANTLY PMN NO ORGANISMS SEEN    Culture   Final    NO ANAEROBES ISOLATED Performed at [facility], [place] [address] [place], [place] [address]    Report Status 11/30/2022 FINAL  Final     Labs: CBC: No results for input(s): "WBC", "NEUTROABS", "HGB", "HCT", "MCV", "PLT" in the last 168 hours. Basic Metabolic Panel: Recent Labs  Lab 11/26/22 2234  GLUCOSE 385*   Liver Function Tests: No results for input(s): "AST", "ALT", "ALKPHOS", "BILITOT", "PROT", "ALBUMIN" in the last 168 hours. CBG: Recent Labs  Lab 11/27/22 1131 11/27/22 1621 11/27/22 2056 11/28/22 0724 11/28/22 1133  GLUCAP 320*  320* 293* 354* 444* 298*    Discharge time spent: greater than 30 minutes.  Signed: Roxan Hockey, MD [facility]  12/03/2022 

## 2022-12-03 NOTE — Patient Instructions (Addendum)
  X Nystatin 77ml swish & swallow x 151ml  Stay on trelegy  X Amb sat - based on this we will get you POC  X Ct chest w con in feb 2024

## 2022-12-03 NOTE — Progress Notes (Signed)
Subjective:    Patient ID: Daryl Vega, male    DOB: May 23, 1946, 76 y.o.   MRN: [id number]  HPI Posthospital visit  76 year old ex-smoker with history of COPD and lung cancer adm 12/11 with shortness of breath for about 1 month.  He was found to be hypoxic to 85% and placed on 2 L nasal cannula He was treated as an outpatient about 2 months ago for pneumonia initially with Augmentin and then with Levaquin. CT angiogram chest showed radiation changes at the right apex with soft tissue density around the right mainstem bronchus and invading into carina through tracheal wall.  This was new compared to his last CT from 04/2020.  Right adrenal lesion 2.2 cm was stable   PMH -  COPD -quit smoking 2015, more than 100 pack years Stage IIIb squamous cell lung cancer diagnosed 07/2014 when he presented with mass obstructing right upper lobe and extending into right tracheal wall, T3 N3 M0, involved right supraclavicular lymph nodes, underwent palliative radiation to right upper lobe with Dr. Randa Ngo and chemotherapy   -Area of right upper lobe consolidation evaluated by PET/CT 05/2020, no hypermetabolism   -Pulmonary embolism, discontinued apixaban 2018 -Diabetes type 2 -Chemotherapy-induced neuropathy   Chief Complaint  Patient presents with   Hospitalization Follow-up    HFU AP 12/11-12/15 resp failure/ lung mass/ SOB    Concern for recurrence of lung malignancy invading right mainstem bronchus and carina .  He underwent bronchoscopy which did not show any endobronchial or tracheal lesion.  Right mainstem bronchus was narrowed down extrinsically more due to radiation. He states that his breathing is improved he is back to chopping wood. Oxygen saturation was 91% on room air today did not meet criteria for starting oxygen at discharge. He complains of a sore throat, he is completing his course of prednisone.  He needs prescription for albuterol  On ambulation oxygen level dropped  from 93 to 85% and improved after 2 L of oxygen and maintained on walking.  Significant tests/ events reviewed  Ct chest 10/11 Right supraclavicular 11 mm node ,11 mm lower right paratracheal node  Soft tissue density in the right hilum, with severe narrowing of the right mainstem bronchus and variable narrowing of additional proximal bronchi. Irregular, medial consolidation in the right upper lobe, associated with bronchiectasis and architectural distortion. Small right pleural effusion and subpleural reticular fibrosis   Bronchoscopy 11/25/22 only showed thick mucus at the carina, no tracheal or endobronchial lesions. Significant narrowing of the right upper lobe bronchus which could not be traversed by bronchoscope, due to radiation scarring  BAL neg, brushings >. Atypical cells  Spirometry 10/2022 moderate airway obstruction, ratio 61, FEV1 1.39, FVC 2.29  Review of Systems neg for any significant sore throat, dysphagia, itching, sneezing, nasal congestion or excess/ purulent secretions, fever, chills, sweats, unintended wt loss, pleuritic or exertional cp, hempoptysis, orthopnea pnd or change in chronic leg swelling. Also denies presyncope, palpitations, heartburn, abdominal pain, nausea, vomiting, diarrhea or change in bowel or urinary habits, dysuria,hematuria, rash, arthralgias, visual complaints, headache, numbness weakness or ataxia.     Objective:   Physical Exam  Gen. Pleasant, well-nourished, in no distress ENT - no thrush, no pallor/icterus,no post nasal drip Neck: No JVD, no thyromegaly, no carotid bruits Lungs: no use of accessory muscles, no dullness to percussion, faint scattered rhonchi  Cardiovascular: Rhythm regular, heart sounds  normal, no murmurs or gallops, no peripheral edema Musculoskeletal: No deformities, no cyanosis or clubbing  Assessment & Plan:

## 2022-12-03 NOTE — Assessment & Plan Note (Signed)
Fortunately bronchoscopy did not show tracheal mass or endobronchial lesions.  Right mainstem has narrowed down significantly to a small opening which could not be traversed by bronchoscope.  This is likely related to radiation changes noted on his CT.  His lymphadenopathy has been stable.  We will obtain a follow-up CT chest with contrast in 3 months to remain vigilant for cancer recurrence He has follow-up with his oncologist tomorrow

## 2022-12-03 NOTE — Progress Notes (Signed)
Per [facility] needed for CT scan ordered for Feb 2024.

## 2023-01-05 ENCOUNTER — Telehealth: Payer: Self-pay

## 2023-01-05 NOTE — Telephone Encounter (Addendum)
-----  Message from Leane Para, CMA sent at 12/03/2022  2:20 PM EST ----- Regarding: lab reminder Call patient and remind to get BMET for CT scan since patient is diabetic. Needs before CT scan but no earlier than 01/03/23. #note: per pcc- patient was not sure if he wanted to do CT scan (to check lung mass)# CT scheduled for 02/02/23   Patient has an appt with Dr. Vassie Loll on 01/22/23. Will address patient needing BMET and CT scan at that time with Dr. Vassie Loll

## 2023-01-19 ENCOUNTER — Encounter (HOSPITAL_COMMUNITY): Payer: Self-pay | Admitting: Pulmonary Disease

## 2023-01-22 ENCOUNTER — Ambulatory Visit: Payer: Medicare Other | Admitting: Pulmonary Disease

## 2023-01-22 ENCOUNTER — Encounter: Payer: Self-pay | Admitting: Pulmonary Disease

## 2023-01-22 VITALS — BP 136/82 | HR 106 | Ht 68.0 in | Wt 162.0 lb

## 2023-01-22 DIAGNOSIS — J449 Chronic obstructive pulmonary disease, unspecified: Secondary | ICD-10-CM

## 2023-01-22 DIAGNOSIS — J9611 Chronic respiratory failure with hypoxia: Secondary | ICD-10-CM

## 2023-01-22 DIAGNOSIS — C3401 Malignant neoplasm of right main bronchus: Secondary | ICD-10-CM

## 2023-01-22 NOTE — Patient Instructions (Addendum)
Use oxygen while walking & during sleep  X refills on trelegy  X BMET today  Good luck with CT scan

## 2023-01-22 NOTE — Assessment & Plan Note (Signed)
Oxygen saturation was 90% on arrival today.  I encouraged him to use POC on exertion and use nocturnal oxygen

## 2023-01-22 NOTE — Assessment & Plan Note (Addendum)
CT chest without contrast has been scheduled in 2 weeks Will obtain BMET today Specifically would like to follow right upper lobe and ensure that there is no growth or malignancy in that area

## 2023-01-22 NOTE — Progress Notes (Signed)
Subjective:    Patient ID: Daryl Vega, male    DOB: January 20, 1946, 77 y.o.   MRN: [id number]  HPI  77 year old ex-smoker with history of COPD and lung cancer adm 12/11 with shortness of breath for about 1 month.  He was found to be hypoxic to 85% and placed on 2 L nasal cannula He was treated as an outpatient about 2 months ago for pneumonia initially with Augmentin and then with Levaquin. CT angiogram chest showed radiation changes at the right apex with soft tissue density around the right mainstem bronchus and invading into carina through tracheal wall.  This was new compared to his last CT from 04/2020.  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 Right mainstem bronchus was narrowed down extrinsically  ? due to radiation to a small opening which could not be traversed by bronchoscope.      PMH -  COPD -quit smoking 2015, more than 100 pack years Stage IIIb squamous cell lung cancer diagnosed 07/2014 when he presented with mass obstructing right upper lobe and extending into right tracheal wall, T3 N3 M0, involved right supraclavicular lymph nodes, s/p chemo 11/2014 ,underwent palliative radiation to right upper lobe with Dr. [name] 08/2014    -Area of right upper lobe consolidation evaluated by PET/CT 05/2020, no hypermetabolism   -Pulmonary embolism, discontinued apixaban 2018 -Diabetes type 2 -Chemotherapy-induced neuropathy -Cervical spondylosis without myelopathy 06/23/2016    Chief Complaint  Patient presents with   Follow-up    Pt f/u he does have increased SOB when moving around + wheezing. He states that he isn't using his O2 regularly.   OV post hosp 11/2022 POC Rx sent -he was unable to obtain this but admits that he is not using this as frequently.  He will sometimes use oxygen at night. His wife was on dialysis and is now under hospice care and he is understandably distressed about this. He feels that  Trelegy must be helping some. Reviewed oncology visit in December  Significant tests/ events reviewed  CT chest 10/11 Right supraclavicular 11 mm node ,11 mm lower right paratracheal node  Soft tissue density in the right hilum, with severe narrowing of the right mainstem bronchus and variable narrowing of additional proximal bronchi. Irregular, medial consolidation in the right upper lobe, associated with bronchiectasis and architectural distortion. Small right pleural effusion and subpleural reticular fibrosis    Bronchoscopy 11/25/22 only showed thick mucus at the carina, no tracheal or endobronchial lesions. Significant narrowing of the right upper lobe bronchus which could not be traversed by bronchoscope, due to radiation scarring  BAL neg, brushings >. Atypical cells   Spirometry 10/2022 moderate airway obstruction, ratio 61, FEV1 1.39, FVC 2.29  Review of Systems neg for any significant sore throat, dysphagia, itching, sneezing, nasal congestion or excess/ purulent secretions, fever, chills, sweats, unintended wt loss, pleuritic or exertional cp, hempoptysis, orthopnea pnd or change in chronic leg swelling. Also denies presyncope, palpitations, heartburn, abdominal pain, nausea, vomiting, diarrhea or change in bowel or urinary habits, dysuria,hematuria, rash, arthralgias, visual complaints, headache, numbness weakness or ataxia.     Objective:   Physical Exam  Gen. Pleasant, well-nourished, in no distress ENT - no thrush, no pallor/icterus,no post nasal drip Neck: No JVD, no thyromegaly, no carotid bruits Lungs: no use of accessory muscles, no dullness to percussion, clear without rales or rhonchi  Cardiovascular: Rhythm regular, heart sounds  normal, no murmurs or gallops, no peripheral  edema Musculoskeletal: No deformities, no cyanosis or clubbing        Assessment & Plan:

## 2023-01-22 NOTE — Assessment & Plan Note (Signed)
Continue Trelegy. Use albuterol for rescue. We discussed COPD action plan for flare

## 2023-01-23 LAB — BASIC METABOLIC PANEL
BUN/Creatinine Ratio: 15 (ref 10–24)
BUN: 15 mg/dL (ref 8–27)
CO2: 21 mmol/L (ref 20–29)
Calcium: 9.4 mg/dL (ref 8.6–10.2)
Chloride: 104 mmol/L (ref 96–106)
Creatinine, Ser: 0.97 mg/dL (ref 0.76–1.27)
Glucose: 91 mg/dL (ref 70–99)
Potassium: 4.6 mmol/L (ref 3.5–5.2)
Sodium: 141 mmol/L (ref 134–144)
eGFR: 81 mL/min/{1.73_m2} (ref >59)

## 2023-02-02 ENCOUNTER — Ambulatory Visit (HOSPITAL_COMMUNITY)
Admission: RE | Admit: 2023-02-02 | Discharge: 2023-02-02 | Disposition: A | Discharge status: Home / Self Care | Payer: Medicare Other | Source: Ambulatory Visit | Attending: Pulmonary Disease | Admitting: Pulmonary Disease

## 2023-02-02 DIAGNOSIS — C3401 Malignant neoplasm of right main bronchus: Secondary | ICD-10-CM | POA: Diagnosis not present

## 2023-02-02 MED ORDER — IOHEXOL 300 MG/ML  SOLN
75.0000 mL | Freq: Once | INTRAMUSCULAR | Status: AC | PRN
  Administered 2023-02-02: 75 mL via INTRAVENOUS

## 2023-02-03 ENCOUNTER — Telehealth (HOSPITAL_BASED_OUTPATIENT_CLINIC_OR_DEPARTMENT_OTHER): Payer: Self-pay | Admitting: Pulmonary Disease

## 2023-02-03 NOTE — Telephone Encounter (Signed)
ATC patient again.   Dr. Elsworth Soho, routing to you as an FYI that we could not get in touch with patient.

## 2023-02-03 NOTE — Telephone Encounter (Signed)
Rigoberto Noel, MD  Fritzi Mandes D, CMA Tried to call him with CT results but unable to reach. Please let him know that CT scan showed 2 enlarged lymph nodes, one of them 1.3 cm above right collarbone and we can consider biopsy of this.  Okay to make appointment with me if he has any questions or is hesitant to proceed  ATC patient at [phone] and did not get an answer.

## 2023-02-03 NOTE — Telephone Encounter (Signed)
Pt calling and states he recieved a phone call from [place]. Cannot see any recent encounters open. Pt had CT scan yesterday and results have been posted in his chart. Please advise and call pt if needed.

## 2023-02-08 ENCOUNTER — Emergency Department (HOSPITAL_COMMUNITY): Payer: Medicare Other

## 2023-02-08 ENCOUNTER — Other Ambulatory Visit: Payer: Self-pay

## 2023-02-08 ENCOUNTER — Inpatient Hospital Stay (HOSPITAL_COMMUNITY)
Admission: EM | Admit: 2023-02-08 | Discharge: 2023-02-16 | DRG: 871 | Discharge status: Against Medical Advice | Payer: Medicare Other | Attending: Family Medicine | Admitting: Family Medicine

## 2023-02-08 DIAGNOSIS — J189 Pneumonia, unspecified organism: Principal | ICD-10-CM | POA: Diagnosis present

## 2023-02-08 DIAGNOSIS — J9621 Acute and chronic respiratory failure with hypoxia: Secondary | ICD-10-CM | POA: Diagnosis present

## 2023-02-08 DIAGNOSIS — C3490 Malignant neoplasm of unspecified part of unspecified bronchus or lung: Secondary | ICD-10-CM | POA: Diagnosis present

## 2023-02-08 DIAGNOSIS — Z86711 Personal history of pulmonary embolism: Secondary | ICD-10-CM

## 2023-02-08 DIAGNOSIS — J14 Pneumonia due to Hemophilus influenzae: Secondary | ICD-10-CM | POA: Diagnosis present

## 2023-02-08 DIAGNOSIS — Z1152 Encounter for screening for COVID-19: Secondary | ICD-10-CM | POA: Diagnosis not present

## 2023-02-08 DIAGNOSIS — E440 Moderate protein-calorie malnutrition: Secondary | ICD-10-CM | POA: Diagnosis present

## 2023-02-08 DIAGNOSIS — J432 Centrilobular emphysema: Secondary | ICD-10-CM | POA: Diagnosis not present

## 2023-02-08 DIAGNOSIS — R6520 Severe sepsis without septic shock: Secondary | ICD-10-CM | POA: Diagnosis present

## 2023-02-08 DIAGNOSIS — Z9981 Dependence on supplemental oxygen: Secondary | ICD-10-CM

## 2023-02-08 DIAGNOSIS — D638 Anemia in other chronic diseases classified elsewhere: Secondary | ICD-10-CM | POA: Diagnosis present

## 2023-02-08 DIAGNOSIS — Z634 Disappearance and death of family member: Secondary | ICD-10-CM

## 2023-02-08 DIAGNOSIS — A413 Sepsis due to Hemophilus influenzae: Secondary | ICD-10-CM | POA: Diagnosis present

## 2023-02-08 DIAGNOSIS — R7881 Bacteremia: Secondary | ICD-10-CM | POA: Diagnosis not present

## 2023-02-08 DIAGNOSIS — Z515 Encounter for palliative care: Secondary | ICD-10-CM | POA: Diagnosis not present

## 2023-02-08 DIAGNOSIS — T380X5A Adverse effect of glucocorticoids and synthetic analogues, initial encounter: Secondary | ICD-10-CM | POA: Diagnosis not present

## 2023-02-08 DIAGNOSIS — J449 Chronic obstructive pulmonary disease, unspecified: Secondary | ICD-10-CM | POA: Diagnosis not present

## 2023-02-08 DIAGNOSIS — E1165 Type 2 diabetes mellitus with hyperglycemia: Secondary | ICD-10-CM | POA: Diagnosis not present

## 2023-02-08 DIAGNOSIS — E1169 Type 2 diabetes mellitus with other specified complication: Secondary | ICD-10-CM | POA: Diagnosis present

## 2023-02-08 DIAGNOSIS — D630 Anemia in neoplastic disease: Secondary | ICD-10-CM | POA: Diagnosis present

## 2023-02-08 DIAGNOSIS — E1143 Type 2 diabetes mellitus with diabetic autonomic (poly)neuropathy: Secondary | ICD-10-CM | POA: Diagnosis present

## 2023-02-08 DIAGNOSIS — A0811 Acute gastroenteropathy due to Norwalk agent: Secondary | ICD-10-CM | POA: Diagnosis present

## 2023-02-08 DIAGNOSIS — Z9049 Acquired absence of other specified parts of digestive tract: Secondary | ICD-10-CM

## 2023-02-08 DIAGNOSIS — T17990A Other foreign object in respiratory tract, part unspecified in causing asphyxiation, initial encounter: Secondary | ICD-10-CM | POA: Diagnosis not present

## 2023-02-08 DIAGNOSIS — Z8249 Family history of ischemic heart disease and other diseases of the circulatory system: Secondary | ICD-10-CM | POA: Diagnosis not present

## 2023-02-08 DIAGNOSIS — Z79899 Other long term (current) drug therapy: Secondary | ICD-10-CM

## 2023-02-08 DIAGNOSIS — E7800 Pure hypercholesterolemia, unspecified: Secondary | ICD-10-CM | POA: Diagnosis present

## 2023-02-08 DIAGNOSIS — E1142 Type 2 diabetes mellitus with diabetic polyneuropathy: Secondary | ICD-10-CM | POA: Diagnosis present

## 2023-02-08 DIAGNOSIS — A419 Sepsis, unspecified organism: Secondary | ICD-10-CM | POA: Diagnosis present

## 2023-02-08 DIAGNOSIS — R64 Cachexia: Secondary | ICD-10-CM | POA: Diagnosis present

## 2023-02-08 DIAGNOSIS — W44F9XA Other object of natural or organic material, entering into or through a natural orifice, initial encounter: Secondary | ICD-10-CM | POA: Diagnosis not present

## 2023-02-08 DIAGNOSIS — Z923 Personal history of irradiation: Secondary | ICD-10-CM

## 2023-02-08 DIAGNOSIS — J441 Chronic obstructive pulmonary disease with (acute) exacerbation: Secondary | ICD-10-CM | POA: Diagnosis present

## 2023-02-08 DIAGNOSIS — E872 Acidosis, unspecified: Secondary | ICD-10-CM | POA: Diagnosis present

## 2023-02-08 DIAGNOSIS — Z85118 Personal history of other malignant neoplasm of bronchus and lung: Secondary | ICD-10-CM

## 2023-02-08 DIAGNOSIS — I2699 Other pulmonary embolism without acute cor pulmonale: Secondary | ICD-10-CM | POA: Diagnosis present

## 2023-02-08 DIAGNOSIS — J90 Pleural effusion, not elsewhere classified: Secondary | ICD-10-CM | POA: Diagnosis present

## 2023-02-08 DIAGNOSIS — J440 Chronic obstructive pulmonary disease with acute lower respiratory infection: Secondary | ICD-10-CM | POA: Diagnosis present

## 2023-02-08 DIAGNOSIS — R591 Generalized enlarged lymph nodes: Secondary | ICD-10-CM | POA: Diagnosis present

## 2023-02-08 DIAGNOSIS — R531 Weakness: Secondary | ICD-10-CM | POA: Diagnosis present

## 2023-02-08 DIAGNOSIS — Z87891 Personal history of nicotine dependence: Secondary | ICD-10-CM

## 2023-02-08 DIAGNOSIS — Z66 Do not resuscitate: Secondary | ICD-10-CM | POA: Diagnosis present

## 2023-02-08 DIAGNOSIS — Z6824 Body mass index (BMI) 24.0-24.9, adult: Secondary | ICD-10-CM

## 2023-02-08 DIAGNOSIS — Z5329 Procedure and treatment not carried out because of patient's decision for other reasons: Secondary | ICD-10-CM | POA: Diagnosis not present

## 2023-02-08 DIAGNOSIS — Z7984 Long term (current) use of oral hypoglycemic drugs: Secondary | ICD-10-CM

## 2023-02-08 DIAGNOSIS — Y92239 Unspecified place in hospital as the place of occurrence of the external cause: Secondary | ICD-10-CM | POA: Diagnosis not present

## 2023-02-08 DIAGNOSIS — Z9221 Personal history of antineoplastic chemotherapy: Secondary | ICD-10-CM

## 2023-02-08 DIAGNOSIS — Z8711 Personal history of peptic ulcer disease: Secondary | ICD-10-CM

## 2023-02-08 DIAGNOSIS — T17998A Other foreign object in respiratory tract, part unspecified causing other injury, initial encounter: Secondary | ICD-10-CM | POA: Diagnosis not present

## 2023-02-08 DIAGNOSIS — Z85820 Personal history of malignant melanoma of skin: Secondary | ICD-10-CM

## 2023-02-08 DIAGNOSIS — E785 Hyperlipidemia, unspecified: Secondary | ICD-10-CM | POA: Diagnosis present

## 2023-02-08 LAB — COMPREHENSIVE METABOLIC PANEL
ALT: 39 U/L (ref 0–44)
AST: 59 U/L — ABNORMAL HIGH (ref 15–41)
Albumin: 3.1 g/dL — ABNORMAL LOW (ref 3.5–5.0)
Alkaline Phosphatase: 146 U/L — ABNORMAL HIGH (ref 38–126)
Anion gap: 7 (ref 5–15)
BUN: 18 mg/dL (ref 8–23)
CO2: 20 mmol/L — ABNORMAL LOW (ref 22–32)
Calcium: 8.6 mg/dL — ABNORMAL LOW (ref 8.9–10.3)
Chloride: 110 mmol/L (ref 98–111)
Creatinine, Ser: 1.01 mg/dL (ref 0.61–1.24)
GFR, Estimated: >60 mL/min (ref >60)
Glucose, Bld: 124 mg/dL — ABNORMAL HIGH (ref 70–99)
Potassium: 3.7 mmol/L (ref 3.5–5.1)
Sodium: 137 mmol/L (ref 135–145)
Total Bilirubin: 1.2 mg/dL (ref 0.3–1.2)
Total Protein: 6.5 g/dL (ref 6.5–8.1)

## 2023-02-08 LAB — CBC WITH DIFFERENTIAL/PLATELET
Abs Immature Granulocytes: 0.07 10*3/uL (ref 0.00–0.07)
Basophils Absolute: 0 10*3/uL (ref 0.0–0.1)
Basophils Relative: 0 %
Eosinophils Absolute: 0 10*3/uL (ref 0.0–0.5)
Eosinophils Relative: 0 %
HCT: 36 % — ABNORMAL LOW (ref 39.0–52.0)
Hemoglobin: 11.8 g/dL — ABNORMAL LOW (ref 13.0–17.0)
Immature Granulocytes: 1 %
Lymphocytes Relative: 8 %
Lymphs Abs: 1 10*3/uL (ref 0.7–4.0)
MCH: 29.1 pg (ref 26.0–34.0)
MCHC: 32.8 g/dL (ref 30.0–36.0)
MCV: 88.7 fL (ref 80.0–100.0)
Monocytes Absolute: 1.2 10*3/uL — ABNORMAL HIGH (ref 0.1–1.0)
Monocytes Relative: 9 %
Neutro Abs: 10.9 10*3/uL — ABNORMAL HIGH (ref 1.7–7.7)
Neutrophils Relative %: 82 %
Platelets: 211 10*3/uL (ref 150–400)
RBC: 4.06 MIL/uL — ABNORMAL LOW (ref 4.22–5.81)
RDW: 17.3 % — ABNORMAL HIGH (ref 11.5–15.5)
WBC: 13.2 10*3/uL — ABNORMAL HIGH (ref 4.0–10.5)
nRBC: 0 % (ref 0.0–0.2)

## 2023-02-08 LAB — URINALYSIS, ROUTINE W REFLEX MICROSCOPIC
Bilirubin Urine: NEGATIVE
Glucose, UA: NEGATIVE mg/dL
Hgb urine dipstick: NEGATIVE
Ketones, ur: 5 mg/dL — AB
Leukocytes,Ua: NEGATIVE
Nitrite: NEGATIVE
Protein, ur: >=300 mg/dL — AB
Specific Gravity, Urine: 1.026 (ref 1.005–1.030)
pH: 5 (ref 5.0–8.0)

## 2023-02-08 LAB — CBG MONITORING, ED
Glucose-Capillary: 124 mg/dL — ABNORMAL HIGH (ref 70–99)
Glucose-Capillary: 90 mg/dL (ref 70–99)
Glucose-Capillary: 85 mg/dL (ref 70–99)
Glucose-Capillary: 105 mg/dL — ABNORMAL HIGH (ref 70–99)

## 2023-02-08 LAB — IRON AND TIBC
Iron: 19 ug/dL — ABNORMAL LOW (ref 45–182)
Saturation Ratios: 6 % — ABNORMAL LOW (ref 17.9–39.5)
TIBC: 318 ug/dL (ref 250–450)
UIBC: 299 ug/dL

## 2023-02-08 LAB — RESP PANEL BY RT-PCR (RSV, FLU A&B, COVID)  RVPGX2
Influenza A by PCR: NEGATIVE
Influenza B by PCR: NEGATIVE
Resp Syncytial Virus by PCR: NEGATIVE
SARS Coronavirus 2 by RT PCR: NEGATIVE

## 2023-02-08 LAB — RETICULOCYTES
Immature Retic Fract: 22.4 % — ABNORMAL HIGH (ref 2.3–15.9)
RBC.: 4.02 MIL/uL — ABNORMAL LOW (ref 4.22–5.81)
Retic Count, Absolute: 72.8 10*3/uL (ref 19.0–186.0)
Retic Ct Pct: 1.8 % (ref 0.4–3.1)

## 2023-02-08 LAB — GLUCOSE, CAPILLARY: Glucose-Capillary: 99 mg/dL (ref 70–99)

## 2023-02-08 LAB — LACTIC ACID, PLASMA
Lactic Acid, Venous: 1.2 mmol/L (ref 0.5–1.9)
Lactic Acid, Venous: 2 mmol/L (ref 0.5–1.9)

## 2023-02-08 LAB — LIPASE, BLOOD: Lipase: 35 U/L (ref 11–51)

## 2023-02-08 LAB — FOLATE: Folate: 13.6 ng/mL (ref >5.9)

## 2023-02-08 LAB — FERRITIN: Ferritin: 170 ng/mL (ref 24–336)

## 2023-02-08 LAB — VITAMIN B12: Vitamin B-12: 152 pg/mL — ABNORMAL LOW (ref 180–914)

## 2023-02-08 MED ORDER — IPRATROPIUM-ALBUTEROL 0.5-2.5 (3) MG/3ML IN SOLN
3.0000 mL | Freq: Four times a day (QID) | RESPIRATORY_TRACT | Status: DC
  Administered 2023-02-08 – 2023-02-09 (×4): 3 mL via RESPIRATORY_TRACT
  Filled 2023-02-08 (×5): qty 3

## 2023-02-08 MED ORDER — CHLORHEXIDINE GLUCONATE CLOTH 2 % EX PADS: 6.0000 | MEDICATED_PAD | Freq: Every day | CUTANEOUS | Status: DC

## 2023-02-08 MED ORDER — IPRATROPIUM-ALBUTEROL 0.5-2.5 (3) MG/3ML IN SOLN
9.0000 mL | Freq: Once | RESPIRATORY_TRACT | Status: AC
  Administered 2023-02-08: 9 mL via RESPIRATORY_TRACT
  Filled 2023-02-08: qty 9

## 2023-02-08 MED ORDER — MIRTAZAPINE 15 MG PO TABS
7.5000 mg | ORAL_TABLET | Freq: Every day | ORAL | Status: DC
  Administered 2023-02-08 – 2023-02-15 (×8): 7.5 mg via ORAL
  Filled 2023-02-08 (×8): qty 1

## 2023-02-08 MED ORDER — ACETAMINOPHEN 650 MG RE SUPP: 650.0000 mg | Freq: Four times a day (QID) | RECTAL | Status: DC | PRN

## 2023-02-08 MED ORDER — GUAIFENESIN-DM 100-10 MG/5ML PO SYRP
10.0000 mL | ORAL_SOLUTION | ORAL | Status: DC | PRN
  Filled 2023-02-08: qty 10

## 2023-02-08 MED ORDER — LACTATED RINGERS IV SOLN: INTRAVENOUS | Status: DC

## 2023-02-08 MED ORDER — INSULIN ASPART 100 UNIT/ML IJ SOLN
0.0000 [IU] | Freq: Every day | INTRAMUSCULAR | Status: DC
  Administered 2023-02-09: 4 [IU] via SUBCUTANEOUS
  Administered 2023-02-11: 5 [IU] via SUBCUTANEOUS
  Administered 2023-02-11: 2 [IU] via SUBCUTANEOUS

## 2023-02-08 MED ORDER — TRAMADOL HCL 50 MG PO TABS
50.0000 mg | ORAL_TABLET | Freq: Four times a day (QID) | ORAL | Status: DC | PRN
  Administered 2023-02-08 – 2023-02-16 (×19): 50 mg via ORAL
  Filled 2023-02-08 (×20): qty 1

## 2023-02-08 MED ORDER — ACETAMINOPHEN 325 MG PO TABS
650.0000 mg | ORAL_TABLET | Freq: Four times a day (QID) | ORAL | Status: DC | PRN
  Administered 2023-02-08 – 2023-02-15 (×5): 650 mg via ORAL
  Filled 2023-02-08 (×5): qty 2

## 2023-02-08 MED ORDER — CHLORHEXIDINE GLUCONATE CLOTH 2 % EX PADS
6.0000 | MEDICATED_PAD | Freq: Every day | CUTANEOUS | Status: DC
  Administered 2023-02-10 – 2023-02-14 (×4): 6 via TOPICAL

## 2023-02-08 MED ORDER — CLOTRIMAZOLE 1 % EX CREA: TOPICAL_CREAM | Freq: Two times a day (BID) | CUTANEOUS | Status: DC | PRN

## 2023-02-08 MED ORDER — SODIUM CHLORIDE 0.9 % IV SOLN: 250.0000 mL | INTRAVENOUS | Status: DC | PRN

## 2023-02-08 MED ORDER — INSULIN ASPART 100 UNIT/ML IJ SOLN
2.0000 [IU] | Freq: Three times a day (TID) | INTRAMUSCULAR | Status: DC
  Administered 2023-02-09 – 2023-02-10 (×3): 2 [IU] via SUBCUTANEOUS

## 2023-02-08 MED ORDER — INSULIN ASPART 100 UNIT/ML IJ SOLN
0.0000 [IU] | Freq: Three times a day (TID) | INTRAMUSCULAR | Status: DC
  Administered 2023-02-09: 2 [IU] via SUBCUTANEOUS
  Administered 2023-02-09: 3 [IU] via SUBCUTANEOUS
  Administered 2023-02-10: 7 [IU] via SUBCUTANEOUS
  Administered 2023-02-10: 9 [IU] via SUBCUTANEOUS

## 2023-02-08 MED ORDER — GABAPENTIN 300 MG PO CAPS
900.0000 mg | ORAL_CAPSULE | Freq: Three times a day (TID) | ORAL | Status: DC
  Administered 2023-02-08 – 2023-02-16 (×24): 900 mg via ORAL
  Filled 2023-02-08 (×23): qty 3

## 2023-02-08 MED ORDER — SODIUM CHLORIDE 0.9% FLUSH
3.0000 mL | Freq: Two times a day (BID) | INTRAVENOUS | Status: DC
  Administered 2023-02-08 – 2023-02-16 (×16): 3 mL via INTRAVENOUS

## 2023-02-08 MED ORDER — SODIUM CHLORIDE 0.9 % IV SOLN
500.0000 mg | Freq: Every day | INTRAVENOUS | Status: DC
  Administered 2023-02-09: 500 mg via INTRAVENOUS
  Filled 2023-02-08: qty 5

## 2023-02-08 MED ORDER — LORAZEPAM 2 MG/ML IJ SOLN
1.0000 mg | INTRAMUSCULAR | Status: DC | PRN
  Administered 2023-02-08: 1 mg via INTRAVENOUS
  Filled 2023-02-08: qty 1

## 2023-02-08 MED ORDER — SODIUM CHLORIDE 0.9% FLUSH: 3.0000 mL | INTRAVENOUS | Status: DC | PRN

## 2023-02-08 MED ORDER — IOHEXOL 300 MG/ML  SOLN
100.0000 mL | Freq: Once | INTRAMUSCULAR | Status: AC | PRN
  Administered 2023-02-08: 100 mL via INTRAVENOUS

## 2023-02-08 MED ORDER — LACTATED RINGERS IV SOLN: INTRAVENOUS | Status: AC

## 2023-02-08 MED ORDER — ONDANSETRON HCL 4 MG/2ML IJ SOLN
4.0000 mg | Freq: Four times a day (QID) | INTRAMUSCULAR | Status: DC | PRN
  Administered 2023-02-08: 4 mg via INTRAVENOUS
  Filled 2023-02-08: qty 2

## 2023-02-08 MED ORDER — LACTATED RINGERS IV BOLUS
1000.0000 mL | Freq: Once | INTRAVENOUS | Status: AC
  Administered 2023-02-08: 1000 mL via INTRAVENOUS

## 2023-02-08 MED ORDER — ROSUVASTATIN CALCIUM 20 MG PO TABS
20.0000 mg | ORAL_TABLET | Freq: Every day | ORAL | Status: DC
  Administered 2023-02-08 – 2023-02-15 (×8): 20 mg via ORAL
  Filled 2023-02-08 (×8): qty 1

## 2023-02-08 MED ORDER — SODIUM CHLORIDE 0.9 % IV SOLN
2.0000 g | Freq: Every day | INTRAVENOUS | Status: DC
  Administered 2023-02-08 – 2023-02-10 (×3): 2 g via INTRAVENOUS
  Filled 2023-02-08 (×3): qty 20

## 2023-02-08 MED ORDER — ENOXAPARIN SODIUM 40 MG/0.4ML IJ SOSY
40.0000 mg | PREFILLED_SYRINGE | INTRAMUSCULAR | Status: DC
  Administered 2023-02-08 – 2023-02-15 (×8): 40 mg via SUBCUTANEOUS
  Filled 2023-02-08 (×8): qty 0.4

## 2023-02-08 MED ORDER — LEVOFLOXACIN IN D5W 500 MG/100ML IV SOLN
500.0000 mg | Freq: Once | INTRAVENOUS | Status: AC
  Administered 2023-02-08: 500 mg via INTRAVENOUS
  Filled 2023-02-08: qty 100

## 2023-02-08 MED ORDER — ONDANSETRON HCL 4 MG PO TABS: 4.0000 mg | ORAL_TABLET | Freq: Four times a day (QID) | ORAL | Status: DC | PRN

## 2023-02-08 NOTE — ED Provider Notes (Incomplete)
[facility] Provider Note  CSN: [id number] Arrival date & time: 02/08/23 0630  Chief Complaint(s) Fever  HPI Daryl Vega is a 77 y.o. male ***   Past Medical History Past Medical History:  Diagnosis Date   Anemia    C. difficile enteritis    Cholecystoduodenal fistula    COPD (chronic obstructive pulmonary disease) ([place])    DM (diabetes mellitus) ([place])    Duodenal ulcer    Elevated LFTs    FH: chemotherapy    Gastroparesis 02/09/2015   Hypercholesteremia    Lung cancer ([place])    Melanoma ([place])    Pneumonia    Protein calorie malnutrition ([place])    Pulmonary embolus ([place])    Radiation 08/03/14-08/23/14   35 gray to right chest   Patient Active Problem List   Diagnosis Date Noted   Chronic respiratory failure with hypoxia ([place]) 12/03/2022   Aspiration pneumonia ([place]) 05/02/2020   Chest pain in adult 05/02/2020   Hallucination 09/27/2015   Antineoplastic chemotherapy induced anemia 09/26/2015   Anemia of chronic disease 09/26/2015   Dyslipidemia associated with type 2 diabetes mellitus ([place]) 09/26/2015   Type 2 diabetes, controlled, 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[place]) 09/26/2015   Polypharmacy 09/25/2015   Recurrent falls 09/25/2015   Pulmonary embolism ([place]) 01/06/2015   Enteritis due to Clostridium difficile 12/19/2014   Protein-calorie malnutrition, severe ([place]) 11/30/2014   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 11/30/2014   Bleeding duodenal ulcer s/p ex lap/oversew 11/30/2014 11/30/2014   Lung cancer ([place]) 07/28/2014   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 and BI 50% obst  07/20/2014   COPD mixed type ([place]) 06/28/2014   Home Medication(s) Prior to Admission medications   Medication Sig Start Date End Date Taking? Authorizing Provider  acetaminophen (TYLENOL) 325 MG tablet Take 2 tablets (650 mg total) by mouth every 6 (six) hours as needed for mild pain (or Fever  >/= 101). 10/01/15   Robbie Lis, MD  albuterol (PROVENTIL) (2.5 MG/3ML) 0.083% nebulizer solution Take 3 mLs (2.5 mg total) by nebulization every 4 (four) hours as needed for wheezing or shortness of breath. 12/03/22 12/03/23  Rigoberto Noel, MD  albuterol (VENTOLIN HFA) 108 (90 Base) MCG/ACT inhaler Inhale 2 puffs into the lungs every 4 (four) hours as needed. 11/28/22   Roxan Hockey, MD  clotrimazole-betamethasone (LOTRISONE) cream Apply 1 application topically 2 (two) times daily as needed (dry skin).  12/27/19   [provider]  gabapentin (NEURONTIN) 300 MG capsule Take 900 mg by mouth 3 (three) times daily.  03/30/20   [provider]  glipiZIDE (GLUCOTROL XL) 10 MG 24 hr tablet Take 1 tablet (10 mg total) by mouth daily with breakfast. 11/28/22   Roxan Hockey, MD  metFORMIN (GLUCOPHAGE) 1000 MG tablet Take 1 tablet (1,000 mg total) by mouth 2 (two) times daily with a meal. 11/28/22   Emokpae, Courage, MD  mirtazapine (REMERON) 7.5 MG tablet Take by mouth. 11/18/22   [provider]  Nebulizers (COMPRESSOR/NEBULIZER) MISC 1 Units by Does not apply route daily as needed. 11/28/22   Roxan Hockey, MD  nystatin (MYCOSTATIN) 100000 UNIT/ML suspension Take 5 mLs (500,000 Units total) by mouth 4 (four) times daily. 12/03/22   Rigoberto Noel, MD  rosuvastatin (CRESTOR) 20 MG tablet Take 20 mg by mouth at bedtime. 04/04/20   [provider]  temazepam (RESTORIL) 15 MG capsule Take by mouth. Patient not taking: Reported on  01/22/2023    [provider]  traMADol (ULTRAM) 50 MG tablet Take 50 mg by mouth every 6 (six) hours as needed for moderate pain or severe pain.  04/21/20   [provider]  TRELEGY ELLIPTA 100-62.5-25 MCG/ACT AEPB Inhale 1 puff into the lungs daily. 11/10/22   [provider]                                                                                                                                    Past  Surgical History Past Surgical History:  Procedure Laterality Date   CHOLECYSTECTOMY     duodenostomy tube     ESOPHAGOGASTRODUODENOSCOPY N/A 11/30/2014   Procedure: ESOPHAGOGASTRODUODENOSCOPY (EGD);  Surgeon: [name], MD;  Location: [facility];  Service: Endoscopy;  Laterality: N/A;   FLEXIBLE BRONCHOSCOPY Bilateral 11/25/2022   Procedure: FLEXIBLE BRONCHOSCOPY;  Surgeon: Rigoberto Noel, MD;  Location: [facility];  Service: Pulmonary;  Laterality: Bilateral;   JEJUNOSTOMY FEEDING TUBE     LAPAROTOMY N/A 11/30/2014   Procedure: EXPLORATORY LAPAROTOMY, PYLOROPLASTY, OVERSEWING OF POSTERIOR DUODENUM, DUODENOSTOMY, CHOLECYSTECTOMY, JEJEUNOSTOMY;  Surgeon: Jackolyn Confer, MD;  Location: [facility];  Service: General;  Laterality: N/A;   PYLOROPLASTY     VIDEO BRONCHOSCOPY Bilateral 07/20/2014   Procedure: VIDEO BRONCHOSCOPY WITHOUT FLUORO;  Surgeon: Tanda Rockers, MD;  Location: [facility];  Service: Cardiopulmonary;  Laterality: Bilateral;   Family History Family History  Problem Relation Age of Onset   Heart disease Mother    Cancer Mother        ? type    Social History Social History   Tobacco Use   Smoking status: Former    Packs/day: 2.00    Years: 50.00    Total pack years: 100.00    Types: Cigarettes, Cigars    Quit date: 06/12/2014    Years since quitting: 8.6   Smokeless tobacco: Former    Types: Chew    Quit date: 09/07/2012  Vaping Use   Vaping Use: Never used  Substance Use Topics   Alcohol use: No   Drug use: No   Allergies Patient has no known allergies.  Review of Systems Review of Systems *** Physical Exam Vital Signs  I have reviewed the triage vital signs BP 107/60   Pulse (!) 107   Temp 99.9 F (37.7 C) (Oral)   Resp (!) 26   Ht '5\' 8"'$  (1.727 m)   Wt 72.1 kg   SpO2 92%   BMI 24.18 kg/m  *** Physical Exam  ED Results and Treatments Labs (all labs ordered are listed, but only abnormal results are displayed) Labs Reviewed  CBG  MONITORING, ED - Abnormal; Notable for the following components:      Result Value   Glucose-Capillary 124 (*)    All other components within normal limits  RESP PANEL BY RT-PCR (RSV, FLU A&B, COVID)  RVPGX2  CULTURE, BLOOD (ROUTINE X  2)  CULTURE, BLOOD (ROUTINE X 2)  CBC WITH DIFFERENTIAL/PLATELET  COMPREHENSIVE METABOLIC PANEL  LIPASE, BLOOD  URINALYSIS, ROUTINE W REFLEX MICROSCOPIC  LACTIC ACID, PLASMA  LACTIC ACID, PLASMA                                                                                                                          Radiology No results found.  Pertinent labs & imaging results that were available during my care of the patient were reviewed by me and considered in my medical decision making (see MDM for details).  Medications Ordered in ED Medications  ipratropium-albuterol (DUONEB) 0.5-2.5 (3) MG/3ML nebulizer solution 9 mL (has no administration in time range)                                                                                                                                     Procedures Procedures  (including critical care time)  Medical Decision Making / ED Course   This patient presents to the ED for concern of ***, this involves an extensive number of treatment options, and is a complaint that carries with it a high risk of complications and morbidity.  The differential diagnosis includes ***  MDM: ***   Additional history obtained: -Additional history obtained from *** -External records from outside source obtained and reviewed including: Chart review including previous notes, labs, imaging, consultation notes   Lab Tests: -I ordered, reviewed, and interpreted labs.   The pertinent results include:   Labs Reviewed  CBG MONITORING, ED - Abnormal; Notable for the following components:      Result Value   Glucose-Capillary 124 (*)    All other components within normal limits  RESP PANEL BY RT-PCR (RSV, FLU A&B, COVID)   RVPGX2  CULTURE, BLOOD (ROUTINE X 2)  CULTURE, BLOOD (ROUTINE X 2)  CBC WITH DIFFERENTIAL/PLATELET  COMPREHENSIVE METABOLIC PANEL  LIPASE, BLOOD  URINALYSIS, ROUTINE W REFLEX MICROSCOPIC  LACTIC ACID, PLASMA  LACTIC ACID, PLASMA      EKG ***  EKG Interpretation  Date/Time:    Ventricular Rate:    PR Interval:    QRS Duration:   QT Interval:    QTC Calculation:   R Axis:     Text Interpretation:           Imaging Studies ordered: I ordered imaging studies including *** I independently visualized and interpreted imaging. I  agree with the radiologist interpretation   Medicines ordered and prescription drug management: Meds ordered this encounter  Medications   ipratropium-albuterol (DUONEB) 0.5-2.5 (3) MG/3ML nebulizer solution 9 mL    -I have reviewed the patients home medicines and have made adjustments as needed  Critical interventions ***  Consultations Obtained: I requested consultation with the ***,  and discussed lab and imaging findings as well as pertinent plan - they recommend: ***   Cardiac Monitoring: The patient was maintained on a cardiac monitor.  I personally viewed and interpreted the cardiac monitored which showed an underlying rhythm of: ***  Social Determinants of Health:  Factors impacting patients care include: ***   Reevaluation: After the interventions noted above, I reevaluated the patient and found that they have :{resolved/improved/worsened:23923::"improved"}  Co morbidities that complicate the patient evaluation  Past Medical History:  Diagnosis Date   Anemia    C. difficile enteritis    Cholecystoduodenal fistula    COPD (chronic obstructive pulmonary disease) ([facility])    DM (diabetes mellitus) ([place])    Duodenal ulcer    Elevated LFTs    FH: chemotherapy    Gastroparesis 02/09/2015   Hypercholesteremia    Lung cancer ([place])    Melanoma ([place])    Pneumonia    Protein calorie malnutrition ([place])    Pulmonary embolus ([place])     Radiation 08/03/14-08/23/14   35 gray to right chest      Dispostion: I considered admission for this patient, ***     Final Clinical Impression(s) / ED Diagnoses Final diagnoses:  None     '@PCDICTATION'$ @

## 2023-02-08 NOTE — ED Triage Notes (Signed)
Pt BIB [facility], c/o NV/D for x3 days, fever of 100.3 per EMS.   1000 mg of tylenol, 900 mL of NS given in route.

## 2023-02-08 NOTE — Progress Notes (Signed)
RT note: Pt. currently remains on HFNC([place])@ 8 lpm, weaned down from 11, able to complete full sentences/hold conversation, Rt to monitor.

## 2023-02-08 NOTE — ED Notes (Signed)
Pt attempted to ambulate to bathroom and oxygen decreased. Nonrebreather applied and RT called. Pt on nonrebreather. Pt c/o pain and tramadol given for hand pain. Pt continues to pull off non rebreather and needs redirection to keep it on.

## 2023-02-08 NOTE — ED Provider Notes (Signed)
[facility] Provider Note  CSN: [id number] Arrival date & time: 02/08/23 0630  Chief Complaint(s) Fever  HPI Daryl Vega is a 77 y.o. male with PMH CHF, COPD on 2 L intermittently, lung cancer in remission, previous PE no longer on anticoagulation who presents emergency department for evaluation of shortness of breath, diarrhea and fever.  Patient states that he has been getting progressively sicker since the death of his wife 3 days ago.  He states that he has been having difficulty breathing and copious watery diarrhea.  He has been complaining of chills and rigors and his neighbor found him to be febrile to 103.0 at home.  Took Tylenol prior to arrival and 900 cc of fluid given in route by EMS.  Currently, patient endorses mild shortness of breath but denies chest pain, abdominal pain, headache, vomiting or other systemic symptoms.   Past Medical History Past Medical History:  Diagnosis Date   Anemia    C. difficile enteritis    Cholecystoduodenal fistula    COPD (chronic obstructive pulmonary disease) (HCC)    DM (diabetes mellitus) (HCC)    Duodenal ulcer    Elevated LFTs    FH: chemotherapy    Gastroparesis 02/09/2015   Hypercholesteremia    Lung cancer ([place])    Melanoma ([place])    Pneumonia    Protein calorie malnutrition ([place])    Pulmonary embolus ([place])    Radiation 08/03/14-08/23/14   35 gray to right chest   Patient Active Problem List   Diagnosis Date Noted   Chronic respiratory failure with hypoxia ([place]) 12/03/2022   Aspiration pneumonia ([place]) 05/02/2020   Chest pain in adult 05/02/2020   Hallucination 09/27/2015   Antineoplastic chemotherapy induced anemia 09/26/2015   Anemia of chronic disease 09/26/2015   Dyslipidemia associated with type 2 diabetes mellitus ([place]) 09/26/2015   Type 2 diabetes, controlled, 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[place]) 09/26/2015   Polypharmacy 09/25/2015   Recurrent falls 09/25/2015    Pulmonary embolism ([place]) 01/06/2015   Enteritis due to Clostridium difficile 12/19/2014   Protein-calorie malnutrition, severe ([place]) 11/30/2014   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 11/30/2014   Bleeding duodenal ulcer s/p ex lap/oversew 11/30/2014 11/30/2014   Lung cancer ([place]) 07/28/2014   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 and BI 50% obst  07/20/2014   COPD mixed type ([place]) 06/28/2014   Home Medication(s) Prior to Admission medications   Medication Sig Start Date End Date Taking? Authorizing Provider  acetaminophen (TYLENOL) 325 MG tablet Take 2 tablets (650 mg total) by mouth every 6 (six) hours as needed for mild pain (or Fever >/= 101). 10/01/15   Robbie Lis, MD  albuterol (PROVENTIL) (2.5 MG/3ML) 0.083% nebulizer solution Take 3 mLs (2.5 mg total) by nebulization every 4 (four) hours as needed for wheezing or shortness of breath. 12/03/22 12/03/23  Rigoberto Noel, MD  albuterol (VENTOLIN HFA) 108 (90 Base) MCG/ACT inhaler Inhale 2 puffs into the lungs every 4 (four) hours as needed. 11/28/22   Roxan Hockey, MD  clotrimazole-betamethasone (LOTRISONE) cream Apply 1 application topically 2 (two) times daily as needed (dry skin).  12/27/19   [provider]  gabapentin (NEURONTIN) 300 MG capsule Take 900 mg by mouth 3 (three) times daily.  03/30/20   [provider]  glipiZIDE (GLUCOTROL XL) 10 MG 24 hr tablet Take 1 tablet (10 mg total) by mouth daily with breakfast. 11/28/22   Roxan Hockey, MD  metFORMIN (GLUCOPHAGE) 1000 MG tablet Take 1 tablet (1,000 mg total) by mouth 2 (two) times daily with a meal. 11/28/22   Emokpae, Courage, MD  mirtazapine (REMERON) 7.5 MG tablet Take by mouth. 11/18/22   [provider]  Nebulizers (COMPRESSOR/NEBULIZER) MISC 1 Units by Does not apply route daily as needed. 11/28/22   Roxan Hockey, MD  nystatin (MYCOSTATIN) 100000 UNIT/ML suspension Take 5 mLs (500,000 Units  total) by mouth 4 (four) times daily. 12/03/22   Rigoberto Noel, MD  rosuvastatin (CRESTOR) 20 MG tablet Take 20 mg by mouth at bedtime. 04/04/20   [provider]  temazepam (RESTORIL) 15 MG capsule Take by mouth. Patient not taking: Reported on 01/22/2023    [provider]  traMADol (ULTRAM) 50 MG tablet Take 50 mg by mouth every 6 (six) hours as needed for moderate pain or severe pain.  04/21/20   [provider]  TRELEGY ELLIPTA 100-62.5-25 MCG/ACT AEPB Inhale 1 puff into the lungs daily. 11/10/22   [provider]                                                                                                                                    Past Surgical History Past Surgical History:  Procedure Laterality Date   CHOLECYSTECTOMY     duodenostomy tube     ESOPHAGOGASTRODUODENOSCOPY N/A 11/30/2014   Procedure: ESOPHAGOGASTRODUODENOSCOPY (EGD);  Surgeon: [name], MD;  Location: [facility];  Service: Endoscopy;  Laterality: N/A;   FLEXIBLE BRONCHOSCOPY Bilateral 11/25/2022   Procedure: FLEXIBLE BRONCHOSCOPY;  Surgeon: Rigoberto Noel, MD;  Location: [facility];  Service: Pulmonary;  Laterality: Bilateral;   JEJUNOSTOMY FEEDING TUBE     LAPAROTOMY N/A 11/30/2014   Procedure: EXPLORATORY LAPAROTOMY, PYLOROPLASTY, OVERSEWING OF POSTERIOR DUODENUM, DUODENOSTOMY, CHOLECYSTECTOMY, JEJEUNOSTOMY;  Surgeon: Jackolyn Confer, MD;  Location: [facility];  Service: General;  Laterality: N/A;   PYLOROPLASTY     VIDEO BRONCHOSCOPY Bilateral 07/20/2014   Procedure: VIDEO BRONCHOSCOPY WITHOUT FLUORO;  Surgeon: Tanda Rockers, MD;  Location: [facility];  Service: Cardiopulmonary;  Laterality: Bilateral;   Family History Family History  Problem Relation Age of Onset   Heart disease Mother    Cancer Mother        ? type    Social History Social History   Tobacco Use   Smoking status: Former    Packs/day: 2.00    Years: 50.00    Total pack years: 100.00     Types: Cigarettes, Cigars    Quit date: 06/12/2014    Years since quitting: 8.6   Smokeless tobacco: Former    Types: Chew    Quit date: 09/07/2012  Vaping Use   Vaping Use: Never used  Substance Use Topics   Alcohol use: No   Drug use: No   Allergies Patient has no known allergies.  Review of Systems Review of Systems  Constitutional:  Positive for chills,  fatigue and fever.  Respiratory:  Positive for shortness of breath.   Gastrointestinal:  Positive for diarrhea.    Physical Exam Vital Signs  I have reviewed the triage vital signs BP 103/69   Pulse (!) 108   Temp 99.9 F (37.7 C) (Oral)   Resp (!) 26   Ht '5\' 8"'$  (1.727 m)   Wt 72.1 kg   SpO2 91%   BMI 24.18 kg/m   Physical Exam Constitutional:      General: He is not in acute distress.    Appearance: Normal appearance. He is ill-appearing.  HENT:     Head: Normocephalic and atraumatic.     Nose: No congestion or rhinorrhea.  Eyes:     General:        Right eye: No discharge.        Left eye: No discharge.     Extraocular Movements: Extraocular movements intact.     Pupils: Pupils are equal, round, and reactive to light.  Cardiovascular:     Rate and Rhythm: Regular rhythm. Tachycardia present.     Heart sounds: No murmur heard. Pulmonary:     Effort: No respiratory distress.     Breath sounds: Wheezing present. No rales.  Abdominal:     General: There is no distension.     Tenderness: There is no abdominal tenderness.  Musculoskeletal:        General: Normal range of motion.     Cervical back: Normal range of motion.  Skin:    General: Skin is warm and dry.  Neurological:     General: No focal deficit present.     Mental Status: He is alert.     ED Results and Treatments Labs (all labs ordered are listed, but only abnormal results are displayed) Labs Reviewed  CBC WITH DIFFERENTIAL/PLATELET - Abnormal; Notable for the following components:      Result Value   WBC 13.2 (*)    RBC 4.06 (*)     Hemoglobin 11.8 (*)    HCT 36.0 (*)    RDW 17.3 (*)    Neutro Abs 10.9 (*)    Monocytes Absolute 1.2 (*)    All other components within normal limits  CBG MONITORING, ED - Abnormal; Notable for the following components:   Glucose-Capillary 124 (*)    All other components within normal limits  RESP PANEL BY RT-PCR (RSV, FLU A&B, COVID)  RVPGX2  CULTURE, BLOOD (ROUTINE X 2)  CULTURE, BLOOD (ROUTINE X 2)  GASTROINTESTINAL PANEL BY PCR, STOOL (REPLACES STOOL CULTURE)  C DIFFICILE QUICK SCREEN W PCR REFLEX    COMPREHENSIVE METABOLIC PANEL  LIPASE, BLOOD  URINALYSIS, ROUTINE W REFLEX MICROSCOPIC  LACTIC ACID, PLASMA  LACTIC ACID, PLASMA                                                                                                                          Radiology No results found.  Pertinent labs & imaging results that  were available during my care of the patient were reviewed by me and considered in my medical decision making (see MDM for details).  Medications Ordered in ED Medications  ipratropium-albuterol (DUONEB) 0.5-2.5 (3) MG/3ML nebulizer solution 9 mL (has no administration in time range)  lactated ringers bolus 1,000 mL (has no administration in time range)                                                                                                                                     Procedures .Critical Care  Performed by: Teressa Lower, MD Authorized by: Teressa Lower, MD   Critical care provider statement:    Critical care time (minutes):  30   Critical care was necessary to treat or prevent imminent or life-threatening deterioration of the following conditions:  Sepsis and respiratory failure   Critical care was time spent personally by me on the following activities:  Development of treatment plan with patient or surrogate, discussions with consultants, evaluation of patient's response to treatment, examination of patient, ordering and review of  laboratory studies, ordering and review of radiographic studies, ordering and performing treatments and interventions, pulse oximetry, re-evaluation of patient's condition and review of old charts   (including critical care time)  Medical Decision Making / ED Course   This patient presents to the ED for concern of fever, diarrhea, shortness of breath, this involves an extensive number of treatment options, and is a complaint that carries with it a high risk of complications and morbidity.  The differential diagnosis includes viral illness, pneumonia, sepsis, COPD exacerbation, C. difficile colitis, gastroenteritis  MDM: Patient seen emergency room for evaluation of multiple complaints described above.  Physical exam reveals an ill-appearing tachycardic patient with tachypnea on home 2 L nasal cannula.  Wheezing heard bilaterally.  Patient meet SIRS criteria on arrival and septic workup begun.  At time of signout, patient pending laboratory evaluation and imaging studies.  Please see provider signout for continuation of workup.   Additional history obtained:  -External records from outside source obtained and reviewed including: Chart review including previous notes, labs, imaging, consultation notes   Lab Tests: -I ordered, reviewed, and interpreted labs.   The pertinent results include:   Labs Reviewed  CBC WITH DIFFERENTIAL/PLATELET - Abnormal; Notable for the following components:      Result Value   WBC 13.2 (*)    RBC 4.06 (*)    Hemoglobin 11.8 (*)    HCT 36.0 (*)    RDW 17.3 (*)    Neutro Abs 10.9 (*)    Monocytes Absolute 1.2 (*)    All other components within normal limits  CBG MONITORING, ED - Abnormal; Notable for the following components:   Glucose-Capillary 124 (*)    All other components within normal limits  RESP PANEL BY RT-PCR (RSV, FLU A&B, COVID)  RVPGX2  CULTURE, BLOOD (ROUTINE X 2)  CULTURE, BLOOD (ROUTINE X 2)  GASTROINTESTINAL PANEL BY PCR, STOOL  (REPLACES STOOL CULTURE)  C DIFFICILE QUICK SCREEN W PCR REFLEX    COMPREHENSIVE METABOLIC PANEL  LIPASE, BLOOD  URINALYSIS, ROUTINE W REFLEX MICROSCOPIC  LACTIC ACID, PLASMA  LACTIC ACID, PLASMA      Imaging Studies ordered: I ordered imaging studies including chest x-ray, CT chest abdomen pelvis and these are pending   Medicines ordered and prescription drug management: Meds ordered this encounter  Medications   ipratropium-albuterol (DUONEB) 0.5-2.5 (3) MG/3ML nebulizer solution 9 mL   lactated ringers bolus 1,000 mL    -I have reviewed the patients home medicines and have made adjustments as needed  Critical interventions Oxygen supplementation, IVF, multiple DuoNebs    Cardiac Monitoring: The patient was maintained on a cardiac monitor.  I personally viewed and interpreted the cardiac monitored which showed an underlying rhythm of: Sinus tachycardia  Social Determinants of Health:  Factors impacting patients care include: Patient's wife recently passed away 3 days ago   Reevaluation: After the interventions noted above, I reevaluated the patient and found that they have :improved  Co morbidities that complicate the patient evaluation  Past Medical History:  Diagnosis Date   Anemia    C. difficile enteritis    Cholecystoduodenal fistula    COPD (chronic obstructive pulmonary disease) ([place])    DM (diabetes mellitus) ([place])    Duodenal ulcer    Elevated LFTs    FH: chemotherapy    Gastroparesis 02/09/2015   Hypercholesteremia    Lung cancer ([place])    Melanoma ([place])    Pneumonia    Protein calorie malnutrition ([place])    Pulmonary embolus ([place])    Radiation 08/03/14-08/23/14   35 gray to right chest      Dispostion: I considered admission for this patient, and disposition pending laboratory evaluation and imaging studies.  Please see provider signout for continuation of workup     Final Clinical Impression(s) / ED Diagnoses Final diagnoses:  None      '@PCDICTATION'$ @    Teressa Lower, MD 02/08/23 [phone]

## 2023-02-08 NOTE — H&P (Signed)
History and Physical    Daryl Vega [facility] [id number] DOB: 1946/07/23 DOA: 02/08/2023  PCP: Nelly Laurence, NP   Patient coming from: Home  Chief Complaint: Fevers and chills  HPI: Daryl Vega is a 77 y.o. 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  He apparently has been getting progressively more ill since the death of his wife 3 days ago and has been having difficulty breathing with copious watery diarrhea.  He denies any recent antibiotic use.  His neighbor found him to be febrile with temperature 103 Fahrenheit at home.  He denies any chest pain, lower extremity edema, abdominal pain, nausea, or vomiting.   ED Course: Vital signs with temperature 99.9 F and pulse of 113 and respirations of 27.  Leukocytosis 13,200 and hemoglobin 11.8.  Patient given 1 L fluid bolus and started on some gentle IV fluid as well as Levaquin.  He was given breathing treatment as well.  Currently on 4 L nasal cannula oxygen  Review of Systems: Reviewed as noted above, otherwise negative.  Past Medical History:  Diagnosis Date   Anemia    C. difficile enteritis    Cholecystoduodenal fistula    COPD (chronic obstructive pulmonary disease) (HCC)    DM (diabetes mellitus) (HCC)    Duodenal ulcer    Elevated LFTs    FH: chemotherapy    Gastroparesis 02/09/2015   Hypercholesteremia    Lung cancer ([place])    Melanoma ([place])    Pneumonia    Protein calorie malnutrition ([place])    Pulmonary embolus ([place])    Radiation 08/03/14-08/23/14   35 gray to right chest    Past Surgical History:  Procedure Laterality Date   CHOLECYSTECTOMY     duodenostomy tube     ESOPHAGOGASTRODUODENOSCOPY N/A 11/30/2014   Procedure: ESOPHAGOGASTRODUODENOSCOPY (EGD);  Surgeon: [name],  MD;  Location: [facility];  Service: Endoscopy;  Laterality: N/A;   FLEXIBLE BRONCHOSCOPY Bilateral 11/25/2022   Procedure: FLEXIBLE BRONCHOSCOPY;  Surgeon: Rigoberto Noel, MD;  Location: [facility];  Service: Pulmonary;  Laterality: Bilateral;   JEJUNOSTOMY FEEDING TUBE     LAPAROTOMY N/A 11/30/2014   Procedure: EXPLORATORY LAPAROTOMY, PYLOROPLASTY, OVERSEWING OF POSTERIOR DUODENUM, DUODENOSTOMY, CHOLECYSTECTOMY, JEJEUNOSTOMY;  Surgeon: Jackolyn Confer, MD;  Location: [facility];  Service: General;  Laterality: N/A;   PYLOROPLASTY     VIDEO BRONCHOSCOPY Bilateral 07/20/2014   Procedure: VIDEO BRONCHOSCOPY WITHOUT FLUORO;  Surgeon: Tanda Rockers, MD;  Location: [facility];  Service: Cardiopulmonary;  Laterality: Bilateral;     reports that he quit smoking about 8 years ago. His smoking use included cigarettes and cigars. He has a 100.00 pack-year smoking history. He quit smokeless tobacco use about 10 years ago.  His smokeless tobacco use included chew. He reports that he does not drink alcohol and does not use drugs.  No Known Allergies  Family History  Problem Relation Age of Onset   Heart disease Mother    Cancer Mother        ? type    Prior to Admission medications   Medication Sig Start Date End Date Taking? Authorizing Provider  acetaminophen (TYLENOL) 325 MG tablet Take 2 tablets (650 mg total) by mouth every 6 (six) hours as needed for mild pain (or Fever >/= 101). 10/01/15   Charlies Silvers,  Link Snuffer, MD  albuterol (PROVENTIL) (2.5 MG/3ML) 0.083% nebulizer solution Take 3 mLs (2.5 mg total) by nebulization every 4 (four) hours as needed for wheezing or shortness of breath. 12/03/22 12/03/23  Rigoberto Noel, MD  albuterol (VENTOLIN HFA) 108 (90 Base) MCG/ACT inhaler Inhale 2 puffs into the lungs every 4 (four) hours as needed. 11/28/22   Roxan Hockey, MD  clotrimazole-betamethasone (LOTRISONE) cream Apply 1 application topically 2 (two) times daily as needed (dry skin).  12/27/19    [provider]  gabapentin (NEURONTIN) 300 MG capsule Take 900 mg by mouth 3 (three) times daily.  03/30/20   [provider]  glipiZIDE (GLUCOTROL XL) 10 MG 24 hr tablet Take 1 tablet (10 mg total) by mouth daily with breakfast. 11/28/22   Roxan Hockey, MD  metFORMIN (GLUCOPHAGE) 1000 MG tablet Take 1 tablet (1,000 mg total) by mouth 2 (two) times daily with a meal. 11/28/22   Emokpae, Courage, MD  mirtazapine (REMERON) 7.5 MG tablet Take by mouth. 11/18/22   [provider]  Nebulizers (COMPRESSOR/NEBULIZER) MISC 1 Units by Does not apply route daily as needed. 11/28/22   Roxan Hockey, MD  nystatin (MYCOSTATIN) 100000 UNIT/ML suspension Take 5 mLs (500,000 Units total) by mouth 4 (four) times daily. 12/03/22   Rigoberto Noel, MD  rosuvastatin (CRESTOR) 20 MG tablet Take 20 mg by mouth at bedtime. 04/04/20   [provider]  temazepam (RESTORIL) 15 MG capsule Take by mouth. Patient not taking: Reported on 01/22/2023    [provider]  traMADol (ULTRAM) 50 MG tablet Take 50 mg by mouth every 6 (six) hours as needed for moderate pain or severe pain.  04/21/20   [provider]  TRELEGY ELLIPTA 100-62.5-25 MCG/ACT AEPB Inhale 1 puff into the lungs daily. 11/10/22   [provider]    Physical Exam: Vitals:   02/08/23 0745 02/08/23 0812 02/08/23 0815 02/08/23 0900  BP:    122/62  Pulse: (!) 103  98 (!) 113  Resp: (!) 25  (!) 29 (!) 27  Temp:      TempSrc:      SpO2: 95% 92% 90% 95%  Weight:      Height:        Constitutional: NAD, calm, comfortable Vitals:   02/08/23 0745 02/08/23 0812 02/08/23 0815 02/08/23 0900  BP:    122/62  Pulse: (!) 103  98 (!) 113  Resp: (!) 25  (!) 29 (!) 27  Temp:      TempSrc:      SpO2: 95% 92% 90% 95%  Weight:      Height:       Eyes: lids and conjunctivae normal Neck: normal, supple Respiratory: clear to auscultation bilaterally. Normal respiratory effort. No accessory muscle use.  4  L nasal cannula Cardiovascular: Regular rate and rhythm, no murmurs. Abdomen: no tenderness, no distention. Bowel sounds positive.  Musculoskeletal:  No edema. Skin: no rashes, lesions, ulcers.  Psychiatric: Flat affect  Labs on Admission: I have personally reviewed following labs and imaging studies  CBC: Recent Labs  Lab 02/08/23 0639  WBC 13.2*  NEUTROABS 10.9*  HGB 11.8*  HCT 36.0*  MCV 88.7  PLT 123456   Basic Metabolic Panel: Recent Labs  Lab 02/08/23 0639  NA 137  K 3.7  CL 110  CO2 20*  GLUCOSE 124*  BUN 18  CREATININE 1.01  CALCIUM 8.6*   GFR: Estimated Creatinine Clearance: 61.1 mL/min (by C-G formula based on SCr of 1.01  mg/dL). Liver Function Tests: Recent Labs  Lab 02/08/23 0639  AST 59*  ALT 39  ALKPHOS 146*  BILITOT 1.2  PROT 6.5  ALBUMIN 3.1*   Recent Labs  Lab 02/08/23 0639  LIPASE 35   No results for input(s): "AMMONIA" in the last 168 hours. Coagulation Profile: No results for input(s): "INR", "PROTIME" in the last 168 hours. Cardiac Enzymes: No results for input(s): "CKTOTAL", "CKMB", "CKMBINDEX", "TROPONINI" in the last 168 hours. BNP (last 3 results) No results for input(s): "PROBNP" in the last 8760 hours. HbA1C: No results for input(s): "HGBA1C" in the last 72 hours. CBG: Recent Labs  Lab 02/08/23 0644 02/08/23 0923  GLUCAP 124* 90   Lipid Profile: No results for input(s): "CHOL", "HDL", "LDLCALC", "TRIG", "CHOLHDL", "LDLDIRECT" in the last 72 hours. Thyroid Function Tests: No results for input(s): "TSH", "T4TOTAL", "FREET4", "T3FREE", "THYROIDAB" in the last 72 hours. Anemia Panel: No results for input(s): "VITAMINB12", "FOLATE", "FERRITIN", "TIBC", "IRON", "RETICCTPCT" in the last 72 hours. Urine analysis:    Component Value Date/Time   COLORURINE AMBER (A) 02/08/2023 0734   APPEARANCEUR CLEAR 02/08/2023 0734   LABSPEC 1.026 02/08/2023 0734   PHURINE 5.0 02/08/2023 0734   GLUCOSEU NEGATIVE 02/08/2023 0734   HGBUR  NEGATIVE 02/08/2023 0734   BILIRUBINUR NEGATIVE 02/08/2023 0734   KETONESUR 5 (A) 02/08/2023 0734   PROTEINUR >=300 (A) 02/08/2023 0734   UROBILINOGEN 1.0 09/24/2015 1907   NITRITE NEGATIVE 02/08/2023 0734   LEUKOCYTESUR NEGATIVE 02/08/2023 0734    Radiological Exams on Admission: CT CHEST ABDOMEN PELVIS W CONTRAST  Result Date: 02/08/2023 CLINICAL DATA:  Sepsis. Shortness of breath, diarrhea and fever. History of CHF and COPD. History of lung cancer in remission. EXAM: CT CHEST, ABDOMEN, AND PELVIS WITH CONTRAST TECHNIQUE: Multidetector CT imaging of the chest, abdomen and pelvis was performed following the standard protocol during bolus administration of intravenous contrast. RADIATION DOSE REDUCTION: This exam was performed according to the departmental dose-optimization program which includes automated exposure control, adjustment of the mA and/or kV according to patient size and/or use of iterative reconstruction technique. CONTRAST:  110m OMNIPAQUE IOHEXOL 300 MG/ML  SOLN COMPARISON:  Chest CT dated 02/02/2023. CT abdomen and pelvis dated 07/31/2015. FINDINGS: CT CHEST FINDINGS Cardiovascular: No thoracic aortic aneurysm or evidence of aortic dissection. No clinically significant pericardial effusion. No central obstructing pulmonary embolism is seen. Aortic and coronary atherosclerosis. Mediastinum/Nodes: The mildly prominent lymph nodes described in detail on chest CT report of 02/02/2023 are not significantly changed in the short-term interval. Esophagus is unremarkable. Trachea is unremarkable. Lungs/Pleura: RIGHT upper lobe consolidation, compatible with chronic radiation fibrosis, is stable. There are increasing consolidations within the RIGHT infrahilar and posterior RIGHT lower lung. Stable small consolidation within the lingula. Stable chronic consolidation/banding at the most inferior aspect of the RIGHT lung base. Stable centrilobular and paraseptal emphysematous changes bilaterally,  upper lobe predominant. Musculoskeletal: No acute or suspicious osseous abnormality. CT ABDOMEN PELVIS FINDINGS Hepatobiliary: No focal liver abnormality is seen. Status post cholecystectomy. No bile duct dilatation is seen. Pancreas: Unremarkable. No pancreatic ductal dilatation or surrounding inflammatory changes. Spleen: Normal in size without focal abnormality. Adrenals/Urinary Tract: Benign RIGHT adrenal adenoma. No follow-up imaging is needed for this benign finding. LEFT adrenal glands unremarkable. Kidneys are unremarkable without suspicious mass, stone or hydronephrosis. No perinephric inflammation. No ureteral or bladder calculi are identified. Stomach/Bowel: No dilated large or small bowel loops. Fluid is seen throughout majority of the nondistended small bowel, with least mildly prominent enhancement of the walls of  the small bowel throughout the lower abdomen and pelvis. There is a RIGHT inguinal hernia which contains a portion of the small bowel, but there is no evidence of obstruction or inflammation at this hernia site. Stomach is unremarkable.  Appendix appears normal. Vascular/Lymphatic: Aortic atherosclerosis. No abdominal aortic aneurysm. No acute-appearing vascular abnormality. No enlarged lymph nodes are seen in the abdomen or pelvis. Reproductive: Prostate is unremarkable. Other: No free fluid or abscess collection. No free intraperitoneal air. Musculoskeletal: Degenerative spondylosis of the lumbar spine, moderate in degree. No acute-appearing osseous abnormality. IMPRESSION: 1. Increasing consolidations within the RIGHT infrahilar and posterior RIGHT lower lung, suspicious for pneumonia or aspiration. 2. Fluid is seen throughout majority of the nondistended small bowel, with least mildly prominent enhancement of the walls of the small bowel throughout the lower abdomen and pelvis. Findings are suspicious for a mild enteritis of infectious or inflammatory nature. 3. RIGHT inguinal hernia  which contains a portion of the small bowel, but there is no evidence of bowel obstruction or inflammation at this hernia site. 4. Stable appearance of the radiation fibrosis in the RIGHT upper lung. 5. Nonspecific mild mediastinal and supraclavicular lymphadenopathy, stable compared to the recent chest CT of 02/02/2023, with this recent CT report recommending either PET-CT for further characterization or short-term follow-up chest CT in 3 months. Emphysema (ICD10-J43.9). Electronically Signed   By: Franki Cabot M.D.   On: 02/08/2023 08:31   DG Chest Portable 1 View  Result Date: 02/08/2023 CLINICAL DATA:  77 year old male with clinical findings concerning for pneumonia. EXAM: PORTABLE CHEST 1 VIEW COMPARISON:  Chest x-ray 11/26/2022.  Chest CT 02/02/2023. FINDINGS: Persistent mass-like architectural distortion throughout the upper right hemithorax, similar to the prior study, most compatible with an area of chronic postradiation mass-like fibrosis. New mass-like opacities in the base of the right lung concerning for potential airspace consolidation. Widespread interstitial prominence and peribronchial cuffing again noted. No consolidative airspace disease in the left lung. No left pleural effusion. Chronic moderate partially loculated right pleural effusion is unchanged. Heart size is normal. Mediastinal contours remain grossly distorted by postradiation changes. IMPRESSION: 1. Probable new area of airspace consolidation in the base of the right lung concerning for pneumonia. 2. Chronic postradiation changes in the right hemithorax related to treated lung cancer redemonstrated, as above. Electronically Signed   By: Vinnie Langton M.D.   On: 02/08/2023 07:30     Assessment/Plan Active Problems:   Anemia of chronic disease   Dyslipidemia associated with type 2 diabetes mellitus ([place])   COPD mixed type (HCC)   Lung cancer ([place])   Pulmonary embolism (HCC)   Sepsis (HCC)    Sepsis secondary to right  lower lobe pneumonia likely CAP as well as enteritis -Levaquin given in ED, maintain on azithromycin and Rocephin for now -Check urine Legionella and strep pneumonia -Monitor blood cultures  History of COPD with acute on chronic hypoxemia secondary to above -No acute bronchospasms currently noted, but will schedule DuoNebs every 6 hours for now -Avoid steroids for now -Typically on 2 L nasal cannula oxygen  Anemia -Likely related to prior chemotherapy -Check anemia panel and follow CBC -No signs of overt bleeding noted  Type 2 diabetes with peripheral neuropathy -Hold home medications -Started on SSI  Lung mass/squamous cell carcinoma -Patient has been in remission status post radiation and chemotherapy -Noted to have prior bronchoscopy 11/25/2022   DVT prophylaxis: Lovenox Code Status: Full Family Communication: None at bedside Disposition Plan:Admit for treatment of pneumonia Consults called:None Admission  status: Inpatient, Tele  Severity of Illness: The appropriate patient status for this patient is INPATIENT. Inpatient status is judged to be reasonable and necessary in order to provide the required intensity of service to ensure the patient's safety. The patient's presenting symptoms, physical exam findings, and initial radiographic and laboratory data in the context of their chronic comorbidities is felt to place them at high risk for further clinical deterioration. Furthermore, it is not anticipated that the patient will be medically stable for discharge from the hospital within 2 midnights of admission.   * I certify that at the point of admission it is my clinical judgment that the patient will require inpatient hospital care spanning beyond 2 midnights from the point of admission due to high intensity of service, high risk for further deterioration and high frequency of surveillance required.*   Medha Pippen D Jerral Mccauley DO [facility]  If 7PM-7AM, please contact  night-coverage [email or web address]  02/08/2023, 9:37 AM

## 2023-02-08 NOTE — ED Notes (Signed)
Attending aware of oxygen decline.

## 2023-02-08 NOTE — Progress Notes (Signed)
Pt currently unable to wear bipap due to vomit episode. Pt struggles to leave breathing tx and NRB on his face. Pt currently on a 9L salter.

## 2023-02-08 NOTE — ED Notes (Signed)
Report attempted to [place], [place]

## 2023-02-08 NOTE — ED Notes (Signed)
Patient calming down after ativan given.

## 2023-02-08 NOTE — ED Notes (Signed)
RT at bedside.

## 2023-02-08 NOTE — ED Notes (Signed)
Attempted report but room has not been approved yet per nurse staff.

## 2023-02-09 ENCOUNTER — Encounter (HOSPITAL_COMMUNITY): Payer: Self-pay | Admitting: Internal Medicine

## 2023-02-09 DIAGNOSIS — J189 Pneumonia, unspecified organism: Secondary | ICD-10-CM | POA: Diagnosis not present

## 2023-02-09 DIAGNOSIS — A419 Sepsis, unspecified organism: Secondary | ICD-10-CM | POA: Diagnosis not present

## 2023-02-09 LAB — GASTROINTESTINAL PANEL BY PCR, STOOL (REPLACES STOOL CULTURE)

## 2023-02-09 LAB — BLOOD CULTURE ID PANEL (REFLEXED) - BCID2

## 2023-02-09 LAB — BASIC METABOLIC PANEL
Anion gap: 7 (ref 5–15)
BUN: 18 mg/dL (ref 8–23)
CO2: 22 mmol/L (ref 22–32)
Calcium: 8.4 mg/dL — ABNORMAL LOW (ref 8.9–10.3)
Chloride: 109 mmol/L (ref 98–111)
Creatinine, Ser: 0.89 mg/dL (ref 0.61–1.24)
GFR, Estimated: >60 mL/min (ref >60)
Glucose, Bld: 83 mg/dL (ref 70–99)
Potassium: 4.2 mmol/L (ref 3.5–5.1)
Sodium: 138 mmol/L (ref 135–145)

## 2023-02-09 LAB — CBC
HCT: 38.9 % — ABNORMAL LOW (ref 39.0–52.0)
Hemoglobin: 12.1 g/dL — ABNORMAL LOW (ref 13.0–17.0)
MCH: 28.9 pg (ref 26.0–34.0)
MCHC: 31.1 g/dL (ref 30.0–36.0)
MCV: 93.1 fL (ref 80.0–100.0)
Platelets: 163 10*3/uL (ref 150–400)
RBC: 4.18 MIL/uL — ABNORMAL LOW (ref 4.22–5.81)
RDW: 17.8 % — ABNORMAL HIGH (ref 11.5–15.5)
WBC: 14 10*3/uL — ABNORMAL HIGH (ref 4.0–10.5)
nRBC: 0 % (ref 0.0–0.2)

## 2023-02-09 LAB — C DIFFICILE QUICK SCREEN W PCR REFLEX
C Diff antigen: NEGATIVE
C Diff interpretation: NOT DETECTED
C Diff toxin: NEGATIVE

## 2023-02-09 LAB — GLUCOSE, CAPILLARY
Glucose-Capillary: 72 mg/dL (ref 70–99)
Glucose-Capillary: 147 mg/dL — ABNORMAL HIGH (ref 70–99)
Glucose-Capillary: 246 mg/dL — ABNORMAL HIGH (ref 70–99)
Glucose-Capillary: 303 mg/dL — ABNORMAL HIGH (ref 70–99)

## 2023-02-09 MED ORDER — VITAMIN B-12 1000 MCG PO TABS
1000.0000 ug | ORAL_TABLET | Freq: Every day | ORAL | Status: DC
  Administered 2023-02-10 – 2023-02-16 (×7): 1000 ug via ORAL
  Filled 2023-02-09 (×7): qty 1

## 2023-02-09 MED ORDER — ORAL CARE MOUTH RINSE: 15.0000 mL | OROMUCOSAL | Status: DC | PRN

## 2023-02-09 MED ORDER — ADULT MULTIVITAMIN W/MINERALS CH
1.0000 | ORAL_TABLET | Freq: Every day | ORAL | Status: DC
  Administered 2023-02-09 – 2023-02-16 (×8): 1 via ORAL
  Filled 2023-02-09 (×8): qty 1

## 2023-02-09 MED ORDER — LORAZEPAM 2 MG/ML IJ SOLN: 0.5000 mg | INTRAMUSCULAR | Status: DC | PRN

## 2023-02-09 MED ORDER — SODIUM CHLORIDE 3 % IN NEBU
4.0000 mL | INHALATION_SOLUTION | Freq: Every day | RESPIRATORY_TRACT | Status: AC
  Administered 2023-02-09 – 2023-02-11 (×3): 4 mL via RESPIRATORY_TRACT
  Filled 2023-02-09 (×3): qty 4

## 2023-02-09 MED ORDER — FERROUS SULFATE 325 (65 FE) MG PO TABS
325.0000 mg | ORAL_TABLET | Freq: Every day | ORAL | Status: DC
  Administered 2023-02-09 – 2023-02-16 (×8): 325 mg via ORAL
  Filled 2023-02-09 (×8): qty 1

## 2023-02-09 MED ORDER — IPRATROPIUM-ALBUTEROL 0.5-2.5 (3) MG/3ML IN SOLN: 3.0000 mL | Freq: Four times a day (QID) | RESPIRATORY_TRACT | Status: DC | PRN

## 2023-02-09 MED ORDER — REVEFENACIN 175 MCG/3ML IN SOLN
175.0000 ug | Freq: Every day | RESPIRATORY_TRACT | Status: DC
  Administered 2023-02-09 – 2023-02-15 (×7): 175 ug via RESPIRATORY_TRACT
  Filled 2023-02-09 (×8): qty 3

## 2023-02-09 MED ORDER — ARFORMOTEROL TARTRATE 15 MCG/2ML IN NEBU
15.0000 ug | INHALATION_SOLUTION | Freq: Two times a day (BID) | RESPIRATORY_TRACT | Status: DC
  Administered 2023-02-09 – 2023-02-15 (×14): 15 ug via RESPIRATORY_TRACT
  Filled 2023-02-09 (×16): qty 2

## 2023-02-09 MED ORDER — GUAIFENESIN-DM 100-10 MG/5ML PO SYRP
10.0000 mL | ORAL_SOLUTION | Freq: Four times a day (QID) | ORAL | Status: DC
  Administered 2023-02-09 (×2): 10 mL via ORAL
  Filled 2023-02-09 (×5): qty 10

## 2023-02-09 MED ORDER — CYANOCOBALAMIN 1000 MCG/ML IJ SOLN
1000.0000 ug | Freq: Once | INTRAMUSCULAR | Status: AC
  Administered 2023-02-09: 1000 ug via INTRAMUSCULAR
  Filled 2023-02-09: qty 1

## 2023-02-09 MED ORDER — ENSURE MAX PROTEIN PO LIQD
11.0000 [oz_av] | Freq: Two times a day (BID) | ORAL | Status: DC
  Administered 2023-02-09 – 2023-02-16 (×12): 11 [oz_av] via ORAL
  Filled 2023-02-09 (×15): qty 330

## 2023-02-09 MED ORDER — BUDESONIDE 0.5 MG/2ML IN SUSP
0.5000 mg | Freq: Two times a day (BID) | RESPIRATORY_TRACT | Status: DC
  Administered 2023-02-09 – 2023-02-15 (×14): 0.5 mg via RESPIRATORY_TRACT
  Filled 2023-02-09 (×15): qty 2

## 2023-02-09 MED ORDER — METHYLPREDNISOLONE SODIUM SUCC 40 MG IJ SOLR
40.0000 mg | Freq: Two times a day (BID) | INTRAMUSCULAR | Status: DC
  Administered 2023-02-09 – 2023-02-10 (×3): 40 mg via INTRAVENOUS
  Filled 2023-02-09 (×3): qty 1

## 2023-02-09 NOTE — Progress Notes (Signed)
PHARMACY - PHYSICIAN COMMUNICATION CRITICAL VALUE ALERT - BLOOD CULTURE IDENTIFICATION (BCID)  Daryl Vega is an 77 y.o. male who presented to [facility] on 02/08/2023 with a chief complaint of shortness of breath, diarrhea, fever, chills, hypoxia.  Assessment:  2/4 blood culture bottles with GNR.  BCID shows Haemophilus influenzae  Name of physician (or Provider) Contacted: Dr Tawanna Solo  Current antibiotics: Azithromycin, Ceftriaxone  Changes to prescribed antibiotics recommended:  Recommendations declined by provider - infection may be polymicrobial - Dr Tawanna Solo wants to continue both Azithromycin, Ceftriaxone.  Results for orders placed or performed during the hospital encounter of 02/08/23  Blood Culture ID Panel (Reflexed) (Collected: 02/08/2023  6:50 AM)  Result Value Ref Range   Enterococcus faecalis NOT DETECTED NOT DETECTED   Enterococcus Faecium NOT DETECTED NOT DETECTED   Listeria monocytogenes NOT DETECTED NOT DETECTED   Staphylococcus species NOT DETECTED NOT DETECTED   Staphylococcus aureus (BCID) NOT DETECTED NOT DETECTED   Staphylococcus epidermidis NOT DETECTED NOT DETECTED   Staphylococcus lugdunensis NOT DETECTED NOT DETECTED   Streptococcus species NOT DETECTED NOT DETECTED   Streptococcus agalactiae NOT DETECTED NOT DETECTED   Streptococcus pneumoniae NOT DETECTED NOT DETECTED   Streptococcus pyogenes NOT DETECTED NOT DETECTED   A.calcoaceticus-baumannii NOT DETECTED NOT DETECTED   Bacteroides fragilis NOT DETECTED NOT DETECTED   Enterobacterales NOT DETECTED NOT DETECTED   Enterobacter cloacae complex NOT DETECTED NOT DETECTED   Escherichia coli NOT DETECTED NOT DETECTED   Klebsiella aerogenes NOT DETECTED NOT DETECTED   Klebsiella oxytoca NOT DETECTED NOT DETECTED   Klebsiella pneumoniae NOT DETECTED NOT DETECTED   Proteus species NOT DETECTED NOT DETECTED   Salmonella species NOT DETECTED NOT DETECTED   Serratia marcescens NOT DETECTED NOT  DETECTED   Haemophilus influenzae DETECTED (A) NOT DETECTED   Neisseria meningitidis NOT DETECTED NOT DETECTED   Pseudomonas aeruginosa NOT DETECTED NOT DETECTED   Stenotrophomonas maltophilia NOT DETECTED NOT DETECTED   Candida albicans NOT DETECTED NOT DETECTED   Candida auris NOT DETECTED NOT DETECTED   Candida glabrata NOT DETECTED NOT DETECTED   Candida krusei NOT DETECTED NOT DETECTED   Candida parapsilosis NOT DETECTED NOT DETECTED   Candida tropicalis NOT DETECTED NOT DETECTED   Cryptococcus neoformans/gattii NOT DETECTED NOT DETECTED    Gretta Arab PharmD, BCPS [facility] main pharmacy [phone] 02/09/2023 1:44 PM

## 2023-02-09 NOTE — Progress Notes (Signed)
PROGRESS NOTE  [facility]  [id number] DOB: 12/14/1946 DOA: 02/08/2023 PCP: Nelly Laurence, NP   Brief Narrative: Patient is a 60 male with history of COPD, chronic hypoxic failure on 2 L of oxygen, lung cancer status post chemo/radiation, diabetes type 2, PE who presented to emergency department at [facility] with complaints of shortness of breath, diarrhea, fever, chills.  On presentation he was febrile, tachycardic, tachypneic.  Labs showed leukocytosis.  Patient was requiring high amount of oxygen than his baseline.  Transferred to [facility]  hospital because there was no available stepdown bed at [facility].  Assessment & Plan:  Active Problems:   Anemia of chronic disease   Dyslipidemia associated with type 2 diabetes mellitus ([place])   COPD mixed type ([place])   Lung cancer ([place])   Pulmonary embolism ([facility])   Sepsis ([place])  Severe sepsis secondary to right-sided pneumonia/gram-negative bacteremia: Presented with fever, dyspnea, tachypnea, leukocytosis, lactic acidosis.  Chest imaging showed right-sided consolidation suspicious for pneumonia.  Blood cultures showing gram-negative rods on both sides.  Continue current antibiotic regimen.  Currently afebrile, blood pressure stable  Acute on chronic hypoxic respiratory failure/COPD exacerbation: Secondary to pneumonia.  On 2 L of oxygen per minute at home with history of COPD.  Currently on 6 L of high flow oxygen.  Will continue to attempt to wean.  Complains of congestion.  Ordered chest PT, hypertonic saline nebulization.  Found to be wheezing so started on steroids, Candiss Norse, Pulmicort  Diarrhea/enteritis: Abdominal imaging suggestive of enteritis.  Patient was having diarrhea.  No history of recent antibiotic use.  Denies any abdominal pain today but has some lower abdominal discomfort.  UA was not suspicious for UTI.  C. difficile negative, GI pathogen panel pending.  History of lung cancer: Status post  chemo/radiation.  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  Also showed nonspecific mediastinal/supraclavicular lymphadenopathy.  Needs to rule out recurrence/metastasis.  PET/CT recommended in outpatient.  Needs to follow-up with his oncologist after discharge, follows at  [facility]  Normocytic anemia: Currently hemoglobin stable.  Iron studies showed low iron, started on oral supplementation.  Vitamin B-12 found to be low.  Will give a dose of IM vitamin B-12 and continue on oral supplementation.  Diabetes type 2 with peripheral neuropathy: Home medications on hold.  Currently on sliding scale  insulin.  History of hyperlipidemia: On statin  Weakness : Sometimes uses walker,cane for ambulation. PT consulted       DVT prophylaxis:enoxaparin (LOVENOX) injection 40 mg Start: 02/08/23 2200     Code Status: Full Code  Family Communication: Called and discussed with son on phone on 2/26  Patient status:Inpatient  Patient is from :Home  Anticipated discharge [id number]  Estimated DC date:2-3 days   Consultants: None  Procedures:None  Antimicrobials:  Anti-infectives (From admission, onward)    Start     Dose/Rate Route Frequency Ordered Stop   02/09/23 1000  azithromycin (ZITHROMAX) 500 mg in sodium chloride 0.9 % 250 mL IVPB        500 mg 250 mL/hr over 60 Minutes Intravenous Daily 02/08/23 0942 02/14/23 0959   02/08/23 1200  cefTRIAXone (ROCEPHIN) 2 g in sodium chloride 0.9 % 100 mL IVPB        2 g 200 mL/hr over 30 Minutes Intravenous Daily 02/08/23 0942 02/13/23 0759   02/08/23 0845  levofloxacin (LEVAQUIN) IVPB 500 mg        500 mg 100 mL/hr over 60 Minutes Intravenous  Once 02/08/23 0841  02/08/23 1046       Subjective: Patient seen and examined at bedside today.  Hemodynamically stable.  On 6 L of high flow oxygen.  Continues to cough, sounds congested.  Alert and oriented.  Complains of generalized pain, some lower abdominal discomfort.  Abdomen is  mostly benign.  Speaking in full sentences.  Objective: Vitals:   02/09/23 0400 02/09/23 0500 02/09/23 0600 02/09/23 0700  BP: 100/68 (!) 101/59 109/63 127/68  Pulse: 96 100 (!) 103 (!) 110  Resp: (!) 23  (!) 28 (!) 22  Temp:      TempSrc:      SpO2: 100% 100% 100% 97%  Weight:      Height:        Intake/Output Summary (Last 24 hours) at 02/09/2023 0740 Last data filed at 02/09/2023 0121 Gross per 24 hour  Intake 3320.57 ml  Output --  Net 3320.57 ml   Filed Weights   02/08/23 0635  Weight: 72.1 kg    Examination:  General exam: Overall comfortable, not in distress, appears deconditioned, weak HEENT: PERRL Respiratory system: Bilateral expiratory wheezing, rhonchi,  Cardiovascular system: S1 & S2 heard, RRR.  Gastrointestinal system: Abdomen is nondistended, soft and nontender. Central nervous system: Alert and oriented Extremities: No edema, no clubbing ,no cyanosis Skin: No rashes, no ulcers,no icterus     Data Reviewed: I have personally reviewed following labs and imaging studies  CBC: Recent Labs  Lab 02/08/23 0639  WBC 13.2*  NEUTROABS 10.9*  HGB 11.8*  HCT 36.0*  MCV 88.7  PLT 123456   Basic Metabolic Panel: Recent Labs  Lab 02/08/23 0639  NA 137  K 3.7  CL 110  CO2 20*  GLUCOSE 124*  BUN 18  CREATININE 1.01  CALCIUM 8.6*     Recent Results (from the past 240 hour(s))  Resp panel by RT-PCR (RSV, Flu A&B, Covid) Anterior Nasal Swab     Status: None   Collection Time: 02/08/23  6:45 AM   Specimen: Anterior Nasal Swab  Result Value Ref Range Status   SARS Coronavirus 2 by RT PCR NEGATIVE NEGATIVE Final    Comment: (NOTE) SARS-CoV-2 target nucleic acids are NOT DETECTED.  The SARS-CoV-2 RNA is generally detectable in upper respiratory specimens during the acute phase of infection. The lowest concentration of SARS-CoV-2 viral copies this assay can detect is 138 copies/mL. A negative result does not preclude SARS-Cov-2 infection and should  not be used as the sole basis for treatment or other patient management decisions. A negative result may occur with  improper specimen collection/handling, submission of specimen other than nasopharyngeal swab, presence of viral mutation(s) within the areas targeted by this assay, and inadequate number of viral copies(<138 copies/mL). A negative result must be combined with clinical observations, patient history, and epidemiological information. The expected result is Negative.  Fact Sheet for Patients:  EntrepreneurPulse.com.au  Fact Sheet for Healthcare Providers:  IncredibleEmployment.be  This test is no t yet approved or cleared by the [place] FDA and  has been authorized for detection and/or diagnosis of SARS-CoV-2 by FDA under an Emergency Use Authorization (EUA). This EUA will remain  in effect (meaning this test can be used) for the duration of the COVID-19 declaration under Section 564(b)(1) of the Act, 21 U.S.C.section 360bbb-3(b)(1), unless the authorization is terminated  or revoked sooner.       Influenza A by PCR NEGATIVE NEGATIVE Final   Influenza B by PCR NEGATIVE NEGATIVE Final    Comment: (NOTE) The  Xpert Xpress SARS-CoV-2/FLU/RSV plus assay is intended as an aid in the diagnosis of influenza from Nasopharyngeal swab specimens and should not be used as a sole basis for treatment. Nasal washings and aspirates are unacceptable for Xpert Xpress SARS-CoV-2/FLU/RSV testing.  Fact Sheet for Patients: EntrepreneurPulse.com.au  Fact Sheet for Healthcare Providers: IncredibleEmployment.be  This test is not yet approved or cleared by the [place] FDA and has been authorized for detection and/or diagnosis of SARS-CoV-2 by FDA under an Emergency Use Authorization (EUA). This EUA will remain in effect (meaning this test can be used) for the duration of the COVID-19 declaration under  Section 564(b)(1) of the Act, 21 U.S.C. section 360bbb-3(b)(1), unless the authorization is terminated or revoked.     Resp Syncytial Virus by PCR NEGATIVE NEGATIVE Final    Comment: (NOTE) Fact Sheet for Patients: EntrepreneurPulse.com.au  Fact Sheet for Healthcare Providers: IncredibleEmployment.be  This test is not yet approved or cleared by the [place] FDA and has been authorized for detection and/or diagnosis of SARS-CoV-2 by FDA under an Emergency Use Authorization (EUA). This EUA will remain in effect (meaning this test can be used) for the duration of the COVID-19 declaration under Section 564(b)(1) of the Act, 21 U.S.C. section 360bbb-3(b)(1), unless the authorization is terminated or revoked.  Performed at [facility], [address] [place], [place] [address]   Blood culture (routine x 2)     Status: None (Preliminary result)   Collection Time: 02/08/23  6:45 AM   Specimen: BLOOD  Result Value Ref Range Status   Specimen Description BLOOD BLOOD RIGHT ARM  Final   Special Requests Blood Culture adequate volume  Final   Culture   Final    NO GROWTH < 24 HOURS Performed at [facility], [address] [place], [place] [address]    Report Status PENDING  Incomplete  Blood culture (routine x 2)     Status: None (Preliminary result)   Collection Time: 02/08/23  6:50 AM   Specimen: BLOOD  Result Value Ref Range Status   Specimen Description BLOOD BLOOD LEFT HAND  Final   Special Requests Blood Culture adequate volume  Final   Culture   Final    NO GROWTH < 24 HOURS Performed at [facility], [address], [place], [place] [address]    Report Status PENDING  Incomplete     Radiology Studies: CT CHEST ABDOMEN PELVIS W CONTRAST  Result Date: 02/08/2023 CLINICAL DATA:  Sepsis. Shortness of breath, diarrhea and fever. History of CHF and COPD. History of lung cancer in remission. EXAM: CT CHEST, ABDOMEN, AND PELVIS WITH  CONTRAST TECHNIQUE: Multidetector CT imaging of the chest, abdomen and pelvis was performed following the standard protocol during bolus administration of intravenous contrast. RADIATION DOSE REDUCTION: This exam was performed according to the departmental dose-optimization program which includes automated exposure control, adjustment of the mA and/or kV according to patient size and/or use of iterative reconstruction technique. CONTRAST:  162m OMNIPAQUE IOHEXOL 300 MG/ML  SOLN COMPARISON:  Chest CT dated 02/02/2023. CT abdomen and pelvis dated 07/31/2015. FINDINGS: CT CHEST FINDINGS Cardiovascular: No thoracic aortic aneurysm or evidence of aortic dissection. No clinically significant pericardial effusion. No central obstructing pulmonary embolism is seen. Aortic and coronary atherosclerosis. Mediastinum/Nodes: The mildly prominent lymph nodes described in detail on chest CT report of 02/02/2023 are not significantly changed in the short-term interval. Esophagus is unremarkable. Trachea is unremarkable. Lungs/Pleura: RIGHT upper lobe consolidation, compatible with chronic radiation fibrosis, is stable. There are increasing  consolidations within the RIGHT infrahilar and posterior RIGHT lower lung. Stable small consolidation within the lingula. Stable chronic consolidation/banding at the most inferior aspect of the RIGHT lung base. Stable centrilobular and paraseptal emphysematous changes bilaterally, upper lobe predominant. Musculoskeletal: No acute or suspicious osseous abnormality. CT ABDOMEN PELVIS FINDINGS Hepatobiliary: No focal liver abnormality is seen. Status post cholecystectomy. No bile duct dilatation is seen. Pancreas: Unremarkable. No pancreatic ductal dilatation or surrounding inflammatory changes. Spleen: Normal in size without focal abnormality. Adrenals/Urinary Tract: Benign RIGHT adrenal adenoma. No follow-up imaging is needed for this benign finding. LEFT adrenal glands unremarkable. Kidneys are  unremarkable without suspicious mass, stone or hydronephrosis. No perinephric inflammation. No ureteral or bladder calculi are identified. Stomach/Bowel: No dilated large or small bowel loops. Fluid is seen throughout majority of the nondistended small bowel, with least mildly prominent enhancement of the walls of the small bowel throughout the lower abdomen and pelvis. There is a RIGHT inguinal hernia which contains a portion of the small bowel, but there is no evidence of obstruction or inflammation at this hernia site. Stomach is unremarkable.  Appendix appears normal. Vascular/Lymphatic: Aortic atherosclerosis. No abdominal aortic aneurysm. No acute-appearing vascular abnormality. No enlarged lymph nodes are seen in the abdomen or pelvis. Reproductive: Prostate is unremarkable. Other: No free fluid or abscess collection. No free intraperitoneal air. Musculoskeletal: Degenerative spondylosis of the lumbar spine, moderate in degree. No acute-appearing osseous abnormality. IMPRESSION: 1. Increasing consolidations within the RIGHT infrahilar and posterior RIGHT lower lung, suspicious for pneumonia or aspiration. 2. Fluid is seen throughout majority of the nondistended small bowel, with least mildly prominent enhancement of the walls of the small bowel throughout the lower abdomen and pelvis. Findings are suspicious for a mild enteritis of infectious or inflammatory nature. 3. RIGHT inguinal hernia which contains a portion of the small bowel, but there is no evidence of bowel obstruction or inflammation at this hernia site. 4. Stable appearance of the radiation fibrosis in the RIGHT upper lung. 5. Nonspecific mild mediastinal and supraclavicular lymphadenopathy, stable compared to the recent chest CT of 02/02/2023, with this recent CT report recommending either PET-CT for further characterization or short-term follow-up chest CT in 3 months. Emphysema (ICD10-J43.9). Electronically Signed   By: Franki Cabot M.D.    On: 02/08/2023 08:31   DG Chest Portable 1 View  Result Date: 02/08/2023 CLINICAL DATA:  77 year old male with clinical findings concerning for pneumonia. EXAM: PORTABLE CHEST 1 VIEW COMPARISON:  Chest x-ray 11/26/2022.  Chest CT 02/02/2023. FINDINGS: Persistent mass-like architectural distortion throughout the upper right hemithorax, similar to the prior study, most compatible with an area of chronic postradiation mass-like fibrosis. New mass-like opacities in the base of the right lung concerning for potential airspace consolidation. Widespread interstitial prominence and peribronchial cuffing again noted. No consolidative airspace disease in the left lung. No left pleural effusion. Chronic moderate partially loculated right pleural effusion is unchanged. Heart size is normal. Mediastinal contours remain grossly distorted by postradiation changes. IMPRESSION: 1. Probable new area of airspace consolidation in the base of the right lung concerning for pneumonia. 2. Chronic postradiation changes in the right hemithorax related to treated lung cancer redemonstrated, as above. Electronically Signed   By: Vinnie Langton M.D.   On: 02/08/2023 07:30    Scheduled Meds:  Chlorhexidine Gluconate Cloth  6 each Topical Q0600   enoxaparin (LOVENOX) injection  40 mg Subcutaneous Q24H   gabapentin  900 mg Oral TID   insulin aspart  0-5 Units Subcutaneous QHS  insulin aspart  0-9 Units Subcutaneous TID WC   insulin aspart  2 Units Subcutaneous TID WC   ipratropium-albuterol  3 mL Nebulization Q6H   mirtazapine  7.5 mg Oral QHS   rosuvastatin  20 mg Oral QHS   sodium chloride flush  3 mL Intravenous Q12H   Continuous Infusions:  sodium chloride     azithromycin     cefTRIAXone (ROCEPHIN)  IV Stopped (02/08/23 1150)     LOS: 1 day   Shelly Coss, MD [facility] P2/26/2024, 7:40 AM

## 2023-02-09 NOTE — Progress Notes (Signed)
Initial Nutrition Assessment  DOCUMENTATION CODES:   Non-severe (moderate) malnutrition in context of chronic illness  INTERVENTION:   -Ensure MAX Protein po BID, each supplement provides 150 kcal and 30 grams of protein   -Multivitamin with minerals daily  NUTRITION DIAGNOSIS:   Moderate Malnutrition related to chronic illness (COPD, h/o lung cancer) as evidenced by moderate fat depletion, moderate muscle depletion, energy intake < or equal to 75% for > or equal to 1 month.  GOAL:   Patient will meet greater than or equal to 90% of their needs  MONITOR:   PO intake, Supplement acceptance, Labs, Weight trends, I & O's  REASON FOR ASSESSMENT:   Malnutrition Screening Tool    ASSESSMENT:   77 y.o. 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  Patient in room, trying to eat lunch. Has a persistent cough. States his appetite is not good and he has noticed weight loss. Pt eating some spaghetti. State she has not eaten breakfast. Not interested initially in protein shakes but agreed he should drink them since he has not been eating well and has had weight loss. Denies any issues with swallowing.  Per weight records, no weight loss noted. Pt reports he has lost 20 lbs. Going from 180 lbs to 160 lbs.  Medications: Vitamin B-12, Ferrous sulfate, Remeron  Labs reviewed: CBGs: 72-147   NUTRITION - FOCUSED PHYSICAL EXAM:  Flowsheet Row Most Recent Value  Orbital Region Severe depletion  Upper Arm Region Moderate depletion  Thoracic and Lumbar Region Moderate depletion  Buccal Region Moderate depletion  Temple Region Moderate depletion  Clavicle Bone Region Moderate depletion  Clavicle and Acromion Bone Region Moderate depletion  Scapular Bone Region  Moderate depletion  Dorsal Hand Moderate depletion  Patellar Region Moderate depletion  Anterior Thigh Region Moderate depletion  Posterior Calf Region Moderate depletion  Edema (RD Assessment) None  Hair Reviewed  [thin]  Eyes Reviewed  Mouth Reviewed  [missing teeth]  Skin Reviewed  [red spots over BLEs]  Nails Reviewed       Diet Order:   Diet Order             Diet Carb Modified Fluid consistency: Thin; Room service appropriate? Yes  Diet effective now                   EDUCATION NEEDS:   No education needs have been identified at this time  Skin:  Skin Assessment: Reviewed RN Assessment  Last BM:  2/26 -type 6  Height:   Ht Readings from Last 1 Encounters:  02/08/23 '5\' 8"'$  (1.727 m)    Weight:   Wt Readings from Last 1 Encounters:  02/08/23 72.1 kg    BMI:  Body mass index is 24.18 kg/m.  Estimated Nutritional Needs:   Kcal:  2150-2350  Protein:  105-115g  Fluid:  2.3L/day   Clayton Bibles, MS, RD, LDN Inpatient Clinical Dietitian Contact information available via Amion

## 2023-02-10 ENCOUNTER — Inpatient Hospital Stay (HOSPITAL_COMMUNITY): Payer: Medicare Other

## 2023-02-10 ENCOUNTER — Encounter: Payer: Self-pay | Admitting: Pulmonary Disease

## 2023-02-10 DIAGNOSIS — R7881 Bacteremia: Secondary | ICD-10-CM

## 2023-02-10 LAB — BASIC METABOLIC PANEL
Anion gap: 6 (ref 5–15)
Anion gap: 7 (ref 5–15)
BUN: 21 mg/dL (ref 8–23)
BUN: 27 mg/dL — ABNORMAL HIGH (ref 8–23)
CO2: 24 mmol/L (ref 22–32)
CO2: 23 mmol/L (ref 22–32)
Calcium: 8.4 mg/dL — ABNORMAL LOW (ref 8.9–10.3)
Calcium: 8.2 mg/dL — ABNORMAL LOW (ref 8.9–10.3)
Chloride: 105 mmol/L (ref 98–111)
Chloride: 104 mmol/L (ref 98–111)
Creatinine, Ser: 0.95 mg/dL (ref 0.61–1.24)
Creatinine, Ser: 1.04 mg/dL (ref 0.61–1.24)
GFR, Estimated: >60 mL/min (ref >60)
GFR, Estimated: >60 mL/min (ref >60)
Glucose, Bld: 433 mg/dL — ABNORMAL HIGH (ref 70–99)
Glucose, Bld: 419 mg/dL — ABNORMAL HIGH (ref 70–99)
Potassium: 3.8 mmol/L (ref 3.5–5.1)
Potassium: 3.5 mmol/L (ref 3.5–5.1)
Sodium: 135 mmol/L (ref 135–145)
Sodium: 134 mmol/L — ABNORMAL LOW (ref 135–145)

## 2023-02-10 LAB — STREP PNEUMONIAE URINARY ANTIGEN: Strep Pneumo Urinary Antigen: NEGATIVE

## 2023-02-10 LAB — GLUCOSE, CAPILLARY
Glucose-Capillary: 309 mg/dL — ABNORMAL HIGH (ref 70–99)
Glucose-Capillary: 588 mg/dL (ref 70–99)
Glucose-Capillary: 397 mg/dL — ABNORMAL HIGH (ref 70–99)
Glucose-Capillary: 326 mg/dL — ABNORMAL HIGH (ref 70–99)
Glucose-Capillary: 464 mg/dL — ABNORMAL HIGH (ref 70–99)
Glucose-Capillary: 407 mg/dL — ABNORMAL HIGH (ref 70–99)
Glucose-Capillary: 341 mg/dL — ABNORMAL HIGH (ref 70–99)

## 2023-02-10 LAB — CBC
HCT: 36.1 % — ABNORMAL LOW (ref 39.0–52.0)
Hemoglobin: 11.4 g/dL — ABNORMAL LOW (ref 13.0–17.0)
MCH: 28.5 pg (ref 26.0–34.0)
MCHC: 31.6 g/dL (ref 30.0–36.0)
MCV: 90.3 fL (ref 80.0–100.0)
Platelets: 177 10*3/uL (ref 150–400)
RBC: 4 MIL/uL — ABNORMAL LOW (ref 4.22–5.81)
RDW: 17.5 % — ABNORMAL HIGH (ref 11.5–15.5)
WBC: 13.6 10*3/uL — ABNORMAL HIGH (ref 4.0–10.5)
nRBC: 0 % (ref 0.0–0.2)

## 2023-02-10 LAB — CULTURE, BLOOD (ROUTINE X 2)
Special Requests: ADEQUATE
Special Requests: ADEQUATE

## 2023-02-10 LAB — MRSA NEXT GEN BY PCR, NASAL: MRSA by PCR Next Gen: NOT DETECTED

## 2023-02-10 LAB — HEMOGLOBIN A1C
Hgb A1c MFr Bld: 7.1 % — ABNORMAL HIGH (ref 4.8–5.6)
Mean Plasma Glucose: 157 mg/dL

## 2023-02-10 MED ORDER — INSULIN ASPART 100 UNIT/ML IJ SOLN
5.0000 [IU] | Freq: Once | INTRAMUSCULAR | Status: AC
  Administered 2023-02-10: 5 [IU] via INTRAVENOUS

## 2023-02-10 MED ORDER — INSULIN ASPART 100 UNIT/ML IJ SOLN
7.0000 [IU] | Freq: Three times a day (TID) | INTRAMUSCULAR | Status: DC
  Administered 2023-02-11 – 2023-02-13 (×8): 7 [IU] via SUBCUTANEOUS

## 2023-02-10 MED ORDER — INSULIN GLARGINE-YFGN 100 UNIT/ML ~~LOC~~ SOLN
5.0000 [IU] | Freq: Every day | SUBCUTANEOUS | Status: DC
  Filled 2023-02-10: qty 0.05

## 2023-02-10 MED ORDER — INSULIN ASPART 100 UNIT/ML IJ SOLN
3.0000 [IU] | Freq: Three times a day (TID) | INTRAMUSCULAR | Status: DC
  Administered 2023-02-10: 3 [IU] via SUBCUTANEOUS

## 2023-02-10 MED ORDER — INSULIN ASPART 100 UNIT/ML IJ SOLN
0.0000 [IU] | Freq: Three times a day (TID) | INTRAMUSCULAR | Status: DC
  Administered 2023-02-11: 4 [IU] via SUBCUTANEOUS
  Administered 2023-02-11: 7 [IU] via SUBCUTANEOUS
  Administered 2023-02-11: 11 [IU] via SUBCUTANEOUS
  Administered 2023-02-12 – 2023-02-13 (×5): 4 [IU] via SUBCUTANEOUS

## 2023-02-10 MED ORDER — AMOXICILLIN-POT CLAVULANATE 875-125 MG PO TABS
1.0000 | ORAL_TABLET | Freq: Two times a day (BID) | ORAL | Status: DC
  Administered 2023-02-11 – 2023-02-12 (×4): 1 via ORAL
  Filled 2023-02-10 (×4): qty 1

## 2023-02-10 MED ORDER — PREDNISONE 20 MG PO TABS: 40.0000 mg | ORAL_TABLET | Freq: Every day | ORAL | Status: DC

## 2023-02-10 MED ORDER — INSULIN GLARGINE-YFGN 100 UNIT/ML ~~LOC~~ SOLN
20.0000 [IU] | Freq: Every day | SUBCUTANEOUS | Status: DC
  Administered 2023-02-11 – 2023-02-13 (×3): 20 [IU] via SUBCUTANEOUS
  Filled 2023-02-10 (×3): qty 0.2

## 2023-02-10 MED ORDER — INSULIN GLARGINE-YFGN 100 UNIT/ML ~~LOC~~ SOLN
10.0000 [IU] | Freq: Every day | SUBCUTANEOUS | Status: DC
  Administered 2023-02-10: 10 [IU] via SUBCUTANEOUS
  Filled 2023-02-10: qty 0.1

## 2023-02-10 NOTE — Progress Notes (Signed)
PROGRESS NOTE  Daryl Vega [facility]  [id number] DOB: 1946-04-17 DOA: 02/08/2023 PCP: Nelly Laurence, NP   Brief Narrative: Patient is a 77 male with history of COPD, chronic hypoxic failure on 2 L of oxygen, lung cancer status post chemo/radiation, diabetes type 2, PE who presented to emergency department at [facility] with complaints of shortness of breath, diarrhea, fever, chills.  On presentation he was febrile, tachycardic, tachypneic.  Labs showed leukocytosis.  Patient was requiring high amount of oxygen than his baseline.  Transferred to [facility]  hospital because there was no available stepdown bed at [facility] Blood culture showed haemophilus influenza.  Respiratory status improving.  Assessment & Plan:  Active Problems:   Anemia of chronic disease   Dyslipidemia associated with type 2 diabetes mellitus ([place])   COPD mixed type ([place])   Lung cancer ([place])   Pulmonary embolism (HCC)   Sepsis ([place])  Severe sepsis secondary to right-sided pneumonia/gram-negative bacteremia: Presented with fever, dyspnea, tachypnea, leukocytosis, lactic acidosis.  Chest imaging showed right-sided consolidation suspicious for pneumonia.  Blood cultures showed haemophilus influenza.  Currently afebrile, blood pressure stable.  Antibiotics changed to oral, plan for 10 days course total  Acute on chronic hypoxic respiratory failure/COPD exacerbation: Secondary to pneumonia.  On 2 L of oxygen per minute at home with history of COPD.  Currently on 3-4 L of  oxygen.  Will continue to attempt to wean. On chest PT, hypertonic saline nebulization.  Found to be wheezing so started on steroids, Yupelri, Brovana, Pulmicort.We will change steroids to oral  Diarrhea/enteritis: Abdominal imaging suggestive of enteritis.  Patient was having diarrhea. UA was not suspicious for UTI.  C. difficile negative, GI pathogen panel showed noro virus  History of lung cancer: Status post chemo/radiation.  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  Also showed nonspecific mediastinal/supraclavicular lymphadenopathy.  Needs to rule out recurrence/metastasis.  PET/CT recommended in outpatient.  Needs to follow-up with his oncologist after discharge, follows at  [facility]  Normocytic anemia: Currently hemoglobin stable.  Iron studies showed low iron, started on oral supplementation.  Vitamin B-12 found to be low.  Will give a dose of IM vitamin B-12 and continue on oral supplementation.  Diabetes type 2 with peripheral neuropathy: Home medications on hold.  Currently on sliding scale  insulin,long acting insulin.A1c of 7.1  History of hyperlipidemia: On statin  Weakness : Sometimes uses walker,cane for ambulation. PT /OT consulted  Nutrition Problem: Moderate Malnutrition Etiology: chronic illness (COPD, h/o lung cancer)    DVT prophylaxis:enoxaparin (LOVENOX) injection 40 mg Start: 02/08/23 2200     Code Status: Full Code  Family Communication: Called and discussed with son on phone on 2/26  Patient status:Inpatient  Patient is from :Home  Anticipated discharge AZ:[id number]  Estimated DC date:1-2 days   Consultants: None  Procedures:None  Antimicrobials:  Anti-infectives (From admission, onward)    Start     Dose/Rate Route Frequency Ordered Stop   02/11/23 1000  amoxicillin-clavulanate (AUGMENTIN) 875-125 MG per tablet 1 tablet        1 tablet Oral Every 12 hours 02/10/23 0921 02/18/23 0959   02/09/23 1000  azithromycin (ZITHROMAX) 500 mg in sodium chloride 0.9 % 250 mL IVPB  Status:  Discontinued        500 mg 250 mL/hr over 60 Minutes Intravenous Daily 02/08/23 0942 02/10/23 0921   02/08/23 1200  cefTRIAXone (ROCEPHIN) 2 g in sodium chloride 0.9 % 100 mL IVPB  Status:  Discontinued  2 g 200 mL/hr over 30 Minutes Intravenous Daily 02/08/23 0942 02/10/23 0921   02/08/23 0845  levofloxacin (LEVAQUIN) IVPB 500 mg        500 mg 100 mL/hr over 60 Minutes Intravenous  Once 02/08/23  0841 02/08/23 1046       Subjective: Patient seen and examined at bedside today.  Hemodynamically stable.  Looks much better than yesterday.  Denies any worsening shortness of breath or cough.  Diarrhoea has stopped  Objective: Vitals:   02/10/23 0900 02/10/23 0915 02/10/23 1000 02/10/23 1100  BP: 138/87  123/62 (!) 111/56  Pulse: 95  97 98  Resp: (!) 22  18 (!) 5  Temp:      TempSrc:      SpO2: 98% 94% (!) 88% 92%  Weight:      Height:        Intake/Output Summary (Last 24 hours) at 02/10/2023 1149 Last data filed at 02/10/2023 0557 Gross per 24 hour  Intake 343.1 ml  Output 850 ml  Net -506.9 ml   Filed Weights   02/08/23 0635  Weight: 72.1 kg    Examination:   General exam: Overall comfortable, not in distress HEENT: PERRL Respiratory system: Mildly diminished sounds bilaterally, no wheezes or crackles  Cardiovascular system: S1 & S2 heard, RRR.  Gastrointestinal system: Abdomen is nondistended, soft and nontender. Central nervous system: Alert and oriented Extremities: No edema, no clubbing ,no cyanosis Skin: No rashes, no ulcers,no icterus      Data Reviewed: I have personally reviewed following labs and imaging studies  CBC: Recent Labs  Lab 02/08/23 0639 02/09/23 0853 02/10/23 0320  WBC 13.2* 14.0* 13.6*  NEUTROABS 10.9*  --   --   HGB 11.8* 12.1* 11.4*  HCT 36.0* 38.9* 36.1*  MCV 88.7 93.1 90.3  PLT 211 163 [id number]   Basic Metabolic Panel: Recent Labs  Lab 02/08/23 0639 02/09/23 0853  NA 137 138  K 3.7 4.2  CL 110 109  CO2 20* 22  GLUCOSE 124* 83  BUN 18 18  CREATININE 1.01 0.89  CALCIUM 8.6* 8.4*     Recent Results (from the past 240 hour(s))  MRSA Next Gen by PCR, Nasal     Status: None   Collection Time: 02/08/23  5:45 AM   Specimen: Nasal Mucosa; Nasal Swab  Result Value Ref Range Status   MRSA by PCR Next Gen NOT DETECTED NOT DETECTED Final    Comment: (NOTE) The GeneXpert MRSA Assay (FDA approved for NASAL specimens  only), is one component of a comprehensive MRSA colonization surveillance program. It is not intended to diagnose MRSA infection nor to guide or monitor treatment for MRSA infections. Test performance is not FDA approved in patients less than 28 years old. Performed at [facility], [place] [address] [place], [place] [address]   Resp panel by RT-PCR (RSV, Flu A&B, Covid) Anterior Nasal Swab     Status: None   Collection Time: 02/08/23  6:45 AM   Specimen: Anterior Nasal Swab  Result Value Ref Range Status   SARS Coronavirus 2 by RT PCR NEGATIVE NEGATIVE Final    Comment: (NOTE) SARS-CoV-2 target nucleic acids are NOT DETECTED.  The SARS-CoV-2 RNA is generally detectable in upper respiratory specimens during the acute phase of infection. The lowest concentration of SARS-CoV-2 viral copies this assay can detect is 138 copies/mL. A negative result does not preclude SARS-Cov-2 infection and should not be used as the sole basis for treatment or other patient  management decisions. A negative result may occur with  improper specimen collection/handling, submission of specimen other than nasopharyngeal swab, presence of viral mutation(s) within the areas targeted by this assay, and inadequate number of viral copies(<138 copies/mL). A negative result must be combined with clinical observations, patient history, and epidemiological information. The expected result is Negative.  Fact Sheet for Patients:  EntrepreneurPulse.com.au  Fact Sheet for Healthcare Providers:  IncredibleEmployment.be  This test is no t yet approved or cleared by the [place] FDA and  has been authorized for detection and/or diagnosis of SARS-CoV-2 by FDA under an Emergency Use Authorization (EUA). This EUA will remain  in effect (meaning this test can be used) for the duration of the COVID-19 declaration under Section 564(b)(1) of the Act, 21 U.S.C.section  360bbb-3(b)(1), unless the authorization is terminated  or revoked sooner.       Influenza A by PCR NEGATIVE NEGATIVE Final   Influenza B by PCR NEGATIVE NEGATIVE Final    Comment: (NOTE) The Xpert Xpress SARS-CoV-2/FLU/RSV plus assay is intended as an aid in the diagnosis of influenza from Nasopharyngeal swab specimens and should not be used as a sole basis for treatment. Nasal washings and aspirates are unacceptable for Xpert Xpress SARS-CoV-2/FLU/RSV testing.  Fact Sheet for Patients: EntrepreneurPulse.com.au  Fact Sheet for Healthcare Providers: IncredibleEmployment.be  This test is not yet approved or cleared by the [place] FDA and has been authorized for detection and/or diagnosis of SARS-CoV-2 by FDA under an Emergency Use Authorization (EUA). This EUA will remain in effect (meaning this test can be used) for the duration of the COVID-19 declaration under Section 564(b)(1) of the Act, 21 U.S.C. section 360bbb-3(b)(1), unless the authorization is terminated or revoked.     Resp Syncytial Virus by PCR NEGATIVE NEGATIVE Final    Comment: (NOTE) Fact Sheet for Patients: EntrepreneurPulse.com.au  Fact Sheet for Healthcare Providers: IncredibleEmployment.be  This test is not yet approved or cleared by the [place] FDA and has been authorized for detection and/or diagnosis of SARS-CoV-2 by FDA under an Emergency Use Authorization (EUA). This EUA will remain in effect (meaning this test can be used) for the duration of the COVID-19 declaration under Section 564(b)(1) of the Act, 21 U.S.C. section 360bbb-3(b)(1), unless the authorization is terminated or revoked.  Performed at [facility], [address], [place], [place] [address]   Blood culture (routine x 2)     Status: None (Preliminary result)   Collection Time: 02/08/23  6:45 AM   Specimen: BLOOD  Result Value Ref Range Status    Specimen Description   Final    BLOOD BLOOD RIGHT ARM Performed at [facility], [address] [place], [place] [address]    Special Requests   Final    Blood Culture adequate volume Performed at [facility], [address], [place], [place] [address]    Culture  Setup Time   Final    GRAM NEGATIVE RODS AEROBIC BOTTLE ONLY Gram Stain Report Called to,Read Back By and Verified With: ETHEN POLLET '@0831'$  02/09/2023 BY T KENNEDY CRITICAL VALUE NOTED.  VALUE IS CONSISTENT WITH PREVIOUSLY REPORTED AND CALLED VALUE. Performed at [facility], [place] [address], [place], [place] [address]    Culture GRAM NEGATIVE RODS  Final   Report Status PENDING  Incomplete  Blood culture (routine x 2)     Status: Abnormal   Collection Time: 02/08/23  6:50 AM   Specimen: BLOOD  Result Value Ref Range Status   Specimen Description  BLOOD BLOOD LEFT HAND  Final   Special Requests Blood Culture adequate volume  Final   Culture  Setup Time   Final    GRAM NEGATIVE RODS ANAEROBIC BOTTLE ONLY Gram Stain Report Called to,Read Back By and Verified With: ETHEN POLLET AT 0831 02/09/2023 BY T KENNEDY Organism ID to follow CRITICAL RESULT CALLED TO, READ BACK BY AND VERIFIED WITH:  C/ PHARMD C. SHADE 02/09/23 1420 A. [place]FR[place]    Culture HAEMOPHILUS INFLUENZAE BETA LACTAMASE NEGATIVE  (A)  Final   Report Status 02/10/2023 FINAL  Final  Blood Culture ID Panel (Reflexed)     Status: Abnormal   Collection Time: 02/08/23  6:50 AM  Result Value Ref Range Status   Enterococcus faecalis NOT DETECTED NOT DETECTED Final   Enterococcus Faecium NOT DETECTED NOT DETECTED Final   Listeria monocytogenes NOT DETECTED NOT DETECTED Final   Staphylococcus species NOT DETECTED NOT DETECTED Final   Staphylococcus aureus (BCID) NOT DETECTED NOT DETECTED Final   Staphylococcus epidermidis NOT DETECTED NOT DETECTED Final   Staphylococcus lugdunensis NOT DETECTED NOT DETECTED Final   Streptococcus species NOT DETECTED NOT  DETECTED Final   Streptococcus agalactiae NOT DETECTED NOT DETECTED Final   Streptococcus pneumoniae NOT DETECTED NOT DETECTED Final   Streptococcus pyogenes NOT DETECTED NOT DETECTED Final   A.calcoaceticus-baumannii NOT DETECTED NOT DETECTED Final   Bacteroides fragilis NOT DETECTED NOT DETECTED Final   Enterobacterales NOT DETECTED NOT DETECTED Final   Enterobacter cloacae complex NOT DETECTED NOT DETECTED Final   Escherichia coli NOT DETECTED NOT DETECTED Final   Klebsiella aerogenes NOT DETECTED NOT DETECTED Final   Klebsiella oxytoca NOT DETECTED NOT DETECTED Final   Klebsiella pneumoniae NOT DETECTED NOT DETECTED Final   Proteus species NOT DETECTED NOT DETECTED Final   Salmonella species NOT DETECTED NOT DETECTED Final   Serratia marcescens NOT DETECTED NOT DETECTED Final   Haemophilus influenzae DETECTED (A) NOT DETECTED Final    Comment: CRITICAL RESULT CALLED TO, READ BACK BY AND VERIFIED WITH:  C/ PHARMD C. SHADE 02/09/23 1420 A. [place]FR[place]    Neisseria meningitidis NOT DETECTED NOT DETECTED Final   Pseudomonas aeruginosa NOT DETECTED NOT DETECTED Final   Stenotrophomonas maltophilia NOT DETECTED NOT DETECTED Final   Candida albicans NOT DETECTED NOT DETECTED Final   Candida auris NOT DETECTED NOT DETECTED Final   Candida glabrata NOT DETECTED NOT DETECTED Final   Candida krusei NOT DETECTED NOT DETECTED Final   Candida parapsilosis NOT DETECTED NOT DETECTED Final   Candida tropicalis NOT DETECTED NOT DETECTED Final   Cryptococcus neoformans/gattii NOT DETECTED NOT DETECTED Final    Comment: Performed at [facility], [place] [address] [place], [place] [address]  C Difficile Quick Screen w PCR reflex     Status: None   Collection Time: 02/09/23  8:02 AM   Specimen: STOOL  Result Value Ref Range Status   C Diff antigen NEGATIVE NEGATIVE Final   C Diff toxin NEGATIVE NEGATIVE Final   C Diff interpretation No C. difficile detected.  Final    Comment: Performed at  [facility], [place] [address], [place], [place] [address]  Gastrointestinal Panel by PCR , Stool     Status: Abnormal   Collection Time: 02/09/23  8:03 AM   Specimen: STOOL  Result Value Ref Range Status   Campylobacter species NOT DETECTED NOT DETECTED Final   Plesimonas shigelloides NOT DETECTED NOT DETECTED Final   Salmonella species NOT DETECTED NOT DETECTED Final   Yersinia enterocolitica  NOT DETECTED NOT DETECTED Final   Vibrio species NOT DETECTED NOT DETECTED Final   Vibrio cholerae NOT DETECTED NOT DETECTED Final   Enteroaggregative E coli (EAEC) NOT DETECTED NOT DETECTED Final   Enteropathogenic E coli (EPEC) NOT DETECTED NOT DETECTED Final   Enterotoxigenic E coli (ETEC) NOT DETECTED NOT DETECTED Final   Shiga like toxin producing E coli (STEC) NOT DETECTED NOT DETECTED Final   Shigella/Enteroinvasive E coli (EIEC) NOT DETECTED NOT DETECTED Final   Cryptosporidium NOT DETECTED NOT DETECTED Final   Cyclospora cayetanensis NOT DETECTED NOT DETECTED Final   Entamoeba histolytica NOT DETECTED NOT DETECTED Final   Giardia lamblia NOT DETECTED NOT DETECTED Final   Adenovirus F40/41 NOT DETECTED NOT DETECTED Final   Astrovirus NOT DETECTED NOT DETECTED Final   Norovirus GI/GII DETECTED (A) NOT DETECTED Final    Comment: RESULT CALLED TO, READ BACK BY AND VERIFIED WITH: AMANDA WATSON RN 02/09/23 2138 MU    Rotavirus A NOT DETECTED NOT DETECTED Final   Sapovirus (I, II, IV, and V) NOT DETECTED NOT DETECTED Final    Comment: Performed at [facility], [address], [place], [place] [address]     Radiology Studies: No results found.  Scheduled Meds:  [START ON 02/11/2023] amoxicillin-clavulanate  1 tablet Oral Q12H   arformoterol  15 mcg Nebulization BID   budesonide (PULMICORT) nebulizer solution  0.5 mg Nebulization BID   Chlorhexidine Gluconate Cloth  6 each Topical Q0600   vitamin B-12  1,000 mcg Oral Daily   enoxaparin (LOVENOX) injection  40  mg Subcutaneous Q24H   ferrous sulfate  325 mg Oral Q breakfast   gabapentin  900 mg Oral TID   guaiFENesin-dextromethorphan  10 mL Oral Q6H   insulin aspart  0-5 Units Subcutaneous QHS   insulin aspart  0-9 Units Subcutaneous TID WC   insulin aspart  2 Units Subcutaneous TID WC   insulin aspart  3 Units Subcutaneous TID WC   insulin glargine-yfgn  5 Units Subcutaneous Daily   methylPREDNISolone (SOLU-MEDROL) injection  40 mg Intravenous Q12H   mirtazapine  7.5 mg Oral QHS   multivitamin with minerals  1 tablet Oral Daily   Ensure Max Protein  11 oz Oral BID   revefenacin  175 mcg Nebulization Daily   rosuvastatin  20 mg Oral QHS   sodium chloride flush  3 mL Intravenous Q12H   sodium chloride HYPERTONIC  4 mL Nebulization Daily   Continuous Infusions:  sodium chloride       LOS: 2 days   Shelly Coss, MD [facility] P2/27/2024, 11:49 AM

## 2023-02-10 NOTE — Progress Notes (Signed)
RT note: In to see pt. for Q4 schedule CPT, is asleep in no apparent distress, currently remains on HFNC/[place] device '@15'$  lpm which has been titrated up from to help maintain oxygen saturations per order of >92% from 4 lpm up to currently at 15 lpm, this RT in with pt. previously @ 2037 on Monday, 2/26 administering scheduled BID,(2X/day)aerosol nebulizer tx. and pt. preferred to hold nebulizer aerosol mask and declined to place strap over head stating, "does not want it tight on face". RT to monitor.

## 2023-02-10 NOTE — Inpatient Diabetes Management (Signed)
Inpatient Diabetes Program Recommendations  AACE/ADA: New Consensus Statement on Inpatient Glycemic Control  Target Ranges:  Prepandial:   less than 140 mg/dL      Peak postprandial:   less than 180 mg/dL (1-2 hours)      Critically ill patients:  140 - 180 mg/dL    Latest Reference Range & Units 02/09/23 07:54 02/09/23 12:35 02/09/23 16:04 02/09/23 21:34 02/10/23 08:09  Glucose-Capillary 70 - 99 mg/dL 72 147 (H) 246 (H) 303 (H) 309 (H)   Review of Glycemic Control  Diabetes history: Dm2 Outpatient Diabetes medications: Glipizide XL 10 mg QAM, Metformin 1000 mg BID Current orders for Inpatient glycemic control: Novolog 0-9 units TID with meals, Novolog 0-5 units QHS, Novolog 2 units TID with meals; Solumedrol 40 mg Q12H  Inpatient Diabetes Program Recommendations:    Insulin: If steroids are continued as ordered, please consider ordering Semglee 5 units Q24H and increasing meal coverage to Novolog 3 units TID with meals if patient eats at least 50% of meals. Once steroids are tapered, insulin orders will need to be adjusted as well.  Thanks, Barnie Alderman, RN, MSN, [place] Diabetes Coordinator Inpatient Diabetes Program [phone] (Team Pager from 8am to [place])

## 2023-02-10 NOTE — TOC Initial Note (Signed)
Transition of Care [facility]) - Initial/Assessment Note    Patient Details  Name: Daryl Vega MRN: [id number] Date of Birth: Aug 19, 1946  [facility]) CM/SW Contact:    Roseanne Kaufman, RN Phone Number: 02/10/2023, 6:41 PM  Clinical Narrative:     Per chart review patient from home alone, wife recently passed. PT  has recommended HHPT, patient currently on HFNC.   TOC will continue to follow for needs.               Expected Discharge Plan: [place] Barriers to Discharge: Continued Medical Work up   Patient Goals and CMS Choice            Expected Discharge Plan and Services   Discharge Planning Services: CM Consult                               HH Arranged: PT          Prior Living Arrangements/Services     Patient language and need for interpreter reviewed:: Yes                 Activities of Daily Living Home Assistive Devices/Equipment: Cane (specify quad or straight), Walker (specify type) (as needed per pt) ADL Screening (condition at time of admission) Patient's cognitive ability adequate to safely complete daily activities?: Yes Is the patient deaf or have difficulty hearing?: No Does the patient have difficulty seeing, even when wearing glasses/contacts?: No Does the patient have difficulty concentrating, remembering, or making decisions?: No Patient able to express need for assistance with ADLs?: Yes Does the patient have difficulty dressing or bathing?: No Independently performs ADLs?: Yes (appropriate for developmental age) Does the patient have difficulty walking or climbing stairs?: Yes Weakness of Legs: Both Weakness of Arms/Hands: Both  Permission Sought/Granted                  Emotional Assessment              Admission diagnosis:  Sepsis ([place]) [A41.9] Community acquired pneumonia of right lower lobe of lung [J18.9] Patient Active Problem List   Diagnosis Date Noted   Sepsis  ([place]) 02/08/2023   Chronic respiratory failure with hypoxia ([place]) 12/03/2022   Aspiration pneumonia ([place]) 05/02/2020   Chest pain in adult 05/02/2020   Hallucination 09/27/2015   Antineoplastic chemotherapy induced anemia 09/26/2015   Anemia of chronic disease 09/26/2015   Dyslipidemia associated with type 2 diabetes mellitus ([place]) 09/26/2015   Type 2 diabetes, controlled, 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[place]) 09/26/2015   Polypharmacy 09/25/2015   Recurrent falls 09/25/2015   Pulmonary embolism ([place]) 01/06/2015   Enteritis due to Clostridium difficile 12/19/2014   Protein-calorie malnutrition, severe ([place]) 11/30/2014   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 11/30/2014   Bleeding duodenal ulcer s/p ex lap/oversew 11/30/2014 11/30/2014   Lung cancer ([place]) 07/28/2014   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 and BI 50% obst  07/20/2014   COPD mixed type ([place]) 06/28/2014   PCP:  Nelly Laurence, NP Pharmacy:   [facility] [address] [place] [address] [place] [address] Phone: [phone] Fax: [phone]     Social Determinants of Health (SDOH) Social History: SDOH Screenings   Food Insecurity: No Food Insecurity (02/08/2023)  Housing: Low Risk  (02/08/2023)  Transportation Needs: No Transportation Needs (02/08/2023)  Utilities: Not  At Risk (02/08/2023)  Tobacco Use: Medium Risk (02/09/2023)   SDOH Interventions:     Readmission Risk Interventions     No data to display

## 2023-02-10 NOTE — Progress Notes (Signed)
Patient O2 sats in low 80's while asleep and mouth breathing. This nurse went to assess the patient and asked him if I could put the NRB mask so he can recover. Patient became angry and said to this nurse "don't put your damn hands on me, I don't need your damn help with nothing." This nurse asked patient's son to explain how the mask could help him breath better and the son hesitated while the patient continued to yell at me and cursing. This nurse left the room and told the son, son's girlfriend, and the patient that I would have RT come into the room to try to redirect the patient and educate. RT notified while making rounds.

## 2023-02-10 NOTE — Progress Notes (Signed)
MD notified, NT checked 1200 CBG and BS is [phone]. Lab orders placed to verify.

## 2023-02-10 NOTE — Progress Notes (Signed)
Held cpt at this time. Pt is out of bed in the chair.

## 2023-02-10 NOTE — Evaluation (Signed)
Physical Therapy Evaluation Patient Details Name: Daryl Vega [facility] MRN: [id number] DOB: January 10, 1946 Today's Date: 02/10/2023  History of Present Illness  Patient is a 50 male with history of COPD, chronic hypoxic failure on 2 L of oxygen, lung cancer status post chemo/radiation, diabetes type 2, PE who presented to emergency department at [facility] 02/08/25 with shortness of breath, diarrhea, fever, chills, febrile, tachycardic, tachypneic.  Labs showed leukocytosis, Severe sepsis secondary to right-sided pneumonia/gram-negative bacteremia  Clinical Impression  Pt admitted with above diagnosis.  Pt currently with functional limitations due to the deficits listed below (see PT Problem List). Pt will benefit from skilled PT to increase their independence and safety with mobility to allow discharge to the venue listed below.      The  patient reports plans to return to his home. Patient has Home O2 available. Patient should progress to return home with intermittent support. Patient reports that his wife passed away prior to his coming to hospital.   Recommendations for follow up therapy are one component of a multi-disciplinary discharge planning process, led by the attending physician.  Recommendations may be updated based on patient status, additional functional criteria and insurance authorization.  Follow Up Recommendations Home health PT      Assistance Recommended at Discharge Set up Supervision/Assistance  Patient can return home with the following  Assistance with cooking/housework;Assist for transportation    Equipment Recommendations None recommended by PT  Recommendations for Other Services       Functional Status Assessment Patient has had a recent decline in their functional status and demonstrates the ability to make significant improvements in function in a reasonable and predictable amount of time.     Precautions / Restrictions Precautions Precautions:  Fall Precaution Comments: on home O2, enteric      Mobility  Bed Mobility Overal bed mobility: Independent                  Transfers Overall transfer level: Independent                      Ambulation/Gait Ambulation/Gait assistance: Min guard Gait Distance (Feet): 5 Feet Assistive device: None Gait Pattern/deviations: Step-to pattern Gait velocity: decr     General Gait Details: transferred self to recliner, assist for lines  Stairs            Wheelchair Mobility    Modified Rankin (Stroke Patients Only)       Balance Overall balance assessment: Mild deficits observed, not formally tested                                           Pertinent Vitals/Pain Pain Assessment Pain Assessment: No/denies pain    Home Living Family/patient expects to be discharged to:: Private residence Living Arrangements: Alone Available Help at Discharge: Family;Available PRN/intermittently Type of Home: Mobile home Home Access: Stairs to enter Entrance Stairs-Rails: Right Entrance Stairs-Number of Steps: 4   Home Layout: One level Home Equipment: Conservation officer, nature (2 wheels) Additional Comments: wife passed away  1 week before admission    Prior Function Prior Level of Function : Independent/Modified Independent             Mobility Comments: drives       Hand Dominance   Dominant Hand: Left    Extremity/Trunk Assessment   Upper Extremity Assessment Upper Extremity Assessment: Overall  WFL for tasks assessed    Lower Extremity Assessment Lower Extremity Assessment: Overall WFL for tasks assessed    Cervical / Trunk Assessment Cervical / Trunk Assessment: Normal  Communication   Communication: No difficulties  Cognition Arousal/Alertness: Awake/alert Behavior During Therapy: WFL for tasks assessed/performed Overall Cognitive Status: Within Functional Limits for tasks assessed                                           General Comments      Exercises     Assessment/Plan    PT Assessment Patient needs continued PT services  PT Problem List Decreased strength;Decreased mobility;Cardiopulmonary status limiting activity       PT Treatment Interventions DME instruction;Therapeutic activities;Gait training;Therapeutic exercise;Patient/family education    PT Goals (Current goals can be found in the Care Plan section)  Acute Rehab PT Goals Patient Stated Goal: go home PT Goal Formulation: With patient Time For Goal Achievement: 02/24/23 Potential to Achieve Goals: Good    Frequency Min 3X/week     Co-evaluation               AM-PAC PT "6 Clicks" Mobility  Outcome Measure Help needed turning from your back to your side while in a flat bed without using bedrails?: None Help needed moving from lying on your back to sitting on the side of a flat bed without using bedrails?: None Help needed moving to and from a bed to a chair (including a wheelchair)?: None Help needed standing up from a chair using your arms (e.g., wheelchair or bedside chair)?: A Little Help needed to walk in hospital room?: A Little Help needed climbing 3-5 steps with a railing? : A Little 6 Click Score: 21    End of Session Equipment Utilized During Treatment: Oxygen Activity Tolerance: Patient limited by fatigue;Patient tolerated treatment well Patient left: in chair;with chair alarm set Nurse Communication: Mobility status PT Visit Diagnosis: Unsteadiness on feet (R26.81);Muscle weakness (generalized) (M62.81);Other symptoms and signs involving the nervous system (R29.898)    Time: [id number] PT Time Calculation (min) (ACUTE ONLY): 28 min   Charges:   PT Evaluation $PT Eval Low Complexity: 1 Low PT Treatments $Therapeutic Activity: 8-22 mins        Tresa Endo PT Acute Rehabilitation Services Office [phone] Weekend [id number]   Claretha Cooper 02/10/2023, 1:17 PM

## 2023-02-10 NOTE — Progress Notes (Signed)
This nurse went into patient's room to administer bedtime meds, patient refused PO liquid guaifenesin. Patient said " I'm still wearing this mess on my gown since earlier today and I ain't taking a damn thang." This nurse offered to change his gown, patient said "don't put your damn hands on me." This nurse educated the patient on the importance of taking his meds. NT asked by this nurse to offer linen change, bath, and gown change. Other meds where accepted by the patient.

## 2023-02-11 DIAGNOSIS — E440 Moderate protein-calorie malnutrition: Secondary | ICD-10-CM | POA: Insufficient documentation

## 2023-02-11 DIAGNOSIS — J449 Chronic obstructive pulmonary disease, unspecified: Secondary | ICD-10-CM | POA: Diagnosis not present

## 2023-02-11 LAB — BASIC METABOLIC PANEL
Anion gap: 7 (ref 5–15)
BUN: 28 mg/dL — ABNORMAL HIGH (ref 8–23)
CO2: 23 mmol/L (ref 22–32)
Calcium: 8.3 mg/dL — ABNORMAL LOW (ref 8.9–10.3)
Chloride: 106 mmol/L (ref 98–111)
Creatinine, Ser: 1.02 mg/dL (ref 0.61–1.24)
GFR, Estimated: >60 mL/min (ref >60)
Glucose, Bld: 400 mg/dL — ABNORMAL HIGH (ref 70–99)
Potassium: 4.2 mmol/L (ref 3.5–5.1)
Sodium: 136 mmol/L (ref 135–145)

## 2023-02-11 LAB — CBC
HCT: 32.4 % — ABNORMAL LOW (ref 39.0–52.0)
Hemoglobin: 10.1 g/dL — ABNORMAL LOW (ref 13.0–17.0)
MCH: 28.5 pg (ref 26.0–34.0)
MCHC: 31.2 g/dL (ref 30.0–36.0)
MCV: 91.3 fL (ref 80.0–100.0)
Platelets: 178 10*3/uL (ref 150–400)
RBC: 3.55 MIL/uL — ABNORMAL LOW (ref 4.22–5.81)
RDW: 17.2 % — ABNORMAL HIGH (ref 11.5–15.5)
WBC: 14.9 10*3/uL — ABNORMAL HIGH (ref 4.0–10.5)
nRBC: 0 % (ref 0.0–0.2)

## 2023-02-11 LAB — GLUCOSE, CAPILLARY
Glucose-Capillary: 349 mg/dL — ABNORMAL HIGH (ref 70–99)
Glucose-Capillary: 272 mg/dL — ABNORMAL HIGH (ref 70–99)
Glucose-Capillary: 370 mg/dL — ABNORMAL HIGH (ref 70–99)
Glucose-Capillary: 162 mg/dL — ABNORMAL HIGH (ref 70–99)
Glucose-Capillary: 250 mg/dL — ABNORMAL HIGH (ref 70–99)
Glucose-Capillary: 239 mg/dL — ABNORMAL HIGH (ref 70–99)

## 2023-02-11 MED ORDER — DOCUSATE SODIUM 100 MG PO CAPS
100.0000 mg | ORAL_CAPSULE | Freq: Every day | ORAL | Status: DC
  Administered 2023-02-11 – 2023-02-15 (×4): 100 mg via ORAL
  Filled 2023-02-11 (×5): qty 1

## 2023-02-11 MED ORDER — INSULIN ASPART 100 UNIT/ML IJ SOLN
10.0000 [IU] | Freq: Once | INTRAMUSCULAR | Status: AC
  Administered 2023-02-11: 10 [IU] via SUBCUTANEOUS

## 2023-02-11 MED ORDER — PHENOL 1.4 % MT LIQD
1.0000 | OROMUCOSAL | Status: DC | PRN
  Filled 2023-02-11: qty 177

## 2023-02-11 MED ORDER — ALUM & MAG HYDROXIDE-SIMETH 200-200-20 MG/5ML PO SUSP
30.0000 mL | ORAL | Status: DC | PRN
  Administered 2023-02-11: 30 mL via ORAL
  Filled 2023-02-11: qty 30

## 2023-02-11 NOTE — Progress Notes (Addendum)
[facility] PROGRESS NOTE  Daryl Vega (DOB: 1946/09/02) [id number] PCP: Nelly Laurence, NP  Brief Narrative: Patient is a 30 male with history of COPD, chronic hypoxic failure on 2 L of oxygen, lung cancer status post chemo/radiation, diabetes type 2, PE who presented to emergency department at [facility] with complaints of shortness of breath, diarrhea, fever, chills.  On presentation he was febrile, tachycardic, tachypneic.  Labs showed leukocytosis.  Patient was requiring high amount of oxygen than his baseline.  Transferred to [facility]  hospital because there was no available stepdown bed at [facility]. Blood culture showed haemophilus influenza.  Respiratory status improving, though remains dependent on 6L O2. Steroids initiated for wheezing but stopped with severe hyperglycemia.   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 His breathing is better overall but still severely dyspneic with exertion still desaturating even on 6L O2. No wheezing reported.   Objective: BP 110/69 (BP Location: Left Arm)   Pulse (!) 112   Temp (!) 97.5 F (36.4 C) (Oral)   Resp 16   Ht '5\' 8"'$  (1.727 m)   Wt 72.1 kg   SpO2 91%   BMI 24.18 kg/m   Gen: Chronically ill-appearing pleasant male in no distress Pulm: Distant without crackles or wheezes, nonlabored at rest.   CV: RRR, no MRG or edema GI: Soft, NT, ND, +BS  Neuro: Alert and oriented. No new focal deficits. 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 influenzae bacteremia: Presented with fever, dyspnea, tachypnea, leukocytosis, lactic acidosis.  Chest imaging showed right-sided consolidation suspicious for pneumonia.  Blood cultures showed haemophilus  influenza.  Currently afebrile, blood pressure stable.   - Continue augmentin, plan for 10 days course total   Acute on chronic hypoxic respiratory failure/COPD exacerbation: Secondary to pneumonia.  - Continue weaning efforts. Currently 6L O2 whereas baseline is 2L  - Chest PT.  - Was wheezing, started on steroids but stopped due to severe hyperglycemia. Wheezing remains resolved.   - Continue Candiss Norse, Pulmicort, prn duonebs.  Norovirus gastroenteritis: Improved symptoms.   History of lung cancer: Status post chemo/radiation.  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 Also showed nonspecific mediastinal/supraclavicular lymphadenopathy. Needs to rule out recurrence/metastasis.   - PET/CT recommended in outpatient. Needs to follow-up with his oncologist after discharge, follows at [facility]   Normocytic anemia: Currently hemoglobin stable.  - Iron studies showed low iron, started on oral supplementation.   - Vitamin B-12 found to be low, given IM vitamin B-12 and started oral supplementation.  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 Labs confirm no DKA.  - Augmented SSI.  Added glargine. Will monitor to deescalate now that steroids held.   History of hyperlipidemia:  - Continue statin   Weakness : Sometimes uses walker, cane for ambulation. PT /OT consulted > plan to return home with home health once medically stabilized.   Moderate protein calorie malnutrition: Supplement protein as able.  Patrecia Pour, MD [facility] [email or web address] 02/11/2023, 2:12 PM

## 2023-02-11 NOTE — Progress Notes (Signed)
Pt says "Im ready to leave". I educated pt needing oxygen since he is currently on 6L [place] & he normally wears 2L [place] at home. Pt says "I got oxygen at home & I don't want nobody touching me, I can do things myself." Pt refused assistance when transferring to bsc. Dr. Bonner Puna aware. Pt educated about condition & unsafe to leave hospital in current state. Pt unstable to discharge. Pt desat while working with PT. Dr. Bonner Puna will educate pt at bedside. Pt is AxOx4

## 2023-02-11 NOTE — Evaluation (Addendum)
Occupational Therapy Evaluation Patient Details Name: Daryl Vega [facility] MRN: [id number] DOB: October 03, 1946 Today's Date: 02/11/2023   History of Present Illness Patient is a 8 male with history of COPD, chronic hypoxic failure on 2 L of oxygen, lung cancer status post chemo/radiation, diabetes type 2, PE who presented to emergency department at [facility] 02/08/25 with shortness of breath, diarrhea, fever, chills, febrile, tachycardic, tachypneic.  Labs showed leukocytosis, Severe sepsis secondary to right-sided pneumonia/gram-negative bacteremia   Clinical Impression   Patient is a 77 year old male who was admitted for above. Patient was living at home alone with recent loss of wife. Patient was noted to have impulsivity with movements with increased A needed to mange lines and safety. Patient noted to have decreased functional activity tolerance on 6L/min with O2 dropping with transfer to recliner. Patient's session was limited with patient needing therapeutic listening with recent loss of wife. Patient's thoughts and feelings were heard and validated. Patient was educated on chaplin services at hospital. Patient verbalized understanding. Patient was noted to have decreased functional activity tolerance, decreased endurance, decreased standing balance, decreased safety awareness, and decreased knowledge of AD/AE impacting participation in ADLs. Patient would continue to benefit from skilled OT services at this time while admitted to address noted deficits in order to improve overall safety and independence in ADLs.       Recommendations for follow up therapy are one component of a multi-disciplinary discharge planning process, led by the attending physician.  Recommendations may be updated based on patient status, additional functional criteria and insurance authorization.   Follow Up Recommendations  No OT follow up     Assistance Recommended at Discharge Intermittent  Supervision/Assistance  Patient can return home with the following Assistance with cooking/housework;Direct supervision/assist for medications management;Direct supervision/assist for financial management;Assist for transportation    Functional Status Assessment  Patient has had a recent decline in their functional status and demonstrates the ability to make significant improvements in function in a reasonable and predictable amount of time.  Equipment Recommendations  None recommended by OT       Precautions / Restrictions Precautions Precautions: Fall Precaution Comments: on home O2, enteric Restrictions Weight Bearing Restrictions: No      Mobility Bed Mobility Overal bed mobility: Independent        Transfers Overall transfer level: Independent          Balance Overall balance assessment: Mild deficits observed, not formally tested    Blood pressure 110/81mhg  (82) HR 95 bpm       ADL either performed or assessed with clinical judgement   ADL Overall ADL's : Needs assistance/impaired Eating/Feeding: Modified independent;Sitting   Grooming: Oral care Grooming Details (indicate cue type and reason): patient was educated on brushing tounge each day. patient declined to use brush but did place some toothpaste in mouth and moved it around and rinsed. Upper Body Bathing: Set up;Sitting   Lower Body Bathing: Set up;Sitting/lateral leans Lower Body Bathing Details (indicate cue type and reason): able to simulate bringing BLE to lap. Upper Body Dressing : Set up;Sitting   Lower Body Dressing: Minimal assistance;Sit to/from stand   Toilet Transfer: Minimal assistance Toilet Transfer Details (indicate cue type and reason): to transfer from edge of bed to recliner in room with patient standing impuslively and getting to recliner prior to lines and leads beign finished untagled. O2 cord noted to have twisted out of nose with O2 drpping to 70%. O2 was immediately replaced  in nose on  6L/min with O2 able to deep breath up to 90% on RA within 45 seconds. patient was educated on importance of taking transfers slow and beign safe with movements. patient verbalized understanding. Toileting- Clothing Manipulation and Hygiene: Minimal assistance;Sit to/from stand               Vision Baseline Vision/History: 1 Wears glasses Vision Assessment?: No apparent visual deficits            Pertinent Vitals/Pain Pain Assessment Pain Assessment: No/denies pain     Hand Dominance Left   Extremity/Trunk Assessment Upper Extremity Assessment Upper Extremity Assessment: Overall WFL for tasks assessed   Lower Extremity Assessment Lower Extremity Assessment: Defer to PT evaluation   Cervical / Trunk Assessment Cervical / Trunk Assessment: Normal   Communication Communication Communication: No difficulties   Cognition Arousal/Alertness: Awake/alert Behavior During Therapy: WFL for tasks assessed/performed Overall Cognitive Status: Within Functional Limits for tasks assessed         General Comments: noted to be more expressive about greif today and confrontations with recently deceased wife's family.                Home Living Family/patient expects to be discharged to:: Private residence Living Arrangements: Alone Available Help at Discharge: Family;Available PRN/intermittently Type of Home: Mobile home Home Access: Stairs to enter Entrance Stairs-Number of Steps: 4 Entrance Stairs-Rails: Right Home Layout: One level     Bathroom Shower/Tub: Other (comment) (sponge baths)   Bathroom Toilet: Standard     Home Equipment: Conservation officer, nature (2 wheels)   Additional Comments: wife passed away  1 week before admission      Prior Functioning/Environment Prior Level of Function : Independent/Modified Independent             Mobility Comments: drives          OT Problem List: Decreased strength;Decreased activity tolerance;Impaired  balance (sitting and/or standing);Decreased safety awareness;Decreased knowledge of use of DME or AE;Cardiopulmonary status limiting activity      OT Treatment/Interventions: Self-care/ADL training;Therapeutic exercise;Energy conservation;Balance training;Patient/family education;Cognitive remediation/compensation    OT Goals(Current goals can be found in the care plan section) Acute Rehab OT Goals Patient Stated Goal: to get out of bed. OT Goal Formulation: With patient Time For Goal Achievement: 02/25/23 Potential to Achieve Goals: Fair  OT Frequency: Min 2X/week       AM-PAC OT "6 Clicks" Daily Activity     Outcome Measure Help from another person eating meals?: None Help from another person taking care of personal grooming?: A Little Help from another person toileting, which includes using toliet, bedpan, or urinal?: A Little Help from another person bathing (including washing, rinsing, drying)?: A Little Help from another person to put on and taking off regular upper body clothing?: A Little Help from another person to put on and taking off regular lower body clothing?: A Little 6 Click Score: 19   End of Session Equipment Utilized During Treatment: Oxygen Nurse Communication: Other (comment) (impulsiveness during session)  Activity Tolerance: Patient tolerated treatment well Patient left: in chair;with call bell/phone within reach;with chair alarm set  OT Visit Diagnosis: Unsteadiness on feet (R26.81);Other abnormalities of gait and mobility (R26.89);Muscle weakness (generalized) (M62.81)                Time: [id number] OT Time Calculation (min): 25 min Charges:  OT General Charges $OT Visit: 1 Visit OT Evaluation $OT Eval Moderate Complexity: 1 Mod OT Treatments $Self Care/Home Management : 8-22 mins Kyley Laurel OTR/L, MS Acute  Rehabilitation Department Office# [phone]   Willa Rough 02/11/2023, 1:10 PM

## 2023-02-11 NOTE — Progress Notes (Signed)
CPT held-pt in chair

## 2023-02-12 ENCOUNTER — Inpatient Hospital Stay (HOSPITAL_COMMUNITY): Payer: Medicare Other

## 2023-02-12 ENCOUNTER — Encounter (HOSPITAL_COMMUNITY): Payer: Self-pay | Admitting: Internal Medicine

## 2023-02-12 DIAGNOSIS — J432 Centrilobular emphysema: Secondary | ICD-10-CM | POA: Diagnosis not present

## 2023-02-12 DIAGNOSIS — J9621 Acute and chronic respiratory failure with hypoxia: Secondary | ICD-10-CM

## 2023-02-12 DIAGNOSIS — J189 Pneumonia, unspecified organism: Secondary | ICD-10-CM | POA: Diagnosis not present

## 2023-02-12 DIAGNOSIS — J90 Pleural effusion, not elsewhere classified: Secondary | ICD-10-CM | POA: Diagnosis not present

## 2023-02-12 DIAGNOSIS — T17998A Other foreign object in respiratory tract, part unspecified causing other injury, initial encounter: Secondary | ICD-10-CM

## 2023-02-12 LAB — BLOOD GAS, VENOUS
Acid-Base Excess: 5.6 mmol/L — ABNORMAL HIGH (ref 0.0–2.0)
Bicarbonate: 31.6 mmol/L — ABNORMAL HIGH (ref 20.0–28.0)
O2 Saturation: 37.9 %
Patient temperature: 38.3
pCO2, Ven: 54 mmHg (ref 44–60)
pH, Ven: 7.38 (ref 7.25–7.43)
pO2, Ven: <31 mmHg — CL (ref 32–45)

## 2023-02-12 LAB — LACTATE DEHYDROGENASE, PLEURAL OR PERITONEAL FLUID: LD, Fluid: 161 U/L — ABNORMAL HIGH (ref 3–23)

## 2023-02-12 LAB — GLUCOSE, CAPILLARY
Glucose-Capillary: 167 mg/dL — ABNORMAL HIGH (ref 70–99)
Glucose-Capillary: 139 mg/dL — ABNORMAL HIGH (ref 70–99)
Glucose-Capillary: 170 mg/dL — ABNORMAL HIGH (ref 70–99)
Glucose-Capillary: 173 mg/dL — ABNORMAL HIGH (ref 70–99)

## 2023-02-12 LAB — CBC
HCT: 37.8 % — ABNORMAL LOW (ref 39.0–52.0)
Hemoglobin: 11.6 g/dL — ABNORMAL LOW (ref 13.0–17.0)
MCH: 28.1 pg (ref 26.0–34.0)
MCHC: 30.7 g/dL (ref 30.0–36.0)
MCV: 91.5 fL (ref 80.0–100.0)
Platelets: 210 10*3/uL (ref 150–400)
RBC: 4.13 MIL/uL — ABNORMAL LOW (ref 4.22–5.81)
RDW: 17.3 % — ABNORMAL HIGH (ref 11.5–15.5)
WBC: 11.4 10*3/uL — ABNORMAL HIGH (ref 4.0–10.5)
nRBC: 0 % (ref 0.0–0.2)

## 2023-02-12 LAB — BASIC METABOLIC PANEL
Anion gap: 8 (ref 5–15)
BUN: 20 mg/dL (ref 8–23)
CO2: 27 mmol/L (ref 22–32)
Calcium: 8.3 mg/dL — ABNORMAL LOW (ref 8.9–10.3)
Chloride: 105 mmol/L (ref 98–111)
Creatinine, Ser: 0.83 mg/dL (ref 0.61–1.24)
GFR, Estimated: >60 mL/min (ref >60)
Glucose, Bld: 52 mg/dL — ABNORMAL LOW (ref 70–99)
Potassium: 2.8 mmol/L — ABNORMAL LOW (ref 3.5–5.1)
Sodium: 140 mmol/L (ref 135–145)

## 2023-02-12 LAB — TROPONIN I (HIGH SENSITIVITY): Troponin I (High Sensitivity): 33 ng/L — ABNORMAL HIGH (ref <18)

## 2023-02-12 LAB — GLUCOSE, PLEURAL OR PERITONEAL FLUID: Glucose, Fluid: 141 mg/dL

## 2023-02-12 LAB — BODY FLUID CELL COUNT WITH DIFFERENTIAL
Eos, Fluid: 0 %
Lymphs, Fluid: 22 %
Monocyte-Macrophage-Serous Fluid: 26 % — ABNORMAL LOW (ref 50–90)
Neutrophil Count, Fluid: 52 % — ABNORMAL HIGH (ref 0–25)
Total Nucleated Cell Count, Fluid: 1812 cu mm — ABNORMAL HIGH (ref 0–1000)

## 2023-02-12 LAB — PROTEIN, PLEURAL OR PERITONEAL FLUID: Total protein, fluid: 3.5 g/dL

## 2023-02-12 LAB — BRAIN NATRIURETIC PEPTIDE: B Natriuretic Peptide: 647.8 pg/mL — ABNORMAL HIGH (ref 0.0–100.0)

## 2023-02-12 LAB — LEGIONELLA PNEUMOPHILA SEROGP 1 UR AG: L. pneumophila Serogp 1 Ur Ag: NEGATIVE

## 2023-02-12 LAB — EXPECTORATED SPUTUM ASSESSMENT W GRAM STAIN, RFLX TO RESP C

## 2023-02-12 LAB — PROCALCITONIN: Procalcitonin: 1.49 ng/mL

## 2023-02-12 MED ORDER — MENTHOL 3 MG MT LOZG
1.0000 | LOZENGE | OROMUCOSAL | Status: DC | PRN
  Administered 2023-02-12: 3 mg via ORAL
  Filled 2023-02-12: qty 9

## 2023-02-12 MED ORDER — POTASSIUM CHLORIDE CRYS ER 20 MEQ PO TBCR
40.0000 meq | EXTENDED_RELEASE_TABLET | Freq: Three times a day (TID) | ORAL | Status: AC
  Administered 2023-02-12 (×2): 40 meq via ORAL
  Filled 2023-02-12 (×2): qty 2

## 2023-02-12 MED ORDER — SODIUM CHLORIDE (PF) 0.9 % IJ SOLN
INTRAMUSCULAR | Status: AC
  Administered 2023-02-13: 3 mL via INTRAVENOUS
  Filled 2023-02-12: qty 50

## 2023-02-12 MED ORDER — ALBUTEROL SULFATE (2.5 MG/3ML) 0.083% IN NEBU: 2.5000 mg | INHALATION_SOLUTION | RESPIRATORY_TRACT | Status: DC | PRN

## 2023-02-12 MED ORDER — SODIUM CHLORIDE 3 % IN NEBU
4.0000 mL | INHALATION_SOLUTION | Freq: Two times a day (BID) | RESPIRATORY_TRACT | Status: AC
  Administered 2023-02-12 – 2023-02-15 (×6): 4 mL via RESPIRATORY_TRACT
  Filled 2023-02-12 (×6): qty 4

## 2023-02-12 MED ORDER — IOHEXOL 350 MG/ML SOLN
100.0000 mL | Freq: Once | INTRAVENOUS | Status: AC | PRN
  Administered 2023-02-12: 75 mL via INTRAVENOUS

## 2023-02-12 MED ORDER — GUAIFENESIN ER 600 MG PO TB12
1200.0000 mg | ORAL_TABLET | Freq: Two times a day (BID) | ORAL | Status: DC
  Administered 2023-02-12 – 2023-02-16 (×7): 1200 mg via ORAL
  Filled 2023-02-12 (×8): qty 2

## 2023-02-12 NOTE — Progress Notes (Signed)
[facility] PROGRESS NOTE  Daryl Vega (DOB: [birth year]) [id number] PCP: Nelly Laurence, NP  Brief Narrative: Patient is a 59 male with history of COPD, chronic hypoxic failure on 2 L of oxygen, lung cancer status post chemo/radiation, diabetes type 2, PE who presented to emergency department at [facility] with complaints of shortness of breath, diarrhea, fever, chills.  On presentation he was febrile, tachycardic, tachypneic.  Labs showed leukocytosis.  Patient was requiring high amount of oxygen than his baseline.  Transferred to [facility]  hospital because there was no available stepdown bed at [facility]. Blood culture showed haemophilus influenza.  Respiratory status improving, though remains dependent on 6L O2. Steroids initiated for wheezing but stopped with severe hyperglycemia.   On 2/29, hypoxia worsened. CTA chest ordered stat. Initially planning transfer to the floor, though the patient will remain in SDU. Full code.  Subjective: Irritable about being in the hospital. Spoke with him about criteria for discharge. Says he's leaving tomorrow regardless. No new dyspnea or chest pain or palpitations or leg swelling. He's now on HFNC.  Objective: BP 130/79   Pulse 88   Temp (!) 101.9 F (38.8 C) (Oral)   Resp (!) 23   Ht '5\' 8"'$  (1.727 m)   Wt 72.1 kg   SpO2 93%   BMI 24.18 kg/m   Gen: No distress, chronically ill-appearing.  Pulm: Tachypneic without wheezing. R base crackles/rhonchi stable.   CV: RRR, no MRG, JVD, or edema GI: Soft, NT, ND, +BS  Neuro: Alert and oriented. No new focal deficits. Ext: Warm, no new deformities. Skin: No new rashes, lesions or ulcers on visualized skin   Assessment & Plan: Severe sepsis secondary to right-sided pneumonia, H. influenzae bacteremia: Presented with fever, dyspnea, tachypnea, leukocytosis, lactic acidosis.  Chest imaging showed right-sided consolidation suspicious for pneumonia.  Blood cultures showed haemophilus  influenza.  Currently afebrile, blood pressure stable.   - Continue antibiotics, plan for 10 days course total. H. flu is beta lactamase negative, so could narrow, though with clinical worsening, we will recheck sputum culture, blood cultures, and keep current abx. Would have to broaden if recurrently develops sepsis.    Acute on chronic hypoxic respiratory failure/COPD exacerbation: Secondary to pneumonia.  - Hypoxia has worsened in past few hours per RN. Check CTA chest stat. Also check CBC, BMP, troponin, BNP, PCT, VBG, sputum and blood cultures. Not currently wheezing, so not restarting steroids. If we did, we'd need to augment insulin regimen considerably.  - Continue Candiss Norse, Pulmicort, prn duonebs.  Norovirus gastroenteritis: Improved symptoms.   History of lung cancer: Status post chemo/radiation.  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 Also showed nonspecific mediastinal/supraclavicular lymphadenopathy. Needs to rule out recurrence/metastasis.   - PET/CT recommended in outpatient. Needs to follow-up with his oncologist after discharge, follows at [facility]    Normocytic anemia: Currently hemoglobin stable.  - Iron studies showed low iron, started on oral supplementation.   - Vitamin B-12 found to be low, given IM vitamin B-12 and started oral supplementation.  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 Labs confirm no DKA.  - Augmented SSI.  Added glargine. Will stay on this for now.   History of hyperlipidemia:  - Continue statin   Weakness : Sometimes uses walker, cane for ambulation. PT /OT consulted > plan to return home with home health once medically stabilized.   Moderate protein calorie malnutrition: Supplement protein as able.  Patrecia Pour, MD [facility] [email or web address] 02/12/2023, 12:50 PM

## 2023-02-12 NOTE — Progress Notes (Signed)
Physical Therapy Treatment Patient Details Name: Daryl Vega [facility] MRN: [id number] DOB: 10/26/1946 Today's Date: 02/12/2023   History of Present Illness Patient is a 7 male with history of COPD, chronic hypoxic failure on 2 L of oxygen, lung cancer status post chemo/radiation, diabetes type 2, PE who presented to emergency department at [facility] 02/08/25 with shortness of breath, diarrhea, fever, chills, febrile, tachycardic, tachypneic.  Labs showed leukocytosis, Severe sepsis secondary to right-sided pneumonia/gram-negative bacteremia    PT Comments    General Comments: AxO x 3 following all instructions.  Expressing some frustrations.  "I thinlk they are giving me too much oxygen".  Pt is on 2 lts HOME oxygen "ever since my Cancer". Pt also having multiple episodes of loose stools today.  Assisted OOB to [facility] first as a precaution.  General bed mobility comments: pt self able only needing assist with mutiple lines  General transfer comment: First assist to [facility] NO AD 1/4 turn using hands to steady self.  Second, assisted off BSC to amb with walker.  Pt prefers to pull himself up with B hands on walker despite repeat instructions to push up "from a solid surface".  "I got this", stated pt.  No physical assist needed. General Gait Details: used a walker this session for safety.  Prior to admit, pt did NOT use any AD.  Amb 50 feet on 15L HFNC avg sats 92% and HR 128 with RR 44 Therapist ceased activity and allowed a seated rest breaks and instructed on purse lip breathing.  Pt trying to out perfrom. 5 min seated rest break before amb back to his room another 50 feet this time with instruction to decrease gait speed and purse lip breath vs "mouth breathing".  HR avg 119, sats 94% and RR no >35. Positioned in recliner to comfort with mutiple pillows. Pt plans to D/C back home with family support.     Recommendations for follow up therapy are one component of a multi-disciplinary discharge  planning process, led by the attending physician.  Recommendations may be updated based on patient status, additional functional criteria and insurance authorization.  Follow Up Recommendations  Home health PT     Assistance Recommended at Discharge Set up Supervision/Assistance  Patient can return home with the following Assistance with cooking/housework;Assist for transportation   Equipment Recommendations  None recommended by PT    Recommendations for Other Services       Precautions / Restrictions Precautions Precautions: Fall Precaution Comments: on home O2, enteric Restrictions Weight Bearing Restrictions: No     Mobility  Bed Mobility Overal bed mobility: Modified Independent             General bed mobility comments: pt self able only needing assist with mutiple lines    Transfers Overall transfer level: Needs assistance Equipment used: None, Rolling walker (2 wheels) Transfers: Sit to/from Stand, Bed to chair/wheelchair/BSC Sit to Stand: Min guard, Supervision Stand pivot transfers: Supervision, Min guard         General transfer comment: First assist to [facility] NO AD 1/4 turn using hands to steady self.  Second, assisted off BSC to amb with walker.  Pt prefers to pull himself up with B hands on walker despite repeat instructions to push up "from a solid surface".  "I got this", stated pt.  No physical assist needed.    Ambulation/Gait Ambulation/Gait assistance: Min guard, Min assist Gait Distance (Feet): 100 Feet (50 feet x 2 with one seated rest  break) Assistive  device: Rolling walker (2 wheels) Gait Pattern/deviations: Step-to pattern, Step-through pattern, Trunk flexed Gait velocity: impulsive     General Gait Details: used a walker this session for safety.  Prior to admit, pt did NOT use any AD.  Amb 50 feet on 15L HFNC avg sats 92% and HR 128 with RR 44 Therapist ceased activity and allowed a seated rest breaks and instructed on purse lip  breathing.  Pt trying to out perfrom. 5 min seated rest break before amb back to his room another 50 feet this time with instruction to decrease gait speed and purse lip breath vs "mouth breathing".  HR avg 119, sats 94% and RR no >35.   Stairs             Wheelchair Mobility    Modified Rankin (Stroke Patients Only)       Balance                                            Cognition Arousal/Alertness: Awake/alert Behavior During Therapy: WFL for tasks assessed/performed Overall Cognitive Status: Within Functional Limits for tasks assessed                                 General Comments: AxO x 3 following all instructions.  Expressing some frustrations.  "I thinlk they are giving me too much oxygen".  Pt also having multiple episodes of loose stools today.        Exercises      General Comments        Pertinent Vitals/Pain Pain Assessment Pain Assessment: No/denies pain    Home Living                          Prior Function            PT Goals (current goals can now be found in the care plan section) Progress towards PT goals: Progressing toward goals    Frequency    Min 3X/week      PT Plan Current plan remains appropriate    Co-evaluation              AM-PAC PT "6 Clicks" Mobility   Outcome Measure  Help needed turning from your back to your side while in a flat bed without using bedrails?: None Help needed moving from lying on your back to sitting on the side of a flat bed without using bedrails?: None Help needed moving to and from a bed to a chair (including a wheelchair)?: None Help needed standing up from a chair using your arms (e.g., wheelchair or bedside chair)?: A Little Help needed to walk in hospital room?: A Little Help needed climbing 3-5 steps with a railing? : A Little 6 Click Score: 21    End of Session Equipment Utilized During Treatment: Gait belt Activity Tolerance:  Patient tolerated treatment well Patient left: in chair;with chair alarm set;with call bell/phone within reach Nurse Communication: Mobility status PT Visit Diagnosis: Unsteadiness on feet (R26.81);Muscle weakness (generalized) (M62.81);Other symptoms and signs involving the nervous system (G40.102)     Time: 7253-6644 PT Time Calculation (min) (ACUTE ONLY): 26 min  Charges:  $Gait Training: 8-22 mins $Therapeutic Activity: 8-22 mins  Rica Koyanagi  PTA Acute  Rehabilitation Services Office M-F          [phone] Weekend pager [phone]

## 2023-02-12 NOTE — Consult Note (Signed)
NAME:  Daryl Vega, MRN:  [id number], DOB:  November 09, 1946, LOS: 4 ADMISSION DATE:  02/08/2023, CONSULTATION DATE:  02/12/2023 REFERRING MD:  Dr. Bonner Puna, [facility], CHIEF COMPLAINT:  Short of breath   History of Present Illness:  77 yo male former smoker followed by Dr. Elsworth Soho for COPD and chronic respiratory failure on 2 liters oxygen, Stage 3b NSCLC in 2015 s/p XRT to Rt upper lobe presented to [facility] ER on 2/25 with temp 100.61F, nausea/vomiting and diarrhea.  Found to have Rt sided pneumonia and positive for norovirus.  Blood culture positive for Haemophilus influenzae.  CT chest showed progressive Rt pleural effusion and occlusion of Rt main bronchus.  PCCM consulted to assist with respiratory management, mucus plugging and assess pleural effusion.  Pertinent  Medical History  Anemia, C diff, Cholecystoduodenal fistula, COPD, DM, Duodenal ulcer, Gastroparesis, HLD, Melanoma, Lung cancer, PE 2018  Significant Hospital Events: Including procedures, antibiotic start and stop dates in addition to other pertinent events   2/25 Admit, start ABx  Studies:  CT chest 2/25 >> RUL XRT fibrosis, consolidation in Rt infrahilar and RLL, area of consolidation in lingula, band like scarring Rt lung base, moderate emphysema CT angio chest 2/29 >> nearly occluded Rt main bronchus, large Rt pleural effusion, small Lt effusion  Interim History / Subjective:  He didn't remember having radiation.  He feels his breathing is better compared to admission.  Still has cough with congestion.  No chest pain.  Objective   Blood pressure 131/65, pulse (!) 109, temperature (!) 101.9 F (38.8 C), temperature source Oral, resp. rate (!) 23, height '5\' 8"'$  (1.727 m), weight 72.1 kg, SpO2 96 %.        Intake/Output Summary (Last 24 hours) at 02/12/2023 1501 Last data filed at 02/12/2023 1200 Gross per 24 hour  Intake --  Output 1250 ml  Net -1250 ml   Filed Weights   02/08/23 [id number]  Weight: 72.1 kg     Examination:  General - sleepy, sitting in a chair Eyes - pupils reactive ENT - no sinus tenderness, no stridor Cardiac - regular rate/rhythm, no murmur Chest - decreased BS on Rt Abdomen - soft, non tender, + bowel sounds Extremities - no cyanosis, clubbing, or edema Skin - no rashes Neuro - normal strength, moves extremities, follows commands Psych - normal mood and behavior  Resolved Hospital Problem list     Assessment & Plan:   Acute on chronic respiratory failure with hypoxia. - in setting of CAP, mucus plugging, pleural effusion, COPD, and XRT fibrosis - goal SpO2 > 90%  Community acquired pneumonia of Rt lung with Haemophilus influenza bacteremia. - day 5 of ABx per primary team  Mucus plugging of Rt side. - adjust bronchial hygiene - f/u CXR - if persists, might need bronchoscopy  Right pleural effusion. - assess with bedside ultrasound for thoracentesis  COPD with emphysema. - brovana, pulmicort, yupelri - prn albuterol  Best Practice (right click and "Reselect all SmartList Selections" daily)   Diet/type: Regular consistency (see orders) DVT prophylaxis: LMWH GI prophylaxis: N/A Lines: N/A Foley:  N/A Code Status:  full code Last date of multidisciplinary goals of care discussion '[x]'$   Labs   CBC: Recent Labs  Lab 02/08/23 0639 02/09/23 0853 02/10/23 0320 02/11/23 0314 02/12/23 1350  WBC 13.2* 14.0* 13.6* 14.9* 11.4*  NEUTROABS 10.9*  --   --   --   --   HGB 11.8* 12.1* 11.4* 10.1* 11.6*  HCT 36.0* 38.9* 36.1* 32.4* 37.8*  MCV 88.7 93.1 90.3 91.3 91.5  PLT 211 163 177 178 [id number]    Basic Metabolic Panel: Recent Labs  Lab 02/08/23 0639 02/09/23 0853 02/10/23 1320 02/10/23 1810 02/11/23 0314  NA 137 138 135 134* 136  K 3.7 4.2 3.8 3.5 4.2  CL 110 109 105 104 106  CO2 20* '22 24 23 23  '$ GLUCOSE 124* 83 433* 419* 400*  BUN '18 18 21 '$ 27* 28*  CREATININE 1.01 0.89 0.95 1.04 1.02  CALCIUM 8.6* 8.4* 8.4* 8.2* 8.3*   GFR: Estimated  Creatinine Clearance: 60.5 mL/min (by C-G formula based on SCr of 1.02 mg/dL). Recent Labs  Lab 02/08/23 0645 02/08/23 0901 02/09/23 0853 02/10/23 0320 02/11/23 0314 02/12/23 1350  WBC  --   --  14.0* 13.6* 14.9* 11.4*  LATICACIDVEN 1.2 2.0*  --   --   --   --     Liver Function Tests: Recent Labs  Lab 02/08/23 0639  AST 59*  ALT 39  ALKPHOS 146*  BILITOT 1.2  PROT 6.5  ALBUMIN 3.1*   Recent Labs  Lab 02/08/23 0639  LIPASE 35   No results for input(s): "AMMONIA" in the last 168 hours.  ABG    Component Value Date/Time   PHART 7.341 (L) 12/01/2014 0910   PCO2ART 27.6 (L) 12/01/2014 0910   PO2ART 128.0 (H) 12/01/2014 0910   HCO3 31.6 (H) 02/12/2023 1350   TCO2 29 05/02/2020 1857   ACIDBASEDEF 9.7 (H) 12/01/2014 0910   O2SAT 37.9 02/12/2023 1350     Coagulation Profile: No results for input(s): "INR", "PROTIME" in the last 168 hours.  Cardiac Enzymes: No results for input(s): "CKTOTAL", "CKMB", "CKMBINDEX", "TROPONINI" in the last 168 hours.  HbA1C: Hgb A1c MFr Bld  Date/Time Value Ref Range Status  02/08/2023 09:42 AM 7.1 (H) 4.8 - 5.6 % Final    Comment:    (NOTE)         Prediabetes: 5.7 - 6.4         Diabetes: >6.4         Glycemic control for adults with diabetes: <7.0   05/03/2020 09:12 AM 8.3 (H) 4.8 - 5.6 % Final    Comment:    (NOTE) Pre diabetes:          5.7%-6.4% Diabetes:              >6.4% Glycemic control for   <7.0% adults with diabetes     CBG: Recent Labs  Lab 02/11/23 1133 02/11/23 1549 02/11/23 2230 02/12/23 0842 02/12/23 1221  GLUCAP 250* 162* 239* 167* 139*    Review of Systems:   Reviewed and negative  Past Medical History:  He,  has a past medical history of Anemia, C. difficile enteritis, Cholecystoduodenal fistula, COPD (chronic obstructive pulmonary disease) ([place]), DM (diabetes mellitus) ([place]), Duodenal ulcer, Elevated LFTs, FH: chemotherapy, Gastroparesis (02/09/2015), Hypercholesteremia, Lung cancer ([place]),  Melanoma ([place]), Pneumonia, Protein calorie malnutrition ([place]), Pulmonary embolus ([place]), and Radiation (08/03/14-08/23/14).   Surgical History:   Past Surgical History:  Procedure Laterality Date   CHOLECYSTECTOMY     duodenostomy tube     ESOPHAGOGASTRODUODENOSCOPY N/A 11/30/2014   Procedure: ESOPHAGOGASTRODUODENOSCOPY (EGD);  Surgeon: [name], MD;  Location: [facility];  Service: Endoscopy;  Laterality: N/A;   FLEXIBLE BRONCHOSCOPY Bilateral 11/25/2022   Procedure: FLEXIBLE BRONCHOSCOPY;  Surgeon: Rigoberto Noel, MD;  Location: [facility];  Service: Pulmonary;  Laterality: Bilateral;   JEJUNOSTOMY FEEDING TUBE     LAPAROTOMY N/A 11/30/2014  Procedure: EXPLORATORY LAPAROTOMY, PYLOROPLASTY, OVERSEWING OF POSTERIOR DUODENUM, DUODENOSTOMY, CHOLECYSTECTOMY, JEJEUNOSTOMY;  Surgeon: Jackolyn Confer, MD;  Location: [facility] ORS;  Service: General;  Laterality: N/A;   PYLOROPLASTY     VIDEO BRONCHOSCOPY Bilateral 07/20/2014   Procedure: VIDEO BRONCHOSCOPY WITHOUT FLUORO;  Surgeon: Tanda Rockers, MD;  Location: [facility];  Service: Cardiopulmonary;  Laterality: Bilateral;     Social History:   reports that he quit smoking about 8 years ago. His smoking use included cigarettes and cigars. He has a 100.00 pack-year smoking history. He quit smokeless tobacco use about 10 years ago.  His smokeless tobacco use included chew. He reports that he does not drink alcohol and does not use drugs.   Family History:  His family history includes Cancer in his mother; Heart disease in his mother.   Allergies No Known Allergies   Home Medications  Prior to Admission medications   Medication Sig Start Date End Date Taking? Authorizing Provider  acetaminophen (TYLENOL) 325 MG tablet Take 2 tablets (650 mg total) by mouth every 6 (six) hours as needed for mild pain (or Fever >/= 101). 10/01/15  Yes Robbie Lis, MD  albuterol (PROVENTIL) (2.5 MG/3ML) 0.083% nebulizer solution Take 3 mLs (2.5 mg total)  by nebulization every 4 (four) hours as needed for wheezing or shortness of breath. 12/03/22 12/03/23 Yes Rigoberto Noel, MD  albuterol (VENTOLIN HFA) 108 (90 Base) MCG/ACT inhaler Inhale 2 puffs into the lungs every 4 (four) hours as needed. Patient taking differently: Inhale 2 puffs into the lungs every 4 (four) hours as needed for wheezing or shortness of breath. 11/28/22  Yes Emokpae, Courage, MD  gabapentin (NEURONTIN) 300 MG capsule Take 900 mg by mouth 3 (three) times daily.  03/30/20  Yes [provider]  glipiZIDE (GLUCOTROL XL) 10 MG 24 hr tablet Take 1 tablet (10 mg total) by mouth daily with breakfast. 11/28/22  Yes Emokpae, Courage, MD  metFORMIN (GLUCOPHAGE) 1000 MG tablet Take 1 tablet (1,000 mg total) by mouth 2 (two) times daily with a meal. 11/28/22  Yes Emokpae, Courage, MD  mirtazapine (REMERON) 7.5 MG tablet Take 7.5 mg by mouth at bedtime. 11/18/22  Yes [provider]  rosuvastatin (CRESTOR) 20 MG tablet Take 20 mg by mouth at bedtime. 04/04/20  Yes [provider]  traMADol (ULTRAM) 50 MG tablet Take 50 mg by mouth every 6 (six) hours as needed for moderate pain or severe pain.  04/21/20  Yes [provider]  TRELEGY ELLIPTA 100-62.5-25 MCG/ACT AEPB Inhale 1 puff into the lungs daily. 11/10/22  Yes [provider]  nystatin (MYCOSTATIN) 100000 UNIT/ML suspension Take 5 mLs (500,000 Units total) by mouth 4 (four) times daily. Patient not taking: Reported on 02/12/2023 12/03/22   Rigoberto Noel, MD     Signature:  Chesley Mires, MD [place] Pager - [phone] or [phone] 02/12/2023, 3:23 PM

## 2023-02-12 NOTE — Procedures (Signed)
Thoracentesis  Procedure Note  Kalonji Tomassetti [facility]  [id number]  Oct 02, 1946  Date:02/12/23  Time:7:14 PM   Provider Performing:Bryauna Byrum Alfredo Martinez   Procedure: Thoracentesis with imaging guidance [id number])  Indication(s) Pleural Effusion  Consent Risks of the procedure as well as the alternatives and risks of each were explained to the patient and/or caregiver.  Consent for the procedure was obtained and is signed in the bedside chart  Anesthesia Topical only with 1% lidocaine    Time Out Verified patient identification, verified procedure, site/side was marked, verified correct patient position, special equipment/implants available, medications/allergies/relevant history reviewed, required imaging and test results available.   Sterile Technique Maximal sterile technique including full sterile barrier drape, hand hygiene, sterile gown, sterile gloves, mask, hair covering, sterile ultrasound probe cover (if used).  Procedure Description Ultrasound was used to identify appropriate pleural anatomy for placement and overlying skin marked.  Area of drainage cleaned and draped in sterile fashion. Lidocaine was used to anesthetize the skin and subcutaneous tissue.  600 cc's of cloudy amber appearing fluid was drained from the right pleural space. Catheter then removed and bandaid applied to site.   Complications/Tolerance None; patient tolerated the procedure well. Chest X-ray is ordered to confirm no post-procedural complication.   EBL Minimal   Specimen(s) Pleural fluid    Noe Gens, MSN, APRN, NP-C, AGACNP-BC [facility] 02/12/2023, 7:15 PM   Please see [email or web address] for pager details.   From 7A-7P if no response, please call [phone] After hours, please call ELink [phone]

## 2023-02-13 ENCOUNTER — Inpatient Hospital Stay (HOSPITAL_COMMUNITY): Payer: Medicare Other

## 2023-02-13 DIAGNOSIS — J189 Pneumonia, unspecified organism: Secondary | ICD-10-CM | POA: Diagnosis not present

## 2023-02-13 DIAGNOSIS — J90 Pleural effusion, not elsewhere classified: Secondary | ICD-10-CM | POA: Diagnosis not present

## 2023-02-13 LAB — RESPIRATORY PANEL BY PCR

## 2023-02-13 LAB — CBC
HCT: 38 % — ABNORMAL LOW (ref 39.0–52.0)
Hemoglobin: 11.7 g/dL — ABNORMAL LOW (ref 13.0–17.0)
MCH: 28 pg (ref 26.0–34.0)
MCHC: 30.8 g/dL (ref 30.0–36.0)
MCV: 90.9 fL (ref 80.0–100.0)
Platelets: 213 10*3/uL (ref 150–400)
RBC: 4.18 MIL/uL — ABNORMAL LOW (ref 4.22–5.81)
RDW: 17.5 % — ABNORMAL HIGH (ref 11.5–15.5)
WBC: 11.1 10*3/uL — ABNORMAL HIGH (ref 4.0–10.5)
nRBC: 0 % (ref 0.0–0.2)

## 2023-02-13 LAB — BASIC METABOLIC PANEL
Anion gap: 5 (ref 5–15)
BUN: 21 mg/dL (ref 8–23)
CO2: 28 mmol/L (ref 22–32)
Calcium: 8.4 mg/dL — ABNORMAL LOW (ref 8.9–10.3)
Chloride: 104 mmol/L (ref 98–111)
Creatinine, Ser: 0.75 mg/dL (ref 0.61–1.24)
GFR, Estimated: >60 mL/min (ref >60)
Glucose, Bld: 251 mg/dL — ABNORMAL HIGH (ref 70–99)
Potassium: 4 mmol/L (ref 3.5–5.1)
Sodium: 137 mmol/L (ref 135–145)

## 2023-02-13 LAB — RESP PANEL BY RT-PCR (RSV, FLU A&B, COVID)  RVPGX2
Influenza A by PCR: NEGATIVE
Influenza B by PCR: NEGATIVE
Resp Syncytial Virus by PCR: NEGATIVE
SARS Coronavirus 2 by RT PCR: NEGATIVE

## 2023-02-13 LAB — GLUCOSE, CAPILLARY
Glucose-Capillary: 168 mg/dL — ABNORMAL HIGH (ref 70–99)
Glucose-Capillary: 170 mg/dL — ABNORMAL HIGH (ref 70–99)
Glucose-Capillary: 66 mg/dL — ABNORMAL LOW (ref 70–99)
Glucose-Capillary: 135 mg/dL — ABNORMAL HIGH (ref 70–99)
Glucose-Capillary: 152 mg/dL — ABNORMAL HIGH (ref 70–99)

## 2023-02-13 MED ORDER — AMOXICILLIN 500 MG PO CAPS
1000.0000 mg | ORAL_CAPSULE | Freq: Three times a day (TID) | ORAL | Status: DC
  Administered 2023-02-13 – 2023-02-16 (×10): 1000 mg via ORAL
  Filled 2023-02-13 (×11): qty 2

## 2023-02-13 MED ORDER — INSULIN ASPART 100 UNIT/ML IJ SOLN
0.0000 [IU] | Freq: Three times a day (TID) | INTRAMUSCULAR | Status: DC
  Administered 2023-02-14: 5 [IU] via SUBCUTANEOUS
  Administered 2023-02-14: 2 [IU] via SUBCUTANEOUS
  Administered 2023-02-14: 5 [IU] via SUBCUTANEOUS
  Administered 2023-02-15 (×2): 3 [IU] via SUBCUTANEOUS
  Administered 2023-02-15: 8 [IU] via SUBCUTANEOUS
  Administered 2023-02-16: 5 [IU] via SUBCUTANEOUS

## 2023-02-13 MED ORDER — INSULIN ASPART 100 UNIT/ML IJ SOLN
0.0000 [IU] | Freq: Every day | INTRAMUSCULAR | Status: DC
  Administered 2023-02-14: 2 [IU] via SUBCUTANEOUS
  Administered 2023-02-15: 4 [IU] via SUBCUTANEOUS

## 2023-02-13 MED ORDER — SALINE SPRAY 0.65 % NA SOLN
1.0000 | Freq: Two times a day (BID) | NASAL | Status: DC
  Administered 2023-02-13 – 2023-02-16 (×6): 1 via NASAL
  Filled 2023-02-13: qty 44

## 2023-02-13 MED ORDER — FLUTICASONE PROPIONATE 50 MCG/ACT NA SUSP
1.0000 | Freq: Every day | NASAL | Status: DC
  Administered 2023-02-13 – 2023-02-16 (×4): 1 via NASAL
  Filled 2023-02-13: qty 16

## 2023-02-13 MED ORDER — INSULIN ASPART 100 UNIT/ML IJ SOLN
3.0000 [IU] | Freq: Three times a day (TID) | INTRAMUSCULAR | Status: DC
  Administered 2023-02-14 – 2023-02-15 (×6): 3 [IU] via SUBCUTANEOUS

## 2023-02-13 MED ORDER — INSULIN GLARGINE-YFGN 100 UNIT/ML ~~LOC~~ SOLN
5.0000 [IU] | Freq: Every day | SUBCUTANEOUS | Status: DC
  Administered 2023-02-14 – 2023-02-16 (×3): 5 [IU] via SUBCUTANEOUS
  Filled 2023-02-13 (×3): qty 0.05

## 2023-02-13 NOTE — Progress Notes (Signed)
[facility] PROGRESS NOTE  Linvel Schwenker Melendrez (DOB: October 10, 1946) [id number] PCP: Nelly Laurence, NP  Brief Narrative: Daryl Vega is a 75 male with history of COPD, chronic hypoxic failure on 2 L of oxygen, lung cancer status post chemo/radiation, diabetes type 2, PE who presented to emergency department at [facility] with complaints of shortness of breath, diarrhea, fever, chills.  On presentation he was febrile, tachycardic, tachypneic.  Labs showed leukocytosis.  Patient was requiring high amount of oxygen than his baseline.  Transferred to [facility]  hospital because there was no available stepdown bed at [facility]. Blood culture showed haemophilus influenza.  Respiratory status improving, though remains dependent on 6L O2. Steroids initiated for wheezing but stopped with severe hyperglycemia.   On 2/29, hypoxia worsened. CTA chest shows worsening consolidation and pleural effusion. PCCM consulted, 600cc bloody fluid pulled at time of thoracentesis.    Subjective: Had headache that is relatively chronic, +nasal congestion worsening, wants breakfast, admits to some trouble breathing but still irritable about staying in the hospital. No chest pain.   Objective: BP (!) 145/78   Pulse (!) 107   Temp 97.7 F (36.5 C) (Oral)   Resp (!) 30   Ht '5\' 8"'$  (1.727 m)   Wt 72.1 kg   SpO2 91%   BMI 24.18 kg/m   Gen: No distress, chronically ill-appearing Pulm: diminished on right, coarse without wheezes or crackles  CV: RRR, no MRG GI: Soft, NT, ND, +BS Neuro: Alert and oriented. No new focal deficits. Ext: Warm, no deformities. Skin: No rashes, lesions or ulcers on visualized skin   Assessment & Plan: Severe sepsis secondary to right-sided pneumonia, H. influenzae bacteremia: Presented with fever, dyspnea, tachypnea, leukocytosis, lactic acidosis.  Chest imaging showed right-sided consolidation suspicious for pneumonia.  Blood cultures showed haemophilus influenza.   Currently afebrile, blood pressure stable.   - Continue antibiotics, plan for 10 days course total. H. flu is beta lactamase negative, so could narrow, but will await culture results. PCT still elevated. - Monitor pleural fluid gram stain, culture, cytology, sputum culture, blood cultures.   Acute on chronic hypoxic respiratory failure/COPD exacerbation: Secondary to pneumonia, pleural effusion.  - Hypoxia has worsened in past few hours per RN. Check CTA chest stat. Also check CBC, BMP, troponin, BNP, PCT, VBG, sputum and blood cultures. Not currently wheezing, so not restarting steroids. If we did, we'd need to augment insulin regimen considerably.  - Continue Candiss Norse, Pulmicort, prn duonebs.  - Noting nasal congestion. Unclear whether degree of oxygen supplementation flow is irritating mucosa and/or developing viral infection. will check swabs.   Norovirus gastroenteritis: Improved symptoms.   History of lung cancer: Status post chemo/radiation.  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 Also showed nonspecific mediastinal/supraclavicular lymphadenopathy. Needs to rule out recurrence/metastasis.   - PET/CT recommended in outpatient previously to evaluate for recurrence/metastatic disease. Thoracentesis performed 2/29 with cytology pending. Needs to follow-up with his oncologist after discharge, follows at [facility].    Normocytic anemia: Currently hemoglobin stable.  - Iron studies showed low iron, started on oral supplementation.   - Vitamin B-12 found to be low, given IM vitamin B-12 and started oral supplementation.  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 Labs confirm no DKA.  - Augmented SSI.  Added glargine. Will stay on this for now.   History of hyperlipidemia:  - Continue statin   Weakness : Sometimes uses walker, cane for ambulation.  PT /OT consulted >  plan to return home with home health once medically stabilized.   Moderate protein calorie malnutrition: Supplement protein as able.  Patrecia Pour, MD [facility] [email or web address] 02/13/2023, 7:34 AM

## 2023-02-13 NOTE — Progress Notes (Signed)
CPT held at this time pt refused his back is hurting.

## 2023-02-13 NOTE — Progress Notes (Addendum)
   NAME:  Daryl Vega, MRN:  [id number], DOB:  01-May-1946, LOS: 5 ADMISSION DATE:  02/08/2023, CONSULTATION DATE:  02/12/2023 REFERRING MD:  Dr. Bonner Puna, [facility], CHIEF COMPLAINT:  Short of breath   History of Present Illness:  77 yo male former smoker followed by Dr. Elsworth Soho for COPD and chronic respiratory failure on 2 liters oxygen, Stage 3b NSCLC in 2015 s/p XRT to Rt upper lobe presented to [facility] [facility] ER on 2/25 with temp 100.57F, nausea/vomiting and diarrhea.  Found to have Rt sided pneumonia and positive for norovirus.  Blood culture positive for Haemophilus influenzae.  CT chest showed progressive Rt pleural effusion and occlusion of Rt main bronchus.  PCCM consulted to assist with respiratory management, mucus plugging and assess pleural effusion.  Pertinent  Medical History  Anemia, C diff, Cholecystoduodenal fistula, COPD, DM, Duodenal ulcer, Gastroparesis, HLD, Melanoma, Lung cancer, PE 2018  Significant Hospital Events: Including procedures, antibiotic start and stop dates in addition to other pertinent events   2/25 Admit, start ABx 2/29 Right Thora with 600 ml rusty colored fluid removed   Studies:  CT chest 2/25 >> RUL XRT fibrosis, consolidation in Rt infrahilar and RLL, area of consolidation in lingula, band like scarring Rt lung base, moderate emphysema CT angio chest 2/29 >> nearly occluded Rt main bronchus, large Rt pleural effusion, small Lt effusion  Interim History / Subjective:  Tmax 99.1 O2 down to 7L PT reports nasal stuffiness   Objective   Blood pressure (!) 106/52, pulse (!) 105, temperature 99.1 F (37.3 C), temperature source Axillary, resp. rate (!) 32, height '5\' 8"'$  (1.727 m), weight 72.1 kg, SpO2 95 %.        Intake/Output Summary (Last 24 hours) at 02/13/2023 0850 Last data filed at 02/13/2023 [id number] Gross per 24 hour  Intake 240 ml  Output 3100 ml  Net -2860 ml   Filed Weights   02/08/23 [id number]  Weight: 72.1 kg    Examination: General: cachectic adult  male lying in bed in NAD HEENT: MM pink/moist, anicteric, [place] O2 Neuro: Awake/alert, talks in full paragraphs telling stories, MAE CV: s1s2 RRR, no m/r/g PULM: non-labored at rest, prolonged exp phase, lungs bilaterally diminished R>L, without wheeze  GI: soft, bsx4 active  Extremities: warm/dry, no edema  Skin: no rashes or lesions  PCXR > R airspace disease, limited effusion  Resolved Hospital Problem list     Assessment & Plan:   Acute on chronic respiratory failure with hypoxia. Mucus plugging of Rt side. In setting of CAP, mucus plugging, pleural effusion, COPD, and XRT fibrosis -wean O2 for sats >90% -pulmonary hygiene -IS, mobilize -may need consideration for bronchoscopy  -DNR/DNI  Community acquired pneumonia of Rt lung with Haemophilus influenza bacteremia. -ABX per primary - amoxicillin -follow intermittent CXR   Right pleural effusion. S/p thoracentesis on 2/29, 600 ml rusty fluid obtained. Fluid: TNC 1,812, neutrophils 52. Glucose 141. Suspect exudative process based on LD, TNC.  -follow pleural culture, cytology -worrisome hx with prior 3b NSCLC in 2015 on right   COPD with emphysema. -continue brovana, pulmicort, yupelri -PRN albuterol   Best Practice (right click and "Reselect all SmartList Selections" daily)  Per Primary   Signature:   Noe Gens, MSN, APRN, NP-C, AGACNP-BC [facility] 02/13/2023, 8:50 AM   Please see [email or web address] for pager details.   From 7A-7P if no response, please call [phone] After hours, please call ELink [phone]

## 2023-02-13 NOTE — Evaluation (Signed)
Clinical/Bedside Swallow Evaluation Patient Details  Name: Daryl Vega MRN: [id number] Date of Birth: Jul 12, 1946  Today's Date: 02/13/2023 Time: SLP Start Time (ACUTE ONLY): 28 SLP Stop Time (ACUTE ONLY): 1135 SLP Time Calculation (min) (ACUTE ONLY): 30 min  Past Medical History:  Past Medical History:  Diagnosis Date   Anemia    C. difficile enteritis    Cholecystoduodenal fistula    COPD (chronic obstructive pulmonary disease) ([place])    DM (diabetes mellitus) ([place])    Duodenal ulcer    Elevated LFTs    FH: chemotherapy    Gastroparesis 02/09/2015   Hypercholesteremia    Lung cancer ([place])    Melanoma ([place])    Pneumonia    Protein calorie malnutrition ([place])    Pulmonary embolus ([place])    Radiation 08/03/14-08/23/14   35 gray to right chest   Past Surgical History:  Past Surgical History:  Procedure Laterality Date   CHOLECYSTECTOMY     duodenostomy tube     ESOPHAGOGASTRODUODENOSCOPY N/A 11/30/2014   Procedure: ESOPHAGOGASTRODUODENOSCOPY (EGD);  Surgeon: [name], MD;  Location: [facility];  Service: Endoscopy;  Laterality: N/A;   FLEXIBLE BRONCHOSCOPY Bilateral 11/25/2022   Procedure: FLEXIBLE BRONCHOSCOPY;  Surgeon: Rigoberto Noel, MD;  Location: [facility];  Service: Pulmonary;  Laterality: Bilateral;   JEJUNOSTOMY FEEDING TUBE     LAPAROTOMY N/A 11/30/2014   Procedure: EXPLORATORY LAPAROTOMY, PYLOROPLASTY, OVERSEWING OF POSTERIOR DUODENUM, DUODENOSTOMY, CHOLECYSTECTOMY, JEJEUNOSTOMY;  Surgeon: Jackolyn Confer, MD;  Location: [facility] ORS;  Service: General;  Laterality: N/A;   PYLOROPLASTY     VIDEO BRONCHOSCOPY Bilateral 07/20/2014   Procedure: VIDEO BRONCHOSCOPY WITHOUT FLUORO;  Surgeon: Tanda Rockers, MD;  Location: [facility];  Service: Cardiopulmonary;  Laterality: Bilateral;   HPI:  Per MD notes "77yo M hx lung CA s/p XRT 2L O2-dependence, COPD p/w H. influenza pneumonia and bacteremia. Also norovirus on GI panel. Respiratory status worsening,  increasing consolidation and pleural effusion > PCCM consulted for thoracentesis. DNR/DNI."  Swallow eval ordered. 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 Swallow eval ordered.  Pt admits to issues with swallowing, contributing it to his nasal congestion.  Denies reflux or oropharyngeal issues.    Assessment / Plan / Recommendation  Clinical Impression  Patient presents with functional oropharyngeal swallow ability based on clinical swallow evaluation. No evidence of oropharyngeal dysphagia.  No clinical indications of aspiration or significant dysphagia across all po trials including 3 ounces of water via yale water challenge, 3 extra ounces of water, 3 graham crackers and 2 ounces of applesauce.   Pt is verbose and needed cue to cease talking with food in his mouth.  No negative changes in vitals noted with po intake and swallow/respiratory reciprocity intact.  Given his h/o delayed gastric emptying, GERD and esophageal dysmotlity, recommend he follow esophageal precautions and they were provided in writing to pt using teach back.      Tongue has few areas of white coating - pt denies odynophagia nor lingual discomfort.     No SLP follow up needed. SLP Visit Diagnosis: Dysphagia, unspecified (R13.10)    Aspiration Risk  Mild aspiration risk    Diet Recommendation Regular;Thin liquid   Liquid Administration via: Cup;Straw Medication Administration: Whole meds with liquid Supervision: Patient able to self feed Compensations: Slow rate;Small sips/bites Postural Changes: Seated upright at 90 degrees;Remain upright for at least 30 minutes after po intake    Other  Recommendations  Oral Care Recommendations: Oral care BID    Recommendations for follow up therapy are one component of a multi-disciplinary discharge planning process, led by the attending physician.  Recommendations may be updated based on patient  status, additional functional criteria and insurance authorization.  Follow up Recommendations No SLP follow up      Assistance Recommended at Discharge    Functional Status Assessment Patient has not had a recent decline in their functional status  Frequency and Duration            Prognosis        Swallow Study   General HPI: Per MD notes "77yo M hx lung CA s/p XRT 2L O2-dependence, COPD p/w H. influenza pneumonia and bacteremia. Also norovirus on GI panel. Respiratory status worsening, increasing consolidation and pleural effusion > PCCM consulted for thoracentesis. DNR/DNI."  Swallow eval ordered. 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 Swallow eval ordered.  Pt admits to issues with swallowing, contributing it to his nasal congestion.  Denies reflux or oropharyngeal issues. Type of Study: Bedside Swallow Evaluation Previous Swallow Assessment: see HPI Diet Prior to this Study: Regular;Thin liquids (Level 0) Temperature Spikes Noted: No Respiratory Status: Nasal cannula History of Recent Intubation: No Behavior/Cognition: Alert;Cooperative;Pleasant mood;Other (Comment) (verbose) Oral Cavity Assessment: Within Functional Limits;Other (comment) (slight white coating on tongue) Oral Care Completed by SLP: Yes (brushed pt's denture and had pt brush his tongue/gums) Oral Cavity - Dentition: Dentures, top Vision: Functional for self-feeding Self-Feeding Abilities: Able to feed self Patient Positioning: Upright in bed Baseline Vocal Quality: Normal Volitional Cough: Strong Volitional Swallow: Able to elicit    Oral/Motor/Sensory Function Overall Oral Motor/Sensory Function: Within functional limits   Ice Chips Ice chips: Not tested   Thin Liquid Thin Liquid: Within functional limits    Nectar Thick Nectar Thick Liquid: Not tested   Honey Thick Honey Thick Liquid: Not tested   Puree Puree: Within  functional limits Presentation: Self Fed;Spoon   Solid     Solid: Within functional limits Presentation: Self Fed;Spoon      Macario Golds 02/13/2023,11:45 AM

## 2023-02-13 NOTE — Progress Notes (Signed)
Occupational Therapy Treatment Patient Details Name: Daryl Vega [facility] MRN: [id number] DOB: Jun 26, 1946 Today's Date: 02/13/2023   History of present illness Patient is a 58 male with history of COPD, chronic hypoxic failure on 2 L of oxygen, lung cancer status post chemo/radiation, diabetes type 2, PE who presented to emergency department at [facility] 02/08/25 with shortness of breath, diarrhea, fever, chills, febrile, tachycardic, tachypneic.  Labs showed leukocytosis, Severe sepsis secondary to right-sided pneumonia/gram-negative bacteremia   OT comments  Patient able to don socks and ambulate in room twice with walker x 2. His sat was dropping to mid 80s on 6 l HFNC but pleth signal poor at times. Overall he only needed assistance to manage oxygen and lines as he moves quickly and somewhat unsafely. Once untethered he should do quite well. Will cont to follow acutely .   Recommendations for follow up therapy are one component of a multi-disciplinary discharge planning process, led by the attending physician.  Recommendations may be updated based on patient status, additional functional criteria and insurance authorization.    Follow Up Recommendations  No OT follow up     Assistance Recommended at Discharge PRN  Patient can return home with the following  Assistance with cooking/housework;Assist for transportation   Equipment Recommendations  None recommended by OT    Recommendations for Other Services      Precautions / Restrictions Precautions Precautions: Fall Precaution Comments: on home O2, enteric Restrictions Weight Bearing Restrictions: No       Mobility Bed Mobility Overal bed mobility: Modified Independent                  Transfers Overall transfer level: Needs assistance Equipment used: Rolling walker (2 wheels)   Sit to Stand: Min guard Stand pivot transfers: Min guard               Balance Overall balance assessment: Mild  deficits observed, not formally tested                                         ADL either performed or assessed with clinical judgement   ADL Overall ADL's : Needs assistance/impaired                     Lower Body Dressing: Set up;Sitting/lateral leans Lower Body Dressing Details (indicate cue type and reason): Patient able to don socks at edge of bed             Functional mobility during ADLs: Min guard General ADL Comments: Patient min guard to ambulate around room twice using walker. No physical assistance. He moves quick, picks up the walker at times - needs assistance to manage lines and oxygen    Extremity/Trunk Assessment Upper Extremity Assessment Upper Extremity Assessment: Overall WFL for tasks assessed   Lower Extremity Assessment Lower Extremity Assessment: Overall WFL for tasks assessed   Cervical / Trunk Assessment Cervical / Trunk Assessment: Normal    Vision   Vision Assessment?: No apparent visual deficits   Perception     Praxis      Cognition Arousal/Alertness: Awake/alert Behavior During Therapy: WFL for tasks assessed/performed Overall Cognitive Status: Within Functional Limits for tasks assessed  Exercises      Shoulder Instructions       General Comments O2 sat monitored on 6 L HFN - dropped to 86%. Pleth signal poor on left hand and switched to right hand.    Pertinent Vitals/ Pain       Pain Assessment Pain Assessment: No/denies pain  Home Living                                          Prior Functioning/Environment              Frequency  Min 2X/week        Progress Toward Goals  OT Goals(current goals can now be found in the care plan section)  Progress towards OT goals: Progressing toward goals  Acute Rehab OT Goals Patient Stated Goal: to go home soon OT Goal Formulation: With patient Time For Goal  Achievement: 02/25/23 Potential to Achieve Goals: [place] Discharge plan remains appropriate    Co-evaluation                 AM-PAC OT "6 Clicks" Daily Activity     Outcome Measure   Help from another person eating meals?: None Help from another person taking care of personal grooming?: A Little Help from another person toileting, which includes using toliet, bedpan, or urinal?: A Little Help from another person bathing (including washing, rinsing, drying)?: A Little Help from another person to put on and taking off regular upper body clothing?: A Little Help from another person to put on and taking off regular lower body clothing?: A Little 6 Click Score: 19    End of Session Equipment Utilized During Treatment: Oxygen  OT Visit Diagnosis: Unsteadiness on feet (R26.81);Other abnormalities of gait and mobility (R26.89);Muscle weakness (generalized) (M62.81)   Activity Tolerance Patient tolerated treatment well   Patient Left with bed alarm set;in bed   Nurse Communication Mobility status        Time: [id number] OT Time Calculation (min): 28 min  Charges: OT General Charges $OT Visit: 1 Visit OT Treatments $Self Care/Home Management : 8-22 mins $Therapeutic Activity: 8-22 mins  Gustavo Lah, OTR/L [place]  Office [phone]   Lenward Chancellor 02/13/2023, 2:59 PM

## 2023-02-14 ENCOUNTER — Inpatient Hospital Stay (HOSPITAL_COMMUNITY): Payer: Medicare Other

## 2023-02-14 LAB — GLUCOSE, CAPILLARY
Glucose-Capillary: 121 mg/dL — ABNORMAL HIGH (ref 70–99)
Glucose-Capillary: 217 mg/dL — ABNORMAL HIGH (ref 70–99)
Glucose-Capillary: 232 mg/dL — ABNORMAL HIGH (ref 70–99)
Glucose-Capillary: 220 mg/dL — ABNORMAL HIGH (ref 70–99)

## 2023-02-14 LAB — CULTURE, RESPIRATORY W GRAM STAIN: Gram Stain: NONE SEEN

## 2023-02-14 NOTE — Plan of Care (Signed)
  Problem: Education: Goal: Ability to describe self-care measures that may prevent or decrease complications (Diabetes Survival Skills Education) will improve Outcome: Progressing Goal: Individualized Educational Video(s) Outcome: Progressing   Problem: Coping: Goal: Ability to adjust to condition or change in health will improve Outcome: Progressing   Problem: Fluid Volume: Goal: Ability to maintain a balanced intake and output will improve Outcome: Progressing   Problem: Health Behavior/Discharge Planning: Goal: Ability to identify and utilize available resources and services will improve Outcome: Progressing Goal: Ability to manage health-related needs will improve Outcome: Progressing   Problem: Metabolic: Goal: Ability to maintain appropriate glucose levels will improve Outcome: Progressing   Problem: Nutritional: Goal: Maintenance of adequate nutrition will improve Outcome: Progressing Goal: Progress toward achieving an optimal weight will improve Outcome: Progressing   Problem: Skin Integrity: Goal: Risk for impaired skin integrity will decrease Outcome: Progressing   Problem: Tissue Perfusion: Goal: Adequacy of tissue perfusion will improve Outcome: Progressing   Problem: Education: Goal: Knowledge of General Education information will improve Description: Including pain rating scale, medication(s)/side effects and non-pharmacologic comfort measures Outcome: Progressing   Problem: Health Behavior/Discharge Planning: Goal: Ability to manage health-related needs will improve Outcome: Progressing   Problem: Clinical Measurements: Goal: Ability to maintain clinical measurements within normal limits will improve Outcome: Progressing Goal: Will remain free from infection Outcome: Progressing Goal: Diagnostic test results will improve Outcome: Progressing Goal: Respiratory complications will improve Outcome: Progressing Goal: Cardiovascular complication will  be avoided Outcome: Progressing   Problem: Activity: Goal: Risk for activity intolerance will decrease Outcome: Progressing   Problem: Nutrition: Goal: Adequate nutrition will be maintained Outcome: Progressing   Problem: Coping: Goal: Level of anxiety will decrease Outcome: Progressing   Problem: Elimination: Goal: Will not experience complications related to bowel motility Outcome: Progressing Goal: Will not experience complications related to urinary retention Outcome: Progressing   Problem: Pain Managment: Goal: General experience of comfort will improve Outcome: Progressing   Problem: Safety: Goal: Ability to remain free from injury will improve Outcome: Progressing   Problem: Skin Integrity: Goal: Risk for impaired skin integrity will decrease Outcome: Progressing   Problem: Education: Goal: Knowledge of disease or condition will improve Outcome: Progressing Goal: Knowledge of the prescribed therapeutic regimen will improve Outcome: Progressing Goal: Individualized Educational Video(s) Outcome: Progressing   Problem: Activity: Goal: Ability to tolerate increased activity will improve Outcome: Progressing Goal: Will verbalize the importance of balancing activity with adequate rest periods Outcome: Progressing   Problem: Respiratory: Goal: Ability to maintain a clear airway will improve Outcome: Progressing Goal: Levels of oxygenation will improve Outcome: Progressing Goal: Ability to maintain adequate ventilation will improve Outcome: Progressing

## 2023-02-14 NOTE — Progress Notes (Signed)
[facility] PROGRESS NOTE  Khyran Engberg [place] (DOB: January 15, 1946) [id number] PCP: Nelly Laurence, NP  Brief Narrative: Daryl Vega is a 24 male with history of COPD, chronic hypoxic failure on 2 L of oxygen, lung cancer status post chemo/radiation, diabetes type 2, PE who presented to emergency department at [facility] with complaints of shortness of breath, diarrhea, fever, chills.  On presentation he was febrile, tachycardic, tachypneic.  Labs showed leukocytosis.  Patient was requiring high amount of oxygen than his baseline.  Transferred to [facility]  hospital because there was no available stepdown bed at [facility]. Blood culture showed haemophilus influenza.  Respiratory status improving, though remains dependent on 6L O2. Steroids initiated for wheezing but stopped with severe hyperglycemia.   On 2/29, hypoxia worsened. CTA chest shows worsening consolidation and pleural effusion. PCCM consulted, 600cc bloody fluid pulled at time of thoracentesis.    Subjective: Frustrated with ongoing hypoxia precluding discharge.   Objective: BP 139/79   Pulse 95   Temp 98.5 F (36.9 C) (Axillary)   Resp (!) 26   Ht '5\' 8"'$  (1.727 m)   Wt 72.1 kg   SpO2 (!) 88%   BMI 24.18 kg/m   Gen: Chronically ill-appearing male in no distress Pulm: Diminished on R > L, no wheezing or crackles.  CV: RRR, no edema GI: Soft, NT, ND, +BS  Neuro: Alert and oriented. No new focal deficits. Ext: Warm, no deformities. Skin: No new rashes, lesions or ulcers on visualized skin   Assessment & Plan: Severe sepsis secondary to right-sided pneumonia, H. influenzae bacteremia: Presented with fever, dyspnea, tachypnea, leukocytosis, lactic acidosis.  Chest imaging showed right-sided consolidation suspicious for pneumonia.  Blood cultures showed haemophilus influenza.  Currently afebrile, blood pressure stable.   - Continue antibiotics, plan for 10 days course total. H. flu is beta lactamase  negative, so narrowed to high dose amox. - Monitor pleural fluid gram stain (neg), culture, cytology, sputum culture, blood cultures.   Acute on chronic hypoxic respiratory failure/COPD exacerbation: Secondary to pneumonia, pleural effusion.  - Note last bronch 11/25/2022 by Dr. Elsworth Soho showed nontraversible narrowing in proximal right mainstem bronchus, thick mucus at carina was suctioned off. BAL/brushings showed no definite malignancy, though atypical cells were noted.  - Continue Candiss Norse, Pulmicort, prn duonebs.  - Noting nasal congestion, viral swabs negative.  - Defer need for bronch to PCCM, will continue monitoring thoracentesis culture/cytology.  Norovirus gastroenteritis: Improved symptoms.   History of lung cancer: Status post chemo/radiation.  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 Also showed nonspecific mediastinal/supraclavicular lymphadenopathy. Needs to rule out recurrence/metastasis.   - PET/CT recommended in outpatient previously to evaluate for recurrence/metastatic disease. Thoracentesis performed 2/29 with cytology pending. Needs to follow-up with his oncologist after discharge, follows at [facility]. ?if bronchoscopy is indicated   Normocytic anemia: Currently hemoglobin stable.  - Iron studies showed low iron, started on oral supplementation.   - Vitamin B-12 found to be low, given IM vitamin B-12 and started oral supplementation.  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 Labs confirm no DKA.  - Hypoglycemic after steroid discontinuation, deescalated glargine and SSI.   History of hyperlipidemia:  - Continue statin   Weakness : Sometimes uses walker, cane for ambulation. PT /OT consulted > plan to return home with home health once medically stabilized.   Moderate protein calorie malnutrition: Supplement protein as able.  Patrecia Pour,  MD [facility] [email or web address]  02/14/2023, 1:50 PM

## 2023-02-14 NOTE — Plan of Care (Signed)

## 2023-02-14 NOTE — Progress Notes (Signed)
Pt refused cpt says it hurts his back.

## 2023-02-14 NOTE — Plan of Care (Signed)
Problem: Education: Goal: Ability to describe self-care measures that may prevent or decrease complications (Diabetes Survival Skills Education) will improve 02/14/2023 0053 by Peri Jefferson, RN Outcome: Progressing 02/14/2023 0035 by Peri Jefferson, RN Outcome: Progressing Goal: Individualized Educational Video(s) 02/14/2023 0053 by Peri Jefferson, RN Outcome: Progressing 02/14/2023 0035 by Peri Jefferson, RN Outcome: Progressing   Problem: Coping: Goal: Ability to adjust to condition or change in health will improve 02/14/2023 0053 by Peri Jefferson, RN Outcome: Progressing 02/14/2023 0035 by Peri Jefferson, RN Outcome: Progressing   Problem: Fluid Volume: Goal: Ability to maintain a balanced intake and output will improve 02/14/2023 0053 by Peri Jefferson, RN Outcome: Progressing 02/14/2023 0035 by Peri Jefferson, RN Outcome: Progressing   Problem: Health Behavior/Discharge Planning: Goal: Ability to identify and utilize available resources and services will improve 02/14/2023 0053 by Peri Jefferson, RN Outcome: Progressing 02/14/2023 0035 by Peri Jefferson, RN Outcome: Progressing Goal: Ability to manage health-related needs will improve 02/14/2023 0053 by Peri Jefferson, RN Outcome: Progressing 02/14/2023 0035 by Peri Jefferson, RN Outcome: Progressing   Problem: Metabolic: Goal: Ability to maintain appropriate glucose levels will improve 02/14/2023 0053 by Peri Jefferson, RN Outcome: Progressing 02/14/2023 0035 by Peri Jefferson, RN Outcome: Progressing   Problem: Nutritional: Goal: Maintenance of adequate nutrition will improve 02/14/2023 0053 by Peri Jefferson, RN Outcome: Progressing 02/14/2023 0035 by Peri Jefferson, RN Outcome: Progressing Goal: Progress toward achieving an optimal weight will improve 02/14/2023 0053 by Peri Jefferson, RN Outcome: Progressing 02/14/2023 0035 by Peri Jefferson, RN Outcome: Progressing   Problem: Skin  Integrity: Goal: Risk for impaired skin integrity will decrease 02/14/2023 0053 by Peri Jefferson, RN Outcome: Progressing 02/14/2023 0035 by Peri Jefferson, RN Outcome: Progressing   Problem: Tissue Perfusion: Goal: Adequacy of tissue perfusion will improve 02/14/2023 0053 by Peri Jefferson, RN Outcome: Progressing 02/14/2023 0035 by Peri Jefferson, RN Outcome: Progressing   Problem: Education: Goal: Knowledge of General Education information will improve Description: Including pain rating scale, medication(s)/side effects and non-pharmacologic comfort measures 02/14/2023 0053 by Peri Jefferson, RN Outcome: Progressing 02/14/2023 0035 by Peri Jefferson, RN Outcome: Progressing   Problem: Health Behavior/Discharge Planning: Goal: Ability to manage health-related needs will improve 02/14/2023 0053 by Peri Jefferson, RN Outcome: Progressing 02/14/2023 0035 by Peri Jefferson, RN Outcome: Progressing   Problem: Clinical Measurements: Goal: Ability to maintain clinical measurements within normal limits will improve 02/14/2023 0053 by Peri Jefferson, RN Outcome: Progressing 02/14/2023 0035 by Peri Jefferson, RN Outcome: Progressing Goal: Will remain free from infection 02/14/2023 0053 by Peri Jefferson, RN Outcome: Progressing 02/14/2023 0035 by Peri Jefferson, RN Outcome: Progressing Goal: Diagnostic test results will improve 02/14/2023 0053 by Peri Jefferson, RN Outcome: Progressing 02/14/2023 0035 by Peri Jefferson, RN Outcome: Progressing Goal: Respiratory complications will improve 02/14/2023 0053 by Peri Jefferson, RN Outcome: Progressing 02/14/2023 0035 by Peri Jefferson, RN Outcome: Progressing Goal: Cardiovascular complication will be avoided 02/14/2023 0053 by Peri Jefferson, RN Outcome: Progressing 02/14/2023 0035 by Peri Jefferson, RN Outcome: Progressing   Problem: Activity: Goal: Risk for activity intolerance will decrease 02/14/2023 0053 by Peri Jefferson, RN Outcome: Progressing 02/14/2023 0035 by Peri Jefferson, RN Outcome: Progressing   Problem: Nutrition: Goal: Adequate nutrition will be maintained 02/14/2023 0053 by Peri Jefferson, RN Outcome: Progressing 02/14/2023 0035 by Peri Jefferson, RN Outcome: Progressing  Problem: Coping: Goal: Level of anxiety will decrease 02/14/2023 0053 by Peri Jefferson, RN Outcome: Progressing 02/14/2023 0035 by Peri Jefferson, RN Outcome: Progressing   Problem: Elimination: Goal: Will not experience complications related to bowel motility 02/14/2023 0053 by Peri Jefferson, RN Outcome: Progressing 02/14/2023 0035 by Peri Jefferson, RN Outcome: Progressing Goal: Will not experience complications related to urinary retention 02/14/2023 0053 by Peri Jefferson, RN Outcome: Progressing 02/14/2023 0035 by Peri Jefferson, RN Outcome: Progressing   Problem: Pain Managment: Goal: General experience of comfort will improve 02/14/2023 0053 by Peri Jefferson, RN Outcome: Progressing 02/14/2023 0035 by Peri Jefferson, RN Outcome: Progressing   Problem: Safety: Goal: Ability to remain free from injury will improve 02/14/2023 0053 by Peri Jefferson, RN Outcome: Progressing 02/14/2023 0035 by Peri Jefferson, RN Outcome: Progressing   Problem: Skin Integrity: Goal: Risk for impaired skin integrity will decrease 02/14/2023 0053 by Peri Jefferson, RN Outcome: Progressing 02/14/2023 0035 by Peri Jefferson, RN Outcome: Progressing   Problem: Education: Goal: Knowledge of disease or condition will improve Outcome: Progressing Goal: Knowledge of the prescribed therapeutic regimen will improve Outcome: Progressing Goal: Individualized Educational Video(s) Outcome: Progressing   Problem: Activity: Goal: Ability to tolerate increased activity will improve Outcome: Progressing Goal: Will verbalize the importance of balancing activity with adequate rest periods Outcome: Progressing    Problem: Respiratory: Goal: Ability to maintain a clear airway will improve Outcome: Progressing Goal: Levels of oxygenation will improve Outcome: Progressing Goal: Ability to maintain adequate ventilation will improve Outcome: Progressing

## 2023-02-15 DIAGNOSIS — J189 Pneumonia, unspecified organism: Secondary | ICD-10-CM | POA: Diagnosis not present

## 2023-02-15 LAB — CBC
HCT: 40.7 % (ref 39.0–52.0)
Hemoglobin: 12.7 g/dL — ABNORMAL LOW (ref 13.0–17.0)
MCH: 27.9 pg (ref 26.0–34.0)
MCHC: 31.2 g/dL (ref 30.0–36.0)
MCV: 89.3 fL (ref 80.0–100.0)
Platelets: 291 10*3/uL (ref 150–400)
RBC: 4.56 MIL/uL (ref 4.22–5.81)
RDW: 17 % — ABNORMAL HIGH (ref 11.5–15.5)
WBC: 12.6 10*3/uL — ABNORMAL HIGH (ref 4.0–10.5)
nRBC: 0 % (ref 0.0–0.2)

## 2023-02-15 LAB — GLUCOSE, CAPILLARY
Glucose-Capillary: 190 mg/dL — ABNORMAL HIGH (ref 70–99)
Glucose-Capillary: 256 mg/dL — ABNORMAL HIGH (ref 70–99)
Glucose-Capillary: 157 mg/dL — ABNORMAL HIGH (ref 70–99)
Glucose-Capillary: 306 mg/dL — ABNORMAL HIGH (ref 70–99)

## 2023-02-15 LAB — BASIC METABOLIC PANEL
Anion gap: 7 (ref 5–15)
BUN: 23 mg/dL (ref 8–23)
CO2: 27 mmol/L (ref 22–32)
Calcium: 8.4 mg/dL — ABNORMAL LOW (ref 8.9–10.3)
Chloride: 102 mmol/L (ref 98–111)
Creatinine, Ser: 0.76 mg/dL (ref 0.61–1.24)
GFR, Estimated: >60 mL/min (ref >60)
Glucose, Bld: 180 mg/dL — ABNORMAL HIGH (ref 70–99)
Potassium: 3.8 mmol/L (ref 3.5–5.1)
Sodium: 136 mmol/L (ref 135–145)

## 2023-02-15 LAB — PROCALCITONIN: Procalcitonin: 0.35 ng/mL

## 2023-02-15 MED ORDER — DICLOFENAC SODIUM 1 % EX GEL
2.0000 g | Freq: Four times a day (QID) | CUTANEOUS | Status: DC
  Administered 2023-02-15: 2 g via TOPICAL
  Filled 2023-02-15: qty 100

## 2023-02-15 MED ORDER — FUROSEMIDE 10 MG/ML IJ SOLN
40.0000 mg | Freq: Once | INTRAMUSCULAR | Status: AC
  Administered 2023-02-15: 40 mg via INTRAVENOUS
  Filled 2023-02-15: qty 4

## 2023-02-15 NOTE — Progress Notes (Signed)
[facility] PROGRESS NOTE  Daryl Vega (DOB: [birth year]) [id number] PCP: Daryl Laurence, NP  Brief Narrative: Daryl Vega is a 8 male with history of COPD, chronic hypoxic failure on 2 L of oxygen, lung cancer status post chemo/radiation, diabetes type 2, PE who presented to emergency department at [facility] with complaints of shortness of breath, diarrhea, fever, chills.  On presentation he was febrile, tachycardic, tachypneic.  Labs showed leukocytosis.  Patient was requiring high amount of oxygen than his baseline.  Transferred to [facility]  hospital because there was no available stepdown bed at [facility]. Blood culture showed haemophilus influenza.  Respiratory status improving, though remains dependent on 6L O2. Steroids initiated for wheezing but stopped with severe hyperglycemia.   On 2/29, hypoxia worsened. CTA chest shows worsening consolidation and pleural effusion. PCCM consulted, 600cc bloody fluid pulled at time of thoracentesis.    Subjective: Says he's going home today as soon as his son comes to pick him up. Says his breathing is fine that all he'll be doing at home is sitting around and eating. Misses his dogs. When we turn his O2 down to his home 2L, SpO2 drops to 77% while getting to [facility] with good waveform.  Objective: BP 126/70 (BP Location: Left Arm)   Pulse (!) 105   Temp 97.8 F (36.6 C) (Axillary)   Resp (!) 25   Ht '5\' 8"'$  (1.727 m)   Wt 72.1 kg   SpO2 93%   BMI 24.18 kg/m   Gen: Chronically ill-appearing thin male  Pulm: More crackles on left, diminished on right, no wheezes.  CV: RRR, mild tachycardia, trace dependent edema.  GI: Soft, NT, ND, +BS Neuro: Alert and oriented. No new focal deficits. Psych: Irritable but cogent Ext: Warm, no deformities. Skin: No new rashes, lesions or ulcers on visualized skin   Assessment & Plan: Severe sepsis secondary to right-sided pneumonia, H. influenzae bacteremia: Presented with  fever, dyspnea, tachypnea, leukocytosis, lactic acidosis.  Chest imaging showed right-sided consolidation suspicious for pneumonia.  Blood cultures showed haemophilus influenza.  Currently afebrile, blood pressure stable.   - Continue antibiotics, plan for 10 days course total. H. flu is beta lactamase negative, so narrowed to high dose amox. - Monitor pleural fluid gram stain (neg), culture, cytology, sputum culture, blood cultures.   Acute on chronic hypoxic respiratory failure/COPD exacerbation: Secondary to pneumonia, pleural effusion.  - Note last bronch 11/25/2022 by Dr. Elsworth Vega showed nontraversible narrowing in proximal right mainstem bronchus, thick mucus at carina was suctioned off. BAL/brushings showed no definite malignancy, though atypical cells were noted.  - suspect multiple causes for current decompensation, likely including pneumonia with parapneumonic effusion, hx lung CA/XRT, possible recurrence (work up pending), possible mucus plug on right, possible pulmonary edema affecting left. Give dose of lasix. Discussing with PCCM who will evaluate again today.  - Continue Candiss Norse, Pulmicort, prn duonebs.  - Noting nasal congestion, viral swabs negative.  - Defer need for bronch or repeat thoracentesis to PCCM, will continue monitoring thoracentesis culture/cytology. - I don't believe the patient can be safely discharged.   Norovirus gastroenteritis: Improved symptoms.   History of lung cancer: Status post chemo/radiation.  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 Also showed nonspecific mediastinal/supraclavicular lymphadenopathy. Needs to rule out recurrence/metastasis.   - PET/CT recommended in outpatient previously to evaluate for recurrence/metastatic disease. Thoracentesis performed 2/29 with cytology pending. Needs to follow-up with his oncologist after discharge, follows at [facility]. ?if bronchoscopy is indicated   Normocytic  anemia: Currently hemoglobin  stable.  - Iron studies showed low iron, started on oral supplementation.   - Vitamin B-12 found to be low, given IM vitamin B-12 and started oral supplementation.  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 Labs confirm no DKA.  - Hypoglycemic after steroid discontinuation, deescalated glargine and SSI.   History of hyperlipidemia:  - Continue statin   Weakness : Sometimes uses walker, cane for ambulation. PT /OT consulted > plan to return home with home health once medically stabilized.   Moderate protein calorie malnutrition: Supplement protein as able.  Patrecia Pour, MD [facility] [email or web address] 02/15/2023, 7:51 AM

## 2023-02-15 NOTE — TOC Progression Note (Addendum)
Transition of Care [facility]) - Progression Note    Patient Details  Name: Daryl Vega MRN: [id number] Date of Birth: 1946/06/18  [facility] CM/SW Contact  Henrietta Dine, RN Phone Number: 02/15/2023, 2:24 PM  Clinical Narrative:    [facility] consulted for HHPT; pt also has home oxygen; spoke w/ pt's son [name] in room; pt asleep; he agrees to [place] on behalf of pt; he does not have agency preference; he is not sure what agency provides home oxygen for pt; LVM for Amy at [place]; spoke w/ Corene Cornea at [place] and he says unable to accept in [place], [place]; LVM for Marjory Lies at [place]; spoke w/ Alwyn Ren at [place]; she will check w/ admin on call and call this CM back; awaiting return call.  -[phone]- per Alwyn Ren at [place], declined by Lattie Haw d/t staffing  -[place] at [place], no  Expected Discharge Plan: [place] Barriers to Discharge: Continued Medical Work up  Expected Discharge Plan and Services   Discharge Planning Services: CM Consult                               HH Arranged: PT           Social Determinants of Health (SDOH) Interventions SDOH Screenings   Food Insecurity: No Food Insecurity (02/08/2023)  Housing: Low Risk  (02/08/2023)  Transportation Needs: No Transportation Needs (02/08/2023)  Utilities: Not At Risk (02/08/2023)  Tobacco Use: Medium Risk (02/12/2023)    Readmission Risk Interventions     No data to display

## 2023-02-15 NOTE — Progress Notes (Signed)
NAME:  Daryl Vega, MRN:  [id number], DOB:  1946/09/17, LOS: 7 ADMISSION DATE:  02/08/2023, CONSULTATION DATE:  02/12/2023 REFERRING MD:  Dr. Bonner Puna, [facility], CHIEF COMPLAINT:  Short of breath   History of Present Illness:  77 yo male former smoker followed by Dr. Elsworth Soho for COPD and chronic respiratory failure on 2 liters oxygen, Stage 3b NSCLC in 2015 s/p XRT to Rt upper lobe presented to [facility] ER on 2/25 with temp 100.57F, nausea/vomiting and diarrhea.  Found to have Rt sided pneumonia and positive for norovirus.  Blood culture positive for Haemophilus influenzae.  CT chest showed progressive Rt pleural effusion and occlusion of Rt main bronchus.  PCCM consulted to assist with respiratory management, mucus plugging and assess pleural effusion.  Pertinent  Medical History  Anemia, C diff, Cholecystoduodenal fistula, COPD, DM, Duodenal ulcer, Gastroparesis, HLD, Melanoma, Lung cancer, PE 2018  Significant Hospital Events: Including procedures, antibiotic start and stop dates in addition to other pertinent events   2/25 Admit, start ABx 2/29 Right Thora with 600 ml rusty colored fluid removed   Studies:  CT chest 2/25 >> RUL XRT fibrosis, consolidation in Rt infrahilar and RLL, area of consolidation in lingula, band like scarring Rt lung base, moderate emphysema CT angio chest 2/29 >> nearly occluded Rt main bronchus, large Rt pleural effusion, small Lt effusion  Interim History / Subjective:  Tmax 99.1 O2 down to 7L PT reports nasal stuffiness   Objective   Blood pressure 126/70, pulse (!) 105, temperature (!) 100.7 F (38.2 C), temperature source Oral, resp. rate (!) 25, height '5\' 8"'$  (1.727 m), weight 72.1 kg, SpO2 90 %.        Intake/Output Summary (Last 24 hours) at 02/15/2023 0948 Last data filed at 02/15/2023 [id number] Gross per 24 hour  Intake 300 ml  Output 1570 ml  Net -1270 ml    Filed Weights   02/08/23 [id number]  Weight: 72.1 kg    Examination: General: cachectic adult  male lying in bed in NAD HEENT: MM pink/moist, anicteric, [place] O2 Neuro: Awake/alert, talks in full paragraphs telling stories, MAE CV: s1s2 RRR, no m/r/g PULM: non-labored at rest, prolonged exp phase, lungs bilaterally diminished R>L, without wheeze  GI: soft, bsx4 active  Extremities: warm/dry, no edema  Skin: no rashes or lesions  PCXR > R airspace disease, limited effusion  Resolved Hospital Problem list     Assessment & Plan:   Pneumonia of right lung with H influenza bacteremia and parapneumonic right pleural effusion -- Follow-up cytology, cultures from thoracentesis NGTD -- Continue antibiotics, recommend 14 days total -- Chest x-ray appears some mucus impaction, atelectasis, he must mobilizes on his own  Acute on chronic hypoxemic respiratory failure: Uses oxygen at home.  High requirements here.  May be new baseline given significant pneumonia burden, further worsening of underlying obstructive lung disease with him salt. -- Recommend ensuring he has adequate oxygen supplementation at home with home concentrator can deliver adequate flows as well as alternative to portable oxygen concentrator, likely tanks for continuous oxygen administration as opposed to pulsed dose oxygen via POC to adequately treat hypoxemia prior to discharge  COPD with emphysema. -continue brovana, pulmicort, yupelri -PRN albuterol   PCCM will sign off  Best Practice (right click and "Reselect all SmartList Selections" daily)  Per Primary   Signature:   Lanier Clam, MD [facility] 02/15/2023, 9:48 AM  Please see [email or web address] for pager details.  From 7A-7P if no response, please call  [phone] After hours, please call ELink [phone]

## 2023-02-15 NOTE — Plan of Care (Signed)
  Problem: Education: Goal: Ability to describe self-care measures that may prevent or decrease complications (Diabetes Survival Skills Education) will improve Outcome: Progressing Goal: Individualized Educational Video(s) Outcome: Progressing   Problem: Coping: Goal: Ability to adjust to condition or change in health will improve Outcome: Progressing   Problem: Fluid Volume: Goal: Ability to maintain a balanced intake and output will improve Outcome: Progressing   Problem: Health Behavior/Discharge Planning: Goal: Ability to identify and utilize available resources and services will improve Outcome: Progressing Goal: Ability to manage health-related needs will improve Outcome: Progressing   Problem: Metabolic: Goal: Ability to maintain appropriate glucose levels will improve Outcome: Progressing   Problem: Nutritional: Goal: Maintenance of adequate nutrition will improve Outcome: Progressing Goal: Progress toward achieving an optimal weight will improve Outcome: Progressing   Problem: Skin Integrity: Goal: Risk for impaired skin integrity will decrease Outcome: Progressing   Problem: Tissue Perfusion: Goal: Adequacy of tissue perfusion will improve Outcome: Progressing   Problem: Education: Goal: Knowledge of General Education information will improve Description: Including pain rating scale, medication(s)/side effects and non-pharmacologic comfort measures Outcome: Progressing   Problem: Health Behavior/Discharge Planning: Goal: Ability to manage health-related needs will improve Outcome: Progressing   Problem: Clinical Measurements: Goal: Ability to maintain clinical measurements within normal limits will improve Outcome: Progressing Goal: Will remain free from infection Outcome: Progressing Goal: Diagnostic test results will improve Outcome: Progressing Goal: Respiratory complications will improve Outcome: Progressing Goal: Cardiovascular complication will  be avoided Outcome: Progressing   Problem: Activity: Goal: Risk for activity intolerance will decrease Outcome: Progressing   Problem: Nutrition: Goal: Adequate nutrition will be maintained Outcome: Progressing   Problem: Coping: Goal: Level of anxiety will decrease Outcome: Progressing   Problem: Elimination: Goal: Will not experience complications related to bowel motility Outcome: Progressing Goal: Will not experience complications related to urinary retention Outcome: Progressing   Problem: Pain Managment: Goal: General experience of comfort will improve Outcome: Progressing   Problem: Safety: Goal: Ability to remain free from injury will improve Outcome: Progressing   Problem: Skin Integrity: Goal: Risk for impaired skin integrity will decrease Outcome: Progressing   Problem: Education: Goal: Knowledge of disease or condition will improve Outcome: Progressing Goal: Knowledge of the prescribed therapeutic regimen will improve Outcome: Progressing Goal: Individualized Educational Video(s) Outcome: Progressing   Problem: Activity: Goal: Ability to tolerate increased activity will improve Outcome: Progressing Goal: Will verbalize the importance of balancing activity with adequate rest periods Outcome: Progressing   Problem: Respiratory: Goal: Ability to maintain a clear airway will improve Outcome: Progressing Goal: Levels of oxygenation will improve Outcome: Progressing Goal: Ability to maintain adequate ventilation will improve Outcome: Progressing

## 2023-02-16 LAB — BODY FLUID CULTURE W GRAM STAIN: Culture: NO GROWTH

## 2023-02-16 LAB — GLUCOSE, CAPILLARY: Glucose-Capillary: 232 mg/dL — ABNORMAL HIGH (ref 70–99)

## 2023-02-16 MED ORDER — FERROUS SULFATE 324 (65 FE) MG PO TBEC: 1.0000 | DELAYED_RELEASE_TABLET | Freq: Every day | ORAL | 0 refills | Status: DC

## 2023-02-16 MED ORDER — AMOXICILLIN 500 MG PO CAPS: 1000.0000 mg | ORAL_CAPSULE | Freq: Three times a day (TID) | ORAL | 0 refills | Status: AC

## 2023-02-16 MED ORDER — VITAMIN B-12 1000 MCG PO TABS: 1000.0000 ug | ORAL_TABLET | Freq: Every day | ORAL | 0 refills | Status: DC

## 2023-02-16 NOTE — Progress Notes (Signed)
At 0800 MD Bonner Puna exited patient room to notify this RN that patient wishes to leave AMA. This Designer, multimedia entered patient room to assess. Patient is A&Ox4. Patient continues to ask "where is my paper to sign so that I can leave?" Patient told staff he would call his son to come get him. RN attempted to educate patient and requested that he wait until case management can assure that he has all necessary home health items. Patient declined. Patient states he has everything he needs at home. RN educated patient on contents of AMA form and patient acknowledged understanding and signed.

## 2023-02-16 NOTE — Discharge Summary (Signed)
Physician Discharge Summary   Patient: Daryl Vega MRN: [id number] DOB: 1946-12-06  Admit date:     02/08/2023  Discharge date: Left against strong medical advice 02/16/2023  Discharge Physician: Patrecia Pour   PCP: Nelly Laurence, NP   Recommendations at discharge:  Follow up with PCP ASAP. Pt left AMA during admission for acute on chronic hypoxic respiratory failure. Prescription sent to pharmacy for amoxicillin high dose to complete 14 days Tx based on blood cultures that grew H. influenza.  Suggest palliative care involvement (pt left before being assessed here by consult service).  Follow up with pulmonary, seen by Dr. Elsworth Soho previously Follow up with oncology due to concern for recurrence of lung cancer. Follow up results of cytology from thoracentesis performed 02/12/2023.  Discharge Diagnoses: Active Problems:   Anemia of chronic disease   Dyslipidemia associated with type 2 diabetes mellitus ([place])   Community acquired pneumonia of right lower lobe of lung   COPD mixed type ([place])   Lung cancer ([place])   Pulmonary embolism ([place])   Sepsis ([place])   Malnutrition of moderate degree  Hospital Course: Daryl Vega is a 77 male with history of COPD, chronic hypoxic failure on 2 L of oxygen, lung cancer status post chemo/radiation, diabetes type 2, PE who presented to emergency department at [facility] with complaints of shortness of breath, diarrhea, fever, chills.  On presentation he was febrile, tachycardic, tachypneic.  Labs showed leukocytosis.  Patient was requiring high amount of oxygen than his baseline.  Transferred to [facility]  hospital because there was no available stepdown bed at [facility]. Blood culture showed haemophilus influenza.  Respiratory status improving, though remains dependent on 6L O2. Steroids initiated for wheezing but stopped with severe hyperglycemia.    On 2/29, hypoxia worsened. CTA chest shows worsening consolidation and pleural effusion.  PCCM consulted, 600cc bloody fluid pulled at time of thoracentesis, labs still pending. The patient's hypoxia persisted, though the demanded to leave the hospital. PCCM was reconsulted to confirm follow up and CM was consulted to assist with providing concentrator and back up tanks to provide up to 10LPM.  Assessment and Plan: Severe sepsis secondary to right-sided pneumonia, H. influenzae bacteremia: Presented with fever, dyspnea, tachypnea, leukocytosis, lactic acidosis.  Chest imaging showed right-sided consolidation suspicious for pneumonia.  Blood cultures showed haemophilus influenza.  Currently afebrile, blood pressure stable.   - Continue antibiotics, plan for 14 days. H. flu is beta lactamase negative, so narrowed to high dose amox. - Monitor pleural fluid gram stain (neg), culture, cytology, sputum culture, blood cultures. Pt left prior to finalized results.    Acute on chronic hypoxic respiratory failure/COPD exacerbation: Secondary to pneumonia, pleural effusion.  - Note last bronch 11/25/2022 by Dr. Elsworth Soho showed nontraversible narrowing in proximal right mainstem bronchus, thick mucus at [place]ina was suctioned off. BAL/brushings showed no definite malignancy, though atypical cells were noted.  - Suspect multiple causes for current decompensation, likely including pneumonia with parapneumonic effusion, hx lung CA/XRT, possible recurrence (work up pending), possible mucus plug on right, possible pulmonary edema affecting left treated with lasix x1. - PCCM evaluated during admission and can follow up with patient if patient amenable.  - Continue home inhaled breathing treatments.  - Noting nasal congestion, viral swabs negative.  - Defer need for bronch or repeat thoracentesis to PCCM  - I don't believe the patient can be safely discharged. He does, however, possess the capacity to make informed medical decisions at this time.  Norovirus gastroenteritis: Improved symptoms.   History of  lung cancer: Status post chemo/radiation.  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 Also showed nonspecific mediastinal/supraclavicular lymphadenopathy. Needs to rule out recurrence/metastasis.   - PET/CT recommended in outpatient previously to evaluate for recurrence/metastatic disease. Thoracentesis performed 2/29 with cytology pending. Needs to follow-up with his oncologist after discharge, follows at [facility].     Normocytic anemia: Currently hemoglobin stable.  - Iron studies showed low iron, started on oral supplementation.   - Vitamin B-12 found to be low, given IM vitamin B-12 and started oral supplementation. - Both supplements were sent to pharmacy at time patient opted to leave AMA.    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 Labs confirm no DKA.  - Continue home Tx   History of hyperlipidemia:  - Continue statin   Weakness : Sometimes uses walker, cane for ambulation. PT /OT consulted > plan to return home with home health once medically stabilized. Pt does not wish to have this arranged.   Moderate protein calorie malnutrition: Supplement protein as able  Consultants: PCCM Procedures performed: Thoracentesis 2/29  Disposition: Home Diet recommendation: As tolerated, carb modified DISCHARGE MEDICATION: Allergies as of 02/16/2023   No Known Allergies      Medication List     STOP taking these medications    nystatin 100000 UNIT/ML suspension Commonly known as: MYCOSTATIN       TAKE these medications    acetaminophen 325 MG tablet Commonly known as: TYLENOL Take 2 tablets (650 mg total) by mouth every 6 (six) hours as needed for mild pain (or Fever >/= 101).   albuterol 108 (90 Base) MCG/ACT inhaler Commonly known as: VENTOLIN HFA Inhale 2 puffs into the lungs every 4 (four) hours as needed. What changed: reasons to take this   albuterol  (2.5 MG/3ML) 0.083% nebulizer solution Commonly known as: PROVENTIL Take 3 mLs (2.5 mg total) by nebulization every 4 (four) hours as needed for wheezing or shortness of breath. What changed: Another medication with the same name was changed. Make sure you understand how and when to take each.   amoxicillin 500 MG capsule Commonly known as: AMOXIL Take 2 capsules (1,000 mg total) by mouth every 8 (eight) hours for 7 days.   gabapentin 300 MG capsule Commonly known as: NEURONTIN Take 900 mg by mouth 3 (three) times daily.   glipiZIDE 10 MG 24 hr tablet Commonly known as: GLUCOTROL XL Take 1 tablet (10 mg total) by mouth daily with breakfast.   metFORMIN 1000 MG tablet Commonly known as: GLUCOPHAGE Take 1 tablet (1,000 mg total) by mouth 2 (two) times daily with a meal.   mirtazapine 7.5 MG tablet Commonly known as: REMERON Take 7.5 mg by mouth at bedtime.   rosuvastatin 20 MG tablet Commonly known as: CRESTOR Take 20 mg by mouth at bedtime.   traMADol 50 MG tablet Commonly known as: ULTRAM Take 50 mg by mouth every 6 (six) hours as needed for moderate pain or severe pain.   Trelegy Ellipta 100-62.5-25 MCG/ACT Aepb Generic drug: Fluticasone-Umeclidin-Vilant Inhale 1 puff into the lungs daily.        Discharge Exam: Filed Weights   02/08/23 [id number]  Weight: 72.1 kg  Chronically ill-appearing irritable male in no distress RRR, no MRG, did not allow for posterior pulmonary exam.  No pitting edema Alert, oriented, able to comprehend the recommended treatment plan and its rationale as well as the  reasonable possibility of death or decompensation if he leaves the hospital today.  Condition at discharge: guarded  The results of significant diagnostics from this hospitalization (including imaging, microbiology, ancillary and laboratory) are listed below for reference.   Imaging Studies: DG CHEST PORT 1 VIEW  Result Date: 02/14/2023 CLINICAL DATA:  Pleural effusion EXAM:  PORTABLE CHEST 1 VIEW COMPARISON:  02/13/2023 FINDINGS: No significant change in large right pleural effusion with nearly complete atelectasis or consolidation of the right lung. Cardiac borders largely obscured. Diffuse interstitial opacity of the aerated left lung. Osseous structures unremarkable. IMPRESSION: 1. No significant change in large right pleural effusion with nearly complete atelectasis or consolidation of the right lung. 2. Diffuse interstitial opacity of the aerated left lung, consistent with edema. Electronically Signed   By: Delanna Ahmadi M.D.   On: 02/14/2023 10:49   DG CHEST PORT 1 VIEW  Result Date: 02/13/2023 CLINICAL DATA:  Mucous plug and pleural effusion. EXAM: PORTABLE CHEST 1 VIEW COMPARISON:  Chest radiograph February 12, 2023. FINDINGS: The heart size and mediastinal contours are obscured. Increased consolidation in the right lung. Abrupt loss of lucency along the right mainstem bronchus may reflect patient's reported mucous plug. Large right pleural effusion. Similar cicatricial changes in the right apex corresponding to the area of masslike consolidation on prior CT and in keeping with known treated disease. IMPRESSION: 1. Increased consolidation in the right lung. Abrupt loss of lucency along the right mainstem bronchus may reflect patient's reported mucous plug. 2. Large right pleural effusion. Electronically Signed   By: Dahlia Bailiff M.D.   On: 02/13/2023 08:03   DG Chest Port 1 View  Result Date: 02/12/2023 CLINICAL DATA:  Status post thoracentesis EXAM: PORTABLE CHEST 1 VIEW COMPARISON:  02/10/2023 FINDINGS: Cicatricial changes are seen at the right apex corresponding to the area of masslike consolidation within the paramediastinal right apex on CT examination of 1:22 p.m. right pleural effusion has slightly decreased in the interval since prior examination though large pleural fluid remains. There is right basilar collapse again noted. Minimal left basilar atelectasis or  infiltrate is present. No pneumothorax. No pleural effusion on the left. Cardiac silhouette is largely obscured, but appears stable since prior exam. Pulmonary vascularity is normal. No acute bone abnormality. IMPRESSION: 1. Slight interval decrease in size of large right pleural effusion status post thoracentesis. Large pleural fluid remains. No pneumothorax. Associated right basilar collapse. 2. Cicatricial changes at the right apex corresponding to the area of masslike consolidation on CT examination in keeping with treated disease. Electronically Signed   By: Fidela Salisbury M.D.   On: 02/12/2023 19:26   CT Angio Chest Pulmonary Embolism (PE) W or WO Contrast  Result Date: 02/12/2023 CLINICAL DATA:  PE suspected, lung cancer * Tracking Code: BO * EXAM: CT ANGIOGRAPHY CHEST WITH CONTRAST TECHNIQUE: Multidetector CT imaging of the chest was performed using the standard protocol during bolus administration of intravenous contrast. Multiplanar CT image reconstructions and MIPs were obtained to evaluate the vascular anatomy. RADIATION DOSE REDUCTION: This exam was performed according to the departmental dose-optimization program which includes automated exposure control, adjustment of the mA and/or kV according to patient size and/or use of iterative reconstruction technique. CONTRAST:  59m OMNIPAQUE IOHEXOL 350 MG/ML SOLN COMPARISON:  CT chest abdomen pelvis, 02/08/2023 FINDINGS: Cardiovascular: Satisfactory opacification of the pulmonary arteries to the segmental level. No evidence of pulmonary embolism. Normal heart size. Three-vessel coronary artery calcifications. No pericardial effusion. Aortic atherosclerosis. Mediastinum/Nodes: Similar appearance of matted soft tissue about  the right hilum (series 4, image 34). Thyroid gland, trachea, and esophagus demonstrate no significant findings. Lungs/Pleura: Nearly occluded right mainstem bronchus (series 4, image 34). Large right pleural effusion and associated  atelectasis or consolidation, increased compared to prior examination. Small left pleural effusion and associated atelectasis or consolidation, increased compared to prior examination. Similar appearance of treated mass about the suprahilar right upper lobe (series 4, image 28). Upper Abdomen: No acute abnormality. Unchanged right adrenal nodule, previously characterized as a benign adenoma (series 4, image 86). Musculoskeletal: No chest wall abnormality. No acute osseous findings. Review of the MIP images confirms the above findings. IMPRESSION: 1. Negative examination for pulmonary embolism. 2. Large right pleural effusion and associated atelectasis or consolidation, increased compared to prior examination. 3. Small left pleural effusion and associated atelectasis or consolidation, increased compared to prior examination. 4. Similar appearance of treated mass about the suprahilar right upper lobe. 5. Similar appearance of matted soft tissue about the right hilum. 6. Nearly occluded right mainstem bronchus. 7. Coronary artery disease. Aortic Atherosclerosis (ICD10-I70.0). Electronically Signed   By: Delanna Ahmadi M.D.   On: 02/12/2023 13:56   DG CHEST PORT 1 VIEW  Result Date: 02/10/2023 CLINICAL DATA:  Shortness of breath EXAM: PORTABLE CHEST 1 VIEW COMPARISON:  Chest radiograph 2 days prior FINDINGS: Since the study from 2 days prior, there has been significantly worsened aeration of the right lung which is now nearly completely opacified. The left lung is clear. There is no significant left effusion. There is no pneumothorax The cardiomediastinal silhouette is not well assessed. There is no appreciable mediastinal shift in either direction, allowing for slight patient rotation. There is no acute osseous abnormality. IMPRESSION: Significantly worsened aeration of the right lung since 02/08/2023 with now near-complete opacification of the right hemithorax which may reflect an enlarging pleural effusion, lobar  collapse with volume loss, or combination thereof. Electronically Signed   By: Valetta Mole M.D.   On: 02/10/2023 12:55   CT CHEST ABDOMEN PELVIS W CONTRAST  Result Date: 02/08/2023 CLINICAL DATA:  Sepsis. Shortness of breath, diarrhea and fever. History of CHF and COPD. History of lung cancer in remission. EXAM: CT CHEST, ABDOMEN, AND PELVIS WITH CONTRAST TECHNIQUE: Multidetector CT imaging of the chest, abdomen and pelvis was performed following the standard protocol during bolus administration of intravenous contrast. RADIATION DOSE REDUCTION: This exam was performed according to the departmental dose-optimization program which includes automated exposure control, adjustment of the mA and/or kV according to patient size and/or use of iterative reconstruction technique. CONTRAST:  173m OMNIPAQUE IOHEXOL 300 MG/ML  SOLN COMPARISON:  Chest CT dated 02/02/2023. CT abdomen and pelvis dated 07/31/2015. FINDINGS: CT CHEST FINDINGS Cardiovascular: No thoracic aortic aneurysm or evidence of aortic dissection. No clinically significant pericardial effusion. No central obstructing pulmonary embolism is seen. Aortic and coronary atherosclerosis. Mediastinum/Nodes: The mildly prominent lymph nodes described in detail on chest CT report of 02/02/2023 are not significantly changed in the short-term interval. Esophagus is unremarkable. Trachea is unremarkable. Lungs/Pleura: RIGHT upper lobe consolidation, compatible with chronic radiation fibrosis, is stable. There are increasing consolidations within the RIGHT infrahilar and posterior RIGHT lower lung. Stable small consolidation within the lingula. Stable chronic consolidation/banding at the most inferior aspect of the RIGHT lung base. Stable centrilobular and paraseptal emphysematous changes bilaterally, upper lobe predominant. Musculoskeletal: No acute or suspicious osseous abnormality. CT ABDOMEN PELVIS FINDINGS Hepatobiliary: No focal liver abnormality is seen. Status  post cholecystectomy. No bile duct dilatation is seen. Pancreas: Unremarkable. No pancreatic  ductal dilatation or surrounding inflammatory changes. Spleen: Normal in size without focal abnormality. Adrenals/Urinary Tract: Benign RIGHT adrenal adenoma. No follow-up imaging is needed for this benign finding. LEFT adrenal glands unremarkable. Kidneys are unremarkable without suspicious mass, stone or hydronephrosis. No perinephric inflammation. No ureteral or bladder calculi are identified. Stomach/Bowel: No dilated large or small bowel loops. Fluid is seen throughout majority of the nondistended small bowel, with least mildly prominent enhancement of the walls of the small bowel throughout the lower abdomen and pelvis. There is a RIGHT inguinal hernia which contains a portion of the small bowel, but there is no evidence of obstruction or inflammation at this hernia site. Stomach is unremarkable.  Appendix appears normal. Vascular/Lymphatic: Aortic atherosclerosis. No abdominal aortic aneurysm. No acute-appearing vascular abnormality. No enlarged lymph nodes are seen in the abdomen or pelvis. Reproductive: Prostate is unremarkable. Other: No free fluid or abscess collection. No free intraperitoneal air. Musculoskeletal: Degenerative spondylosis of the lumbar spine, moderate in degree. No acute-appearing osseous abnormality. IMPRESSION: 1. Increasing consolidations within the RIGHT infrahilar and posterior RIGHT lower lung, suspicious for pneumonia or aspiration. 2. Fluid is seen throughout majority of the nondistended small bowel, with least mildly prominent enhancement of the walls of the small bowel throughout the lower abdomen and pelvis. Findings are suspicious for a mild enteritis of infectious or inflammatory nature. 3. RIGHT inguinal hernia which contains a portion of the small bowel, but there is no evidence of bowel obstruction or inflammation at this hernia site. 4. Stable appearance of the radiation fibrosis  in the RIGHT upper lung. 5. Nonspecific mild mediastinal and supraclavicular lymphadenopathy, stable compared to the recent chest CT of 02/02/2023, with this recent CT report recommending either PET-CT for further characterization or short-term follow-up chest CT in 3 months. Emphysema (ICD10-J43.9). Electronically Signed   By: Franki Cabot M.D.   On: 02/08/2023 08:31   DG Chest Portable 1 View  Result Date: 02/08/2023 CLINICAL DATA:  77 year old male with clinical findings concerning for pneumonia. EXAM: PORTABLE CHEST 1 VIEW COMPARISON:  Chest x-ray 11/26/2022.  Chest CT 02/02/2023. FINDINGS: Persistent mass-like architectural distortion throughout the upper right hemithorax, similar to the prior study, most compatible with an area of chronic postradiation mass-like fibrosis. New mass-like opacities in the base of the right lung concerning for potential airspace consolidation. Widespread interstitial prominence and peribronchial cuffing again noted. No consolidative airspace disease in the left lung. No left pleural effusion. Chronic moderate partially loculated right pleural effusion is unchanged. Heart size is normal. Mediastinal contours remain grossly distorted by postradiation changes. IMPRESSION: 1. Probable new area of airspace consolidation in the base of the right lung concerning for pneumonia. 2. Chronic postradiation changes in the right hemithorax related to treated lung cancer redemonstrated, as above. Electronically Signed   By: Vinnie Langton M.D.   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 Concern for recurrence on prior imaging with evidence of atypical cells on bronchoscopic biopsy 11/24/2022. Restaging. * Tracking Code: BO * EXAM: CT CHEST WITH CONTRAST TECHNIQUE: Multidetector CT imaging of the chest was performed during  intravenous contrast administration. RADIATION DOSE REDUCTION: This exam was performed according to the departmental dose-optimization program which includes automated exposure control, adjustment of the mA and/or kV according to patient size and/or use of iterative reconstruction technique. CONTRAST:  52m OMNIPAQUE IOHEXOL 300 MG/ML  SOLN COMPARISON:  11/24/2022 chest CT angiogram. FINDINGS: Cardiovascular: Normal heart size. No significant pericardial effusion/thickening. Three-vessel coronary atherosclerosis. Atherosclerotic nonaneurysmal thoracic aorta. Normal caliber pulmonary arteries. No central pulmonary emboli. Mediastinum/Nodes: No significant thyroid nodules. Mildly patulous upper thoracic esophagus, unchanged. No axillary adenopathy. Newly mildly enlarged 1.3 cm right supraclavicular node (series 2/image 24). New mild left prevascular adenopathy up to 1.0 cm (series 2/image 65). Stable right subcarinal enlarged 1.1 cm node (series 2/image 59). Similar nonspecific soft tissue density layering along the low right tracheal wall measuring 0.4 cm thickness (series 2/image 48). Stable mildly enlarged 1.0 cm AP window node (series 2/image 58). Stable mildly enlarged 1.0 cm left hilar node (series 2/image 74). Lungs/Pleura: No pneumothorax. Moderate centrilobular emphysema. Loculated small right basilar pleural effusion is mildly increased. No left pleural effusion. Sharply marginated upper right perihilar and upper right lung consolidation with prominent volume loss, not appreciably changed, compatible with radiation fibrosis. Waxing and waning regions of patchy consolidation and parenchymal banding at the right greater than left lung bases, mildly worsened at the right lung base particularly medially on series 4/image 91) and mildly improved in the dependent basilar left lower lobe. Similar waxing and waning mild patchy consolidation and ground-glass opacity in the lingula, with new focus of patchy  consolidation on series 4/image 83 and slightly improved patchy consolidation and ground-glass opacity in the inferior lingula. No new discrete pulmonary nodules. Upper abdomen: Right adrenal 2.7 cm nodule with density 17 HU, unchanged and previously characterized as a benign adenoma, for which no follow-up imaging is recommended. Nonobstructive 2 mm upper right renal stone. Several simple bilateral upper renal cysts, largest 2.1 cm in the posterior upper left kidney. Musculoskeletal: No aggressive appearing focal osseous lesions. Moderate thoracic spondylosis. IMPRESSION: 1. Nonspecific new mild right supraclavicular and left prevascular adenopathy, cannot exclude recurrent metastatic disease, although reactive adenopathy is possible given the waxing and waning regions of patchy bilateral lung consolidation suggestive of recurrent aspiration. Consider either PET-CT for further characterization at this time or short-term follow-up chest CT with IV contrast in 3 months. 2. Stable radiation fibrosis in the upper right perihilar and upper right lung. 3. Stable mild nonspecific soft tissue density along the low right tracheal wall. 4. Loculated small right basilar pleural effusion is mildly increased. 5. Three-vessel coronary atherosclerosis. 6. Nonobstructive right nephrolithiasis. 7. Stable right adrenal adenoma, for which no follow-up imaging is recommended. 8. Aortic Atherosclerosis (ICD10-I70.0) and Emphysema (ICD10-J43.9). Electronically Signed   By: Ilona Sorrel M.D.   On: 02/02/2023 16:42    Microbiology: Results for orders placed or performed during the hospital encounter of 02/08/23  MRSA Next Gen by PCR, Nasal     Status: None   Collection Time: 02/08/23  5:45 AM   Specimen: Nasal Mucosa; Nasal Swab  Result Value Ref Range Status   MRSA by PCR Next Gen NOT DETECTED NOT DETECTED Final    Comment: (NOTE) The GeneXpert MRSA Assay (FDA approved for NASAL specimens only), is one component of a  comprehensive MRSA colonization surveillance program. It is not intended to diagnose MRSA infection nor to guide or monitor treatment for MRSA infections. Test performance is not FDA approved in patients less than 101 years old. Performed at [facility], [place] [address], [place], [place] [address]   Resp panel by RT-PCR (RSV, Flu A&B, Covid) Anterior Nasal Swab     Status: None   Collection Time: 02/08/23  6:45 AM   Specimen: Anterior Nasal Swab  Result Value Ref Range Status  SARS Coronavirus 2 by RT PCR NEGATIVE NEGATIVE Final    Comment: (NOTE) SARS-CoV-2 target nucleic acids are NOT DETECTED.  The SARS-CoV-2 RNA is generally detectable in upper respiratory specimens during the acute phase of infection. The lowest concentration of SARS-CoV-2 viral copies this assay can detect is 138 copies/mL. A negative result does not preclude SARS-Cov-2 infection and should not be used as the sole basis for treatment or other patient management decisions. A negative result may occur with  improper specimen collection/handling, submission of specimen other than nasopharyngeal swab, presence of viral mutation(s) within the areas targeted by this assay, and inadequate number of viral copies(<138 copies/mL). A negative result must be combined with clinical observations, patient history, and epidemiological information. The expected result is Negative.  Fact Sheet for Patients:  EntrepreneurPulse.com.au  Fact Sheet for Healthcare Providers:  IncredibleEmployment.be  This test is no t yet approved or cleared by the [place] FDA and  has been authorized for detection and/or diagnosis of SARS-CoV-2 by FDA under an Emergency Use Authorization (EUA). This EUA will remain  in effect (meaning this test can be used) for the duration of the COVID-19 declaration under Section 564(b)(1) of the Act, 21 U.S.C.section 360bbb-3(b)(1), unless the  authorization is terminated  or revoked sooner.       Influenza A by PCR NEGATIVE NEGATIVE Final   Influenza B by PCR NEGATIVE NEGATIVE Final    Comment: (NOTE) The Xpert Xpress SARS-CoV-2/FLU/RSV plus assay is intended as an aid in the diagnosis of influenza from Nasopharyngeal swab specimens and should not be used as a sole basis for treatment. Nasal washings and aspirates are unacceptable for Xpert Xpress SARS-CoV-2/FLU/RSV testing.  Fact Sheet for Patients: EntrepreneurPulse.com.au  Fact Sheet for Healthcare Providers: IncredibleEmployment.be  This test is not yet approved or cleared by the [place] FDA and has been authorized for detection and/or diagnosis of SARS-CoV-2 by FDA under an Emergency Use Authorization (EUA). This EUA will remain in effect (meaning this test can be used) for the duration of the COVID-19 declaration under Section 564(b)(1) of the Act, 21 U.S.C. section 360bbb-3(b)(1), unless the authorization is terminated or revoked.     Resp Syncytial Virus by PCR NEGATIVE NEGATIVE Final    Comment: (NOTE) Fact Sheet for Patients: EntrepreneurPulse.com.au  Fact Sheet for Healthcare Providers: IncredibleEmployment.be  This test is not yet approved or cleared by the [place] FDA and has been authorized for detection and/or diagnosis of SARS-CoV-2 by FDA under an Emergency Use Authorization (EUA). This EUA will remain in effect (meaning this test can be used) for the duration of the COVID-19 declaration under Section 564(b)(1) of the Act, 21 U.S.C. section 360bbb-3(b)(1), unless the authorization is terminated or revoked.  Performed at [facility], [address], [place], [place] [address]   Blood culture (routine x 2)     Status: Abnormal   Collection Time: 02/08/23  6:45 AM   Specimen: BLOOD  Result Value Ref Range Status   Specimen Description   Final    BLOOD BLOOD  RIGHT ARM Performed at [facility], [address] [place], [place] [address]    Special Requests   Final    Blood Culture adequate volume Performed at [facility], [address], [place], [place] [address]    Culture  Setup Time   Final    GRAM NEGATIVE RODS AEROBIC BOTTLE ONLY Gram Stain Report Called to,Read Back By and Verified With: ETHEN POLLET '@0831'$  02/09/2023 BY T KENNEDY CRITICAL VALUE NOTED.  VALUE IS CONSISTENT WITH PREVIOUSLY REPORTED AND CALLED VALUE. Performed at [facility], [place] [address] [place], [place] [address]    Culture HAEMOPHILUS INFLUENZAE (A)  Final   Report Status 02/10/2023 FINAL  Final  Blood culture (routine x 2)     Status: Abnormal   Collection Time: 02/08/23  6:50 AM   Specimen: BLOOD  Result Value Ref Range Status   Specimen Description BLOOD BLOOD LEFT HAND  Final   Special Requests Blood Culture adequate volume  Final   Culture  Setup Time   Final    GRAM NEGATIVE RODS ANAEROBIC BOTTLE ONLY Gram Stain Report Called to,Read Back By and Verified With: ETHEN POLLET AT 0831 02/09/2023 BY T KENNEDY Organism ID to follow CRITICAL RESULT CALLED TO, READ BACK BY AND VERIFIED WITH:  C/ PHARMD C. SHADE 02/09/23 1420 A. [place]    Culture HAEMOPHILUS INFLUENZAE BETA LACTAMASE NEGATIVE  (A)  Final   Report Status 02/10/2023 FINAL  Final  Blood Culture ID Panel (Reflexed)     Status: Abnormal   Collection Time: 02/08/23  6:50 AM  Result Value Ref Range Status   Enterococcus faecalis NOT DETECTED NOT DETECTED Final   Enterococcus Faecium NOT DETECTED NOT DETECTED Final   Listeria monocytogenes NOT DETECTED NOT DETECTED Final   Staphylococcus species NOT DETECTED NOT DETECTED Final   Staphylococcus aureus (BCID) NOT DETECTED NOT DETECTED Final   Staphylococcus epidermidis NOT DETECTED NOT DETECTED Final   Staphylococcus lugdunensis NOT DETECTED NOT DETECTED Final   Streptococcus species NOT DETECTED NOT DETECTED Final   Streptococcus  agalactiae NOT DETECTED NOT DETECTED Final   Streptococcus pneumoniae NOT DETECTED NOT DETECTED Final   Streptococcus pyogenes NOT DETECTED NOT DETECTED Final   A.calcoaceticus-baumannii NOT DETECTED NOT DETECTED Final   Bacteroides fragilis NOT DETECTED NOT DETECTED Final   Enterobacterales NOT DETECTED NOT DETECTED Final   Enterobacter cloacae complex NOT DETECTED NOT DETECTED Final   Escherichia coli NOT DETECTED NOT DETECTED Final   Klebsiella aerogenes NOT DETECTED NOT DETECTED Final   Klebsiella oxytoca NOT DETECTED NOT DETECTED Final   Klebsiella pneumoniae NOT DETECTED NOT DETECTED Final   Proteus species NOT DETECTED NOT DETECTED Final   Salmonella species NOT DETECTED NOT DETECTED Final   Serratia marcescens NOT DETECTED NOT DETECTED Final   Haemophilus influenzae DETECTED (A) NOT DETECTED Final    Comment: CRITICAL RESULT CALLED TO, READ BACK BY AND VERIFIED WITH:  C/ PHARMD C. SHADE 02/09/23 1420 A. LAFRANCE    Neisseria meningitidis NOT DETECTED NOT DETECTED Final   Pseudomonas aeruginosa NOT DETECTED NOT DETECTED Final   Stenotrophomonas maltophilia NOT DETECTED NOT DETECTED Final   Candida albicans NOT DETECTED NOT DETECTED Final   Candida auris NOT DETECTED NOT DETECTED Final   Candida glabrata NOT DETECTED NOT DETECTED Final   Candida krusei NOT DETECTED NOT DETECTED Final   Candida parapsilosis NOT DETECTED NOT DETECTED Final   Candida tropicalis NOT DETECTED NOT DETECTED Final   Cryptococcus neoformans/gattii NOT DETECTED NOT DETECTED Final    Comment: Performed at [facility], [place] [address], [place], [place] [address]  C Difficile Quick Screen w PCR reflex     Status: None   Collection Time: 02/09/23  8:02 AM   Specimen: STOOL  Result Value Ref Range Status   C Diff antigen NEGATIVE NEGATIVE Final   C Diff toxin NEGATIVE NEGATIVE Final   C Diff interpretation No C. difficile detected.  Final    Comment: Performed at [facility]  [place] [address] [place], [place] [address]  Gastrointestinal Panel by PCR , Stool     Status: Abnormal   Collection Time: 02/09/23  8:03 AM   Specimen: STOOL  Result Value Ref Range Status   Campylobacter species NOT DETECTED NOT DETECTED Final   Plesimonas shigelloides NOT DETECTED NOT DETECTED Final   Salmonella species NOT DETECTED NOT DETECTED Final   Yersinia enterocolitica NOT DETECTED NOT DETECTED Final   Vibrio species NOT DETECTED NOT DETECTED Final   Vibrio cholerae NOT DETECTED NOT DETECTED Final   Enteroaggregative E coli (EAEC) NOT DETECTED NOT DETECTED Final   Enteropathogenic E coli (EPEC) NOT DETECTED NOT DETECTED Final   Enterotoxigenic E coli (ETEC) NOT DETECTED NOT DETECTED Final   Shiga like toxin producing E coli (STEC) NOT DETECTED NOT DETECTED Final   Shigella/Enteroinvasive E coli (EIEC) NOT DETECTED NOT DETECTED Final   Cryptosporidium NOT DETECTED NOT DETECTED Final   Cyclospora cayetanensis NOT DETECTED NOT DETECTED Final   Entamoeba histolytica NOT DETECTED NOT DETECTED Final   Giardia lamblia NOT DETECTED NOT DETECTED Final   Adenovirus F40/41 NOT DETECTED NOT DETECTED Final   Astrovirus NOT DETECTED NOT DETECTED Final   Norovirus GI/GII DETECTED (A) NOT DETECTED Final    Comment: RESULT CALLED TO, READ BACK BY AND VERIFIED WITH: AMANDA WATSON RN 02/09/23 2138 MU    Rotavirus A NOT DETECTED NOT DETECTED Final   Sapovirus (I, II, IV, and V) NOT DETECTED NOT DETECTED Final    Comment: Performed at [facility], [address] [place], [place] [address]  Culture, blood (Routine X 2) w Reflex to ID Panel     Status: None (Preliminary result)   Collection Time: 02/12/23  1:50 PM   Specimen: BLOOD RIGHT HAND  Result Value Ref Range Status   Specimen Description   Final    BLOOD RIGHT HAND Performed at [facility], [address] [place], [place] [address]    Special Requests   Final    BOTTLES DRAWN AEROBIC ONLY Blood Culture adequate  volume Performed at [place] [address], [place], [place] [address]    Culture   Final    NO GROWTH 4 DAYS Performed at [facility], [place] [address] [place], [place] [address]    Report Status PENDING  Incomplete  Culture, blood (Routine X 2) w Reflex to ID Panel     Status: None (Preliminary result)   Collection Time: 02/12/23  1:50 PM   Specimen: BLOOD RIGHT ARM  Result Value Ref Range Status   Specimen Description   Final    BLOOD RIGHT ARM Performed at [facility], [place] [address], [place], [place] [address]    Special Requests   Final    BOTTLES DRAWN AEROBIC ONLY Blood Culture adequate volume Performed at [place] [address], [place], [place] [address]    Culture   Final    NO GROWTH 4 DAYS Performed at [facility], [place] [address], [place], [place] [address]    Report Status PENDING  Incomplete  Expectorated Sputum Assessment w Gram Stain, Rflx to Resp Cult     Status: None   Collection Time: 02/12/23  4:08 PM   Specimen: Expectorated Sputum  Result Value Ref Range Status   Specimen Description EXPECTORATED SPUTUM  Final   Special Requests NONE  Final   Sputum evaluation   Final    THIS  SPECIMEN IS ACCEPTABLE FOR SPUTUM CULTURE Performed at [facility], [place] [address], [place], [place] [address]    Report Status 02/12/2023 FINAL  Final  Culture, Respiratory w Gram Stain     Status: None   Collection Time: 02/12/23  4:08 PM  Result Value Ref Range Status   Specimen Description   Final    EXPECTORATED SPUTUM Performed at [place] [address] [place], [place] [address]    Special Requests   Final    NONE Reflexed from [id number] Performed at [facility], [place] [address], [place], [place] [address]    Gram Stain   Final    NO WBC SEEN RARE BUDDING YEAST SEEN Performed at [facility], [place] [address], [place], [place]  [address]    Culture FEW CANDIDA ALBICANS  Final   Report Status 02/14/2023 FINAL  Final  Body fluid culture w Gram Stain     Status: None   Collection Time: 02/12/23  6:45 PM   Specimen: Pleura; Body Fluid  Result Value Ref Range Status   Specimen Description   Final    PLEURAL Performed at [facility], [place] [address], [place], [place] [address]    Special Requests   Final    NONE Performed at [facility], [place] [address], [place], [place] [address]    Gram Stain   Final    FEW WBC PRESENT,BOTH PMN AND MONONUCLEAR NO ORGANISMS SEEN    Culture   Final    NO GROWTH Performed at [facility], [place] [address] [place], [place] [address]    Report Status 02/16/2023 FINAL  Final  Respiratory (~20 pathogens) panel by PCR     Status: None   Collection Time: 02/13/23  8:55 AM   Specimen: Nasopharyngeal Swab; Respiratory  Result Value Ref Range Status   Adenovirus NOT DETECTED NOT DETECTED Final   Coronavirus 229E NOT DETECTED NOT DETECTED Final    Comment: (NOTE) The Coronavirus on the Respiratory Panel, DOES NOT test for the novel  Coronavirus (2019 nCoV)    Coronavirus HKU1 NOT DETECTED NOT DETECTED Final   Coronavirus NL63 NOT DETECTED NOT DETECTED Final   Coronavirus OC43 NOT DETECTED NOT DETECTED Final   Metapneumovirus NOT DETECTED NOT DETECTED Final   Rhinovirus / Enterovirus NOT DETECTED NOT DETECTED Final   Influenza A NOT DETECTED NOT DETECTED Final   Influenza B NOT DETECTED NOT DETECTED Final   Parainfluenza Virus 1 NOT DETECTED NOT DETECTED Final   Parainfluenza Virus 2 NOT DETECTED NOT DETECTED Final   Parainfluenza Virus 3 NOT DETECTED NOT DETECTED Final   Parainfluenza Virus 4 NOT DETECTED NOT DETECTED Final   Respiratory Syncytial Virus NOT DETECTED NOT DETECTED Final   Bordetella pertussis NOT DETECTED NOT DETECTED Final   Bordetella Parapertussis NOT DETECTED NOT DETECTED Final   Chlamydophila pneumoniae NOT DETECTED NOT DETECTED  Final   Mycoplasma pneumoniae NOT DETECTED NOT DETECTED Final    Comment: Performed at [facility], [place], [place], [place] [address]  Resp panel by RT-PCR (RSV, Flu A&B, Covid) Anterior Nasal Swab     Status: None   Collection Time: 02/13/23  8:55 AM   Specimen: Anterior Nasal Swab  Result Value Ref Range Status   SARS Coronavirus 2 by RT PCR NEGATIVE NEGATIVE Final    Comment: (NOTE) SARS-CoV-2 target nucleic acids are NOT DETECTED.  The SARS-CoV-2 RNA is generally detectable in  upper respiratory specimens during the acute phase of infection. The lowest concentration of SARS-CoV-2 viral copies this assay can detect is 138 copies/mL. A negative result does not preclude SARS-Cov-2 infection and should not be used as the sole basis for treatment or other patient management decisions. A negative result may occur with  improper specimen collection/handling, submission of specimen other than nasopharyngeal swab, presence of viral mutation(s) within the areas targeted by this assay, and inadequate number of viral copies(<138 copies/mL). A negative result must be combined with clinical observations, patient history, and epidemiological information. The expected result is Negative.  Fact Sheet for Patients:  EntrepreneurPulse.com.au  Fact Sheet for Healthcare Providers:  IncredibleEmployment.be  This test is no t yet approved or cleared by the [place] FDA and  has been authorized for detection and/or diagnosis of SARS-CoV-2 by FDA under an Emergency Use Authorization (EUA). This EUA will remain  in effect (meaning this test can be used) for the duration of the COVID-19 declaration under Section 564(b)(1) of the Act, 21 U.S.C.section 360bbb-3(b)(1), unless the authorization is terminated  or revoked sooner.       Influenza A by PCR NEGATIVE NEGATIVE Final   Influenza B by PCR NEGATIVE NEGATIVE Final    Comment: (NOTE) The  Xpert Xpress SARS-CoV-2/FLU/RSV plus assay is intended as an aid in the diagnosis of influenza from Nasopharyngeal swab specimens and should not be used as a sole basis for treatment. Nasal washings and aspirates are unacceptable for Xpert Xpress SARS-CoV-2/FLU/RSV testing.  Fact Sheet for Patients: EntrepreneurPulse.com.au  Fact Sheet for Healthcare Providers: IncredibleEmployment.be  This test is not yet approved or cleared by the [place] FDA and has been authorized for detection and/or diagnosis of SARS-CoV-2 by FDA under an Emergency Use Authorization (EUA). This EUA will remain in effect (meaning this test can be used) for the duration of the COVID-19 declaration under Section 564(b)(1) of the Act, 21 U.S.C. section 360bbb-3(b)(1), unless the authorization is terminated or revoked.     Resp Syncytial Virus by PCR NEGATIVE NEGATIVE Final    Comment: (NOTE) Fact Sheet for Patients: EntrepreneurPulse.com.au  Fact Sheet for Healthcare Providers: IncredibleEmployment.be  This test is not yet approved or cleared by the [place] FDA and has been authorized for detection and/or diagnosis of SARS-CoV-2 by FDA under an Emergency Use Authorization (EUA). This EUA will remain in effect (meaning this test can be used) for the duration of the COVID-19 declaration under Section 564(b)(1) of the Act, 21 U.S.C. section 360bbb-3(b)(1), unless the authorization is terminated or revoked.  Performed at [facility], [place] [address] [place], [place] [address]     Labs: CBC: Recent Labs  Lab 02/10/23 0320 02/11/23 0314 02/12/23 1350 02/13/23 0344 02/15/23 0533  WBC 13.6* 14.9* 11.4* 11.1* 12.6*  HGB 11.4* 10.1* 11.6* 11.7* 12.7*  HCT 36.1* 32.4* 37.8* 38.0* 40.7  MCV 90.3 91.3 91.5 90.9 89.3  PLT 177 178 210 213 [id number]   Basic Metabolic Panel: Recent Labs  Lab 02/10/23 1810  02/11/23 0314 02/12/23 1350 02/13/23 0344 02/15/23 0533  NA 134* 136 140 137 136  K 3.5 4.2 2.8* 4.0 3.8  CL 104 106 105 104 102  CO2 '23 23 27 28 27  '$ GLUCOSE 419* 400* 52* 251* 180*  BUN 27* 28* '20 21 23  '$ CREATININE 1.04 1.02 0.83 0.75 0.76  CALCIUM 8.2* 8.3* 8.3* 8.4* 8.4*   Liver Function Tests: No results for input(s): "AST", "ALT", "ALKPHOS", "BILITOT", "PROT", "ALBUMIN" in the last 168  hours. CBG: Recent Labs  Lab 02/14/23 2124 02/15/23 0820 02/15/23 1225 02/15/23 1547 02/15/23 2141  GLUCAP 220* 190* 256* 157* 306*    Discharge time spent: greater than 30 minutes.  Signed: Patrecia Pour, MD [facility] 02/16/2023

## 2023-02-17 LAB — CULTURE, BLOOD (ROUTINE X 2)
Culture: NO GROWTH
Culture: NO GROWTH
Special Requests: ADEQUATE
Special Requests: ADEQUATE

## 2023-02-17 LAB — CYTOLOGY - NON PAP

## 2023-02-20 ENCOUNTER — Ambulatory Visit (HOSPITAL_COMMUNITY)
Admission: RE | Admit: 2023-02-20 | Discharge: 2023-02-20 | Disposition: A | Discharge status: Home / Self Care | Payer: Medicare Other | Source: Ambulatory Visit | Attending: Pulmonary Disease | Admitting: Pulmonary Disease

## 2023-02-20 ENCOUNTER — Ambulatory Visit (INDEPENDENT_AMBULATORY_CARE_PROVIDER_SITE_OTHER): Payer: Medicare Other | Admitting: Pulmonary Disease

## 2023-02-20 ENCOUNTER — Encounter: Payer: Self-pay | Admitting: Pulmonary Disease

## 2023-02-20 ENCOUNTER — Telehealth: Payer: Self-pay

## 2023-02-20 VITALS — BP 128/74 | HR 87 | Ht 68.0 in | Wt 155.0 lb

## 2023-02-20 DIAGNOSIS — J9611 Chronic respiratory failure with hypoxia: Secondary | ICD-10-CM

## 2023-02-20 DIAGNOSIS — C3401 Malignant neoplasm of right main bronchus: Secondary | ICD-10-CM

## 2023-02-20 DIAGNOSIS — J918 Pleural effusion in other conditions classified elsewhere: Secondary | ICD-10-CM

## 2023-02-20 DIAGNOSIS — J189 Pneumonia, unspecified organism: Secondary | ICD-10-CM | POA: Diagnosis not present

## 2023-02-20 MED ORDER — AMOXICILLIN-POT CLAVULANATE 875-125 MG PO TABS: 1.0000 | ORAL_TABLET | Freq: Two times a day (BID) | ORAL | 0 refills | Status: DC

## 2023-02-20 NOTE — Telephone Encounter (Signed)
ATC and LVM w/ Legrand Como @ [employer] regarding the pts home concentrator needing to be serviced/repaired. I left the [place] office number, pts name/DOB and told him to ask for [facility] or [employer].

## 2023-02-20 NOTE — Telephone Encounter (Signed)
Daryl Vega from [employer] called back and let me know that the information the EMS gave pt was inaccurate (per EMS it would take 27moto get a new concentrator). Pts replacement concentrator is already otw and will be delivered to the pts house tomorrow 3/9 via [employer]   MLegrand Comoapologized profusely for that error in communication. Pt is aware the concentrator is otw

## 2023-02-20 NOTE — Patient Instructions (Addendum)
  X CXR today follow up pneumonia & effusion  Continue on antibiotic  COntinue on trelegy Albuterol nebs 3-4 times daily  Keep oxygen sat above 90% Obtain pulse oximeter  To ER if worse

## 2023-02-20 NOTE — Assessment & Plan Note (Signed)
Haemophilus influenza pneumonia. He will finish course of amoxicillin, will extend his course with Augmentin for another week

## 2023-02-20 NOTE — Assessment & Plan Note (Signed)
I am concerned about recurrent cancer, bronchoscopy in December was nondiagnostic.  At some point after current infections are resolved he will need PET scan to assess mediastinal and supraclavicular lymphadenopathy

## 2023-02-20 NOTE — Assessment & Plan Note (Signed)
Chest x-ray repeated today which shows improved aeration on right compared to 3/2 and similar to 2/29. Pleural fluid results consistent with parapneumonic related to pneumonia. There appears to be some layering fluid on current chest x-ray, he had a chronic right effusion in the past. We will set him up for repeat ultrasound-guided thoracentesis

## 2023-02-20 NOTE — Progress Notes (Signed)
Subjective:    Patient ID: Daryl [place], male    DOB: August 18, 1946, 77 y.o.   MRN: [id number]  HPI  77 yo ex-smoker with history of COPD and lung cancer adm 12/11 with shortness of breath for about 1 month.  He was found to be hypoxic to 85% and placed on 2 L nasal cannula He was treated as an outpatient about 2 months ago for pneumonia initially with Augmentin and then with Levaquin. CT angiogram chest showed radiation changes at the right apex with soft tissue density around the right mainstem bronchus and invading into carina through tracheal wall.  This was new compared to his last CT from 04/2020.  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 Right mainstem bronchus was narrowed down extrinsically  ? due to radiation to a small opening which could not be traversed by bronchoscope.      PMH -  COPD -quit smoking 2015, more than 100 pack years Stage IIIb squamous cell lung cancer diagnosed 07/2014 when he presented with mass obstructing right upper lobe and extending into right tracheal wall, T3 N3 M0, involved right supraclavicular lymph nodes, s/p chemo 11/2014 ,underwent palliative radiation to right upper lobe with Dr. [name] 08/2014    -Area of right upper lobe consolidation evaluated by PET/CT 05/2020, no hypermetabolism   -Pulmonary embolism, discontinued apixaban 2018 -Diabetes type 2 -Chemotherapy-induced neuropathy -Cervical spondylosis without myelopathy 06/23/2016    Chief Complaint  Patient presents with   Follow-up    Pt HFU he was admitted for PNA he signed out [place] because we wasn't happy with his care at [facility]. Son reports that the only reason he signed out AMA is because pt wasn't getting his way.  He is currently using 5L POC but his concentrator at home is no longer working.    1 month follow-up visit. He was hospitalized 2/25 for community acquired pneumonia and found to have haemophilus  bacteremia On 2/29, hypoxia worsened. CTA chest shows worsening consolidation and pleural effusion. PCCM consulted, 600cc bloody fluid pulled at time of thoracentesis , this showed 1800 white cells/52% segs, LDH was 161 with protein 3.5, cytology negative. He was also treated for Norovirus gastroenteritis:  Chest x-ray 3/2 continues to show large right effusion/right lower lobe consolidation. He signed himself out [place]. He arrives accompanied by his son today.  He is using 5 L POC/[employer].  He states that his home tank is not working, it goes up to 5 L continuous-he has contacted [employer] and was given at 3 to 5-day turnaround He was given amoxicillin prescription at discharge which he has been taking Complains of shortness of breath on minimal activity  I reviewed his last 2 course, discharge summary.  Imaging in detail  Significant tests/ events reviewed  CTc hest 02/02/23 >> new enlarged 1.3 cm right supraclavicular node ,new left prevascular adenopathy up to 1.0 cm  CT chest 10/11 Right supraclavicular 11 mm node ,11 mm lower right paratracheal node  Soft tissue density in the right hilum, with severe narrowing of the right mainstem bronchus and variable narrowing of additional proximal bronchi. Irregular, medial consolidation in the right upper lobe, associated with bronchiectasis and architectural distortion. Small right pleural effusion and subpleural reticular fibrosis    Bronchoscopy 11/25/22 only showed thick mucus at the carina, no tracheal or endobronchial lesions. Significant narrowing of the right upper lobe bronchus which could not be traversed by bronchoscope, due  to radiation scarring  BAL neg, brushings >. Atypical cells   Spirometry 10/2022 moderate airway obstruction, ratio 61, FEV1 1.39, FVC 2.29     Review of Systems neg for any significant sore throat, dysphagia, itching, sneezing, nasal congestion or excess/ purulent secretions, fever, chills, sweats,  unintended wt loss, pleuritic or exertional cp, hempoptysis, orthopnea pnd or change in chronic leg swelling. Also denies presyncope, palpitations, heartburn, abdominal pain, nausea, vomiting, diarrhea or change in bowel or urinary habits, dysuria,hematuria, rash, arthralgias, visual complaints, headache, numbness weakness or ataxia.     Objective:   Physical Exam  Gen. Pleasant, well-nourished, in no distress ENT - no thrush, no pallor/icterus,no post nasal drip Neck: No JVD, no thyromegaly, no carotid bruits Lungs: no use of accessory muscles, no dullness to percussion, decreased breath sounds on right without rales or rhonchi  Cardiovascular: Rhythm regular, heart sounds  normal, no murmurs or gallops, no peripheral edema Musculoskeletal: No deformities, no cyanosis or clubbing        Assessment & Plan:

## 2023-02-20 NOTE — Telephone Encounter (Signed)
1 

## 2023-02-20 NOTE — Assessment & Plan Note (Signed)
Unfortunately he prematurely signed himself out AMA.  Son states he is "hard headed".  He states that he will not go back to [place] long but is willing to go back to any pain. His home oxygen concentrator is not working.  We will call [employer] and see if they can expedite replacement.  I have asked him to maintain oxygen saturation above 90%, he is currently using 6 L POC.  I have asked him to go to the emergency room if he feels worse or if he is unable to maintain his oxygen saturation

## 2023-02-24 ENCOUNTER — Ambulatory Visit (HOSPITAL_COMMUNITY)
Admission: RE | Admit: 2023-02-24 | Discharge: 2023-02-24 | Disposition: A | Discharge status: Home / Self Care | Payer: Medicare Other | Source: Ambulatory Visit | Attending: Pulmonary Disease | Admitting: Pulmonary Disease

## 2023-02-24 NOTE — Progress Notes (Signed)
Patient contacted via telephone to see if was coming today for his thoracentesis procedure and he stated "I didn't know anything about an appointment today." Patient provided with centralized scheduling phone number so he could reschedule his thoracentesis.

## 2023-02-27 ENCOUNTER — Other Ambulatory Visit: Payer: Self-pay | Admitting: Pulmonary Disease

## 2023-02-27 ENCOUNTER — Ambulatory Visit (HOSPITAL_COMMUNITY)
Admission: RE | Admit: 2023-02-27 | Discharge: 2023-02-27 | Disposition: A | Discharge status: Home / Self Care | Payer: Medicare Other | Source: Ambulatory Visit | Attending: Pulmonary Disease | Admitting: Pulmonary Disease

## 2023-02-27 DIAGNOSIS — J918 Pleural effusion in other conditions classified elsewhere: Secondary | ICD-10-CM | POA: Diagnosis present

## 2023-02-27 DIAGNOSIS — J189 Pneumonia, unspecified organism: Secondary | ICD-10-CM

## 2023-02-27 DIAGNOSIS — C3401 Malignant neoplasm of right main bronchus: Secondary | ICD-10-CM

## 2023-02-27 DIAGNOSIS — J9611 Chronic respiratory failure with hypoxia: Secondary | ICD-10-CM

## 2023-03-09 ENCOUNTER — Telehealth (HOSPITAL_BASED_OUTPATIENT_CLINIC_OR_DEPARTMENT_OTHER): Payer: Self-pay | Admitting: Pulmonary Disease

## 2023-03-09 MED ORDER — TRELEGY ELLIPTA 100-62.5-25 MCG/ACT IN AEPB: 1.0000 | INHALATION_SPRAY | Freq: Every day | RESPIRATORY_TRACT | 5 refills | Status: DC

## 2023-03-09 NOTE — Telephone Encounter (Signed)
Pt. Calling to get refill called to pharmacy for [facility]

## 2023-03-09 NOTE — Telephone Encounter (Signed)
Refills sent!! Nothing further needed  ?

## 2023-03-18 ENCOUNTER — Ambulatory Visit (HOSPITAL_COMMUNITY)
Admission: RE | Admit: 2023-03-18 | Discharge: 2023-03-18 | Disposition: A | Discharge status: Home / Self Care | Payer: Medicare Other | Source: Ambulatory Visit | Attending: Pulmonary Disease | Admitting: Pulmonary Disease

## 2023-03-18 ENCOUNTER — Ambulatory Visit: Payer: Medicare Other | Admitting: Pulmonary Disease

## 2023-03-18 ENCOUNTER — Encounter: Payer: Self-pay | Admitting: Pulmonary Disease

## 2023-03-18 VITALS — BP 134/84 | HR 120 | Ht 68.0 in | Wt 153.0 lb

## 2023-03-18 DIAGNOSIS — C3411 Malignant neoplasm of upper lobe, right bronchus or lung: Secondary | ICD-10-CM | POA: Diagnosis not present

## 2023-03-18 DIAGNOSIS — J189 Pneumonia, unspecified organism: Secondary | ICD-10-CM

## 2023-03-18 DIAGNOSIS — J9611 Chronic respiratory failure with hypoxia: Secondary | ICD-10-CM

## 2023-03-18 DIAGNOSIS — J449 Chronic obstructive pulmonary disease, unspecified: Secondary | ICD-10-CM

## 2023-03-18 DIAGNOSIS — J918 Pleural effusion in other conditions classified elsewhere: Secondary | ICD-10-CM

## 2023-03-18 NOTE — Assessment & Plan Note (Signed)
Continue 5 L POC when walking and 5 L continuous at home and during sleep

## 2023-03-18 NOTE — Assessment & Plan Note (Signed)
I continue to have some concern for recurrent malignancy even though bronchoscopy, thoracentesis has been negative.  We will arrange for PET scan in 2 to 4 weeks after infection is fully resolved

## 2023-03-18 NOTE — Assessment & Plan Note (Signed)
Ultrasound did not show significant fluid to tap again.  Repeat chest x-ray today continues to show poor aeration on right. Some of this may be related to resolving consolidation -a lot of this is previous scarring from his radiation

## 2023-03-18 NOTE — Progress Notes (Signed)
Subjective:    Patient ID: Daryl Vega, male    DOB: September 20, 1946, 77 y.o.   MRN: [id number]  HPI  77 yo ex-smoker with history of COPD and lung cancer adm 12/11 with shortness of breath for about 1 month.  He was found to be hypoxic to 85% and placed on 2 L nasal cannula He was treated as an outpatient about 2 months ago for pneumonia initially with Augmentin and then with Levaquin. CT angiogram chest showed radiation changes at the right apex with soft tissue density around the right mainstem bronchus and invading into carina through tracheal wall.  This was new compared to his last CT from 04/2020.  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 Right mainstem bronchus was narrowed down extrinsically  ? due to radiation to a small opening which could not be traversed by bronchoscope.      PMH -  COPD -quit smoking 2015, more than 100 pack years Stage IIIb squamous cell lung cancer diagnosed 07/2014 when he presented with mass obstructing right upper lobe & 50% BI and extending into right tracheal wall, T3 N3 M0, involved right supraclavicular lymph nodes, s/p chemo 11/2014 ,underwent palliative radiation to right upper lobe with Dr. [name] 08/2014    -Area of right upper lobe consolidation evaluated by PET/CT 05/2020, no hypermetabolism   -Pulmonary embolism, discontinued apixaban 2018 -Diabetes type 2 -Chemotherapy-induced neuropathy -Cervical spondylosis without myelopathy 06/23/2016   Chief Complaint  Patient presents with   Follow-up    Breathing not doing well  States he takes off O2 sometimes    He was hospitalized 2/25 for community acquired pneumonia and found to have haemophilus bacteremia, underwent thoracentesis, neutrophilic exudate  He was also treated for Norovirus gastroenteritis:  At his last visit 3/8 we obtained Chest x-ray which showed improved aeration on right and suggested layering effusion.   However Ultrasound chest on 3/15 did not show significant effusion  He continues to complain of shortness of breath, is using his oxygen more although still stays off oxygen as much as he can per his son who accompanies and corroborates history today.  He was provided with a new concentrator by [employer] and he feels this is not working as well as the old one.  It goes up to 5 L and he has a long cord.  On ambulation today on 4 L pulse, oxygen saturation dropped to 87% but maintained on 5 L pulse  Significant tests/ events reviewed  01/2023 RT Thoracentesis 600cc bloody fluid ,1800 white cells/52% segs, LDH was 161 with protein 3.5, cytology negative.   CTc hest 02/02/23 >> new enlarged 1.3 cm right supraclavicular node ,new left prevascular adenopathy up to 1.0 cm  CT chest 10/11 Right supraclavicular 11 mm node ,11 mm lower right paratracheal node  Soft tissue density in the right hilum, with severe narrowing of the right mainstem bronchus and variable narrowing of additional proximal bronchi. Irregular, medial consolidation in the right upper lobe, associated with bronchiectasis and architectural distortion. Small right pleural effusion and subpleural reticular fibrosis    Bronchoscopy 11/25/22 only showed thick mucus at the carina, no tracheal or endobronchial lesions. Significant narrowing of the right upper lobe bronchus which could not be traversed by bronchoscope, due to radiation scarring  BAL neg, brushings >. Atypical cells   Spirometry 10/2022 moderate airway obstruction, ratio 61, FEV1 1.39, FVC 2.29  Review of Systems neg for any significant sore throat,  dysphagia, itching, sneezing, nasal congestion or excess/ purulent secretions, fever, chills, sweats, unintended wt loss, pleuritic or exertional cp, hempoptysis, orthopnea pnd or change in chronic leg swelling. Also denies presyncope, palpitations, heartburn, abdominal pain, nausea, vomiting, diarrhea or change in bowel or urinary  habits, dysuria,hematuria, rash, arthralgias, visual complaints, headache, numbness weakness or ataxia.     Objective:   Physical Exam  Gen. Pleasant, well-nourished, in no distress ENT - no thrush, no pallor/icterus,no post nasal drip Neck: No JVD, no thyromegaly, no carotid bruits Lungs: no use of accessory muscles, no dullness to percussion, decreased on right without rales or rhonchi  Cardiovascular: Rhythm regular, heart sounds  normal, no murmurs or gallops, no peripheral edema Musculoskeletal: No deformities, no cyanosis or clubbing        Assessment & Plan:

## 2023-03-18 NOTE — Patient Instructions (Signed)
X CXR today to follow up pneumonia  Based on this, we will schedule a PET scan

## 2023-03-18 NOTE — Assessment & Plan Note (Signed)
Continue Trelegy. Use albuterol as needed for rescue

## 2023-03-30 ENCOUNTER — Encounter (HOSPITAL_COMMUNITY)
Admission: RE | Admit: 2023-03-30 | Discharge: 2023-03-30 | Disposition: A | Discharge status: Home / Self Care | Payer: Medicare Other | Source: Ambulatory Visit | Attending: Pulmonary Disease | Admitting: Pulmonary Disease

## 2023-03-30 DIAGNOSIS — C3411 Malignant neoplasm of upper lobe, right bronchus or lung: Secondary | ICD-10-CM | POA: Insufficient documentation

## 2023-03-30 LAB — GLUCOSE, CAPILLARY: Glucose-Capillary: 96 mg/dL (ref 70–99)

## 2023-03-30 MED ORDER — FLUDEOXYGLUCOSE F - 18 (FDG) INJECTION
7.5000 | Freq: Once | INTRAVENOUS | Status: AC | PRN
  Administered 2023-03-30: 7.7 via INTRAVENOUS

## 2023-04-29 ENCOUNTER — Ambulatory Visit: Payer: Medicare Other | Admitting: Pulmonary Disease

## 2023-04-29 ENCOUNTER — Encounter: Payer: Self-pay | Admitting: Pulmonary Disease

## 2023-04-29 VITALS — BP 112/65 | HR 76 | Ht 68.0 in | Wt 157.0 lb

## 2023-04-29 DIAGNOSIS — J9611 Chronic respiratory failure with hypoxia: Secondary | ICD-10-CM | POA: Diagnosis not present

## 2023-04-29 DIAGNOSIS — J449 Chronic obstructive pulmonary disease, unspecified: Secondary | ICD-10-CM | POA: Diagnosis not present

## 2023-04-29 DIAGNOSIS — R918 Other nonspecific abnormal finding of lung field: Secondary | ICD-10-CM

## 2023-04-29 NOTE — Assessment & Plan Note (Signed)
I will treat him for a COPD exacerbation with prednisone course starting at 40 mg and tapering over 2 weeks. He will continue on Trelegy.  He wanted to trial alternative medication so we will give him a sample of Breztri. He has albuterol nebs to use on an as-needed basis

## 2023-04-29 NOTE — Progress Notes (Signed)
Subjective:    Patient ID: Daryl Vega, male    DOB: Nov 07, 1946, 77 y.o.   MRN: [id number]  HPI  77 yo ex-smoker for follow-up of COPD , chronic hypoxic respiratory failure and lung cancer   adm 11/24/22 with shortness of breath and hypoxia CT angiogram chest showed radiation changes at the right apex with soft tissue density around the right mainstem bronchus and invading into carina through tracheal wall.  This was new compared to his last CT from 04/2020.  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 Right mainstem bronchus was narrowed down extrinsically  ? due to radiation to a small opening which could not be traversed by bronchoscope.      PMH -  COPD -quit smoking 2015, more than 100 pack years Stage IIIb squamous cell lung cancer diagnosed 07/2014 when he presented with mass obstructing right upper lobe & 50% BI and extending into right tracheal wall, T3 N3 M0, involved right supraclavicular lymph nodes, s/p chemo 11/2014 ,underwent palliative radiation to right upper lobe with Dr. Roselind Messier 08/2014    -Area of right upper lobe consolidation evaluated by PET/CT 05/2020, no hypermetabolism   -Pulmonary embolism, discontinued apixaban 2018 -Diabetes type 2 -Chemotherapy-induced neuropathy -Cervical spondylosis without myelopathy 06/23/2016   He was hospitalized 02/08/23 for community acquired pneumonia and haemophilus bacteremia, underwent thoracentesis, neutrophilic exudate   03/2023 On ambulation  on 4 L pulse, oxygen saturation dropped to 87% but maintained on 5 L pulse   Chief Complaint  Patient presents with   Follow-up    Pt f/u states that his breathing is baseline, did receive his replacement concentrator    He is brought in by his neighbor today.  He is using oxygen almost all the time now between 4 and 5 L.  He reports shortness of breath on walking short distances.  He received a replacement  concentrator from [employer] He denies cough or hemoptysis or weight loss Oxygen saturation was 82% on arrival on room air and improved on 4 L. We reviewed PET scan results last chest x-ray  Significant tests/ events reviewed  PET 03/2023  new 1.1 cm nodule within the posterior left upper lobe which has an SUV max of 0.87. Right supraclavicular lymph node is enlarged measuring 1.2 cm and has an SUV max of 3.28  01/2023 RT Thoracentesis 600cc bloody fluid ,1800 white cells/52% segs, LDH was 161 with protein 3.5, cytology negative.     CTc hest 02/02/23 >> new enlarged 1.3 cm right supraclavicular node ,new left prevascular adenopathy up to 1.0 cm  CT chest 10/11 Right supraclavicular 11 mm node ,11 mm lower right paratracheal node  Soft tissue density in the right hilum, with severe narrowing of the right mainstem bronchus and variable narrowing of additional proximal bronchi. Irregular, medial consolidation in the right upper lobe, associated with bronchiectasis and architectural distortion. Small right pleural effusion and subpleural reticular fibrosis    Bronchoscopy 11/25/22 only showed thick mucus at the carina, no tracheal or endobronchial lesions. Significant narrowing of the right upper lobe bronchus which could not be traversed by bronchoscope, due to radiation scarring  BAL neg, brushings >. Atypical cells   Spirometry 10/2022 moderate airway obstruction, ratio 61, FEV1 1.39, FVC 2.29   Review of Systems neg for any significant sore throat, dysphagia, itching, sneezing, nasal congestion or excess/ purulent secretions, fever, chills, sweats, unintended wt loss, pleuritic or exertional cp, hempoptysis, orthopnea pnd or change  in chronic leg swelling. Also denies presyncope, palpitations, heartburn, abdominal pain, nausea, vomiting, diarrhea or change in bowel or urinary habits, dysuria,hematuria, rash, arthralgias, visual complaints, headache, numbness weakness or ataxia.     Objective:    Physical Exam  Gen. Pleasant, well-nourished, in no distress, normal affect ENT - no pallor,icterus, no post nasal drip Neck: No JVD, no thyromegaly, no carotid bruits Lungs: no use of accessory muscles, no dullness to percussion, decreased on right without rales , scattered rhonchi on left Cardiovascular: Rhythm regular, heart sounds  normal, no murmurs or gallops, no peripheral edema Abdomen: soft and non-tender, no hepatosplenomegaly, BS normal. Musculoskeletal: No deformities, no cyanosis or clubbing Neuro:  alert, non focal       Assessment & Plan:

## 2023-04-29 NOTE — Assessment & Plan Note (Signed)
I remain concerned about recurrent malignancy even though bronchoscopy and thoracentesis have not demonstrated.  PET scan shows slight enlargement of right supraclavicular lymph node.  We discussed doing FNA biopsy of this under ultrasound guidance to look for recurrent cancer although I am not sure he would be a candidate for more treatment

## 2023-04-29 NOTE — Assessment & Plan Note (Signed)
Continue 4 L POC at all times His compliance is increasing

## 2023-04-29 NOTE — Patient Instructions (Addendum)
X US guided biopsy of RT supraclavicular lymph gland  X Prednisone 10 mg tabs Take 4 tabs  daily with food x 4 days, then 3 tabs daily x 4 days, then 2 tabs daily x 4 days, then 1 tab daily x4 days then stop. #40  Sample of Breztri - 2 puffs twice daily

## 2023-05-31 ENCOUNTER — Emergency Department (HOSPITAL_COMMUNITY): Payer: Medicare Other

## 2023-05-31 ENCOUNTER — Encounter (HOSPITAL_COMMUNITY): Payer: Self-pay | Admitting: Emergency Medicine

## 2023-05-31 ENCOUNTER — Other Ambulatory Visit (HOSPITAL_COMMUNITY): Payer: Self-pay | Admitting: *Deleted

## 2023-05-31 ENCOUNTER — Inpatient Hospital Stay (HOSPITAL_COMMUNITY): Payer: Medicare Other

## 2023-05-31 ENCOUNTER — Inpatient Hospital Stay (HOSPITAL_COMMUNITY)
Admission: EM | Admit: 2023-05-31 | Discharge: 2023-06-06 | DRG: 180 | Disposition: A | Discharge status: Hospice | Payer: Medicare Other | Attending: Internal Medicine | Admitting: Internal Medicine

## 2023-05-31 ENCOUNTER — Other Ambulatory Visit: Payer: Self-pay

## 2023-05-31 DIAGNOSIS — Z85118 Personal history of other malignant neoplasm of bronchus and lung: Secondary | ICD-10-CM

## 2023-05-31 DIAGNOSIS — J9621 Acute and chronic respiratory failure with hypoxia: Secondary | ICD-10-CM | POA: Diagnosis present

## 2023-05-31 DIAGNOSIS — Z66 Do not resuscitate: Secondary | ICD-10-CM | POA: Diagnosis present

## 2023-05-31 DIAGNOSIS — Z9981 Dependence on supplemental oxygen: Secondary | ICD-10-CM | POA: Diagnosis not present

## 2023-05-31 DIAGNOSIS — C3491 Malignant neoplasm of unspecified part of right bronchus or lung: Secondary | ICD-10-CM | POA: Diagnosis not present

## 2023-05-31 DIAGNOSIS — R918 Other nonspecific abnormal finding of lung field: Secondary | ICD-10-CM | POA: Diagnosis present

## 2023-05-31 DIAGNOSIS — C3411 Malignant neoplasm of upper lobe, right bronchus or lung: Principal | ICD-10-CM | POA: Diagnosis present

## 2023-05-31 DIAGNOSIS — G620 Drug-induced polyneuropathy: Secondary | ICD-10-CM | POA: Diagnosis present

## 2023-05-31 DIAGNOSIS — Z8249 Family history of ischemic heart disease and other diseases of the circulatory system: Secondary | ICD-10-CM

## 2023-05-31 DIAGNOSIS — Z1152 Encounter for screening for COVID-19: Secondary | ICD-10-CM

## 2023-05-31 DIAGNOSIS — C3490 Malignant neoplasm of unspecified part of unspecified bronchus or lung: Secondary | ICD-10-CM | POA: Diagnosis present

## 2023-05-31 DIAGNOSIS — T451X5A Adverse effect of antineoplastic and immunosuppressive drugs, initial encounter: Secondary | ICD-10-CM | POA: Diagnosis present

## 2023-05-31 DIAGNOSIS — K3184 Gastroparesis: Secondary | ICD-10-CM | POA: Diagnosis present

## 2023-05-31 DIAGNOSIS — I5021 Acute systolic (congestive) heart failure: Secondary | ICD-10-CM | POA: Diagnosis not present

## 2023-05-31 DIAGNOSIS — E7800 Pure hypercholesterolemia, unspecified: Secondary | ICD-10-CM | POA: Diagnosis present

## 2023-05-31 DIAGNOSIS — I3139 Other pericardial effusion (noninflammatory): Secondary | ICD-10-CM | POA: Diagnosis present

## 2023-05-31 DIAGNOSIS — T380X5A Adverse effect of glucocorticoids and synthetic analogues, initial encounter: Secondary | ICD-10-CM | POA: Diagnosis not present

## 2023-05-31 DIAGNOSIS — L89152 Pressure ulcer of sacral region, stage 2: Secondary | ICD-10-CM | POA: Diagnosis present

## 2023-05-31 DIAGNOSIS — Z86711 Personal history of pulmonary embolism: Secondary | ICD-10-CM

## 2023-05-31 DIAGNOSIS — J9601 Acute respiratory failure with hypoxia: Secondary | ICD-10-CM

## 2023-05-31 DIAGNOSIS — Z7951 Long term (current) use of inhaled steroids: Secondary | ICD-10-CM

## 2023-05-31 DIAGNOSIS — Z801 Family history of malignant neoplasm of trachea, bronchus and lung: Secondary | ICD-10-CM

## 2023-05-31 DIAGNOSIS — E1142 Type 2 diabetes mellitus with diabetic polyneuropathy: Secondary | ICD-10-CM | POA: Diagnosis present

## 2023-05-31 DIAGNOSIS — Z515 Encounter for palliative care: Secondary | ICD-10-CM

## 2023-05-31 DIAGNOSIS — J441 Chronic obstructive pulmonary disease with (acute) exacerbation: Secondary | ICD-10-CM | POA: Diagnosis present

## 2023-05-31 DIAGNOSIS — I429 Cardiomyopathy, unspecified: Secondary | ICD-10-CM | POA: Diagnosis not present

## 2023-05-31 DIAGNOSIS — Z923 Personal history of irradiation: Secondary | ICD-10-CM

## 2023-05-31 DIAGNOSIS — Z85820 Personal history of malignant melanoma of skin: Secondary | ICD-10-CM

## 2023-05-31 DIAGNOSIS — D630 Anemia in neoplastic disease: Secondary | ICD-10-CM | POA: Diagnosis present

## 2023-05-31 DIAGNOSIS — E1143 Type 2 diabetes mellitus with diabetic autonomic (poly)neuropathy: Secondary | ICD-10-CM | POA: Diagnosis present

## 2023-05-31 DIAGNOSIS — Z79899 Other long term (current) drug therapy: Secondary | ICD-10-CM

## 2023-05-31 DIAGNOSIS — D638 Anemia in other chronic diseases classified elsewhere: Secondary | ICD-10-CM | POA: Diagnosis present

## 2023-05-31 DIAGNOSIS — J449 Chronic obstructive pulmonary disease, unspecified: Secondary | ICD-10-CM | POA: Diagnosis present

## 2023-05-31 DIAGNOSIS — Z781 Physical restraint status: Secondary | ICD-10-CM

## 2023-05-31 DIAGNOSIS — R627 Adult failure to thrive: Secondary | ICD-10-CM | POA: Diagnosis present

## 2023-05-31 DIAGNOSIS — G9341 Metabolic encephalopathy: Secondary | ICD-10-CM | POA: Diagnosis not present

## 2023-05-31 DIAGNOSIS — Z7984 Long term (current) use of oral hypoglycemic drugs: Secondary | ICD-10-CM

## 2023-05-31 DIAGNOSIS — Z808 Family history of malignant neoplasm of other organs or systems: Secondary | ICD-10-CM

## 2023-05-31 DIAGNOSIS — R0609 Other forms of dyspnea: Secondary | ICD-10-CM | POA: Diagnosis not present

## 2023-05-31 DIAGNOSIS — Z7189 Other specified counseling: Secondary | ICD-10-CM | POA: Diagnosis not present

## 2023-05-31 DIAGNOSIS — E611 Iron deficiency: Secondary | ICD-10-CM | POA: Diagnosis present

## 2023-05-31 DIAGNOSIS — I5043 Acute on chronic combined systolic (congestive) and diastolic (congestive) heart failure: Secondary | ICD-10-CM | POA: Diagnosis present

## 2023-05-31 DIAGNOSIS — R531 Weakness: Secondary | ICD-10-CM | POA: Diagnosis not present

## 2023-05-31 DIAGNOSIS — D386 Neoplasm of uncertain behavior of respiratory organ, unspecified: Secondary | ICD-10-CM

## 2023-05-31 DIAGNOSIS — E1165 Type 2 diabetes mellitus with hyperglycemia: Secondary | ICD-10-CM | POA: Diagnosis not present

## 2023-05-31 DIAGNOSIS — Z87891 Personal history of nicotine dependence: Secondary | ICD-10-CM

## 2023-05-31 LAB — CBC WITH DIFFERENTIAL/PLATELET
Abs Immature Granulocytes: 0.06 10*3/uL (ref 0.00–0.07)
Basophils Absolute: 0.1 10*3/uL (ref 0.0–0.1)
Basophils Relative: 1 %
Eosinophils Absolute: 0 10*3/uL (ref 0.0–0.5)
Eosinophils Relative: 0 %
HCT: 40.9 % (ref 39.0–52.0)
Hemoglobin: 12.7 g/dL — ABNORMAL LOW (ref 13.0–17.0)
Immature Granulocytes: 1 %
Lymphocytes Relative: 15 %
Lymphs Abs: 1.8 10*3/uL (ref 0.7–4.0)
MCH: 28.5 pg (ref 26.0–34.0)
MCHC: 31.1 g/dL (ref 30.0–36.0)
MCV: 91.7 fL (ref 80.0–100.0)
Monocytes Absolute: 0.8 10*3/uL (ref 0.1–1.0)
Monocytes Relative: 7 %
Neutro Abs: 9 10*3/uL — ABNORMAL HIGH (ref 1.7–7.7)
Neutrophils Relative %: 76 %
Platelets: 186 10*3/uL (ref 150–400)
RBC: 4.46 MIL/uL (ref 4.22–5.81)
RDW: 19.9 % — ABNORMAL HIGH (ref 11.5–15.5)
WBC: 11.7 10*3/uL — ABNORMAL HIGH (ref 4.0–10.5)
nRBC: 0 % (ref 0.0–0.2)

## 2023-05-31 LAB — ECHOCARDIOGRAM LIMITED
Area-P 1/2: 6.96 cm2
Est EF: 25
Height: 69 in
Weight: 2720 oz

## 2023-05-31 LAB — BASIC METABOLIC PANEL
Anion gap: 10 (ref 5–15)
BUN: 34 mg/dL — ABNORMAL HIGH (ref 8–23)
CO2: 22 mmol/L (ref 22–32)
Calcium: 9.5 mg/dL (ref 8.9–10.3)
Chloride: 106 mmol/L (ref 98–111)
Creatinine, Ser: 1.16 mg/dL (ref 0.61–1.24)
GFR, Estimated: >60 mL/min (ref >60)
Glucose, Bld: 90 mg/dL (ref 70–99)
Potassium: 4.3 mmol/L (ref 3.5–5.1)
Sodium: 138 mmol/L (ref 135–145)

## 2023-05-31 LAB — GLUCOSE, CAPILLARY
Glucose-Capillary: 139 mg/dL — ABNORMAL HIGH (ref 70–99)
Glucose-Capillary: 205 mg/dL — ABNORMAL HIGH (ref 70–99)

## 2023-05-31 LAB — TROPONIN I (HIGH SENSITIVITY)
Troponin I (High Sensitivity): 33 ng/L — ABNORMAL HIGH (ref <18)
Troponin I (High Sensitivity): 45 ng/L — ABNORMAL HIGH (ref <18)

## 2023-05-31 LAB — LACTIC ACID, PLASMA
Lactic Acid, Venous: 2.6 mmol/L (ref 0.5–1.9)
Lactic Acid, Venous: 2.6 mmol/L (ref 0.5–1.9)

## 2023-05-31 LAB — SARS CORONAVIRUS 2 BY RT PCR: SARS Coronavirus 2 by RT PCR: NEGATIVE

## 2023-05-31 LAB — BRAIN NATRIURETIC PEPTIDE: B Natriuretic Peptide: 1235 pg/mL — ABNORMAL HIGH (ref 0.0–100.0)

## 2023-05-31 LAB — CULTURE, BLOOD (ROUTINE X 2)

## 2023-05-31 MED ORDER — FLUTICASONE FUROATE-VILANTEROL 100-25 MCG/ACT IN AEPB
1.0000 | INHALATION_SPRAY | Freq: Every day | RESPIRATORY_TRACT | Status: DC
  Filled 2023-05-31: qty 28

## 2023-05-31 MED ORDER — BISACODYL 10 MG RE SUPP: 10.0000 mg | Freq: Every day | RECTAL | Status: DC | PRN

## 2023-05-31 MED ORDER — SODIUM CHLORIDE 0.9 % IV SOLN
1.0000 g | INTRAVENOUS | Status: DC
  Administered 2023-06-01 – 2023-06-04 (×4): 1 g via INTRAVENOUS
  Filled 2023-05-31 (×5): qty 10

## 2023-05-31 MED ORDER — ALBUTEROL SULFATE (2.5 MG/3ML) 0.083% IN NEBU: 2.5000 mg | INHALATION_SOLUTION | RESPIRATORY_TRACT | Status: DC | PRN

## 2023-05-31 MED ORDER — ACETAMINOPHEN 325 MG PO TABS
650.0000 mg | ORAL_TABLET | Freq: Four times a day (QID) | ORAL | Status: DC | PRN
  Administered 2023-06-06: 650 mg via ORAL
  Filled 2023-05-31: qty 2

## 2023-05-31 MED ORDER — POLYETHYLENE GLYCOL 3350 17 G PO PACK: 17.0000 g | PACK | Freq: Every day | ORAL | Status: DC | PRN

## 2023-05-31 MED ORDER — ENOXAPARIN SODIUM 40 MG/0.4ML IJ SOSY
40.0000 mg | PREFILLED_SYRINGE | INTRAMUSCULAR | Status: DC
  Administered 2023-05-31 – 2023-06-05 (×6): 40 mg via SUBCUTANEOUS
  Filled 2023-05-31 (×6): qty 0.4

## 2023-05-31 MED ORDER — GABAPENTIN 300 MG PO CAPS
900.0000 mg | ORAL_CAPSULE | Freq: Three times a day (TID) | ORAL | Status: DC
  Administered 2023-05-31 – 2023-06-01 (×3): 900 mg via ORAL
  Filled 2023-05-31 (×4): qty 3

## 2023-05-31 MED ORDER — ROSUVASTATIN CALCIUM 20 MG PO TABS
20.0000 mg | ORAL_TABLET | Freq: Every day | ORAL | Status: DC
  Administered 2023-05-31 – 2023-06-04 (×5): 20 mg via ORAL
  Filled 2023-05-31 (×5): qty 1

## 2023-05-31 MED ORDER — MIRTAZAPINE 15 MG PO TABS
7.5000 mg | ORAL_TABLET | Freq: Every day | ORAL | Status: DC
  Administered 2023-05-31 – 2023-06-05 (×6): 7.5 mg via ORAL
  Filled 2023-05-31 (×6): qty 1

## 2023-05-31 MED ORDER — METHYLPREDNISOLONE SODIUM SUCC 40 MG IJ SOLR: 40.0000 mg | Freq: Two times a day (BID) | INTRAMUSCULAR | Status: DC

## 2023-05-31 MED ORDER — METHYLPREDNISOLONE SODIUM SUCC 40 MG IJ SOLR
40.0000 mg | Freq: Two times a day (BID) | INTRAMUSCULAR | Status: DC
  Administered 2023-05-31 – 2023-06-01 (×3): 40 mg via INTRAVENOUS
  Filled 2023-05-31 (×3): qty 1

## 2023-05-31 MED ORDER — FENTANYL CITRATE PF 50 MCG/ML IJ SOSY
25.0000 ug | PREFILLED_SYRINGE | INTRAMUSCULAR | Status: DC | PRN
  Administered 2023-05-31 – 2023-06-03 (×8): 25 ug via INTRAVENOUS
  Filled 2023-05-31 (×8): qty 1

## 2023-05-31 MED ORDER — HEPARIN (PORCINE) 25000 UT/250ML-% IV SOLN
1250.0000 [IU]/h | INTRAVENOUS | Status: DC
  Administered 2023-05-31: 1250 [IU]/h via INTRAVENOUS
  Filled 2023-05-31: qty 250

## 2023-05-31 MED ORDER — INSULIN ASPART 100 UNIT/ML IJ SOLN
0.0000 [IU] | Freq: Every day | INTRAMUSCULAR | Status: DC
  Administered 2023-05-31: 2 [IU] via SUBCUTANEOUS
  Administered 2023-06-03: 3 [IU] via SUBCUTANEOUS
  Administered 2023-06-04: 5 [IU] via SUBCUTANEOUS

## 2023-05-31 MED ORDER — ONDANSETRON HCL 4 MG/2ML IJ SOLN
4.0000 mg | Freq: Four times a day (QID) | INTRAMUSCULAR | Status: DC | PRN
  Administered 2023-05-31: 4 mg via INTRAVENOUS
  Filled 2023-05-31: qty 2

## 2023-05-31 MED ORDER — SODIUM CHLORIDE 0.9 % IV SOLN: INTRAVENOUS | Status: DC | PRN

## 2023-05-31 MED ORDER — ACETAMINOPHEN 650 MG RE SUPP: 650.0000 mg | Freq: Four times a day (QID) | RECTAL | Status: DC | PRN

## 2023-05-31 MED ORDER — LORAZEPAM 2 MG/ML IJ SOLN
0.5000 mg | Freq: Two times a day (BID) | INTRAMUSCULAR | Status: DC | PRN
  Administered 2023-05-31 – 2023-06-05 (×6): 0.5 mg via INTRAVENOUS
  Filled 2023-05-31 (×6): qty 1

## 2023-05-31 MED ORDER — HEPARIN BOLUS VIA INFUSION
4500.0000 [IU] | Freq: Once | INTRAVENOUS | Status: AC
  Administered 2023-05-31: 4500 [IU] via INTRAVENOUS

## 2023-05-31 MED ORDER — ENOXAPARIN SODIUM 40 MG/0.4ML IJ SOSY: 40.0000 mg | PREFILLED_SYRINGE | Freq: Every day | INTRAMUSCULAR | Status: DC

## 2023-05-31 MED ORDER — SODIUM CHLORIDE 0.9 % IV SOLN
1.0000 g | Freq: Once | INTRAVENOUS | Status: AC
  Administered 2023-05-31: 1 g via INTRAVENOUS
  Filled 2023-05-31: qty 10

## 2023-05-31 MED ORDER — SODIUM CHLORIDE 0.9 % IV SOLN
500.0000 mg | INTRAVENOUS | Status: DC
  Administered 2023-05-31: 500 mg via INTRAVENOUS
  Filled 2023-05-31: qty 5

## 2023-05-31 MED ORDER — UMECLIDINIUM BROMIDE 62.5 MCG/ACT IN AEPB
1.0000 | INHALATION_SPRAY | Freq: Every day | RESPIRATORY_TRACT | Status: DC
  Filled 2023-05-31: qty 7

## 2023-05-31 MED ORDER — FERROUS SULFATE 325 (65 FE) MG PO TABS
325.0000 mg | ORAL_TABLET | Freq: Every day | ORAL | Status: DC
  Administered 2023-06-01 – 2023-06-04 (×3): 325 mg via ORAL
  Filled 2023-05-31 (×3): qty 1

## 2023-05-31 MED ORDER — IOHEXOL 350 MG/ML SOLN
75.0000 mL | Freq: Once | INTRAVENOUS | Status: AC | PRN
  Administered 2023-05-31: 75 mL via INTRAVENOUS

## 2023-05-31 MED ORDER — SODIUM CHLORIDE 0.9 % IV SOLN: 500.0000 mg | Freq: Once | INTRAVENOUS | Status: DC

## 2023-05-31 MED ORDER — INSULIN ASPART 100 UNIT/ML IJ SOLN
0.0000 [IU] | Freq: Three times a day (TID) | INTRAMUSCULAR | Status: DC
  Administered 2023-06-01: 1 [IU] via SUBCUTANEOUS
  Administered 2023-06-01: 2 [IU] via SUBCUTANEOUS
  Administered 2023-06-01 – 2023-06-02 (×2): 1 [IU] via SUBCUTANEOUS
  Administered 2023-06-02 (×2): 2 [IU] via SUBCUTANEOUS
  Administered 2023-06-03: 4 [IU] via SUBCUTANEOUS
  Administered 2023-06-03: 1 [IU] via SUBCUTANEOUS
  Administered 2023-06-03 – 2023-06-04 (×2): 2 [IU] via SUBCUTANEOUS
  Administered 2023-06-04 (×2): 1 [IU] via SUBCUTANEOUS
  Administered 2023-06-05: 3 [IU] via SUBCUTANEOUS

## 2023-05-31 MED ORDER — ALBUTEROL SULFATE (2.5 MG/3ML) 0.083% IN NEBU
10.0000 mg | INHALATION_SOLUTION | Freq: Once | RESPIRATORY_TRACT | Status: AC
  Administered 2023-05-31: 10 mg via RESPIRATORY_TRACT
  Filled 2023-05-31: qty 12

## 2023-05-31 MED ORDER — SODIUM CHLORIDE 0.9% FLUSH: 3.0000 mL | INTRAVENOUS | Status: DC | PRN

## 2023-05-31 MED ORDER — TRAZODONE HCL 50 MG PO TABS
50.0000 mg | ORAL_TABLET | Freq: Every evening | ORAL | Status: DC | PRN
  Administered 2023-05-31 – 2023-06-05 (×6): 50 mg via ORAL
  Filled 2023-05-31 (×6): qty 1

## 2023-05-31 MED ORDER — TRAMADOL HCL 50 MG PO TABS
50.0000 mg | ORAL_TABLET | Freq: Four times a day (QID) | ORAL | Status: DC | PRN
  Administered 2023-06-01: 50 mg via ORAL
  Filled 2023-05-31: qty 1

## 2023-05-31 MED ORDER — ONDANSETRON HCL 4 MG PO TABS: 4.0000 mg | ORAL_TABLET | Freq: Four times a day (QID) | ORAL | Status: DC | PRN

## 2023-05-31 MED ORDER — SODIUM CHLORIDE 0.9% FLUSH
3.0000 mL | Freq: Two times a day (BID) | INTRAVENOUS | Status: DC
  Administered 2023-05-31 – 2023-06-06 (×12): 3 mL via INTRAVENOUS

## 2023-05-31 MED ORDER — IPRATROPIUM-ALBUTEROL 0.5-2.5 (3) MG/3ML IN SOLN
3.0000 mL | Freq: Four times a day (QID) | RESPIRATORY_TRACT | Status: DC
  Administered 2023-05-31 – 2023-06-01 (×3): 3 mL via RESPIRATORY_TRACT
  Filled 2023-05-31 (×4): qty 3

## 2023-05-31 MED ORDER — SODIUM CHLORIDE 0.9% FLUSH
3.0000 mL | Freq: Two times a day (BID) | INTRAVENOUS | Status: DC
  Administered 2023-05-31: 3 mL via INTRAVENOUS

## 2023-05-31 MED ORDER — DM-GUAIFENESIN ER 30-600 MG PO TB12
1.0000 | ORAL_TABLET | Freq: Two times a day (BID) | ORAL | Status: DC
  Administered 2023-05-31 – 2023-06-05 (×10): 1 via ORAL
  Filled 2023-05-31 (×11): qty 1

## 2023-05-31 MED ORDER — SODIUM CHLORIDE 0.9 % IV SOLN
500.0000 mg | INTRAVENOUS | Status: AC
  Administered 2023-06-01 – 2023-06-03 (×3): 500 mg via INTRAVENOUS
  Filled 2023-05-31 (×3): qty 5

## 2023-05-31 MED ORDER — METHYLPREDNISOLONE SODIUM SUCC 125 MG IJ SOLR
125.0000 mg | Freq: Once | INTRAMUSCULAR | Status: AC
  Administered 2023-05-31: 125 mg via INTRAVENOUS
  Filled 2023-05-31: qty 2

## 2023-05-31 NOTE — Progress Notes (Signed)
ANTICOAGULATION CONSULT NOTE - Initial Consult  Pharmacy Consult for Heparin Indication: pulmonary embolus  No Known Allergies  Patient Measurements: Height: 5\' 9"  (175.3 cm) Weight: 77.1 kg (170 lb) IBW/kg (Calculated) : 70.7 Heparin Dosing Weight: 77 kg  Vital Signs: Temp: 97.4 F (36.3 C) (06/16 0922) BP: 104/66 (06/16 1215) Pulse Rate: 120 (06/16 1215)  Labs: Recent Labs    05/31/23 1016 05/31/23 1123  HGB 12.7*  --   HCT 40.9  --   PLT 186  --   CREATININE 1.16  --   TROPONINIHS 33* 45*    Estimated Creatinine Clearance: 55 mL/min (by C-G formula based on SCr of 1.16 mg/dL).  Assessment: 76 yoM hx lung cancer, prior PE currently not on anticoagulation. 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  Goal of Therapy:  Heparin level 0.3-0.7 units/ml Monitor platelets by anticoagulation protocol: Yes   Plan:  Give 4500 units bolus x 1 Start heparin infusion at 1250 units/hr Check anti-Xa level in 8 hours and daily while on heparin Continue to monitor H&H and platelets  Caryl Asp, PharmD Clinical Pharmacist 05/31/2023 1:18 PM

## 2023-05-31 NOTE — Progress Notes (Signed)
   PCCM transfer request    Sending physician:  Dr. Shon Hale  Sending facility:  [facility]  Reason for transfer:  Tumor in pulmonary artery  Brief case summary:  77 yo male with COPD, chronic respiratory failure and lung cancer followed by Dr. Vassie Loll.  He was in hospital in December 2023 with dyspnea and and hypoxia CT angiogram chest showed radiation changes at the right apex with soft tissue density around the right mainstem bronchus and invading into carina through tracheal wall.  He had bronchoscopy in December 2023 but Rt main bronchus was narrowed immediately after takeoff, the narrowing could not be traversed.  CT angio chest today shows Rt paratracheal and hilar mass extending into the superior and anterior aspect of Rt PA progressive tumor thrombus invading the Rt main PA.  Recommendations made prior to transfer: Stop heparin infusion since he would be at high risk of exsanguinating if tumor erosion into pulmonary artery causes bleeding.  Transfer accepted:  Transfer to [facility] service with PCCM consult.  Will need consultation with oncology and radiation oncology at [facility] also.  Daryl Vega 05/31/23 1:58 PM [name]  For contact information, see Amion. If no response to pager, please call PCCM consult pager. After hours, 7PM- 7AM, please call Elink.

## 2023-05-31 NOTE — H&P (Addendum)
Patient Demographics:    Amier Diedrich, is a 77 y.o. 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 causing acute on chronic Hypoxic Resp Failure- Extensive architectural changes in the right lung following previous radiation -PTA patient was on over 5 L of oxygen via nasal cannula at home -Currently requiring BiPAP alternating with high flow oxygen -Initially in the ED O2 sats was 84 to 86% on 5 L of nasal cannula --Despite respiratory interventions in the ED patient continued to have respiratory distress and was placed on BiPAP --PCCM-- Dr Craige Cotta consulted------ recommendations noted -Transfer to [facility] as patient will need radiation oncology input - 2) acute COPD exacerbation--- in the setting of #1 above -Azithromycin/Rocephin, bronchodilators and mucolytics as well as IV Solu-Medrol as ordered -PCCM consult requested   3)acute on chronic hypoxic respiratory failure--due to #1 and #2 above ---Management as above #1  4)Social/Ethics--- plan of care and advanced directive discussed with patient and son Meiko Marohn [facility]  -Patient request DNR/DNI status, -Otherwise no limitations to treatment -Palliative care consult requested to help further delineate goals of care  5)DM2-recent A1c 5.9 -Hold glipizide and metformin Use Novolog/Humalog Sliding scale insulin with Accu-Cheks/Fingersticks as ordered   6) chronic anemia---multi- factorial in the setting of underlying malignancy/chronic illness as well as iron deficiency - -Hgb currently 12.7 which is at  baseline -stable, continue iron supplementation  7) history of PE (September 2016 )--- patient with underlying malignancy -High risk for DVT and PE---CTA chest on 05/31/2023 without acute PE at this time -Please see PCM transfer request note from Dr. Craige Cotta recommending against full anticoagulation at this time -May use Lovenox prophylactically at this time   Status is: Inpatient  Remains inpatient appropriate because:   Dispo: The patient is from: Home              Anticipated d/c is to:  home with Hospice ????              Anticipated d/c date is: > 3 days              Patient currently is not medically stable to d/c. Barriers: Not Clinically Stable-   With History of - Reviewed by me  Past Medical History:  Diagnosis Date   Anemia    C. difficile enteritis    Cholecystoduodenal fistula    COPD (chronic obstructive pulmonary disease) (HCC)    DM (diabetes mellitus) (HCC)    Duodenal ulcer    Elevated LFTs    FH: chemotherapy    Gastroparesis 02/09/2015   Hypercholesteremia    Lung cancer (HCC)    Melanoma (HCC)    Pneumonia    Protein calorie malnutrition (HCC)  Pulmonary embolus (HCC)    Radiation 08/03/14-08/23/14   35 gray to right chest      Past Surgical History:  Procedure Laterality Date   CHOLECYSTECTOMY     duodenostomy tube     ESOPHAGOGASTRODUODENOSCOPY N/A 11/30/2014   Procedure: ESOPHAGOGASTRODUODENOSCOPY (EGD);  Surgeon: Rachael Fee, MD;  Location: [facility];  Service: Endoscopy;  Laterality: N/A;   FLEXIBLE BRONCHOSCOPY Bilateral 11/25/2022   Procedure: FLEXIBLE BRONCHOSCOPY;  Surgeon: Oretha Milch, MD;  Location: [facility];  Service: Pulmonary;  Laterality: Bilateral;   JEJUNOSTOMY FEEDING TUBE     LAPAROTOMY N/A 11/30/2014   Procedure: EXPLORATORY LAPAROTOMY, PYLOROPLASTY, OVERSEWING OF POSTERIOR DUODENUM, DUODENOSTOMY, CHOLECYSTECTOMY, JEJEUNOSTOMY;  Surgeon: Avel Peace, MD;  Location: [facility];  Service: General;  Laterality: N/A;    PYLOROPLASTY     VIDEO BRONCHOSCOPY Bilateral 07/20/2014   Procedure: VIDEO BRONCHOSCOPY WITHOUT FLUORO;  Surgeon: Nyoka Cowden, MD;  Location: [facility];  Service: Cardiopulmonary;  Laterality: Bilateral;   Chief Complaint  Patient presents with   Shortness of Breath      HPI:    Demontre Kimber  is a 77 y.o. male reformed smoker with past medical history relevant for history of prior PE, recurrent lung cancer, melanoma, DM2, HLD, chronic hypoxic respiratory failure on at least 5 L of oxygen via nasal cannula at home presents by EMS on 05/31/2023 with worsening respiratory failure and increased oxygen requirement -CT angiogram chest in December 2023 showed radiation changes at the right apex with soft tissue density around the right mainstem bronchus and invading into carina through tracheal wall.  He had bronchoscopy in December 2023 but Rt main bronchus was narrowed immediately after takeoff, the narrowing could not be traversed.  Repeat CT angio chest on 05/31/2023 shows Rt paratracheal and hilar mass extending into the superior and anterior aspect of Rt PA progressive tumor thrombus invading the Rt main PA.  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 causing acute on chronic Hypoxic Resp Failure- Extensive architectural changes in the right lung following previous radiation. - Moderate loculated right pleural effusion. -PCCM-- Dr Craige Cotta consulted------ -Patient will be transferred to [facility] for radiation Oncology input -Additional history obtained from patient's son Budd Lucke [place] at bedside -Initially in the ED O2 sats was 84 to 86% on 5 L of nasal cannula -Despite respiratory interventions in the ED patient continued to have respiratory distress and was placed on BiPAP No fever  Or chills  -EKG sinus tachycardia without acute findings -WBC 11.7, Hgb  12.7, platelets 186 -Lactic acid is 2.6 -BNP 1,235, troponin 33 >> 45 =-Creatinine is 1.16, sodium is 138, potassium 4.3 -COVID-negative   Review of systems:    In addition to the HPI above,   A full Review of  Systems was done, all other systems reviewed are negative except as noted above in HPI , .    Social History:  Reviewed by me    Social History   Tobacco Use   Smoking status: Former    Packs/day: 2.00    Years: 50.00    Additional pack years: 0.00    Total pack years: 100.00    Types: Cigarettes, Cigars    Quit date: 06/12/2014    Years since quitting: 8.9   Smokeless tobacco: Former    Types: Chew    Quit date: 09/07/2012  Substance Use Topics   Alcohol use: No  Family History :  Reviewed by me    Family History  Problem Relation Age of Onset   Heart disease Mother    Cancer Mother        ? type    Home Medications:   Prior to Admission medications   Medication Sig Start Date End Date Taking? Authorizing Provider  FEROSUL 325 (65 Fe) MG tablet Take 325 mg by mouth daily. 02/16/23  Yes [provider]  metFORMIN (GLUCOPHAGE) 500 MG tablet Take 1,000 mg by mouth 2 (two) times daily. 02/25/23  Yes [provider]  acetaminophen (TYLENOL) 325 MG tablet Take 2 tablets (650 mg total) by mouth every 6 (six) hours as needed for mild pain (or Fever >/= 101). 10/01/15   Alison Murray, MD  albuterol (PROVENTIL) (2.5 MG/3ML) 0.083% nebulizer solution Take 3 mLs (2.5 mg total) by nebulization every 4 (four) hours as needed for wheezing or shortness of breath. 12/03/22 12/03/23  Oretha Milch, MD  albuterol (VENTOLIN HFA) 108 (90 Base) MCG/ACT inhaler Inhale 2 puffs into the lungs every 4 (four) hours as needed. Patient taking differently: Inhale 2 puffs into the lungs every 4 (four) hours as needed for wheezing or shortness of breath. 11/28/22   Shon Hale, MD  amoxicillin-clavulanate (AUGMENTIN) 875-125 MG tablet Take 1 tablet by mouth 2  (two) times daily. 02/20/23   Oretha Milch, MD  cyanocobalamin (VITAMIN B12) 1000 MCG tablet Take 1 tablet (1,000 mcg total) by mouth daily. 02/16/23   Tyrone Nine, MD  ferrous sulfate 324 (65 Fe) MG TBEC Take 1 tablet (325 mg total) by mouth daily in the afternoon. 02/16/23   Tyrone Nine, MD  gabapentin (NEURONTIN) 300 MG capsule Take 900 mg by mouth 3 (three) times daily.  03/30/20   [provider]  glipiZIDE (GLUCOTROL XL) 10 MG 24 hr tablet Take 1 tablet (10 mg total) by mouth daily with breakfast. 11/28/22   Shon Hale, MD  metFORMIN (GLUCOPHAGE) 1000 MG tablet Take 1 tablet (1,000 mg total) by mouth 2 (two) times daily with a meal. 11/28/22   Aaleah Hirsch, MD  mirtazapine (REMERON) 7.5 MG tablet Take 7.5 mg by mouth at bedtime. 11/18/22   [provider]  rosuvastatin (CRESTOR) 20 MG tablet Take 20 mg by mouth at bedtime. 04/04/20   [provider]  traMADol (ULTRAM) 50 MG tablet Take 50 mg by mouth every 6 (six) hours as needed for moderate pain or severe pain.  04/21/20   [provider]  TRELEGY ELLIPTA 100-62.5-25 MCG/ACT AEPB Inhale 1 puff into the lungs daily. 03/09/23   Oretha Milch, MD     Allergies:    No Known Allergies   Physical Exam:   Vitals  Blood pressure 129/88, pulse (!) 120, temperature (!) 97.4 F (36.3 C), resp. rate (!) 22, height 5\' 9"  (1.753 m), weight 77.1 kg, SpO2 (!) 85 %.  Physical Examination: General appearance -frail  and chronically ill-appearing, respiratory distress, conversational dyspnea Mental status - alert, oriented to person, place, and time,  Eyes - sclera anicteric Nose- HFNC alternating with BiPAP mask Neck - supple, no JVD elevation , Chest -diminished breath sounds, few scattered wheezes  heart - S1 and S2 normal, regular , tachy Abdomen - soft, nontender, nondistended, +BS Neurological - screening mental status exam normal, neck supple without rigidity, cranial nerves II through XII intact,  DTR's normal and symmetric Extremities - no pedal edema noted, intact peripheral pulses  Skin - warm, dry  Data Review:    CBC Recent Labs  Lab 05/31/23 1016  WBC 11.7*  HGB 12.7*  HCT 40.9  PLT 186  MCV 91.7  MCH 28.5  MCHC 31.1  RDW 19.9*  LYMPHSABS 1.8  MONOABS 0.8  EOSABS 0.0  BASOSABS 0.1   ------------------------------------------------------------------------------------------------------------------  Chemistries  Recent Labs  Lab 05/31/23 1016  NA 138  K 4.3  CL 106  CO2 22  GLUCOSE 90  BUN 34*  CREATININE 1.16  CALCIUM 9.5   ------------------------------------------------------------------------------------------------------------------ estimated creatinine clearance is 55 mL/min (by C-G formula based on SCr of 1.16 mg/dL). ------------------------------------------------------------------------------------------------------------------ ------------------------------------------------------------------------------------------------------------------    Component Value Date/Time   BNP 1,235.0 (H) 05/31/2023 1017   Urinalysis    Component Value Date/Time   COLORURINE AMBER (A) 02/08/2023 0734   APPEARANCEUR CLEAR 02/08/2023 0734   LABSPEC 1.026 02/08/2023 0734   PHURINE 5.0 02/08/2023 0734   GLUCOSEU NEGATIVE 02/08/2023 0734   HGBUR NEGATIVE 02/08/2023 0734   BILIRUBINUR NEGATIVE 02/08/2023 0734   KETONESUR 5 (A) 02/08/2023 0734   PROTEINUR >=300 (A) 02/08/2023 0734   UROBILINOGEN 1.0 09/24/2015 1907   NITRITE NEGATIVE 02/08/2023 0734   LEUKOCYTESUR NEGATIVE 02/08/2023 0734     Imaging Results:    CT Angio Chest PE W and/or Wo Contrast  Result Date: 05/31/2023 CLINICAL DATA:  Pulmonary embolism suspected, high probability. Increasing shortness of breath beginning yesterday. Hypoxia. EXAM: CT ANGIOGRAPHY CHEST WITH CONTRAST TECHNIQUE: Multidetector CT imaging of the chest was performed using the standard protocol during bolus  administration of intravenous contrast. Multiplanar CT image reconstructions and MIPs were obtained to evaluate the vascular anatomy. RADIATION DOSE REDUCTION: This exam was performed according to the departmental dose-optimization program which includes automated exposure control, adjustment of the mA and/or kV according to patient size and/or use of iterative reconstruction technique. CONTRAST:  75mL OMNIPAQUE IOHEXOL 350 MG/ML SOLN COMPARISON:  CT angio chest 11/24/2022 and 02/12/2023. FINDINGS: Cardiovascular: Heart size is normal. Atherosclerotic calcifications are present the aortic arch and great vessel origins without significant aneurysm, stenosis or change. 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 Tumor is nonocclusive. No distal pulmonary embolus is present. Pulmonary branch vessels are within normal limits. Mediastinum/Nodes: Right paratracheal and hilar mass extends into the superior and anterior aspect of the right main pulmonary artery. The lesion measures at least 23 x 16 x 21 mm. Lungs/Pleura: Extensive architectural changes present in the right lung following previous radiation. Parenchymal changes are stable. Moderate loculated right pleural effusion is present. A left pleural effusion has increased since the prior exam. Areas of dependent atelectasis are present in the left lung. Upper Abdomen: Cholecystectomy noted. Visualized liver is unremarkable. The visualized pancreas and spleen are within normal limits. A water density lesion in the posterior left kidney is stable measuring 20 mm. Other solid organ lesions are present. Cholecystectomy noted. Musculoskeletal: The vertebral body heights and alignment normal. Review of the MIP images confirms the above findings. IMPRESSION: 1. 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 2. Extensive architectural changes in the right lung following previous radiation. 3. Moderate loculated right  pleural effusion. 4. Increased size of left pleural effusion with associated dependent atelectasis. 5. Stable 20 mm water density lesion in the posterior left kidney, likely a cyst. Recommend no imaging follow-up. 6.  Aortic Atherosclerosis (ICD10-I70.0). These results were called by telephone at the time of interpretation on 05/31/2023 at 12:23 pm to provider [facility] , who verbally acknowledged these results. Electronically Signed   By: Cristal Deer  Mattern M.D.   On: 05/31/2023 12:25   DG Chest Port 1 View  Result Date: 05/31/2023 CLINICAL DATA:  Shortness of breath EXAM: PORTABLE CHEST 1 VIEW COMPARISON:  03/18/2023 FINDINGS: Similar appearance of the right lung where there have been extensive post treatment changes with scarring and volume loss. Loculated right pleural effusion. Interval development of extensive interstitial opacity throughout the left lung. No left-sided pleural effusion. Skin fold overlies the periphery of the left hemithorax without evidence of a pneumothorax. IMPRESSION: 1. Interval development of extensive interstitial opacity throughout the left lung, which may represent edema or atypical infection. 2. Similar appearance of the right lung where there have been extensive post treatment changes with scarring and volume loss. Electronically Signed   By: Duanne Guess D.O.   On: 05/31/2023 10:13    Radiological Exams on Admission: CT Angio Chest PE W and/or Wo Contrast  Result Date: 05/31/2023 CLINICAL DATA:  Pulmonary embolism suspected, high probability. Increasing shortness of breath beginning yesterday. Hypoxia. EXAM: CT ANGIOGRAPHY CHEST WITH CONTRAST TECHNIQUE: Multidetector CT imaging of the chest was performed using the standard protocol during bolus administration of intravenous contrast. Multiplanar CT image reconstructions and MIPs were obtained to evaluate the vascular anatomy. RADIATION DOSE REDUCTION: This exam was performed according to the departmental  dose-optimization program which includes automated exposure control, adjustment of the mA and/or kV according to patient size and/or use of iterative reconstruction technique. CONTRAST:  75mL OMNIPAQUE IOHEXOL 350 MG/ML SOLN COMPARISON:  CT angio chest 11/24/2022 and 02/12/2023. FINDINGS: Cardiovascular: Heart size is normal. Atherosclerotic calcifications are present the aortic arch and great vessel origins without significant aneurysm, stenosis or change. 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 Tumor is nonocclusive. No distal pulmonary embolus is present. Pulmonary branch vessels are within normal limits. Mediastinum/Nodes: Right paratracheal and hilar mass extends into the superior and anterior aspect of the right main pulmonary artery. The lesion measures at least 23 x 16 x 21 mm. Lungs/Pleura: Extensive architectural changes present in the right lung following previous radiation. Parenchymal changes are stable. Moderate loculated right pleural effusion is present. A left pleural effusion has increased since the prior exam. Areas of dependent atelectasis are present in the left lung. Upper Abdomen: Cholecystectomy noted. Visualized liver is unremarkable. The visualized pancreas and spleen are within normal limits. A water density lesion in the posterior left kidney is stable measuring 20 mm. Other solid organ lesions are present. Cholecystectomy noted. Musculoskeletal: The vertebral body heights and alignment normal. Review of the MIP images confirms the above findings. IMPRESSION: 1. 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 2. Extensive architectural changes in the right lung following previous radiation. 3. Moderate loculated right pleural effusion. 4. Increased size of left pleural effusion with associated dependent atelectasis. 5. Stable 20 mm water density lesion in the posterior left kidney, likely a cyst. Recommend no imaging follow-up.  6.  Aortic Atherosclerosis (ICD10-I70.0). These results were called by telephone at the time of interpretation on 05/31/2023 at 12:23 pm to provider [facility] , who verbally acknowledged these results. Electronically Signed   By: Marin Roberts M.D.   On: 05/31/2023 12:25   DG Chest Port 1 View  Result Date: 05/31/2023 CLINICAL DATA:  Shortness of breath EXAM: PORTABLE CHEST 1 VIEW COMPARISON:  03/18/2023 FINDINGS: Similar appearance of the right lung where there have been extensive post treatment changes with scarring and volume loss. Loculated right pleural effusion. Interval development of extensive interstitial opacity throughout the left  lung. No left-sided pleural effusion. Skin fold overlies the periphery of the left hemithorax without evidence of a pneumothorax. IMPRESSION: 1. Interval development of extensive interstitial opacity throughout the left lung, which may represent edema or atypical infection. 2. Similar appearance of the right lung where there have been extensive post treatment changes with scarring and volume loss. Electronically Signed   By: Duanne Guess D.O.   On: 05/31/2023 10:13    DVT Prophylaxis -SCD/Lovenox AM Labs Ordered, also please review Full Orders  Family Communication: Admission, patients condition and plan of care including tests being ordered have been discussed with the patient and son Emigdio Duty Reiley who indicate understanding and agree with the plan   Condition -guarded  Shon Hale M.D on 05/31/2023 at 2:28 PM Go to [email or web address] -  for contact info  [facility] - Office  [phone]

## 2023-05-31 NOTE — ED Provider Notes (Signed)
EMERGENCY DEPARTMENT AT [facility] Provider Note   CSN: [id number] Arrival date & time: 05/31/23  4098     History  Chief Complaint  Patient presents with   Shortness of Breath    Daryl Vega is a 77 y.o. male.  Pt is a 77 yo male with pmhx significant for dm, hld, melanoma, lung cancer, PE, COPD on 5L oxygen.  Pt has been sob since last night.  His phone died and he could not use it to call EMS.  He did not sleep well due to sob.  This am, he was able to get outside and get to the street.  He flagged down a neighbor who called 911.  Pt was given a duoneb by EMS which did help a little.  He is 86% on his normal 5L oxygen.  No f/c.       Home Medications Prior to Admission medications   Medication Sig Start Date End Date Taking? Authorizing Provider  acetaminophen (TYLENOL) 325 MG tablet Take 2 tablets (650 mg total) by mouth every 6 (six) hours as needed for mild pain (or Fever >/= 101). 10/01/15   Alison Murray, MD  albuterol (PROVENTIL) (2.5 MG/3ML) 0.083% nebulizer solution Take 3 mLs (2.5 mg total) by nebulization every 4 (four) hours as needed for wheezing or shortness of breath. 12/03/22 12/03/23  Oretha Milch, MD  albuterol (VENTOLIN HFA) 108 (90 Base) MCG/ACT inhaler Inhale 2 puffs into the lungs every 4 (four) hours as needed. Patient taking differently: Inhale 2 puffs into the lungs every 4 (four) hours as needed for wheezing or shortness of breath. 11/28/22   Shon Hale, MD  amoxicillin-clavulanate (AUGMENTIN) 875-125 MG tablet Take 1 tablet by mouth 2 (two) times daily. 02/20/23   Oretha Milch, MD  cyanocobalamin (VITAMIN B12) 1000 MCG tablet Take 1 tablet (1,000 mcg total) by mouth daily. 02/16/23   Tyrone Nine, MD  ferrous sulfate 324 (65 Fe) MG TBEC Take 1 tablet (325 mg total) by mouth daily in the afternoon. 02/16/23   Tyrone Nine, MD  gabapentin (NEURONTIN) 300 MG capsule Take 900 mg by mouth 3 (three) times daily.  03/30/20    [provider]  glipiZIDE (GLUCOTROL XL) 10 MG 24 hr tablet Take 1 tablet (10 mg total) by mouth daily with breakfast. 11/28/22   Shon Hale, MD  metFORMIN (GLUCOPHAGE) 1000 MG tablet Take 1 tablet (1,000 mg total) by mouth 2 (two) times daily with a meal. 11/28/22   Emokpae, Courage, MD  mirtazapine (REMERON) 7.5 MG tablet Take 7.5 mg by mouth at bedtime. 11/18/22   [provider]  rosuvastatin (CRESTOR) 20 MG tablet Take 20 mg by mouth at bedtime. 04/04/20   [provider]  traMADol (ULTRAM) 50 MG tablet Take 50 mg by mouth every 6 (six) hours as needed for moderate pain or severe pain.  04/21/20   [provider]  TRELEGY ELLIPTA 100-62.5-25 MCG/ACT AEPB Inhale 1 puff into the lungs daily. 03/09/23   Oretha Milch, MD      Allergies    Patient has no known allergies.    Review of Systems   Review of Systems  Respiratory:  Positive for shortness of breath and wheezing.   All other systems reviewed and are negative.   Physical Exam Updated Vital Signs BP 104/66   Pulse (!) 120   Temp (!) 97.4 F (36.3 C)   Resp 20   Ht 5\' 9"  (1.753  m)   Wt 77.1 kg   SpO2 96%   BMI 25.10 kg/m  Physical Exam Vitals and nursing note reviewed.  Constitutional:      Appearance: He is underweight. He is ill-appearing.  HENT:     Head: Normocephalic and atraumatic.     Mouth/Throat:     Mouth: Mucous membranes are moist.     Pharynx: Oropharynx is clear.  Eyes:     Extraocular Movements: Extraocular movements intact.     Pupils: Pupils are equal, round, and reactive to light.  Cardiovascular:     Rate and Rhythm: Regular rhythm. Tachycardia present.  Pulmonary:     Effort: Tachypnea and accessory muscle usage present.     Breath sounds: Wheezing present.  Abdominal:     General: Bowel sounds are normal.     Palpations: Abdomen is soft.  Musculoskeletal:        General: Normal range of motion.     Cervical back: Normal range of motion and neck  supple.  Skin:    General: Skin is warm.     Capillary Refill: Capillary refill takes less than 2 seconds.  Neurological:     General: No focal deficit present.     Mental Status: He is alert and oriented to person, place, and time.  Psychiatric:        Mood and Affect: Mood normal.        Behavior: Behavior normal.     ED Results / Procedures / Treatments   Labs (all labs ordered are listed, but only abnormal results are displayed) Labs Reviewed  BASIC METABOLIC PANEL - Abnormal; Notable for the following components:      Result Value   BUN 34 (*)    All other components within normal limits  BRAIN NATRIURETIC PEPTIDE - Abnormal; Notable for the following components:   B Natriuretic Peptide 1,235.0 (*)    All other components within normal limits  CBC WITH DIFFERENTIAL/PLATELET - Abnormal; Notable for the following components:   WBC 11.7 (*)    Hemoglobin 12.7 (*)    RDW 19.9 (*)    Neutro Abs 9.0 (*)    All other components within normal limits  LACTIC ACID, PLASMA - Abnormal; Notable for the following components:   Lactic Acid, Venous 2.6 (*)    All other components within normal limits  LACTIC ACID, PLASMA - Abnormal; Notable for the following components:   Lactic Acid, Venous 2.6 (*)    All other components within normal limits  TROPONIN I (HIGH SENSITIVITY) - Abnormal; Notable for the following components:   Troponin I (High Sensitivity) 33 (*)    All other components within normal limits  TROPONIN I (HIGH SENSITIVITY) - Abnormal; Notable for the following components:   Troponin I (High Sensitivity) 45 (*)    All other components within normal limits  CULTURE, BLOOD (ROUTINE X 2)  CULTURE, BLOOD (ROUTINE X 2)  SARS CORONAVIRUS 2 BY RT PCR    EKG None  Radiology CT Angio Chest PE W and/or Wo Contrast  Result Date: 05/31/2023 CLINICAL DATA:  Pulmonary embolism suspected, high probability. Increasing shortness of breath beginning yesterday. Hypoxia. EXAM: CT  ANGIOGRAPHY CHEST WITH CONTRAST TECHNIQUE: Multidetector CT imaging of the chest was performed using the standard protocol during bolus administration of intravenous contrast. Multiplanar CT image reconstructions and MIPs were obtained to evaluate the vascular anatomy. RADIATION DOSE REDUCTION: This exam was performed according to the departmental dose-optimization program which includes automated exposure control, adjustment of  the mA and/or kV according to patient size and/or use of iterative reconstruction technique. CONTRAST:  75mL OMNIPAQUE IOHEXOL 350 MG/ML SOLN COMPARISON:  CT angio chest 11/24/2022 and 02/12/2023. FINDINGS: Cardiovascular: Heart size is normal. Atherosclerotic calcifications are present the aortic arch and great vessel origins without significant aneurysm, stenosis or change. 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 Tumor is nonocclusive. No distal pulmonary embolus is present. Pulmonary branch vessels are within normal limits. Mediastinum/Nodes: Right paratracheal and hilar mass extends into the superior and anterior aspect of the right main pulmonary artery. The lesion measures at least 23 x 16 x 21 mm. Lungs/Pleura: Extensive architectural changes present in the right lung following previous radiation. Parenchymal changes are stable. Moderate loculated right pleural effusion is present. A left pleural effusion has increased since the prior exam. Areas of dependent atelectasis are present in the left lung. Upper Abdomen: Cholecystectomy noted. Visualized liver is unremarkable. The visualized pancreas and spleen are within normal limits. A water density lesion in the posterior left kidney is stable measuring 20 mm. Other solid organ lesions are present. Cholecystectomy noted. Musculoskeletal: The vertebral body heights and alignment normal. Review of the MIP images confirms the above findings. IMPRESSION: 1. 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 2. Extensive architectural changes in the right lung following previous radiation. 3. Moderate loculated right pleural effusion. 4. Increased size of left pleural effusion with associated dependent atelectasis. 5. Stable 20 mm water density lesion in the posterior left kidney, likely a cyst. Recommend no imaging follow-up. 6.  Aortic Atherosclerosis (ICD10-I70.0). These results were called by telephone at the time of interpretation on 05/31/2023 at 12:23 pm to provider [facility] , who verbally acknowledged these results. Electronically Signed   By: Marin Roberts M.D.   On: 05/31/2023 12:25   DG Chest Port 1 View  Result Date: 05/31/2023 CLINICAL DATA:  Shortness of breath EXAM: PORTABLE CHEST 1 VIEW COMPARISON:  03/18/2023 FINDINGS: Similar appearance of the right lung where there have been extensive post treatment changes with scarring and volume loss. Loculated right pleural effusion. Interval development of extensive interstitial opacity throughout the left lung. No left-sided pleural effusion. Skin fold overlies the periphery of the left hemithorax without evidence of a pneumothorax. IMPRESSION: 1. Interval development of extensive interstitial opacity throughout the left lung, which may represent edema or atypical infection. 2. Similar appearance of the right lung where there have been extensive post treatment changes with scarring and volume loss. Electronically Signed   By: Duanne Guess D.O.   On: 05/31/2023 10:13    Procedures Procedures    Medications Ordered in ED Medications  azithromycin (ZITHROMAX) 500 mg in sodium chloride 0.9 % 250 mL IVPB (0 mg Intravenous Stopped 05/31/23 1234)  methylPREDNISolone sodium succinate (SOLU-MEDROL) 125 mg/2 mL injection 125 mg (125 mg Intravenous Given 05/31/23 0952)  albuterol (PROVENTIL) (2.5 MG/3ML) 0.083% nebulizer solution 10 mg (10 mg Nebulization Given 05/31/23 0947)  cefTRIAXone (ROCEPHIN) 1 g in sodium  chloride 0.9 % 100 mL IVPB (0 g Intravenous Stopped 05/31/23 1203)  iohexol (OMNIPAQUE) 350 MG/ML injection 75 mL (75 mLs Intravenous Contrast Given 05/31/23 1145)    ED Course/ Medical Decision Making/ A&P                             Medical Decision Making Amount and/or Complexity of Data Reviewed Labs: ordered. Radiology: ordered.  Risk Prescription drug management. Decision regarding hospitalization.  This patient presents to the ED for concern of sob, this involves an extensive number of treatment options, and is a complaint that carries with it a high risk of complications and morbidity.  The differential diagnosis includes copd exac, pna, covid   Co morbidities that complicate the patient evaluation  dm, hld, melanoma, lung cancer, PE, COPD on 5L oxygen   Additional history obtained:  Additional history obtained from epic chart review External records from outside source obtained and reviewed including EMS report   Lab Tests:  I Ordered, and personally interpreted labs.  The pertinent results include:  cbc with wbc elevated at 11.7, bmp nl, trop sl elevated at 33, lactic elevated at 2.6, bnp elevated at 1235 (647.8 in March), covid neg   Imaging Studies ordered:  I ordered imaging studies including cxr and ct chest  I independently visualized and interpreted imaging which showed  CXR: Interval development of extensive interstitial opacity throughout  the left lung, which may represent edema or atypical infection.  2. Similar appearance of the right lung where there have been  extensive post treatment changes with scarring and volume loss.  CT chest: Progressive nonocclusive tumor thrombus invades the right main  pulmonary artery.  2. Extensive architectural changes in the right lung following  previous radiation.  3. Moderate loculated right pleural effusion.  4. Increased size of left pleural effusion with associated dependent  atelectasis.  5. Stable 20  mm water density lesion in the posterior left kidney,  likely a cyst. Recommend no imaging follow-up.  6.  Aortic Atherosclerosis (ICD10-I70.0).   I agree with the radiologist interpretation   Cardiac Monitoring:  The patient was maintained on a cardiac monitor.  I personally viewed and interpreted the cardiac monitored which showed an underlying rhythm of: st   Medicines ordered and prescription drug management:  I ordered medication including solumedrol/nebs  for sob and rocephin/zithromax for pna  Reevaluation of the patient after these medicines showed that the patient improved I have reviewed the patients home medicines and have made adjustments as needed   Test Considered:  ct   Critical Interventions:  oxygen   Consultations Obtained:  I requested consultation with the hospitalist (Dr. Jayme Cloud),  and discussed lab and imaging findings as well as pertinent plan - he will admit to [facility].   Problem List / ED Course:  COPD exacerbation:  pt given steroids and several nebs.  His O2 sat drops to the 70s on 6L when he moves, so he was put on bipap.  This has helped.  Unfortunately, CT shows tumor thrombus into the right main pulmonary artery.  Pt is not on anticoagulation currently.   Reevaluation:  After the interventions noted above, I reevaluated the patient and found that they have :improved   Social Determinants of Health:  Lives alone   Dispostion:  After consideration of the diagnostic results and the patients response to treatment, I feel that the patent would benefit from admission.    CRITICAL CARE Performed by: Jacalyn Lefevre   Total critical care time: 30 minutes  Critical care time was exclusive of separately billable procedures and treating other patients.  Critical care was necessary to treat or prevent imminent or life-threatening deterioration.  Critical care was time spent personally by me on the following activities: development of  treatment plan with patient and/or surrogate as well as nursing, discussions with consultants, evaluation of patient's response to treatment, examination of patient, obtaining history from patient or surrogate, ordering and  performing treatments and interventions, ordering and review of laboratory studies, ordering and review of radiographic studies, pulse oximetry and re-evaluation of patient's condition.         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

## 2023-05-31 NOTE — Progress Notes (Signed)
   05/31/23 1724  BiPAP/CPAP/SIPAP  $ Non-Invasive Ventilator  Non-Invasive Vent Initial  $ Face Mask Medium Yes  BiPAP/CPAP/SIPAP Pt Type Adult  BiPAP/CPAP/SIPAP V60  Mask Type Full face mask  Mask Size Medium  Set Rate 10 breaths/min  Respiratory Rate 33 breaths/min  IPAP 15 cmH20  EPAP 6 cmH2O  FiO2 (%) 50 %  Minute Ventilation 23.8  Leak 88 (pt keeps pulling on mask)  Peak Inspiratory Pressure (PIP) 18  Tidal Volume (Vt) 663  Patient Home Equipment No  Auto Titrate No  Press High Alarm 25 cmH2O  Press Low Alarm 5 cmH2O  BiPAP/CPAP /SiPAP Vitals  Pulse Rate (!) 120  Resp (!) 33  SpO2 99 %  Bilateral Breath Sounds Diminished  MEWS Score/Color  MEWS Score 4  MEWS Score Color Red

## 2023-05-31 NOTE — Consult Note (Signed)
NAME:  Daryl Vega, MRN:  [id number], DOB:  04/26/1946, LOS: 0 ADMISSION DATE:  05/31/2023, CONSULTATION DATE:  05/31/2023 REFERRING MD:  Dr. Mariea Clonts, CHIEF COMPLAINT:  short of breath   History of Present Illness:  77 yo male with COPD, chronic respiratory failure and lung cancer followed by Dr. Vassie Loll.  He was in hospital in December 2023 with dyspnea and and hypoxia CT angiogram chest showed radiation changes at the right apex with soft tissue density around the right mainstem bronchus and invading into carina through tracheal wall.  He had bronchoscopy in December 2023 but Rt main bronchus was narrowed immediately after takeoff, the narrowing could not be traversed.  He presented to [facility] with worsening dyspnea.  CT angio chest from 05/31/23 shows Rt paratracheal and hilar mass extending into the superior and anterior aspect of Rt PA progressive tumor thrombus invading the Rt main PA.  He was started on therapy for COPD exacerbation with Bipap, and transferred to [facility] for further management.  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  Objective   BP (!) 161/90 (BP Location: Left Arm)   Pulse (!) 118   Temp 98.7 F (37.1 C) (Axillary)   Resp (!) 21   Ht 5\' 9"  (1.753 m)   Wt 68.8 kg   SpO2 96%   BMI 22.40 kg/m   FiO2 (%):  [40 %-50 %] 50 %  No intake or output data in the 24 hours ending 05/31/23 1735 Filed Weights   05/31/23 0923  Weight: 77.1 kg    Examination:  General - alert, intermittently agitated Eyes - pupils reactive ENT - Bipap mask on Cardiac - regular,  tachycardic Chest - decreased breath sounds, poor air movement, no wheezing Abdomen - soft, non tender, + bowel sounds Extremities - no cyanosis, clubbing, or edema Skin - no rashes Neuro - confused, says he is "waiting for the short man to come, and he thinks we are hiding his guns"  Resolved Hospital Problem list     Assessment & Plan:   Acute on chronic hypoxic respiratory failure from COPD exacerbation. - oxygen to keep SpO2 > 90% - Bipap prn - continue BDs - continue solumedrol 40 mg q12h - day 1 of Abx  Tumor thrombus of Rt pulmonary artery. Hx of Stage 3b NSCLC (squamous cell) from August 2015 s/p chemo and XRT with Rt upper lobe radiation fibrosis. - very difficult situation and not clear there are any good options for this - he has too many co-morbidities to be a candidate for surgery - can consult oncology and radiation oncology to see if he can receive empiric therapy  Chronic loculated Rt pleural effusion. - follow up chest xray  Acute systolic CHF. - Echo 05/31/23 >> EF 25%, mod RV enlargement, severe RA dilation, small pericardial effusion - needs further discussion regarding goals of care before proceeding with additional interventions for this  Acute delirium. - reorient as needed - monitor neuro status - he is not able to participate in goals of care discussions at this time  DM type 2. 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 HLD. - per primary  team  Goals of care. - DNR/DNI - given that he has tumor invasion of his pulmonary artery, his prognosis is grim and it is uncertain whether there are even any viable treatment options - palliative care consulted - pt is not able to participate in goals of care discussion at present due to delirium - report that his family member was escorted out of [facility] ER because of being confrontationaly  Best Practice (right click and "Reselect all SmartList Selections" daily)   Diet/type: clear liquids DVT prophylaxis: LMWH GI  prophylaxis: N/A Lines: N/A Foley:  N/A Code Status:  DNR Last date of multidisciplinary goals of care discussion [x]   Labs   CBC: Recent Labs  Lab 05/31/23 1016  WBC 11.7*  NEUTROABS 9.0*  HGB 12.7*  HCT 40.9  MCV 91.7  PLT 186    Basic Metabolic Panel: Recent Labs  Lab 05/31/23 1016  NA 138  K 4.3  CL 106  CO2 22  GLUCOSE 90  BUN 34*  CREATININE 1.16  CALCIUM 9.5   GFR: Estimated Creatinine Clearance: 55 mL/min (by C-G formula based on SCr of 1.16 mg/dL). Recent Labs  Lab 05/31/23 1016 05/31/23 1017 05/31/23 1123  WBC 11.7*  --   --   LATICACIDVEN  --  2.6* 2.6*    Liver Function Tests: No results for input(s): "AST", "ALT", "ALKPHOS", "BILITOT", "PROT", "ALBUMIN" in the last 168 hours. No results for input(s): "LIPASE", "AMYLASE" in the last 168 hours. No results for input(s): "AMMONIA" in the last 168 hours.  ABG    Component Value Date/Time   PHART 7.341 (L) 12/01/2014 0910   PCO2ART 27.6 (L) 12/01/2014 0910   PO2ART 128.0 (H) 12/01/2014 0910   HCO3 31.6 (H) 02/12/2023 1350   TCO2 29 05/02/2020 1857   ACIDBASEDEF 9.7 (H) 12/01/2014 0910   O2SAT 37.9 02/12/2023 1350     Coagulation Profile: No results for input(s): "INR", "PROTIME" in the last 168 hours.  Cardiac Enzymes: No results for input(s): "CKTOTAL", "CKMB", "CKMBINDEX", "TROPONINI" in the last 168 hours.  HbA1C: Hgb A1c MFr Bld  Date/Time Value Ref Range Status  02/08/2023 09:42 AM 7.1 (H) 4.8 - 5.6 % Final    Comment:    (NOTE)         Prediabetes: 5.7 - 6.4         Diabetes: >6.4         Glycemic control for adults with diabetes: <7.0   05/03/2020 09:12 AM 8.3 (H) 4.8 - 5.6 % Final    Comment:    (NOTE) Pre diabetes:          5.7%-6.4% Diabetes:              >6.4% Glycemic control for   <7.0% adults with diabetes     CBG: No results for input(s): "GLUCAP" in the last 168 hours.  Review of Systems:   Unable to obtain.  Past Medical History:  He,  has a past  medical history of Anemia, C. difficile enteritis, Cholecystoduodenal fistula, COPD (chronic obstructive pulmonary disease) (HCC), DM (diabetes mellitus) (HCC), Duodenal ulcer, Elevated LFTs, FH: chemotherapy, Gastroparesis (02/09/2015), Hypercholesteremia, Lung cancer (HCC), Melanoma (HCC), Pneumonia, Protein calorie malnutrition (HCC), Pulmonary embolus (HCC), and Radiation (08/03/14-08/23/14).   Surgical History:   Past Surgical History:  Procedure Laterality Date   CHOLECYSTECTOMY     duodenostomy tube     ESOPHAGOGASTRODUODENOSCOPY N/A 11/30/2014   Procedure: ESOPHAGOGASTRODUODENOSCOPY (EGD);  Surgeon: Rachael Fee, MD;  Location: [facility];  Service: Endoscopy;  Laterality: N/A;   FLEXIBLE BRONCHOSCOPY Bilateral 11/25/2022   Procedure: FLEXIBLE BRONCHOSCOPY;  Surgeon: Oretha Milch, MD;  Location: [facility];  Service: Pulmonary;  Laterality: Bilateral;   JEJUNOSTOMY FEEDING TUBE     LAPAROTOMY N/A 11/30/2014   Procedure: EXPLORATORY LAPAROTOMY, PYLOROPLASTY, OVERSEWING OF POSTERIOR DUODENUM, DUODENOSTOMY, CHOLECYSTECTOMY, JEJEUNOSTOMY;  Surgeon: Avel Peace, MD;  Location: [facility];  Service: General;  Laterality: N/A;   PYLOROPLASTY     VIDEO BRONCHOSCOPY Bilateral 07/20/2014   Procedure: VIDEO BRONCHOSCOPY WITHOUT FLUORO;  Surgeon: Nyoka Cowden, MD;  Location: [facility];  Service: Cardiopulmonary;  Laterality: Bilateral;     Social History:   reports that he quit smoking about 8 years ago. His smoking use included cigarettes and cigars. He has a 100.00 pack-year smoking history. He quit smokeless tobacco use about 10 years ago.  His smokeless tobacco use included chew. He reports that he does not drink alcohol and does not use drugs.   Family History:  His family history includes Cancer in his mother; Heart disease in his mother.   Allergies No Known Allergies   Home Medications  Prior to Admission medications   Medication Sig Start Date End Date Taking? Authorizing  Provider  acetaminophen (TYLENOL) 325 MG tablet Take 2 tablets (650 mg total) by mouth every 6 (six) hours as needed for mild pain (or Fever >/= 101). 10/01/15  Yes Alison Murray, MD  albuterol (PROVENTIL) (2.5 MG/3ML) 0.083% nebulizer solution Take 3 mLs (2.5 mg total) by nebulization every 4 (four) hours as needed for wheezing or shortness of breath. 12/03/22 12/03/23 Yes Oretha Milch, MD  albuterol (VENTOLIN HFA) 108 (90 Base) MCG/ACT inhaler Inhale 2 puffs into the lungs every 4 (four) hours as needed. Patient taking differently: Inhale 2 puffs into the lungs every 4 (four) hours as needed for wheezing or shortness of breath. 11/28/22  Yes Emokpae, Courage, MD  ferrous sulfate 324 (65 Fe) MG TBEC Take 1 tablet (325 mg total) by mouth daily in the afternoon. 02/16/23  Yes Tyrone Nine, MD  Fluticasone Furoate-Vilanterol (BREO ELLIPTA IN) Inhale 2 puffs into the lungs 2 (two) times daily. Pt gets samples from his Doctor.   Yes [provider]  gabapentin (NEURONTIN) 300 MG capsule Take 900 mg by mouth 3 (three) times daily.  03/30/20  Yes [provider]  glipiZIDE (GLUCOTROL XL) 10 MG 24 hr tablet Take 1 tablet (10 mg total) by mouth daily with breakfast. 11/28/22  Yes Emokpae, Courage, MD  metFORMIN (GLUCOPHAGE) 500 MG tablet Take 500 mg by mouth 3 (three) times daily. 02/25/23  Yes [provider]  mirtazapine (REMERON) 7.5 MG tablet Take 7.5 mg by mouth at bedtime. 11/18/22  Yes [provider]  rosuvastatin (CRESTOR) 20 MG tablet Take 20 mg by mouth at bedtime. 04/04/20  Yes [provider]  traMADol (ULTRAM) 50 MG tablet Take 50 mg by mouth every 6 (six) hours as needed for moderate pain or severe pain.  04/21/20  Yes [provider]  amoxicillin-clavulanate (AUGMENTIN) 875-125 MG tablet Take 1 tablet by mouth 2 (two) times daily. Patient not taking: Reported on 05/31/2023 02/20/23   Oretha Milch, MD  metFORMIN (GLUCOPHAGE) 1000 MG tablet Take 1  tablet (1,000 mg total) by mouth 2 (two) times daily with a meal. Patient not taking: Reported on 05/31/2023 11/28/22   Shon Hale, MD  TRELEGY ELLIPTA 100-62.5-25 MCG/ACT AEPB Inhale 1 puff into the lungs daily. Patient not taking: Reported on 05/31/2023 03/09/23  Oretha Milch, MD     Signature:  Coralyn Helling, MD [facility] Pulmonary/Critical Care Pager - [phone] or [phone] 05/31/2023, 6:19 PM

## 2023-05-31 NOTE — Progress Notes (Signed)
   05/31/23 2042  BiPAP/CPAP/SIPAP  Reason BIPAP/CPAP not in use Non-compliant (Pt said he doest wear at home and he is doing well. Pt is no Resp distress at this time. Machine remained bedside.)  BiPAP/CPAP /SiPAP Vitals  SpO2 93 %  Bilateral Breath Sounds Diminished

## 2023-05-31 NOTE — ED Triage Notes (Signed)
Pt to ER via EMS from home with c/o increasing SHOB starting yesterday getting worse this AM.  Pt on chronic O2 at 5L, hx COPD and frequent pneumonia.  Pt given duoneb in route with some improvement.

## 2023-05-31 NOTE — Progress Notes (Signed)
*  PRELIMINARY RESULTS* Echocardiogram Limited 2-D echocardiogram  has been performed. Extremely limited windows.  Stacey Drain 05/31/2023, 2:03 PM

## 2023-06-01 ENCOUNTER — Ambulatory Visit
Admit: 2023-06-01 | Discharge: 2023-06-01 | Disposition: A | Discharge status: Home / Self Care | Payer: Medicare Other | Attending: Radiation Oncology | Admitting: Radiation Oncology

## 2023-06-01 DIAGNOSIS — Z515 Encounter for palliative care: Secondary | ICD-10-CM

## 2023-06-01 DIAGNOSIS — C3491 Malignant neoplasm of unspecified part of right bronchus or lung: Secondary | ICD-10-CM

## 2023-06-01 DIAGNOSIS — Z7189 Other specified counseling: Secondary | ICD-10-CM

## 2023-06-01 DIAGNOSIS — J9621 Acute and chronic respiratory failure with hypoxia: Secondary | ICD-10-CM | POA: Diagnosis not present

## 2023-06-01 DIAGNOSIS — R531 Weakness: Secondary | ICD-10-CM

## 2023-06-01 LAB — BASIC METABOLIC PANEL
Anion gap: 10 (ref 5–15)
BUN: 34 mg/dL — ABNORMAL HIGH (ref 8–23)
CO2: 24 mmol/L (ref 22–32)
Calcium: 9 mg/dL (ref 8.9–10.3)
Chloride: 105 mmol/L (ref 98–111)
Creatinine, Ser: 1.21 mg/dL (ref 0.61–1.24)
GFR, Estimated: >60 mL/min (ref >60)
Glucose, Bld: 220 mg/dL — ABNORMAL HIGH (ref 70–99)
Potassium: 4.9 mmol/L (ref 3.5–5.1)
Sodium: 139 mmol/L (ref 135–145)

## 2023-06-01 LAB — CBC
HCT: 35.8 % — ABNORMAL LOW (ref 39.0–52.0)
Hemoglobin: 11.3 g/dL — ABNORMAL LOW (ref 13.0–17.0)
MCH: 28.6 pg (ref 26.0–34.0)
MCHC: 31.6 g/dL (ref 30.0–36.0)
MCV: 90.6 fL (ref 80.0–100.0)
Platelets: 170 10*3/uL (ref 150–400)
RBC: 3.95 MIL/uL — ABNORMAL LOW (ref 4.22–5.81)
RDW: 19.6 % — ABNORMAL HIGH (ref 11.5–15.5)
WBC: 4.1 10*3/uL (ref 4.0–10.5)
nRBC: 0 % (ref 0.0–0.2)

## 2023-06-01 LAB — GLUCOSE, CAPILLARY
Glucose-Capillary: 165 mg/dL — ABNORMAL HIGH (ref 70–99)
Glucose-Capillary: 193 mg/dL — ABNORMAL HIGH (ref 70–99)
Glucose-Capillary: 206 mg/dL — ABNORMAL HIGH (ref 70–99)
Glucose-Capillary: 181 mg/dL — ABNORMAL HIGH (ref 70–99)

## 2023-06-01 LAB — MRSA NEXT GEN BY PCR, NASAL: MRSA by PCR Next Gen: NOT DETECTED

## 2023-06-01 LAB — CULTURE, BLOOD (ROUTINE X 2)

## 2023-06-01 MED ORDER — CHLORHEXIDINE GLUCONATE CLOTH 2 % EX PADS
6.0000 | MEDICATED_PAD | Freq: Every day | CUTANEOUS | Status: DC
  Administered 2023-06-01: 6 via TOPICAL

## 2023-06-01 MED ORDER — BUDESONIDE 0.25 MG/2ML IN SUSP
0.2500 mg | Freq: Two times a day (BID) | RESPIRATORY_TRACT | Status: DC
  Administered 2023-06-01 – 2023-06-06 (×11): 0.25 mg via RESPIRATORY_TRACT
  Filled 2023-06-01 (×12): qty 2

## 2023-06-01 MED ORDER — IPRATROPIUM-ALBUTEROL 0.5-2.5 (3) MG/3ML IN SOLN: 3.0000 mL | RESPIRATORY_TRACT | Status: DC | PRN

## 2023-06-01 MED ORDER — ARFORMOTEROL TARTRATE 15 MCG/2ML IN NEBU
15.0000 ug | INHALATION_SOLUTION | Freq: Two times a day (BID) | RESPIRATORY_TRACT | Status: DC
  Administered 2023-06-01 – 2023-06-06 (×11): 15 ug via RESPIRATORY_TRACT
  Filled 2023-06-01 (×11): qty 2

## 2023-06-01 MED ORDER — FUROSEMIDE 10 MG/ML IJ SOLN
40.0000 mg | Freq: Once | INTRAMUSCULAR | Status: AC
  Administered 2023-06-01: 40 mg via INTRAVENOUS
  Filled 2023-06-01: qty 4

## 2023-06-01 MED ORDER — LORAZEPAM 2 MG/ML IJ SOLN
0.2500 mg | Freq: Once | INTRAMUSCULAR | Status: AC
  Administered 2023-06-01: 0.25 mg via INTRAVENOUS
  Filled 2023-06-01: qty 1

## 2023-06-01 MED ORDER — REVEFENACIN 175 MCG/3ML IN SOLN
175.0000 ug | Freq: Every day | RESPIRATORY_TRACT | Status: DC
  Administered 2023-06-02 – 2023-06-06 (×5): 175 ug via RESPIRATORY_TRACT
  Filled 2023-06-01 (×5): qty 3

## 2023-06-01 NOTE — Progress Notes (Signed)
PROGRESS NOTE    Daryl Vega [facility]  RKY:[id number] DOB: 15-Aug-1946 DOA: 05/31/2023 PCP: Loyola Mast, NP    Brief Narrative:  Daryl Vega  is a 77 y.o. male reformed smoker with past medical history relevant for history of prior PE, recurrent lung cancer, melanoma, DM2, HLD, chronic hypoxic respiratory failure on at least 5 L of oxygen via nasal cannula at home presents by EMS on 05/31/2023 with worsening respiratory failure and increased oxygen requirement -CT angiogram chest in December 2023 showed radiation changes at the right apex with soft tissue density around the right mainstem bronchus and invading into carina through tracheal wall.  He had bronchoscopy in December 2023 but Rt main bronchus was narrowed immediately after takeoff, the narrowing could not be traversed.  Repeat CT angio chest on 05/31/2023 shows Rt paratracheal and hilar mass extending into the superior and anterior aspect of Rt PA progressive tumor thrombus invading the Rt main PA.  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 Clarita Leber to [facility] as patient will need radiation oncology input-- consult placed   acute COPD exacerbation--- in the setting of #1  above -Azithromycin/Rocephin, bronchodilators and mucolytics as well as IV Solu-Medrol as ordered -PCCM following   acute on chronic hypoxic respiratory failure--due to #1 and #2 above ---Management as above    Social/Ethics--- plan of care and advanced directive discussed with patient and son Daryl Vega [facility]  -Patient request DNR/DNI status, -Otherwise no limitations to treatment -Palliative care consult requested to help further delineate goals of care   DM2-recent A1c 5.9 -Hold glipizide and metformin -SSI   chronic anemia---multi- factorial in the setting of underlying malignancy/chronic illness as well as iron deficiency - -stable, continue iron supplementation    history of PE (September 2016 )--- patient with underlying malignancy -High risk for DVT and PE---CTA chest on 05/31/2023 without acute PE at this time -Please see PCM transfer request note from Dr. Craige Cotta recommending against full anticoagulation at this time -May use Lovenox prophylactically at this time       DVT prophylaxis: enoxaparin (LOVENOX) injection 40 mg Start: 05/31/23 2200 SCDs Start: 05/31/23 1352 Place TED hose Start: 05/31/23 1352    Code Status: DNR   Disposition Plan:  Level of care: Stepdown Status is: Inpatient Remains inpatient appropriate    Consultants:  PCCM Rad onc Pallaitive care   Subjective: "Leave me alone"  Objective: Vitals:   06/01/23 0751 06/01/23 0800 06/01/23 0824 06/01/23 0900  BP: 126/76 134/87  113/72  Pulse: 97 (!) 53  98  Resp: (!) 23 (!) 23  19  Temp:      TempSrc:      SpO2: 96% 97% 93% 99%  Weight:      Height:        Intake/Output Summary (Last 24 hours) at 06/01/2023  1003 Last data filed at 06/01/2023 0700 Gross per 24 hour  Intake 50.22 ml  Output 250 ml  Net -199.78 ml   Filed Weights   05/31/23 0923 05/31/23 1730  Weight: 77.1 kg 68.8 kg    Examination:   General: Appearance:    Well developed, well nourished male in no acute  distress     Lungs:     respirations unlabored, on [place]  Heart:    Normal heart rate. Normal rhythm. No murmurs, rubs, or gallops.    MS:   All extremities are intact.    Neurologic:   Will awken, does not interact       Data Reviewed: I have personally reviewed following labs and imaging studies  CBC: Recent Labs  Lab 05/31/23 1016 06/01/23 0323  WBC 11.7* 4.1  NEUTROABS 9.0*  --   HGB 12.7* 11.3*  HCT 40.9 35.8*  MCV 91.7 90.6  PLT 186 170   Basic Metabolic Panel: Recent Labs  Lab 05/31/23 1016 06/01/23 0323  NA 138 139  K 4.3 4.9  CL 106 105  CO2 22 24  GLUCOSE 90 220*  BUN 34* 34*  CREATININE 1.16 1.21  CALCIUM 9.5 9.0   GFR: Estimated Creatinine Clearance: 51.3 mL/min (by C-G formula based on SCr of 1.21 mg/dL). Liver Function Tests: No results for input(s): "AST", "ALT", "ALKPHOS", "BILITOT", "PROT", "ALBUMIN" in the last 168 hours. No results for input(s): "LIPASE", "AMYLASE" in the last 168 hours. No results for input(s): "AMMONIA" in the last 168 hours. Coagulation Profile: No results for input(s): "INR", "PROTIME" in the last 168 hours. Cardiac Enzymes: No results for input(s): "CKTOTAL", "CKMB", "CKMBINDEX", "TROPONINI" in the last 168 hours. BNP (last 3 results) No results for input(s): "PROBNP" in the last 8760 hours. HbA1C: No results for input(s): "HGBA1C" in the last 72 hours. CBG: Recent Labs  Lab 05/31/23 1739 05/31/23 2121 06/01/23 0810  GLUCAP 139* 205* 165*   Lipid Profile: No results for input(s): "CHOL", "HDL", "LDLCALC", "TRIG", "CHOLHDL", "LDLDIRECT" in the last 72 hours. Thyroid Function Tests: No results for input(s): "TSH", "T4TOTAL", "FREET4", "T3FREE", "THYROIDAB" in the last 72 hours. Anemia Panel: No results for input(s): "VITAMINB12", "FOLATE", "FERRITIN", "TIBC", "IRON", "RETICCTPCT" in the last 72 hours. Sepsis Labs: Recent Labs  Lab 05/31/23 1017 05/31/23 1123  LATICACIDVEN 2.6* 2.6*    Recent Results (from  the past 240 hour(s))  SARS Coronavirus 2 by RT PCR (hospital order, performed in [facility] hospital lab) *cepheid single result test* Anterior Nasal Swab     Status: None   Collection Time: 05/31/23  9:53 AM   Specimen: Anterior Nasal Swab  Result Value Ref Range Status   SARS Coronavirus 2 by RT PCR NEGATIVE NEGATIVE Final    Comment: (NOTE) SARS-CoV-2 target nucleic acids are NOT DETECTED.  The SARS-CoV-2 RNA is generally detectable in upper and lower respiratory specimens during the acute phase of infection. The lowest concentration of SARS-CoV-2 viral copies this assay can detect is 250 copies / mL. A negative result does not preclude SARS-CoV-2 infection and should not be used as the sole basis for treatment or other patient management decisions.  A negative result may occur with improper specimen collection / handling, submission of specimen other than nasopharyngeal swab, presence of viral mutation(s) within the areas targeted by this assay, and inadequate number of viral copies (<250 copies / mL). A negative result must be combined with clinical observations, patient history, and epidemiological information.  Fact Sheet for Patients:  RoadLapTop.co.za  Fact Sheet for Healthcare Providers: http://kim-miller.com/  This test is not yet approved or  cleared by the [place] FDA and has been authorized for detection and/or diagnosis of SARS-CoV-2 by FDA under an Emergency Use Authorization (EUA).  This EUA will remain in effect (meaning this test can be used) for the duration of the COVID-19 declaration under Section 564(b)(1) of the Act, 21 U.S.C. section 360bbb-3(b)(1), unless the authorization is terminated or revoked sooner.  Performed at [facility], [address], [place], [place] [address]   Culture, blood (routine x 2)     Status: None (Preliminary result)   Collection Time: 05/31/23 10:16 AM   Specimen: Right  Antecubital; Blood  Result Value Ref Range Status   Specimen Description   Final    RIGHT ANTECUBITAL BOTTLES DRAWN AEROBIC AND ANAEROBIC   Special Requests Blood Culture adequate volume  Final   Culture   Final    NO GROWTH < 24 HOURS Performed at [facility], [address], [place], [place] [address]    Report Status PENDING  Incomplete  Culture, blood (routine x 2)     Status: None (Preliminary result)   Collection Time: 05/31/23 10:17 AM   Specimen: BLOOD RIGHT HAND  Result Value Ref Range Status   Specimen Description   Final    BLOOD RIGHT HAND BOTTLES DRAWN AEROBIC AND ANAEROBIC   Special Requests Blood Culture adequate volume  Final   Culture   Final    NO GROWTH < 24 HOURS Performed at [facility], [address] [place], [place] [address]    Report Status PENDING  Incomplete         Radiology Studies: ECHOCARDIOGRAM LIMITED  Result Date: 05/31/2023    ECHOCARDIOGRAM REPORT   Patient Name:   Daryl Vega Date of Exam: 05/31/2023 Medical Rec #:  [id number]               Height:       69.0 in Accession #:    [id number]              Weight:       170.0 lb Date of Birth:  [birth year]               BSA:          1.928 m Patient Age:    76 years                BP:           104/66 mmHg Patient Gender: M                       HR:           121 bpm. Exam Location:  [facility] Procedure: Limited Echo, Limited Color Doppler and Cardiac Doppler Indications:    Dyspnea R06.00  History:        Patient has prior history of Echocardiogram examinations, most                 recent 05/03/2020. COPD; Risk Factors:Diabetes, Dyslipidemia and                 Former Smoker. Hx of lung cancer.  Sonographer:    Celesta Gentile RCS Referring Phys: [phone] [facility]  Sonographer Comments: No parasternal window and suboptimal apical window. EXTREMELY difficult echo. IMPRESSIONS  1. "B" lines noted on chest ultrasound, suggestive of pulmonary edema.  2. Left ventricular ejection  fraction, by  estimation, is 25%. The left ventricle has severely decreased function. The left ventricle demonstrates global hypokinesis. Left ventricular diastolic parameters are indeterminate.  3. Right ventricular systolic function is severely reduced. The right ventricular size is moderately enlarged. There is normal pulmonary artery systolic pressure. The estimated right ventricular systolic pressure is 29.3 mmHg.  4. Left atrial size was mildly dilated.  5. Right atrial size was severely dilated.  6. A small pericardial effusion is present. The pericardial effusion is surrounding the apex.  7. The mitral valve is degenerative. No evidence of mitral valve regurgitation.  8. The aortic valve is grossly normal. Aortic valve regurgitation is not visualized. No aortic stenosis is present.  9. The inferior vena cava is dilated in size with <50% respiratory variability, suggesting right atrial pressure of 15 mmHg. FINDINGS  Left Ventricle: Left ventricular ejection fraction, by estimation, is 25%. The left ventricle has severely decreased function. The left ventricle demonstrates global hypokinesis. The left ventricular internal cavity size was normal in size. Suboptimal image quality limits for assessment of left ventricular hypertrophy. Left ventricular diastolic parameters are indeterminate. Right Ventricle: The right ventricular size is moderately enlarged. Right vetricular wall thickness was not well visualized. Right ventricular systolic function is severely reduced. There is normal pulmonary artery systolic pressure. The tricuspid regurgitant velocity is 1.89 m/s, and with an assumed right atrial pressure of 15 mmHg, the estimated right ventricular systolic pressure is 29.3 mmHg. Left Atrium: Left atrial size was mildly dilated. Right Atrium: Right atrial size was severely dilated. Pericardium: A small pericardial effusion is present. The pericardial effusion is surrounding the apex. Presence of epicardial fat layer. Mitral  Valve: The mitral valve is degenerative in appearance. No evidence of mitral valve regurgitation. Tricuspid Valve: The tricuspid valve is grossly normal. Tricuspid valve regurgitation is mild. Aortic Valve: The aortic valve is grossly normal. Aortic valve regurgitation is not visualized. No aortic stenosis is present. Pulmonic Valve: The pulmonic valve was not well visualized. Pulmonic valve regurgitation is not visualized. Aorta: Aortic root could not be assessed. Venous: The inferior vena cava is dilated in size with less than 50% respiratory variability, suggesting right atrial pressure of 15 mmHg. IAS/Shunts: The atrial septum is grossly normal.  AORTIC VALVE LVOT Vmax:   68.40 cm/s LVOT Vmean:  49.300 cm/s LVOT VTI:    0.105 m MITRAL VALVE               TRICUSPID VALVE MV Area (PHT): 6.96 cm    TR Peak grad:   14.3 mmHg MV Decel Time: 109 msec    TR Vmax:        189.00 cm/s MV E velocity: 81.90 cm/s                            SHUNTS                            Systemic VTI: 0.10 m Daryl Brass MD Electronically signed by Daryl Brass MD Signature Date/Time: 05/31/2023/2:54:48 PM    Final    CT Angio Chest PE W and/or Wo Contrast  Result Date: 05/31/2023 CLINICAL DATA:  Pulmonary embolism suspected, high probability. Increasing shortness of breath beginning yesterday. Hypoxia. EXAM: CT ANGIOGRAPHY CHEST WITH CONTRAST TECHNIQUE: Multidetector CT imaging of the chest was performed using the standard protocol during bolus administration of intravenous contrast. Multiplanar CT image reconstructions and MIPs were obtained  to evaluate the vascular anatomy. RADIATION DOSE REDUCTION: This exam was performed according to the departmental dose-optimization program which includes automated exposure control, adjustment of the mA and/or kV according to patient size and/or use of iterative reconstruction technique. CONTRAST:  75mL OMNIPAQUE IOHEXOL 350 MG/ML SOLN COMPARISON:  CT angio chest 11/24/2022 and 02/12/2023.  FINDINGS: Cardiovascular: Heart size is normal. Atherosclerotic calcifications are present the aortic arch and great vessel origins without significant aneurysm, stenosis or change. 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 Tumor is nonocclusive. No distal pulmonary embolus is present. Pulmonary branch vessels are within normal limits. Mediastinum/Nodes: Right paratracheal and hilar mass extends into the superior and anterior aspect of the right main pulmonary artery. The lesion measures at least 23 x 16 x 21 mm. Lungs/Pleura: Extensive architectural changes present in the right lung following previous radiation. Parenchymal changes are stable. Moderate loculated right pleural effusion is present. A left pleural effusion has increased since the prior exam. Areas of dependent atelectasis are present in the left lung. Upper Abdomen: Cholecystectomy noted. Visualized liver is unremarkable. The visualized pancreas and spleen are within normal limits. A water density lesion in the posterior left kidney is stable measuring 20 mm. Other solid organ lesions are present. Cholecystectomy noted. Musculoskeletal: The vertebral body heights and alignment normal. Review of the MIP images confirms the above findings. IMPRESSION: 1. 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 2. Extensive architectural changes in the right lung following previous radiation. 3. Moderate loculated right pleural effusion. 4. Increased size of left pleural effusion with associated dependent atelectasis. 5. Stable 20 mm water density lesion in the posterior left kidney, likely a cyst. Recommend no imaging follow-up. 6.  Aortic Atherosclerosis (ICD10-I70.0). These results were called by telephone at the time of interpretation on 05/31/2023 at 12:23 pm to provider [facility] , who verbally acknowledged these results. Electronically Signed   By: Marin Roberts M.D.   On: 05/31/2023  12:25   DG Chest Port 1 View  Result Date: 05/31/2023 CLINICAL DATA:  Shortness of breath EXAM: PORTABLE CHEST 1 VIEW COMPARISON:  03/18/2023 FINDINGS: Similar appearance of the right lung where there have been extensive post treatment changes with scarring and volume loss. Loculated right pleural effusion. Interval development of extensive interstitial opacity throughout the left lung. No left-sided pleural effusion. Skin fold overlies the periphery of the left hemithorax without evidence of a pneumothorax. IMPRESSION: 1. Interval development of extensive interstitial opacity throughout the left lung, which may represent edema or atypical infection. 2. Similar appearance of the right lung where there have been extensive post treatment changes with scarring and volume loss. Electronically Signed   By: Duanne Guess D.O.   On: 05/31/2023 10:13        Scheduled Meds:  arformoterol  15 mcg Nebulization BID   budesonide (PULMICORT) nebulizer solution  0.25 mg Nebulization BID   Chlorhexidine Gluconate Cloth  6 each Topical Daily   dextromethorphan-guaiFENesin  1 tablet Oral BID   enoxaparin (LOVENOX) injection  40 mg Subcutaneous Q24H   ferrous sulfate  325 mg Oral Q1500   gabapentin  900 mg Oral TID   insulin aspart  0-5 Units Subcutaneous QHS   insulin aspart  0-6 Units Subcutaneous TID WC   methylPREDNISolone (SOLU-MEDROL) injection  40 mg Intravenous Q12H   mirtazapine  7.5 mg Oral QHS   revefenacin  175 mcg Nebulization Daily   rosuvastatin  20 mg Oral QHS   sodium chloride flush  3  mL Intravenous Q12H   Continuous Infusions:  azithromycin     cefTRIAXone (ROCEPHIN)  IV Stopped (06/01/23 0959)     LOS: 1 day    Time spent: 45 minutes spent on chart review, discussion with nursing staff, consultants, updating family and interview/physical exam; more than 50% of that time was spent in counseling and/or coordination of care.    Joseph Art, DO [facility] Available  via Epic secure chat 7am-7pm After these hours, please refer to coverage provider listed on [email or web address] 06/01/2023, 10:03 AM

## 2023-06-01 NOTE — Consult Note (Signed)
Consultation Note Date: 06/01/2023   Patient Name: Daryl Vega [facility]  DOB: 1946/10/25  MRN: [id number]  Age / Sex: 77 y.o., male  PCP: Loyola Mast, NP Referring Physician: Joseph Art, DO  Reason for Consultation: Establishing goals of care  HPI/Patient Profile: 77 y.o. male  admitted on 05/31/2023    Clinical Assessment and Goals of Care: 77 year old with COPD chronic respiratory failure and lung cancer, sees Dr. Leafy Half in the outpatient setting.  Was hospitalized in December.  Was also hospitalized a few months ago.  Was transferred from [facility].  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 Patient lives by himself with his dogs.  At baseline, he is able to use a walker to do his own activities of daily living however son describes that patient is mostly sedentary. 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  Patient has right upper lobe radiation fibrosis and concern for reoccurrence.  Has chronic loculated right pleural effusion as well.  Had a high O2 requirements initially required BiPAP now on 10 L supplemental oxygen via nasal cannula Is on bronchodilators, IV Solu-Medrol, empiric antibiotics. Radiation oncology has been consulted and patient is considering XRT short course. Palliative consult for goals of care discussions.  Patient awakens easily but is confused son is at bedside.  He has just discussed with radiation oncology.  Chart reviewed, palliative consult continues.  Discussed with the son at patient's bedside in detail. Palliative medicine is specialized medical care for people living with serious illness. It  focuses on providing relief from the symptoms and stress of a serious illness. The goal is to improve quality of life for both the patient and the family. Goals of care: Broad aims of medical therapy in relation to the patient's values and preferences. Our aim is to provide medical care aimed at enabling patients to achieve the goals that matter most to them, given the circumstances of their particular medical situation and their constraints.    NEXT OF KIN  Son Daryl Vega is next of kin, patient lives alone.   SUMMARY OF RECOMMENDATIONS   Goals of care discussions: Discussed with the patient's son as patient currently unable to participate in broad goals of care discussions.  Patient's son describes that his cancer care thus far.  He describes that the patient has been mostly sedentary at home, uses walker to do basic activities of daily living.  Patient has had escalating oxygen needs.  Patient's son is asking for higher oxygen concentrator at home when the patient is discharged.  Patient's son has met and extensively discussed with radiation oncology, plan is to have repeat conversations on 06-02-2023 hopefully when the patient himself can participate.  Today, at the time of initial palliative consultation, discussed with patient's son about monitoring hospital course and overall disease trajectory  of illness, discussed about CODE STATUS being DO NOT RESUSCITATE and this was agreed upon, discussed about what pathway of care will look like if the patient does not want to go forward with short course of radiation.  Briefly discussed about patient's current condition and what would provide with best quality of life.  Goals of care discussions ongoing and to continue.  Will add low-dose IV Ativan to be used once to have the patient rest well overnight as per her request. PMT will continue to follow. Thank you for the consult.  Code Status/Advance Care Planning: DNR   Symptom Management:    Ativan PRN.   Palliative Prophylaxis:  Delirium Protocol  Additional Recommendations (Limitations, Scope, Preferences): Considering brief course of XRT  Psycho-social/Spiritual:  Desire for further Chaplaincy support:yes Additional Recommendations: Education on Hospice  Prognosis:  guarded  Discharge Planning: To Be Determined      Primary Diagnoses: Present on Admission:  Acute on chronic hypoxic respiratory failure (HCC)  Type 2 diabetes, controlled, 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HCC)  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 and BI 50% obst   Lung cancer (HCC)  Anemia of chronic disease  COPD mixed type (HCC)   I have reviewed the medical record, interviewed the patient and family, and examined the patient. The following aspects are pertinent.  Past Medical History:  Diagnosis Date   Anemia    C. difficile enteritis    Cholecystoduodenal fistula    COPD (chronic obstructive pulmonary disease) (HCC)    DM (diabetes mellitus) (HCC)    Duodenal ulcer    Elevated LFTs    FH: chemotherapy    Gastroparesis 02/09/2015   Hypercholesteremia    Lung cancer (HCC)    Melanoma (HCC)    Pneumonia    Protein calorie malnutrition (HCC)    Pulmonary embolus (HCC)    Radiation 08/03/14-08/23/14   35 gray to right chest   Social History   Socioeconomic History   Marital status: Married    Spouse name: Not on file   Number of children: Not on file   Years of education: Not on file   Highest education level: Not on file  Occupational History   Occupation: [employer] doctor   Tobacco Use   Smoking status: Former    Packs/day: 2.00    Years: 50.00    Additional pack years: 0.00    Total pack years: 100.00    Types: Cigarettes, Cigars    Quit date: 06/12/2014    Years since quitting: 8.9   Smokeless tobacco: Former    Types: Chew    Quit date: 09/07/2012  Vaping Use   Vaping Use: Never used  Substance and Sexual Activity   Alcohol use: No   Drug use: No    Sexual activity: Never  Other Topics Concern   Not on file  Social History Narrative   Not on file   Social Determinants of Health   Financial Resource Strain: Not on file  Food Insecurity: Patient Unable To Answer (05/31/2023)   Hunger Vital Sign    Worried About Running Out of Food in the Last Year: Patient unable to answer    Ran Out of Food in the Last Year: Patient unable to answer  Transportation Needs: Patient Unable To Answer (05/31/2023)   PRAPARE - Transportation    Lack of Transportation (Medical): Patient unable to answer    Lack of Transportation (Non-Medical): Patient unable to answer  Physical Activity: Not on file  Stress: Not on file  Social Connections: Not on file   Family History  Problem Relation Age of Onset   Heart disease Mother    Cancer Mother        ? type   Scheduled Meds:  arformoterol  15 mcg Nebulization BID   budesonide (PULMICORT) nebulizer solution  0.25 mg Nebulization BID   Chlorhexidine Gluconate Cloth  6 each Topical Daily   dextromethorphan-guaiFENesin  1 tablet Oral BID   enoxaparin (LOVENOX) injection  40 mg Subcutaneous Q24H   ferrous sulfate  325 mg Oral Q1500   gabapentin  900 mg Oral TID   insulin aspart  0-5 Units Subcutaneous QHS   insulin aspart  0-6 Units Subcutaneous TID WC   methylPREDNISolone (SOLU-MEDROL) injection  40 mg Intravenous Q12H   mirtazapine  7.5 mg Oral QHS   revefenacin  175 mcg Nebulization Daily   rosuvastatin  20 mg Oral QHS   sodium chloride flush  3 mL Intravenous Q12H   Continuous Infusions:  azithromycin Stopped (06/01/23 1109)   cefTRIAXone (ROCEPHIN)  IV Stopped (06/01/23 0956)   PRN Meds:.acetaminophen **OR** acetaminophen, bisacodyl, fentaNYL (SUBLIMAZE) injection, ipratropium-albuterol, LORazepam, ondansetron **OR** ondansetron (ZOFRAN) IV, polyethylene glycol, traMADol, traZODone Medications Prior to Admission:  Prior to Admission medications   Medication Sig Start Date End Date Taking?  Authorizing Provider  acetaminophen (TYLENOL) 325 MG tablet Take 2 tablets (650 mg total) by mouth every 6 (six) hours as needed for mild pain (or Fever >/= 101). 10/01/15  Yes Alison Murray, MD  albuterol (PROVENTIL) (2.5 MG/3ML) 0.083% nebulizer solution Take 3 mLs (2.5 mg total) by nebulization every 4 (four) hours as needed for wheezing or shortness of breath. 12/03/22 12/03/23 Yes Oretha Milch, MD  albuterol (VENTOLIN HFA) 108 (90 Base) MCG/ACT inhaler Inhale 2 puffs into the lungs every 4 (four) hours as needed. Patient taking differently: Inhale 2 puffs into the lungs every 4 (four) hours as needed for wheezing or shortness of breath. 11/28/22  Yes Emokpae, Courage, MD  ferrous sulfate 324 (65 Fe) MG TBEC Take 1 tablet (325 mg total) by mouth daily in the afternoon. 02/16/23  Yes Tyrone Nine, MD  Fluticasone Furoate-Vilanterol (BREO ELLIPTA IN) Inhale 2 puffs into the lungs 2 (two) times daily. Pt gets samples from his Doctor.   Yes [provider]  gabapentin (NEURONTIN) 300 MG capsule Take 900 mg by mouth 3 (three) times daily.  03/30/20  Yes [provider]  glipiZIDE (GLUCOTROL XL) 10 MG 24 hr tablet Take 1 tablet (10 mg total) by mouth daily with breakfast. 11/28/22  Yes Emokpae, Courage, MD  metFORMIN (GLUCOPHAGE) 500 MG tablet Take 500 mg by mouth 3 (three) times daily. 02/25/23  Yes [provider]  mirtazapine (REMERON) 7.5 MG tablet Take 7.5 mg by mouth at bedtime. 11/18/22  Yes [provider]  rosuvastatin (CRESTOR) 20 MG tablet Take 20 mg by mouth at bedtime. 04/04/20  Yes [provider]  traMADol (ULTRAM) 50 MG tablet Take 50 mg by mouth every 6 (six) hours as needed for moderate pain or severe pain.  04/21/20  Yes [provider]  amoxicillin-clavulanate (AUGMENTIN) 875-125 MG tablet Take 1 tablet by mouth 2 (two) times daily. Patient not taking: Reported on 05/31/2023 02/20/23   Oretha Milch, MD  metFORMIN (GLUCOPHAGE) 1000 MG  tablet Take 1 tablet (1,000 mg total) by mouth 2 (two) times daily with a meal. Patient not taking: Reported on 05/31/2023 11/28/22   Shon Hale, MD  TRELEGY ELLIPTA 100-62.5-25 MCG/ACT AEPB Inhale 1 puff into the lungs daily. Patient not taking: Reported on 05/31/2023 03/09/23   Oretha Milch, MD   No Known Allergies Review of Systems + weakness  Physical Exam Off BiPAP since the start of the shift, now on supplemental oxygen via nasal cannula O2 needs noted. Awake alert appears chronically ill son at bedside Oriented to self, answers a few questions, confused other times. Monitor noted Diminished breath sounds right side Skin is warm and dry Does not have edema  Vital Signs: BP 109/84   Pulse (!) 108   Temp 98.3 F (36.8 C) (Axillary)   Resp 11   Ht 5\' 9"  (1.753 m)   Wt 68.8 kg   SpO2 97%   BMI 22.40 kg/m  Pain Scale: 0-10   Pain Score: Asleep   SpO2: SpO2: 97 % O2 Device:SpO2: 97 % O2 Flow Rate: .O2 Flow Rate (L/min): 10 L/min  IO: Intake/output summary:  Intake/Output Summary (Last 24 hours) at 06/01/2023 1455 Last data filed at 06/01/2023 1341 Gross per 24 hour  Intake 900.22 ml  Output 550 ml  Net 350.22 ml    LBM: Last BM Date :  (PTA) Baseline Weight: Weight: 77.1 kg Most recent weight: Weight: 68.8 kg     Palliative Assessment/Data:   PPS 50%  Time In:  1400 Time Out:  1500 Time Total:  60  Greater than 50%  of this time was spent counseling and coordinating care related to the above assessment and plan.  Signed by: Rosalin Hawking, MD   Please contact Palliative Medicine Team phone at [phone] for questions and concerns.  For individual provider: See Loretha Stapler

## 2023-06-01 NOTE — Progress Notes (Signed)
NAME:  Daryl Vega, MRN:  [id number], DOB:  1946/06/06, LOS: 1 ADMISSION DATE:  05/31/2023, CONSULTATION DATE:  05/31/2023 REFERRING MD:  Dr. Mariea Clonts, CHIEF COMPLAINT:  short of breath   History of Present Illness:  77 yo male with COPD, chronic respiratory failure and lung cancer followed by Dr. Vassie Loll.  He was in hospital in December 2023 with dyspnea and and hypoxia CT angiogram chest showed radiation changes at the right apex with soft tissue density around the right mainstem bronchus and invading into carina through tracheal wall.  He had bronchoscopy in December 2023 but Rt main bronchus was narrowed immediately after takeoff, the narrowing could not be traversed.  He presented to [facility] with worsening dyspnea.  CT angio chest from 05/31/23 shows Rt paratracheal and hilar mass extending into the superior and anterior aspect of Rt PA progressive tumor thrombus invading the Rt main PA.  He was started on therapy for COPD exacerbation with Bipap, and transferred to [facility] for further management.  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 with intermittent use of BiPAP  Interim History / Subjective:  Will intermittently state name and date of birth  Objective   BP 122/78 (BP Location: Left Arm)   Pulse (!) 108   Temp 97.9 F (36.6 C) (Axillary)   Resp (!) 22   Ht 5\' 9"  (1.753 m)   Wt 68.8 kg   SpO2 97%   BMI 22.40 kg/m   FiO2 (%):  [40 %-50 %] 50 %   Intake/Output Summary (Last 24 hours) at 06/01/2023 1045 Last data filed at 06/01/2023 0700 Gross per 24 hour  Intake 50.22 ml  Output 250 ml  Net -199.78 ml    Filed Weights   05/31/23 0923 05/31/23 1730  Weight: 77.1 kg 68.8 kg    Examination: General: Acute on chronically ill appearing elderly male sitting up in bed, in NAD HEENT: [place]/AT, MM pink/moist, PERRL,  Neuro: Alert and oriented to self only, delirious  CV: s1s2 regular rate and rhythm, no murmur, rubs, or gallops,  PULM: Diminished right upper side, no increased work of breathing, on 10 L nasal cannula GI: soft, bowel sounds active in all 4 quadrants, non-tender, non-distended Extremities: warm/dry, no edema  Skin: no rashes or lesions  Resolved Hospital Problem list     Assessment & Plan:   Acute on chronic hypoxic respiratory failure from COPD exacerbation. -Utilizes 5 L nasal cannula at baseline Tumor thrombus of Rt pulmonary artery Hx of Stage 3b NSCLC (squamous cell) from August 2015 s/p chemo and XRT with Rt upper lobe radiation fibrosis concern for reoccurence  Chronic loculated Rt pleural effusion -Very difficult situation and not clear there are any good options for this, he has too many co-morbidities to be a candidate for surgery P: Continue supplemental oxygen for SpO2 goal greater than 90 As needed BiPAP Follow intermittent chest x-ray and ABG Continue bronchodilators Continue IV Solu-Medrol Continue empiric ceftriaxone and azithromycin Patient oncology consulted and considering XRT May need to consider  thoracentesis if respiratory decompensation occurs   Goals of care. -DNR/DNI -Given that he has tumor invasion of his pulmonary artery, his prognosis is grim and  it is uncertain whether there are even any viable treatment options -Palliative care consulted -Pt is not able to participate in goals of care discussion at present due to delirium  Best Practice (right click and "Reselect all SmartList Selections" daily)   Diet/type: clear liquids DVT prophylaxis: LMWH GI prophylaxis: N/A Lines: N/A Foley:  N/A Code Status:  DNR Last date of  multidisciplinary goals of care discussion  Pending   Signature:  Joniqua Sidle D. Harris, NP-C [facility] Personal contact information can be found on Amion  If no contact or response made please call [phone] 06/01/2023, 12:31 PM

## 2023-06-01 NOTE — Progress Notes (Signed)
Radiation Oncology         ([phone]) [phone] ________________________________  Initial Inpatient Consultation  Name: Daryl Vega MRN: [id number]  Date: 06/01/2023  DOB: [birth year]  WU:JWJX, Vernona Rieger, NP  Loyola Mast, NP   REFERRING PHYSICIAN: Loyola Mast, NP  DIAGNOSIS: There were no encounter diagnoses.  Recent imaging concerning for recurrent lung cancer with hypoxemic respiratory failure secondary to extension/invasion of a right paratracheal and hilar mass into the right main pulmonary artery   Stage IIIB (T3, N3, M0) non-small cell lung cancer, squamous cell carcinoma presenting with right upper lobe obstructing mass as well as mediastinal and right supraclavicular lymphadenopathy diagnosed in August of 2015: s/p chemotherapy and radiation    HISTORY OF PRESENT ILLNESS::Daryl Vega is a 77 y.o. male who is accompanied by his son. he is seen today as a courtesy of Dr. Benjamine Mola for an opinion concerning radiation therapy as part of management for his recent suspected recurrence of right lung cancer. The patient is known to me for his initial diagnosis of non-small cell right lung cancer in 2015, s/p chemotherapy and radiation therapy completed in September of 2015. Since that time he has continued to follow with Dr. Thomasena Edis at [facility] and Dr. Vassie Loll at [facility].   Prior to his present hospitalization, the patient was admitted from 11/24/22 through 11/28/22 after presenting with progressive exertional dyspnea x 1 month and hypoxia. (He was also diagnosed with pneumonia 2 months prior which was complicated due to prolonged/persistent symptoms despite abx). CTA of the chest performed at that time showed progressive postradiation changes with increased volume loss in the right apex, along with a new mild increase in soft tissue density surrounding the right mainstem bronchus. Findings were though to be suspicious for soft tissue growth into the trachea through the  tracheal wall and possibly concerning for neoplasm. Hospital course included steroids, bronchodilators and mucolytic's with improvement in his symptoms.    He also underwent bronchoscopy for biopsies while admitted on 11/25/22 under the care of Dr. Vassie Loll. Biopsies/washings of the right lung apex and right mainstem showed atypical cells present. Per. Dr. Vassie Loll, the lesion in the bronchus was not concerning for neoplasm at that time.     The patient accordingly followed up with Dr. Vassie Loll who recommended repeat imaging. Subsequent chest CT on 02/02/23 demonstrated nonspecific new mild right supraclavicular and left prevascular adenopathy potentially concerning for recurrent metastatic disease (although reactive adenopathy remained a differential consideration given the waxing and waning regions of patchy bilateral lung consolidation suggestive of recurrent aspiration). CT also showed: stable radiation fibrosis in the upper right perihilar and upper right lung; a stable mild nonspecific soft tissue density along the low right tracheal wall; and a mild increase in a small right basilar pleural effusion.   He was again admitted from 02/08/23 through 02/16/23 with severe sepsis secondary to right-sided pneumonia (H. influenzae bacteremia). His presentation included fever, dyspnea, tachypnea, leukocytosis, lactic acidosis. CTA of the chest performed showed a right-sided consolidation suspicious for pneumonia and blood cultures showed haemophilus influenza (as noted above). Hospital course consisted of abx and management for acute on chronic hypoxic respiratory failure/COPD. The patient however left AMA prior to receiving further work-up/management to rule out definitive disease recurrence.    - He also underwent right sided thoracentesis while admitted on 02/29. Pleural fluid was sent for cytology and showed reactive mesothelial cells without evidence of malignancy.   Present Admission:   The patient presented to  the ED yesterday (05/31/23) via  EMS with worsening respiratory failure and increased oxygen requirements.   CTA of the chest performed on 05/31/2023 shows: extension of the right paratracheal and hilar mass into the superior and anterior aspect of the right main pulmonary artery, measuring at least 23 x 16 x 21 mm. Work-up thus far is concerning for recurrent lung cancer with hypoxemic respiratory failure caused by invasion of the right main pulmonary artery. ( CT findings also include extensive architectural changes in the right lung following previous radiation therapy, a moderate loculated right pleural effusion, and an increase in size of a left pleural effusion with associated dependent atelectasis).   Upon his initial arrival to the ED, his sats were in the mid 80's on 5L [place]. He has been subsequently placed on BiPAP to maintain his O2-sats in the 90's.   Other recent pertinent imaging performed includes a PET scan on 03/30/23 (per Dr. Vassie Loll) which demonstrated: relative stability of diffuse pleural thickening overlying the right lung; an interval decrease in size of a small right sided pleural effusion; extensive post treatment changes within the right lung including an area of masslike architectural distortion within the paramediastinal and perihilar right lung extending into the right lower lung; a new a 1.1 cm nodule within the posterior left upper lobe with an SUV max of 0.87 (favored to be infectious/inflammatory in etiology); and several nonspecific borderline enlarged right supraclavicular lymph nodes and prominent left pre-vascular lymph nodes. PET otherwise showed no signs of FDG avid metastatic disease to the abdomen or pelvis.   PREVIOUS RADIATION THERAPY: Yes  Diagnosis: Stage IIIB (T3, N3, M0) non-small cell lung cancer, squamous cell carcinoma presenting with right upper lobe obstructing mass as well as mediastinal and right supraclavicular lymphadenopathy diagnosed in August of 2015.   Radiation treatment dates:   August 20 through August 23, 2014 Site/dose:   Right upper central chest 35 gray in 14 fractions Beams/energy:   3-D conformal therapy  Previous Systemic Therapy:   08/30/2014 - 11/22/2014 Chemotherapy  Nab-paclitaxel and carboplatin, 5 cycles   PAST MEDICAL HISTORY:  Past Medical History:  Diagnosis Date   Anemia    C. difficile enteritis    Cholecystoduodenal fistula    COPD (chronic obstructive pulmonary disease) (HCC)    DM (diabetes mellitus) (HCC)    Duodenal ulcer    Elevated LFTs    FH: chemotherapy    Gastroparesis 02/09/2015   Hypercholesteremia    Lung cancer (HCC)    Melanoma (HCC)    Pneumonia    Protein calorie malnutrition (HCC)    Pulmonary embolus (HCC)    Radiation 08/03/14-08/23/14   35 gray to right chest    PAST SURGICAL HISTORY: Past Surgical History:  Procedure Laterality Date   CHOLECYSTECTOMY     duodenostomy tube     ESOPHAGOGASTRODUODENOSCOPY N/A 11/30/2014   Procedure: ESOPHAGOGASTRODUODENOSCOPY (EGD);  Surgeon: Rachael Fee, MD;  Location: [facility];  Service: Endoscopy;  Laterality: N/A;   FLEXIBLE BRONCHOSCOPY Bilateral 11/25/2022   Procedure: FLEXIBLE BRONCHOSCOPY;  Surgeon: Oretha Milch, MD;  Location: [facility];  Service: Pulmonary;  Laterality: Bilateral;   JEJUNOSTOMY FEEDING TUBE     LAPAROTOMY N/A 11/30/2014   Procedure: EXPLORATORY LAPAROTOMY, PYLOROPLASTY, OVERSEWING OF POSTERIOR DUODENUM, DUODENOSTOMY, CHOLECYSTECTOMY, JEJEUNOSTOMY;  Surgeon: Avel Peace, MD;  Location: [facility];  Service: General;  Laterality: N/A;   PYLOROPLASTY     VIDEO BRONCHOSCOPY Bilateral 07/20/2014   Procedure: VIDEO BRONCHOSCOPY WITHOUT FLUORO;  Surgeon: Nyoka Cowden, MD;  Location: [facility];  Service: Cardiopulmonary;  Laterality: Bilateral;    FAMILY HISTORY:  Family History  Problem Relation Age of Onset   Heart disease Mother    Cancer Mother        ? type    SOCIAL HISTORY:  Social History    Tobacco Use   Smoking status: Former    Packs/day: 2.00    Years: 50.00    Additional pack years: 0.00    Total pack years: 100.00    Types: Cigarettes, Cigars    Quit date: 06/12/2014    Years since quitting: 8.9   Smokeless tobacco: Former    Types: Chew    Quit date: 09/07/2012  Vaping Use   Vaping Use: Never used  Substance Use Topics   Alcohol use: No   Drug use: No    ALLERGIES: No Known Allergies  MEDICATIONS:  No current facility-administered medications for this encounter.   No current outpatient medications on file.   Facility-Administered Medications Ordered in Other Encounters  Medication Dose Route Frequency Provider Last Rate Last Admin   acetaminophen (TYLENOL) tablet 650 mg  650 mg Oral Q6H PRN Emokpae, Courage, MD       Or   acetaminophen (TYLENOL) suppository 650 mg  650 mg Rectal Q6H PRN Emokpae, Courage, MD       arformoterol (BROVANA) nebulizer solution 15 mcg  15 mcg Nebulization BID Hunsucker, Lesia Sago, MD   15 mcg at 06/01/23 0824   azithromycin (ZITHROMAX) 500 mg in sodium chloride 0.9 % 250 mL IVPB  500 mg Intravenous Q24H Shon Hale, MD   Stopped at 06/01/23 1109   bisacodyl (DULCOLAX) suppository 10 mg  10 mg Rectal Daily PRN Emokpae, Courage, MD       budesonide (PULMICORT) nebulizer solution 0.25 mg  0.25 mg Nebulization BID Hunsucker, Lesia Sago, MD   0.25 mg at 06/01/23 0824   cefTRIAXone (ROCEPHIN) 1 g in sodium chloride 0.9 % 100 mL IVPB  1 g Intravenous Q24H Shon Hale, MD   Stopped at 06/01/23 0956   Chlorhexidine Gluconate Cloth 2 % PADS 6 each  6 each Topical Daily Noralee Stain, DO       dextromethorphan-guaiFENesin (MUCINEX DM) 30-600 MG per 12 hr tablet 1 tablet  1 tablet Oral BID Mariea Clonts, Courage, MD   1 tablet at 05/31/23 2154   enoxaparin (LOVENOX) injection 40 mg  40 mg Subcutaneous Q24H Emokpae, Courage, MD   40 mg at 05/31/23 2154   fentaNYL (SUBLIMAZE) injection 25 mcg  25 mcg Intravenous Q4H PRN Emokpae, Courage,  MD   25 mcg at 06/01/23 0155   ferrous sulfate tablet 325 mg  325 mg Oral Q1500 Emokpae, Courage, MD       gabapentin (NEURONTIN) capsule 900 mg  900 mg Oral TID Mariea Clonts, Courage, MD   900 mg at 05/31/23 2155   insulin aspart (novoLOG) injection 0-5 Units  0-5 Units Subcutaneous QHS Emokpae, Courage, MD   2 Units at 05/31/23 2156   insulin aspart (novoLOG) injection 0-6 Units  0-6 Units Subcutaneous TID WC Emokpae, Courage, MD   1 Units at 06/01/23 1244   ipratropium-albuterol (DUONEB) 0.5-2.5 (3) MG/3ML nebulizer solution 3 mL  3 mL Nebulization Q4H PRN Hunsucker, Lesia Sago, MD       LORazepam (ATIVAN) injection 0.5 mg  0.5 mg Intravenous Q12H PRN Emokpae, Courage, MD   0.5 mg at 05/31/23 2306   methylPREDNISolone sodium succinate (SOLU-MEDROL) 40 mg/mL injection 40 mg  40 mg Intravenous Q12H Madueme, Harriet Masson, RPH  40 mg at 06/01/23 0924   mirtazapine (REMERON) tablet 7.5 mg  7.5 mg Oral QHS Emokpae, Courage, MD   7.5 mg at 05/31/23 2156   ondansetron (ZOFRAN) tablet 4 mg  4 mg Oral Q6H PRN Emokpae, Courage, MD       Or   ondansetron (ZOFRAN) injection 4 mg  4 mg Intravenous Q6H PRN Emokpae, Courage, MD   4 mg at 05/31/23 1734   polyethylene glycol (MIRALAX / GLYCOLAX) packet 17 g  17 g Oral Daily PRN Emokpae, Courage, MD       revefenacin (YUPELRI) nebulizer solution 175 mcg  175 mcg Nebulization Daily Hunsucker, Lesia Sago, MD       rosuvastatin (CRESTOR) tablet 20 mg  20 mg Oral QHS Emokpae, Courage, MD   20 mg at 05/31/23 2154   sodium chloride flush (NS) 0.9 % injection 3 mL  3 mL Intravenous Q12H Emokpae, Courage, MD   3 mL at 06/01/23 0928   traMADol (ULTRAM) tablet 50 mg  50 mg Oral Q6H PRN Emokpae, Courage, MD       traZODone (DESYREL) tablet 50 mg  50 mg Oral QHS PRN Shon Hale, MD   50 mg at 05/31/23 2156    REVIEW OF SYSTEMS:  A 10+ POINT REVIEW OF SYSTEMS WAS OBTAINED including neurology, dermatology, psychiatry, cardiac, respiratory, lymph, extremities, GI, GU,  musculoskeletal, constitutional, reproductive, HEENT. ***   PHYSICAL EXAM:  vitals were not taken for this visit.   General: Alert and oriented, in no acute distress HEENT: Head is normocephalic. Extraocular movements are intact. Oropharynx is clear. Neck: Neck is supple, no palpable cervical or supraclavicular lymphadenopathy. Heart: Regular in rate and rhythm with no murmurs, rubs, or gallops. Chest: Clear to auscultation bilaterally, with no rhonchi, wheezes, or rales. Abdomen: Soft, nontender, nondistended, with no rigidity or guarding. Extremities: No cyanosis or edema. Lymphatics: see Neck Exam Skin: No concerning lesions. Musculoskeletal: symmetric strength and muscle tone throughout. Neurologic: Cranial nerves II through XII are grossly intact. No obvious focalities. Speech is fluent. Coordination is intact. Psychiatric: Judgment and insight are intact. Affect is appropriate. ***  ECOG = 3  0 - Asymptomatic (Fully active, able to carry on all predisease activities without restriction)  1 - Symptomatic but completely ambulatory (Restricted in physically strenuous activity but ambulatory and able to carry out work of a light or sedentary nature. For example, light housework, office work)  2 - Symptomatic, <50% in bed during the day (Ambulatory and capable of all self care but unable to carry out any work activities. Up and about more than 50% of waking hours)  3 - Symptomatic, >50% in bed, but not bedbound (Capable of only limited self-care, confined to bed or chair 50% or more of waking hours)  4 - Bedbound (Completely disabled. Cannot carry on any self-care. Totally confined to bed or chair)  5 - Death   Santiago Glad MM, Creech RH, Tormey DC, et al. [phone]). "Toxicity and response criteria of the [facility]". Am. Evlyn Clines. Oncol. 5 (6): 649-55  LABORATORY DATA:  Lab Results  Component Value Date   WBC 4.1 06/01/2023   HGB 11.3 (L) 06/01/2023   HCT 35.8 (L)  06/01/2023   MCV 90.6 06/01/2023   PLT 170 06/01/2023   NEUTROABS 9.0 (H) 05/31/2023   Lab Results  Component Value Date   NA 139 06/01/2023   K 4.9 06/01/2023   CL 105 06/01/2023   CO2 24 06/01/2023   GLUCOSE 220 (H) 06/01/2023  BUN 34 (H) 06/01/2023   CREATININE 1.21 06/01/2023   CALCIUM 9.0 06/01/2023      RADIOGRAPHY: ECHOCARDIOGRAM LIMITED  Result Date: 05/31/2023    ECHOCARDIOGRAM REPORT   Patient Name:   Daryl Vega Osowski Date of Exam: 05/31/2023 Medical Rec #:  [id number]               Height:       69.0 in Accession #:    [id number]              Weight:       170.0 lb Date of Birth:  08-16-1946               BSA:          1.928 m Patient Age:    76 years                BP:           104/66 mmHg Patient Gender: M                       HR:           121 bpm. Exam [facility] Procedure: Limited Echo, Limited Color Doppler and Cardiac Doppler Indications:    Dyspnea R06.00  History:        Patient has prior history of Echocardiogram examinations, most                 recent 05/03/2020. COPD; Risk Factors:Diabetes, Dyslipidemia and                 Former Smoker. Hx of lung cancer.  Sonographer:    Celesta Gentile RCS Referring Phys: [id number] [facility]  Sonographer Comments: No parasternal window and suboptimal apical window. EXTREMELY difficult echo. IMPRESSIONS  1. "B" lines noted on chest ultrasound, suggestive of pulmonary edema.  2. Left ventricular ejection fraction, by estimation, is 25%. The left ventricle has severely decreased function. The left ventricle demonstrates global hypokinesis. Left ventricular diastolic parameters are indeterminate.  3. Right ventricular systolic function is severely reduced. The right ventricular size is moderately enlarged. There is normal pulmonary artery systolic pressure. The estimated right ventricular systolic pressure is 29.3 mmHg.  4. Left atrial size was mildly dilated.  5. Right atrial size was severely dilated.  6. A small  pericardial effusion is present. The pericardial effusion is surrounding the apex.  7. The mitral valve is degenerative. No evidence of mitral valve regurgitation.  8. The aortic valve is grossly normal. Aortic valve regurgitation is not visualized. No aortic stenosis is present.  9. The inferior vena cava is dilated in size with <50% respiratory variability, suggesting right atrial pressure of 15 mmHg. FINDINGS  Left Ventricle: Left ventricular ejection fraction, by estimation, is 25%. The left ventricle has severely decreased function. The left ventricle demonstrates global hypokinesis. The left ventricular internal cavity size was normal in size. Suboptimal image quality limits for assessment of left ventricular hypertrophy. Left ventricular diastolic parameters are indeterminate. Right Ventricle: The right ventricular size is moderately enlarged. Right vetricular wall thickness was not well visualized. Right ventricular systolic function is severely reduced. There is normal pulmonary artery systolic pressure. The tricuspid regurgitant velocity is 1.89 m/s, and with an assumed right atrial pressure of 15 mmHg, the estimated right ventricular systolic pressure is 29.3 mmHg. Left Atrium: Left atrial size was mildly dilated. Right Atrium: Right atrial size was severely dilated. Pericardium: A small pericardial effusion  is present. The pericardial effusion is surrounding the apex. Presence of epicardial fat layer. Mitral Valve: The mitral valve is degenerative in appearance. No evidence of mitral valve regurgitation. Tricuspid Valve: The tricuspid valve is grossly normal. Tricuspid valve regurgitation is mild. Aortic Valve: The aortic valve is grossly normal. Aortic valve regurgitation is not visualized. No aortic stenosis is present. Pulmonic Valve: The pulmonic valve was not well visualized. Pulmonic valve regurgitation is not visualized. Aorta: Aortic root could not be assessed. Venous: The inferior vena cava is  dilated in size with less than 50% respiratory variability, suggesting right atrial pressure of 15 mmHg. IAS/Shunts: The atrial septum is grossly normal.  AORTIC VALVE LVOT Vmax:   68.40 cm/s LVOT Vmean:  49.300 cm/s LVOT VTI:    0.105 m MITRAL VALVE               TRICUSPID VALVE MV Area (PHT): 6.96 cm    TR Peak grad:   14.3 mmHg MV Decel Time: 109 msec    TR Vmax:        189.00 cm/s MV E velocity: 81.90 cm/s                            SHUNTS                            Systemic VTI: 0.10 m Weston Brass MD Electronically signed by Weston Brass MD Signature Date/Time: 05/31/2023/2:54:48 PM    Final    CT Angio Chest PE W and/or Wo Contrast  Result Date: 05/31/2023 CLINICAL DATA:  Pulmonary embolism suspected, high probability. Increasing shortness of breath beginning yesterday. Hypoxia. EXAM: CT ANGIOGRAPHY CHEST WITH CONTRAST TECHNIQUE: Multidetector CT imaging of the chest was performed using the standard protocol during bolus administration of intravenous contrast. Multiplanar CT image reconstructions and MIPs were obtained to evaluate the vascular anatomy. RADIATION DOSE REDUCTION: This exam was performed according to the departmental dose-optimization program which includes automated exposure control, adjustment of the mA and/or kV according to patient size and/or use of iterative reconstruction technique. CONTRAST:  75mL OMNIPAQUE IOHEXOL 350 MG/ML SOLN COMPARISON:  CT angio chest 11/24/2022 and 02/12/2023. FINDINGS: Cardiovascular: Heart size is normal. Atherosclerotic calcifications are present the aortic arch and great vessel origins without significant aneurysm, stenosis or change. 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 Tumor is nonocclusive. No distal pulmonary embolus is present. Pulmonary branch vessels are within normal limits. Mediastinum/Nodes: Right paratracheal and hilar mass extends into the superior and anterior aspect of the right main pulmonary artery. The  lesion measures at least 23 x 16 x 21 mm. Lungs/Pleura: Extensive architectural changes present in the right lung following previous radiation. Parenchymal changes are stable. Moderate loculated right pleural effusion is present. A left pleural effusion has increased since the prior exam. Areas of dependent atelectasis are present in the left lung. Upper Abdomen: Cholecystectomy noted. Visualized liver is unremarkable. The visualized pancreas and spleen are within normal limits. A water density lesion in the posterior left kidney is stable measuring 20 mm. Other solid organ lesions are present. Cholecystectomy noted. Musculoskeletal: The vertebral body heights and alignment normal. Review of the MIP images confirms the above findings. IMPRESSION: 1. 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 2. Extensive architectural changes in the right lung following previous radiation. 3. Moderate loculated right pleural effusion. 4. Increased size of left pleural effusion with  associated dependent atelectasis. 5. Stable 20 mm water density lesion in the posterior left kidney, likely a cyst. Recommend no imaging follow-up. 6.  Aortic Atherosclerosis (ICD10-I70.0). These results were called by telephone at the time of interpretation on 05/31/2023 at 12:23 pm to provider [facility] , who verbally acknowledged these results. Electronically Signed   By: Marin Roberts M.D.   On: 05/31/2023 12:25   DG Chest Port 1 View  Result Date: 05/31/2023 CLINICAL DATA:  Shortness of breath EXAM: PORTABLE CHEST 1 VIEW COMPARISON:  03/18/2023 FINDINGS: Similar appearance of the right lung where there have been extensive post treatment changes with scarring and volume loss. Loculated right pleural effusion. Interval development of extensive interstitial opacity throughout the left lung. No left-sided pleural effusion. Skin fold overlies the periphery of the left hemithorax without evidence of a  pneumothorax. IMPRESSION: 1. Interval development of extensive interstitial opacity throughout the left lung, which may represent edema or atypical infection. 2. Similar appearance of the right lung where there have been extensive post treatment changes with scarring and volume loss. Electronically Signed   By: Duanne Guess D.O.   On: 05/31/2023 10:13      IMPRESSION: Recent imaging concerning for recurrent lung cancer with hypoxemic respiratory failure secondary to extension/invasion of a right paratracheal and hilar mass into the right main pulmonary artery   Imaging shows right paratracheal and hilar mass extending into the right main pulmonary artery concerning for recurrent lung cancer. Due to the location of the mass there is concern for major bleed if it is not treated. Patient is a candidate for palliative radiotherapy to the visualized mass.   Today, I talked to the patient and family about the findings and work-up thus far.  We discussed the natural history of lung masses that extend into the major arteries and general treatment, highlighting the role of radiotherapy in the management.  We discussed the available radiation techniques, and focused on the details of logistics and delivery.  We reviewed the anticipated acute and late sequelae associated with radiation in this setting. Due to the location of this tumor, we specifically discussed the risk of a major bleed from the radiation that could lead to a  The patient was encouraged to ask questions that I answered to the best of my ability.   PLAN: ***   60 minutes of total time was spent for this patient encounter, including preparation, face-to-face counseling with the patient and coordination of care, physical exam, and documentation of the encounter.   ------------------------------------------------   Joyice Faster, PA-C   Billie Lade, PhD, MD  This document serves as a record of services personally performed by Antony Blackbird, MD. It was created on his behalf by Neena Rhymes, a trained medical scribe. The creation of this record is based on the scribe's personal observations and the provider's statements to them. This document has been checked and approved by the attending provider.

## 2023-06-02 DIAGNOSIS — J9601 Acute respiratory failure with hypoxia: Secondary | ICD-10-CM

## 2023-06-02 DIAGNOSIS — C3491 Malignant neoplasm of unspecified part of right bronchus or lung: Secondary | ICD-10-CM

## 2023-06-02 DIAGNOSIS — Z7189 Other specified counseling: Secondary | ICD-10-CM

## 2023-06-02 DIAGNOSIS — Z515 Encounter for palliative care: Secondary | ICD-10-CM

## 2023-06-02 DIAGNOSIS — Z79899 Other long term (current) drug therapy: Secondary | ICD-10-CM

## 2023-06-02 DIAGNOSIS — J9621 Acute and chronic respiratory failure with hypoxia: Secondary | ICD-10-CM | POA: Diagnosis not present

## 2023-06-02 DIAGNOSIS — J441 Chronic obstructive pulmonary disease with (acute) exacerbation: Principal | ICD-10-CM

## 2023-06-02 LAB — CBC
HCT: 37 % — ABNORMAL LOW (ref 39.0–52.0)
Hemoglobin: 11.8 g/dL — ABNORMAL LOW (ref 13.0–17.0)
MCH: 28.4 pg (ref 26.0–34.0)
MCHC: 31.9 g/dL (ref 30.0–36.0)
MCV: 89.2 fL (ref 80.0–100.0)
Platelets: 161 10*3/uL (ref 150–400)
RBC: 4.15 MIL/uL — ABNORMAL LOW (ref 4.22–5.81)
RDW: 19.6 % — ABNORMAL HIGH (ref 11.5–15.5)
WBC: 12.7 10*3/uL — ABNORMAL HIGH (ref 4.0–10.5)
nRBC: 0 % (ref 0.0–0.2)

## 2023-06-02 LAB — GLUCOSE, CAPILLARY
Glucose-Capillary: 210 mg/dL — ABNORMAL HIGH (ref 70–99)
Glucose-Capillary: 193 mg/dL — ABNORMAL HIGH (ref 70–99)
Glucose-Capillary: 227 mg/dL — ABNORMAL HIGH (ref 70–99)
Glucose-Capillary: 197 mg/dL — ABNORMAL HIGH (ref 70–99)

## 2023-06-02 LAB — BASIC METABOLIC PANEL
Anion gap: 10 (ref 5–15)
BUN: 34 mg/dL — ABNORMAL HIGH (ref 8–23)
CO2: 24 mmol/L (ref 22–32)
Calcium: 8.7 mg/dL — ABNORMAL LOW (ref 8.9–10.3)
Chloride: 105 mmol/L (ref 98–111)
Creatinine, Ser: 1.11 mg/dL (ref 0.61–1.24)
GFR, Estimated: >60 mL/min (ref >60)
Glucose, Bld: 213 mg/dL — ABNORMAL HIGH (ref 70–99)
Potassium: 4.2 mmol/L (ref 3.5–5.1)
Sodium: 139 mmol/L (ref 135–145)

## 2023-06-02 LAB — MAGNESIUM: Magnesium: 1.8 mg/dL (ref 1.7–2.4)

## 2023-06-02 LAB — CULTURE, BLOOD (ROUTINE X 2): Culture: NO GROWTH

## 2023-06-02 MED ORDER — GABAPENTIN 300 MG PO CAPS
300.0000 mg | ORAL_CAPSULE | Freq: Three times a day (TID) | ORAL | Status: DC
  Administered 2023-06-03 – 2023-06-06 (×8): 300 mg via ORAL
  Filled 2023-06-02 (×9): qty 1

## 2023-06-02 MED ORDER — MAGNESIUM SULFATE 2 GM/50ML IV SOLN
2.0000 g | Freq: Once | INTRAVENOUS | Status: AC
  Administered 2023-06-02: 2 g via INTRAVENOUS
  Filled 2023-06-02: qty 50

## 2023-06-02 MED ORDER — METHYLPREDNISOLONE SODIUM SUCC 40 MG IJ SOLR
40.0000 mg | Freq: Every day | INTRAMUSCULAR | Status: DC
  Administered 2023-06-02 – 2023-06-03 (×2): 40 mg via INTRAVENOUS
  Filled 2023-06-02 (×2): qty 1

## 2023-06-02 MED ORDER — FUROSEMIDE 10 MG/ML IJ SOLN
40.0000 mg | Freq: Once | INTRAMUSCULAR | Status: AC
  Administered 2023-06-02: 40 mg via INTRAVENOUS
  Filled 2023-06-02: qty 4

## 2023-06-02 NOTE — Progress Notes (Signed)
   06/01/23 2330  BiPAP/CPAP/SIPAP  BiPAP/CPAP/SIPAP Pt Type Adult  BiPAP/CPAP/SIPAP V60  Mask Type Full face mask  Mask Size Medium  Set Rate 10 breaths/min  Respiratory Rate 24 breaths/min  IPAP 10 cmH20  EPAP 5 cmH2O  FiO2 (%) 40 %  Minute Ventilation 9.3  Leak 33  Peak Inspiratory Pressure (PIP) 10  Tidal Volume (Vt) 385  Patient Home Equipment No  Auto Titrate No  Press High Alarm 25 cmH2O  Press Low Alarm 5 cmH2O  CPAP/SIPAP surface wiped down Yes

## 2023-06-02 NOTE — Progress Notes (Signed)
   06/02/23 0100  BiPAP/CPAP/SIPAP  $ Non-Invasive Ventilator  Non-Invasive Vent Subsequent  BiPAP/CPAP /SiPAP Vitals  Pulse Rate 90  Resp 20  BP (!) 98/59  SpO2 95 %  MEWS Score/Color  MEWS Score 1  MEWS Score Color Daryl [facility]ega

## 2023-06-02 NOTE — Plan of Care (Signed)

## 2023-06-02 NOTE — Progress Notes (Signed)
Pt not on BIPAP at this time.  

## 2023-06-02 NOTE — Progress Notes (Signed)
PROGRESS NOTE    Daryl [facility]ega  ZOX:[id number] DOB: [birth year] DOA: 05/31/2023 PCP: Loyola Mast, NP    Brief Narrative:  Daryl [facility]ega  is a 77 y.o. male reformed smoker with past medical history relevant for history of prior PE, recurrent lung cancer, melanoma, DM2, HLD, chronic hypoxic respiratory failure on at least 5 L of oxygen via nasal cannula at home presents by EMS on 05/31/2023 with worsening respiratory failure and increased oxygen requirement -CT angiogram chest in December 2023 showed radiation changes at the right apex with soft tissue density around the right mainstem bronchus and invading into carina through tracheal wall.  He had bronchoscopy in December 2023 but Rt main bronchus was narrowed immediately after takeoff, the narrowing could not be traversed.  Repeat CT angio chest on 05/31/2023 shows Rt paratracheal and hilar mass extending into the superior and anterior aspect of Rt PA progressive tumor thrombus invading the Rt main PA.  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 Clarita Leber to [facility] as patient will need radiation oncology input-- consult placed   acute COPD exacerbation--- in the setting of #1  above -Azithromycin/Rocephin, bronchodilators and mucolytics as well as IV Solu-Medrol as ordered -PCCM following   acute on chronic hypoxic respiratory failure--due to #1 and #2 above ---Management as above    Social/Ethics--- plan of care and advanced directive discussed with patient and son Jamonie Mense [facility]  -Patient request DNR/DNI status, -Otherwise no limitations to treatment -Palliative care consult requested to help further delineate goals of care   DM2-recent A1c 5.9 -Hold glipizide and metformin -SSI   chronic anemia---multi- factorial in the setting of underlying malignancy/chronic illness as well as iron deficiency - -stable, continue iron supplementation    history of PE (September 2016 )--- patient with underlying malignancy -High risk for DVT and PE---CTA chest on 05/31/2023 without acute PE at this time -Please see PCM transfer request note from Dr. Craige Cotta recommending against full anticoagulation at this time -May use Lovenox prophylactically at this time       DVT prophylaxis: enoxaparin (LOVENOX) injection 40 mg Start: 05/31/23 2200 SCDs Start: 05/31/23 1352 Place TED hose Start: 05/31/23 1352    Code Status: DNR   Disposition Plan:  Level of care: Stepdown Status is: Inpatient Remains inpatient appropriate    Consultants:  PCCM Rad onc Pallaitive care   Subjective: Sleepy this AM  Objective: Vitals:   06/02/23 0700 06/02/23 0745 06/02/23 0800 06/02/23 0900  BP: 125/76  132/79 124/81  Pulse: 93 93 96 95  Resp: (!) 0 16 16 (!) 2  Temp:  98.2 F (36.8 C)    TempSrc:  Axillary    SpO2: 97% 98% 96% 95%  Weight:      Height:        Intake/Output Summary (Last  24 hours) at 06/02/2023 1223 Last data filed at 06/02/2023 1100 Gross per 24 hour  Intake 1200.03 ml  Output 3500 ml  Net -2299.97 ml   Filed Weights   05/31/23 0923 05/31/23 1730  Weight: 77.1 kg 68.8 kg    Examination:   General: Appearance:    Well developed, well  nourished male in no acute distress     Lungs:     respirations unlabored, diminished on [place]  Heart:    Normal heart rate.    MS:   All extremities are intact.    Neurologic:   Will awken, does not interact       Data Reviewed: I have personally reviewed following labs and imaging studies  CBC: Recent Labs  Lab 05/31/23 1016 06/01/23 0323 06/02/23 0311  WBC 11.7* 4.1 12.7*  NEUTROABS 9.0*  --   --   HGB 12.7* 11.3* 11.8*  HCT 40.9 35.8* 37.0*  MCV 91.7 90.6 89.2  PLT 186 170 161   Basic Metabolic Panel: Recent Labs  Lab 05/31/23 1016 06/01/23 0323 06/02/23 0307 06/02/23 0311  NA 138 139  --  139  K 4.3 4.9  --  4.2  CL 106 105  --  105  CO2 22 24  --  24  GLUCOSE 90 220*  --  213*  BUN 34* 34*  --  34*  CREATININE 1.16 1.21  --  1.11  CALCIUM 9.5 9.0  --  8.7*  MG  --   --  1.8  --    GFR: Estimated Creatinine Clearance: 56 mL/min (by C-G formula based on SCr of 1.11 mg/dL). Liver Function Tests: No results for input(s): "AST", "ALT", "ALKPHOS", "BILITOT", "PROT", "ALBUMIN" in the last 168 hours. No results for input(s): "LIPASE", "AMYLASE" in the last 168 hours. No results for input(s): "AMMONIA" in the last 168 hours. Coagulation Profile: No results for input(s): "INR", "PROTIME" in the last 168 hours. Cardiac Enzymes: No results for input(s): "CKTOTAL", "CKMB", "CKMBINDEX", "TROPONINI" in the last 168 hours. BNP (last 3 results) No results for input(s): "PROBNP" in the last 8760 hours. HbA1C: No results for input(s): "HGBA1C" in the last 72 hours. CBG: Recent Labs  Lab 06/01/23 1236 06/01/23 1631 06/01/23 2106 06/02/23 0727 06/02/23 1140  GLUCAP 193* 206* 181* 210* 193*   Lipid Profile: No results for input(s): "CHOL", "HDL", "LDLCALC", "TRIG", "CHOLHDL", "LDLDIRECT" in the last 72 hours. Thyroid Function Tests: No results for input(s): "TSH", "T4TOTAL", "FREET4", "T3FREE", "THYROIDAB" in the last 72 hours. Anemia Panel: No results for  input(s): "VITAMINB12", "FOLATE", "FERRITIN", "TIBC", "IRON", "RETICCTPCT" in the last 72 hours. Sepsis Labs: Recent Labs  Lab 05/31/23 1017 05/31/23 1123  LATICACIDVEN 2.6* 2.6*    Recent Results (from the past 240 hour(s))  SARS Coronavirus 2 by RT PCR ([facility] order, performed in [facility] lab) *cepheid single result test* Anterior Nasal Swab     Status: None   Collection Time: 05/31/23  9:53 AM   Specimen: Anterior Nasal Swab  Result Value Ref Range Status   SARS Coronavirus 2 by RT PCR NEGATIVE NEGATIVE Final    Comment: (NOTE) SARS-CoV-2 target nucleic acids are NOT DETECTED.  The SARS-CoV-2 RNA is generally detectable in upper and lower respiratory specimens during the acute phase of infection. The lowest concentration of SARS-CoV-2 viral copies this assay can detect is 250 copies / mL. A negative result does not preclude SARS-CoV-2 infection and should not be used as the sole basis for treatment or other patient  management decisions.  A negative result may occur with improper specimen collection / handling, submission of specimen other than nasopharyngeal swab, presence of viral mutation(s) within the areas targeted by this assay, and inadequate number of viral copies (<250 copies / mL). A negative result must be combined with clinical observations, patient history, and epidemiological information.  Fact Sheet for Patients:   RoadLapTop.co.za  Fact Sheet for Healthcare Providers: http://kim-miller.com/  This test is not yet approved or  cleared by the [place] FDA and has been authorized for detection and/or diagnosis of SARS-CoV-2 by FDA under an Emergency Use Authorization (EUA).  This EUA will remain in effect (meaning this test can be used) for the duration of the COVID-19 declaration under Section 564(b)(1) of the Act, 21 U.S.C. section 360bbb-3(b)(1), unless the authorization is terminated or revoked  sooner.  Performed at [facility], [address], [place], [place] [address]   Culture, blood (routine x 2)     Status: None (Preliminary result)   Collection Time: 05/31/23 10:16 AM   Specimen: Right Antecubital; Blood  Result Value Ref Range Status   Specimen Description   Final    RIGHT ANTECUBITAL BOTTLES DRAWN AEROBIC AND ANAEROBIC   Special Requests Blood Culture adequate volume  Final   Culture   Final    NO GROWTH 2 DAYS Performed at [facility], [address] [place], [place] [address]    Report Status PENDING  Incomplete  Culture, blood (routine x 2)     Status: None (Preliminary result)   Collection Time: 05/31/23 10:17 AM   Specimen: BLOOD RIGHT HAND  Result Value Ref Range Status   Specimen Description   Final    BLOOD RIGHT HAND BOTTLES DRAWN AEROBIC AND ANAEROBIC   Special Requests Blood Culture adequate volume  Final   Culture   Final    NO GROWTH 2 DAYS Performed at [facility], [address], [place], [place] [address]    Report Status PENDING  Incomplete  MRSA Next Gen by PCR, Nasal     Status: None   Collection Time: 06/01/23 10:23 AM   Specimen: Nasal Mucosa; Nasal Swab  Result Value Ref Range Status   MRSA by PCR Next Gen NOT DETECTED NOT DETECTED Final    Comment: (NOTE) The GeneXpert MRSA Assay (FDA approved for NASAL specimens only), is one component of a comprehensive MRSA colonization surveillance program. It is not intended to diagnose MRSA infection nor to guide or monitor treatment for MRSA infections. Test performance is not FDA approved in patients less than 59 years old. Performed at [facility], [address] [place], [place] [address]          Radiology Studies: ECHOCARDIOGRAM LIMITED  Result Date: 05/31/2023    ECHOCARDIOGRAM REPORT   Patient Name:   AJIT YOHEY Tonche Date of Exam: 05/31/2023 Medical Rec #:  [id number]               Height:       69.0 in Accession #:    [id number]              Weight:        170.0 lb Date of Birth:  1946-07-03               BSA:          1.928 m Patient Age:    76 years                BP:  104/66 mmHg Patient Gender: M                       HR:           121 bpm. Exam [facility] Procedure: Limited Echo, Limited Color Doppler and Cardiac Doppler Indications:    Dyspnea R06.00  History:        Patient has prior history of Echocardiogram examinations, most                 recent 05/03/2020. COPD; Risk Factors:Diabetes, Dyslipidemia and                 Former Smoker. Hx of lung cancer.  Sonographer:    Celesta Gentile RCS Referring Phys: [phone] [facility]  Sonographer Comments: No parasternal window and suboptimal apical window. EXTREMELY difficult echo. IMPRESSIONS  1. "B" lines noted on chest ultrasound, suggestive of pulmonary edema.  2. Left ventricular ejection fraction, by estimation, is 25%. The left ventricle has severely decreased function. The left ventricle demonstrates global hypokinesis. Left ventricular diastolic parameters are indeterminate.  3. Right ventricular systolic function is severely reduced. The right ventricular size is moderately enlarged. There is normal pulmonary artery systolic pressure. The estimated right ventricular systolic pressure is 29.3 mmHg.  4. Left atrial size was mildly dilated.  5. Right atrial size was severely dilated.  6. A small pericardial effusion is present. The pericardial effusion is surrounding the apex.  7. The mitral valve is degenerative. No evidence of mitral valve regurgitation.  8. The aortic valve is grossly normal. Aortic valve regurgitation is not visualized. No aortic stenosis is present.  9. The inferior vena cava is dilated in size with <50% respiratory variability, suggesting right atrial pressure of 15 mmHg. FINDINGS  Left Ventricle: Left ventricular ejection fraction, by estimation, is 25%. The left ventricle has severely decreased function. The left ventricle demonstrates global hypokinesis. The  left ventricular internal cavity size was normal in size. Suboptimal image quality limits for assessment of left ventricular hypertrophy. Left ventricular diastolic parameters are indeterminate. Right Ventricle: The right ventricular size is moderately enlarged. Right vetricular wall thickness was not well visualized. Right ventricular systolic function is severely reduced. There is normal pulmonary artery systolic pressure. The tricuspid regurgitant velocity is 1.89 m/s, and with an assumed right atrial pressure of 15 mmHg, the estimated right ventricular systolic pressure is 29.3 mmHg. Left Atrium: Left atrial size was mildly dilated. Right Atrium: Right atrial size was severely dilated. Pericardium: A small pericardial effusion is present. The pericardial effusion is surrounding the apex. Presence of epicardial fat layer. Mitral Valve: The mitral valve is degenerative in appearance. No evidence of mitral valve regurgitation. Tricuspid Valve: The tricuspid valve is grossly normal. Tricuspid valve regurgitation is mild. Aortic Valve: The aortic valve is grossly normal. Aortic valve regurgitation is not visualized. No aortic stenosis is present. Pulmonic Valve: The pulmonic valve was not well visualized. Pulmonic valve regurgitation is not visualized. Aorta: Aortic root could not be assessed. Venous: The inferior vena cava is dilated in size with less than 50% respiratory variability, suggesting right atrial pressure of 15 mmHg. IAS/Shunts: The atrial septum is grossly normal.  AORTIC VALVE LVOT Vmax:   68.40 cm/s LVOT Vmean:  49.300 cm/s LVOT VTI:    0.105 m MITRAL VALVE               TRICUSPID VALVE MV Area (PHT): 6.96 cm    TR Peak grad:  14.3 mmHg MV Decel Time: 109 msec    TR Vmax:        189.00 cm/s MV E velocity: 81.90 cm/s                            SHUNTS                            Systemic VTI: 0.10 m Weston Brass MD Electronically signed by Weston Brass MD Signature Date/Time: 05/31/2023/2:54:48 PM     Final         Scheduled Meds:  arformoterol  15 mcg Nebulization BID   budesonide (PULMICORT) nebulizer solution  0.25 mg Nebulization BID   Chlorhexidine Gluconate Cloth  6 each Topical Daily   dextromethorphan-guaiFENesin  1 tablet Oral BID   enoxaparin (LOVENOX) injection  40 mg Subcutaneous Q24H   ferrous sulfate  325 mg Oral Q1500   gabapentin  900 mg Oral TID   insulin aspart  0-5 Units Subcutaneous QHS   insulin aspart  0-6 Units Subcutaneous TID WC   methylPREDNISolone (SOLU-MEDROL) injection  40 mg Intravenous Daily   mirtazapine  7.5 mg Oral QHS   revefenacin  175 mcg Nebulization Daily   rosuvastatin  20 mg Oral QHS   sodium chloride flush  3 mL Intravenous Q12H   Continuous Infusions:  azithromycin Stopped (06/02/23 1054)   cefTRIAXone (ROCEPHIN)  IV Stopped (06/02/23 0950)     LOS: 2 days    Time spent: 45 minutes spent on chart review, discussion with nursing staff, consultants, updating family and interview/physical exam; more than 50% of that time was spent in counseling and/or coordination of care.    Joseph Art, DO [facility] Available via Epic secure chat 7am-7pm After these hours, please refer to coverage provider listed on [email or web address] 06/02/2023, 12:23 PM

## 2023-06-02 NOTE — Inpatient Diabetes Management (Signed)
Inpatient Diabetes Program Recommendations  AACE/ADA: New Consensus Statement on Inpatient Glycemic Control   Target Ranges:  Prepandial:   less than 140 mg/dL      Peak postprandial:   less than 180 mg/dL (1-2 hours)      Critically ill patients:  140 - 180 mg/dL    Latest Reference Range & Units 06/02/23 03:11  Glucose 70 - 99 mg/dL 161 (H)    Latest Reference Range & Units 06/01/23 08:10 06/01/23 12:36 06/01/23 16:31 06/01/23 21:06  Glucose-Capillary 70 - 99 mg/dL 096 (H) 045 (H) 409 (H) 181 (H)    Review of Glycemic Control  Diabetes history: DM2 Outpatient Diabetes medications: Glipizide 10 mg BID, Metformin 500 mg TID Current orders for Inpatient glycemic control: Novolog 0-6 units TID with meals, Novolog 0-5 units at bedtime; Solumedrol 40 mg Q12H  Inpatient Diabetes Program Recommendations:    Insulin: If steroids are continued, may want to consider increasing Novolog correction to 0-9 units TID with meals.  Thanks, Orlando Penner, RN, MSN, CDCES Diabetes Coordinator Inpatient Diabetes Program [phone] (Team Pager from 8am to 5pm)'

## 2023-06-02 NOTE — Progress Notes (Signed)
NAME:  Daryl Vega, MRN:  [id number], DOB:  1946-10-18, LOS: 2 ADMISSION DATE:  05/31/2023, CONSULTATION DATE:  05/31/2023 REFERRING MD:  Dr. Mariea Clonts, CHIEF COMPLAINT:  short of breath   History of Present Illness:  77 yo male with COPD, chronic respiratory failure and lung cancer followed by Dr. Vassie Loll.  He was in hospital in December 2023 with dyspnea and and hypoxia CT angiogram chest showed radiation changes at the right apex with soft tissue density around the right mainstem bronchus and invading into carina through tracheal wall.  He had bronchoscopy in December 2023 but Rt main bronchus was narrowed immediately after takeoff, the narrowing could not be traversed.  He presented to [facility] with worsening dyspnea.  CT angio chest from 05/31/23 shows Rt paratracheal and hilar mass extending into the superior and anterior aspect of Rt PA progressive tumor thrombus invading the Rt main PA.  He was started on therapy for COPD exacerbation with Bipap, and transferred to [facility] for further management.  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 with intermittent use of BiPAP 6/18 on baseline 5 L after diuresis and ongoing treatment for COPD exacerbation  Interim History / Subjective:  Diuresis, oxygenation seems to improved.  Remains delirious.  Objective   BP 125/76   Pulse 93   Temp 98.2 F (36.8 C) (Axillary)   Resp 16   Ht 5\' 9"  (1.753 m)   Wt 68.8 kg   SpO2 98%   BMI 22.40 kg/m   FiO2 (%):  [40 %] 40 %   Intake/Output Summary (Last 24 hours) at 06/02/2023 4098 Last data filed at 06/02/2023 0200 Gross per 24 hour   Intake 1350 ml  Output 2900 ml  Net -1550 ml    Filed Weights   05/31/23 0923 05/31/23 1730  Weight: 77.1 kg 68.8 kg    Examination: General: Acute on chronically ill appearing elderly male sitting up in bed, in NAD HEENT: [place]/AT, MM pink/moist, PERRL,  Neuro: Alert and oriented to self only, delirious  CV: s1s2 regular rate and rhythm, no murmur, rubs, or gallops,  PULM: Diminished right upper side, no increased work of breathing, on 5 L nasal cannula GI: soft, bowel sounds active in all 4 quadrants, non-tender, non-distended Extremities: warm/dry, no edema  Skin: no rashes or lesions  Resolved Hospital Problem list     Assessment & Plan:   Acute on chronic hypoxemic respiratory failure: Worsening with volume overload and possible COPD exacerbation as well as worsening what appears to be tumor. -- Continue IV steroids, antibiotics (stop date entered) for COPD exacerbation --Once able to take PO recommend prednisone 40 mg for 5 days, 20 mg for 5 days, then 10 mg for daily thereafter until f/u in pulmonary office -- Recommend aggressive diuresis -- Nebulized bronchodilators, scheduled long-acting and short acting as needed -- Prognosis is extremely grim with baseline respiratory failure now with new cardiomyopathy.  Agree with ongoing goals of care.  He has essentially recurrent exacerbations and failure to thrive.  I think palliation of symptoms and consideration of palliative care versus hospice on discharge would be reasonable   Pulmonary edema, cardiomyopathy: -- Aggressive diuresis, goals of  care as above  Best Practice (right click and "Reselect all SmartList Selections" daily)   Diet/type: clear liquids DVT prophylaxis: LMWH GI prophylaxis: N/A Lines: N/A Foley:  N/A Code Status:  DNR Last date of multidisciplinary goals of care discussion  Pending   Signature:  Karren Burly, MD [facility] Personal contact information can be found  on Amion  If no contact or response made please call [phone] 06/02/2023, 9:09 AM

## 2023-06-02 NOTE — Progress Notes (Signed)
   06/01/23 2330  BiPAP/CPAP/SIPAP  BiPAP/CPAP/SIPAP Pt Type Adult  BiPAP/CPAP/SIPAP V60  Mask Type Full face mask  Mask Size Medium  Set Rate 10 breaths/min  Respiratory Rate 24 breaths/min  IPAP 10 cmH20  EPAP 5 cmH2O  FiO2 (%) 40 %  Minute Ventilation 9.3  Leak 33  Peak Inspiratory Pressure (PIP) 10  Tidal Volume (Vt) 385  Patient Home Equipment No  Auto Titrate No  Press High Alarm 25 cmH2O  Press Low Alarm 5 cmH2O  CPAP/SIPAP surface wiped down Yes   Pt. replaced on BiPAP, tolerating well, RT to monitor.

## 2023-06-02 NOTE — Progress Notes (Signed)
Daily Progress Note   Patient Name: Daryl [facility]       Date: 06/02/2023 DOB: [birth year]  Age: 77 y.o. MRN#: [id number] Attending Physician: Joseph Art, DO Primary Care Physician: Loyola Mast, NP Admit Date: 05/31/2023 Length of Stay: 2 days  Reason for Consultation/Follow-up: Establishing goals of care  Subjective:   CC: Patient sleeping when presented at bedside. Spoke with RN who noted continued lethargy and confusion. No family present at bedside at time of visit.   Subjective:  Reviewed EMR prior to presenting to bedside. At time of EMR review, patient has receive IV fentanyl x 3 doses. Also received tramadol 50mg  last night and total of Ativan 0.75mg  in past 24 hours.   Discussed care with bedside RN prior to seeing patient. Patient has been lethargic and not able to engage in conversation today. Plan is for rad onc to follow up with patient today to discuss plans for medical care moving forward. Patient's son had expressed wishes for this conversation yesterday. No family present at bedside when visiting this morning. Asked RN to reach out once family present at bedside. Also hopeful rad onc will have had the chance to weigh in on therapies being offered.   Review of Systems Sleeping comfortably Objective:   Vital Signs:  BP 125/76   Pulse 93   Temp 98.2 F (36.8 C) (Axillary)   Resp 16   Ht 5\' 9"  (1.753 m)   Wt 68.8 kg   SpO2 98%   BMI 22.40 kg/m   Physical Exam: General: chronically ill appearing, sleeping  Eyes: no discharge noted HENT: dry mucous membranes Cardiovascular: RRR Respiratory: no increased work of breathing noted, not in respiratory distress Abdomen: not distended  Imaging:  I personally reviewed recent imaging.   Assessment & Plan:   Assessment: Patient is a 77 year old with COPD chronic respiratory failure and lung cancer who was transferred from [facility].  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 Patient lives by himself with his dogs.  At baseline, he is able to use a walker to do his own activities of daily living however son describes that patient is mostly sedentary. 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  Patient has right upper lobe radiation fibrosis and concern for reoccurrence.  Has chronic loculated right pleural effusion as well.  Had a high O2 requirements initially required BiPAP now on 10 L supplemental oxygen via nasal cannula Is on bronchodilators, IV Solu-Medrol, empiric antibiotics. Radiation oncology has been consulted and patient is considering XRT short course. Palliative consult for goals of care discussions.  Recommendations/Plan: # Complex medical decision making/goals of care:  Discussed care with bedside RN today as no family present with patient when visiting. Patient has continued to be lethargic and have episodes of confusion. Conversation regarding rad onc have been deferred by son until patient can participate though concerned that if patient can't participate in conversation, participating in actual radiation therapies even for palliative measures may not be a feasible option. PMT will continue to follow and participate in complex medical decision making as able.   -  Code Status: DNR  # Symptom management: Non-pharmacologic Delirium Precautions  - Frequent re-orientation; update board w/ date, names of treatment team  - Daytime stimulation: Open curtains, turn  lights on, and turn on television as tolerated during waking hours  - Nighttime calm - close curtains, turn lights off, and turn off television at 9pm  with minimal interruptions from treatment team during sleeping hours, including avoiding  lab draws if possible  - Encourage continued presence of family/friends  - Occupy w/ distractions - e.g. ask them to fold washcloths, busy-board  - Encourage movement with PT/OT as tolerated  - Ensure adequate sensorium, to include providing glasses, dentures, and hearing aids to patient as appropriate  - Ensure adequate nutrition and fluid intake  - Avoid restraints whenever possible, but "safety first" . If restraints are necessary, please monitor CPK and BUN/Cr  - Assess and treat pain (incl nonverbal cues); e.g. consider scheduled tylenol  - Assess and treat constipation and urinary retention  - Ensure hydration, electrolyte balance (K to 4.0, Mg to 2.0), adequate oxygenation  - Review medications (addition, deletion, changes can trigger)   -Discontinued tramadol; do not restart. Can cause multiple adverse effects in this patient population.   # Psychosocial Support:  -Son and daughter listed in EMR  # Discharge Planning: To Be Determined  Discussed with: bedside RN  Thank you for allowing the palliative care team to participate in the care Daryl [facility].  Daryl Morin, DO Palliative Care Provider PMT # [phone]  If patient remains symptomatic despite maximum doses, please call PMT at [phone] between 0700 and 1900. Outside of these hours, please call attending, as PMT does not have night coverage.  This provider spent a total of 35 minutes providing patient's care.  Includes review of EMR, discussing care with other staff members involved in patient's medical care, obtaining relevant history and information from patient and/or patient's family, and personal review of imaging and lab work. Greater than 50% of the time was spent counseling and coordinating care related to the above assessment and plan.    *Please note that this is a verbal dictation therefore any spelling or grammatical errors are due to the "Dragon Medical One" system interpretation.

## 2023-06-03 DIAGNOSIS — Z66 Do not resuscitate: Secondary | ICD-10-CM

## 2023-06-03 DIAGNOSIS — J9621 Acute and chronic respiratory failure with hypoxia: Secondary | ICD-10-CM | POA: Diagnosis not present

## 2023-06-03 DIAGNOSIS — C3491 Malignant neoplasm of unspecified part of right bronchus or lung: Secondary | ICD-10-CM

## 2023-06-03 DIAGNOSIS — Z7189 Other specified counseling: Secondary | ICD-10-CM

## 2023-06-03 DIAGNOSIS — R918 Other nonspecific abnormal finding of lung field: Secondary | ICD-10-CM

## 2023-06-03 LAB — BASIC METABOLIC PANEL
Anion gap: 10 (ref 5–15)
BUN: 41 mg/dL — ABNORMAL HIGH (ref 8–23)
CO2: 26 mmol/L (ref 22–32)
Calcium: 8.5 mg/dL — ABNORMAL LOW (ref 8.9–10.3)
Chloride: 102 mmol/L (ref 98–111)
Creatinine, Ser: 1.11 mg/dL (ref 0.61–1.24)
GFR, Estimated: >60 mL/min (ref >60)
Glucose, Bld: 197 mg/dL — ABNORMAL HIGH (ref 70–99)
Potassium: 3.6 mmol/L (ref 3.5–5.1)
Sodium: 138 mmol/L (ref 135–145)

## 2023-06-03 LAB — GLUCOSE, CAPILLARY
Glucose-Capillary: 221 mg/dL — ABNORMAL HIGH (ref 70–99)
Glucose-Capillary: 196 mg/dL — ABNORMAL HIGH (ref 70–99)
Glucose-Capillary: 315 mg/dL — ABNORMAL HIGH (ref 70–99)
Glucose-Capillary: 271 mg/dL — ABNORMAL HIGH (ref 70–99)

## 2023-06-03 LAB — URINALYSIS, ROUTINE W REFLEX MICROSCOPIC
Bacteria, UA: NONE SEEN
Bilirubin Urine: NEGATIVE
Glucose, UA: NEGATIVE mg/dL
Ketones, ur: NEGATIVE mg/dL
Leukocytes,Ua: NEGATIVE
Nitrite: NEGATIVE
Protein, ur: 100 mg/dL — AB
Specific Gravity, Urine: 1.02 (ref 1.005–1.030)
pH: 6 (ref 5.0–8.0)

## 2023-06-03 LAB — CULTURE, BLOOD (ROUTINE X 2): Special Requests: ADEQUATE

## 2023-06-03 MED ORDER — FUROSEMIDE 10 MG/ML IJ SOLN
40.0000 mg | Freq: Every day | INTRAMUSCULAR | Status: DC
  Administered 2023-06-03 – 2023-06-04 (×2): 40 mg via INTRAVENOUS
  Filled 2023-06-03 (×2): qty 4

## 2023-06-03 MED ORDER — LORAZEPAM 2 MG/ML IJ SOLN
0.5000 mg | Freq: Once | INTRAMUSCULAR | Status: AC
  Administered 2023-06-03: 0.5 mg via INTRAVENOUS
  Filled 2023-06-03: qty 1

## 2023-06-03 MED ORDER — PREDNISONE 20 MG PO TABS
40.0000 mg | ORAL_TABLET | Freq: Every day | ORAL | Status: DC
  Administered 2023-06-04 – 2023-06-05 (×2): 40 mg via ORAL
  Filled 2023-06-03 (×2): qty 2

## 2023-06-03 MED ORDER — CHLORHEXIDINE GLUCONATE CLOTH 2 % EX PADS
6.0000 | MEDICATED_PAD | Freq: Every day | CUTANEOUS | Status: DC
  Administered 2023-06-03 – 2023-06-05 (×2): 6 via TOPICAL

## 2023-06-03 NOTE — Progress Notes (Signed)
   06/03/23 2325  BiPAP/CPAP/SIPAP  $ Face Mask Medium Yes  BiPAP/CPAP/SIPAP Pt Type Adult  Mask Type Full face mask  Mask Size Medium  Set Rate 10 breaths/min  Respiratory Rate 20 breaths/min  IPAP 10 cmH20  EPAP 5 cmH2O  FiO2 (%) (S)  35 % (titrated per order)  Minute Ventilation 14  Leak 38  Peak Inspiratory Pressure (PIP) 10  Tidal Volume (Vt) 623  Patient Home Equipment No  Auto Titrate No  Press High Alarm 25 cmH2O  Press Low Alarm 5 cmH2O  CPAP/SIPAP surface wiped down Yes   Pt. Placed on BiPAP for h/s/PRN use, toleration fair, RN aware, RT to monitor.

## 2023-06-03 NOTE — Progress Notes (Signed)
    Patient Name: Aroldo Rokusek [facility]           DOB: Aug 13, 1946  MRN: [id number]      Admission Date: 05/31/2023  Attending Provider: Joseph Art, DO  Primary Diagnosis: Acute on chronic hypoxic respiratory failure [facility])   Level of care: Stepdown    CROSS COVER NOTE   Date of Service   06/03/2023   Helayne Seminole Herberg, 77 y.o. male, was admitted on 05/31/2023 for Acute on chronic hypoxic respiratory failure (HCC).    HPI/Events of Note   Notified by nursing staff that patient is increasingly agitated and confused. Appears to have intermittent agitation since admission.    Patient has removed medical equipment including oxygen supply.  He easily desats without O2.  Unfortunately, he is not following commands despite multiple attempts at redirection by staff.   For the safety of the patient, wrist restraint has been placed. Restraint to be discontinued as soon as patient is able to safely have them removed.    Interventions/ Plan   Restraints use Additional Ativan         Anthoney Harada, DNP, ACNPC- AG [facility] [place]

## 2023-06-03 NOTE — Progress Notes (Signed)
Patient in BIPAP, this RN took out the soft wrist restraints but the patient is agitated that he keep on taking off the BIPAP.

## 2023-06-03 NOTE — TOC Progression Note (Signed)
Transition of Care [facility]) - Progression Note    Patient Details  Name: Daryl [facility] MRN: [id number] Date of Birth: May 02, 1946  [facility]) CM/SW Contact  Larrie Kass, LCSW Phone Number: 06/03/2023, 4:20 PM  Clinical Narrative:    CSW spoke with pt's son Freida Busman to provide Choice of Hospice agency. Pt 's son has chosen Company secretary. CSW spoke with intake to make referral. TOC to follow.    Expected Discharge Plan: Home w Hospice Care Barriers to Discharge: Continued Medical Work up  Expected Discharge Plan and Services       Living arrangements for the past 2 months: Single Family Home                                       Social Determinants of Health (SDOH) Interventions SDOH Screenings   Food Insecurity: Patient Unable To Answer (05/31/2023)  Housing: High Risk (05/31/2023)  Transportation Needs: Patient Unable To Answer (05/31/2023)  Utilities: Patient Unable To Answer (05/31/2023)  Tobacco Use: Medium Risk (05/31/2023)    Readmission Risk Interventions     No data to display

## 2023-06-03 NOTE — Inpatient Diabetes Management (Signed)
Inpatient Diabetes Program Recommendations  AACE/ADA: New Consensus Statement on Inpatient Glycemic Control (2015)  Target Ranges:  Prepandial:   less than 140 mg/dL      Peak postprandial:   less than 180 mg/dL (1-2 hours)      Critically ill patients:  140 - 180 mg/dL   Lab Results  Component Value Date   GLUCAP 221 (H) 06/03/2023   HGBA1C 7.1 (H) 02/08/2023    Review of Glycemic Control  Latest Reference Range & Units 06/02/23 07:27 06/02/23 11:40 06/02/23 16:07 06/02/23 21:33 06/03/23 07:37  Glucose-Capillary 70 - 99 mg/dL 161 (H) 096 (H) 045 (H) 197 (H) 221 (H)  (H): Data is abnormally high  Diabetes history: DM2 Outpatient Diabetes medications: Glipizide 10 mg BID, Metformin 500 mg TID Current orders for Inpatient glycemic control: Novolog 0-6 units TID with meals, Novolog 0-5 units at bedtime; Solumedrol 40 mg Q12H   Inpatient Diabetes Program Recommendations:     Insulin: If steroids are continued, may want to consider increasing Novolog correction to 0-9 units TID with meals.  Will continue to follow while inpatient.  Thank you, Dulce Sellar, MSN, CDCES Diabetes Coordinator Inpatient Diabetes Program [phone] (team pager from 8a-5p)

## 2023-06-03 NOTE — Progress Notes (Signed)
Daily Progress Note   Patient Name: Daryl [facility]       Date: 06/03/2023 DOB: 09/25/1946  Age: 77 y.o. MRN#: [id number] Attending Physician: Briant Cedar, MD Primary Care Physician: Loyola Mast, NP Admit Date: 05/31/2023 Length of Stay: 3 days  Reason for Consultation/Follow-up: Establishing goals of care  Subjective:   CC: Patient remains agitated and confused. Discussed care with son, Daryl [facility], at bedside. Following up regarding complex medical decision making.   Subjective:  Reviewed EMR. Planned for family meeting this afternoon with patient's son. Discussed care with bedside RN throughout day for updates.  Also discussed care with hospitalist and radiation oncology to coordinate goal information for family meeting this afternoon.  Presented to bedside in afternoon once informed son present.  Introduced myself as a member of the palliative medicine team.  Out conversation with son at bedside, patient remained confused and agitated.  Sometimes patient yelling out and swinging arms violently.  Patient not able to participate in complex medical decision making.   Able to discuss patient's medical journey up until this point.  Son has heard throughout discussions with oncology and radiation oncology that patient is essentially "damned if he he does not damned if he does not".  Son does not want to risk patient undergoing radiation just to cause him to bleed faster.  Patient's son noted that patient has been very clear that he is DNR and would not want aggressive medical interventions knowing it would not lead to quality outcomes.  Son also able to state that patient would never want to die here in the hospital.  With this in mind, able to discuss pathways for medical care moving forward.  Realistically with patient's continued delirium, minimal oral intake, and worsening agitation, would consider transition to comfort focused care and patient returning home with hospice.  Spent time  answering questions as able regarding this.  Son willing to discuss support that can be provided by hospice at home for patient.  Son did note that he is patient's primary caregiver at this point.  Patient's stepdaughter is no longer involved in patient's care.  Son did note there would be other family members who could help though he would be the main support.  Noted would reach out to social worker to provide son with list of possible hospice agency so he could further discuss with their liaison.  Noted would also inform radiation oncology that radiation is not being pursued.  Spent time answering questions as able.  Noted palliative medicine team will continue to follow with patient's medical journey.  Review of Systems Agitated, confused Objective:   Vital Signs:  BP (!) 140/91 (BP Location: Left Arm)   Pulse 97   Temp 97.6 F (36.4 C) (Oral)   Resp 18   Ht 5\' 9"  (1.753 m)   Wt 68.8 kg   SpO2 99%   BMI 22.40 kg/m   Physical Exam: General: chronically ill appearing, agitated, confused, cachectic Eyes: no discharge noted HENT: dry mucous membranes Cardiovascular: Tachycardia noted Respiratory: no increased work of breathing noted, not in respiratory distress on nasal cannula support Abdomen: not distended Psychiatric: Agitated in mittens  Imaging:  I personally reviewed recent imaging.   Assessment & Plan:   Assessment: Patient is a 77 year old with COPD chronic respiratory failure and lung cancer who was transferred from [facility].  Has underlying history of anemia duodenal ulcer stage IIIb non-small cell lung cancer diagnosed August 2015, status post palliative radiation to right upper lobe  followed by chemotherapy. Patient lives by himself with his dogs.  At baseline, he is able to use a walker to do his own activities of daily living however son describes that patient is mostly sedentary. 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  Patient has right upper lobe radiation fibrosis and concern for reoccurrence.  Has chronic loculated right pleural effusion as well.  Had a high O2 requirements initially required BiPAP now on 10 L supplemental oxygen via nasal cannula Is on bronchodilators, IV Solu-Medrol, empiric antibiotics. Radiation oncology has been consulted and patient is considering XRT short course. Palliative consult for goals of care discussions.  Recommendations/Plan: # Complex medical decision making/goals of care:  - Patient unable to participate in complex medical decision-making due to current medical status. - Spoke extensively with patient's son, Daryl [facility], at bedside as described in detail above in HPI.  Son does not want to put patient through radiation knowing risk that can be associated with it. Also patient not appropraite with worsening agitation.  Son can voice how critically ill the patient is due to his underlying comorbidities and cancer.  Son able to express that patient would not want aggressive medical interventions knowing it would not improve his quality of life and would not want to die in the hospital.  Discussed realistically option of patient returning home with hospice and 24/7 support from family.  Son willing to to speak with a hospice liaison regarding patient returning home with hospice.  Consulted TOC to assist with coordination of care.  -  Code Status: DNR  # Symptom management: Non-pharmacologic Delirium Precautions  - Frequent re-orientation; update board w/ date, names of treatment team  - Daytime stimulation: Open curtains, turn lights on, and turn on television as tolerated during waking hours  - Nighttime calm - close curtains, turn lights off, and turn off television at 9pm  with minimal interruptions from treatment team during sleeping hours, including avoiding lab draws if possible  -  Encourage continued presence of family/friends  - Occupy w/ distractions - e.g. ask them to fold washcloths, busy-board  - Ensure adequate sensorium, to include providing glasses, dentures, and hearing aids to patient as appropriate  - Ensure adequate nutrition and fluid intake  - Avoid restraints whenever possible, but "safety first" . If restraints are necessary, please monitor CPK and BUN/Cr  - Assess and treat pain (incl nonverbal cues); e.g. consider scheduled tylenol  - Assess and treat constipation and urinary retention   # Psychosocial Support:  -Son   # Discharge Planning: Considering home with hospice support  Discussed with: bedside RN, son, TOC, hospitalist, rad onc   Thank you for allowing the palliative care team to participate in the care Daryl [facility].  Alvester Morin, DO Palliative Care Provider PMT # [phone]  If patient remains symptomatic despite maximum doses, please call PMT at [phone] between 0700 and 1900. Outside of these hours, please call attending, as PMT does not have night coverage.  This provider spent a total of 51 minutes providing patient's care.  Includes review of EMR, discussing care with other staff members involved in patient's medical care, obtaining relevant history and information from patient and/or patient's family, and personal review of imaging and lab work. Greater than 50% of the time was spent counseling and coordinating care related to the above assessment and plan.    *Please note that this is  a verbal dictation therefore any spelling or grammatical errors are due to the "Dragon Medical One" system interpretation.

## 2023-06-03 NOTE — TOC Initial Note (Signed)
Transition of Care [facility]) - Initial/Assessment Note    Patient Details  Name: Daryl [facility] MRN: [id number] Date of Birth: 1946/05/20  Transition of Care [facility]) CM/SW Contact:    Larrie Kass, LCSW Phone Number: 06/03/2023, 10:27 AM  Clinical Narrative:                 Pt with some confused, palliative following discussing GOC. TOC to follow for d/c needs.   Expected Discharge Plan:  (TBD) Barriers to Discharge: Continued Medical Work up   Patient Goals and CMS Choice            Expected Discharge Plan and Services                                              Prior Living Arrangements/Services                       Activities of Daily Living Home Assistive Devices/Equipment: Oxygen ADL Screening (condition at time of admission) Patient's cognitive ability adequate to safely complete daily activities?: Yes Is the patient deaf or have difficulty hearing?: No Does the patient have difficulty seeing, even when wearing glasses/contacts?: No Does the patient have difficulty concentrating, remembering, or making decisions?: Yes Patient able to express need for assistance with ADLs?: Yes Does the patient have difficulty dressing or bathing?: Yes Independently performs ADLs?: Yes (appropriate for developmental age) Does the patient have difficulty walking or climbing stairs?: Yes Weakness of Legs: Both Weakness of Arms/Hands: Both  Permission Sought/Granted                  Emotional Assessment              Admission diagnosis:  COPD exacerbation (HCC) [J44.1] Acute respiratory failure with hypoxia (HCC) [J96.01] Local recurrence of malignant neoplasm of right lung (HCC) [C34.91] Acute on chronic hypoxic respiratory failure (HCC) [J96.21] Patient Active Problem List   Diagnosis Date Noted   Palliative care encounter 06/02/2023   Goals of care, counseling/discussion 06/02/2023   Medication management 06/02/2023   COPD  exacerbation (HCC) 06/02/2023   Acute on chronic hypoxic respiratory failure (HCC) 05/31/2023   Parapneumonic effusion 02/20/2023   Malnutrition of moderate degree 02/11/2023   Chronic respiratory failure with hypoxia (HCC) 12/03/2022   Aspiration pneumonia (HCC) 05/02/2020   Chest pain in adult 05/02/2020   Hallucination 09/27/2015   Antineoplastic chemotherapy induced anemia 09/26/2015   Anemia of chronic disease 09/26/2015   Dyslipidemia associated with type 2 diabetes mellitus (HCC) 09/26/2015   Type 2 diabetes, controlled, 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HCC) 09/26/2015   Polypharmacy 09/25/2015   Recurrent falls 09/25/2015   Pulmonary embolism (HCC) 01/06/2015   Enteritis due to Clostridium difficile 12/19/2014   Protein-calorie malnutrition, severe (HCC) 11/30/2014   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 11/30/2014   Bleeding duodenal ulcer s/p ex lap/oversew 11/30/2014 11/30/2014   Acute respiratory failure with hypoxia (HCC)    Lung cancer (HCC) 07/28/2014   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 and BI 50% obst  07/20/2014   COPD mixed type (HCC) 06/28/2014   Community acquired pneumonia of right lower lobe of lung 06/27/2014   PCP:  Loyola Mast, NP Pharmacy:   [facility] [address], [place] - [address] [place] [address] Phone:  [phone] Fax: [phone]     Social Determinants of Health (SDOH) Social History: SDOH Screenings   Food Insecurity: Patient Unable To Answer (05/31/2023)  Housing: High Risk (05/31/2023)  Transportation Needs: Patient Unable To Answer (05/31/2023)  Utilities: Patient Unable To Answer (05/31/2023)  Tobacco Use: Medium Risk (05/31/2023)   SDOH Interventions:     Readmission Risk Interventions     No data to display

## 2023-06-03 NOTE — Progress Notes (Signed)
PROGRESS NOTE    [facility]  GNF:[id number] DOB: [birth year] DOA: 05/31/2023 PCP: Loyola Mast, NP    Brief Narrative:  Daryl Vega  is a 77 y.o. male with past medical history of prior PE, recurrent lung cancer, melanoma, DM2, HLD, chronic hypoxic respiratory failure on at least 5 L of oxygen via nasal cannula at home presents by EMS on 05/31/2023 with worsening respiratory failure and increased oxygen requirement. 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 Pt admitted for further management.   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  Also noted risk of massive bleeding with radiation Palliative consulted, son agreed for home with hospice Supplemental O2, BiPAP as needed  Possible acute COPD exacerbation In the setting of #1 above Completed azithromycin, continue Rocephin, prednisone, bronchodilators, mucolytics PCCM consulted  Acute on chronic systolic and diastolic HF BNP 8469 CT chest showing pulmonary edema Echo done with EF of 25%, global hypokinesis, small pericardial effusion Continue IV diuresis Due to overall poor prognosis, patient switching over to hospice, will hold off on cardiology consult for now  Acute metabolic encephalopathy Likely multifactorial, as noted above Patient noted to be agitated/altered, could also be progression of disease Requiring restraints, reassess frequently for removal Delirium precautions   DM2-recent A1c 5.9 Hold glipizide and  metformin SSI   Anemia of chronic disease chronic anemia Malignancy/chronic illness as well as iron deficiency Continue iron supplementation   History of PE (September 2016 )--- patient with underlying malignancy High risk for DVT and PE---CTA chest on 05/31/2023 without acute PE at this time Please see PCM transfer request note from Dr. Craige Cotta recommending against full anticoagulation at this time, due to risk of massive bleeding May use Lovenox prophylactically at this time       DVT prophylaxis: enoxaparin (LOVENOX) injection 40 mg Start: 05/31/23 2200 SCDs Start: 05/31/23 1352 Place TED hose Start: 05/31/23 1352    Code Status: DNR   Disposition Plan:  Level of care: Stepdown Status is: Inpatient Remains inpatient appropriate    Consultants:  PCCM Rad onc Pallaitive care   Subjective: Confused/agitated this a.m. requiring restraints Unable to communicate   Objective: Vitals:   06/03/23 1500 06/03/23 1600 06/03/23 1700 06/03/23 1800  BP: 129/75 (!) 135/92 132/88 138/86  Pulse: 99 99 (!) 102 (!) 106  Resp: (!) 29 (!) 28 (!) 27 (!) 30  Temp:  98.6 F (37 C)    TempSrc:  Oral    SpO2: 97% 95% 94% 95%  Weight:      Height:        Intake/Output Summary (Last 24 hours) at 06/03/2023 1835 Last data filed at 06/03/2023 1825 Gross per 24 hour  Intake 600.03 ml  Output 3250 ml  Net -2649.97 ml   Filed Weights   05/31/23 0923 05/31/23 1730  Weight: 77.1 kg 68.8 kg    Examination: General: Intermittent agitation, confused Cardiovascular: S1, S2 present Respiratory: Diminished breath sounds bilaterally Abdomen: Soft, nontender, nondistended, bowel sounds present Musculoskeletal: No bilateral pedal edema noted Skin: Normal Psychiatry: Unable to assess  Data Reviewed: I have personally reviewed following labs and imaging studies  CBC: Recent Labs  Lab 05/31/23 1016 06/01/23 0323 06/02/23 0311  WBC 11.7* 4.1 12.7*  NEUTROABS 9.0*  --   --    HGB 12.7* 11.3* 11.8*  HCT 40.9 35.8* 37.0*  MCV 91.7 90.6 89.2  PLT 186 170 161   Basic Metabolic Panel: Recent Labs  Lab 05/31/23 1016 06/01/23 0323 06/02/23 0307 06/02/23 0311 06/03/23 0814  NA 138 139  --  139 138  K 4.3 4.9  --  4.2 3.6  CL 106 105  --  105 102  CO2 22 24  --  24 26  GLUCOSE 90 220*  --  213* 197*  BUN 34* 34*  --  34* 41*  CREATININE 1.16 1.21  --  1.11 1.11  CALCIUM 9.5 9.0  --  8.7* 8.5*  MG  --   --  1.8  --   --    GFR: Estimated Creatinine Clearance: 56 mL/min (by C-G formula based on SCr of 1.11 mg/dL). Liver Function Tests: No results for input(s): "AST", "ALT", "ALKPHOS", "BILITOT", "PROT", "ALBUMIN" in the last 168 hours. No results for input(s): "LIPASE", "AMYLASE" in the last 168 hours. No results for input(s): "AMMONIA" in the last 168 hours. Coagulation Profile: No results for input(s): "INR", "PROTIME" in the last 168 hours. Cardiac Enzymes: No results for input(s): "CKTOTAL", "CKMB", "CKMBINDEX", "TROPONINI" in the last 168 hours. BNP (last 3 results) No results for input(s): "PROBNP" in the last 8760 hours. HbA1C: No results for input(s): "HGBA1C" in the last 72 hours. CBG: Recent Labs  Lab 06/02/23 1607 06/02/23 2133 06/03/23 0737 06/03/23 1128 06/03/23 1641  GLUCAP 227* 197* 221* 196* 315*   Lipid Profile: No results for input(s): "CHOL", "HDL", "LDLCALC", "TRIG", "CHOLHDL", "LDLDIRECT" in the last 72 hours. Thyroid Function Tests: No results for input(s): "TSH", "T4TOTAL", "FREET4", "T3FREE", "THYROIDAB" in the last 72 hours. Anemia Panel: No results for input(s): "VITAMINB12", "FOLATE", "FERRITIN", "TIBC", "IRON", "RETICCTPCT" in the last 72 hours. Sepsis Labs: Recent Labs  Lab 05/31/23 1017 05/31/23 1123  LATICACIDVEN 2.6* 2.6*    Recent Results (from the past 240 hour(s))  SARS Coronavirus 2 by RT PCR (hospital order, performed in [facility] hospital lab) *cepheid single result test* Anterior Nasal Swab      Status: None   Collection Time: 05/31/23  9:53 AM   Specimen: Anterior Nasal Swab  Result Value Ref Range Status   SARS Coronavirus 2 by RT PCR NEGATIVE NEGATIVE Final    Comment: (NOTE) SARS-CoV-2 target nucleic acids are NOT DETECTED.  The SARS-CoV-2 RNA is generally detectable in upper and lower respiratory specimens during the acute phase of infection. The lowest concentration of SARS-CoV-2 viral copies this assay can detect is 250 copies / mL. A negative result does not preclude SARS-CoV-2 infection and should not be used as the sole basis for treatment or other patient management decisions.  A negative result may occur with improper specimen collection / handling, submission of specimen other than nasopharyngeal swab, presence of viral mutation(s) within the areas targeted by this assay, and inadequate number of viral copies (<250 copies / mL). A negative result must be combined with clinical observations, patient history, and epidemiological information.  Fact Sheet for Patients:   RoadLapTop.co.za  Fact Sheet for Healthcare Providers: http://kim-miller.com/  This test is not yet approved or  cleared by the [place] FDA and has been authorized for detection and/or diagnosis of SARS-CoV-2 by FDA under  an Emergency Use Authorization (EUA).  This EUA will remain in effect (meaning this test can be used) for the duration of the COVID-19 declaration under Section 564(b)(1) of the Act, 21 U.S.C. section 360bbb-3(b)(1), unless the authorization is terminated or revoked sooner.  Performed at [facility], [address] [place], [place] [address]   Culture, blood (routine x 2)     Status: None (Preliminary result)   Collection Time: 05/31/23 10:16 AM   Specimen: Right Antecubital; Blood  Result Value Ref Range Status   Specimen Description   Final    RIGHT ANTECUBITAL BOTTLES DRAWN AEROBIC AND ANAEROBIC   Special Requests Blood  Culture adequate volume  Final   Culture   Final    NO GROWTH 3 DAYS Performed at [facility], [address] [place], [place] [address]    Report Status PENDING  Incomplete  Culture, blood (routine x 2)     Status: None (Preliminary result)   Collection Time: 05/31/23 10:17 AM   Specimen: BLOOD RIGHT HAND  Result Value Ref Range Status   Specimen Description   Final    BLOOD RIGHT HAND BOTTLES DRAWN AEROBIC AND ANAEROBIC   Special Requests Blood Culture adequate volume  Final   Culture   Final    NO GROWTH 3 DAYS Performed at [facility], [address] [place], [place] [address]    Report Status PENDING  Incomplete  MRSA Next Gen by PCR, Nasal     Status: None   Collection Time: 06/01/23 10:23 AM   Specimen: Nasal Mucosa; Nasal Swab  Result Value Ref Range Status   MRSA by PCR Next Gen NOT DETECTED NOT DETECTED Final    Comment: (NOTE) The GeneXpert MRSA Assay (FDA approved for NASAL specimens only), is one component of a comprehensive MRSA colonization surveillance program. It is not intended to diagnose MRSA infection nor to guide or monitor treatment for MRSA infections. Test performance is not FDA approved in patients less than 22 years old. Performed at [facility], [address], [place], [place] [address]          Radiology Studies: No results found.      Scheduled Meds:  arformoterol  15 mcg Nebulization BID   budesonide (PULMICORT) nebulizer solution  0.25 mg Nebulization BID   Chlorhexidine Gluconate Cloth  6 each Topical QHS   dextromethorphan-guaiFENesin  1 tablet Oral BID   enoxaparin (LOVENOX) injection  40 mg Subcutaneous Q24H   ferrous sulfate  325 mg Oral Q1500   furosemide  40 mg Intravenous Daily   gabapentin  300 mg Oral TID   insulin aspart  0-5 Units Subcutaneous QHS   insulin aspart  0-6 Units Subcutaneous TID WC   methylPREDNISolone (SOLU-MEDROL) injection  40 mg Intravenous Daily   mirtazapine  7.5 mg Oral QHS    revefenacin  175 mcg Nebulization Daily   rosuvastatin  20 mg Oral QHS   sodium chloride flush  3 mL Intravenous Q12H   Continuous Infusions:  cefTRIAXone (ROCEPHIN)  IV Stopped (06/03/23 1000)     LOS: 3 days     Briant Cedar, MD [facility] Available via Epic secure chat 7am-7pm After these hours, please refer to coverage provider listed on [email or web address] 06/03/2023, 6:35 PM

## 2023-06-03 NOTE — Evaluation (Signed)
Clinical/Bedside Swallow Evaluation Patient Details  Name: Daryl Vega MRN: [id number] Date of Birth: February 06, 1946  Today's Date: 06/03/2023 Time: SLP Start Time (ACUTE ONLY): 4098 SLP Stop Time (ACUTE ONLY): 0855 SLP Time Calculation (min) (ACUTE ONLY): 20 min  Past Medical History:  Past Medical History:  Diagnosis Date   Anemia    C. difficile enteritis    Cholecystoduodenal fistula    COPD (chronic obstructive pulmonary disease) (HCC)    DM (diabetes mellitus) (HCC)    Duodenal ulcer    Elevated LFTs    FH: chemotherapy    Gastroparesis 02/09/2015   Hypercholesteremia    Lung cancer (HCC)    Melanoma (HCC)    Pneumonia    Protein calorie malnutrition (HCC)    Pulmonary embolus (HCC)    Radiation 08/03/14-08/23/14   35 gray to right chest   Past Surgical History:  Past Surgical History:  Procedure Laterality Date   CHOLECYSTECTOMY     duodenostomy tube     ESOPHAGOGASTRODUODENOSCOPY N/A 11/30/2014   Procedure: ESOPHAGOGASTRODUODENOSCOPY (EGD);  Surgeon: Rachael Fee, MD;  Location: [facility];  Service: Endoscopy;  Laterality: N/A;   FLEXIBLE BRONCHOSCOPY Bilateral 11/25/2022   Procedure: FLEXIBLE BRONCHOSCOPY;  Surgeon: Oretha Milch, MD;  Location: [facility];  Service: Pulmonary;  Laterality: Bilateral;   JEJUNOSTOMY FEEDING TUBE     LAPAROTOMY N/A 11/30/2014   Procedure: EXPLORATORY LAPAROTOMY, PYLOROPLASTY, OVERSEWING OF POSTERIOR DUODENUM, DUODENOSTOMY, CHOLECYSTECTOMY, JEJEUNOSTOMY;  Surgeon: Avel Peace, MD;  Location: [facility];  Service: General;  Laterality: N/A;   PYLOROPLASTY     VIDEO BRONCHOSCOPY Bilateral 07/20/2014   Procedure: VIDEO BRONCHOSCOPY WITHOUT FLUORO;  Surgeon: Nyoka Cowden, MD;  Location: [facility];  Service: Cardiopulmonary;  Laterality: Bilateral;   HPI:       Assessment / Plan / Recommendation  Clinical Impression  Patient presents with clinical s/s of dysphagia as per this bedside swallow evaluation, with current  level of attention and cognitive impairment impacting his performance. Patient was awake and alert, eyes remained closed. He was able to adequately attend to PO's introduced (purees via spoon, thin liquids via straw sips). He had an instance of blowing into straw rather than drinking but for all other sips he was able to perform without difficulty. Swallow initiation appeared timely, however patient did exhibit a couple instances of mildly delayed cough. No observed difficulties with puree solids (applesauce). SLP is recommending to advance patient's diet slightly from Clear liquids to Full liquids. Based on previous performance with SLP during recent admission, prognosis of advancing with solids is good, pending improvement in his alertness/attention and decreased confusion. SLP will follow. SLP Visit Diagnosis: Dysphagia, unspecified (R13.10)    Aspiration Risk  Mild aspiration risk;Moderate aspiration risk    Diet Recommendation Thin liquid;Other (Comment) (full liquids)   Supervision: Full supervision/cueing for compensatory strategies;Staff to assist with self feeding Compensations: Slow rate;Small sips/bites Postural Changes: Seated upright at 90 degrees;Remain upright for at least 30 minutes after po intake    Other  Recommendations Oral Care Recommendations: Oral care QID;Staff/trained caregiver to provide oral care;Oral care before and after PO    Recommendations for follow up therapy are one component of a multi-disciplinary discharge planning process, led by the attending physician.  Recommendations may be updated based on patient status, additional functional criteria and insurance authorization.  Follow up Recommendations Follow physician's recommendations for discharge plan and follow up therapies      Assistance Recommended at Discharge    Functional Status Assessment Patient  has had a recent decline in their functional status and demonstrates the ability to make significant  improvements in function in a reasonable and predictable amount of time.  Frequency and Duration min 2x/week  2 weeks       Prognosis Prognosis for improved oropharyngeal function: Good Barriers to Reach Goals: Cognitive deficits      Swallow Study   General Date of Onset: 05/31/23 Type of Study: Bedside Swallow Evaluation Previous Swallow Assessment: BSE in March of 2024 during previous admission Diet Prior to this Study: Thin liquids (Level 0);Clear liquid diet Temperature Spikes Noted: No Respiratory Status: Nasal cannula History of Recent Intubation: No Behavior/Cognition: Lethargic/Drowsy;Distractible;Requires cueing;Alert Oral Cavity Assessment: Dry Oral Care Completed by SLP: Yes Oral Cavity - Dentition: Edentulous;Other (Comment) (has top dentures but these were not placed) Self-Feeding Abilities: Total assist Patient Positioning: Upright in bed Baseline Vocal Quality: Low vocal intensity Volitional Cough: Cognitively unable to elicit Volitional Swallow: Unable to elicit    Oral/Motor/Sensory Function Overall Oral Motor/Sensory Function: Other (comment) (unable to follow directions but no focal weakness or asymmetry observed)   Ice Chips     Thin Liquid Thin Liquid: Impaired Presentation: Straw Oral Phase Impairments: Poor awareness of bolus Pharyngeal  Phase Impairments: Suspected delayed Swallow;Cough - Delayed    Nectar Thick     Honey Thick     Puree Puree: Within functional limits Presentation: Spoon   Solid     Solid: Not tested     Angela Nevin, MA, CCC-SLP Speech Therapy

## 2023-06-04 ENCOUNTER — Other Ambulatory Visit: Payer: Self-pay | Admitting: Radiology

## 2023-06-04 DIAGNOSIS — J9621 Acute and chronic respiratory failure with hypoxia: Secondary | ICD-10-CM | POA: Diagnosis not present

## 2023-06-04 LAB — CBC WITH DIFFERENTIAL/PLATELET
Abs Immature Granulocytes: 0.09 10*3/uL — ABNORMAL HIGH (ref 0.00–0.07)
Basophils Absolute: 0 10*3/uL (ref 0.0–0.1)
Basophils Relative: 0 %
Eosinophils Absolute: 0 10*3/uL (ref 0.0–0.5)
Eosinophils Relative: 0 %
HCT: 43.6 % (ref 39.0–52.0)
Hemoglobin: 13.8 g/dL (ref 13.0–17.0)
Immature Granulocytes: 1 %
Lymphocytes Relative: 9 %
Lymphs Abs: 1.1 10*3/uL (ref 0.7–4.0)
MCH: 28 pg (ref 26.0–34.0)
MCHC: 31.7 g/dL (ref 30.0–36.0)
MCV: 88.6 fL (ref 80.0–100.0)
Monocytes Absolute: 0.7 10*3/uL (ref 0.1–1.0)
Monocytes Relative: 6 %
Neutro Abs: 9.7 10*3/uL — ABNORMAL HIGH (ref 1.7–7.7)
Neutrophils Relative %: 84 %
Platelets: 167 10*3/uL (ref 150–400)
RBC: 4.92 MIL/uL (ref 4.22–5.81)
RDW: 19.6 % — ABNORMAL HIGH (ref 11.5–15.5)
WBC: 11.6 10*3/uL — ABNORMAL HIGH (ref 4.0–10.5)
nRBC: 0 % (ref 0.0–0.2)

## 2023-06-04 LAB — GLUCOSE, CAPILLARY
Glucose-Capillary: 159 mg/dL — ABNORMAL HIGH (ref 70–99)
Glucose-Capillary: 170 mg/dL — ABNORMAL HIGH (ref 70–99)
Glucose-Capillary: 231 mg/dL — ABNORMAL HIGH (ref 70–99)
Glucose-Capillary: 454 mg/dL — ABNORMAL HIGH (ref 70–99)

## 2023-06-04 LAB — BASIC METABOLIC PANEL
Anion gap: 10 (ref 5–15)
BUN: 38 mg/dL — ABNORMAL HIGH (ref 8–23)
CO2: 29 mmol/L (ref 22–32)
Calcium: 8.6 mg/dL — ABNORMAL LOW (ref 8.9–10.3)
Chloride: 99 mmol/L (ref 98–111)
Creatinine, Ser: 1.07 mg/dL (ref 0.61–1.24)
GFR, Estimated: >60 mL/min (ref >60)
Glucose, Bld: 170 mg/dL — ABNORMAL HIGH (ref 70–99)
Potassium: 4 mmol/L (ref 3.5–5.1)
Sodium: 138 mmol/L (ref 135–145)

## 2023-06-04 NOTE — Progress Notes (Signed)
Patient wearing oxygen set at 4lpm with sp02=98% and RR od 17-23 bpm. Patient does not appear to e in any respiratory distress at this time. Bipap on standby.

## 2023-06-04 NOTE — Progress Notes (Signed)
Daily Progress Note   Patient Name: Daryl Vega       Date: 06/04/2023 DOB: Jul 03, 1946  Age: 77 y.o. MRN#: [id number] Attending Physician: Briant Cedar, MD Primary Care Physician: Loyola Mast, NP Admit Date: 05/31/2023 Length of Stay: 4 days  Reason for Consultation/Follow-up: Establishing goals of care  Subjective:   CC: Patient remains agitated and confused. Discussed care with son, Daryl Vega, at bedside. Following up regarding complex medical decision making.   Subjective:  Reviewed EMR. Patient remains confused and agitated. Patient placed on BiPAP overnight though noted to need restrains continued as kept trying to remove this. Received ativan 0.5mg  IV overnight as well at trazodone 50mg .  TOC already contacted son regarding hospice choices and he elected for [facility]. Referral made by TOC. .  Discussed care with bedside RN for updates. Hospitalist planning to obtain head CT today. Patient receiving medications for agitation as needed. Unable to participate in conversation or symptom review.  No family present at bedside. Son planning to come visit after he finishes with work today.   Review of Systems Agitated, confused Objective:   Vital Signs:  BP 116/81 (BP Location: Left Arm)   Pulse 89   Temp 97.9 F (36.6 C) (Axillary)   Resp 13   Ht 5\' 9"  (1.753 m)   Wt 68.8 kg   SpO2 100%   BMI 22.40 kg/m   Physical Exam: General: chronically ill appearing, agitated, confused, cachectic Eyes: no discharge noted HENT: dry mucous membranes Cardiovascular: Tachycardia noted Respiratory: no increased work of breathing noted, not in respiratory distress on nasal cannula support Abdomen: not distended Psychiatric: Agitated in mittens  Imaging:  I personally reviewed recent imaging.   Assessment & Plan:   Assessment: Patient is a 77 year old with COPD chronic respiratory failure and lung cancer who was transferred from [facility].   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 Patient lives by himself with his dogs.  At baseline, he is able to use a walker to do his own activities of daily living however son describes that patient is mostly sedentary. 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  Patient has right upper lobe radiation fibrosis and concern for reoccurrence.  Has chronic loculated right pleural effusion as well.  Had a high O2 requirements initially required BiPAP now on 10 L supplemental oxygen via nasal cannula Is on bronchodilators, IV Solu-Medrol, empiric antibiotics. Radiation oncology has been consulted and patient is considering XRT short course. Palliative consult for goals of care discussions.  Recommendations/Plan: # Complex medical decision making/goals of care:  - Patient unable to participate in complex medical decision-making due to current medical status. - Had discussed care with patient's son, Daryl Vega, on 6/19. No family present at bedside today during visit. Son planning to be in later today as per RN. TOC already placed referral to son's home hospice choice of [facility]. Appreciate TOC's assistance with coordination of this care.   -  Code Status: DNR  # Symptom management: Non-pharmacologic Delirium Precautions  - Frequent re-orientation; update board w/ date, names of treatment team  - Daytime stimulation: Open curtains, turn lights on, and turn on television as tolerated during waking hours  - Nighttime calm - close curtains, turn lights off, and turn off television  at 9pm  with minimal interruptions from treatment team during sleeping hours, including avoiding lab draws if possible  - Encourage continued  presence of family/friends  - Occupy w/ distractions - e.g. ask them to fold washcloths, busy-board  - Ensure adequate sensorium, to include providing glasses, dentures, and hearing aids to patient as appropriate  - Ensure adequate nutrition and fluid intake  - Avoid restraints whenever possible, but "safety first" . If restraints are necessary, please monitor CPK and BUN/Cr  - Assess and treat pain (incl nonverbal cues); e.g. consider scheduled tylenol  - Assess and treat constipation and urinary retention   # Psychosocial Support:  -Son   # Discharge Planning: Home with hospice support; referral placed and TOC to coordinate   Discussed with: bedside RN,   Thank you for allowing the palliative care team to participate in the care [facility].  Alvester Morin, DO Palliative Care Provider PMT # [phone]  If patient remains symptomatic despite maximum doses, please call PMT at [phone] between 0700 and 1900. Outside of these hours, please call attending, as PMT does not have night coverage.  This provider spent a total of 35 minutes providing patient's care.  Includes review of EMR, discussing care with other staff members involved in patient's medical care, obtaining relevant history and information from patient and/or patient's family, and personal review of imaging and lab work. Greater than 50% of the time was spent counseling and coordinating care related to the above assessment and plan.    *Please note that this is a verbal dictation therefore any spelling or grammatical errors are due to the "Dragon Medical One" system interpretation.

## 2023-06-04 NOTE — Progress Notes (Signed)
Palliative team had a goals of care discussion with the patient's son, his primary caregiver at this point. Due to the patient's increasing agitation, he is currently not a candidate for radiation. Patient's son has also communicated with the palliative care team and expressed that he does not wish to pursue radiation for his father at this time. We are open to reassessing the situation in the future if desired, but we will not plan for radiation therapy at this time.    Joyice Faster, PA-C

## 2023-06-04 NOTE — Plan of Care (Signed)
  Problem: Education: Goal: Knowledge of General Education information will improve Description: Including pain rating scale, medication(s)/side effects and non-pharmacologic comfort measures Outcome: Not Progressing   Problem: Health Behavior/Discharge Planning: Goal: Ability to manage health-related needs will improve Outcome: Not Progressing   Problem: Clinical Measurements: Goal: Ability to maintain clinical measurements within normal limits will improve Outcome: Not Progressing Goal: Will remain free from infection Outcome: Not Progressing Goal: Diagnostic test results will improve Outcome: Not Progressing Goal: Respiratory complications will improve Outcome: Not Progressing Goal: Cardiovascular complication will be avoided Outcome: Not Progressing   Problem: Activity: Goal: Risk for activity intolerance will decrease Outcome: Not Progressing   Problem: Nutrition: Goal: Adequate nutrition will be maintained Outcome: Not Progressing   Problem: Coping: Goal: Level of anxiety will decrease Outcome: Not Progressing   Problem: Elimination: Goal: Will not experience complications related to bowel motility Outcome: Not Progressing Goal: Will not experience complications related to urinary retention Outcome: Not Progressing   Problem: Pain Managment: Goal: General experience of comfort will improve Outcome: Not Progressing   Problem: Safety: Goal: Ability to remain free from injury will improve Outcome: Not Progressing   Problem: Skin Integrity: Goal: Risk for impaired skin integrity will decrease Outcome: Not Progressing   Problem: Education: Goal: Ability to describe self-care measures that may prevent or decrease complications (Diabetes Survival Skills Education) will improve Outcome: Not Progressing Goal: Individualized Educational Video(s) Outcome: Not Progressing   Problem: Coping: Goal: Ability to adjust to condition or change in health will  improve Outcome: Not Progressing   Problem: Fluid Volume: Goal: Ability to maintain a balanced intake and output will improve Outcome: Not Progressing   Problem: Health Behavior/Discharge Planning: Goal: Ability to identify and utilize available resources and services will improve Outcome: Not Progressing Goal: Ability to manage health-related needs will improve Outcome: Not Progressing   Problem: Metabolic: Goal: Ability to maintain appropriate glucose levels will improve Outcome: Not Progressing   Problem: Nutritional: Goal: Maintenance of adequate nutrition will improve Outcome: Not Progressing Goal: Progress toward achieving an optimal weight will improve Outcome: Not Progressing   Problem: Skin Integrity: Goal: Risk for impaired skin integrity will decrease Outcome: Not Progressing   Problem: Tissue Perfusion: Goal: Adequacy of tissue perfusion will improve Outcome: Not Progressing   Problem: Safety: Goal: Non-violent Restraint(s) Outcome: Not Progressing

## 2023-06-04 NOTE — Progress Notes (Signed)
   06/04/23 0401  BiPAP/CPAP/SIPAP  BiPAP/CPAP/SIPAP Pt Type Adult (Pt. taken off BiPAP V60, placed back on HFNC([place])4 lpm, RN aware.)  BiPAP/CPAP /SiPAP Vitals  Pulse Rate 93  Resp 18  BP 136/83  SpO2 95 %  MEWS Score/Color  MEWS Score 0  MEWS Score Color [place] Si

## 2023-06-04 NOTE — Progress Notes (Signed)
Speech Language Pathology Treatment: Dysphagia  Patient Details Name: Daryl Vega MRN: [id number] DOB: Jul 09, 1946 Today's Date: 06/04/2023 Time: 1750-1805 SLP Time Calculation (min) (ACUTE ONLY): 15 min  Assessment / Plan / Recommendation Clinical Impression  Pt seen today for dysphagia management - determining readiness for dietary advancement. He was greeted slid down in bed and immediately after repositioned by SLP/RN, pt slid down again. He was willing to consume solids - and liquids - stating he wanted "baked spaghetti". Pt benefited from moderate verbal cues to take small bites and to swallow before speaking; pt is loqaucious and polite today. He was able to consume a large soft cookie -with NO indication of aspiration. Subtle cough observed x2 of approx 10 water boluses but this did not cause him discomfort. At this time, recommend to advance to soft/thin diet as pt only has upper denture and is dysarthric. Note plans to dc home with hospice, thus no SLP follow up indicated. Posted updated swallow precaution signs and advised RN to recommendations. Thanks for allowing this SLP to help with this pt's care plan. [name]     HPI HPI: 77 yo male with COPD, chronic respiratory failure and lung cancer followed by Dr. Vassie Loll.  He was in hospital in December 2023 with dyspnea and and hypoxia CT angiogram chest showed radiation changes at the right apex with soft tissue density around the right mainstem bronchus and invading into carina through tracheal wall.  He had bronchoscopy in December 2023 but Rt main bronchus was narrowed immediately after takeoff, the narrowing could not be traversed.  He presented to [facility] with worsening dyspnea.  CT angio chest from 05/31/23 shows Rt paratracheal and hilar mass extending into the superior and anterior aspect of Rt PA progressive tumor thrombus invading the Rt main PA.  He was started on therapy for COPD exacerbation with Bipap, and transferred to [facility] for further  management.  Pt had undergone prior swallow evaluation with recommendation for soft/thin diet.  Swallow eval reordered and completed on 6/19 with full liquids initiated. RN reports pt wants a cookie and goal is to dc home with hospice.      SLP Plan  All goals met      Recommendations for follow up therapy are one component of a multi-disciplinary discharge planning process, led by the attending physician.  Recommendations may be updated based on patient status, additional functional criteria and insurance authorization.    Recommendations  Diet recommendations: Dysphagia 3 (mechanical soft);Thin liquid Liquids provided via: Cup;Straw Medication Administration: Whole meds with puree Supervision: Patient able to self feed;Intermittent supervision to cue for compensatory strategies Compensations: Slow rate;Small sips/bites Postural Changes and/or Swallow Maneuvers: Seated upright 90 degrees;Upright 30-60 min after meal                  Oral care QID;Staff/trained caregiver to provide oral care;Oral care before and after PO     Dysphagia, unspecified (R13.10)     All goals met    Daryl Infante, MS [facility] SLP Acute Rehab Services Office [phone]  Daryl Vega  06/04/2023, 8:23 PM

## 2023-06-04 NOTE — Progress Notes (Signed)
PROGRESS NOTE    [name]  ZOX:[id number] DOB: Jan 18, 1946 DOA: 05/31/2023 PCP: Loyola Mast, NP    Brief Narrative:  Daryl Vega  is a 77 y.o. male with past medical history of prior PE, recurrent lung cancer, melanoma, DM2, HLD, chronic hypoxic respiratory failure on at least 5 L of oxygen via nasal cannula at home presents by EMS on 05/31/2023 with worsening respiratory failure and increased oxygen requirement. 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 Pt admitted for further management.   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  Also noted risk of massive bleeding with radiation Palliative consulted, son agreed for home with hospice Supplemental O2, BiPAP as needed  Possible acute COPD exacerbation In the setting of #1 above Completed azithromycin, continue Rocephin, prednisone, bronchodilators, mucolytics PCCM consulted  Acute on chronic systolic and diastolic HF BNP 8119 CT chest showing pulmonary edema Echo done with EF of 25%, global hypokinesis, small pericardial effusion Continue IV diuresis Due to overall poor prognosis, patient switching over to hospice, will hold off on cardiology consult for now  Acute metabolic encephalopathy Likely multifactorial, as noted above Patient noted to be agitated/altered, could also be progression of disease UA negative for any infection CT head to r/o any acute abnormality Requiring restraints, reassess frequently for  removal Delirium precautions   DM2-recent A1c 5.9 Hold glipizide and metformin SSI   Anemia of chronic disease chronic anemia Malignancy/chronic illness as well as iron deficiency Continue iron supplementation   History of PE (September 2016 )--- patient with underlying malignancy High risk for DVT and PE---CTA chest on 05/31/2023 without acute PE at this time Please see PCM transfer request note from Dr. Craige Cotta recommending against full anticoagulation at this time, due to risk of massive bleeding May use Lovenox prophylactically at this time       DVT prophylaxis: enoxaparin (LOVENOX) injection 40 mg Start: 05/31/23 2200 SCDs Start: 05/31/23 1352 Place TED hose Start: 05/31/23 1352    Code Status: DNR   Disposition Plan:  Level of care: Stepdown Status is: Inpatient Remains inpatient appropriate    Consultants:  PCCM Rad onc Pallaitive care   Subjective: Still remains confused/agitated.  Unable to communicate   Objective: Vitals:   06/04/23 0851 06/04/23 1000 06/04/23 1200 06/04/23 1400  BP:  113/68 113/69 126/78  Pulse:  92 88 98  Resp:  17 15 20   Temp:   97.9 F (36.6 C)   TempSrc:   Axillary   SpO2: 98% 91% 99% 100%  Weight:      Height:        Intake/Output Summary (Last 24 hours) at 06/04/2023 1504 Last data filed at 06/04/2023 1455 Gross per 24 hour  Intake 450 ml  Output 3610 ml  Net -3160 ml   Filed Weights   05/31/23 0923 05/31/23 1730  Weight: 77.1 kg 68.8 kg    Examination: General: Intermittent agitation, confused Cardiovascular: S1, S2 present Respiratory: Diminished breath sounds bilaterally Abdomen: Soft, nontender, nondistended, bowel sounds present Musculoskeletal: No bilateral pedal edema noted Skin: Normal  Psychiatry: Unable to assess       Data Reviewed: I have personally reviewed following labs and imaging studies  CBC: Recent Labs  Lab 05/31/23 1016 06/01/23 0323 06/02/23 0311 06/04/23 0321  WBC 11.7* 4.1  12.7* 11.6*  NEUTROABS 9.0*  --   --  9.7*  HGB 12.7* 11.3* 11.8* 13.8  HCT 40.9 35.8* 37.0* 43.6  MCV 91.7 90.6 89.2 88.6  PLT 186 170 161 167   Basic Metabolic Panel: Recent Labs  Lab 05/31/23 1016 06/01/23 0323 06/02/23 0307 06/02/23 0311 06/03/23 0814 06/04/23 0321  NA 138 139  --  139 138 138  K 4.3 4.9  --  4.2 3.6 4.0  CL 106 105  --  105 102 99  CO2 22 24  --  24 26 29   GLUCOSE 90 220*  --  213* 197* 170*  BUN 34* 34*  --  34* 41* 38*  CREATININE 1.16 1.21  --  1.11 1.11 1.07  CALCIUM 9.5 9.0  --  8.7* 8.5* 8.6*  MG  --   --  1.8  --   --   --    GFR: Estimated Creatinine Clearance: 58 mL/min (by C-G formula based on SCr of 1.07 mg/dL). Liver Function Tests: No results for input(s): "AST", "ALT", "ALKPHOS", "BILITOT", "PROT", "ALBUMIN" in the last 168 hours. No results for input(s): "LIPASE", "AMYLASE" in the last 168 hours. No results for input(s): "AMMONIA" in the last 168 hours. Coagulation Profile: No results for input(s): "INR", "PROTIME" in the last 168 hours. Cardiac Enzymes: No results for input(s): "CKTOTAL", "CKMB", "CKMBINDEX", "TROPONINI" in the last 168 hours. BNP (last 3 results) No results for input(s): "PROBNP" in the last 8760 hours. HbA1C: No results for input(s): "HGBA1C" in the last 72 hours. CBG: Recent Labs  Lab 06/03/23 1128 06/03/23 1641 06/03/23 2128 06/04/23 0746 06/04/23 1213  GLUCAP 196* 315* 271* 159* 170*   Lipid Profile: No results for input(s): "CHOL", "HDL", "LDLCALC", "TRIG", "CHOLHDL", "LDLDIRECT" in the last 72 hours. Thyroid Function Tests: No results for input(s): "TSH", "T4TOTAL", "FREET4", "T3FREE", "THYROIDAB" in the last 72 hours. Anemia Panel: No results for input(s): "VITAMINB12", "FOLATE", "FERRITIN", "TIBC", "IRON", "RETICCTPCT" in the last 72 hours. Sepsis Labs: Recent Labs  Lab 05/31/23 1017 05/31/23 1123  LATICACIDVEN 2.6* 2.6*    Recent Results (from the past 240 hour(s))  SARS Coronavirus 2 by  RT PCR (hospital order, performed in [facility] hospital lab) *cepheid single result test* Anterior Nasal Swab     Status: None   Collection Time: 05/31/23  9:53 AM   Specimen: Anterior Nasal Swab  Result Value Ref Range Status   SARS Coronavirus 2 by RT PCR NEGATIVE NEGATIVE Final    Comment: (NOTE) SARS-CoV-2 target nucleic acids are NOT DETECTED.  The SARS-CoV-2 RNA is generally detectable in upper and lower respiratory specimens during the acute phase of infection. The lowest concentration of SARS-CoV-2 viral copies this assay can detect is 250 copies / mL. A negative result does not preclude SARS-CoV-2 infection and should not be used as the sole basis for treatment or other patient management decisions.  A negative result may occur with improper specimen collection / handling, submission of specimen other than nasopharyngeal swab, presence of viral mutation(s) within the areas targeted by this assay, and inadequate number of viral copies (<250 copies / mL). A negative result must be combined with clinical observations, patient history, and epidemiological information.  Fact Sheet for Patients:   RoadLapTop.co.za  Fact Sheet for  Healthcare Providers: http://kim-miller.com/  This test is not yet approved or  cleared by the [place] and has been authorized for detection and/or diagnosis of SARS-CoV-2 by FDA under an Emergency Use Authorization (EUA).  This EUA will remain in effect (meaning this test can be used) for the duration of the COVID-19 declaration under Section 564(b)(1) of the Act, 21 U.S.C. section 360bbb-3(b)(1), unless the authorization is terminated or revoked sooner.  Performed at [facility], [address], [place], [place] [address]   Culture, blood (routine x 2)     Status: None (Preliminary result)   Collection Time: 05/31/23 10:16 AM   Specimen: Right Antecubital; Blood  Result Value Ref Range Status    Specimen Description   Final    RIGHT ANTECUBITAL BOTTLES DRAWN AEROBIC AND ANAEROBIC Performed at [facility], [address] [place], [place] [address]    Special Requests   Final    Blood Culture adequate volume Performed at [facility], [address] [place], [place] [address]    Culture   Final    NO GROWTH 4 DAYS Performed at [facility], [address], [place], [place] [address]    Report Status PENDING  Incomplete  Culture, blood (routine x 2)     Status: None (Preliminary result)   Collection Time: 05/31/23 10:17 AM   Specimen: BLOOD RIGHT HAND  Result Value Ref Range Status   Specimen Description   Final    BLOOD RIGHT HAND BOTTLES DRAWN AEROBIC AND ANAEROBIC Performed at [facility], [address], [place], [place] [address]    Special Requests   Final    Blood Culture adequate volume Performed at [facility], [address] [place], [place] [address]    Culture   Final    NO GROWTH 4 DAYS Performed at [facility], [address], [place], [place] [address]    Report Status PENDING  Incomplete  MRSA Next Gen by PCR, Nasal     Status: None   Collection Time: 06/01/23 10:23 AM   Specimen: Nasal Mucosa; Nasal Swab  Result Value Ref Range Status   MRSA by PCR Next Gen NOT DETECTED NOT DETECTED Final    Comment: (NOTE) The GeneXpert MRSA Assay (FDA approved for NASAL specimens only), is one component of a comprehensive MRSA colonization surveillance program. It is not intended to diagnose MRSA infection nor to guide or monitor treatment for MRSA infections. Test performance is not FDA approved in patients less than 21 years old. Performed at [facility], [address] [place], [place] [address]          Radiology Studies: No results found.      Scheduled Meds:  arformoterol  15 mcg Nebulization BID   budesonide (PULMICORT) nebulizer solution  0.25 mg Nebulization BID   Chlorhexidine Gluconate Cloth  6 each  Topical QHS   dextromethorphan-guaiFENesin  1 tablet Oral BID   enoxaparin (LOVENOX) injection  40 mg Subcutaneous Q24H   ferrous sulfate  325 mg Oral Q1500   furosemide  40 mg Intravenous Daily   gabapentin  300 mg Oral TID   insulin aspart  0-5 Units Subcutaneous QHS   insulin aspart  0-6 Units Subcutaneous TID WC   mirtazapine  7.5 mg Oral QHS   predniSONE  40 mg Oral Q breakfast   revefenacin  175 mcg Nebulization Daily   rosuvastatin  20 mg Oral QHS   sodium chloride flush  3 mL Intravenous  Q12H   Continuous Infusions:  cefTRIAXone (ROCEPHIN)  IV Stopped (06/04/23 0944)     LOS: 4 days     Briant Cedar, MD [facility] Available via Epic secure chat 7am-7pm After these hours, please refer to coverage provider listed on [email or web address] 06/04/2023, 3:04 PM

## 2023-06-04 NOTE — Progress Notes (Signed)
SLP Cancellation Note  Patient Details Name: Daryl Vega MRN: [id number] DOB: 13-Feb-1946   Cancelled treatment:       Reason Eval/Treat Not Completed: Other (comment) (RN reports pt is not alert at this time, has dentures at bedside however; She advised she will alert SLP if pt awakens adequately for po)  Rolena Infante, MS [facility] SLP Acute Rehab Services Office [phone]   [name] 06/04/2023, 11:10 AM

## 2023-06-05 ENCOUNTER — Inpatient Hospital Stay (HOSPITAL_COMMUNITY): Payer: Medicare Other

## 2023-06-05 DIAGNOSIS — J9621 Acute and chronic respiratory failure with hypoxia: Secondary | ICD-10-CM | POA: Diagnosis not present

## 2023-06-05 LAB — CBC WITH DIFFERENTIAL/PLATELET
Abs Immature Granulocytes: 0.09 10*3/uL — ABNORMAL HIGH (ref 0.00–0.07)
Basophils Absolute: 0 10*3/uL (ref 0.0–0.1)
Basophils Relative: 0 %
Eosinophils Absolute: 0 10*3/uL (ref 0.0–0.5)
Eosinophils Relative: 0 %
HCT: 44.7 % (ref 39.0–52.0)
Hemoglobin: 14.7 g/dL (ref 13.0–17.0)
Immature Granulocytes: 1 %
Lymphocytes Relative: 9 %
Lymphs Abs: 0.9 10*3/uL (ref 0.7–4.0)
MCH: 28.7 pg (ref 26.0–34.0)
MCHC: 32.9 g/dL (ref 30.0–36.0)
MCV: 87.3 fL (ref 80.0–100.0)
Monocytes Absolute: 0.7 10*3/uL (ref 0.1–1.0)
Monocytes Relative: 7 %
Neutro Abs: 7.6 10*3/uL (ref 1.7–7.7)
Neutrophils Relative %: 83 %
Platelets: 175 10*3/uL (ref 150–400)
RBC: 5.12 MIL/uL (ref 4.22–5.81)
RDW: 19 % — ABNORMAL HIGH (ref 11.5–15.5)
WBC: 9.2 10*3/uL (ref 4.0–10.5)
nRBC: 0 % (ref 0.0–0.2)

## 2023-06-05 LAB — BASIC METABOLIC PANEL
Anion gap: 10 (ref 5–15)
BUN: 35 mg/dL — ABNORMAL HIGH (ref 8–23)
CO2: 29 mmol/L (ref 22–32)
Calcium: 8.5 mg/dL — ABNORMAL LOW (ref 8.9–10.3)
Chloride: 96 mmol/L — ABNORMAL LOW (ref 98–111)
Creatinine, Ser: 1.01 mg/dL (ref 0.61–1.24)
GFR, Estimated: >60 mL/min (ref >60)
Glucose, Bld: 321 mg/dL — ABNORMAL HIGH (ref 70–99)
Potassium: 3.5 mmol/L (ref 3.5–5.1)
Sodium: 135 mmol/L (ref 135–145)

## 2023-06-05 LAB — GLUCOSE, CAPILLARY
Glucose-Capillary: 293 mg/dL — ABNORMAL HIGH (ref 70–99)
Glucose-Capillary: 267 mg/dL — ABNORMAL HIGH (ref 70–99)
Glucose-Capillary: 243 mg/dL — ABNORMAL HIGH (ref 70–99)
Glucose-Capillary: 327 mg/dL — ABNORMAL HIGH (ref 70–99)
Glucose-Capillary: 290 mg/dL — ABNORMAL HIGH (ref 70–99)
Glucose-Capillary: 440 mg/dL — ABNORMAL HIGH (ref 70–99)

## 2023-06-05 LAB — CULTURE, BLOOD (ROUTINE X 2)
Culture: NO GROWTH
Special Requests: ADEQUATE

## 2023-06-05 MED ORDER — INSULIN ASPART 100 UNIT/ML IJ SOLN
5.0000 [IU] | Freq: Once | INTRAMUSCULAR | Status: AC
  Administered 2023-06-05: 5 [IU] via SUBCUTANEOUS

## 2023-06-05 MED ORDER — INSULIN ASPART 100 UNIT/ML IJ SOLN
0.0000 [IU] | Freq: Three times a day (TID) | INTRAMUSCULAR | Status: DC
  Administered 2023-06-05: 3 [IU] via SUBCUTANEOUS
  Administered 2023-06-05 – 2023-06-06 (×2): 5 [IU] via SUBCUTANEOUS
  Administered 2023-06-06: 9 [IU] via SUBCUTANEOUS
  Administered 2023-06-06: 5 [IU] via SUBCUTANEOUS

## 2023-06-05 NOTE — Progress Notes (Signed)
   06/05/23 2036  BiPAP/CPAP/SIPAP  BiPAP/CPAP/SIPAP Pt Type Adult  BiPAP/CPAP/SIPAP V60  Reason BIPAP/CPAP not in use Non-compliant (PRN order pt been refusing on 2L [place] and doing well)

## 2023-06-05 NOTE — Progress Notes (Signed)
Patient AM PO meds administered; patient showed signs of aspiration after administration with coughing/constant and continued swallowing/gurgling. Sat upright at a 90 degree angle during administration and left upright for at least 30 minutes until patient became agitated/combative/verbally threatening staff to be laid back.

## 2023-06-05 NOTE — Progress Notes (Signed)
Daily Progress Note   Patient Name: Guthrie Lemme [facility]       Date: 06/05/2023 DOB: Apr 25, 1946  Age: 77 y.o. MRN#: [id number] Attending Physician: Briant Cedar, MD Primary Care Physician: Loyola Mast, NP Admit Date: 05/31/2023 Length of Stay: 5 days  Reason for Consultation/Follow-up: Establishing goals of care  Subjective:   CC: Patient sleeping when seen this morning.  Patient has remained agitated as per RN.  No family at bedside during visit.  Spoke with patient's son, Kash, over the phone. Following up regarding complex medical decision making.   Subjective:  Reviewed EMR.  Patient has remained agitated.  Patient has required IV Ativan 0.5 mg x 1 dose.  Patient has been too confused to safely take oral medications.  Concerned about patient aspirating due to confusion.  To bedside to check on patient.  Patient sleeping in bed when seen.  Discussed care with bedside RN.  No family present at time of visit.  Able to discuss care with IDT. Called and spoke with patient's son, Calvert, medically update about patient's continued deteriorating status.  Patient refusing care and oral intake.  Sahmir had specifically stated that his goal is for patient not to die in the hospital.  Expressed worry that patient's medical status will continue to deteriorate so very real possibility he may die in the hospital.  Son was wanting to take patient home with hospice.  Son noted he was informed that the hospice agency he chose would not be able to come out until next week to admit the patient.  Patient's son did not know he could change hospice groups and so he would like to pursue doing this so that he can get patient home as soon as possible.  Acknowledged this and noted would inform TOC to coordinate care. Followed up with IDT after discussion with son.  Review of Systems Sleeping Objective:   Vital Signs:  BP 119/78 (BP Location: Left Arm)   Pulse 85   Temp 97.6 F (36.4 C) (Oral)    Resp (!) 21   Ht 5\' 9"  (1.753 m)   Wt 68.8 kg   SpO2 95%   BMI 22.40 kg/m   Physical Exam: General: chronically ill appearing, agitated at times, confused, cachectic Eyes: no discharge noted HENT: dry mucous membranes Cardiovascular: Tachycardia noted Respiratory: no increased work of breathing noted, not in respiratory distress on nasal cannula support Abdomen: not distended Psychiatric: Agitated in mittens  Imaging:  I personally reviewed recent imaging.   Assessment & Plan:   Assessment: Patient is a 77 year old with COPD chronic respiratory failure and lung cancer who was transferred from [facility].  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 Patient lives by himself with his dogs.  At baseline, he is able to use a walker to do his own activities of daily living however son describes that patient is mostly sedentary. 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  Patient has right upper lobe radiation fibrosis and concern for reoccurrence.  Has chronic loculated right pleural effusion as well.  Had a high O2 requirements initially required BiPAP now on 10 L supplemental oxygen via nasal cannula Is on bronchodilators, IV Solu-Medrol, empiric antibiotics. Radiation oncology has been consulted and patient is considering XRT short course. Palliative consult for  goals of care discussions.  Recommendations/Plan: # Complex medical decision making/goals of care:  - Patient unable to participate in complex medical decision-making due to current medical status. - Again discussed care with patient's son, Ely. Bram remains intent that with patient's deteriorating medical status, patient would not want to die in  the hospital. Goals if to get patient home with hospice to allow him time at home until death.  Acknowledged this and encouraged further conversations with TOC regarding hospitalist group that would be able to admit patient before next week.  Son agreeing with this plan.   -Discontinued interventions that are going to worsen patient's agitation such as imaging and lab work.  -  Code Status: DNR  # Symptom management: Non-pharmacologic Delirium Precautions  - Frequent re-orientation; update board w/ date, names of treatment team  - Daytime stimulation: Open curtains, turn lights on, and turn on television as tolerated during waking hours  - Nighttime calm - close curtains, turn lights off, and turn off television at 9pm  with minimal interruptions from treatment team during sleeping hours, including avoiding lab draws if possible  - Encourage continued presence of family/friends  - Occupy w/ distractions - e.g. ask them to fold washcloths, busy-board  - Ensure adequate sensorium, to include providing glasses, dentures, and hearing aids to patient as appropriate  - Ensure adequate nutrition and fluid intake  - Avoid restraints whenever possible, but "safety first" . If restraints are necessary, please monitor CPK and BUN/Cr  - Assess and treat pain (incl nonverbal cues); e.g. consider scheduled tylenol  - Assess and treat constipation and urinary retention   # Psychosocial Support:  -Son   # Discharge Planning: Home with hospice support as soon as can be set up and coordinated   Discussed with: bedside RN, son, hosptialist, TOC   Thank you for allowing the palliative care team to participate in the care Helayne Seminole Burnsed.  Alvester Morin, DO Palliative Care Provider PMT # [phone]  If patient remains symptomatic despite maximum doses, please call PMT at [phone] between 0700 and 1900. Outside of these hours, please call attending, as PMT does not have night coverage.  This  provider spent a total of 37 minutes providing patient's care.  Includes review of EMR, discussing care with other staff members involved in patient's medical care, obtaining relevant history and information from patient and/or patient's family, and personal review of imaging and lab work. Greater than 50% of the time was spent counseling and coordinating care related to the above assessment and plan.    *Please note that this is a verbal dictation therefore any spelling or grammatical errors are due to the "Dragon Medical One" system interpretation.

## 2023-06-05 NOTE — Inpatient Diabetes Management (Addendum)
Inpatient Diabetes Program Recommendations  AACE/ADA: New Consensus Statement on Inpatient Glycemic Control (2015)  Target Ranges:  Prepandial:   less than 140 mg/dL      Peak postprandial:   less than 180 mg/dL (1-2 hours)      Critically ill patients:  140 - 180 mg/dL   Lab Results  Component Value Date   GLUCAP 267 (H) 06/05/2023   HGBA1C 7.1 (H) 02/08/2023    Review of Glycemic Control  Latest Reference Range & Units 06/04/23 07:46 06/04/23 12:13 06/04/23 16:15 06/04/23 22:13 06/05/23 02:03 06/05/23 08:09  Glucose-Capillary 70 - 99 mg/dL 161 (H) 096 (H) 045 (H) 454 (H) 293 (H) 267 (H)  (H): Data is abnormally high  Diabetes history: DM2 Outpatient Diabetes medications: Glucotrol 10 mg BID, Metformin 500 mg BID Current orders for Inpatient glycemic control: Novolog 0-9 units TID and Prednisone 40 mg QAM  Inpatient Diabetes Program Recommendations:    Notes Novolog correction was increased to sensitive TID.  Please also consider:  Novolog 3 units TID with meals if he consumes at least 50%  Will continue to follow while inpatient.  Thank you, Dulce Sellar, MSN, CDCES Diabetes Coordinator Inpatient Diabetes Program [phone] (team pager from 8a-5p)

## 2023-06-05 NOTE — TOC Progression Note (Addendum)
Transition of Care [facility]) - Progression Note    Patient Details  Name: Daryl Vega MRN: [id number] Date of Birth: [birth year]  Transition [facility]) CM/SW Contact  Larrie Kass, LCSW Phone Number: 06/05/2023, 10:50 AM  Clinical Narrative:    CSW spoke with the [facility] hospice liaison, who reported the agency is still working on getting DME arranged in the home. She stated they would not have anyone to come out this weekend, d/c will be Monday or Tuesday of next week. TOC to follow.    Adden 11:15am CSW spoke with pt's son Freida Busman and provided an update on the hospice agency that is not able to see pt this weekend. Pt's son reports he is ok with this and inquired about a hospital bed. CSW informed pt's son to contact the hospice agency and update them on the need for a hospital bed. TOC to follow.   11:39 am  CSW received a message from the Palliative MD , she reports pt's son would like to change Hospice agencies. CSW spoke with pt's son Freida Busman, he reports wanting to change hospice, to get his dad home. He stated "I didn't want it to be a big problem" CSW informed pt it was not a problem, it is his choice. Pt's son has chosen [facility] for home hospice services.   CSW sent a referral to [facility]. CSW spoke with [facility], she reports pt will need DME orders for home O2 and a hospital bed. Made MD aware. TOC to follow   Expected Discharge Plan: Home w Hospice Care Barriers to Discharge: Continued Medical Work up  Expected Discharge Plan and Services       Living arrangements for the past 2 months: Single Family Home                                       Social Determinants of Health (SDOH) Interventions SDOH Screenings   Food Insecurity: Patient Unable To Answer (05/31/2023)  Housing: High Risk (05/31/2023)  Transportation Needs: Patient Unable To Answer (05/31/2023)  Utilities: Patient Unable To Answer (05/31/2023)  Tobacco  Use: Medium Risk (05/31/2023)    Readmission Risk Interventions     No data to display

## 2023-06-05 NOTE — Progress Notes (Signed)
PROGRESS NOTE    Nicklous Aburto [facility]  ZOX:[id number] DOB: [birth year] DOA: 05/31/2023 PCP: Loyola Mast, NP    Brief Narrative:  Daryl Vega  is a 77 y.o. male with past medical history of prior PE, recurrent lung cancer, melanoma, DM2, HLD, chronic hypoxic respiratory failure on at least 5 L of oxygen via nasal cannula at home presents by EMS on 05/31/2023 with worsening respiratory failure and increased oxygen requirement. 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 Pt admitted for further management.   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  Also noted risk of massive bleeding with radiation Palliative consulted, son agreed for home with hospice Supplemental O2  Possible acute COPD exacerbation In the setting of #1 above Completed azithromycin/Rocephin, prednisone, bronchodilators, mucolytics PCCM consulted  Acute on chronic systolic and diastolic HF BNP 8119 CT chest showing pulmonary edema Echo done with EF of 25%, global hypokinesis, small pericardial effusion Held IV diuresis due to poor oral intake/poor prognosis Due to overall poor prognosis, patient switching over to hospice, will hold off on cardiology consult for now  Acute metabolic encephalopathy Likely multifactorial, as noted above Patient noted to be agitated/altered, could also be progression of disease UA negative for any infection Unable to lay still for CT head  Requiring restraints, reassess frequently for  removal Delirium precautions   DM2-recent A1c 5.9 Hold glipizide and metformin SSI   Anemia of chronic disease chronic anemia Malignancy/chronic illness as well as iron deficiency Continue iron supplementation   History of PE (September 2016 )--- patient with underlying malignancy High risk for DVT and PE---CTA chest on 05/31/2023 without acute PE at this time Please see PCM transfer request note from Dr. Craige Cotta recommending against full anticoagulation at this time, due to risk of massive bleeding May use Lovenox prophylactically at this time       DVT prophylaxis: enoxaparin (LOVENOX) injection 40 mg Start: 05/31/23 2200 SCDs Start: 05/31/23 1352 Place TED hose Start: 05/31/23 1352    Code Status: DNR   Disposition Plan:  Level of care: Stepdown Status is: Inpatient Remains inpatient appropriate    Consultants:  PCCM Rad onc Pallaitive care   Subjective: Still remains confused/intermittently agitated.  Poor prognosis.  Plan for home hospice once DME equipments are delivered.   Objective: Vitals:   06/05/23 1206 06/05/23 1330 06/05/23 1400 06/05/23 1600  BP:   115/69 114/75  Pulse:  92 92 96  Resp:  (!) 32 16 20  Temp: 98.4 F (36.9 C)   98.8 F (37.1 C)  TempSrc: Axillary   Oral  SpO2:  (!) 89% 98% 96%  Weight:      Height:        Intake/Output Summary (Last 24 hours) at 06/05/2023 1846 Last data filed at 06/05/2023 1448 Gross per 24 hour  Intake 660 ml  Output 1150 ml  Net -490 ml   Filed Weights   05/31/23 0923 05/31/23 1730  Weight: 77.1 kg 68.8 kg    Examination: General: Intermittent agitation, confused Cardiovascular: S1, S2 present Respiratory: Diminished breath  sounds bilaterally Abdomen: Soft, nontender, nondistended, bowel sounds present Musculoskeletal: No bilateral pedal edema noted Skin: Normal Psychiatry: Unable to assess       Data Reviewed: I have personally reviewed following labs and imaging studies  CBC: Recent  Labs  Lab 05/31/23 1016 06/01/23 0323 06/02/23 0311 06/04/23 0321 06/05/23 0324  WBC 11.7* 4.1 12.7* 11.6* 9.2  NEUTROABS 9.0*  --   --  9.7* 7.6  HGB 12.7* 11.3* 11.8* 13.8 14.7  HCT 40.9 35.8* 37.0* 43.6 44.7  MCV 91.7 90.6 89.2 88.6 87.3  PLT 186 170 161 167 175   Basic Metabolic Panel: Recent Labs  Lab 06/01/23 0323 06/02/23 0307 06/02/23 0311 06/03/23 0814 06/04/23 0321 06/05/23 0324  NA 139  --  139 138 138 135  K 4.9  --  4.2 3.6 4.0 3.5  CL 105  --  105 102 99 96*  CO2 24  --  24 26 29 29   GLUCOSE 220*  --  213* 197* 170* 321*  BUN 34*  --  34* 41* 38* 35*  CREATININE 1.21  --  1.11 1.11 1.07 1.01  CALCIUM 9.0  --  8.7* 8.5* 8.6* 8.5*  MG  --  1.8  --   --   --   --    GFR: Estimated Creatinine Clearance: 61.5 mL/min (by C-G formula based on SCr of 1.01 mg/dL). Liver Function Tests: No results for input(s): "AST", "ALT", "ALKPHOS", "BILITOT", "PROT", "ALBUMIN" in the last 168 hours. No results for input(s): "LIPASE", "AMYLASE" in the last 168 hours. No results for input(s): "AMMONIA" in the last 168 hours. Coagulation Profile: No results for input(s): "INR", "PROTIME" in the last 168 hours. Cardiac Enzymes: No results for input(s): "CKTOTAL", "CKMB", "CKMBINDEX", "TROPONINI" in the last 168 hours. BNP (last 3 results) No results for input(s): "PROBNP" in the last 8760 hours. HbA1C: No results for input(s): "HGBA1C" in the last 72 hours. CBG: Recent Labs  Lab 06/05/23 0203 06/05/23 0809 06/05/23 1148 06/05/23 1540 06/05/23 1650  GLUCAP 293* 267* 243* 327* 290*   Lipid Profile: No results for input(s): "CHOL", "HDL", "LDLCALC", "TRIG", "CHOLHDL", "LDLDIRECT" in the last 72 hours. Thyroid Function Tests: No results for input(s): "TSH", "T4TOTAL", "FREET4", "T3FREE", "THYROIDAB" in the last 72 hours. Anemia Panel: No results for input(s): "VITAMINB12", "FOLATE", "FERRITIN", "TIBC", "IRON", "RETICCTPCT" in the last 72 hours. Sepsis Labs: Recent Labs   Lab 05/31/23 1017 05/31/23 1123  LATICACIDVEN 2.6* 2.6*    Recent Results (from the past 240 hour(s))  SARS Coronavirus 2 by RT PCR (hospital order, performed in [facility] hospital lab) *cepheid single result test* Anterior Nasal Swab     Status: None   Collection Time: 05/31/23  9:53 AM   Specimen: Anterior Nasal Swab  Result Value Ref Range Status   SARS Coronavirus 2 by RT PCR NEGATIVE NEGATIVE Final    Comment: (NOTE) SARS-CoV-2 target nucleic acids are NOT DETECTED.  The SARS-CoV-2 RNA is generally detectable in upper and lower respiratory specimens during the acute phase of infection. The lowest concentration of SARS-CoV-2 viral copies this assay can detect is 250 copies / mL. A negative result does not preclude SARS-CoV-2 infection and should not be used as the sole basis for treatment or other patient management decisions.  A negative result may occur with improper specimen collection / handling, submission of specimen other than nasopharyngeal swab, presence of viral mutation(s) within the areas targeted by this assay, and inadequate number of viral copies (<250 copies / mL). A  negative result must be combined with clinical observations, patient history, and epidemiological information.  Fact Sheet for Patients:   RoadLapTop.co.za  Fact Sheet for Healthcare Providers: http://kim-miller.com/  This test is not yet approved or  cleared by the [place] FDA and has been authorized for detection and/or diagnosis of SARS-CoV-2 by FDA under an Emergency Use Authorization (EUA).  This EUA will remain in effect (meaning this test can be used) for the duration of the COVID-19 declaration under Section 564(b)(1) of the Act, 21 U.S.C. section 360bbb-3(b)(1), unless the authorization is terminated or revoked sooner.  Performed at [facility], [address], [place], [place] [address]   Culture, blood (routine x 2)     Status:  None   Collection Time: 05/31/23 10:16 AM   Specimen: Right Antecubital; Blood  Result Value Ref Range Status   Specimen Description   Final    RIGHT ANTECUBITAL BOTTLES DRAWN AEROBIC AND ANAEROBIC   Special Requests Blood Culture adequate volume  Final   Culture   Final    NO GROWTH 5 DAYS Performed at [facility], [address], [place], [place] [address]    Report Status 06/05/2023 FINAL  Final  Culture, blood (routine x 2)     Status: None   Collection Time: 05/31/23 10:17 AM   Specimen: BLOOD RIGHT HAND  Result Value Ref Range Status   Specimen Description   Final    BLOOD RIGHT HAND BOTTLES DRAWN AEROBIC AND ANAEROBIC   Special Requests Blood Culture adequate volume  Final   Culture   Final    NO GROWTH 5 DAYS Performed at [facility], [address] [place], [place] [address]    Report Status 06/05/2023 FINAL  Final  MRSA Next Gen by PCR, Nasal     Status: None   Collection Time: 06/01/23 10:23 AM   Specimen: Nasal Mucosa; Nasal Swab  Result Value Ref Range Status   MRSA by PCR Next Gen NOT DETECTED NOT DETECTED Final    Comment: (NOTE) The GeneXpert MRSA Assay (FDA approved for NASAL specimens only), is one component of a comprehensive MRSA colonization surveillance program. It is not intended to diagnose MRSA infection nor to guide or monitor treatment for MRSA infections. Test performance is not FDA approved in patients less than 52 years old. Performed at [facility], [address] [place], [place] [address]          Radiology Studies: No results found.      Scheduled Meds:  arformoterol  15 mcg Nebulization BID   budesonide (PULMICORT) nebulizer solution  0.25 mg Nebulization BID   Chlorhexidine Gluconate Cloth  6 each Topical QHS   dextromethorphan-guaiFENesin  1 tablet Oral BID   enoxaparin (LOVENOX) injection  40 mg Subcutaneous Q24H   gabapentin  300 mg Oral TID   insulin aspart  0-9 Units Subcutaneous TID WC    mirtazapine  7.5 mg Oral QHS   predniSONE  40 mg Oral Q breakfast   revefenacin  175 mcg Nebulization Daily   sodium chloride flush  3 mL Intravenous Q12H   Continuous Infusions:     LOS: 5 days     Briant Cedar, MD [facility] Available via Epic secure chat 7am-7pm After these hours, please refer to coverage provider listed on [email or web address] 06/05/2023, 6:46 PM

## 2023-06-06 DIAGNOSIS — J9621 Acute and chronic respiratory failure with hypoxia: Secondary | ICD-10-CM | POA: Diagnosis not present

## 2023-06-06 LAB — GLUCOSE, CAPILLARY
Glucose-Capillary: 274 mg/dL — ABNORMAL HIGH (ref 70–99)
Glucose-Capillary: 397 mg/dL — ABNORMAL HIGH (ref 70–99)
Glucose-Capillary: 475 mg/dL — ABNORMAL HIGH (ref 70–99)

## 2023-06-06 MED ORDER — ORAL CARE MOUTH RINSE
15.0000 mL | OROMUCOSAL | Status: DC
  Administered 2023-06-06: 15 mL via OROMUCOSAL

## 2023-06-06 MED ORDER — POLYETHYLENE GLYCOL 3350 17 G PO PACK: 17.0000 g | PACK | Freq: Every day | ORAL | 0 refills | Status: AC | PRN

## 2023-06-06 MED ORDER — ONDANSETRON 4 MG PO TBDP: 4.0000 mg | ORAL_TABLET | Freq: Three times a day (TID) | ORAL | 0 refills | Status: AC | PRN

## 2023-06-06 MED ORDER — OXYCODONE HCL 5 MG PO CAPS: 5.0000 mg | ORAL_CAPSULE | ORAL | 0 refills | Status: DC | PRN

## 2023-06-06 MED ORDER — ACETAMINOPHEN 325 MG PO TABS: 650.0000 mg | ORAL_TABLET | Freq: Four times a day (QID) | ORAL | 0 refills | Status: AC | PRN

## 2023-06-06 MED ORDER — ORAL CARE MOUTH RINSE: 15.0000 mL | OROMUCOSAL | Status: DC | PRN

## 2023-06-06 MED ORDER — TRAZODONE HCL 50 MG PO TABS: 50.0000 mg | ORAL_TABLET | Freq: Every evening | ORAL | 0 refills | Status: AC | PRN

## 2023-06-06 MED ORDER — LORAZEPAM 0.5 MG PO TABS: 0.5000 mg | ORAL_TABLET | Freq: Three times a day (TID) | ORAL | 0 refills | Status: AC | PRN

## 2023-06-06 MED ORDER — GABAPENTIN 300 MG PO CAPS: 300.0000 mg | ORAL_CAPSULE | Freq: Three times a day (TID) | ORAL | Status: AC

## 2023-06-06 MED ORDER — DM-GUAIFENESIN ER 30-600 MG PO TB12: 1.0000 | ORAL_TABLET | Freq: Two times a day (BID) | ORAL | 0 refills | Status: AC | PRN

## 2023-06-06 NOTE — Progress Notes (Signed)
  Daily Progress Note   Patient Name: Daryl Vega [facility]       Date: 06/06/2023 DOB: March 05, 1946  Age: 77 y.o. MRN#: [id number] Attending Physician: Briant Cedar, MD Primary Care Physician: Loyola Mast, NP Admit Date: 05/31/2023 Length of Stay: 6 days   Discussed care with IDT to coordinate care. When speaking with [facility] hospice liaison today, patient's DME should be delivered by 2PM today. Informed hosptialist and TOC of this to assist with coordination of discharge. Please discharge with comfort focused medications to have at home until hospice can take over management. Hopeful patient can return home today with hospice. Palliative medicine team will continue to follow along with patient's journey. Thank you.   Alvester Morin, DO Palliative Care Provider PMT # [phone]

## 2023-06-06 NOTE — TOC Transition Note (Addendum)
Transition of Care [facility]) - CM/SW Discharge Note   Patient Details  Name: Daryl Vega MRN: [id number] Date of Birth: 1946-11-23  Transition of Care [facility]) CM/SW Contact:  Adrian Prows, RN Phone Number: 06/06/2023, 1:04 PM   Clinical Narrative:    D/C orders received; pt will d/c home w/ [facility]; spoke w/ Jamal Maes at agency; he says pt will be transported by [facility]; spoke w/ pt's son Sasan Wilkie [facility]; he confirms hospital bed and oxygen will be delivered at 2 PM; he also confirmed d/c address: [address] [place], [place] [address]; he will call this CM when dme has arrived; will call [facility] at that time.  - 1458- called pt's son; he says DME has not been delivered; LVM for Trena Platt, Liaison at [facility]; awaiting return call.  -1520- LVM for Trena Platt  -1554- called pt's son Branden; he says delivery driver called and said dme will be delivered 4:00 and 4:30  -1556- notified Trena Platt of status of delivery; she requested contact when [facility] called  -1621- [facility] called; spoke w/ operator # [id number]; no TOC needs  -1626- LVM for Victorino Dike that Sharin Mons has been called; also left her contact # for Jewel Baize, RN for notification of [facility] arrival; no TOC needs. Final next level of care: Home w Hospice Care Barriers to Discharge: No Barriers Identified   Patient Goals and CMS Choice      Discharge Placement                    Name of family member notified: Montario Zilka (son) [phone] Patient and family notified of of transfer: 06/06/23  Discharge Plan and Services Additional resources added to the After Visit Summary for                                       Social Determinants of Health (SDOH) Interventions SDOH Screenings   Food Insecurity: Patient Unable To Answer (05/31/2023)  Housing: High Risk (05/31/2023)  Transportation Needs: Patient Unable To Answer (05/31/2023)  Utilities: Patient Unable To Answer  (05/31/2023)  Tobacco Use: Medium Risk (05/31/2023)     Readmission Risk Interventions     No data to display

## 2023-06-06 NOTE — Discharge Summary (Signed)
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 male with past medical history of prior PE, recurrent lung cancer, melanoma, DM2, HLD, chronic hypoxic respiratory failure on at least 5 L of oxygen via nasal cannula at home presents by EMS on 05/31/2023 with worsening respiratory failure and increased oxygen requirement. 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 Pt admitted for further management.     Today, pt stable to be discharged home with hospice. Discussed with son about d/c plans. DME hospital bed and O2 tank should be delivered before 2 pm.    Assessment and Plan:  Recurrent Lung Cancer with progressive non-occlusive tumor thrombus invading the right main pulmonary artery Acute on chronic Hypoxic Resp Failure PTA patient was on over 5 L of oxygen via nasal cannula at home, now on RA CTA chest mentioned above Radiation oncology consulted, not a candidate for radiation  therapy as patient unable to stay still to significant agitation/confusion.  Also noted risk of massive bleeding with radiation Home with hospice Supplemental O2 prn   Possible acute COPD exacerbation In the setting of #1 above Completed azithromycin/Rocephin, prednisone, bronchodilators, mucolytics   Acute on chronic systolic and diastolic HF BNP 4098 CT chest showing pulmonary edema Echo done with EF of 25%, global hypokinesis, small pericardial effusion S/p IV lasix, diuresed ~ 10L Due to overall poor prognosis, patient switching over to hospice, no further management   Acute metabolic encephalopathy Likely multifactorial, as noted above Improving UA negative for any infection Unable to lay still for CT head  Delirium precautions   DM2-recent A1c 5.9 Uncontrolled likely 2/2 steroid use (now discontinued) Glipizide   Anemia of chronic disease chronic anemia Malignancy/chronic illness as well as iron deficiency   History of PE (September 2016 )--- patient with underlying malignancy High risk for DVT and PE---CTA chest on 05/31/2023 without acute PE at this time Dr. Craige Cotta recommending against full anticoagulation, due to risk of massive bleeding             Consultants: PCCM, Rad Onc Procedures performed: None Disposition: Hospice care Diet recommendation:  Regular diet   DISCHARGE MEDICATION: Allergies as of 06/06/2023   No Known Allergies      Medication List     STOP taking these medications    amoxicillin-clavulanate 875-125 MG tablet Commonly known as: AUGMENTIN   ferrous sulfate 324 (65 Fe) MG Tbec   rosuvastatin 20 MG  tablet Commonly known as: CRESTOR   traMADol 50 MG tablet Commonly known as: ULTRAM   Trelegy Ellipta 100-62.5-25 MCG/ACT Aepb Generic drug: Fluticasone-Umeclidin-Vilant       TAKE these medications    acetaminophen 325 MG tablet Commonly known as: TYLENOL Take 2 tablets (650 mg total) by mouth every 6 (six) hours as  needed for mild pain (or Fever >/= 101).   albuterol 108 (90 Base) MCG/ACT inhaler Commonly known as: VENTOLIN HFA Inhale 2 puffs into the lungs every 4 (four) hours as needed. What changed: reasons to take this   albuterol (2.5 MG/3ML) 0.083% nebulizer solution Commonly known as: PROVENTIL Take 3 mLs (2.5 mg total) by nebulization every 4 (four) hours as needed for wheezing or shortness of breath. What changed: Another medication with the same name was changed. Make sure you understand how and when to take each.   BREO ELLIPTA IN Inhale 2 puffs into the lungs 2 (two) times daily. Pt gets samples from his Doctor.   dextromethorphan-guaiFENesin 30-600 MG 12hr tablet Commonly known as: MUCINEX DM Take 1 tablet by mouth 2 (two) times daily as needed for up to 7 days for cough.   gabapentin 300 MG capsule Commonly known as: NEURONTIN Take 1 capsule (300 mg total) by mouth 3 (three) times daily. What changed: how much to take   glipiZIDE 10 MG 24 hr tablet Commonly known as: GLUCOTROL XL Take 1 tablet (10 mg total) by mouth daily with breakfast.   LORazepam 0.5 MG tablet Commonly known as: Ativan Take 1 tablet (0.5 mg total) by mouth every 8 (eight) hours as needed for anxiety.   metFORMIN 500 MG tablet Commonly known as: GLUCOPHAGE Take 500 mg by mouth 3 (three) times daily. What changed: Another medication with the same name was removed. Continue taking this medication, and follow the directions you see here.   mirtazapine 7.5 MG tablet Commonly known as: REMERON Take 7.5 mg by mouth at bedtime.   ondansetron 4 MG disintegrating tablet Commonly known as: ZOFRAN-ODT Take 1 tablet (4 mg total) by mouth every 8 (eight) hours as needed for nausea or vomiting.   oxycodone 5 MG capsule Commonly known as: OXY-IR Take 1 capsule (5 mg total) by mouth every 4 (four) hours as needed for up to 7 days.   polyethylene glycol 17 g packet Commonly known as: MIRALAX / GLYCOLAX Take 17  g by mouth daily as needed for mild constipation.   traZODone 50 MG tablet Commonly known as: DESYREL Take 1 tablet (50 mg total) by mouth at bedtime as needed for sleep.               Durable Medical Equipment  (From admission, onward)           Start     Ordered   06/05/23 1223  For home use only DME oxygen  Once       Question Answer Comment  Length of Need Lifetime   Mode or (Route) Nasal cannula   Liters per Minute 4   Frequency Continuous (stationary and portable oxygen unit needed)   Oxygen delivery system Gas      06/05/23 1222   06/05/23 1222  For home use only DME Hospital bed  Once       Question Answer Comment  Length of Need Lifetime   Patient has (list medical condition): Recurrent metastatic lung CA   The above medical condition requires: Patient requires the ability to reposition frequently   Bed type  Semi-electric      06/05/23 1222            Discharge Exam: Filed Weights   05/31/23 0923 05/31/23 1730 06/06/23 0700  Weight: 77.1 kg 68.8 kg 57.1 kg   General: NAD, chronically ill appearing Cardiovascular: S1, S2 present Respiratory: CTAB Abdomen: Soft, nontender, nondistended, bowel sounds present Musculoskeletal: No bilateral pedal edema noted Skin: Normal Psychiatry: poor mood  Condition at discharge: fair  The results of significant diagnostics from this hospitalization (including imaging, microbiology, ancillary and laboratory) are listed below for reference.   Imaging Studies: ECHOCARDIOGRAM LIMITED  Result Date: 05/31/2023    ECHOCARDIOGRAM REPORT   Patient Name:   JETTSON CRABLE Morrisette Date of Exam: 05/31/2023 Medical Rec #:  [id number]               Height:       69.0 in Accession #:    [id number]              Weight:       170.0 lb Date of Birth:  1946/05/08               BSA:          1.928 m Patient Age:    76 years                BP:           104/66 mmHg Patient Gender: M                       HR:           121 bpm. Exam  Location:  [facility] Procedure: Limited Echo, Limited Color Doppler and Cardiac Doppler Indications:    Dyspnea R06.00  History:        Patient has prior history of Echocardiogram examinations, most                 recent 05/03/2020. COPD; Risk Factors:Diabetes, Dyslipidemia and                 Former Smoker. Hx of lung cancer.  Sonographer:    Celesta Gentile RCS Referring Phys: [phone] [facility]  Sonographer Comments: No parasternal window and suboptimal apical window. EXTREMELY difficult echo. IMPRESSIONS  1. "B" lines noted on chest ultrasound, suggestive of pulmonary edema.  2. Left ventricular ejection fraction, by estimation, is 25%. The left ventricle has severely decreased function. The left ventricle demonstrates global hypokinesis. Left ventricular diastolic parameters are indeterminate.  3. Right ventricular systolic function is severely reduced. The right ventricular size is moderately enlarged. There is normal pulmonary artery systolic pressure. The estimated right ventricular systolic pressure is 29.3 mmHg.  4. Left atrial size was mildly dilated.  5. Right atrial size was severely dilated.  6. A small pericardial effusion is present. The pericardial effusion is surrounding the apex.  7. The mitral valve is degenerative. No evidence of mitral valve regurgitation.  8. The aortic valve is grossly normal. Aortic valve regurgitation is not visualized. No aortic stenosis is present.  9. The inferior vena cava is dilated in size with <50% respiratory variability, suggesting right atrial pressure of 15 mmHg. FINDINGS  Left Ventricle: Left ventricular ejection fraction, by estimation, is 25%. The left ventricle has severely decreased function. The left ventricle demonstrates global hypokinesis. The left ventricular internal cavity size was normal in size. Suboptimal image quality limits for assessment of left ventricular hypertrophy. Left ventricular diastolic parameters are  indeterminate. Right Ventricle:  The right ventricular size is moderately enlarged. Right vetricular wall thickness was not well visualized. Right ventricular systolic function is severely reduced. There is normal pulmonary artery systolic pressure. The tricuspid regurgitant velocity is 1.89 m/s, and with an assumed right atrial pressure of 15 mmHg, the estimated right ventricular systolic pressure is 29.3 mmHg. Left Atrium: Left atrial size was mildly dilated. Right Atrium: Right atrial size was severely dilated. Pericardium: A small pericardial effusion is present. The pericardial effusion is surrounding the apex. Presence of epicardial fat layer. Mitral Valve: The mitral valve is degenerative in appearance. No evidence of mitral valve regurgitation. Tricuspid Valve: The tricuspid valve is grossly normal. Tricuspid valve regurgitation is mild. Aortic Valve: The aortic valve is grossly normal. Aortic valve regurgitation is not visualized. No aortic stenosis is present. Pulmonic Valve: The pulmonic valve was not well visualized. Pulmonic valve regurgitation is not visualized. Aorta: Aortic root could not be assessed. Venous: The inferior vena cava is dilated in size with less than 50% respiratory variability, suggesting right atrial pressure of 15 mmHg. IAS/Shunts: The atrial septum is grossly normal.  AORTIC VALVE LVOT Vmax:   68.40 cm/s LVOT Vmean:  49.300 cm/s LVOT VTI:    0.105 m MITRAL VALVE               TRICUSPID VALVE MV Area (PHT): 6.96 cm    TR Peak grad:   14.3 mmHg MV Decel Time: 109 msec    TR Vmax:        189.00 cm/s MV E velocity: 81.90 cm/s                            SHUNTS                            Systemic VTI: 0.10 m Weston Brass MD Electronically signed by Weston Brass MD Signature Date/Time: 05/31/2023/2:54:48 PM    Final    CT Angio Chest PE W and/or Wo Contrast  Result Date: 05/31/2023 CLINICAL DATA:  Pulmonary embolism suspected, high probability. Increasing shortness of breath beginning yesterday. Hypoxia. EXAM:  CT ANGIOGRAPHY CHEST WITH CONTRAST TECHNIQUE: Multidetector CT imaging of the chest was performed using the standard protocol during bolus administration of intravenous contrast. Multiplanar CT image reconstructions and MIPs were obtained to evaluate the vascular anatomy. RADIATION DOSE REDUCTION: This exam was performed according to the departmental dose-optimization program which includes automated exposure control, adjustment of the mA and/or kV according to patient size and/or use of iterative reconstruction technique. CONTRAST:  75mL OMNIPAQUE IOHEXOL 350 MG/ML SOLN COMPARISON:  CT angio chest 11/24/2022 and 02/12/2023. FINDINGS: Cardiovascular: Heart size is normal. Atherosclerotic calcifications are present the aortic arch and great vessel origins without significant aneurysm, stenosis or change. 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 Tumor is nonocclusive. No distal pulmonary embolus is present. Pulmonary branch vessels are within normal limits. Mediastinum/Nodes: Right paratracheal and hilar mass extends into the superior and anterior aspect of the right main pulmonary artery. The lesion measures at least 23 x 16 x 21 mm. Lungs/Pleura: Extensive architectural changes present in the right lung following previous radiation. Parenchymal changes are stable. Moderate loculated right pleural effusion is present. A left pleural effusion has increased since the prior exam. Areas of dependent atelectasis are present in the left lung. Upper Abdomen: Cholecystectomy noted. Visualized liver is unremarkable. The visualized pancreas and spleen are  within normal limits. A water density lesion in the posterior left kidney is stable measuring 20 mm. Other solid organ lesions are present. Cholecystectomy noted. Musculoskeletal: The vertebral body heights and alignment normal. Review of the MIP images confirms the above findings. IMPRESSION: 1. 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 2. Extensive architectural changes in the right lung following previous radiation. 3. Moderate loculated right pleural effusion. 4. Increased size of left pleural effusion with associated dependent atelectasis. 5. Stable 20 mm water density lesion in the posterior left kidney, likely a cyst. Recommend no imaging follow-up. 6.  Aortic Atherosclerosis (ICD10-I70.0). These results were called by telephone at the time of interpretation on 05/31/2023 at 12:23 pm to provider [facility] , who verbally acknowledged these results. Electronically Signed   By: Marin Roberts M.D.   On: 05/31/2023 12:25   DG Chest Port 1 View  Result Date: 05/31/2023 CLINICAL DATA:  Shortness of breath EXAM: PORTABLE CHEST 1 VIEW COMPARISON:  03/18/2023 FINDINGS: Similar appearance of the right lung where there have been extensive post treatment changes with scarring and volume loss. Loculated right pleural effusion. Interval development of extensive interstitial opacity throughout the left lung. No left-sided pleural effusion. Skin fold overlies the periphery of the left hemithorax without evidence of a pneumothorax. IMPRESSION: 1. Interval development of extensive interstitial opacity throughout the left lung, which may represent edema or atypical infection. 2. Similar appearance of the right lung where there have been extensive post treatment changes with scarring and volume loss. Electronically Signed   By: Duanne Guess D.O.   On: 05/31/2023 10:13    Microbiology: Results for orders placed or performed during the hospital encounter of 05/31/23  SARS Coronavirus 2 by RT PCR (hospital order, performed in [facility] hospital lab) *cepheid single result test* Anterior Nasal Swab     Status: None   Collection Time: 05/31/23  9:53 AM   Specimen: Anterior Nasal Swab  Result Value Ref Range Status   SARS Coronavirus 2 by RT PCR NEGATIVE NEGATIVE Final    Comment: (NOTE) SARS-CoV-2 target  nucleic acids are NOT DETECTED.  The SARS-CoV-2 RNA is generally detectable in upper and lower respiratory specimens during the acute phase of infection. The lowest concentration of SARS-CoV-2 viral copies this assay can detect is 250 copies / mL. A negative result does not preclude SARS-CoV-2 infection and should not be used as the sole basis for treatment or other patient management decisions.  A negative result may occur with improper specimen collection / handling, submission of specimen other than nasopharyngeal swab, presence of viral mutation(s) within the areas targeted by this assay, and inadequate number of viral copies (<250 copies / mL). A negative result must be combined with clinical observations, patient history, and epidemiological information.  Fact Sheet for Patients:   RoadLapTop.co.za  Fact Sheet for Healthcare Providers: http://kim-miller.com/  This test is not yet approved or  cleared by the [place] FDA and has been authorized for detection and/or diagnosis of SARS-CoV-2 by FDA under an Emergency Use Authorization (EUA).  This EUA will remain in effect (meaning this test can be used) for the duration of the COVID-19 declaration under Section 564(b)(1) of the Act, 21 U.S.C. section 360bbb-3(b)(1), unless the authorization is terminated or revoked sooner.  Performed at [facility], [address], [place], [place] [address]   Culture, blood (routine x 2)     Status: None   Collection Time: 05/31/23 10:16 AM   Specimen: Right  Antecubital; Blood  Result Value Ref Range Status   Specimen Description   Final    RIGHT ANTECUBITAL BOTTLES DRAWN AEROBIC AND ANAEROBIC   Special Requests Blood Culture adequate volume  Final   Culture   Final    NO GROWTH 5 DAYS Performed at [facility], [address], [place], [place] [address]    Report Status 06/05/2023 FINAL  Final  Culture, blood (routine x 2)     Status: None    Collection Time: 05/31/23 10:17 AM   Specimen: BLOOD RIGHT HAND  Result Value Ref Range Status   Specimen Description   Final    BLOOD RIGHT HAND BOTTLES DRAWN AEROBIC AND ANAEROBIC   Special Requests Blood Culture adequate volume  Final   Culture   Final    NO GROWTH 5 DAYS Performed at [facility], [address] [place], [place] [address]    Report Status 06/05/2023 FINAL  Final  MRSA Next Gen by PCR, Nasal     Status: None   Collection Time: 06/01/23 10:23 AM   Specimen: Nasal Mucosa; Nasal Swab  Result Value Ref Range Status   MRSA by PCR Next Gen NOT DETECTED NOT DETECTED Final    Comment: (NOTE) The GeneXpert MRSA Assay (FDA approved for NASAL specimens only), is one component of a comprehensive MRSA colonization surveillance program. It is not intended to diagnose MRSA infection nor to guide or monitor treatment for MRSA infections. Test performance is not FDA approved in patients less than 12 years old. Performed at [facility], [address] [place], [place] [address]     Labs: CBC: Recent Labs  Lab 05/31/23 1016 06/01/23 0323 06/02/23 0311 06/04/23 0321 06/05/23 0324  WBC 11.7* 4.1 12.7* 11.6* 9.2  NEUTROABS 9.0*  --   --  9.7* 7.6  HGB 12.7* 11.3* 11.8* 13.8 14.7  HCT 40.9 35.8* 37.0* 43.6 44.7  MCV 91.7 90.6 89.2 88.6 87.3  PLT 186 170 161 167 175   Basic Metabolic Panel: Recent Labs  Lab 06/01/23 0323 06/02/23 0307 06/02/23 0311 06/03/23 0814 06/04/23 0321 06/05/23 0324  NA 139  --  139 138 138 135  K 4.9  --  4.2 3.6 4.0 3.5  CL 105  --  105 102 99 96*  CO2 24  --  24 26 29 29   GLUCOSE 220*  --  213* 197* 170* 321*  BUN 34*  --  34* 41* 38* 35*  CREATININE 1.21  --  1.11 1.11 1.07 1.01  CALCIUM 9.0  --  8.7* 8.5* 8.6* 8.5*  MG  --  1.8  --   --   --   --    Liver Function Tests: No results for input(s): "AST", "ALT", "ALKPHOS", "BILITOT", "PROT", "ALBUMIN" in the last 168 hours. CBG: Recent Labs  Lab  06/05/23 1650 06/05/23 2110 06/06/23 0907 06/06/23 1212 06/06/23 1219  GLUCAP 290* 440* 274* 475* 397*    Discharge time spent: greater than 30 minutes.  Signed: Briant Cedar, MD [facility] 06/06/2023

## 2023-06-06 NOTE — Progress Notes (Addendum)
Patient discharged to home via [facility] transportation with care of [facility] taking over care; this nurse called Trena Platt [phone] [facility]) to inform of patient's discharge instructions/[facility] transportation with an ETA of home arrival at 1900. All personal items accounted for, IV removed, patient discharge information given to [facility] to include molst form/discharge instructions, patient dressing in personal clothing. Patient/[facility] did not have any questions/concerns at this time.

## 2023-06-07 LAB — GLUCOSE, CAPILLARY: Glucose-Capillary: 288 mg/dL — ABNORMAL HIGH (ref 70–99)

## 2023-06-09 ENCOUNTER — Other Ambulatory Visit: Payer: Self-pay | Admitting: Nurse Practitioner

## 2023-06-09 MED ORDER — OXYCODONE HCL 5 MG/5ML PO SOLN: 5.0000 mg | Freq: Four times a day (QID) | ORAL | Status: AC | PRN

## 2023-06-09 NOTE — Progress Notes (Signed)
Oxycodone capsules not covered so solution ordered instead
